# Patient Record
Sex: Female | Born: 1935 | Race: White | Hispanic: No | Smoking: Never smoker
Health system: Southern US, Community
[De-identification: ages and names within clinical notes are randomized; demographics above are authoritative.]

## PROBLEM LIST (undated history)

## (undated) DIAGNOSIS — R112 Nausea with vomiting, unspecified: Secondary | ICD-10-CM

## (undated) DIAGNOSIS — E6609 Other obesity due to excess calories: Secondary | ICD-10-CM

## (undated) DIAGNOSIS — I951 Orthostatic hypotension: Secondary | ICD-10-CM

## (undated) DIAGNOSIS — E079 Disorder of thyroid, unspecified: Secondary | ICD-10-CM

## (undated) DIAGNOSIS — E7800 Pure hypercholesterolemia, unspecified: Secondary | ICD-10-CM

## (undated) DIAGNOSIS — I442 Atrioventricular block, complete: Secondary | ICD-10-CM

## (undated) DIAGNOSIS — Z9889 Other specified postprocedural states: Secondary | ICD-10-CM

## (undated) DIAGNOSIS — C50919 Malignant neoplasm of unspecified site of unspecified female breast: Secondary | ICD-10-CM

## (undated) DIAGNOSIS — F22 Delusional disorders: Secondary | ICD-10-CM

## (undated) DIAGNOSIS — I872 Venous insufficiency (chronic) (peripheral): Secondary | ICD-10-CM

## (undated) HISTORY — DX: Disorder of thyroid, unspecified: E07.9

## (undated) HISTORY — DX: Malignant neoplasm of unspecified site of unspecified female breast: C50.919

## (undated) HISTORY — DX: Pure hypercholesterolemia, unspecified: E78.00

## (undated) HISTORY — PX: PACEMAKER INSERTION: SHX728

## (undated) HISTORY — DX: Other obesity due to excess calories: E66.09

## (undated) HISTORY — PX: CHOLECYSTECTOMY: SHX55

## (undated) HISTORY — PX: ABDOMINAL HYSTERECTOMY: SHX81

## (undated) HISTORY — DX: Venous insufficiency (chronic) (peripheral): I87.2

## (undated) HISTORY — DX: Atrioventricular block, complete: I44.2

## (undated) HISTORY — PX: CATARACT EXTRACTION: SUR2

## (undated) HISTORY — DX: Orthostatic hypotension: I95.1

---

## 2000-12-31 ENCOUNTER — Encounter (INDEPENDENT_AMBULATORY_CARE_PROVIDER_SITE_OTHER): Payer: Self-pay | Admitting: *Deleted

## 2000-12-31 ENCOUNTER — Ambulatory Visit (HOSPITAL_COMMUNITY): Admission: RE | Admit: 2000-12-31 | Discharge: 2000-12-31 | Payer: Self-pay | Admitting: Gastroenterology

## 2002-02-05 ENCOUNTER — Encounter: Payer: Self-pay | Admitting: Ophthalmology

## 2002-02-09 ENCOUNTER — Ambulatory Visit (HOSPITAL_COMMUNITY): Admission: RE | Admit: 2002-02-09 | Discharge: 2002-02-09 | Payer: Self-pay | Admitting: Ophthalmology

## 2004-03-06 ENCOUNTER — Ambulatory Visit (HOSPITAL_COMMUNITY): Admission: RE | Admit: 2004-03-06 | Discharge: 2004-03-06 | Payer: Self-pay | Admitting: Ophthalmology

## 2005-03-19 ENCOUNTER — Encounter: Admission: RE | Admit: 2005-03-19 | Discharge: 2005-03-19 | Payer: Self-pay | Admitting: Endocrinology

## 2005-04-16 ENCOUNTER — Encounter (INDEPENDENT_AMBULATORY_CARE_PROVIDER_SITE_OTHER): Payer: Self-pay | Admitting: Specialist

## 2005-04-17 ENCOUNTER — Inpatient Hospital Stay (HOSPITAL_COMMUNITY): Admission: RE | Admit: 2005-04-17 | Discharge: 2005-04-18 | Payer: Self-pay | Admitting: Surgery

## 2006-11-19 ENCOUNTER — Observation Stay (HOSPITAL_COMMUNITY): Admission: EM | Admit: 2006-11-19 | Discharge: 2006-11-20 | Payer: Self-pay | Admitting: Emergency Medicine

## 2007-03-30 DIAGNOSIS — C50919 Malignant neoplasm of unspecified site of unspecified female breast: Secondary | ICD-10-CM

## 2007-03-30 HISTORY — DX: Malignant neoplasm of unspecified site of unspecified female breast: C50.919

## 2007-04-03 ENCOUNTER — Encounter: Admission: RE | Admit: 2007-04-03 | Discharge: 2007-04-03 | Payer: Self-pay | Admitting: Endocrinology

## 2007-04-07 ENCOUNTER — Encounter: Admission: RE | Admit: 2007-04-07 | Discharge: 2007-04-07 | Payer: Self-pay | Admitting: Endocrinology

## 2007-04-07 ENCOUNTER — Encounter (INDEPENDENT_AMBULATORY_CARE_PROVIDER_SITE_OTHER): Payer: Self-pay | Admitting: Diagnostic Radiology

## 2007-04-17 ENCOUNTER — Encounter: Admission: RE | Admit: 2007-04-17 | Discharge: 2007-04-17 | Payer: Self-pay | Admitting: Endocrinology

## 2007-04-22 ENCOUNTER — Encounter (INDEPENDENT_AMBULATORY_CARE_PROVIDER_SITE_OTHER): Payer: Self-pay | Admitting: Diagnostic Radiology

## 2007-04-22 ENCOUNTER — Encounter: Admission: RE | Admit: 2007-04-22 | Discharge: 2007-04-22 | Payer: Self-pay | Admitting: Endocrinology

## 2007-04-27 ENCOUNTER — Encounter: Admission: RE | Admit: 2007-04-27 | Discharge: 2007-04-27 | Payer: Self-pay | Admitting: Surgery

## 2007-04-30 HISTORY — PX: BREAST LUMPECTOMY: SHX2

## 2007-04-30 HISTORY — PX: OTHER SURGICAL HISTORY: SHX169

## 2007-05-01 ENCOUNTER — Encounter: Admission: RE | Admit: 2007-05-01 | Discharge: 2007-05-01 | Payer: Self-pay | Admitting: Surgery

## 2007-05-05 ENCOUNTER — Encounter (INDEPENDENT_AMBULATORY_CARE_PROVIDER_SITE_OTHER): Payer: Self-pay | Admitting: Surgery

## 2007-05-05 ENCOUNTER — Ambulatory Visit (HOSPITAL_BASED_OUTPATIENT_CLINIC_OR_DEPARTMENT_OTHER): Admission: RE | Admit: 2007-05-05 | Discharge: 2007-05-05 | Payer: Self-pay | Admitting: Surgery

## 2007-05-05 ENCOUNTER — Encounter: Admission: RE | Admit: 2007-05-05 | Discharge: 2007-05-05 | Payer: Self-pay | Admitting: Surgery

## 2007-05-20 ENCOUNTER — Ambulatory Visit: Payer: Self-pay | Admitting: Oncology

## 2007-06-03 LAB — CBC WITH DIFFERENTIAL/PLATELET
BASO%: 0.8 % (ref 0.0–2.0)
Basophils Absolute: 0.1 10*3/uL (ref 0.0–0.1)
EOS%: 6.5 % (ref 0.0–7.0)
HCT: 43.3 % (ref 34.8–46.6)
HGB: 14 g/dL (ref 11.6–15.9)
MCH: 29.8 pg (ref 26.0–34.0)
MCHC: 32.3 g/dL (ref 32.0–36.0)
MONO#: 0.6 10*3/uL (ref 0.1–0.9)
NEUT%: 61.1 % (ref 39.6–76.8)
RDW: 13.9 % (ref 11.3–14.5)
WBC: 7.7 10*3/uL (ref 3.9–10.0)
lymph#: 1.9 10*3/uL (ref 0.9–3.3)

## 2007-06-03 LAB — COMPREHENSIVE METABOLIC PANEL
ALT: 11 U/L (ref 0–35)
AST: 14 U/L (ref 0–37)
Albumin: 4.3 g/dL (ref 3.5–5.2)
CO2: 27 mEq/L (ref 19–32)
Calcium: 9.8 mg/dL (ref 8.4–10.5)
Chloride: 104 mEq/L (ref 96–112)
Potassium: 3.9 mEq/L (ref 3.5–5.3)

## 2007-06-03 LAB — LACTATE DEHYDROGENASE: LDH: 170 U/L (ref 94–250)

## 2007-06-09 ENCOUNTER — Ambulatory Visit (HOSPITAL_COMMUNITY): Admission: RE | Admit: 2007-06-09 | Discharge: 2007-06-09 | Payer: Self-pay | Admitting: Oncology

## 2007-06-16 ENCOUNTER — Encounter: Admission: RE | Admit: 2007-06-16 | Discharge: 2007-06-16 | Payer: Self-pay | Admitting: Oncology

## 2007-06-17 ENCOUNTER — Encounter: Admission: RE | Admit: 2007-06-17 | Discharge: 2007-06-17 | Payer: Self-pay | Admitting: Endocrinology

## 2007-06-18 ENCOUNTER — Ambulatory Visit (HOSPITAL_BASED_OUTPATIENT_CLINIC_OR_DEPARTMENT_OTHER): Admission: RE | Admit: 2007-06-18 | Discharge: 2007-06-18 | Payer: Self-pay | Admitting: Surgery

## 2007-06-18 ENCOUNTER — Encounter (INDEPENDENT_AMBULATORY_CARE_PROVIDER_SITE_OTHER): Payer: Self-pay | Admitting: Surgery

## 2007-07-02 ENCOUNTER — Ambulatory Visit: Admission: RE | Admit: 2007-07-02 | Discharge: 2007-09-13 | Payer: Self-pay | Admitting: Radiation Oncology

## 2007-07-21 ENCOUNTER — Ambulatory Visit: Payer: Self-pay | Admitting: Oncology

## 2007-07-27 ENCOUNTER — Ambulatory Visit (HOSPITAL_COMMUNITY): Admission: RE | Admit: 2007-07-27 | Discharge: 2007-07-27 | Payer: Self-pay | Admitting: Oncology

## 2007-07-31 ENCOUNTER — Ambulatory Visit (HOSPITAL_COMMUNITY): Admission: RE | Admit: 2007-07-31 | Discharge: 2007-07-31 | Payer: Self-pay | Admitting: Oncology

## 2007-09-17 ENCOUNTER — Inpatient Hospital Stay (HOSPITAL_COMMUNITY): Admission: RE | Admit: 2007-09-17 | Discharge: 2007-09-23 | Payer: Self-pay | Admitting: Urology

## 2007-09-17 ENCOUNTER — Encounter (INDEPENDENT_AMBULATORY_CARE_PROVIDER_SITE_OTHER): Payer: Self-pay | Admitting: Urology

## 2007-09-18 ENCOUNTER — Ambulatory Visit: Payer: Self-pay | Admitting: Physical Medicine & Rehabilitation

## 2007-10-05 ENCOUNTER — Ambulatory Visit: Payer: Self-pay | Admitting: Oncology

## 2007-10-07 LAB — CBC WITH DIFFERENTIAL/PLATELET
Basophils Absolute: 0 10*3/uL (ref 0.0–0.1)
EOS%: 4 % (ref 0.0–7.0)
Eosinophils Absolute: 0.3 10*3/uL (ref 0.0–0.5)
HCT: 38.6 % (ref 34.8–46.6)
HGB: 12.9 g/dL (ref 11.6–15.9)
MCH: 30.4 pg (ref 26.0–34.0)
MONO#: 0.9 10*3/uL (ref 0.1–0.9)
NEUT#: 5.9 10*3/uL (ref 1.5–6.5)
NEUT%: 74.9 % (ref 39.6–76.8)
RDW: 14.1 % (ref 11.3–14.5)
WBC: 7.9 10*3/uL (ref 3.9–10.0)
lymph#: 0.8 10*3/uL — ABNORMAL LOW (ref 0.9–3.3)

## 2007-10-07 LAB — COMPREHENSIVE METABOLIC PANEL
AST: 11 U/L (ref 0–37)
Albumin: 3.5 g/dL (ref 3.5–5.2)
BUN: 16 mg/dL (ref 6–23)
CO2: 23 mEq/L (ref 19–32)
Calcium: 8.6 mg/dL (ref 8.4–10.5)
Chloride: 104 mEq/L (ref 96–112)
Creatinine, Ser: 1.16 mg/dL (ref 0.40–1.20)
Glucose, Bld: 101 mg/dL — ABNORMAL HIGH (ref 70–99)
Potassium: 4.3 mEq/L (ref 3.5–5.3)

## 2007-10-08 ENCOUNTER — Inpatient Hospital Stay (HOSPITAL_COMMUNITY): Admission: EM | Admit: 2007-10-08 | Discharge: 2007-10-14 | Payer: Self-pay | Admitting: Emergency Medicine

## 2007-10-20 ENCOUNTER — Observation Stay (HOSPITAL_COMMUNITY): Admission: AD | Admit: 2007-10-20 | Discharge: 2007-10-21 | Payer: Self-pay | Admitting: Cardiology

## 2007-11-12 LAB — CBC WITH DIFFERENTIAL/PLATELET
BASO%: 0.5 % (ref 0.0–2.0)
EOS%: 9.1 % — ABNORMAL HIGH (ref 0.0–7.0)
HCT: 39.2 % (ref 34.8–46.6)
MCH: 30.3 pg (ref 26.0–34.0)
MCHC: 33.4 g/dL (ref 32.0–36.0)
NEUT%: 62.5 % (ref 39.6–76.8)
RDW: 14.9 % — ABNORMAL HIGH (ref 11.3–14.5)
lymph#: 1.1 10*3/uL (ref 0.9–3.3)

## 2007-11-13 LAB — COMPREHENSIVE METABOLIC PANEL
ALT: 10 U/L (ref 0–35)
AST: 12 U/L (ref 0–37)
Alkaline Phosphatase: 91 U/L (ref 39–117)
Calcium: 8.8 mg/dL (ref 8.4–10.5)
Chloride: 108 mEq/L (ref 96–112)
Creatinine, Ser: 1.23 mg/dL — ABNORMAL HIGH (ref 0.40–1.20)

## 2007-11-13 LAB — VITAMIN D 25 HYDROXY (VIT D DEFICIENCY, FRACTURES): Vit D, 25-Hydroxy: 31 ng/mL (ref 30–89)

## 2008-01-15 ENCOUNTER — Ambulatory Visit: Payer: Self-pay | Admitting: Oncology

## 2008-01-19 LAB — CBC WITH DIFFERENTIAL/PLATELET
Basophils Absolute: 0 10*3/uL (ref 0.0–0.1)
EOS%: 5.8 % (ref 0.0–7.0)
HGB: 14.2 g/dL (ref 11.6–15.9)
MCH: 30.8 pg (ref 26.0–34.0)
MCV: 92 fL (ref 81.0–101.0)
MONO%: 6.4 % (ref 0.0–13.0)
NEUT%: 73.7 % (ref 39.6–76.8)
RDW: 14.2 % (ref 11.3–14.5)

## 2008-01-20 LAB — COMPREHENSIVE METABOLIC PANEL
AST: 11 U/L (ref 0–37)
Alkaline Phosphatase: 92 U/L (ref 39–117)
BUN: 20 mg/dL (ref 6–23)
Creatinine, Ser: 1.21 mg/dL — ABNORMAL HIGH (ref 0.40–1.20)
Total Bilirubin: 0.4 mg/dL (ref 0.3–1.2)

## 2008-01-20 LAB — CANCER ANTIGEN 27.29: CA 27.29: 15 U/mL (ref 0–39)

## 2008-01-20 LAB — VITAMIN D 25 HYDROXY (VIT D DEFICIENCY, FRACTURES): Vit D, 25-Hydroxy: 29 ng/mL — ABNORMAL LOW (ref 30–89)

## 2008-03-16 ENCOUNTER — Ambulatory Visit (HOSPITAL_COMMUNITY): Admission: RE | Admit: 2008-03-16 | Discharge: 2008-03-16 | Payer: Self-pay | Admitting: Urology

## 2008-04-05 ENCOUNTER — Encounter: Admission: RE | Admit: 2008-04-05 | Discharge: 2008-04-05 | Payer: Self-pay | Admitting: Oncology

## 2008-07-15 ENCOUNTER — Ambulatory Visit: Payer: Self-pay | Admitting: Oncology

## 2008-07-19 LAB — CBC WITH DIFFERENTIAL/PLATELET
Basophils Absolute: 0 10*3/uL (ref 0.0–0.1)
Eosinophils Absolute: 0.5 10*3/uL (ref 0.0–0.5)
HGB: 14.1 g/dL (ref 11.6–15.9)
MCV: 91.2 fL (ref 79.5–101.0)
MONO%: 8.2 % (ref 0.0–14.0)
NEUT#: 4.6 10*3/uL (ref 1.5–6.5)
RDW: 13.9 % (ref 11.2–14.5)

## 2008-07-20 LAB — COMPREHENSIVE METABOLIC PANEL
Albumin: 4.1 g/dL (ref 3.5–5.2)
Alkaline Phosphatase: 102 U/L (ref 39–117)
BUN: 19 mg/dL (ref 6–23)
Calcium: 9.7 mg/dL (ref 8.4–10.5)
Chloride: 106 mEq/L (ref 96–112)
Glucose, Bld: 99 mg/dL (ref 70–99)
Potassium: 4.3 mEq/L (ref 3.5–5.3)

## 2008-07-20 LAB — CANCER ANTIGEN 27.29: CA 27.29: 12 U/mL (ref 0–39)

## 2008-10-06 ENCOUNTER — Ambulatory Visit (HOSPITAL_COMMUNITY): Admission: RE | Admit: 2008-10-06 | Discharge: 2008-10-06 | Payer: Self-pay | Admitting: Urology

## 2008-10-06 ENCOUNTER — Encounter (INDEPENDENT_AMBULATORY_CARE_PROVIDER_SITE_OTHER): Payer: Self-pay | Admitting: Interventional Radiology

## 2009-01-26 ENCOUNTER — Ambulatory Visit: Payer: Self-pay | Admitting: Oncology

## 2009-01-30 LAB — CBC WITH DIFFERENTIAL/PLATELET
BASO%: 0.2 % (ref 0.0–2.0)
EOS%: 4.3 % (ref 0.0–7.0)
Eosinophils Absolute: 0.3 10*3/uL (ref 0.0–0.5)
LYMPH%: 15.9 % (ref 14.0–49.7)
MCHC: 33.8 g/dL (ref 31.5–36.0)
MCV: 94.8 fL (ref 79.5–101.0)
MONO%: 7.3 % (ref 0.0–14.0)
NEUT#: 5.8 10*3/uL (ref 1.5–6.5)
RBC: 4.36 10*6/uL (ref 3.70–5.45)
RDW: 13.6 % (ref 11.2–14.5)

## 2009-01-31 LAB — COMPREHENSIVE METABOLIC PANEL
ALT: 11 U/L (ref 0–35)
AST: 11 U/L (ref 0–37)
Albumin: 4.3 g/dL (ref 3.5–5.2)
Alkaline Phosphatase: 101 U/L (ref 39–117)
Potassium: 4.3 mEq/L (ref 3.5–5.3)
Sodium: 144 mEq/L (ref 135–145)
Total Bilirubin: 0.3 mg/dL (ref 0.3–1.2)
Total Protein: 7 g/dL (ref 6.0–8.3)

## 2009-01-31 LAB — CANCER ANTIGEN 27.29: CA 27.29: 9 U/mL (ref 0–39)

## 2009-03-21 ENCOUNTER — Ambulatory Visit: Payer: Self-pay | Admitting: Oncology

## 2009-03-27 LAB — VITAMIN D 25 HYDROXY (VIT D DEFICIENCY, FRACTURES): Vit D, 25-Hydroxy: 45 ng/mL (ref 30–89)

## 2009-04-10 ENCOUNTER — Encounter: Admission: RE | Admit: 2009-04-10 | Discharge: 2009-04-10 | Payer: Self-pay | Admitting: Oncology

## 2009-06-19 ENCOUNTER — Encounter: Admission: RE | Admit: 2009-06-19 | Discharge: 2009-06-19 | Payer: Self-pay | Admitting: Oncology

## 2009-08-04 ENCOUNTER — Ambulatory Visit: Payer: Self-pay | Admitting: Oncology

## 2009-08-08 LAB — COMPREHENSIVE METABOLIC PANEL
ALT: 11 U/L (ref 0–35)
BUN: 24 mg/dL — ABNORMAL HIGH (ref 6–23)
CO2: 23 mEq/L (ref 19–32)
Creatinine, Ser: 1.31 mg/dL — ABNORMAL HIGH (ref 0.40–1.20)
Total Bilirubin: 0.5 mg/dL (ref 0.3–1.2)

## 2009-08-08 LAB — CBC WITH DIFFERENTIAL/PLATELET
Eosinophils Absolute: 0.5 10*3/uL (ref 0.0–0.5)
LYMPH%: 17.6 % (ref 14.0–49.7)
MCHC: 34.1 g/dL (ref 31.5–36.0)
MCV: 95.7 fL (ref 79.5–101.0)
MONO%: 7.6 % (ref 0.0–14.0)
NEUT#: 5.4 10*3/uL (ref 1.5–6.5)
Platelets: 260 10*3/uL (ref 145–400)
RBC: 4.63 10*6/uL (ref 3.70–5.45)

## 2009-08-08 LAB — CANCER ANTIGEN 27.29: CA 27.29: 13 U/mL (ref 0–39)

## 2009-08-08 LAB — LACTATE DEHYDROGENASE: LDH: 140 U/L (ref 94–250)

## 2009-08-08 LAB — VITAMIN D 25 HYDROXY (VIT D DEFICIENCY, FRACTURES): Vit D, 25-Hydroxy: 83 ng/mL (ref 30–89)

## 2009-08-14 LAB — BASIC METABOLIC PANEL
Chloride: 104 mEq/L (ref 96–112)
Potassium: 4.1 mEq/L (ref 3.5–5.3)
Sodium: 141 mEq/L (ref 135–145)

## 2009-10-09 ENCOUNTER — Ambulatory Visit (HOSPITAL_COMMUNITY): Admission: RE | Admit: 2009-10-09 | Discharge: 2009-10-09 | Payer: Self-pay | Admitting: Urology

## 2009-12-08 ENCOUNTER — Ambulatory Visit: Payer: Self-pay | Admitting: Oncology

## 2010-02-16 ENCOUNTER — Ambulatory Visit: Payer: Self-pay | Admitting: Oncology

## 2010-02-20 LAB — CBC WITH DIFFERENTIAL/PLATELET
BASO%: 0.7 % (ref 0.0–2.0)
MCHC: 33.3 g/dL (ref 31.5–36.0)
MONO#: 0.5 10*3/uL (ref 0.1–0.9)
RBC: 4.53 10*6/uL (ref 3.70–5.45)
WBC: 8.2 10*3/uL (ref 3.9–10.3)
lymph#: 1.2 10*3/uL (ref 0.9–3.3)

## 2010-02-21 LAB — COMPREHENSIVE METABOLIC PANEL
ALT: 12 U/L (ref 0–35)
CO2: 25 mEq/L (ref 19–32)
Calcium: 10.8 mg/dL — ABNORMAL HIGH (ref 8.4–10.5)
Chloride: 104 mEq/L (ref 96–112)
Sodium: 143 mEq/L (ref 135–145)
Total Protein: 7.9 g/dL (ref 6.0–8.3)

## 2010-02-21 LAB — LACTATE DEHYDROGENASE: LDH: 138 U/L (ref 94–250)

## 2010-04-11 ENCOUNTER — Encounter
Admission: RE | Admit: 2010-04-11 | Discharge: 2010-04-11 | Payer: Self-pay | Source: Home / Self Care | Attending: Oncology | Admitting: Oncology

## 2010-08-06 LAB — CBC
HCT: 41.6 % (ref 36.0–46.0)
Hemoglobin: 14 g/dL (ref 12.0–15.0)
MCHC: 33.6 g/dL (ref 30.0–36.0)
MCV: 94.3 fL (ref 78.0–100.0)
RBC: 4.41 MIL/uL (ref 3.87–5.11)

## 2010-08-21 ENCOUNTER — Encounter (HOSPITAL_BASED_OUTPATIENT_CLINIC_OR_DEPARTMENT_OTHER): Payer: Medicare Other | Admitting: Oncology

## 2010-08-21 ENCOUNTER — Other Ambulatory Visit: Payer: Self-pay | Admitting: Oncology

## 2010-08-21 DIAGNOSIS — C50219 Malignant neoplasm of upper-inner quadrant of unspecified female breast: Secondary | ICD-10-CM

## 2010-08-21 DIAGNOSIS — C649 Malignant neoplasm of unspecified kidney, except renal pelvis: Secondary | ICD-10-CM

## 2010-08-21 DIAGNOSIS — M949 Disorder of cartilage, unspecified: Secondary | ICD-10-CM

## 2010-08-21 LAB — CBC WITH DIFFERENTIAL/PLATELET
BASO%: 0 % (ref 0.0–2.0)
Basophils Absolute: 0 10*3/uL (ref 0.0–0.1)
EOS%: 5.1 % (ref 0.0–7.0)
Eosinophils Absolute: 0.4 10*3/uL (ref 0.0–0.5)
HGB: 14.6 g/dL (ref 11.6–15.9)
MCV: 92.2 fL (ref 79.5–101.0)
MONO#: 0.5 10*3/uL (ref 0.1–0.9)
NEUT#: 5.7 10*3/uL (ref 1.5–6.5)
NEUT%: 72.2 % (ref 38.4–76.8)
Platelets: 234 10*3/uL (ref 145–400)
WBC: 7.9 10*3/uL (ref 3.9–10.3)

## 2010-08-22 LAB — COMPREHENSIVE METABOLIC PANEL
ALT: 11 U/L (ref 0–35)
AST: 12 U/L (ref 0–37)
Albumin: 4.4 g/dL (ref 3.5–5.2)
Alkaline Phosphatase: 86 U/L (ref 39–117)
BUN: 23 mg/dL (ref 6–23)
CO2: 25 mEq/L (ref 19–32)
Calcium: 10 mg/dL (ref 8.4–10.5)
Chloride: 104 mEq/L (ref 96–112)
Creatinine, Ser: 1.25 mg/dL — ABNORMAL HIGH (ref 0.40–1.20)
Glucose, Bld: 103 mg/dL — ABNORMAL HIGH (ref 70–99)
Potassium: 4.1 mEq/L (ref 3.5–5.3)
Sodium: 142 mEq/L (ref 135–145)
Total Bilirubin: 0.5 mg/dL (ref 0.3–1.2)
Total Protein: 7.5 g/dL (ref 6.0–8.3)

## 2010-08-28 ENCOUNTER — Other Ambulatory Visit: Payer: Self-pay | Admitting: Oncology

## 2010-08-28 ENCOUNTER — Encounter (HOSPITAL_BASED_OUTPATIENT_CLINIC_OR_DEPARTMENT_OTHER): Payer: Medicare Other | Admitting: Oncology

## 2010-08-28 DIAGNOSIS — M899 Disorder of bone, unspecified: Secondary | ICD-10-CM

## 2010-08-28 DIAGNOSIS — C50219 Malignant neoplasm of upper-inner quadrant of unspecified female breast: Secondary | ICD-10-CM

## 2010-08-28 DIAGNOSIS — Z853 Personal history of malignant neoplasm of breast: Secondary | ICD-10-CM

## 2010-09-11 NOTE — Discharge Summary (Signed)
[name], [name]              ACCOUNT NO.:  [id number]   MEDICAL RECORD NO.:  [id number]          PATIENT TYPE:  INP   LOCATION:  2034                         FACILITY:  [facility]   PHYSICIAN:  [name], M.D.DATE OF BIRTH:  19-Jan-1928   DATE OF ADMISSION:  10/20/2007  DATE OF DISCHARGE:  10/21/2007                               DISCHARGE SUMMARY   DISCHARGE DIAGNOSES:  1. Pacer site complication.  2. History of complete heart block, status post Medtronic pacemaker      implant October 09, 2007.  3. History of breast cancer, status post bilateral lumpectomy with      radiation therapy this spring.  4. History of renal cell cancer status post nephrectomy.  5. Treated hypothyroidism.   HOSPITAL COURSE:  The patient is a 77 year old female who presented with  complete heart block in October 08, 2007.  She was admitted with syncope.  She underwent Medtronic pacemaker implant by Dr. Lynnea [name] October 09, 2007,  that was uncomplicated.  She was seen in the office October 19, 2007, for  follow-up.  The patient has a history of recent renal cell cancer and  had a left nephrectomy Sep 17, 2007, and has also had  bilateral  lumpectomy and radiation therapy in April 2009 for breast cancer.  In  the office, she was doing well and had not complained of pain, swelling  or drainage from her pacer site.  When the Steri-Strips were removed in  the office, it was clear that the site was not healed yet.  The medial  aspect of the wound was still open and draining.  The site was redressed  and re-Steri-Stripped.  Dr. Lynnea [name] discussed this with Dr. Alanda [name].  It was decided to admit the patient for IV antibiotics.  This was done  October 20, 2007.  On October 21, 2007, Dr. Alanda [name] examined the wound,  redressed it, cleaned it, expressed some hematoma out and removed any  stitches that he could find in the medial aspect.  We will put her on  oral Keflex.  He will see her back in the office in 48 hours.  The  site  does not look infected.  We did send a wound culture.   DISCHARGE MEDICATIONS:  1. Synthroid 0.05 mg a day.  2. Aspirin 81 mg a day.  3. Protonix 40 mg a day.  4. Caltrate once a day.  5. Keflex 500 mg q.i.d.   LABS:  White count was 8.9.  The labs were within normal limits.   DISPOSITION:  The patient is discharged in stable condition and will  follow up with Dr. Alanda [name] in the office on Friday.      [name], P.A.       [name], M.D.  Electronically Signed    LKK/MEDQ  D:  10/21/2007  T:  10/22/2007  Job:  [id number]

## 2010-09-11 NOTE — Discharge Summary (Signed)
[name], [name]              ACCOUNT NO.:  [id number]   MEDICAL RECORD NO.:  [id number]          PATIENT TYPE:  INP   LOCATION:  3703                         FACILITY:  [facility]   PHYSICIAN:  Ritta Slot, MD     DATE OF BIRTH:  [birth year]   DATE OF ADMISSION:  10/08/2007  DATE OF DISCHARGE:  10/13/2007                               DISCHARGE SUMMARY   DISCHARGE DIAGNOSES:  1. Syncope, secondary to #2.  2. Sinus arrest/complete heart block; placement of a permanent      transvenous pacemaker Medtronic Adapta [id number], serial number      [id number].  3. History of left nephrectomy Sep 17, 2007, for renal cell carcinoma.  4. History of bilateral lumpectomy for breast cancer with radiation in      April 2009.  5. Hypothyroidism; previous history of hyperthyroidism status post      radioactive iodine in 2000.  6. History of hypertension.  7. Dyslipidemia.   DISCHARGE CONDITION:  Improving.   PROCEDURES:  1. October 08, 2007:  Placement of a temporary pacemaker wire by Dr.      Lynnea Ferrier in the right IJ.  2. October 09, 2007:  Permanent pacemaker implantation with a Medtronic      Adapta placed in VVIR mode.  This was done by Dr. Lynnea Ferrier.   One episode of tachycardia was noted October 10, 2007.   DISCHARGE MEDICATIONS:  1. Synthroid 50 mcg one daily.  2. Aspirin 81 mg daily.  3. Protonix 40 mg daily.  4. Caltrate one daily.   DISCHARGE DIET:  Low-sodium heart-healthy diet.   DISCHARGE INSTRUCTIONS:  1. Follow up with Dr. Lynnea Ferrier October 19, 2007 at 2 p.m. for pacer site      check.  2. No heavy lifting or vigorous activity with left arm for 6-8 weeks.      Do not raise your left arm above your head for 1 week and not very      far over your head for 1 month.  No driving for 2 weeks at least.  3. Keep the wound area clean and dry.  Do not get the area wet for 1      week.  No showers for 1 week.  You may shower October 17, 2007.  The      Steri-Strips would fall off, do not pull them  off.  No bandage is      needed at the site.  Do not apply creams, oils or ointments.  If      there is any drainage or discharge please call Dr. Donavan Burnet      office.  4. Please note you can use cell phones, just use the opposite ear.      When traveling through airports, use the hand wand to be evaluated      instead of the metal detectors.  No MRI.  No arc welding equipment      and no TENS units.  5. Microwave ovens are safe.   Please note, the patient should also have physical therapy and  occupational therapy.  Okay to use left  arm for walker, just not  vigorous activity with the left arm and no raising it above the head.   HISTORY OF PRESENT ILLNESS:  A 77 year old female was brought to the  emergency room October 08, 2007, after ambulating from her couch to the  bathroom and back to the couch.  She felt funny, went to lean over and  rest her head, but had complete loss of consciousness and slid to the  floor.  This was witnessed by her husband.  The loss of consciousness  was just seconds but then the loss of consciousness would reoccur.  Her  husband could not lift her.  Neighbor came over and called EMS.  EMS  documented sinus arrest, a pause of 12.4 seconds, then return to  complete heart block with wide QRS followed by 11.8 second sinus arrest  and complete heart block.  External pacing pads were applied and the  patient was paced until arrival in the emergency room, the pacing was  stopped.  Initially, sinus rhythm with left bundle-branch block.  Her  left bundle-branch block is old.  She was alert and oriented.  Denies  chest pain or shortness of breath.  She had had a previous syncopal  episode in July 2008.  Workup was negative at that time.  Her Maxzide  had been discontinued back in 2008 as well.  She was then admitted to  the CCU unit for further monitoring.  Pacing pads were applied again to  use as a backup if she had further problems.  Temporary wire was placed  in  the right IJ by Dr. Lynnea Ferrier the night of admission.  The patient  maintained stability.   PAST MEDICAL HISTORY:  1. Hypertension.  2. Recent bilateral lumpectomy for breast cancer with radiation of      April 2009.  3. Recent status post left nephrectomy for renal cell carcinoma Sep 17, 2007.  4. She is hypothyroid but with a history of hyperthyroid with      radioactive iodine in 2000.  5. She has history of dyslipidemia.  6. Hemorrhoids.   SOCIAL HISTORY:  Married, no tobacco, no alcohol use.   FAMILY HISTORY REVIEW OF SYSTEMS:  See H&P.   PHYSICAL EXAM PRIOR TO DISCHARGE:  Blood pressure 117/63, pulse 84,  respirations 20, temperature 98.5, oxygen saturation room air was 90%,  had been as high as 96%.  The pacing site is stable.  No erythema.  Lungs were clear to auscultation.  Heart sounds S1, S2, regular rate and  rhythm.   LABORATORY DATA:  Admitting labs:  Hemoglobin 12.6, hematocrit 37.9, WBC  10.2, platelets 366.  Prior to discharge, hemoglobin 12, hematocrit  35.7, WBC 7.3, platelets 349.  Chemistry on admission:  Sodium 139,  potassium 4.3, chloride 104, CO2 26, BUN 18, creatinine 1.19, glucose  108.  Prior to discharge these were stable.  Creatinine was up slightly  at 1.22.  Otherwise, sodium 138, potassium 3.7, chloride 106, CO2 27,  BUN 15 and glucose 89.  Coags:  Pro time 13.1, INR of 1, PTT 25.   Cardiac markers were negative for MI.  CKs range 27-58.  MBs negative at  0.8-1.1.  Troponin I 0.01-0.07.   Electrolytes:  Calcium 8.1- 8.6.  TSH was 5.390.   Chest x-ray on admission:  Cardiac enlargement without heart failure.  Follow-up after the pacemaker:  Single lead transvenous pacer in  appropriate position.  No evidence of pneumothorax.  Stable  cardiomegaly.  Increased bibasilar atelectasis.   HOSPITAL COURSE:  The patient was admitted after syncope due to  basically asystole and complete heart block.  She had external pacer  pads on.  Once she  arrived at [facility] she went to the catheterization  laboratory and had a temporary pacemaker placed.  She was stable with  the pacemaker until the next morning.  She underwent permanent  transvenous pacemaker, VVIR leads.  Tolerated the procedure well.   She continued to improve, though due to all her medical comorbidities  with recent radiation therapy, recent nephrectomy for renal cancer, the  patient was felt she needed rehab and plans are to send her to [facility] if she can get a bed there.  By June 15 she is stable, she  can ambulate with physical therapy, okay to use a walker.  Mainly, we do  not want the arm above her head, and the walker is better balance for  her then using a cane only.   The patient will continue in the hospital until bed is available.      [name], N.P.      Ritta Slot, MD  Electronically Signed    LRI/MEDQ  D:  10/12/2007  T:  10/12/2007  Job:  [id number]   cc:   Alfonse Alpers. Dagoberto Ligas, M.D.  Copy to nursing home with the patient.  Ritta Slot, MD

## 2010-09-11 NOTE — Op Note (Signed)
[name], [name]              ACCOUNT NO.:  [id number]   MEDICAL RECORD NO.:  [id number]          PATIENT TYPE:  INP   LOCATION:  0002                         FACILITY:  [facility]   PHYSICIAN:  Heloise Purpura, MD      DATE OF BIRTH:  09/29/1927   DATE OF PROCEDURE:  09/17/2007  DATE OF DISCHARGE:                               OPERATIVE REPORT   PREOPERATIVE DIAGNOSIS:  Left renal mass.   POSTOPERATIVE DIAGNOSIS:  Left renal mass.   PROCEDURE:  Left laparoscopic radical nephrectomy.   SURGEON:  Heloise Purpura, MD.   ASSISTANT:  Dr. Bjorn Pippin.   ANESTHESIA:  General.   COMPLICATIONS:  None.   ESTIMATED BLOOD LOSS:  Less than 100 mL.   SPECIMENS:  Left kidney and proximal ureter.   DISPOSITION OF SPECIMENS:  To pathology.   INTRAOPERATIVE FINDINGS:  Intraoperative pathology had demonstrated  findings consistent with a renal cell carcinoma.   INDICATIONS:  [name] is a 77 year old female with an incidentally  detected large left renal mass that was found to be enhancing and  concerning for a renal cell carcinoma.  Her mass was centrally located  and did raise a concern for a possible urothelial malignancy, although a  renal cell carcinoma was felt to be the likely diagnosis.  She had  undergone a full metastatic evaluation which was negative and after  discussion regarding management options for treatment, elected to  proceed with the above procedure.  Potential risks, benefits,  complications, and alternative treatment options were discussed in  detail and informed consent was obtained.   DESCRIPTION OF PROCEDURE:  The patient was taken to the operating room  and a general anesthetic was administered.  She was given preoperative  antibiotics, placed in the left modified flank position, and prepped and  draped in the usual sterile fashion.  Care had been taken to pad all  potential pressure points.  A preoperative time-out was performed.  A  site was then selected  well lateral of the umbilicus and just superior  to the level of the umbilicus due to the patient's morbid obesity.  This  was used as a site for entry into the peritoneal cavity using an open  Hassan technique.  A small 1 cm transverse incision was made in the skin  and carried down through the subcutaneous tissues.  The anterior rectus  fascia was identified and divided and the underlying rectus muscle  fibers were spread laterally and medially.  The posterior sheath was  then sharply divided allowing entry into the peritoneal cavity under  direct vision and without difficulty.  The 0 Vicryl fascial holding  sutures were placed and the 10-mm Hassan cannula was secured with these  sutures.  A pneumoperitoneum was established and a 30 degree lens was  used to inspect the abdomen.  There was no evidence of any intra-  abdominal injuries or other abnormalities.  The remaining ports were  then placed.  A 12 mm port was placed in the left lower quadrant.  A 5  mm port was placed in left upper quadrant and a 5  mm port was placed in  the far left lateral abdominal wall.  All ports were placed under direct  vision without difficulty.  With the aid of the Harmonic scalpel, the  white line of Toldt was incised along the length of the descending colon  allowing the medial colon to be mobilized medially and the space between  the mesocolon and the anterior layer of Gerota's fascia to be developed.  This space was developed until the ureter and gonadal vein were  identified and able to be lifted anteriorly off the psoas muscle.  Dissection proceeded superiorly until the renal vein was identified and  cleared off of the overlying adventitial tissue.  The aorta was able to  be identified and a solitary renal artery was visualized and isolated.  This was consistent with the patient's preoperative CT scan imaging.  The renal artery was then dissected free from the surrounding tissue and  isolated with  right-angle dissection.  Multiple 10 mm Hem-o-lok clips  were then placed onto the renal artery to ligate it.  It was divided  between these clips.  Attention then turned to the renal vein which also  was able to be isolated with right angle dissection and 15 mm Hem-o-lok  clips were used to ligate the renal vein lateral to the adrenal vein.  Three Hem-o-lok clips were placed on the inferior vena cava side with  one clip placed on the kidney side.  The renal vein was then sharply  divided and Gerota's fascia was intentionally entered superiorly  allowing the adrenal gland to be spared.  The Harmonic scalpel was used  to take down the fatty tissue between the superior pole of the kidney  and the adrenal gland.  Dissection proceeded superiorly and the spleen  was mobilized off the kidney superiorly allowing the superior  attachments to be taken down adequately.  The lateral fascial  attachments to the abdominal wall were then incised with the Harmonic  scalpel until the lateral aspect of the kidney was completely freed.  The ureter was then isolated and divided between Hem-o-lok clips.  The  gonadal vein was similarly isolated and divided between Hem-o-lok clips.  Once all remaining fatty attachments to the kidney were removed and the  kidney was freely mobile, the 15 mm EndoCatch II bag was placed through  the left lower quadrant port site after the 12 mm port was removed.  The  kidney was placed into the bag and due to the large size of the  specimen, the bag was unable to be completely closed.  This port site  was then extended laterally and the bag was able to be brought out of  this incision with the specimen within.  It was then removed intact  within the EndoCatch II bag and the specimen was able to be completely  removed.  It was then sent for intraoperative pathologic analysis which  demonstrated findings consistent with a renal cell carcinoma.  The  incision where the kidney was  removed was then closed in two layers with  a #1 PDS suture.  The pneumoperitoneum was then reestablished and the  renal fossa was examined.  There was no evidence of any bleeding from  the renal hilum, spleen, or gonadal vessels.  The renal fossa was  copiously irrigated and attention then turned to closure of the port  sites.  A 5 mm camera was then used to examine the original Hassan  cannula which was closed with a figure-of-eight 0  Vicryl suture placed  with the aid of the suture passer device.  This was reinforced with the  previously placed 0 Vicryl sutures which were used to hold the [facility]  cannula.  The remaining ports were then removed under direct vision and  the pneumoperitoneum was expelled.  The incisions were then injected  with 25% Marcaine and reapproximated at the skin level with staples.  Sterile dressings were applied.  The patient appeared to tolerate  procedure well without complications.  She was able to be extubated and  transferred to the recovery unit in satisfactory condition.      Heloise Purpura, MD  Electronically Signed     LB/MEDQ  D:  09/17/2007  T:  09/17/2007  Job:  [id number]

## 2010-09-11 NOTE — Op Note (Signed)
[name], [name]              ACCOUNT NO.:  [id number]   MEDICAL RECORD NO.:  [id number]          PATIENT TYPE:  AMB   LOCATION:  DSC                          FACILITY:  [facility]   PHYSICIAN:  Thomas A. Cornett, M.D.DATE OF BIRTH:  December 31, 1927   DATE OF PROCEDURE:  05/05/2007  DATE OF DISCHARGE:                               OPERATIVE REPORT   PREOPERATIVE DIAGNOSIS:  Bilateral breast cancer.   POSTOPERATIVE DIAGNOSIS:  Bilateral breast cancer.   PROCEDURES:  1. Bilateral needle localized breast lumpectomy.  2. Bilateral sentinel lymph node mapping with injection of methylene      blue dye.   SURGEON:  Maisie Fus A. Cornett, M.D.   ANESTHESIA:  General endotracheal anesthesia and 0.25% Sensorcaine.   ESTIMATED BLOOD LOSS:  50 mL.   DRAINS:  None.   SPECIMENS:  1. Left axillary sentinel lymph nodes.  Right axillary sentinel lymph      nodes, both negative by touch prep.  Right breast lumpectomy were      radiogram reveals that there are calcifications under the nipple      that were not in the specimen.  2. Left breast lumpectomy.  Specimen appears adequate grossly.   BRIEF HISTORY:  The patient is a 77 year old female who was diagnosed  with bilateral breast cancers.  She underwent core biopsy which showed  disease involving both breasts, both in the medial quadrants and then  subareolar.  She wished to preserve her breast, underwent a bracketed  bilateral needle localizations.  Also there were clips placed in some  calcifications in the subareolar position on the right.   DESCRIPTION OF PROCEDURE:  After undergoing needle localization in both  breasts by the radiologist and injection of technetium sulfur colloid,  she was brought to the operating room.  General anesthesia was done and  both breasts were prepped and draped in sterile fashion.  I started with  her right breast.  We used a separate set-up to do both axillary nodes.  On the right side, a NeoProbe was used and  incision was made in the  right axilla.  I dissected into the right axilla.  Of note, I injected  both subareolar areas with 4 mL of methylene blue dye and massaged each.  After doing so, we then used the NeoProbe.  The right axilla was  identified.  Incision was made.  Dissection was carried into the right  axilla.  There were four sentinel nodes in the right axilla that were  hot and blue.  In a similar fashion, the left axilla was approached.  We  then made an incision in the left axilla using a NeoProbe was got [place]  down into four sentinel nodes on the left side.  These were examined  with touch prep and found to be negative for metastatic disease.  Hemostasis was excellent.  We then did the right breast lumpectomy.  A  medial incision was made where the exiting wires were.  There were three  wires.  We took tissue out circumferentially around all three wires and  then scraped up under the nipple, leaving just  the skin of the nipple  behind and I then took this laterally in the right breast and had a very  large lumpectomy cavity.  The radiograph revealed that there  calcifications up under the nipple that were not in the specimen, but  unfortunately the only thing left behind was the nipple itself.  I did  not wish to remove the nipple until I had final pathological  confirmation that this indeed was involved in this process versus being  just fibrocystic changes which have been noted in her previous core  biopsies.  I went ahead and closed the lumpectomy cavity with 3-0 Vicryl  and 4-0 Monocryl.  We then used a different set-up for the left breast  lumpectomy.  Incision was made in a similar fashion in the medial left  breast where the exiting wires were.  The tissue where there were two  wires was excised up under the nipple as well.  This area of tissue was  then sent for radiography.  The wires were removed in their entirety.  After doing this, I closed this wound in a similar  fashion with 3-0  Vicryl and 4-0 Monocryl.  The axilla were closed in a similar fashion as  well with 3-0 Vicryl and 4-0 Monocryl.  I applied Dermabond to all of  the incisions.  All final counts of sponge, needle and instruments were  found to be correct at this portion of the case.  The patient was then  awoke, taken to recovery in satisfactory condition.      Thomas A. Cornett, M.D.  Electronically Signed     TAC/MEDQ  D:  05/05/2007  T:  05/05/2007  Job:  [id number]   cc:   [name], M.D.

## 2010-09-11 NOTE — Discharge Summary (Signed)
[name], [name]              ACCOUNT NO.:  [id number]   MEDICAL RECORD NO.:  [id number]          PATIENT TYPE:  OBV   LOCATION:  4705                         FACILITY:  [facility]   PHYSICIAN:  Alfonse Alpers. Gegick, M.D.DATE OF BIRTH:  01-05-1928   DATE OF ADMISSION:  11/19/2006  DATE OF DISCHARGE:  11/20/2006                               DISCHARGE SUMMARY   HISTORY:  This is a 77 year old woman who presents with an episode of  syncope.  The patient has a history of hypertension for which she is  taking Maxzide.  She also has a history of hypothyroidism which is  compensated by Synthroid.  Recently she had been found to have a urinary  tract infection and she was treated with Septra.  She has been doing  reasonably well without any symptoms until the day of this admission.  She awoke in the morning feeling well and when she turned over she had a  sensation of feeling bad and then she had a syncopal episode.  She did  not have any tonic-clonic components to this and she fell and contused  her nose.  She was brought to the emergency room and in the emergency  room she was evaluated and found to have a fractured nose.  She has not  had any previous episodes of syncope.  She was evaluated in the  emergency room and she felt well without any symptoms.  Her physical  examination revealed a well-developed woman in no distress.  Her lungs  were clear.  Cardiovascular examination was normal.  Neuromuscular exam  was normal.  There was no evidence of weakness on either side.  Speech  was normal, mentation was normal and muscle strength was normal.  She  was then admitted to the hospital for observation.   IMPRESSION ON ADMISSION:  1. Fracture of the nose.  2. Syncopal episodes.  3. History of hypertension.  4. Hypothyroidism (compensated).   HOSPITAL COURSE:  She was admitted to the hospital and monitored.  No  arrhythmias occurred.  She also had orthostatic pressures done and these  were  negative.  She feels well and is doing well and will be discharged.   IMPRESSION ON DISCHARGE:  1. Syncopal episode, etiology undetermined.  2. Fracture of the nose.  3. Hypothyroidism (compensated).  4. History of hypertension.   MEDICATIONS ON DISCHARGE:  She will continue taking her thyroid.  The  Maxzide that she was taking will be discontinued and the Septra that she  was taking will be continued.   As an outpatient she will have an MRI brain scan for the possibility  that this was a small lacunar stroke.  She was advised to discontinue  aspirin at this time by the ear, nose and throat surgeon.   PLAN OF FOLLOW-UP:  She will be seen in the office in a period of one  week.   CONDITION ON DISCHARGE:  Improved.           ______________________________  Alfonse Alpers Dagoberto Ligas, M.D.     CGG/MEDQ  D:  11/20/2006  T:  11/20/2006  Job:  [id number] 

## 2010-09-11 NOTE — Discharge Summary (Signed)
[name], [name]              ACCOUNT NO.:  [id number]   MEDICAL RECORD NO.:  [id number]          PATIENT TYPE:  INP   LOCATION:  1428                         FACILITY:  [facility]   PHYSICIAN:  Heloise Purpura, MD      DATE OF BIRTH:  03-25-1928   DATE OF ADMISSION:  09/17/2007  DATE OF DISCHARGE:  09/23/2007                               DISCHARGE SUMMARY   ADMISSION DIAGNOSIS:  Left renal mass.   DISCHARGE DIAGNOSIS:  Renal cell carcinoma.   HISTORY AND PHYSICAL:  For full details, please see admission history  and physical.  Briefly, [name] is  77 year old female who was  incidentally found to have a large left renal mass concerning for renal  cell carcinoma.  She also was recently diagnosed with breast cancer and  has recently undergone radiation treatment.  She underwent a metastatic  evaluation, which was negative, and after discussing management options  for treatment, elected to proceed with a laparoscopic radical  nephrectomy.   HOSPITAL COURSE:  On Sep 17, 2007, the patient was taken to the  operating room and underwent a left laparoscopic radical nephrectomy.  She tolerated this procedure well without complications.  Postoperatively, she was able to be transferred to a regular hospital  room following recovery from anesthesia.  She was monitored closely and  on postoperative day one, remained hemodynamically stable.  Her  hematocrit was checked and found to be stable at 40.1.  Her renal  function was monitored and remained stable throughout her hospital  course with her serum creatinine at 1.15.  She did have difficulty  ambulating and a physical therapy consult was obtained.  She was able to  gradually increase her activity, and it was recommended that she undergo  home health physical therapy upon discharge.  Her diet was gradually  advanced as tolerated until she was able to tolerate a regular diet.  She was gradually transitioned to oral pain medication.  She  finally had  return of bowel function and on postoperative day five, was felt to be  stable for discharge with her home health needs arranged.  However, she  developed lower abdominal pain and the inability to void.  Although she  was able to void small amounts, a post void residual demonstrated over  600 cc in her bladder.  She underwent in and out catheterization and  1200 cc were returned.  She also had a urinalysis checked, which did  indicate a concern for  a urinary tract infection.  She was therefore  placed on empiric antibiotic therapy with ciprofloxacin and was given  another chance to void that evening.  She was unable to void and an  indwelling Foley catheter was placed.  On postoperative day six, she was  therefore discharged home with an indwelling Foley catheter and home  health nursing care for assistance.   FOLLOWUP:  She will plan to follow up next week for a voiding trial and  removal of her staples.   PATHOLOGY RESULTS:  The patient's pathology did return during her  hospitalization.  This demonstrated a [id number] Furman grade 2  clear cell  renal cell carcinoma with negative surgical margins.  This result as  well its implications were discussed with the patient during her  hospitalization.   DISPOSITION:  Home with home health nursing care and physical therapy.   DISCHARGE INSTRUCTIONS:  She was instructed to resume her diet as before  surgery.  She was told to gradually advance her activity as tolerated  without engaging in any heavy lifting or strenuous activity.  She was  told to refrain from driving.   DISCHARGE MEDICATIONS:  She was instructed to resume her regular home  medications.  She was given prescriptions to take Darvocet as needed for  pain, Colace as a stool softener, and to continue ciprofloxacin until  her followup next week.  Urine culture is pending at the time of this  dictation.      Heloise Purpura, MD  Electronically Signed      LB/MEDQ  D:  09/23/2007  T:  09/23/2007  Job:  [id number]

## 2010-09-11 NOTE — H&P (Signed)
[name], [name]              ACCOUNT NO.:  [id number]   MEDICAL RECORD NO.:  [id number]          PATIENT TYPE:  OBV   LOCATION:  4705                         FACILITY:  [facility]   PHYSICIAN:  Alfonse Alpers. Gegick, M.D.DATE OF BIRTH:  1928-03-03   DATE OF ADMISSION:  11/19/2006  DATE OF DISCHARGE:                              HISTORY & PHYSICAL   CHIEF COMPLAINT:  This is a 77 year old woman who presents with a  history of syncope.   HISTORY OF PRESENT ILLNESS:  The patient has been in a usual state of  health.  She was seen in the office 24 hours earlier.  At that time she  had a mildly symptomatic urinary tract infection.  She was started on  Septra DS, which she has taken in the past.  She this morning turned  while in bed to grab something and then she felt something that made her  uneasy, something was wrong according to her.  She felt things get dark  and then she had a complete syncopal episode.  She fell and hit her nose  and presented to the emergency room.   PAST MEDICAL HISTORY:  1. She has had a history of hypertension, but this was controlled.      Her last blood pressure the day before was 120/84.  2. She also has a history of hyperthyroidism that was treated with      radioactive iodine in the year 2000 and now is hypothyroid but is      compensated.  3. She has a history of dyslipidemia.  4. Previously had some blood from hemorrhoids and had colonoscopy.   Her medications prior to this admission include.  1. Synthroid at 0.05 mg one daily.  2. Maxzide 25 one daily.  3. Detrol LA one h.s.  4. Septra DS one b.i.d.   FAMILY HISTORY:  Noncontributory.   PERSONAL HISTORY:  She does not smoke or drink excessive amounts of  alcohol.   She is to allergic to or has idiosyncratic reactions to CODEINE and  MACROBID.   REVIEW OF SYSTEMS:  Her weight has been stable.  CARDIOVASCULAR/RESPIRATORY:  No history of chest pain.  GI: See above.  GU: No complaints other than  that noted above.   PHYSICAL EXAM:  GENERAL:  This is a well-developed woman who appears in  no distress at this time.  She feels perfectly fine.  HEAD:  Normocephalic except for the obvious fracture of the nose.  LUNGS:  Clear.  CARDIOVASCULAR:  Rhythm is regular.  ABDOMEN:  Soft.  No masses are present.  EXTREMITIES:  No pedal edema.  NEUROMUSCULAR:  Essentially normal.  She moves all four extremities.  Good strength in all four extremities.  There was no mentation change.   IMPRESSION:  1. Syncopal episode, etiology to be determined.  2. Fracture of the nose.  3. Hypothyroidism (compensated).  4. History of hypertension.  5. History of dyslipidemia.           ______________________________  Alfonse Alpers. Dagoberto Ligas, M.D.     CGG/MEDQ  D:  11/19/2006  T:  11/19/2006  Job:  [id number]

## 2010-09-11 NOTE — Cardiovascular Report (Signed)
NAMEKENDELL, GAMMON              ACCOUNT NO.:  [id number]   MEDICAL RECORD NO.:  [id number]          PATIENT TYPE:  INP   LOCATION:  2903                         FACILITY:  [facility]   PHYSICIAN:  Ritta Slot, MD     DATE OF BIRTH:  Sep 18, 1927   DATE OF PROCEDURE:  DATE OF DISCHARGE:                            CARDIAC CATHETERIZATION   SINGLE-CHAMBER PACEMAKER INSERTION.   INDICATIONS:  Complete heart block.   ATTENDING PHYSICIAN:  Ritta Slot, MD.   PROCEDURE:  This is insertion of a Medtronic Adapta ADSRO1 serial #PWM  [id number] with active ventricular lead serial #BBL [id number].   INDICATIONS:  Ms. Emeri Estill is a very pleasant 77 year old  Caucasian female who was admitted to [facility] in the early  hours of this morning with a 12-second pause and complete heart block.  She required a temporary pacemaker insertion during the night and was  brought today for attempt at the dual-chamber pacemaker insertion for  sinus rhythm with complete heart block.   DESCRIPTION OF OPERATION:  After informed consent, the patient's left  anterior chest was shaved and prepped and draped in sterile fashion.  EC  monitoring was established.  Local anesthetic use with lidocaine 1% at  30 mL infiltrated around the left mid subclavicular area.  Next,  approximately 3-cm mid subclavicular incision was then carried down to  the left pectoral fascia and again hemostasis was obtained with  electrocautery.  Next, approximately 2 x 3-cm pocket was created over  the left pectoral fascia.  The left subclavian vein was then entered  with 1 retained guidewire.  Venography was performed prior to this to  locate the subclavian vein.  Over the retained guidewire, a 9-French  dilator sheath was then easily entered and the dilator was then removed.  Through the sheath, a 58-cm active Medtronic lead, model #4076, serial  #BBL [id number], was then easily passed into the right atrium.  Peel-away  sheath  was then removed.  A second 7-French sheath was then inserted  over the retained guidewire.  The guidewire and dilator was then  removed.  Through this, an atrial lead was then passed through 7-French  and placed into the right atrium.  A J curve was then placed in  ventricular lead and  ventricular lead was allowed to cross the  tricuspid valve and positioned in the RVA apex without difficulty.  We  were able to capture 2 volts and screws were then extended and  thresholds determined.  R-waves measured 8.7 mV, impedance 831 ohms,  thresholds 0.64 volts at 0.5 milliseconds, and current 0.9 mA.  The  preformed J lead was then inserted in the atrial lead. The atrial lead  was then used to map the right atrium.  The J lead continuously tended  to prolapse across the tricuspid valve into the right ventricle.  After  multiple attempts at attempting to place the RA lead into the right  atrium, success was never obtained placing the RA into the right atrial  appendage.  The active atrial lead was then exchanged out and a third  stick was then  attempted into the left subclavian vein.  This was  successful and a retained guidewire was then placed into the left  subclavian vein.  A second 7-French sheath was then placed over the  retained guidewire and the dilator and guidewire were removed.  A tined  atrial lead was then attempted to be inserted into the left subclavian  vein through the 7-French sheath.  Significant resistance was obtained  while trying to pass the retained atrial tined leads.  The lead and  dilator were then removed and any attempt to further place the an atrial  lead was abandoned.  We then decided to go ahead and insert single-  chamber pacemaker for complete heart block.  The pocket was then  irrigated with 1% Garamycin solution.  The ventricular lead, model  #4076, BBL [id number] was then connected to an Adapta ADSRO1 serial #PWM  [id number].  Head screws were tightened and pacing  was established.  Single  suture was placed at the apex of pocket.  The generator and lead was  then delivered to the pocket.  Header was secured with silk suture.  The  subcutaneous layer was then closed with 2-0 Vicryl.  The skin was then  closed in 4-0 Vicryl.  Steri-Strips were applied.  The patient was  transferred to the recovery room in stable condition.   COMPLICATIONS:  None.   CONCLUSIONS:  1. Successful implant of a Medtronic Adapta single-chamber ADSRO1 with      serial #PWM [id number] with active ventricular lead.  2. Abandoned attempt at placement of atrial lead due to inability to      find suitable position in the right atrium with multiple episodes      of difficulty.      Ritta Slot, MD  Electronically Signed     HS/MEDQ  D:  10/09/2007  T:  10/10/2007  Job:  [id number]

## 2010-09-11 NOTE — Op Note (Signed)
[name], VALCARCEL              ACCOUNT NO.:  [id number]   MEDICAL RECORD NO.:  [id number]          PATIENT TYPE:  AMB   LOCATION:  DSC                          FACILITY:  [facility]   PHYSICIAN:  Thomas A. Cornett, M.D.DATE OF BIRTH:  [birth year]   DATE OF PROCEDURE:  06/18/2007  DATE OF DISCHARGE:                               OPERATIVE REPORT   PREOPERATIVE DIAGNOSIS:  Left breast cancer status post lumpectomy with  positive margin.   POSTOPERATIVE DIAGNOSIS:  Left breast cancer status post lumpectomy with  positive margin.   PROCEDURE:  Re-excision left breast lumpectomy with excision of left  nipple.   SURGEON:  Maisie Fus A. Cornett, M.D.   ANESTHESIA:  LMA with 0.25% Sensorcaine local.   ESTIMATED BLOOD LOSS:  20 mL.   SPECIMEN:  Left breast tissue including medial margin and deep margin of  left breast lumpectomy cavity and left nipple.   BRIEF HISTORY:  The patient is a 77 year old female with bilateral  breast cancer.  She underwent bilateral lumpectomies, but unfortunately  her left breast lumpectomy involved the nipple.  She wished to continue  with breast conserving measures and presents today for re-excision of  her left breast lumpectomy site for a negative margin which will include  the nipple.   DESCRIPTION OF PROCEDURE:  The patient was brought to the operating room  and placed supine.  After induction of LMA anesthesia, the left breast  was prepped and draped in sterile fashion.   The medially placed left breast incision was localized.  The medial part  of this was excised, and the entire nipple was excised laterally.  The  entire margin up under the nipple with skin was involved.  We took the  dissection to involve the lumpectomy cavity.  The superficial medial  soft tissue margin beneath the skin was excised as well.  The specimen  was sent to pathology for further evaluation.  Once we excised the  superficial medial portion of this, including the nipple,  the cavity was  evaluated.  It was irrigated and then closed in layers.  We were able to  get the majority of the lumpectomy cavity out, especially the medial  superficial region that was felt to be positive.  We also took the  nipple and took a little more of a lateral margin as well with it so we  had a nice wide margin.  Closure of  the wound in layers was done.  4-0 Monocryl was used to close the skin.  Dermabond was applied to the incision.  All final counts of sponge,  needle, and instruments were found to be correct at this portion of the  case.   The patient was then taken to the recovery room in satisfactory  condition.      Thomas A. Cornett, M.D.  Electronically Signed     TAC/MEDQ  D:  06/18/2007  T:  06/18/2007  Job:  [id number]   cc:   Pierce Crane, MD

## 2010-09-14 NOTE — Discharge Summary (Signed)
NAMELORIBETH, KATICH              ACCOUNT NO.:  [id number]   MEDICAL RECORD NO.:  [id number]          PATIENT TYPE:  INP   LOCATION:  1608                         FACILITY:  [facility]   PHYSICIAN:  Clovis Pu. Cornett, M.D.DATE OF BIRTH:  05-Apr-1928   DATE OF ADMISSION:  04/16/2005  DATE OF DISCHARGE:  04/18/2005                                 DISCHARGE SUMMARY   ADMISSION DIAGNOSIS:  Cholelithiasis.   DISCHARGE DIAGNOSES:  Cholelithiasis and choledocholithiasis.   PROCEDURES:  1.  Laparoscopic cholecystectomy with intraoperative cholangiogram.  2.  Postoperative endoscopic retrograde cholangiopancreatography with      sphincterotomy.   BRIEF HISTORY:  The patient is a 77 year old female admitted on April 16, 2005, due to biliary colic.  She underwent laparoscopic cholecystectomy with  intraoperative cholangiogram and was found to have common bile duct stone.   Postoperative course: The patient was admitted to the hospital  postoperatively.  GI consultation with [facility] was obtained.  She underwent  ERCP on April 17, 2005  .  She was doing well postoperatively.  Her diet  was advanced.  On postop day #2, she had no pain.  Liver function studies  remained normal.  Total bilirubin on discharge was 1.3 and alkaline  phosphatase was 355.  AST and ALT were 76 and 71, respectively.  White count  was normal discharge at 5500, hemoglobin 12.  Vital signs were stable.  Wound was soft and nontender.  Jackson-Pratt drain that had been placed was  removed on postoperative day #2 with minimal drainage.  She was discharged  home in satisfactory condition at this point.   DISCHARGE INSTRUCTIONS:  1.  She will follow up with me in 2 to 3 weeks.  2.  She will resume her preoperative medications and diet as before.  3.  She will be discharged home on Vicodin and Phenergan as needed for pain      and nausea.  4.  GI will let her know if they need followup with her.   CONDITION ON DISCHARGE:   Satisfactory.      Thomas A. Cornett, M.D.  Electronically Signed     TAC/MEDQ  D:  04/18/2005  T:  04/21/2005  Job:  [id number]

## 2010-09-14 NOTE — Op Note (Signed)
NAMEDAVINITY, FANARA              ACCOUNT NO.:  [id number]   MEDICAL RECORD NO.:  [id number]          PATIENT TYPE:  OIB   LOCATION:  NA                           FACILITY:  [facility]   PHYSICIAN:  Guadelupe Sabin, M.D.DATE OF BIRTH:  1927-12-29   DATE OF PROCEDURE:  03/06/2004  DATE OF DISCHARGE:                                 OPERATIVE REPORT   PREOPERATIVE DIAGNOSIS:  Senile nuclear cataract, left eye.   POSTOPERATIVE DIAGNOSIS:  Senile nuclear cataract, left eye.   OPERATION:  Planned extracapsular cataract extraction, phacoemulsification,  primary insertion of posterior chamber intraocular lens implant.   SURGEON:  Guadelupe Sabin, M.D.   ASSISTANT:  Nurse.   ANESTHESIA:  Local 4% Xylocaine, 0.75% Marcaine retrobulbar block with  Wydase added, topical Tetracaine, intraocular Xylocaine.  Anesthesia stand-  by required in this elderly patient.  Patient given sodium pentothal  intravenously during the period of retrobulbar blocking.   OPERATIVE PROCEDURE:  After the patient was prepped and draped, a lid  speculum was inserted in the left eye.  The eye was turned downward and a  superior rectus traction suture placed.  Schiotz tonometry was recorded at 7  scale units with a 5.5 g weight, indicating a normal intraocular pressure.  A peritomy was performed adjacent to the limbus from the 11 to 1 o'clock  position.  The corneoscleral junction was cleaned and a corneoscleral groove  made with a 45 degree Superblade.  The anterior chamber was then entered  with the 2.5 mm diamond keratome at the 12 o'clock position and the 15  degree blade at the 2:30 position. Using a bent 26-gauge needle on an  Occucoat syringe, a circular capsulorhexis was begun and then completed with  the Grabow forceps.  Hydrodissection and hydrodelineation were performed  using 1% Xylocaine.  The 30 degree phacoemulsification tip was then inserted  with slow, controlled emulsification of the rather soft  nucleus.  Total  ultrasonic time 59 seconds, average power level 12%, total amount of fluid  used 80 mL.  Following removal of the nucleus, the residual cortex was  aspirated with the irrigation-aspiration tip.  The posterior capsule  appeared intact with a brilliant red fundus reflex.  It was therefore  elected to insert an [employer] [id number] silicone three-piece  posterior chamber intraocular lens implant.  Diopter strength +21.50.  This  was inserted with the McDonald forceps into the anterior chamber and then  centered into the capsular bag using the [facility] lens rotator.  The lens  appeared to be well-centered.  The Occucoat and Provisc which had been used  intermittently during the procedure was aspirated and replaced with balanced  salt solution and Miochol ophthalmic solution.  The operative incisions  appeared to be self-sealing.  It was elected, however, to place a single  interrupted 10-0 nylon suture across the 12 o'clock incision to ensure  closure and  to prevent endophthalmitis.  Maxitrol ointment was instilled in the  conjunctival cul-de-sac and a light patch and protective shield applied.  Duration of procedure and anesthesia administration 45 minutes.  The patient  tolerated  the procedure well in general, left the operating room for the  recovery room in good condition.       HNJ/MEDQ  D:  03/06/2004  T:  03/06/2004  Job:  [id number]

## 2010-09-14 NOTE — Op Note (Signed)
[name], [name]              ACCOUNT NO.:  [id number]   MEDICAL RECORD NO.:  [id number]          PATIENT TYPE:  AMB   LOCATION:  DAY                          FACILITY:  [facility]   PHYSICIAN:  Thomas A. Cornett, M.D.DATE OF BIRTH:  October 09, 1927   DATE OF PROCEDURE:  04/16/2005  DATE OF DISCHARGE:                                 OPERATIVE REPORT   PREOPERATIVE DIAGNOSIS:  Cholelithiasis.   POSTOPERATIVE DIAGNOSIS:  Cholelithiasis, symptomatic with common bile duct  stone.   PROCEDURE:  Laparoscopic cholecystectomy with intraoperative angiogram.   SURGEON:  Dr. Harriette Bouillon   ANESTHESIA:  General endotracheal anesthesia,   ASSISTANT:  Dr. Dominga Ferry   ESTIMATED BLOOD LOSS:  20 mL.   DRAIN:  One 19-French round Blake drain to right gallbladder fossa.   INTRAOPERATIVE FINDINGS:  Dilated common duct with opacification of the  cystic duct and bifurcation of right and left ducts.  There is a meniscus  sign noted distal common duct at the ampulla with what appears to be a  filling defect with no free flow of contrast into the duodenum.   INDICATIONS FOR PROCEDURE:  The patient is a 77 year old female, who has had  symptomatic cholelithiasis off and on for the last number of months.  It has  gotten worse recently, and she is brought today to the operating room for  laparoscopic cholecystectomy with intraoperative cholangiogram due to  worsening cholelithiasis.  Of note, she had dilated common duct and has had  transient rises in her liver function studies.   DESCRIPTION OF PROCEDURE:  The patient was brought to the operating suite  and placed supine.  After induction of general endotracheal anesthesia, the  abdomen is prepped and draped in a sterile fashion.  A 1 cm supraumbilical  incision was made, and dissection was carried down to her fascia.  The  fascia was grasped, and a small incision was made in the fascia.  Both sides  of the fascia were held with Kochers, and I placed  a Kelly clamp through the  peritoneal lining into the abdominal cavity without difficulty.  I placed my  finger and swept around and felt some lower abdominal wall adhesions to the  omentum just below the umbilicus.  A pursestring suture of 0 Vicryl was then  placed, and then 12 mm Hasson cannula was placed under direct vision.  After  this was done, the pneumoperitoneum was created to 15 mmHg of CO2.  The  patient was placed in reverse Trendelenburg and rolled to her left.  Laparoscopy was performed.  There were some adhesions just below the  umbilicus of omentum.  No evidence of bowel involvement.  Next a 5 mm port  was placed in the subxiphoid position under direct vision.  Two of the 5-mm  ports were placed in the right mid abdomen.  The gallbladder was identified  and grasped by its dome and retracted toward the patient's right shoulder.  Some omental adhesions were taken down with cautery to better expose the  infundibulum of the gallbladder.  There were some chronic signs of  inflammatory change noted  to the gallbladder grossly.  The infundibulum was  then grasped with a second grasper and pulled to the patient's right lower  quadrant.  I was able to dissect out the cystic duct.  It was dialed to  least 5 mm.  I dissected this out circumferentially.  I had to exchange the  5 mm port for a 10 mm port using a 5 mm clip that did not quite get across  the cystic duct.  We exchanged this under direct vision.  A 10 mm clip  applier was then used, and I a second clip at the junction of the cystic  duct and infundibulum.  Incision was made in the cystic duct.  Through a  separate stab incision, a Cook cholangiogram catheter was introduced and  placed in the cystic duct and secured with a clip.  Intraoperative  cholangiogram with one-half strength Hypaque dye was used.  There was prompt  filling of the cystic duct and hepatic ducts, both right and left and  common.  There was flow down the  common duct until the ampulla, where there  was a meniscus filling defect worrisome for a common duct stone.  There was  no flow of contrast through the sphincter of Oddi into the duodenum after  two attempts.  At this point in time, I removed the cholangiogram catheter  and placed 3 clips across the cystic duct stump.  At this point,  I divided  the cystic duct in its completion.  Next, cystic artery was identified.  It  was dissected out and double clipped and divided.  A posterior branch was  also identified, clipped, and divided.  There was some oozing from the  gallbladder bed during this part of the procedure, but cautery was used to  control that.  Hook cautery was used to dissect the gallbladder from the  gallbladder until the dome was reached.  Using an EndoCatch bag, I placed  the camera and the 10 mm port at the xiphoid and extracted it with an  EndoCatch bag through the umbilicus and passed it off the field.  At this  point in time, there was considerable oozing from the gallbladder bed, and I  controlled this with cautery and Surgicel with a satisfactory response.  The  clips appeared to be on the cystic duct and cystic artery branches  adequately.  There was no bile, and this was suctioned out with 2 liters of  saline until clear.  At this point in time, I elected to place a 53 Blake  drain given the common duct stone in case the clips leaked at the cystic  duct.  This was placed under direct vision in the gallbladder fossa without  difficulty.  This was secured with a 3-0 nylon.  This was then placed to  bulb suction.  At this point in time, all excess irrigation was suctioned  out, and I examined the abdominal cavity and saw no signs of bowel injury or  other solid or hollow organ injury.  At this point, the CO2 was released,  and all ports were subsequently removed.  There was no evidence of port site bleeding.  The Hassan cannula was then removed with the camera, and the  CO2  was released.  The umbilical fascial defect was closed with a pursestring  suture previously placed, and 4-0 Monocryl was used to close all skin  incisions.  Steri-Strips and dry dressings were applied.  All final counts  of sponge, needle,  and instruments were found to be correct at this portion  of the case.  The patient was then awoke and taken to recovery in  satisfactory condition.  A GI consultation will be obtained postop for  consideration of ERCP.  All final counts of sponge, needle, and instruments  were found to be correct.      Thomas A. Cornett, M.D.  Electronically Signed     TAC/MEDQ  D:  04/16/2005  T:  04/17/2005  Job:  [id number]   cc:   Alfonse Alpers. Dagoberto Ligas, M.D.  Fax: [phone]

## 2010-09-14 NOTE — H&P (Signed)
   NAME:  [name], [name]                        ACCOUNT NO.:  [id number]   MEDICAL RECORD NO.:  [id number]                   PATIENT TYPE:  OIB   LOCATION:  2876                                 FACILITY:  [facility]   PHYSICIAN:  Guadelupe Sabin, M.D.             DATE OF BIRTH:  June 12, 1927   DATE OF ADMISSION:  02/09/2002  DATE OF DISCHARGE:  02/09/2002                                HISTORY & PHYSICAL   REASON FOR ADMISSION:  This was a planned outpatient surgical admission of  this 77 year old white female admitted for cataract implant surgery of the  right eye.   HISTORY OF PRESENT ILLNESS:  This patient was first seen in my office on  December 14, 2001. The patient stated that  she had  cataracts in both eyes  and would require cataract surgery. She had been wearing monovision contact  lens correction in the past, but vision had deteriorated and she complained  of blurred smoky vision, dim vision,  difficulty with road signs,  television, reading, night vision, bright sunlight vision and difficulty  seeing to do her hobbies. She signed an informed consent and arrangements  were made for her outpatient admission.   PAST MEDICAL HISTORY:  The patient is under the care of Dr. Corrin Parker.  Dr. Dagoberto Ligas feels the patient is in satisfactory condition for the proposed  surgery. Dr. Dagoberto Ligas notes the previous underlying problems of hypertension,  hyperthyroidism, menopause, dyslipidemia.   REVIEW OF SYSTEMS:  No cardiorespiratory complaints.   PHYSICAL EXAMINATION:  The patient is a pleasant, well nourished, well  developed 78 year old white female in no acute distress. Visual acuity with  best correction 20/50 right eye, 20/60- left eye. Applanation tonometry  normal. Slit lamp examination bilateral anterior subcapsular and nuclear and  slight posterior subcapsular cataract change in both eyes. Fundus  examination, dilated, reveals cataract haze  greater in the right than left  eye.  The vitreous is clear. The retina is attached with slight vitreous  degeneration. The blood vessels and macula appear normal. CHEST:  Lungs  clear to auscultation and percussion.  HEART:  Normal sinus rhythm, no cardiomegaly, no murmurs.  ABDOMEN: Negative.  EXTREMITIES:  Negative.   ADMISSION DIAGNOSIS:  Senile cataract, right eye.   SURGICAL PLAN:  Cataract implant surgery, right eye, now, left eye later as  needed.                                               Guadelupe Sabin, M.D.   HNJ/MEDQ  D:  02/09/2002  T:  02/10/2002  Job:  [id number]   cc:   Alfonse Alpers. Dagoberto Ligas, M.D.  [address]  [place]  [place] [address]  Fax: [phone]

## 2010-09-14 NOTE — Op Note (Signed)
NAME:  [name], [name]                        ACCOUNT NO.:  [id number]   MEDICAL RECORD NO.:  [id number]                   PATIENT TYPE:  OIB   LOCATION:  2876                                 FACILITY:  [facility]   PHYSICIAN:  Guadelupe Sabin, M.D.             DATE OF BIRTH:  December 26, 1927   DATE OF PROCEDURE:  02/09/2002  DATE OF DISCHARGE:  02/09/2002                                 OPERATIVE REPORT   PREOPERATIVE DIAGNOSIS:  Senile nuclear and anterior subcapsular cataract,  right eye.   POSTOPERATIVE DIAGNOSIS:  Senile nuclear and anterior subcapsular cataract,  right eye.   OPERATION:  Planned  extracapsular cataract extraction, phacoemulsification,  primary insertion of posterior chamber intraocular lens implant.   SURGEON:  Guadelupe Sabin, M.D.   ASSISTANT:  Nurse.   ANESTHESIA:  Local 4% Xylocaine, 0.75 Marcaine, retrobulbar block, topical  Tetracaine, intraocular Xylocaine. Anesthesia standby required, the patient  was given sodium Pentothal  intravenously during the period of retrobulbar  blocking.   DESCRIPTION OF PROCEDURE:  After the patient was prepped and draped, a lid  speculum was inserted in the right eye. The eye was  turned downward and a  superior rectus traction suture was placed. Tonometry was recorded at 7  scale units with a 5.5 gm weight.   A peritomy was performed adjacent to the limbus from the 11 to 1 o'clock  position. The corneal scleral junction was cleaned, and a corneal scleral  groove was made with a 45 degree Superblade. The anterior chamber was then  entered with the 2.5 mm diamond keratome at the 12 o'clock position and the  15 degree blade at the 2:30 position. Using  a bent 26 gauge needle on a  Healon syringe, a circular capsulorrhexis was begun, and then  completed  with the Grabow forceps. Hydrodissection and hydrodelineation were performed  using 1% Xylocaine.   A 30 degree phacoemulsification tip was then inserted with slow  controlled  emulsification of the lens nucleus with fragmentation with the Bechert pick.  Total ultrasonic time was 1 minute and 3 seconds. Average power level was  15%. Total amount of fluid used was 30 cc.   Following removal of the nucleus, the residual cortex was aspirated with  the irrigation aspiration tip. The posterior capsule appeared intact to the  brilliant noted fundus reflex and it was elected to insert an [employer] SI40 NB 3 piece silicone posterior chamber intraocular lens  implant, diopter strength +21.50. This was inserted with the McDonald  forceps into the anterior chamber and then centered into the capsular bag  using the [facility] lens rotator. The lens appeared to be well centered. The  Healon which had been used throughout the procedure was aspirated and  replaced with balanced salt solution and Miochol ophthalmic solution. The  operative incisions appeared to be self sealing and no sutures were  required. Maxitrol ointment was instilled in  the conjunctival  cul-de-sac  and a light patch and protector shield applied.   Duration of the procedure and anesthesia administration 45 minutes. The  patient tolerated the procedure well in general. She left the operating room  for the recovery room in good condition.                                                Guadelupe Sabin, M.D.    HNJ/MEDQ  D:  02/09/2002  T:  02/10/2002  Job:  [id number]

## 2010-09-14 NOTE — Op Note (Signed)
[name], [name]              ACCOUNT NO.:  [id number]   MEDICAL RECORD NO.:  [id number]          PATIENT TYPE:  OIB   LOCATION:  0454                         FACILITY:  [facility]   PHYSICIAN:  Petra Kuba, M.D.    DATE OF BIRTH:  1928-03-31   DATE OF PROCEDURE:  02/15/2005  DATE OF DISCHARGE:                                 OPERATIVE REPORT   PROCEDURE:  ERCP, sphincterotomy, stone extraction.   INDICATIONS:  CBD stone on intraoperative cholangiogram, increased liver  tests.  Consent was signed after risks, benefits, methods, options  thoroughly discussed by Dr. Matthias Hughs last night with both the patient and her  husband and me prior to any sedation with the patient.   MEDICINES USED:  Fentanyl 100, Versed 7.   PROCEDURE:  The side-viewing therapeutic video duodenoscope was inserted by  indirect vision into the stomach, advanced through a normal antrum into the  duodenum, and the periampullary area was brought into view, pertinent for a  normal-appearing ampulla with a moderate sized diverticula.  Using the  triple-lumen sphincterotome, we were able to get selective cannulation the  Jag wire was advanced into the intrahepatics, and deep selective cannulation  was obtained. No PD injections were done.  The CBD stone was confirmed; the  ducts were not overinflated.  She did have a dilated CBD.  It was a moderate-  sized stone.  We went ahead and proceeded with a moderate-sized  sphincterotomy, limited by the overlying fold and the diverticula until we  were able to get the fully bowed sphincterotome easily in and out the duct.  We went ahead and proceeded with 2 balloon pull throughs using the  adjustable balloon set at the 12 mm mark.  On the first pass, some debris  was removed and on the second pass, both the stone and debris was removed.  The stone was moderate.  Unfortunately, we popped the balloon with this  maneuver.  Based based on their. Still probably being more debris  and stone  particles in the duct and now having a better appearance of the ampulla, we  increased her sphincterotomy over the Jag wire in the customary fashion.  Throughout the procedure, no PD injections or PD cannulation is were done  and once this first sphincterotomy was done, we were able to get deep  selective cannulation at all junctions with either balloon or sphincterotome  without using the wire, although the sphincterotomy was done over the wire,  and it was increased a moderate amount until adequate biliary drainage was  seen.  We proceeded with 2 more 12 mm balloon pull throughs.  There was some  debris on the first, minimal on the second.  We then proceeded with a normal  occlusion cholangiogram which did not reveal any further stone or debris or  any other abnormalities.  The procedure was terminated at this junction.  Scope was removed.  The patient tolerated the procedure well.  There was no  obvious immediate complication.   ENDOSCOPIC DIAGNOSES:  1.  Moderate periampullary diverticula.  2.  No pancreatic duct injections  3.  Moderate distal common bile duct stones and a dilated common bile duct.  4.  Status post sphincterotomy x2.  5.  Multiple 12 mm balloon pull throughs with a moderate size stone and      moderate amount of debris being removed, moderate difficulty and      resistance in removing the balloon on the first 2 attempts but much      easier on subsequent ones after the increased sphincterotomy, not      mentioned above.  6.  Negative occlusion cholangiogram at the end of the procedure.   PLAN:  Observe for delayed complications.  If none, hopefully home tomorrow,  daily slowly advance her diet.  Follow liver tests, etc.  Please let me know  if I can help.           ______________________________  Petra Kuba, M.D.     MEM/MEDQ  D:  04/17/2005  T:  04/19/2005  Job:  [id number]   cc:   Pablo Ledger, M.D.  Fax: [phone]   Alfonse Alpers. Dagoberto Ligas,  M.D.  Fax: [phone]

## 2010-09-14 NOTE — Consult Note (Signed)
[name], [name]              ACCOUNT NO.:  [id number]   MEDICAL RECORD NO.:  [id number]          PATIENT TYPE:  OIB   LOCATION:  0098                         FACILITY:  [facility]   PHYSICIAN:  Bernette Redbird, M.D.   DATE OF BIRTH:  [birth year]   DATE OF CONSULTATION:  04/16/2005  DATE OF DISCHARGE:                                   CONSULTATION   Dr. Luisa Hart asked [place] to see this 77 year old female because of  choledocholithiasis.   Ms. Vary underwent a laparoscopic cholecystectomy by Dr. Luisa Hart today,  during which an intraoperative cholangiogram was obtained, which showed a  meniscus in the distal duct with complete obstruction of flow into the  duodenum and with a roughly 5-7 mm filling defect and a somewhat dilated  duct, probably measuring about 10 mm across.   The patient's preoperative liver chemistries were elevated with total  bilirubin of 2.3, alk phos 444, AST 229, ALT 159.  Her white count was  normal as of yesterday.   PAST MEDICAL HISTORY:  Allergy to ERYTHROMYCIN.   OUTPATIENT MEDICATIONS:  1.  Maxzide 37.5/25 1/2 q.a.m.  2.  Synthroid 50 mcg q.a.m.  3.  Caltrate 1 q.a.m.  4.  Phenergan 1 tablet p.r.n. nausea.  5.  Prevacid 30 mg daily.  6.  Rewetting eye drops.   OPERATIONS:  Include previous hysterectomy and above-mentioned laparoscopic  cholecystectomy today.   MEDICAL ILLNESSES:  Include hypertension and hyperthyroidism, under  treatment.  I believe she is now hypothyroid.  No known cardiopulmonary  disease or diabetes.   HABITS:  Nonsmoker and nondrinker.   FAMILY HISTORY:  Not obtained.   SOCIAL HISTORY:  Married.   REVIEW OF SYSTEMS:  Not obtained.   PHYSICAL EXAMINATION:  GENERAL:  The patient is somnolent in the recovery  room at this time but seems rational and coherent.  HEENT:  She is anicteric.  CHEST:  Clear anteriorly.  HEART:  Normal.  ABDOMEN:  Quite adipose and soft but somewhat tender.   LABS:  See above.   Cholangiogram  reviewed.  Agree with interpretation.  It appears there is a  high probability of a common duct stone being present, based on the  radiograph findings.   IMPRESSION:  Choledocholithiasis characterized by abnormal intraoperative  cholangiogram and elevated liver chemistries.   PLAN:  I believe the patient would be well served to undergo ERCP with  sphincterotomy and stone extraction tomorrow.  She does not show signs of  septic cholangitis or need for emergent intervention tonight.  She is  uncomfortable at this time, but it is hard to sort out postoperative pain  from pain that might be arising from choledocholithiasis.   I will attempt to reach the patient's husband by telephone tonight to  explain the procedure to him in some detail.  I have already explained the  nature, purpose, and risks of the procedure with the patient herself.  Tentatively, we will plan for it to be done tomorrow.           ______________________________  Bernette Redbird, M.D.     RB/MEDQ  D:  04/16/2005  T:  04/18/2005  Job:  [id number]   cc:   Alfonse Alpers. Dagoberto Ligas, M.D.  Fax: [phone]   Clovis Pu. Cornett, M.D.  [address]  [place] [address]

## 2010-09-14 NOTE — H&P (Signed)
NAMEKINZLIE, HARNEY              ACCOUNT NO.:  [id number]   MEDICAL RECORD NO.:  [id number]          PATIENT TYPE:  OIB   LOCATION:  NA                           FACILITY:  [facility]   PHYSICIAN:  Guadelupe Sabin, M.D.DATE OF BIRTH:  Nov 02, 1927   DATE OF ADMISSION:  03/06/2004  DATE OF DISCHARGE:                                HISTORY & PHYSICAL   HISTORY OF PRESENT ILLNESS:  This was a planned outpatient surgical  admission of this 77 year old, white female admitted for cataract implant  surgery of the left eye.  This patient has noted deterioration and vision in  both eyes due to progressive cataract formation.  She was previously  admitted to this hospital on February 09, 2002, and underwent uncomplicated  cataract surgery of the right eye.  The patient did well postoperatively  with return of vision to 20/20.  Recently, the unoperated eye has  deteriorated to 20/40 to 20/50.  The patient has elected to proceed with  similar cataract implant surgery noting blurred, smoky and dim vision,  difficulty with road signs, television, reading, night vision, bright sun  light vision, difficulty doing her housework or hobbies and colors looked  dim in the left eye.  She was given oral discussion and printed information  concerning the procedure and its possible complications.  She signed an  informed consent and arrangements were made for her outpatient admission.   PAST MEDICAL HISTORY:  The patient is under the care of Dr. Corrin Parker.  Dr. Dagoberto Ligas notes the patient's underlying medical problems of hypertension  and hyperthyroidism and now hypothyroid.   MEDICATIONS:  Synthroid, Maxzide, calcium, Valium, Desyrel, Mobic and Oxy-  Trol.   REVIEW OF SYSTEMS:  No cardiorespiratory complaints.   ALLERGIES:  ERYTHROMYCIN.   PHYSICAL EXAMINATION:  VITAL SIGNS:  As recorded on admission, blood  pressure 139/87, pulse 61, respirations 16, temperature 987.1.  GENERAL:  The patient is a  pleasant, well-developed, well-nourished, 76-year-  old, white female in no acute distress.  HEENT:  Visual acuity as noted above.  The eyes are white and clear with  nuclear cataract formation in the left eye.  The right eye shows a clear,  posterior chamber intraocular lens implant.  Dilated fundus examination  shows a clear vitreous, attached retina with normal optic nerve blood  vessels and maculae.  CHEST:  Lungs clear to auscultation and percussion.  HEART:  Normal sinus rhythm.  No cardiomegaly.  No murmurs.  ABDOMEN:  Negative.  EXTREMITIES:  Negative.   ASSESSMENT:  1.  Senile nuclear cataract, left eye.  2.  Pseudophakia, right eye.   PLAN:  Cataract implant surgery, left eye.       HNJ/MEDQ  D:  03/06/2004  T:  03/06/2004  Job:  [id number]   cc:   Alfonse Alpers. Dagoberto Ligas, M.D.  [address]  [place]  [place] [address]  Fax: [phone]

## 2010-10-23 ENCOUNTER — Inpatient Hospital Stay (INDEPENDENT_AMBULATORY_CARE_PROVIDER_SITE_OTHER)
Admission: RE | Admit: 2010-10-23 | Discharge: 2010-10-23 | Disposition: A | Discharge status: Home / Self Care | Payer: Medicare Other | Source: Ambulatory Visit | Attending: Emergency Medicine | Admitting: Emergency Medicine

## 2010-10-23 DIAGNOSIS — T6391XA Toxic effect of contact with unspecified venomous animal, accidental (unintentional), initial encounter: Secondary | ICD-10-CM

## 2011-01-16 LAB — DIFFERENTIAL
Eosinophils Relative: 4
Lymphocytes Relative: 22
Lymphs Abs: 1.7
Monocytes Absolute: 0.5

## 2011-01-16 LAB — CBC
HCT: 40.9
Hemoglobin: 14.1
RDW: 14.3
WBC: 7.8

## 2011-01-16 LAB — BASIC METABOLIC PANEL
Chloride: 108
GFR calc non Af Amer: 57 — ABNORMAL LOW
Glucose, Bld: 94
Potassium: 3.8
Sodium: 142

## 2011-01-23 LAB — CBC
Hemoglobin: 13.6
MCHC: 33
MCV: 91.8
MCV: 92.2
Platelets: 202
RBC: 4.05
RBC: 4.5
WBC: 9

## 2011-01-23 LAB — BASIC METABOLIC PANEL
BUN: 12
CO2: 23
CO2: 27
CO2: 28
Calcium: 8.7
Calcium: 8.9
Chloride: 105
Chloride: 108
Creatinine, Ser: 1.15
GFR calc Af Amer: 55 — ABNORMAL LOW
GFR calc Af Amer: 55 — ABNORMAL LOW
GFR calc Af Amer: >60
GFR calc Af Amer: >60
GFR calc non Af Amer: 46 — ABNORMAL LOW
GFR calc non Af Amer: 52 — ABNORMAL LOW
Glucose, Bld: 122 — ABNORMAL HIGH
Potassium: 3.8
Sodium: 140
Sodium: 141
Sodium: 143

## 2011-01-23 LAB — URINALYSIS, ROUTINE W REFLEX MICROSCOPIC
Glucose, UA: NEGATIVE
Nitrite: NEGATIVE
Specific Gravity, Urine: 1.02
pH: 6

## 2011-01-23 LAB — URINE MICROSCOPIC-ADD ON

## 2011-01-23 LAB — HEMOGLOBIN AND HEMATOCRIT, BLOOD
HCT: 40.3
HCT: 42.6
Hemoglobin: 13.4

## 2011-01-23 LAB — TYPE AND SCREEN: Antibody Screen: NEGATIVE

## 2011-01-24 LAB — POCT I-STAT, CHEM 8
Calcium, Ion: 0.91 — ABNORMAL LOW
Chloride: 110
Creatinine, Ser: 1.5 — ABNORMAL HIGH
Glucose, Bld: 111 — ABNORMAL HIGH
HCT: 41
Hemoglobin: 13.9
Potassium: 7.9

## 2011-01-24 LAB — CK TOTAL AND CKMB (NOT AT ARMC)
CK, MB: 0.8
CK, MB: 1.8
Relative Index: INVALID
Total CK: 40

## 2011-01-24 LAB — CBC
HCT: 35.7 — ABNORMAL LOW
HCT: 38
Hemoglobin: 11.9 — ABNORMAL LOW
Hemoglobin: 12.2
Hemoglobin: 12.6
Hemoglobin: 12
Hemoglobin: 12.8
MCHC: 33.2
MCHC: 34.1
MCHC: 34.4
MCHC: 33.6
MCV: 93.3
MCV: 92.4
Platelets: 319
Platelets: 366
RBC: 3.7 — ABNORMAL LOW
RBC: 3.83 — ABNORMAL LOW
RDW: 13.6
RDW: 13.7
RDW: 13.7
RDW: 14.1
RDW: 13.8
WBC: 9
WBC: 7.3

## 2011-01-24 LAB — BASIC METABOLIC PANEL
BUN: 12
BUN: 18
BUN: 16
CO2: 22
CO2: 25
CO2: 26
CO2: 26
Calcium: 8.1 — ABNORMAL LOW
Calcium: 8.2 — ABNORMAL LOW
Calcium: 8.4
Calcium: 8.6
Chloride: 105
Chloride: 107
Creatinine, Ser: 1
Creatinine, Ser: 1.11
Creatinine, Ser: 1.14
GFR calc Af Amer: 56 — ABNORMAL LOW
GFR calc Af Amer: 52 — ABNORMAL LOW
GFR calc non Af Amer: 46 — ABNORMAL LOW
GFR calc non Af Amer: 54 — ABNORMAL LOW
GFR calc non Af Amer: 43 — ABNORMAL LOW
GFR calc non Af Amer: 43 — ABNORMAL LOW
Glucose, Bld: 108 — ABNORMAL HIGH
Glucose, Bld: 81
Glucose, Bld: 84
Glucose, Bld: 97
Glucose, Bld: 107 — ABNORMAL HIGH
Potassium: 4.3
Potassium: 3.7
Potassium: 5.7 — ABNORMAL HIGH
Sodium: 134 — ABNORMAL LOW
Sodium: 139
Sodium: 138
Sodium: 139

## 2011-01-24 LAB — POCT CARDIAC MARKERS
CKMB, poc: 1.3
Myoglobin, poc: 252

## 2011-01-24 LAB — CARDIAC PANEL(CRET KIN+CKTOT+MB+TROPI)
CK, MB: 1.1
CK, MB: 1.6
Total CK: 32

## 2011-01-24 LAB — TSH: TSH: 5.39

## 2011-01-24 LAB — WOUND CULTURE

## 2011-01-24 LAB — PROTIME-INR: Prothrombin Time: 13.1

## 2011-01-24 LAB — APTT: aPTT: 25

## 2011-01-24 LAB — TROPONIN I: Troponin I: 0.01

## 2011-02-05 ENCOUNTER — Encounter: Payer: Self-pay | Admitting: *Deleted

## 2011-02-11 LAB — I-STAT 8, (EC8 V) (CONVERTED LAB)
Bicarbonate: 29 — ABNORMAL HIGH
HCT: 48 — ABNORMAL HIGH
Hemoglobin: 16.3 — ABNORMAL HIGH
Operator id: 198171
Potassium: 3.5
Sodium: 139
TCO2: 30

## 2011-02-11 LAB — CBC
Hemoglobin: 14.7
MCV: 94.6
RBC: 4.6
WBC: 10.3

## 2011-02-11 LAB — DIFFERENTIAL
Eosinophils Absolute: 0.6
Lymphs Abs: 1.8
Monocytes Absolute: 0.7
Monocytes Relative: 7
Neutro Abs: 7.2
Neutrophils Relative %: 70

## 2011-02-11 LAB — POCT CARDIAC MARKERS
CKMB, poc: 2.4
Myoglobin, poc: 340

## 2011-02-11 LAB — POCT I-STAT CREATININE: Creatinine, Ser: 1.2

## 2011-02-11 LAB — T4: T4, Total: 9.8

## 2011-02-11 LAB — TSH: TSH: 4.99

## 2011-02-26 ENCOUNTER — Telehealth: Payer: Self-pay

## 2011-02-27 ENCOUNTER — Encounter (HOSPITAL_BASED_OUTPATIENT_CLINIC_OR_DEPARTMENT_OTHER): Payer: Medicare Other | Admitting: Oncology

## 2011-02-27 ENCOUNTER — Other Ambulatory Visit: Payer: Self-pay | Admitting: Oncology

## 2011-02-27 DIAGNOSIS — M949 Disorder of cartilage, unspecified: Secondary | ICD-10-CM

## 2011-02-27 DIAGNOSIS — C50219 Malignant neoplasm of upper-inner quadrant of unspecified female breast: Secondary | ICD-10-CM

## 2011-02-27 DIAGNOSIS — C649 Malignant neoplasm of unspecified kidney, except renal pelvis: Secondary | ICD-10-CM

## 2011-02-27 LAB — CBC WITH DIFFERENTIAL/PLATELET
BASO%: 0.4 % (ref 0.0–2.0)
EOS%: 3.6 % (ref 0.0–7.0)
MCH: 30.8 pg (ref 25.1–34.0)
MCHC: 33.1 g/dL (ref 31.5–36.0)
MONO#: 1 10*3/uL — ABNORMAL HIGH (ref 0.1–0.9)
RBC: 4.55 10*6/uL (ref 3.70–5.45)
RDW: 13.3 % (ref 11.2–14.5)
WBC: 8 10*3/uL (ref 3.9–10.3)
lymph#: 0.8 10*3/uL — ABNORMAL LOW (ref 0.9–3.3)

## 2011-02-28 LAB — COMPREHENSIVE METABOLIC PANEL
ALT: 17 U/L (ref 0–35)
AST: 18 U/L (ref 0–37)
CO2: 19 mEq/L (ref 19–32)
Calcium: 9.4 mg/dL (ref 8.4–10.5)
Chloride: 102 mEq/L (ref 96–112)
Creatinine, Ser: 1.26 mg/dL — ABNORMAL HIGH (ref 0.50–1.10)
Sodium: 140 mEq/L (ref 135–145)
Total Protein: 6.6 g/dL (ref 6.0–8.3)

## 2011-02-28 LAB — CANCER ANTIGEN 27.29: CA 27.29: 12 U/mL (ref 0–39)

## 2011-03-06 ENCOUNTER — Other Ambulatory Visit: Payer: Medicare Other | Admitting: Lab

## 2011-03-25 ENCOUNTER — Telehealth: Payer: Self-pay | Admitting: *Deleted

## 2011-03-25 ENCOUNTER — Ambulatory Visit (HOSPITAL_BASED_OUTPATIENT_CLINIC_OR_DEPARTMENT_OTHER): Payer: Medicare Other

## 2011-03-25 ENCOUNTER — Ambulatory Visit (HOSPITAL_BASED_OUTPATIENT_CLINIC_OR_DEPARTMENT_OTHER): Payer: Medicare Other | Admitting: Oncology

## 2011-03-25 VITALS — BP 127/82 | HR 94 | Temp 97.4°F | Ht 65.5 in | Wt 229.9 lb

## 2011-03-25 DIAGNOSIS — C50911 Malignant neoplasm of unspecified site of right female breast: Secondary | ICD-10-CM

## 2011-03-25 DIAGNOSIS — E559 Vitamin D deficiency, unspecified: Secondary | ICD-10-CM

## 2011-03-25 DIAGNOSIS — C50919 Malignant neoplasm of unspecified site of unspecified female breast: Secondary | ICD-10-CM

## 2011-03-25 DIAGNOSIS — C50219 Malignant neoplasm of upper-inner quadrant of unspecified female breast: Secondary | ICD-10-CM

## 2011-03-25 DIAGNOSIS — C50912 Malignant neoplasm of unspecified site of left female breast: Secondary | ICD-10-CM

## 2011-03-25 DIAGNOSIS — M949 Disorder of cartilage, unspecified: Secondary | ICD-10-CM

## 2011-03-25 MED ORDER — ZOLEDRONIC ACID 4 MG/100ML IV SOLN
4.0000 mg | Freq: Once | INTRAVENOUS | Status: AC
  Administered 2011-03-25: 4 mg via INTRAVENOUS
  Filled 2011-03-25: qty 100

## 2011-03-25 MED ORDER — SODIUM CHLORIDE 0.9 % IV SOLN
Freq: Once | INTRAVENOUS | Status: AC
  Administered 2011-03-25: 15:00:00 via INTRAVENOUS

## 2011-03-25 NOTE — Telephone Encounter (Signed)
GAVE PATIENT APPOINTMENT FOR 08-2011.  

## 2011-03-25 NOTE — Patient Instructions (Signed)
1510-Pt discharged ambulatory with next appointment confirmed.  Pt aware to call with any questions or concerns.  

## 2011-03-25 NOTE — Progress Notes (Signed)
Hematology and Oncology Follow Up Visit  [name] [id number] [birth year] 77 y.o. 03/25/2011 1:49 PM Cc dr Adelina Mings        Dr Sharl Ma        Dr l borden  Principle Diagnosis: 77 year old woman with history of bilateral breast cancers, status post bilateral lumpectomies 05/05/2007, status post radiation therapy to both breasts completed 09/10/2007. Continues on Femara 2. history of renal cell cancer status post nephrectomy May 09 on observation 3. history of arrhythmia pacemaker in place followed by cardiology History of hypothyroidism and hypertension   Interim History:  Patient is doing well. She recently had some GI problems and hasn't diarrhea which is slowly resolving. Her general medical health has been fairly good. She is due for a followup mammogram in December which is been scheduled. It would appear she is also due for bone density test next year. She is taking vitamin D and is due for Zometa today. No other complaint  Medications: I have reviewed the patient's current medications.  Allergies:  Allergies  Allergen Reactions  . Erythromycin Hives  . Tape     BAND AIDS-SKIN IRRITATION    Past Medical History, Surgical history, Social history, and Family History were reviewed and updated.  Review of Systems: Constitutional:  Negative for fever, chills, night sweats, anorexia, weight loss, pain. Cardiovascular: no chest pain or dyspnea on exertion Respiratory: no cough, shortness of breath, or wheezing Neurological: no TIA or stroke symptoms Dermatological: negative ENT: negative Skin Gastrointestinal: no abdominal pain,  Resolving diarrhea, , or black or bloody stools Genito-Urinary: no dysuria, trouble voiding, or hematuria Hematological and Lymphatic: negative Breast: negative for breast lumps Musculoskeletal: negative Remaining ROS negative.  Physical Exam: Blood pressure 127/82, pulse 94, temperature 97.4 F (36.3 C), temperature source Oral, height 5' 5.5"  (1.664 m), weight 229 lb 14.4 oz (104.282 kg). ECOG: 0General appearance: alert, cooperative and appears stated age Head: Normocephalic, without obvious abnormality, atraumatic Neck: no adenopathy, no carotid bruit, no JVD, supple, symmetrical, trachea midline and thyroid not enlarged, symmetric, no tenderness/mass/nodules Lymph nodes: Cervical, supraclavicular, and axillary nodes normal. Cardiac : Heart sounds normal pacemaker implant  Pulmonary:Normal breath sounds Breasts:Normal breasts both axilla negative, left breast has telangiectasia secondary to radiation  Abdomen:Component no obvious masses or organomegaly  ExtremitiesNo significant edema  Neuro:Rosalie normal  Lab Results: Lab Results  Component Value Date   WBC 8.0 02/27/2011   HGB 14.0 02/27/2011   HCT 42.3 02/27/2011   MCV 93.0 02/27/2011   PLT 326 02/27/2011     Chemistry      Component Value Date/Time   NA 140 02/27/2011 0943   NA 140 02/27/2011 0943   NA 140 02/27/2011 0943   K 4.1 02/27/2011 0943   K 4.1 02/27/2011 0943   K 4.1 02/27/2011 0943   CL 102 02/27/2011 0943   CL 102 02/27/2011 0943   CL 102 02/27/2011 0943   CO2 19 02/27/2011 0943   CO2 19 02/27/2011 0943   CO2 19 02/27/2011 0943   BUN 16 02/27/2011 0943   BUN 16 02/27/2011 0943   BUN 16 02/27/2011 0943   CREATININE 1.26* 02/27/2011 0943   CREATININE 1.26* 02/27/2011 0943   CREATININE 1.26* 02/27/2011 0943      Component Value Date/Time   CALCIUM 9.4 02/27/2011 0943   CALCIUM 9.4 02/27/2011 0943   CALCIUM 9.4 02/27/2011 0943   ALKPHOS 110 02/27/2011 0943   ALKPHOS 110 02/27/2011 0943   ALKPHOS 110 02/27/2011 0943   AST 18  02/27/2011 0943   AST 18 02/27/2011 0943   AST 18 02/27/2011 0943   ALT 17 02/27/2011 0943   ALT 17 02/27/2011 0943   ALT 17 02/27/2011 0943   BILITOT 0.4 02/27/2011 0943   BILITOT 0.4 02/27/2011 0943   BILITOT 0.4 02/27/2011 8119       Radiological Studies: chest X-ray N/a mammogram Due in dec/12 Bone  density Due 3/13  Impression and Plan: Patient is doing well. No clinical evidence of recurrence. I will see her in 6 months. She'll continue Femara. We will look forward to seeing her bone density test and mammogram results.  More than 50% of the visit was spent in patient-related counselling   Pierce Crane, MD 11/26/20121:49 PM

## 2011-04-15 ENCOUNTER — Ambulatory Visit
Admission: RE | Admit: 2011-04-15 | Discharge: 2011-04-15 | Disposition: A | Discharge status: Home / Self Care | Payer: Medicare Other | Source: Ambulatory Visit | Attending: Oncology | Admitting: Oncology

## 2011-04-15 DIAGNOSIS — Z853 Personal history of malignant neoplasm of breast: Secondary | ICD-10-CM

## 2011-05-01 DIAGNOSIS — R6889 Other general symptoms and signs: Secondary | ICD-10-CM | POA: Diagnosis not present

## 2011-05-01 DIAGNOSIS — R5381 Other malaise: Secondary | ICD-10-CM | POA: Diagnosis not present

## 2011-05-01 DIAGNOSIS — I5022 Chronic systolic (congestive) heart failure: Secondary | ICD-10-CM | POA: Diagnosis not present

## 2011-05-01 DIAGNOSIS — D649 Anemia, unspecified: Secondary | ICD-10-CM | POA: Diagnosis not present

## 2011-05-01 DIAGNOSIS — Z79899 Other long term (current) drug therapy: Secondary | ICD-10-CM | POA: Diagnosis not present

## 2011-05-01 DIAGNOSIS — E782 Mixed hyperlipidemia: Secondary | ICD-10-CM | POA: Diagnosis not present

## 2011-06-11 DIAGNOSIS — B351 Tinea unguium: Secondary | ICD-10-CM | POA: Diagnosis not present

## 2011-06-26 DIAGNOSIS — E782 Mixed hyperlipidemia: Secondary | ICD-10-CM | POA: Diagnosis not present

## 2011-06-26 DIAGNOSIS — Z79899 Other long term (current) drug therapy: Secondary | ICD-10-CM | POA: Diagnosis not present

## 2011-07-23 ENCOUNTER — Ambulatory Visit
Admission: RE | Admit: 2011-07-23 | Discharge: 2011-07-23 | Disposition: A | Discharge status: Home / Self Care | Payer: Medicare Other | Source: Ambulatory Visit | Attending: Oncology | Admitting: Oncology

## 2011-07-23 DIAGNOSIS — C50919 Malignant neoplasm of unspecified site of unspecified female breast: Secondary | ICD-10-CM

## 2011-07-23 DIAGNOSIS — Z780 Asymptomatic menopausal state: Secondary | ICD-10-CM | POA: Diagnosis not present

## 2011-07-23 DIAGNOSIS — E559 Vitamin D deficiency, unspecified: Secondary | ICD-10-CM

## 2011-07-23 DIAGNOSIS — M949 Disorder of cartilage, unspecified: Secondary | ICD-10-CM | POA: Diagnosis not present

## 2011-09-09 DIAGNOSIS — Z79899 Other long term (current) drug therapy: Secondary | ICD-10-CM | POA: Diagnosis not present

## 2011-09-09 DIAGNOSIS — Z45018 Encounter for adjustment and management of other part of cardiac pacemaker: Secondary | ICD-10-CM | POA: Diagnosis not present

## 2011-09-09 DIAGNOSIS — E782 Mixed hyperlipidemia: Secondary | ICD-10-CM | POA: Diagnosis not present

## 2011-09-09 DIAGNOSIS — R6889 Other general symptoms and signs: Secondary | ICD-10-CM | POA: Diagnosis not present

## 2011-09-09 DIAGNOSIS — R5381 Other malaise: Secondary | ICD-10-CM | POA: Diagnosis not present

## 2011-09-09 DIAGNOSIS — I5022 Chronic systolic (congestive) heart failure: Secondary | ICD-10-CM | POA: Diagnosis not present

## 2011-09-10 DIAGNOSIS — M79609 Pain in unspecified limb: Secondary | ICD-10-CM | POA: Diagnosis not present

## 2011-09-10 DIAGNOSIS — B351 Tinea unguium: Secondary | ICD-10-CM | POA: Diagnosis not present

## 2011-09-18 ENCOUNTER — Other Ambulatory Visit (HOSPITAL_BASED_OUTPATIENT_CLINIC_OR_DEPARTMENT_OTHER): Payer: Medicare Other | Admitting: Lab

## 2011-09-18 ENCOUNTER — Telehealth: Payer: Self-pay | Admitting: *Deleted

## 2011-09-18 ENCOUNTER — Ambulatory Visit (HOSPITAL_BASED_OUTPATIENT_CLINIC_OR_DEPARTMENT_OTHER): Payer: Medicare Other | Admitting: Oncology

## 2011-09-18 VITALS — BP 110/75 | HR 91 | Temp 97.8°F | Ht 65.5 in | Wt 238.8 lb

## 2011-09-18 DIAGNOSIS — E559 Vitamin D deficiency, unspecified: Secondary | ICD-10-CM

## 2011-09-18 DIAGNOSIS — C50919 Malignant neoplasm of unspecified site of unspecified female breast: Secondary | ICD-10-CM | POA: Diagnosis not present

## 2011-09-18 DIAGNOSIS — C50319 Malignant neoplasm of lower-inner quadrant of unspecified female breast: Secondary | ICD-10-CM

## 2011-09-18 LAB — CBC WITH DIFFERENTIAL/PLATELET
Basophils Absolute: 0 10*3/uL (ref 0.0–0.1)
Eosinophils Absolute: 0.5 10*3/uL (ref 0.0–0.5)
HGB: 15.2 g/dL (ref 11.6–15.9)
NEUT#: 6.1 10*3/uL (ref 1.5–6.5)
RDW: 12.7 % (ref 11.2–14.5)
WBC: 8.4 10*3/uL (ref 3.9–10.3)
lymph#: 1.2 10*3/uL (ref 0.9–3.3)

## 2011-09-18 LAB — COMPREHENSIVE METABOLIC PANEL
ALT: 11 U/L (ref 0–35)
Albumin: 4.2 g/dL (ref 3.5–5.2)
Alkaline Phosphatase: 75 U/L (ref 39–117)
CO2: 25 mEq/L (ref 19–32)
Glucose, Bld: 87 mg/dL (ref 70–99)
Potassium: 4.1 mEq/L (ref 3.5–5.3)
Sodium: 143 mEq/L (ref 135–145)
Total Protein: 6.7 g/dL (ref 6.0–8.3)

## 2011-09-18 MED ORDER — ALENDRONATE SODIUM 35 MG PO TABS: 35.0000 mg | ORAL_TABLET | ORAL | Status: DC

## 2011-09-18 NOTE — Progress Notes (Signed)
Hematology and Oncology Follow Up Visit  [name] [id number] Aug 29, 1927 78 y.o. 09/18/2011 9:33 AM Cc dr Adelina Mings        Dr Sharl Ma        Dr l borden  Principle Diagnosis: 78 year old woman with history of bilateral breast cancers, status post bilateral lumpectomies 05/05/2007, status post radiation therapy to both breasts completed 09/10/2007. Continues on Femara 2. history of renal cell cancer status post nephrectomy May 09 on observation 3. history of arrhythmia pacemaker in place followed by cardiology History of hypothyroidism and hypertension   Interim History:  Patient is doing well.Geer GI issues have improved with a new probiotic. Her most recent mammogram 12/12- was within normal limits.Her most recent bone density 3/13, showed slight decrease in density of spine and an increase in her hips.She sees urology in august. Her husband has some chronic medical issues.  Medications: I have reviewed the patient's current medications.  Allergies:  Allergies  Allergen Reactions  . Erythromycin Hives  . Tape     BAND AIDS-SKIN IRRITATION    Past Medical History, Surgical history, Social history, and Family History were reviewed and updated.  Review of Systems: Constitutional:  Negative for fever, chills, night sweats, anorexia, weight loss, pain. Cardiovascular: no chest pain or dyspnea on exertion Respiratory: no cough, shortness of breath, or wheezing Neurological: no TIA or stroke symptoms Dermatological: negative ENT: negative Skin Gastrointestinal: no abdominal pain,  Resolving diarrhea, , or black or bloody stools Genito-Urinary: no dysuria, trouble voiding, or hematuria Hematological and Lymphatic: negative Breast: negative for breast lumps Musculoskeletal: negative Remaining ROS negative.  Physical Exam: Blood pressure 110/75, pulse 91, temperature 97.8 F (36.6 C), height 5' 5.5" (1.664 m), weight 238 lb 12.8 oz (108.319 kg). ECOG: 0General appearance:  alert, cooperative and appears stated age Head: Normocephalic, without obvious abnormality, atraumatic Neck: no adenopathy, no carotid bruit, no JVD, supple, symmetrical, trachea midline and thyroid not enlarged, symmetric, no tenderness/mass/nodules Lymph nodes: Cervical, supraclavicular, and axillary nodes normal. Cardiac : Heart sounds normal pacemaker implant  Pulmonary:Normal breath sounds Breasts:Normal breasts both axilla negative, left breast has telangiectasia secondary to radiation  Abdomen:Component no obvious masses or organomegaly  ExtremitiesNo significant edema  Neuro:Rosalie normal  Lab Results: Lab Results  Component Value Date   WBC 8.4 09/18/2011   HGB 15.2 09/18/2011   HCT 45.1 09/18/2011   MCV 94.4 09/18/2011   PLT 232 09/18/2011     Chemistry      Component Value Date/Time   NA 140 02/27/2011 0943   NA 140 02/27/2011 0943   NA 140 02/27/2011 0943   K 4.1 02/27/2011 0943   K 4.1 02/27/2011 0943   K 4.1 02/27/2011 0943   CL 102 02/27/2011 0943   CL 102 02/27/2011 0943   CL 102 02/27/2011 0943   CO2 19 02/27/2011 0943   CO2 19 02/27/2011 0943   CO2 19 02/27/2011 0943   BUN 16 02/27/2011 0943   BUN 16 02/27/2011 0943   BUN 16 02/27/2011 0943   CREATININE 1.26* 02/27/2011 0943   CREATININE 1.26* 02/27/2011 0943   CREATININE 1.26* 02/27/2011 0943      Component Value Date/Time   CALCIUM 9.4 02/27/2011 0943   CALCIUM 9.4 02/27/2011 0943   CALCIUM 9.4 02/27/2011 0943   ALKPHOS 110 02/27/2011 0943   ALKPHOS 110 02/27/2011 0943   ALKPHOS 110 02/27/2011 0943   AST 18 02/27/2011 0943   AST 18 02/27/2011 0943   AST 18 02/27/2011 0943   ALT 17  02/27/2011 0943   ALT 17 02/27/2011 0943   ALT 17 02/27/2011 0943   BILITOT 0.4 02/27/2011 0943   BILITOT 0.4 02/27/2011 0943   BILITOT 0.4 02/27/2011 9147       Radiological Studies: chest X-ray N/a mammogram dec/12 Bone density Due 3/13  Impression and Plan: Patient is doing well. No clinical evidence  of recurrence. I will see her in 6 months. She'll continue Femara.  I have recommended low dose fosamax.  More than 50% of the visit was spent in patient-related counselling   Pierce Crane, MD 5/22/20139:33 AM

## 2011-09-18 NOTE — Telephone Encounter (Signed)
gave patient appointment for 03-03-2012 starting at 8:30am printed out calendar and gave to the patient

## 2011-09-30 ENCOUNTER — Other Ambulatory Visit: Payer: Self-pay | Admitting: Oncology

## 2011-09-30 DIAGNOSIS — C50919 Malignant neoplasm of unspecified site of unspecified female breast: Secondary | ICD-10-CM

## 2011-10-15 DIAGNOSIS — J984 Other disorders of lung: Secondary | ICD-10-CM | POA: Diagnosis not present

## 2011-10-15 DIAGNOSIS — C649 Malignant neoplasm of unspecified kidney, except renal pelvis: Secondary | ICD-10-CM | POA: Diagnosis not present

## 2011-10-15 DIAGNOSIS — N281 Cyst of kidney, acquired: Secondary | ICD-10-CM | POA: Diagnosis not present

## 2011-12-11 DIAGNOSIS — C649 Malignant neoplasm of unspecified kidney, except renal pelvis: Secondary | ICD-10-CM | POA: Diagnosis not present

## 2011-12-11 DIAGNOSIS — R351 Nocturia: Secondary | ICD-10-CM | POA: Diagnosis not present

## 2011-12-24 DIAGNOSIS — D485 Neoplasm of uncertain behavior of skin: Secondary | ICD-10-CM | POA: Diagnosis not present

## 2011-12-24 DIAGNOSIS — D235 Other benign neoplasm of skin of trunk: Secondary | ICD-10-CM | POA: Diagnosis not present

## 2011-12-31 DIAGNOSIS — M79609 Pain in unspecified limb: Secondary | ICD-10-CM | POA: Diagnosis not present

## 2011-12-31 DIAGNOSIS — B351 Tinea unguium: Secondary | ICD-10-CM | POA: Diagnosis not present

## 2012-01-21 DIAGNOSIS — E890 Postprocedural hypothyroidism: Secondary | ICD-10-CM | POA: Diagnosis not present

## 2012-01-21 DIAGNOSIS — R197 Diarrhea, unspecified: Secondary | ICD-10-CM | POA: Diagnosis not present

## 2012-01-22 DIAGNOSIS — R197 Diarrhea, unspecified: Secondary | ICD-10-CM | POA: Diagnosis not present

## 2012-01-22 DIAGNOSIS — E890 Postprocedural hypothyroidism: Secondary | ICD-10-CM | POA: Diagnosis not present

## 2012-01-22 DIAGNOSIS — Z23 Encounter for immunization: Secondary | ICD-10-CM | POA: Diagnosis not present

## 2012-03-02 ENCOUNTER — Other Ambulatory Visit: Payer: Self-pay | Admitting: *Deleted

## 2012-03-02 DIAGNOSIS — C50919 Malignant neoplasm of unspecified site of unspecified female breast: Secondary | ICD-10-CM

## 2012-03-02 DIAGNOSIS — M949 Disorder of cartilage, unspecified: Secondary | ICD-10-CM

## 2012-03-03 ENCOUNTER — Ambulatory Visit: Payer: Medicare Other | Admitting: Oncology

## 2012-03-03 ENCOUNTER — Ambulatory Visit (HOSPITAL_BASED_OUTPATIENT_CLINIC_OR_DEPARTMENT_OTHER): Payer: Medicare Other | Admitting: Oncology

## 2012-03-03 ENCOUNTER — Other Ambulatory Visit (HOSPITAL_BASED_OUTPATIENT_CLINIC_OR_DEPARTMENT_OTHER): Payer: Medicare Other | Admitting: Lab

## 2012-03-03 ENCOUNTER — Telehealth: Payer: Self-pay | Admitting: Oncology

## 2012-03-03 VITALS — BP 118/76 | HR 90 | Temp 97.7°F | Resp 20 | Ht 65.5 in | Wt 238.0 lb

## 2012-03-03 DIAGNOSIS — M899 Disorder of bone, unspecified: Secondary | ICD-10-CM | POA: Diagnosis not present

## 2012-03-03 DIAGNOSIS — C50919 Malignant neoplasm of unspecified site of unspecified female breast: Secondary | ICD-10-CM

## 2012-03-03 DIAGNOSIS — M949 Disorder of cartilage, unspecified: Secondary | ICD-10-CM | POA: Diagnosis not present

## 2012-03-03 DIAGNOSIS — E559 Vitamin D deficiency, unspecified: Secondary | ICD-10-CM

## 2012-03-03 DIAGNOSIS — C50319 Malignant neoplasm of lower-inner quadrant of unspecified female breast: Secondary | ICD-10-CM | POA: Diagnosis not present

## 2012-03-03 LAB — COMPREHENSIVE METABOLIC PANEL (CC13)
ALT: 12 U/L (ref 0–55)
AST: 10 U/L (ref 5–34)
Alkaline Phosphatase: 95 U/L (ref 40–150)
BUN: 20 mg/dL (ref 7.0–26.0)
Chloride: 106 mEq/L (ref 98–107)
Creatinine: 1.2 mg/dL — ABNORMAL HIGH (ref 0.6–1.1)
Total Bilirubin: 0.57 mg/dL (ref 0.20–1.20)

## 2012-03-03 LAB — CBC WITH DIFFERENTIAL/PLATELET
BASO%: 0.3 % (ref 0.0–2.0)
Eosinophils Absolute: 0.4 10*3/uL (ref 0.0–0.5)
MCHC: 33.9 g/dL (ref 31.5–36.0)
MCV: 95.8 fL (ref 79.5–101.0)
MONO%: 6.9 % (ref 0.0–14.0)
NEUT#: 6 10*3/uL (ref 1.5–6.5)
RBC: 4.87 10*6/uL (ref 3.70–5.45)
RDW: 13.1 % (ref 11.2–14.5)
WBC: 8.2 10*3/uL (ref 3.9–10.3)

## 2012-03-03 MED ORDER — ALENDRONATE SODIUM 35 MG PO TABS: 35.0000 mg | ORAL_TABLET | ORAL | Status: DC

## 2012-03-03 NOTE — Progress Notes (Signed)
Hematology and Oncology Follow Up Visit  [name] [id number] Jan 03, 1928 78 y.o. 03/03/2012 9:21 AM Cc dr Adelina Mings        Dr Sharl Ma        Dr l borden  Principle Diagnosis: 78 year old woman with history of bilateral breast cancers, status post bilateral lumpectomies 05/05/2007, status post radiation therapy to both breasts completed 09/10/2007. Continues on Femara 2. history of renal cell cancer status post nephrectomy May 09 on observation 3. history of arrhythmia pacemaker in place followed by cardiology History of hypothyroidism and hypertension   Interim History:  Patient is doing well.she feels well and has some LE weakness which is chroinc. She denies any other complaints. Most recent imaging of abdomen is negative  For recurrent renal cell.She  Needs a mammogram in dec /13.  Medications: I have reviewed the patient's current medications.  Allergies:  Allergies  Allergen Reactions  . Erythromycin Hives  . Tape     BAND AIDS-SKIN IRRITATION    Past Medical History, Surgical history, Social history, and Family History were reviewed and updated.  Review of Systems: Constitutional:  Negative for fever, chills, night sweats, anorexia, weight loss, pain. Cardiovascular: no chest pain or dyspnea on exertion Respiratory: no cough, shortness of breath, or wheezing Neurological: no TIA or stroke symptoms Dermatological: negative ENT: negative Skin Gastrointestinal: no abdominal pain,  Resolving diarrhea, , or black or bloody stools Genito-Urinary: no dysuria, trouble voiding, or hematuria Hematological and Lymphatic: negative Breast: negative for breast lumps Musculoskeletal: negative Remaining ROS negative.  Physical Exam: There were no vitals taken for this visit. ECOG: 0General appearance: alert, cooperative and appears stated age Head: Normocephalic, without obvious abnormality, atraumatic Neck: no adenopathy, no carotid bruit, no JVD, supple, symmetrical,  trachea midline and thyroid not enlarged, symmetric, no tenderness/mass/nodules Lymph nodes: Cervical, supraclavicular, and axillary nodes normal. Cardiac : Heart sounds normal pacemaker implant  Pulmonary:Normal breath sounds Breasts:Normal breasts both axilla negative, left breast has telangiectasia secondary to radiation  Abdomen:Component no obvious masses or organomegaly  ExtremitiesNo significant edema  Neuro:Rosalie normal  Lab Results: Lab Results  Component Value Date   WBC 8.2 03/03/2012   HGB 15.8 03/03/2012   HCT 46.6 03/03/2012   MCV 95.8 03/03/2012   PLT 225 03/03/2012     Chemistry      Component Value Date/Time   NA 143 09/18/2011 0832   K 4.1 09/18/2011 0832   CL 105 09/18/2011 0832   CO2 25 09/18/2011 0832   BUN 20 09/18/2011 0832   CREATININE 1.16* 09/18/2011 0832      Component Value Date/Time   CALCIUM 9.7 09/18/2011 0832   ALKPHOS 75 09/18/2011 0832   AST 12 09/18/2011 0832   ALT 11 09/18/2011 0832   BILITOT 0.5 09/18/2011 3086       Radiological Studies: chest X-ray N/a mammogram Dec/13 pending Bone density 3/13- sl decrease in BD  Impression and Plan: Patient is doing well. No clinical evidence of recurrence. I will see her in 6 months. She'll continue Femara.  I had  recommended low dose fosamax,  . More than 50% of the visit was spent in patient-related counselling   Pierce Crane, MD 11/5/20139:21 AM

## 2012-03-03 NOTE — Telephone Encounter (Signed)
gve the pt her dec mammo appt at [facility] along with the may 2014 appt calendar

## 2012-03-04 LAB — CANCER ANTIGEN 27.29: CA 27.29: 12 U/mL (ref 0–39)

## 2012-03-04 LAB — VITAMIN D 25 HYDROXY (VIT D DEFICIENCY, FRACTURES): Vit D, 25-Hydroxy: 71 ng/mL (ref 30–89)

## 2012-03-05 ENCOUNTER — Other Ambulatory Visit: Payer: Self-pay | Admitting: Oncology

## 2012-03-05 DIAGNOSIS — C50919 Malignant neoplasm of unspecified site of unspecified female breast: Secondary | ICD-10-CM

## 2012-03-17 DIAGNOSIS — I5022 Chronic systolic (congestive) heart failure: Secondary | ICD-10-CM | POA: Diagnosis not present

## 2012-03-17 DIAGNOSIS — I442 Atrioventricular block, complete: Secondary | ICD-10-CM | POA: Diagnosis not present

## 2012-03-18 ENCOUNTER — Other Ambulatory Visit (HOSPITAL_COMMUNITY): Payer: Self-pay | Admitting: Cardiovascular Disease

## 2012-03-18 DIAGNOSIS — I4891 Unspecified atrial fibrillation: Secondary | ICD-10-CM

## 2012-03-18 DIAGNOSIS — I509 Heart failure, unspecified: Secondary | ICD-10-CM

## 2012-04-07 ENCOUNTER — Ambulatory Visit (HOSPITAL_COMMUNITY)
Admission: RE | Admit: 2012-04-07 | Discharge: 2012-04-07 | Disposition: A | Discharge status: Home / Self Care | Payer: Medicare Other | Source: Ambulatory Visit | Attending: Cardiovascular Disease | Admitting: Cardiovascular Disease

## 2012-04-07 DIAGNOSIS — I509 Heart failure, unspecified: Secondary | ICD-10-CM | POA: Insufficient documentation

## 2012-04-07 DIAGNOSIS — I10 Essential (primary) hypertension: Secondary | ICD-10-CM | POA: Insufficient documentation

## 2012-04-07 DIAGNOSIS — I079 Rheumatic tricuspid valve disease, unspecified: Secondary | ICD-10-CM | POA: Insufficient documentation

## 2012-04-07 DIAGNOSIS — I4891 Unspecified atrial fibrillation: Secondary | ICD-10-CM

## 2012-04-07 NOTE — Progress Notes (Signed)
2D Echo Performed 04/07/2012    Emett Stapel, RCS  

## 2012-04-20 ENCOUNTER — Ambulatory Visit
Admission: RE | Admit: 2012-04-20 | Discharge: 2012-04-20 | Disposition: A | Discharge status: Home / Self Care | Payer: Medicare Other | Source: Ambulatory Visit | Attending: Oncology | Admitting: Oncology

## 2012-04-20 DIAGNOSIS — C50919 Malignant neoplasm of unspecified site of unspecified female breast: Secondary | ICD-10-CM

## 2012-04-20 DIAGNOSIS — E559 Vitamin D deficiency, unspecified: Secondary | ICD-10-CM

## 2012-04-20 DIAGNOSIS — Z853 Personal history of malignant neoplasm of breast: Secondary | ICD-10-CM | POA: Diagnosis not present

## 2012-06-23 DIAGNOSIS — L821 Other seborrheic keratosis: Secondary | ICD-10-CM | POA: Diagnosis not present

## 2012-07-18 ENCOUNTER — Encounter: Payer: Self-pay | Admitting: Oncology

## 2012-07-18 ENCOUNTER — Telehealth: Payer: Self-pay | Admitting: *Deleted

## 2012-07-18 NOTE — Telephone Encounter (Signed)
Confirmed 09/08/12 appt w/ pt.  Mailed letter & calendar.

## 2012-09-02 ENCOUNTER — Telehealth: Payer: Self-pay | Admitting: *Deleted

## 2012-09-02 NOTE — Telephone Encounter (Signed)
sw pt gv appt d/t for 09/14/12 @3pm ....td

## 2012-09-02 NOTE — Telephone Encounter (Signed)
i also mailed a cal...td 

## 2012-09-03 DIAGNOSIS — R6889 Other general symptoms and signs: Secondary | ICD-10-CM | POA: Diagnosis not present

## 2012-09-03 DIAGNOSIS — R5381 Other malaise: Secondary | ICD-10-CM | POA: Diagnosis not present

## 2012-09-03 DIAGNOSIS — R5383 Other fatigue: Secondary | ICD-10-CM | POA: Diagnosis not present

## 2012-09-03 DIAGNOSIS — E782 Mixed hyperlipidemia: Secondary | ICD-10-CM | POA: Diagnosis not present

## 2012-09-03 DIAGNOSIS — I359 Nonrheumatic aortic valve disorder, unspecified: Secondary | ICD-10-CM | POA: Diagnosis not present

## 2012-09-08 ENCOUNTER — Other Ambulatory Visit: Payer: Medicare Other | Admitting: Lab

## 2012-09-08 ENCOUNTER — Ambulatory Visit: Payer: Medicare Other | Admitting: Oncology

## 2012-09-14 ENCOUNTER — Other Ambulatory Visit: Payer: Self-pay | Admitting: Emergency Medicine

## 2012-09-14 ENCOUNTER — Encounter: Payer: Self-pay | Admitting: Oncology

## 2012-09-14 ENCOUNTER — Ambulatory Visit (HOSPITAL_BASED_OUTPATIENT_CLINIC_OR_DEPARTMENT_OTHER): Payer: Medicare Other | Admitting: Oncology

## 2012-09-14 VITALS — BP 149/87 | HR 94 | Temp 97.5°F | Resp 20 | Ht 65.5 in | Wt 214.4 lb

## 2012-09-14 DIAGNOSIS — E559 Vitamin D deficiency, unspecified: Secondary | ICD-10-CM

## 2012-09-14 DIAGNOSIS — C649 Malignant neoplasm of unspecified kidney, except renal pelvis: Secondary | ICD-10-CM | POA: Diagnosis not present

## 2012-09-14 DIAGNOSIS — C50319 Malignant neoplasm of lower-inner quadrant of unspecified female breast: Secondary | ICD-10-CM

## 2012-09-14 DIAGNOSIS — C50919 Malignant neoplasm of unspecified site of unspecified female breast: Secondary | ICD-10-CM

## 2012-09-14 MED ORDER — LETROZOLE 2.5 MG PO TABS: 2.5000 mg | ORAL_TABLET | Freq: Every day | ORAL | Status: DC

## 2012-09-14 MED ORDER — ALENDRONATE SODIUM 35 MG PO TABS: 35.0000 mg | ORAL_TABLET | ORAL | Status: AC

## 2012-09-14 MED ORDER — VITAMIN D 50 MCG (2000 UT) PO TABS: ORAL_TABLET | ORAL | Status: DC

## 2012-09-14 NOTE — Patient Instructions (Addendum)
Continue letrozole, [facility]  I will see you back in 6 months

## 2012-09-14 NOTE — Progress Notes (Signed)
OFFICE PROGRESS NOTE  CC  [name], MD [address] [place] [address]  DIAGNOSIS: 79 year old female with history of bilateral breast cancers status post bilateral lumpectomies January 2009   PRIOR THERAPY:  #79 year old woman who has a history of bilateral breast cancer she underwent bilateral lumpectomies on 05/05/2007 followed by radiation therapy to both breasts which he completed 09/10/2007. She subsequently was started on Femara 2.5 mg daily she continues on this.  #2patient also has history of renal cell carcinoma status post nephrectomy in May 2009 on observation.  CURRENT THERAPY:Femara 2.5 mg daily  INTERVAL HISTORY: [name] 79 y.o. female returns for followup visit today. She has been seeing Dr. Beatris Ship her last visit with Dr. Donnie Coffin was in November 2013. Clinically patient seems to be doing well. She has no nausea no vomiting no fevers no aches or pains her hot flashes.remainder of the 10 point review of systems is negative.  MEDICAL HISTORY: Past Medical History  Diagnosis Date  . 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 Cataract   . Hypertension   . Thyroid disease     ALLERGIES:  is allergic to erythromycin and tape.  MEDICATIONS:  Current Outpatient Prescriptions  Medication Sig Dispense Refill  . alendronate (FOSAMAX) 35 MG tablet Take 1 tablet (35 mg total) by mouth every 7 (seven) days. Take with a full glass of water on an empty stomach.  12 tablet  7  . aspirin 81 MG tablet Take 81 mg by mouth daily.        . Calcium Carbonate-Vitamin D (CALCIUM 600+D PO) Take 1 tablet by mouth 2 (two) times daily.        . Cholecalciferol (VITAMIN D) 2000 UNITS tablet Take 2,000 Units by mouth daily.        . furosemide (LASIX) 20 MG tablet Take 20 mg by mouth daily.        Marland Kitchen letrozole (FEMARA) 2.5 MG tablet Take 1 tablet (2.5 mg total) by mouth daily.  90 tablet  7  . levothyroxine (SYNTHROID,  LEVOTHROID) 50 MCG tablet Take 50 mcg by mouth daily.        . midodrine (PROAMATINE) 2.5 MG tablet Take 2.5 mg by mouth 2 (two) times daily.      . Probiotic Product (PROBIOTIC FORMULA PO) Take 1 tablet by mouth daily.      . simvastatin (ZOCOR) 20 MG tablet Take 20 mg by mouth at bedtime.         No current facility-administered medications for this visit.    SURGICAL HISTORY:  Past Surgical History  Procedure Laterality Date  . Abdominal hysterectomy    . Cholecystectomy    . Cataract extraction      EYE SURGERY X 2    REVIEW OF SYSTEMS:  Pertinent items are noted in HPI.   HEALTH MAINTENANCE:    PHYSICAL EXAMINATION: Blood pressure 149/87, pulse 94, temperature 97.5 F (36.4 C), temperature source Oral, resp. rate 20, height 5' 5.5" (1.664 m), weight 214 lb 6.4 oz (97.251 kg). Body mass index is 35.12 kg/(m^2). ECOG PERFORMANCE STATUS: 1 - Symptomatic but completely ambulatory   well-developed well-nourished elderly female in no acute distress HEENT exam oral mucosa is moist neck supple lungs clear cardiovascular regular rate rhythm abdomen soft no HSM extremities +1 edema neuro is nonfocal bilateral breast exam reveals well-healed surgical scars no masses nipple discharge.   LABORATORY DATA: Lab Results  Component Value Date  WBC 8.2 03/03/2012   HGB 15.8 03/03/2012   HCT 46.6 03/03/2012   MCV 95.8 03/03/2012   PLT 225 03/03/2012      Chemistry      Component Value Date/Time   NA 142 03/03/2012 0831   NA 143 09/18/2011 0832   K 4.0 03/03/2012 0831   K 4.1 09/18/2011 0832   CL 106 03/03/2012 0831   CL 105 09/18/2011 0832   CO2 25 03/03/2012 0831   CO2 25 09/18/2011 0832   BUN 20.0 03/03/2012 0831   BUN 20 09/18/2011 0832   CREATININE 1.2* 03/03/2012 0831   CREATININE 1.16* 09/18/2011 0832      Component Value Date/Time   CALCIUM 10.1 03/03/2012 0831   CALCIUM 9.7 09/18/2011 0832   ALKPHOS 95 03/03/2012 0831   ALKPHOS 75 09/18/2011 0832   AST 10 03/03/2012 0831   AST 12  09/18/2011 0832   ALT 12 03/03/2012 0831   ALT 11 09/18/2011 0832   BILITOT 0.57 03/03/2012 0831   BILITOT 0.5 09/18/2011 0832       RADIOGRAPHIC STUDIES:  No results found.  ASSESSMENT: 79 year old female with  #1 bilateral breast cancers diagnosed January 2009 status post bilateral lumpectomies followed by radiation. She is now on letrozole 2.5 mg daily tolerating it well. Total of 6-7 years of therapy is planned.  #2 renal cell carcinoma vision is status post nephrectomy she is followed by Dr. Laverle Patter.   PLAN:   #1 continue letrozole. [facility].  #2 I will see her back in 6 months time for followup.   All questions were answered. The patient knows to call the clinic with any problems, questions or concerns. We can certainly see the patient much sooner if necessary.  I spent 25 minutes counseling the patient face to face. The total time spent in the appointment was 30 minutes.    Drue Second, MD Medical/Oncology [facility] [phone] (beeper) [phone] (Office)  09/14/2012, 3:30 PM

## 2012-09-15 ENCOUNTER — Ambulatory Visit: Payer: Medicare Other | Admitting: Oncology

## 2012-12-22 ENCOUNTER — Other Ambulatory Visit: Payer: Self-pay | Admitting: *Deleted

## 2012-12-22 ENCOUNTER — Other Ambulatory Visit: Payer: Self-pay | Admitting: Cardiovascular Disease

## 2012-12-22 DIAGNOSIS — I509 Heart failure, unspecified: Secondary | ICD-10-CM

## 2012-12-22 DIAGNOSIS — E782 Mixed hyperlipidemia: Secondary | ICD-10-CM | POA: Diagnosis not present

## 2012-12-22 DIAGNOSIS — R6889 Other general symptoms and signs: Secondary | ICD-10-CM | POA: Diagnosis not present

## 2012-12-22 DIAGNOSIS — I447 Left bundle-branch block, unspecified: Secondary | ICD-10-CM | POA: Diagnosis not present

## 2012-12-22 DIAGNOSIS — R5381 Other malaise: Secondary | ICD-10-CM | POA: Diagnosis not present

## 2012-12-22 LAB — CBC WITH DIFFERENTIAL/PLATELET
Basophils Relative: 1 % (ref 0–1)
Eosinophils Absolute: 0.4 10*3/uL (ref 0.0–0.7)
HCT: 45.4 % (ref 36.0–46.0)
Hemoglobin: 15.4 g/dL — ABNORMAL HIGH (ref 12.0–15.0)
Lymphs Abs: 1.2 10*3/uL (ref 0.7–4.0)
MCH: 31.4 pg (ref 26.0–34.0)
MCHC: 33.9 g/dL (ref 30.0–36.0)
MCV: 92.7 fL (ref 78.0–100.0)
Monocytes Absolute: 0.6 10*3/uL (ref 0.1–1.0)
Monocytes Relative: 8 % (ref 3–12)
RBC: 4.9 MIL/uL (ref 3.87–5.11)

## 2012-12-22 LAB — PACEMAKER DEVICE OBSERVATION

## 2012-12-22 LAB — COMPREHENSIVE METABOLIC PANEL
Albumin: 4.4 g/dL (ref 3.5–5.2)
Alkaline Phosphatase: 81 U/L (ref 39–117)
BUN: 18 mg/dL (ref 6–23)
CO2: 27 mEq/L (ref 19–32)
Glucose, Bld: 93 mg/dL (ref 70–99)
Total Bilirubin: 0.6 mg/dL (ref 0.3–1.2)
Total Protein: 7.3 g/dL (ref 6.0–8.3)

## 2012-12-22 LAB — LIPID PANEL
Cholesterol: 203 mg/dL — ABNORMAL HIGH (ref 0–200)
HDL: 54 mg/dL (ref >39)
LDL Cholesterol: 121 mg/dL — ABNORMAL HIGH (ref 0–99)
Triglycerides: 140 mg/dL (ref <150)
VLDL: 28 mg/dL (ref 0–40)

## 2012-12-30 ENCOUNTER — Ambulatory Visit (HOSPITAL_COMMUNITY)
Admission: RE | Admit: 2012-12-30 | Discharge: 2012-12-30 | Disposition: A | Discharge status: Home / Self Care | Payer: Medicare Other | Source: Ambulatory Visit | Attending: Cardiovascular Disease | Admitting: Cardiovascular Disease

## 2012-12-30 DIAGNOSIS — E785 Hyperlipidemia, unspecified: Secondary | ICD-10-CM | POA: Insufficient documentation

## 2012-12-30 DIAGNOSIS — Z853 Personal history of malignant neoplasm of breast: Secondary | ICD-10-CM | POA: Diagnosis not present

## 2012-12-30 DIAGNOSIS — I5021 Acute systolic (congestive) heart failure: Secondary | ICD-10-CM | POA: Insufficient documentation

## 2012-12-30 DIAGNOSIS — I4891 Unspecified atrial fibrillation: Secondary | ICD-10-CM | POA: Diagnosis not present

## 2012-12-30 DIAGNOSIS — E669 Obesity, unspecified: Secondary | ICD-10-CM | POA: Insufficient documentation

## 2012-12-30 DIAGNOSIS — I447 Left bundle-branch block, unspecified: Secondary | ICD-10-CM | POA: Diagnosis not present

## 2012-12-30 DIAGNOSIS — I509 Heart failure, unspecified: Secondary | ICD-10-CM | POA: Diagnosis not present

## 2012-12-30 DIAGNOSIS — I10 Essential (primary) hypertension: Secondary | ICD-10-CM | POA: Diagnosis not present

## 2012-12-30 DIAGNOSIS — R011 Cardiac murmur, unspecified: Secondary | ICD-10-CM | POA: Diagnosis not present

## 2012-12-30 NOTE — Progress Notes (Signed)
[facility]   2D echo completed 12/30/2012.   Cindy Brailon Don, RDCS  

## 2012-12-31 ENCOUNTER — Encounter: Payer: Self-pay | Admitting: Cardiovascular Disease

## 2013-01-31 ENCOUNTER — Emergency Department (HOSPITAL_COMMUNITY): Payer: Medicare Other

## 2013-01-31 ENCOUNTER — Encounter (HOSPITAL_COMMUNITY): Payer: Self-pay | Admitting: *Deleted

## 2013-01-31 ENCOUNTER — Emergency Department (HOSPITAL_COMMUNITY)
Admission: EM | Admit: 2013-01-31 | Discharge: 2013-01-31 | Disposition: A | Discharge status: Home / Self Care | Payer: Medicare Other | Attending: Emergency Medicine | Admitting: Emergency Medicine

## 2013-01-31 DIAGNOSIS — Z950 Presence of cardiac pacemaker: Secondary | ICD-10-CM | POA: Diagnosis not present

## 2013-01-31 DIAGNOSIS — Z7983 Long term (current) use of bisphosphonates: Secondary | ICD-10-CM | POA: Insufficient documentation

## 2013-01-31 DIAGNOSIS — M47816 Spondylosis without myelopathy or radiculopathy, lumbar region: Secondary | ICD-10-CM

## 2013-01-31 DIAGNOSIS — I10 Essential (primary) hypertension: Secondary | ICD-10-CM | POA: Diagnosis not present

## 2013-01-31 DIAGNOSIS — I6789 Other cerebrovascular disease: Secondary | ICD-10-CM | POA: Diagnosis not present

## 2013-01-31 DIAGNOSIS — IMO0002 Reserved for concepts with insufficient information to code with codable children: Secondary | ICD-10-CM | POA: Insufficient documentation

## 2013-01-31 DIAGNOSIS — Z79899 Other long term (current) drug therapy: Secondary | ICD-10-CM | POA: Insufficient documentation

## 2013-01-31 DIAGNOSIS — M5137 Other intervertebral disc degeneration, lumbosacral region: Secondary | ICD-10-CM | POA: Insufficient documentation

## 2013-01-31 DIAGNOSIS — M51379 Other intervertebral disc degeneration, lumbosacral region without mention of lumbar back pain or lower extremity pain: Secondary | ICD-10-CM | POA: Diagnosis not present

## 2013-01-31 DIAGNOSIS — M545 Low back pain, unspecified: Secondary | ICD-10-CM | POA: Diagnosis not present

## 2013-01-31 DIAGNOSIS — M549 Dorsalgia, unspecified: Secondary | ICD-10-CM

## 2013-01-31 DIAGNOSIS — M79609 Pain in unspecified limb: Secondary | ICD-10-CM | POA: Insufficient documentation

## 2013-01-31 DIAGNOSIS — Z853 Personal history of malignant neoplasm of breast: Secondary | ICD-10-CM | POA: Insufficient documentation

## 2013-01-31 DIAGNOSIS — M5416 Radiculopathy, lumbar region: Secondary | ICD-10-CM

## 2013-01-31 DIAGNOSIS — E079 Disorder of thyroid, unspecified: Secondary | ICD-10-CM | POA: Diagnosis not present

## 2013-01-31 DIAGNOSIS — R209 Unspecified disturbances of skin sensation: Secondary | ICD-10-CM | POA: Diagnosis not present

## 2013-01-31 MED ORDER — TRAMADOL HCL 50 MG PO TABS: 50.0000 mg | ORAL_TABLET | Freq: Four times a day (QID) | ORAL | Status: DC | PRN

## 2013-01-31 MED ORDER — PREDNISONE 20 MG PO TABS: 20.0000 mg | ORAL_TABLET | Freq: Two times a day (BID) | ORAL | Status: DC

## 2013-01-31 NOTE — ED Provider Notes (Signed)
CSN: [id number]     Arrival date & time 01/31/13  1030 History   First MD Initiated Contact with Patient 01/31/13 1041     Chief Complaint  Patient presents with  . Hip Pain  . Leg Pain   (Consider location/radiation/quality/duration/timing/severity/associated sxs/prior Treatment) HPI Comments: [name] is a 79 y.o. female who presents for evaluation of "hip pain". The pain is located in the left lumbar region. She denies fall or other trauma. She is using her usual medications, without relief. She denies Fever, chills, nausea, vomiting, weakness, or dizziness. There's been no urinary incontinence, fecal incontinence, dysuria, or hematuria. She denies perianal anesthesia. The pain is somewhat worse with walking. It improves with rest. There are no other modifying factors   Patient is a 79 y.o. female presenting with hip pain and leg pain. The history is provided by the patient.  Hip Pain  Leg Pain   Past Medical History  Diagnosis Date  . 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 Cataract   . Hypertension   . Thyroid disease   . Pacemaker    Past Surgical History  Procedure Laterality Date  . Abdominal hysterectomy    . Cholecystectomy    . Cataract extraction      EYE SURGERY X 2   History reviewed. No pertinent family history. History  Substance Use Topics  . Smoking status: Not on file  . Smokeless tobacco: Not on file  . Alcohol Use: Not on file   OB History   Grav Para Term Preterm Abortions TAB SAB Ect Mult Living                 Review of Systems  All other systems reviewed and are negative.    Allergies  Erythromycin and Tape  Home Medications   Current Outpatient Rx  Name  Route  Sig  Dispense  Refill  . alendronate (FOSAMAX) 35 MG tablet   Oral   Take 1 tablet (35 mg total) by mouth every 7 (seven) days. Take with a full glass of water on an empty stomach.   12 tablet   7   . aspirin 81 MG tablet   Oral    Take 81 mg by mouth daily.           . Calcium Carbonate-Vitamin D (CALCIUM 600+D PO)   Oral   Take 1 tablet by mouth 2 (two) times daily.           . cholecalciferol 2000 UNITS tablet      Take 1 capsule daily or as directed   90 tablet   7   . furosemide (LASIX) 20 MG tablet   Oral   Take 20 mg by mouth daily.           Marland Kitchen ibuprofen (ADVIL,MOTRIN) 200 MG tablet   Oral   Take 400 mg by mouth every 6 (six) hours as needed for pain.         Marland Kitchen letrozole (FEMARA) 2.5 MG tablet   Oral   Take 1 tablet (2.5 mg total) by mouth daily.   90 tablet   7   . levothyroxine (SYNTHROID, LEVOTHROID) 50 MCG tablet   Oral   Take 50 mcg by mouth daily.           . midodrine (PROAMATINE) 2.5 MG tablet   Oral   Take 2.5 mg by mouth 2 (two) times daily.         Marland Kitchen  Probiotic Product (PHILLIPS COLON HEALTH) CAPS   Oral   Take 1 capsule by mouth daily.         . simvastatin (ZOCOR) 20 MG tablet   Oral   Take 20 mg by mouth at bedtime.           . predniSONE (DELTASONE) 20 MG tablet   Oral   Take 1 tablet (20 mg total) by mouth 2 (two) times daily.   10 tablet   0   . traMADol (ULTRAM) 50 MG tablet   Oral   Take 1 tablet (50 mg total) by mouth every 6 (six) hours as needed for pain.   30 tablet   0    BP 135/68  Pulse 71  Temp(Src) 97.7 F (36.5 C) (Oral)  Resp 20  Wt 214 lb (97.07 kg)  BMI 35.06 kg/m2  SpO2 98% Physical Exam  Nursing note and vitals reviewed. Constitutional: She is oriented to person, place, and time. She appears well-developed.  Frail, elderly  HENT:  Head: Normocephalic and atraumatic.  Eyes: Conjunctivae and EOM are normal. Pupils are equal, round, and reactive to light.  Neck: Normal range of motion and phonation normal. Neck supple.  Cardiovascular: Normal rate, regular rhythm and intact distal pulses.   Pulmonary/Chest: Effort normal and breath sounds normal. She exhibits no tenderness.  Abdominal: Soft. She exhibits no distension.  There is no tenderness. There is no guarding.  Musculoskeletal: Normal range of motion.  Mild left lumbar tenderness to palpation. No limitation of range of motion. Negative straight leg raising bilaterally  Neurological: She is alert and oriented to person, place, and time. She exhibits normal muscle tone.  Skin: Skin is warm and dry.  Psychiatric: She has a normal mood and affect. Her behavior is normal. Judgment and thought content normal.    ED Course  Procedures (including critical care time) Labs Review Labs Reviewed - No data to display Imaging Review Dg Lumbar Spine Complete  01/31/2013   CLINICAL DATA:  Low back pain and left-sided leg and hip pain.  EXAM: LUMBAR SPINE - COMPLETE 4+ VIEW  COMPARISON:  None.  FINDINGS: There is evidence of lumbar disc disease with moderate disc space narrowing and proliferative changes present at L4-5. Mild osteophyte formation and facet hypertrophy is noted at other levels. No acute fracture or subluxation is seen. No bony lesions are identified.  IMPRESSION: Moderate lumbar disc disease at L4-5.   Electronically Signed   By: Irish Lack M.D.   On: 01/31/2013 12:23    MDM   1. Back pain   2. 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 Mild symptoms of lumbar radiculopathy. Doubt spinal stenosis, lumbar myelopathy, discitis, fracture or occult infection. She is stable for discharge with outpatient management.  Nursing Notes Reviewed/ Care Coordinated, and agree without changes. Applicable Imaging Reviewed.  Interpretation of Laboratory Data incorporated into ED treatment   Plan: Home Medications- prednisone, tramadol; Home Treatments and Observation- rest, heat; return here if the recommended treatment, does not improve the symptoms; Recommended follow up- PCP check up 1 week      Flint Melter, MD 01/31/13 1408

## 2013-01-31 NOTE — ED Notes (Signed)
Patient transported to X-ray 

## 2013-02-03 ENCOUNTER — Telehealth: Payer: Self-pay | Admitting: Cardiovascular Disease

## 2013-02-03 NOTE — Telephone Encounter (Signed)
Called and spoke with the patient. I asked her why she already needed refills of Tramadol and Prednisone, she got the Rx(s) from the ED on 01/31/2013 for hip and leg pain. She stated she hasn't completed the medication but didn't want to run out of medicine because she was doing so well. Patient does not have a PCP and is in the process of getting one.   Per Nada Boozer, NP, the hip/leg pain is not a cardiac issue and it is an issue for PCP. She will need to see her primary or go to urgent care if pain persists.  Called the patient back and notified her that we would not be refilling these medications and that these were one-time treatment medications. I told her to finish the medications and her pain should clear up. If her pain does not resolve, I encouraged her to go to [facility] and let her know that they could also be her PCP.   Patient voiced understanding.

## 2013-02-03 NOTE — Telephone Encounter (Signed)
Please call-need new prescriptions for medicine she got while she was in the hospital.Dr Alanda Amass was her doctor

## 2013-02-05 ENCOUNTER — Ambulatory Visit (INDEPENDENT_AMBULATORY_CARE_PROVIDER_SITE_OTHER): Payer: Medicare Other | Admitting: Family Medicine

## 2013-02-05 VITALS — BP 122/82 | HR 88 | Temp 98.1°F | Resp 16 | Ht 66.0 in | Wt 227.8 lb

## 2013-02-05 DIAGNOSIS — Z79899 Other long term (current) drug therapy: Secondary | ICD-10-CM

## 2013-02-05 DIAGNOSIS — M545 Low back pain, unspecified: Secondary | ICD-10-CM

## 2013-02-05 DIAGNOSIS — M5430 Sciatica, unspecified side: Secondary | ICD-10-CM

## 2013-02-05 DIAGNOSIS — M5432 Sciatica, left side: Secondary | ICD-10-CM

## 2013-02-05 LAB — GLUCOSE, POCT (MANUAL RESULT ENTRY): POC Glucose: 111 mg/dl — AB (ref 70–99)

## 2013-02-05 MED ORDER — PREDNISONE 20 MG PO TABS: 20.0000 mg | ORAL_TABLET | Freq: Two times a day (BID) | ORAL | Status: DC

## 2013-02-05 NOTE — Patient Instructions (Signed)
Your leg pain appears to be from your back, and a possible condition called sciatica.  See below. If not improving in next few days, can repeat prednisone treatment, and ultram if needed, but follow up with primary care in 1 week as planned. Return to the clinic or go to the nearest emergency room if any of your symptoms worsen or new symptoms occur. Sciatica Sciatica is pain, weakness, numbness, or tingling along the path of the sciatic nerve. The nerve starts in the lower back and runs down the back of each leg. The nerve controls the muscles in the lower leg and in the back of the knee, while also providing sensation to the back of the thigh, lower leg, and the sole of your foot. Sciatica is a symptom of another medical condition. For instance, nerve damage or certain conditions, such as a herniated disk or bone spur on the spine, pinch or put pressure on the sciatic nerve. This causes the pain, weakness, or other sensations normally associated with sciatica. Generally, sciatica only affects one side of the body. CAUSES   Herniated or slipped disc.  Degenerative disk disease.  A pain disorder involving the narrow muscle in the buttocks (piriformis syndrome).  Pelvic injury or fracture.  Pregnancy.  Tumor (rare). SYMPTOMS  Symptoms can vary from mild to very severe. The symptoms usually travel from the low back to the buttocks and down the back of the leg. Symptoms can include:  Mild tingling or dull aches in the lower back, leg, or hip.  Numbness in the back of the calf or sole of the foot.  Burning sensations in the lower back, leg, or hip.  Sharp pains in the lower back, leg, or hip.  Leg weakness.  Severe back pain inhibiting movement. These symptoms may get worse with coughing, sneezing, laughing, or prolonged sitting or standing. Also, being overweight may worsen symptoms. DIAGNOSIS  Your caregiver will perform a physical exam to look for common symptoms of sciatica. He or she  may ask you to do certain movements or activities that would trigger sciatic nerve pain. Other tests may be performed to find the cause of the sciatica. These may include:  Blood tests.  X-rays.  Imaging tests, such as an MRI or CT scan. TREATMENT  Treatment is directed at the cause of the sciatic pain. Sometimes, treatment is not necessary and the pain and discomfort goes away on its own. If treatment is needed, your caregiver may suggest:  Over-the-counter medicines to relieve pain.  Prescription medicines, such as anti-inflammatory medicine, muscle relaxants, or narcotics.  Applying heat or ice to the painful area.  Steroid injections to lessen pain, irritation, and inflammation around the nerve.  Reducing activity during periods of pain.  Exercising and stretching to strengthen your abdomen and improve flexibility of your spine. Your caregiver may suggest losing weight if the extra weight makes the back pain worse.  Physical therapy.  Surgery to eliminate what is pressing or pinching the nerve, such as a bone spur or part of a herniated disk. HOME CARE INSTRUCTIONS   Only take over-the-counter or prescription medicines for pain or discomfort as directed by your caregiver.  Apply ice to the affected area for 20 minutes, 3 4 times a day for the first 48 72 hours. Then try heat in the same way.  Exercise, stretch, or perform your usual activities if these do not aggravate your pain.  Attend physical therapy sessions as directed by your caregiver.  Keep all follow-up  appointments as directed by your caregiver.  Do not wear high heels or shoes that do not provide proper support.  Check your mattress to see if it is too soft. A firm mattress may lessen your pain and discomfort. SEEK IMMEDIATE MEDICAL CARE IF:   You lose control of your bowel or bladder (incontinence).  You have increasing weakness in the lower back, pelvis, buttocks, or legs.  You have redness or swelling  of your back.  You have a burning sensation when you urinate.  You have pain that gets worse when you lie down or awakens you at night.  Your pain is worse than you have experienced in the past.  Your pain is lasting longer than 4 weeks.  You are suddenly losing weight without reason. MAKE SURE YOU:  Understand these instructions.  Will watch your condition.  Will get help right away if you are not doing well or get worse. Document Released: 04/09/2001 Document Revised: 10/15/2011 Document Reviewed: 08/25/2011 [facility] Patient Information 2014 [place].

## 2013-02-05 NOTE — Progress Notes (Signed)
This chart was scribed for [employer]. Nichole Seat, MD by Leone Payor, ED Scribe. This patient was seen in room Room/bed info not found and the patient's care was started 8:26 AM.  Subjective:    Patient ID: [name], female    DOB: 06-16-1927, 79 y.o.   MRN: [id number]  HPI  HPI Comments: [name] is a 79 y.o. female who presents requesting medication refill today. She was seen 01/31/14 in ED for left low back pain, left leg and hip pain. She was diagnosed with lumbar radiculopathy and degenerative disc disease of lumbar spine. She was treated with prednisone and tramadol. Phone call noted with cardio for med refills. She was encouraged to be seen by PCP.   Pt states she was seen in the ED for left low back pain that radiated to the left leg. She was prescribed prednisone 20 mg 1 pill twice per day for 5 days but took 1 pill once per day and only occasionally twice per day. She states her symptoms are improving since being seen in the ED. She states her pain was a 9/10 in the ED and 3/10 currently. Pt states she has been having this low back pain that has been ongoing for 3-4 months but has worsened 5 days ago. Pt states she also had a minor fall off the bed but states she had this back pain prior to the fall. She denies anal or genital numbness, lower extremity numbness, loss of bowel or bladder function, weakness, rash.   XRAY of Lumbar spine shows moderate lumbar disc disease at L4, 5. In ER.   Cardiology [facility]. Marland Kitchen She has a pacemaker.  Plans on transitioning to Dr. [place]. Dr. Jeannetta Nap will be her follow up on 02/12/13 for PCP.    Past Medical History  Diagnosis Date  . 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 Cataract   . Hypertension   . Thyroid disease   . Pacemaker    Past Surgical History  Procedure Laterality Date  . Abdominal hysterectomy    . Cholecystectomy    . Cataract extraction      EYE SURGERY X 2   Allergies  Allergen Reactions  .  Erythromycin Hives  . Tape     BAND AIDS-SKIN IRRITATION      Review of Systems  Musculoskeletal: Positive for back pain.  Skin: Negative for rash.  Neurological: Negative for weakness and numbness.       Objective:   Physical Exam  Nursing note and vitals reviewed. Constitutional: She is oriented to person, place, and time. She appears well-developed and well-nourished.  HENT:  Head: Normocephalic and atraumatic.  Cardiovascular: Normal rate, regular rhythm and normal heart sounds.  Exam reveals no gallop and no friction rub.   No murmur heard. Pulmonary/Chest: Effort normal and breath sounds normal. No respiratory distress. She has no wheezes. She has no rales. She exhibits no tenderness.  Musculoskeletal: Normal range of motion. She exhibits tenderness. She exhibits no edema.       Left hip: Normal. She exhibits normal range of motion (unable to reproduce pain with hip ROM, ), normal strength, no tenderness and no bony tenderness.       Lumbar back: She exhibits normal range of motion, no bony tenderness, no deformity and no spasm.       Back:  Sight tenderness to paraspinal muscles of L4, 5. No midline bony tenderness. Flexion to about 90 degrees. Intact extension and lateral  flexion of lumbar spine. Rotation intact and pain-free. Heel and toe walk normal.   No lower extremity edema.   Neurological: She is alert and oriented to person, place, and time. She has normal strength. No sensory deficit. She displays no Babinski's sign on the right side. She displays no Babinski's sign on the left side.  Reflex Scores:      Patellar reflexes are 1+ on the right side and 1+ on the left side.      Achilles reflexes are 2+ on the right side and 2+ on the left side. Patellar reflexes 1+ bilaterally, achilles 2+ bilaterally. Babinski neg bilaterally.  Skin: No rash noted.   Results for orders placed in visit on 02/05/13  GLUCOSE, POCT (MANUAL RESULT ENTRY)      Result Value Range    POC Glucose 111 (*) 70 - 99 mg/dl        Assessment & Plan:  [name] is a 79 y.o. female LBP radiating to left leg -Sciatica neuralgia, left - Plan: predniSONE (DELTASONE) 20 MG tablet - improved after prednisone , but partial/extended dosing. Continue tramadol if needed, and if not continuing to improve in next 2 days - can repeat prednisone dose of 10mg  BID for 5 days. SED, and RTC/ER precautions given.   High risk medication use - Plan: POCT glucose (manual entry), predniSONE (DELTASONE) 20 MG tablet. nonfasting glucose - ok. Prior CMp reviewed.   Meds ordered this encounter  Medications  . predniSONE (DELTASONE) 20 MG tablet    Sig: Take 1 tablet (20 mg total) by mouth 2 (two) times daily.    Dispense:  10 tablet    Refill:  0   Patient Instructions  Your leg pain appears to be from your back, and a possible condition called sciatica.  See below. If not improving in next few days, can repeat prednisone treatment, and ultram if needed, but follow up with primary care in 1 week as planned. Return to the clinic or go to the nearest emergency room if any of your symptoms worsen or new symptoms occur. Sciatica Sciatica is pain, weakness, numbness, or tingling along the path of the sciatic nerve. The nerve starts in the lower back and runs down the back of each leg. The nerve controls the muscles in the lower leg and in the back of the knee, while also providing sensation to the back of the thigh, lower leg, and the sole of your foot. Sciatica is a symptom of another medical condition. For instance, nerve damage or certain conditions, such as a herniated disk or bone spur on the spine, pinch or put pressure on the sciatic nerve. This causes the pain, weakness, or other sensations normally associated with sciatica. Generally, sciatica only affects one side of the body. CAUSES   Herniated or slipped disc.  Degenerative disk disease.  A pain disorder involving the narrow muscle in the  buttocks (piriformis syndrome).  Pelvic injury or fracture.  Pregnancy.  Tumor (rare). SYMPTOMS  Symptoms can vary from mild to very severe. The symptoms usually travel from the low back to the buttocks and down the back of the leg. Symptoms can include:  Mild tingling or dull aches in the lower back, leg, or hip.  Numbness in the back of the calf or sole of the foot.  Burning sensations in the lower back, leg, or hip.  Sharp pains in the lower back, leg, or hip.  Leg weakness.  Severe back pain inhibiting movement. These symptoms  may get worse with coughing, sneezing, laughing, or prolonged sitting or standing. Also, being overweight may worsen symptoms. DIAGNOSIS  Your caregiver will perform a physical exam to look for common symptoms of sciatica. He or she may ask you to do certain movements or activities that would trigger sciatic nerve pain. Other tests may be performed to find the cause of the sciatica. These may include:  Blood tests.  X-rays.  Imaging tests, such as an MRI or CT scan. TREATMENT  Treatment is directed at the cause of the sciatic pain. Sometimes, treatment is not necessary and the pain and discomfort goes away on its own. If treatment is needed, your caregiver may suggest:  Over-the-counter medicines to relieve pain.  Prescription medicines, such as anti-inflammatory medicine, muscle relaxants, or narcotics.  Applying heat or ice to the painful area.  Steroid injections to lessen pain, irritation, and inflammation around the nerve.  Reducing activity during periods of pain.  Exercising and stretching to strengthen your abdomen and improve flexibility of your spine. Your caregiver may suggest losing weight if the extra weight makes the back pain worse.  Physical therapy.  Surgery to eliminate what is pressing or pinching the nerve, such as a bone spur or part of a herniated disk. HOME CARE INSTRUCTIONS   Only take over-the-counter or  prescription medicines for pain or discomfort as directed by your caregiver.  Apply ice to the affected area for 20 minutes, 3 4 times a day for the first 48 72 hours. Then try heat in the same way.  Exercise, stretch, or perform your usual activities if these do not aggravate your pain.  Attend physical therapy sessions as directed by your caregiver.  Keep all follow-up appointments as directed by your caregiver.  Do not wear high heels or shoes that do not provide proper support.  Check your mattress to see if it is too soft. A firm mattress may lessen your pain and discomfort. SEEK IMMEDIATE MEDICAL CARE IF:   You lose control of your bowel or bladder (incontinence).  You have increasing weakness in the lower back, pelvis, buttocks, or legs.  You have redness or swelling of your back.  You have a burning sensation when you urinate.  You have pain that gets worse when you lie down or awakens you at night.  Your pain is worse than you have experienced in the past.  Your pain is lasting longer than 4 weeks.  You are suddenly losing weight without reason. MAKE SURE YOU:  Understand these instructions.  Will watch your condition.  Will get help right away if you are not doing well or get worse. Document Released: 04/09/2001 Document Revised: 10/15/2011 Document Reviewed: 08/25/2011 [facility] Patient Information 2014 [place], [place].      I personally performed the services described in this documentation, which was scribed in my presence. The recorded information has been reviewed and considered, and addended by me as needed.

## 2013-02-12 DIAGNOSIS — Z23 Encounter for immunization: Secondary | ICD-10-CM | POA: Diagnosis not present

## 2013-02-12 DIAGNOSIS — M5430 Sciatica, unspecified side: Secondary | ICD-10-CM | POA: Diagnosis not present

## 2013-02-23 ENCOUNTER — Telehealth: Payer: Self-pay | Admitting: Cardiology

## 2013-02-23 NOTE — Telephone Encounter (Signed)
Pt Dropped Off Records For Dr.Jordan These Were Given to Scheduling Dept For 10/31 appt 02/23/13/km

## 2013-02-26 ENCOUNTER — Encounter: Payer: Self-pay | Admitting: Cardiology

## 2013-02-26 ENCOUNTER — Ambulatory Visit (INDEPENDENT_AMBULATORY_CARE_PROVIDER_SITE_OTHER): Payer: Medicare Other | Admitting: Cardiology

## 2013-02-26 VITALS — BP 118/80 | HR 86 | Ht 66.0 in | Wt 218.0 lb

## 2013-02-26 DIAGNOSIS — Z950 Presence of cardiac pacemaker: Secondary | ICD-10-CM

## 2013-02-26 DIAGNOSIS — I442 Atrioventricular block, complete: Secondary | ICD-10-CM | POA: Diagnosis not present

## 2013-02-26 NOTE — Progress Notes (Signed)
[name] Date of Birth: May 06, 1927 Medical Record #[id number]  History of Present Illness: [name] is seen today at the request of Dr. [name] to establish cardiac care. She is a former patient of Dr. Alanda Amass. She has a history of complete heart block with 12 second pulse and underwent permanent pacemaker implant on 10/09/2007 with a [employer] adapta VVI pacemaker. She denies any other cardiac problems. She reports a 6-12 months ago she developed symptoms of fainting. She was started on midodrine with complete resolution of her symptoms. She has no history of coronary disease or congestive heart failure. Her last pacemaker check in August was satisfactory.  Current Outpatient Prescriptions on File Prior to Visit  Medication Sig Dispense Refill  . alendronate (FOSAMAX) 35 MG tablet Take 1 tablet (35 mg total) by mouth every 7 (seven) days. Take with a full glass of water on an empty stomach.  12 tablet  7  . aspirin 81 MG tablet Take 81 mg by mouth daily.        . Calcium Carbonate-Vitamin D (CALCIUM 600+D PO) Take 1 tablet by mouth 2 (two) times daily.        . cholecalciferol 2000 UNITS tablet Take 1 capsule daily or as directed  90 tablet  7  . furosemide (LASIX) 20 MG tablet Take 20 mg by mouth daily.        Marland Kitchen ibuprofen (ADVIL,MOTRIN) 200 MG tablet Take 400 mg by mouth every 6 (six) hours as needed for pain.      Marland Kitchen letrozole (FEMARA) 2.5 MG tablet Take 1 tablet (2.5 mg total) by mouth daily.  90 tablet  7  . levothyroxine (SYNTHROID, LEVOTHROID) 50 MCG tablet Take 50 mcg by mouth daily.        . midodrine (PROAMATINE) 2.5 MG tablet Take 2.5 mg by mouth 2 (two) times daily.      . predniSONE (DELTASONE) 20 MG tablet Take 1 tablet (20 mg total) by mouth 2 (two) times daily.  10 tablet  0  . Probiotic Product (PHILLIPS COLON HEALTH) CAPS Take 1 capsule by mouth daily.      . simvastatin (ZOCOR) 20 MG tablet Take 20 mg by mouth at bedtime.        . traMADol (ULTRAM) 50 MG tablet  Take 1 tablet (50 mg total) by mouth every 6 (six) hours as needed for pain.  30 tablet  0   No current facility-administered medications on file prior to visit.    Allergies  Allergen Reactions  . Erythromycin Hives  . Tape     BAND AIDS-SKIN IRRITATION    Past Medical History  Diagnosis Date  . 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 Cataract   . Hypercholesterolemia   . Thyroid disease   . Pacemaker   . S/P placement of cardiac pacemaker 02/26/2013    Past Surgical History  Procedure Laterality Date  . Abdominal hysterectomy    . Cholecystectomy    . Cataract extraction      EYE SURGERY X 2    History  Smoking status  . Never Smoker   Smokeless tobacco  . Not on file    History  Alcohol Use No    Family History  Problem Relation Age of Onset  . Heart disease Maternal Grandmother     Review of Systems: As noted in history of present illness.  All other systems were reviewed and are negative.  Physical Exam: BP 118/80  Pulse 86  Ht 5\' 6"  (1.676 m)  Wt 218 lb (98.884 kg)  BMI 35.2 kg/m2 She is a morbidly obese, elderly white female in no acute distress. HEENT: Normocephalic, atraumatic. Pupils equal round and reactive light accommodation. Extraocular movements are full. Oropharynx is clear. Neck is without JVD, adenopathy, thyromegaly, or bruits. Lungs: Clear Cardiovascular: Regular rate and rhythm without gallop, murmur, or click. Her pacemaker site in the left subclavian area is normal. Abdomen: Obese, soft, nontender. No masses or hepatosplenomegaly. Extremities: No cyanosis or edema. Pulses are 2+ and symmetric. Skin: Warm and dry Neuro: Alert and oriented x3. Cranial nerves II through XII are intact.  LABORATORY DATA: ECG from August 2014 shows ventricular paced rhythm.  Echocardiogram on 12/30/2012 shows an LVH with normal systolic function. Ejection fraction is estimated at 50-55% there was mild septal dyssynchrony  consistent with her paced rhythm. . Assessment / Plan: 1. History complete heart block status post permanent VVIR pacemaker placement in June of 2009. Clinically doing well. We will have the patient establish with our pacemaker clinic followup. I'll followup again in one year.  2. History of orthostatic hypotension-improved with midodrine.  3. Hyperlipidemia on statin therapy.

## 2013-02-26 NOTE — Patient Instructions (Signed)
We will get you established in our pacemaker clinic  I will see you in one year.

## 2013-03-29 ENCOUNTER — Telehealth: Payer: Self-pay | Admitting: Oncology

## 2013-03-29 ENCOUNTER — Encounter: Payer: Self-pay | Admitting: Oncology

## 2013-03-29 ENCOUNTER — Other Ambulatory Visit (HOSPITAL_BASED_OUTPATIENT_CLINIC_OR_DEPARTMENT_OTHER): Payer: Medicare Other | Admitting: Lab

## 2013-03-29 ENCOUNTER — Ambulatory Visit (HOSPITAL_BASED_OUTPATIENT_CLINIC_OR_DEPARTMENT_OTHER): Payer: Medicare Other | Admitting: Oncology

## 2013-03-29 VITALS — BP 123/79 | HR 98 | Temp 97.8°F | Resp 18 | Ht 66.0 in | Wt 266.8 lb

## 2013-03-29 DIAGNOSIS — C50919 Malignant neoplasm of unspecified site of unspecified female breast: Secondary | ICD-10-CM | POA: Diagnosis not present

## 2013-03-29 DIAGNOSIS — C649 Malignant neoplasm of unspecified kidney, except renal pelvis: Secondary | ICD-10-CM

## 2013-03-29 DIAGNOSIS — C50319 Malignant neoplasm of lower-inner quadrant of unspecified female breast: Secondary | ICD-10-CM | POA: Diagnosis not present

## 2013-03-29 LAB — CBC WITH DIFFERENTIAL/PLATELET
BASO%: 1.2 % (ref 0.0–2.0)
EOS%: 5.4 % (ref 0.0–7.0)
Eosinophils Absolute: 0.5 10*3/uL (ref 0.0–0.5)
MCHC: 32.3 g/dL (ref 31.5–36.0)
MCV: 95.4 fL (ref 79.5–101.0)
MONO#: 0.9 10*3/uL (ref 0.1–0.9)
NEUT#: 5.9 10*3/uL (ref 1.5–6.5)
RBC: 4.68 10*6/uL (ref 3.70–5.45)
RDW: 14.1 % (ref 11.2–14.5)
WBC: 9 10*3/uL (ref 3.9–10.3)
lymph#: 1.6 10*3/uL (ref 0.9–3.3)

## 2013-03-29 LAB — COMPREHENSIVE METABOLIC PANEL (CC13)
ALT: 14 U/L (ref 0–55)
Albumin: 3.5 g/dL (ref 3.5–5.0)
Alkaline Phosphatase: 101 U/L (ref 40–150)
Anion Gap: 12 mEq/L — ABNORMAL HIGH (ref 3–11)
CO2: 28 mEq/L (ref 22–29)
Calcium: 10.1 mg/dL (ref 8.4–10.4)
Chloride: 105 mEq/L (ref 98–109)
Glucose: 84 mg/dl (ref 70–140)
Potassium: 3.9 mEq/L (ref 3.5–5.1)
Sodium: 145 mEq/L (ref 136–145)
Total Bilirubin: 0.39 mg/dL (ref 0.20–1.20)
Total Protein: 7 g/dL (ref 6.4–8.3)

## 2013-03-29 NOTE — Progress Notes (Signed)
OFFICE PROGRESS NOTE  CC  [name], MD [address] [place] [address]  DIAGNOSIS: 79 year old female with history of bilateral breast cancers status post bilateral lumpectomies January 2009   PRIOR THERAPY:  #79 year old woman who has a history of bilateral breast cancer she underwent bilateral lumpectomies on 05/05/2007 followed by radiation therapy to both breasts which he completed 09/10/2007. She subsequently was started on Femara 2.5 mg daily she continues on this.  #2patient also has history of renal cell carcinoma status post nephrectomy in May 2009 on observation.  CURRENT THERAPY:Femara 2.5 mg daily  INTERVAL HISTORY: [name] 79 y.o. female returns for followup visit today Clinically patient seems to be doing well. She has no nausea no vomiting no fevers no aches or pains her hot flashes.remainder of the 10 point review of systems is negative.  MEDICAL HISTORY: Past Medical History  Diagnosis Date  . 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 Cataract   . Hypercholesterolemia   . Thyroid disease   . Pacemaker   . S/P placement of cardiac pacemaker 02/26/2013    ALLERGIES:  is allergic to erythromycin and tape.  MEDICATIONS:  Current Outpatient Prescriptions  Medication Sig Dispense Refill  . alendronate (FOSAMAX) 35 MG tablet Take 1 tablet (35 mg total) by mouth every 7 (seven) days. Take with a full glass of water on an empty stomach.  12 tablet  7  . aspirin 81 MG tablet Take 81 mg by mouth daily.        . Calcium Carbonate-Vitamin D (CALCIUM 600+D PO) Take 1 tablet by mouth daily.       . cholecalciferol 2000 UNITS tablet Take 1 capsule daily or as directed  90 tablet  7  . furosemide (LASIX) 20 MG tablet Take 20 mg by mouth daily.        Marland Kitchen ibuprofen (ADVIL,MOTRIN) 200 MG tablet Take 400 mg by mouth every 6 (six) hours as needed for pain.      Marland Kitchen letrozole (FEMARA) 2.5 MG tablet Take 1 tablet (2.5  mg total) by mouth daily.  90 tablet  7  . levothyroxine (SYNTHROID, LEVOTHROID) 50 MCG tablet Take 50 mcg by mouth daily.        . midodrine (PROAMATINE) 2.5 MG tablet Take 2.5 mg by mouth 2 (two) times daily.      . predniSONE (DELTASONE) 20 MG tablet Take 20 mg by mouth 2 (two) times daily. Pt advised she takes  1 a day on days with the " worst pain"      . Probiotic Product (PHILLIPS COLON HEALTH) CAPS Take 1 capsule by mouth daily.      . simvastatin (ZOCOR) 20 MG tablet Take 20 mg by mouth at bedtime. Per pt- takes 3-4 times a month      . traMADol (ULTRAM) 50 MG tablet Take 1 tablet (50 mg total) by mouth every 6 (six) hours as needed for pain.  30 tablet  0   No current facility-administered medications for this visit.    SURGICAL HISTORY:  Past Surgical History  Procedure Laterality Date  . Abdominal hysterectomy    . Cholecystectomy    . Cataract extraction      EYE SURGERY X 2    REVIEW OF SYSTEMS:  Pertinent items are noted in HPI.   HEALTH MAINTENANCE:    PHYSICAL EXAMINATION: Blood pressure 123/79, pulse 98, temperature 97.8 F (36.6 C), temperature source Oral, resp. rate 18,  height 5\' 6"  (1.676 m), weight 266 lb 12.8 oz (121.02 kg). Body mass index is 43.08 kg/(m^2). ECOG PERFORMANCE STATUS: 1 - Symptomatic but completely ambulatory   well-developed well-nourished elderly female in no acute distress HEENT exam oral mucosa is moist neck supple lungs clear cardiovascular regular rate rhythm abdomen soft no HSM extremities +1 edema neuro is nonfocal bilateral breast exam reveals well-healed surgical scars no masses nipple discharge.   LABORATORY DATA: Lab Results  Component Value Date   WBC 9.0 03/29/2013   HGB 14.4 03/29/2013   HCT 44.6 03/29/2013   MCV 95.4 03/29/2013   PLT 288 03/29/2013      Chemistry      Component Value Date/Time   NA 145 03/29/2013 0925   NA 141 12/22/2012 0858   K 3.9 03/29/2013 0925   K 4.3 12/22/2012 0858   CL 104 12/22/2012 0858   CL  106 03/03/2012 0831   CO2 28 03/29/2013 0925   CO2 27 12/22/2012 0858   BUN 17.9 03/29/2013 0925   BUN 18 12/22/2012 0858   CREATININE 1.2* 03/29/2013 0925   CREATININE 1.23* 12/22/2012 0858   CREATININE 1.16* 09/18/2011 0832      Component Value Date/Time   CALCIUM 10.1 03/29/2013 0925   CALCIUM 10.3 12/22/2012 0858   ALKPHOS 101 03/29/2013 0925   ALKPHOS 81 12/22/2012 0858   AST 14 03/29/2013 0925   AST 15 12/22/2012 0858   ALT 14 03/29/2013 0925   ALT 13 12/22/2012 0858   BILITOT 0.39 03/29/2013 0925   BILITOT 0.6 12/22/2012 0858       RADIOGRAPHIC STUDIES:  No results found.  ASSESSMENT: 79 year old female with  #1 bilateral breast cancers diagnosed January 2009 status post bilateral lumpectomies followed by radiation. She is now on letrozole 2.5 mg daily tolerating it well. Total of 6-7 years of therapy is planned.  #2 renal cell carcinoma vision is status post nephrectomy she is followed by Dr. Laverle Patter.   PLAN:   #1 continue letrozole. [facility]  #2 I will see her back in 6 months time for followup.   All questions were answered. The patient knows to call the clinic with any problems, questions or concerns. We can certainly see the patient much sooner if necessary.  I spent 15 minutes counseling the patient face to face. The total time spent in the appointment was 20 minutes.    Drue Second, MD Medical/Oncology [facility] [phone] (beeper) [phone] (Office)  03/29/2013, 11:34 AM

## 2013-04-05 ENCOUNTER — Ambulatory Visit (INDEPENDENT_AMBULATORY_CARE_PROVIDER_SITE_OTHER): Payer: Medicare Other | Admitting: Internal Medicine

## 2013-04-05 ENCOUNTER — Encounter: Payer: Self-pay | Admitting: Internal Medicine

## 2013-04-05 VITALS — BP 118/72 | HR 72 | Ht 66.0 in | Wt 269.0 lb

## 2013-04-05 DIAGNOSIS — I442 Atrioventricular block, complete: Secondary | ICD-10-CM

## 2013-04-05 DIAGNOSIS — R42 Dizziness and giddiness: Secondary | ICD-10-CM | POA: Diagnosis not present

## 2013-04-05 DIAGNOSIS — Z950 Presence of cardiac pacemaker: Secondary | ICD-10-CM

## 2013-04-05 LAB — MDC_IDC_ENUM_SESS_TYPE_INCLINIC
Battery Voltage: 2.74 V
Brady Statistic RV Percent Paced: 100 %
Lead Channel Impedance Value: 385 Ohm
Lead Channel Pacing Threshold Amplitude: 1 V
Lead Channel Setting Pacing Amplitude: 2 V
Lead Channel Setting Pacing Pulse Width: 0.4 ms

## 2013-04-05 NOTE — Progress Notes (Signed)
Nichole Rooks, MD: Primary Cardiologist:  [name]  [name] is a 79 y.o. female with a h/o complete heart block sp PPM (MDT) by Nichole [name] who presents today to establish care in the Electrophysiology device clinic.  The patient reports doing very well since having a pacemaker implanted and remains very active despite her age.  She had some postural syncope several months ago which resolved with midodrine.  She is pleased with her present health state.  Today, she  denies symptoms of palpitations, chest pain, shortness of breath, orthopnea, PND, lower extremity edema, dizziness, presyncope, syncope, or neurologic sequela.  The patientis tolerating medications without difficulties and is otherwise without complaint today.   Past Medical History  Diagnosis Date  . 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 Cataract   . Hypercholesterolemia   . Thyroid disease   . Complete heart block     s/p PPM implant (MDT) by Nichole [name].  Atrial lead could not be paced at time of the procedure.  She has chronic AV dysociation  . Orthostatic hypotension     treated with midodrine by Nichole [name]   Past Surgical History  Procedure Laterality Date  . Abdominal hysterectomy    . Cholecystectomy    . Cataract extraction      EYE SURGERY X 2  . Pacemaker insertion      MDT implanted by Nichole [name].  Atrial lead placement was unsuccessful.  She has chronic AV dysociation    History   Social History  . Marital Status: Married    Spouse Name: N/A    Number of Children: N/A  . Years of Education: N/A   Occupational History  . Not on file.   Social History Main Topics  . Smoking status: Never Smoker   . Smokeless tobacco: Not on file  . Alcohol Use: No  . Drug Use: No  . Sexual Activity: Not Currently   Other Topics Concern  . Not on file   Social History Narrative  . No narrative on file    Family History  Problem Relation Age of Onset  .  Heart disease Maternal Grandmother     Allergies  Allergen Reactions  . Erythromycin Hives  . Tape     BAND AIDS-SKIN IRRITATION    Current Outpatient Prescriptions  Medication Sig Dispense Refill  . alendronate (FOSAMAX) 35 MG tablet Take 1 tablet (35 mg total) by mouth every 7 (seven) days. Take with a full glass of water on an empty stomach.  12 tablet  7  . aspirin 81 MG tablet Take 81 mg by mouth daily.        . Calcium Carbonate-Vitamin D (CALCIUM 600+D PO) Take 1 tablet by mouth daily.       . cholecalciferol 2000 UNITS tablet Take 1 capsule daily or as directed  90 tablet  7  . furosemide (LASIX) 20 MG tablet Take 20 mg by mouth daily.        Marland Kitchen ibuprofen (ADVIL,MOTRIN) 200 MG tablet Take 400 mg by mouth every 6 (six) hours as needed for pain.      Marland Kitchen letrozole (FEMARA) 2.5 MG tablet Take 1 tablet (2.5 mg total) by mouth daily.  90 tablet  7  . levothyroxine (SYNTHROID, LEVOTHROID) 50 MCG tablet Take 50 mcg by mouth daily.        . midodrine (PROAMATINE) 2.5 MG tablet Take 2.5 mg by mouth 2 (two) times daily.      Marland Kitchen  predniSONE (DELTASONE) 20 MG tablet Take 20 mg by mouth 2 (two) times daily. Pt advised she takes  1 a day on days with the " worst pain"      . Probiotic Product (PHILLIPS COLON HEALTH) CAPS Take 1 capsule by mouth daily.      . simvastatin (ZOCOR) 20 MG tablet Take 20 mg by mouth at bedtime. Per pt- takes 3-4 times a month      . traMADol (ULTRAM) 50 MG tablet Take 1 tablet (50 mg total) by mouth every 6 (six) hours as needed for pain.  30 tablet  0   No current facility-administered medications for this visit.    ROS- all systems are reviewed and negative except as per HPI  Physical Exam: Filed Vitals:   04/05/13 0959  BP: 118/72  Pulse: 72  Height: 5\' 6"  (1.676 m)  Weight: 269 lb (122.018 kg)    GEN- The patient is well appearing, alert and oriented x 3 today.   Head- normocephalic, atraumatic Eyes-  Sclera clear, conjunctiva pink Ears- hearing  intact Oropharynx- clear Neck- supple, no JVP Lymph- no cervical lymphadenopathy Lungs- Clear to ausculation bilaterally, normal work of breathing Chest- pacemaker pocket is well healed Heart- Regular rate and rhythm (paced) GI- soft, NT, ND, + BS Extremities- no clubbing, cyanosis, or edema MS- age appropriate muscle atrophy Skin- no rash or lesion Psych- euthymic mood, full affect Neuro- strength and sensation are intact  Pacemaker interrogation- reviewed in detail today,  See PACEART report  Assessment and Plan:  1. Complete heart block Normal pacemaker function See Pace Art report No changes today She has a VVI device despite sinus rhythm.  I have reviewed Nichole [place] procedure note which indicates that atrial lead placement was not successful.  Today, we discussed the possibility of upgrade to a dual chamber device.  She is clear that she is presently doing well and would not want this to be performed.  We will therefore continue our current pacing strategy  2. Postural dizziness Stable Consider weaning midodrine in the future.  It might be that if we could get her off of lasix that her midodrine could be eventually discontinued.  Carelink every 3 months Return to see me in 1 year

## 2013-04-05 NOTE — Patient Instructions (Signed)
Remote monitoring is used to monitor your Pacemaker of ICD from home. This monitoring reduces the number of office visits required to check your device to one time per year. It allows [place] to keep an eye on the functioning of your device to ensure it is working properly. You are scheduled for a device check from home on July 07, 2013. You may send your transmission at any time that day. If you have a wireless device, the transmission will be sent automatically. After your physician reviews your transmission, you will receive a postcard with your next transmission date.  Your physician wants you to follow-up in: 1 year with Dr Johney Frame.  You will receive a reminder letter in the mail two months in advance. If you don't receive a letter, please call our office to schedule the follow-up appointment.

## 2013-04-06 DIAGNOSIS — L723 Sebaceous cyst: Secondary | ICD-10-CM | POA: Diagnosis not present

## 2013-04-14 ENCOUNTER — Telehealth: Payer: Self-pay | Admitting: Internal Medicine

## 2013-04-14 NOTE — Telephone Encounter (Signed)
New problem    Pt called to say she machine is not working.  [employer] came in an unplugged it.   Pt will call the 800 number on the machine.  It any more problems she will call us back.

## 2013-05-24 ENCOUNTER — Other Ambulatory Visit (HOSPITAL_COMMUNITY): Payer: Self-pay | Admitting: Family Medicine

## 2013-05-24 DIAGNOSIS — L98499 Non-pressure chronic ulcer of skin of other sites with unspecified severity: Secondary | ICD-10-CM | POA: Diagnosis not present

## 2013-05-24 DIAGNOSIS — R609 Edema, unspecified: Secondary | ICD-10-CM

## 2013-05-25 ENCOUNTER — Ambulatory Visit (HOSPITAL_COMMUNITY)
Admission: RE | Admit: 2013-05-25 | Discharge: 2013-05-25 | Disposition: A | Discharge status: Home / Self Care | Payer: Medicare Other | Source: Ambulatory Visit | Attending: Family Medicine | Admitting: Family Medicine

## 2013-05-25 DIAGNOSIS — M7989 Other specified soft tissue disorders: Secondary | ICD-10-CM | POA: Diagnosis not present

## 2013-05-25 DIAGNOSIS — R609 Edema, unspecified: Secondary | ICD-10-CM

## 2013-05-25 DIAGNOSIS — L97409 Non-pressure chronic ulcer of unspecified heel and midfoot with unspecified severity: Secondary | ICD-10-CM | POA: Diagnosis not present

## 2013-05-25 DIAGNOSIS — C50919 Malignant neoplasm of unspecified site of unspecified female breast: Secondary | ICD-10-CM

## 2013-05-25 NOTE — Progress Notes (Signed)
VASCULAR LAB PRELIMINARY  PRELIMINARY  PRELIMINARY  PRELIMINARY  Right lower extremity venous duplex completed.    Preliminary report:  Right:  No evidence of DVT, superficial thrombosis, or Baker's cyst.  Der Gagliano, RVT 05/25/2013, 10:21 AM

## 2013-05-26 DIAGNOSIS — L98499 Non-pressure chronic ulcer of skin of other sites with unspecified severity: Secondary | ICD-10-CM | POA: Diagnosis not present

## 2013-06-02 DIAGNOSIS — L98499 Non-pressure chronic ulcer of skin of other sites with unspecified severity: Secondary | ICD-10-CM | POA: Diagnosis not present

## 2013-06-09 DIAGNOSIS — L98499 Non-pressure chronic ulcer of skin of other sites with unspecified severity: Secondary | ICD-10-CM | POA: Diagnosis not present

## 2013-06-21 DIAGNOSIS — L97909 Non-pressure chronic ulcer of unspecified part of unspecified lower leg with unspecified severity: Secondary | ICD-10-CM | POA: Diagnosis not present

## 2013-06-28 DIAGNOSIS — L97909 Non-pressure chronic ulcer of unspecified part of unspecified lower leg with unspecified severity: Secondary | ICD-10-CM | POA: Diagnosis not present

## 2013-07-07 ENCOUNTER — Encounter (HOSPITAL_BASED_OUTPATIENT_CLINIC_OR_DEPARTMENT_OTHER): Payer: Medicare Other | Attending: Internal Medicine

## 2013-07-07 ENCOUNTER — Encounter: Payer: Medicare Other | Admitting: *Deleted

## 2013-07-07 DIAGNOSIS — I87339 Chronic venous hypertension (idiopathic) with ulcer and inflammation of unspecified lower extremity: Secondary | ICD-10-CM | POA: Insufficient documentation

## 2013-07-07 DIAGNOSIS — L97909 Non-pressure chronic ulcer of unspecified part of unspecified lower leg with unspecified severity: Principal | ICD-10-CM

## 2013-07-08 DIAGNOSIS — I87339 Chronic venous hypertension (idiopathic) with ulcer and inflammation of unspecified lower extremity: Secondary | ICD-10-CM | POA: Diagnosis not present

## 2013-07-08 DIAGNOSIS — L97909 Non-pressure chronic ulcer of unspecified part of unspecified lower leg with unspecified severity: Secondary | ICD-10-CM | POA: Diagnosis not present

## 2013-07-09 NOTE — Progress Notes (Signed)
[facility]  NAME:  CEDRIC, DENISON                   ACCOUNT NO.:  MEDICAL RECORD NO.:  [id number]      DATE OF BIRTH:  1927-05-08  PHYSICIAN:  Ricard Dillon, M.D. VISIT DATE:  07/08/2013                                  OFFICE VISIT   HISTORY:  [name] is the patient who is referred to [place] through the courtesy of Dr. Arelia Sneddon at [facility].  She has been dealing with a wound on her right medial ankle for the last 6 weeks. She is not specifically aware of how this started.  She does not know whether she might have had minor trauma to the area versus a spontaneous opening.  It would appear that she has venous stasis physiology, but has not had past problems with wound care or wound issues.  She is not a diabetic and has no known vascular issues.  MEDICAL HISTORY:  Osteoarthritis, hypothyroidism, left renal cancer, cancer of the breast, irritable bowel syndrome.  PAST SURGICAL HISTORY:  Includes pacemaker placement in 2010, partial mastectomy bilaterally and a left nephrectomy.  CURRENT MEDICATIONS:  Include calcium 600+ D 1 tablet daily, vitamin D 2000 units daily, Lasix 20 daily, Femara 2.5 daily, Synthroid 50 mcg daily, ASA 81 daily, and ProAmatine 2.5 b.i.d.  PHYSICAL EXAMINATION:  VITAL SIGNS:  Temperature is 97.4, pulse 91, respirations 18, blood pressure 91/55.  Unfortunately, I only noticed this many hours after the patient had already left the clinic.  Her weight is 250 pounds.  She is not a diabetic. VASCULAR ASSESSMENT:  We are unable to obtain ABIs on the right. However, her dorsalis pedis pulse was easily felt.  Capillary refill time was normal. RESPIRATORY:  Clear air entry bilaterally. CARDIAC:  Heart sounds were normal.  There was no murmurs.  No increase in jugular venous pressure.  WOUND EXAM:  The area in question was over the right lateral malleolus. This measured 1.7 x 1.5 x 0.5.  There was roughly 0.5-1 cm  of undermining from 12 o'clock to 8 o'clock in this circular wound.  I did culture this.  There was degree of erythema around the wound that was mild and somewhat tender.  The wound was anesthetized and divided of surface slough with a curette.  She tolerated this well.  IMPRESSIONS:  Probably largely a venous stasis-type wound with venous hypertension, inflammation, and ulceration.  The wound was debrided and cultured.  I started her empirically on doxycycline, with Santyl, moist gauze and ABD pad under an Unna wrap.  We will see her again in a week's time.          ______________________________ Ricard Dillon, M.D.     MGR/MEDQ  D:  07/08/2013  T:  07/09/2013  Job:  [id number]

## 2013-07-14 DIAGNOSIS — I87339 Chronic venous hypertension (idiopathic) with ulcer and inflammation of unspecified lower extremity: Secondary | ICD-10-CM | POA: Diagnosis not present

## 2013-07-15 ENCOUNTER — Encounter: Payer: Self-pay | Admitting: *Deleted

## 2013-07-21 DIAGNOSIS — I87339 Chronic venous hypertension (idiopathic) with ulcer and inflammation of unspecified lower extremity: Secondary | ICD-10-CM | POA: Diagnosis not present

## 2013-07-28 ENCOUNTER — Encounter (HOSPITAL_BASED_OUTPATIENT_CLINIC_OR_DEPARTMENT_OTHER): Payer: Medicare Other | Attending: General Surgery

## 2013-07-28 DIAGNOSIS — L97309 Non-pressure chronic ulcer of unspecified ankle with unspecified severity: Secondary | ICD-10-CM | POA: Insufficient documentation

## 2013-08-04 DIAGNOSIS — L97309 Non-pressure chronic ulcer of unspecified ankle with unspecified severity: Secondary | ICD-10-CM | POA: Diagnosis not present

## 2013-08-11 DIAGNOSIS — L97309 Non-pressure chronic ulcer of unspecified ankle with unspecified severity: Secondary | ICD-10-CM | POA: Diagnosis not present

## 2013-08-18 DIAGNOSIS — L97309 Non-pressure chronic ulcer of unspecified ankle with unspecified severity: Secondary | ICD-10-CM | POA: Diagnosis not present

## 2013-08-25 DIAGNOSIS — L97309 Non-pressure chronic ulcer of unspecified ankle with unspecified severity: Secondary | ICD-10-CM | POA: Diagnosis not present

## 2013-09-01 ENCOUNTER — Encounter (HOSPITAL_BASED_OUTPATIENT_CLINIC_OR_DEPARTMENT_OTHER): Payer: Medicare Other | Attending: General Surgery

## 2013-09-01 DIAGNOSIS — L97309 Non-pressure chronic ulcer of unspecified ankle with unspecified severity: Secondary | ICD-10-CM | POA: Insufficient documentation

## 2013-09-03 DIAGNOSIS — L97309 Non-pressure chronic ulcer of unspecified ankle with unspecified severity: Secondary | ICD-10-CM | POA: Diagnosis not present

## 2013-09-08 DIAGNOSIS — L97309 Non-pressure chronic ulcer of unspecified ankle with unspecified severity: Secondary | ICD-10-CM | POA: Diagnosis not present

## 2013-09-15 DIAGNOSIS — L97309 Non-pressure chronic ulcer of unspecified ankle with unspecified severity: Secondary | ICD-10-CM | POA: Diagnosis not present

## 2013-09-22 DIAGNOSIS — L97309 Non-pressure chronic ulcer of unspecified ankle with unspecified severity: Secondary | ICD-10-CM | POA: Diagnosis not present

## 2013-09-27 ENCOUNTER — Encounter: Payer: Self-pay | Admitting: Cardiology

## 2013-09-29 ENCOUNTER — Encounter (HOSPITAL_BASED_OUTPATIENT_CLINIC_OR_DEPARTMENT_OTHER): Payer: Medicare Other | Attending: General Surgery

## 2013-09-29 ENCOUNTER — Encounter: Payer: Self-pay | Admitting: Cardiology

## 2013-09-29 DIAGNOSIS — L97309 Non-pressure chronic ulcer of unspecified ankle with unspecified severity: Secondary | ICD-10-CM | POA: Insufficient documentation

## 2013-10-05 ENCOUNTER — Telehealth: Payer: Self-pay | Admitting: Internal Medicine

## 2013-10-05 NOTE — Telephone Encounter (Signed)
LMOVM for pt to return call 

## 2013-10-05 NOTE — Telephone Encounter (Signed)
Follow Up:  PT is wanting to know if our office got a transmission from her either today or yesterday

## 2013-10-05 NOTE — Telephone Encounter (Signed)
Follow Up  Pt called. Requests a call back to determine in transmission was received//SR

## 2013-10-06 ENCOUNTER — Other Ambulatory Visit: Payer: Medicare Other

## 2013-10-06 ENCOUNTER — Ambulatory Visit: Payer: Medicare Other | Admitting: Oncology

## 2013-10-06 DIAGNOSIS — L97309 Non-pressure chronic ulcer of unspecified ankle with unspecified severity: Secondary | ICD-10-CM | POA: Diagnosis not present

## 2013-10-06 NOTE — Telephone Encounter (Signed)
LMOVM

## 2013-10-07 NOTE — Telephone Encounter (Signed)
Pt informed that transmission was not received. Pt has appt to come to device clinic 11-08-13 pt aware of appt.

## 2013-10-07 NOTE — Telephone Encounter (Signed)
LMOVM for pt to return call 

## 2013-10-19 ENCOUNTER — Other Ambulatory Visit: Payer: Self-pay | Admitting: *Deleted

## 2013-10-19 MED ORDER — MIDODRINE HCL 2.5 MG PO TABS: 2.5000 mg | ORAL_TABLET | Freq: Two times a day (BID) | ORAL | Status: DC

## 2013-10-20 DIAGNOSIS — L97309 Non-pressure chronic ulcer of unspecified ankle with unspecified severity: Secondary | ICD-10-CM | POA: Diagnosis not present

## 2013-10-23 ENCOUNTER — Other Ambulatory Visit: Payer: Self-pay | Admitting: Oncology

## 2013-10-26 ENCOUNTER — Other Ambulatory Visit: Payer: Self-pay | Admitting: *Deleted

## 2013-10-26 DIAGNOSIS — C50919 Malignant neoplasm of unspecified site of unspecified female breast: Secondary | ICD-10-CM

## 2013-10-26 MED ORDER — ALENDRONATE SODIUM 35 MG PO TABS: 35.0000 mg | ORAL_TABLET | ORAL | Status: DC

## 2013-10-27 ENCOUNTER — Encounter (HOSPITAL_BASED_OUTPATIENT_CLINIC_OR_DEPARTMENT_OTHER): Payer: Medicare Other | Attending: General Surgery

## 2013-10-27 DIAGNOSIS — G5790 Unspecified mononeuropathy of unspecified lower limb: Secondary | ICD-10-CM | POA: Insufficient documentation

## 2013-10-27 DIAGNOSIS — L97309 Non-pressure chronic ulcer of unspecified ankle with unspecified severity: Secondary | ICD-10-CM | POA: Diagnosis not present

## 2013-10-27 DIAGNOSIS — L97809 Non-pressure chronic ulcer of other part of unspecified lower leg with unspecified severity: Secondary | ICD-10-CM | POA: Insufficient documentation

## 2013-10-27 DIAGNOSIS — L97509 Non-pressure chronic ulcer of other part of unspecified foot with unspecified severity: Secondary | ICD-10-CM | POA: Diagnosis not present

## 2013-11-03 DIAGNOSIS — L97809 Non-pressure chronic ulcer of other part of unspecified lower leg with unspecified severity: Secondary | ICD-10-CM | POA: Diagnosis not present

## 2013-11-03 DIAGNOSIS — G5790 Unspecified mononeuropathy of unspecified lower limb: Secondary | ICD-10-CM | POA: Diagnosis not present

## 2013-11-03 DIAGNOSIS — L97509 Non-pressure chronic ulcer of other part of unspecified foot with unspecified severity: Secondary | ICD-10-CM | POA: Diagnosis not present

## 2013-11-03 DIAGNOSIS — L97309 Non-pressure chronic ulcer of unspecified ankle with unspecified severity: Secondary | ICD-10-CM | POA: Diagnosis not present

## 2013-11-08 ENCOUNTER — Encounter: Payer: Self-pay | Admitting: Internal Medicine

## 2013-11-08 ENCOUNTER — Ambulatory Visit (INDEPENDENT_AMBULATORY_CARE_PROVIDER_SITE_OTHER): Payer: Medicare Other | Admitting: Internal Medicine

## 2013-11-08 VITALS — BP 90/70 | HR 78 | Ht 67.5 in | Wt 220.0 lb

## 2013-11-08 DIAGNOSIS — I442 Atrioventricular block, complete: Secondary | ICD-10-CM | POA: Diagnosis not present

## 2013-11-08 DIAGNOSIS — R42 Dizziness and giddiness: Secondary | ICD-10-CM | POA: Diagnosis not present

## 2013-11-08 DIAGNOSIS — Z950 Presence of cardiac pacemaker: Secondary | ICD-10-CM

## 2013-11-08 MED ORDER — MIDODRINE HCL 2.5 MG PO TABS: 2.5000 mg | ORAL_TABLET | Freq: Two times a day (BID) | ORAL | Status: DC

## 2013-11-08 NOTE — Patient Instructions (Signed)
Remote monitoring is used to monitor your pacemaker from home. This monitoring reduces the number of office visits required to check your device to one time per year. It allows [place] to keep an eye on the functioning of your device to ensure it is working properly. You are scheduled for a device check from home on 02-09-2014. You may send your transmission at any time that day. If you have a wireless device, the transmission will be sent automatically. After your physician reviews your transmission, you will receive a postcard with your next transmission date.  Please make sure that the knobs on your Carelink monitor are set on "Tone" and "None" prior to sending your next transmission.  Please send in a test transmission as soon as you can then call [phone] once the transmission is complete and ask for the Device Clinic to make sure your monitor is working.  Your physician recommends that you schedule a follow-up appointment in: 6 months with [facility] and in 12 months with Dr.Allred

## 2013-11-10 DIAGNOSIS — L97509 Non-pressure chronic ulcer of other part of unspecified foot with unspecified severity: Secondary | ICD-10-CM | POA: Diagnosis not present

## 2013-11-10 DIAGNOSIS — L97309 Non-pressure chronic ulcer of unspecified ankle with unspecified severity: Secondary | ICD-10-CM | POA: Diagnosis not present

## 2013-11-10 DIAGNOSIS — G5790 Unspecified mononeuropathy of unspecified lower limb: Secondary | ICD-10-CM | POA: Diagnosis not present

## 2013-11-10 DIAGNOSIS — L97809 Non-pressure chronic ulcer of other part of unspecified lower leg with unspecified severity: Secondary | ICD-10-CM | POA: Diagnosis not present

## 2013-11-10 LAB — MDC_IDC_ENUM_SESS_TYPE_INCLINIC
Battery Impedance: 2583 Ohm
Battery Remaining Longevity: 22 mo
Battery Voltage: 2.72 V
Brady Statistic RV Percent Paced: 100 %
Date Time Interrogation Session: 20150713143930
Lead Channel Impedance Value: 0 Ohm
Lead Channel Pacing Threshold Pulse Width: 0.4 ms
Lead Channel Setting Pacing Pulse Width: 0.4 ms
Lead Channel Setting Sensing Sensitivity: 2.8 mV
MDC IDC MSMT LEADCHNL RV IMPEDANCE VALUE: 413 Ohm
MDC IDC MSMT LEADCHNL RV PACING THRESHOLD AMPLITUDE: 1 V
MDC IDC SET LEADCHNL RV PACING AMPLITUDE: 2.5 V

## 2013-11-11 NOTE — Progress Notes (Signed)
[name], MD: Primary Cardiologist:  Dr [place]  [name] is a 80 y.o. female with a h/o complete heart block sp PPM (MDT) by Dr Blanch Media who presents today for follow-up in the Electrophysiology device clinic. She appears to be doing reasonably well at this time. Today, she  denies symptoms of palpitations, chest pain, shortness of breath, orthopnea, PND, lower extremity edema, dizziness, presyncope, syncope, or neurologic sequela.  The patientis tolerating medications without difficulties and is otherwise without complaint today.   Past Medical History  Diagnosis Date  . 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 Cataract   . Hypercholesterolemia   . Thyroid disease   . Complete heart block     s/p PPM implant (MDT) by Dr Blanch Media.  Atrial lead could not be paced at time of the procedure.  She has chronic AV dysociation  . Orthostatic hypotension     treated with midodrine by Dr Rollene [name]   Past Surgical History  Procedure Laterality Date  . Abdominal hysterectomy    . Cholecystectomy    . Cataract extraction      EYE SURGERY X 2  . Pacemaker insertion      MDT implanted by Dr Blanch Media.  Atrial lead placement was unsuccessful.  She has chronic AV dysociation    History   Social History  . Marital Status: Married    Spouse Name: N/A    Number of Children: N/A  . Years of Education: N/A   Occupational History  . Not on file.   Social History Main Topics  . Smoking status: Never Smoker   . Smokeless tobacco: Not on file  . Alcohol Use: No  . Drug Use: No  . Sexual Activity: Not Currently   Other Topics Concern  . Not on file   Social History Narrative  . No narrative on file    Family History  Problem Relation Age of Onset  . Heart disease Maternal Grandmother     Allergies  Allergen Reactions  . Erythromycin Hives  . Tape     BAND AIDS-SKIN IRRITATION    Current Outpatient Prescriptions  Medication Sig Dispense  Refill  . alendronate (FOSAMAX) 35 MG tablet Take 1 tablet (35 mg total) by mouth every 7 (seven) days. Take with a full glass of water on an empty stomach.  12 tablet  0  . aspirin 81 MG tablet Take 81 mg by mouth daily.        . Calcium Carbonate-Vitamin D (CALCIUM 600+D PO) Take 1 tablet by mouth daily.       . cholecalciferol 2000 UNITS tablet Take 1 capsule daily or as directed  90 tablet  7  . furosemide (LASIX) 20 MG tablet Take 20 mg by mouth daily.        Marland Kitchen letrozole (FEMARA) 2.5 MG tablet TAKE 1 TABLET DAILY  90 tablet  6  . levothyroxine (SYNTHROID, LEVOTHROID) 50 MCG tablet Take 50 mcg by mouth daily.        . midodrine (PROAMATINE) 2.5 MG tablet Take 1 tablet (2.5 mg total) by mouth 2 (two) times daily.  180 tablet  3   No current facility-administered medications for this visit.    ROS- all systems are reviewed and negative except as per HPI  Physical Exam: Filed Vitals:   11/08/13 1115  BP: 90/70  Pulse: 78  Height: 5' 7.5" (1.715 m)  Weight: 220 lb (99.791 kg)    GEN- The  patient is well appearing, alert and oriented x 3 today.   Head- normocephalic, atraumatic Eyes-  Sclera clear, conjunctiva pink Ears- hearing intact Oropharynx- clear Neck- supple  Lungs- Clear to ausculation bilaterally, normal work of breathing Chest- pacemaker pocket is well healed Heart- Regular rate and rhythm (paced) GI- soft, NT, ND, + BS Extremities- no clubbing, cyanosis, or edema Neuro- strength and sensation are intact  Pacemaker interrogation- reviewed in detail today,  See PACEART report  Assessment and Plan:  1. Complete heart block Normal pacemaker function See Pace Art report No changes today She has a VVI device despite sinus rhythm.  I have reviewed Dr Jana Hakim procedure note which indicates that atrial lead placement was not successful.    She is clear that she would not want to have system revision/ atrial lead placement.  We will therefore continue our current pacing  strategy  2. Postural dizziness improved  Carelink every 3 months Follow-up with Dr Donneta Romberg Truitt Merle in 6 months Return to see me in 1 year

## 2013-11-17 DIAGNOSIS — R351 Nocturia: Secondary | ICD-10-CM | POA: Diagnosis not present

## 2013-11-17 DIAGNOSIS — L97809 Non-pressure chronic ulcer of other part of unspecified lower leg with unspecified severity: Secondary | ICD-10-CM | POA: Diagnosis not present

## 2013-11-24 DIAGNOSIS — L97309 Non-pressure chronic ulcer of unspecified ankle with unspecified severity: Secondary | ICD-10-CM | POA: Diagnosis not present

## 2013-11-24 DIAGNOSIS — L97509 Non-pressure chronic ulcer of other part of unspecified foot with unspecified severity: Secondary | ICD-10-CM | POA: Diagnosis not present

## 2013-11-24 DIAGNOSIS — G5790 Unspecified mononeuropathy of unspecified lower limb: Secondary | ICD-10-CM | POA: Diagnosis not present

## 2013-11-24 DIAGNOSIS — L97809 Non-pressure chronic ulcer of other part of unspecified lower leg with unspecified severity: Secondary | ICD-10-CM | POA: Diagnosis not present

## 2013-12-01 ENCOUNTER — Encounter (HOSPITAL_BASED_OUTPATIENT_CLINIC_OR_DEPARTMENT_OTHER): Payer: Medicare Other | Attending: General Surgery

## 2013-12-01 DIAGNOSIS — G5790 Unspecified mononeuropathy of unspecified lower limb: Secondary | ICD-10-CM | POA: Insufficient documentation

## 2013-12-01 DIAGNOSIS — L97309 Non-pressure chronic ulcer of unspecified ankle with unspecified severity: Secondary | ICD-10-CM | POA: Insufficient documentation

## 2013-12-01 DIAGNOSIS — L97509 Non-pressure chronic ulcer of other part of unspecified foot with unspecified severity: Secondary | ICD-10-CM | POA: Diagnosis not present

## 2013-12-03 ENCOUNTER — Encounter: Payer: Self-pay | Admitting: Internal Medicine

## 2013-12-08 DIAGNOSIS — G5790 Unspecified mononeuropathy of unspecified lower limb: Secondary | ICD-10-CM | POA: Diagnosis not present

## 2013-12-08 DIAGNOSIS — L97309 Non-pressure chronic ulcer of unspecified ankle with unspecified severity: Secondary | ICD-10-CM | POA: Diagnosis not present

## 2013-12-08 DIAGNOSIS — L97509 Non-pressure chronic ulcer of other part of unspecified foot with unspecified severity: Secondary | ICD-10-CM | POA: Diagnosis not present

## 2013-12-10 DIAGNOSIS — F411 Generalized anxiety disorder: Secondary | ICD-10-CM | POA: Diagnosis not present

## 2013-12-15 DIAGNOSIS — L97309 Non-pressure chronic ulcer of unspecified ankle with unspecified severity: Secondary | ICD-10-CM | POA: Diagnosis not present

## 2013-12-15 DIAGNOSIS — G5790 Unspecified mononeuropathy of unspecified lower limb: Secondary | ICD-10-CM | POA: Diagnosis not present

## 2013-12-15 DIAGNOSIS — L97509 Non-pressure chronic ulcer of other part of unspecified foot with unspecified severity: Secondary | ICD-10-CM | POA: Diagnosis not present

## 2013-12-16 ENCOUNTER — Other Ambulatory Visit: Payer: Self-pay

## 2013-12-16 DIAGNOSIS — C50919 Malignant neoplasm of unspecified site of unspecified female breast: Secondary | ICD-10-CM

## 2013-12-17 ENCOUNTER — Telehealth: Payer: Self-pay | Admitting: Hematology and Oncology

## 2013-12-17 ENCOUNTER — Ambulatory Visit (HOSPITAL_BASED_OUTPATIENT_CLINIC_OR_DEPARTMENT_OTHER): Payer: Medicare Other | Admitting: Hematology and Oncology

## 2013-12-17 ENCOUNTER — Encounter: Payer: Self-pay | Admitting: Hematology and Oncology

## 2013-12-17 ENCOUNTER — Other Ambulatory Visit (HOSPITAL_BASED_OUTPATIENT_CLINIC_OR_DEPARTMENT_OTHER): Payer: Medicare Other

## 2013-12-17 VITALS — BP 109/86 | HR 89 | Temp 97.8°F | Resp 18 | Ht 67.5 in | Wt 216.0 lb

## 2013-12-17 DIAGNOSIS — Z853 Personal history of malignant neoplasm of breast: Secondary | ICD-10-CM

## 2013-12-17 DIAGNOSIS — C50919 Malignant neoplasm of unspecified site of unspecified female breast: Secondary | ICD-10-CM

## 2013-12-17 DIAGNOSIS — R197 Diarrhea, unspecified: Secondary | ICD-10-CM | POA: Diagnosis not present

## 2013-12-17 DIAGNOSIS — Z79811 Long term (current) use of aromatase inhibitors: Secondary | ICD-10-CM

## 2013-12-17 DIAGNOSIS — M899 Disorder of bone, unspecified: Secondary | ICD-10-CM

## 2013-12-17 DIAGNOSIS — Z85528 Personal history of other malignant neoplasm of kidney: Secondary | ICD-10-CM | POA: Diagnosis not present

## 2013-12-17 DIAGNOSIS — M949 Disorder of cartilage, unspecified: Secondary | ICD-10-CM

## 2013-12-17 LAB — CBC WITH DIFFERENTIAL/PLATELET
BASO%: 1.1 % (ref 0.0–2.0)
Basophils Absolute: 0.1 10*3/uL (ref 0.0–0.1)
EOS%: 5.5 % (ref 0.0–7.0)
Eosinophils Absolute: 0.4 10*3/uL (ref 0.0–0.5)
HCT: 46.3 % (ref 34.8–46.6)
HGB: 15.1 g/dL (ref 11.6–15.9)
LYMPH%: 19.6 % (ref 14.0–49.7)
MCH: 31.2 pg (ref 25.1–34.0)
MCHC: 32.7 g/dL (ref 31.5–36.0)
MCV: 95.3 fL (ref 79.5–101.0)
MONO#: 0.5 10*3/uL (ref 0.1–0.9)
MONO%: 6.5 % (ref 0.0–14.0)
NEUT#: 5 10*3/uL (ref 1.5–6.5)
NEUT%: 67.3 % (ref 38.4–76.8)
Platelets: 297 10*3/uL (ref 145–400)
RBC: 4.86 10*6/uL (ref 3.70–5.45)
RDW: 12.5 % (ref 11.2–14.5)
WBC: 7.5 10*3/uL (ref 3.9–10.3)
lymph#: 1.5 10*3/uL (ref 0.9–3.3)

## 2013-12-17 LAB — COMPREHENSIVE METABOLIC PANEL (CC13)
ALBUMIN: 3.6 g/dL (ref 3.5–5.0)
ALT: 10 U/L (ref 0–55)
AST: 15 U/L (ref 5–34)
Alkaline Phosphatase: 100 U/L (ref 40–150)
Anion Gap: 11 mEq/L (ref 3–11)
BUN: 20 mg/dL (ref 7.0–26.0)
CO2: 27 mEq/L (ref 22–29)
Calcium: 9.9 mg/dL (ref 8.4–10.4)
Chloride: 105 mEq/L (ref 98–109)
Creatinine: 1.2 mg/dL — ABNORMAL HIGH (ref 0.6–1.1)
Glucose: 106 mg/dl (ref 70–140)
POTASSIUM: 3.9 meq/L (ref 3.5–5.1)
Sodium: 143 mEq/L (ref 136–145)
Total Bilirubin: 0.37 mg/dL (ref 0.20–1.20)
Total Protein: 7.6 g/dL (ref 6.4–8.3)

## 2013-12-17 NOTE — Progress Notes (Signed)
Patient Care Team: [name], MD as PCP - General (Internal Medicine)  DIAGNOSIS: Bilateral breast cancers  SUMMARY OF ONCOLOGIC HISTORY:   Breast cancer   04/21/2007 Breast MRI Right breast lower inner quadrant enhancement 8.9 x 4.2 x 4 cm. Biopsy-proven invasive ductal carcinoma Left breast 2 discrete irregular nodules 12 x 8 x 9 mm 11 x 11 x 8 mm portal 3 cm. Biopsy-proven invasive ductal carcinoma   05/05/2007 Surgery Bilateral lumpectomies. Left breast multifocal invasive ductal carcinoma with DCIS 1 sentinel node negative. Right breast multifocal invasive ductal carcinoma 1.4 and 0.7 cm, 3 sentinel lymph nodes -1 showed isolated tumor cells   09/09/2007 Procedure Renal cell cancer status post nephrectomy   09/11/2007 -  Anti-estrogen oral therapy Femara 2.5 mg once daily   03/25/2011 Initial Diagnosis Breast cancer    - 09/10/2007 Radiation Therapy Radiation therapy to both breasts the lumpectomy sites    CHIEF COMPLIANT:  Right leg wound and loose stools INTERVAL HISTORY:  [name] is 80 year old Caucasian lady with the above-mentioned history of bilateral breast cancers who underwent bilateral lumpectomies and radiation sent had been on Femara since 2009. She has been tolerating Femara very well without any major problems. Denies any hot flashes. She has not had mammograms done since 2013 because she felt that she does not need any further mammograms. She is seeing wound care for her right leg. Her major complaint today is that on Sundays when she goes to church she wishes to go to lunch with her friends but because of loose stools she has been stressed and unable to enjoy her Sundays. She wants to know what can be taken to stop the new stools. Apparently she gets loose stools almost every day right after eating for one episode. She is able to manage herself with the help of a walker. She is extremely sad because her husband passed way 4 years ago after being married for 6 years. Her PCP  had prescribed her Lexapro and Ativan. But she has not been taking it.  REVIEW OF SYSTEMS:   Constitutional: Denies fevers, chills or abnormal weight loss Eyes: Denies blurriness of vision Ears, nose, mouth, throat, and face: Denies mucositis or sore throat Respiratory: Fatigue and shortness of breath with exertion Cardiovascular: Denies palpitation, chest discomfort or lower extremity swelling Gastrointestinal:  Complains of loose stools Skin: Denies abnormal skin rashes Lymphatics: Denies new lymphadenopathy or easy bruising Neurological:Denies numbness, tingling or new weaknesses Behavioral/Psych: Mood is stable, no new changes  Breast: Denies any lumps or nodules All other systems were reviewed with the patient and are negative.  I have reviewed the past medical history, past surgical history, social history and family history with the patient and they are unchanged from previous note.  ALLERGIES:  is allergic to erythromycin and tape.  MEDICATIONS:  Current Outpatient Prescriptions  Medication Sig Dispense Refill  . alendronate (FOSAMAX) 35 MG tablet Take 1 tablet (35 mg total) by mouth every 7 (seven) days. Take with a full glass of water on an empty stomach.  12 tablet  0  . aspirin 81 MG tablet Take 81 mg by mouth daily.        . Calcium Carbonate-Vitamin D (CALCIUM 600+D PO) Take 1 tablet by mouth daily.       . cholecalciferol 2000 UNITS tablet Take 1 capsule daily or as directed  90 tablet  7  . furosemide (LASIX) 20 MG tablet Take 20 mg by mouth daily.        Marland Kitchen  letrozole (FEMARA) 2.5 MG tablet TAKE 1 TABLET DAILY  90 tablet  6  . levothyroxine (SYNTHROID, LEVOTHROID) 50 MCG tablet Take 50 mcg by mouth daily.        . midodrine (PROAMATINE) 2.5 MG tablet Take 1 tablet (2.5 mg total) by mouth 2 (two) times daily.  180 tablet  3   No current facility-administered medications for this visit.    PHYSICAL EXAMINATION: ECOG PERFORMANCE STATUS: 2 - Symptomatic, <50% confined  to bed  Filed Vitals:   12/17/13 1014  BP: 109/86  Pulse: 89  Temp: 97.8 F (36.6 C)  Resp: 18   Filed Weights   12/17/13 1014  Weight: 216 lb (97.977 kg)    GENERAL:alert, no distress and comfortable SKIN: skin color, texture, turgor are normal, no rashes or significant lesions EYES: normal, Conjunctiva are pink and non-injected, sclera clear OROPHARYNX:no exudate, no erythema and lips, buccal mucosa, and tongue normal  NECK: supple, thyroid normal size, non-tender, without nodularity LYMPH:  no palpable lymphadenopathy in the cervical, axillary or inguinal LUNGS: clear to auscultation and percussion with normal breathing effort HEART: regular rate & rhythm and no murmurs and no lower extremity edema ABDOMEN:abdomen soft, non-tender and normal bowel sounds Musculoskeletal:no cyanosis of digits and no clubbing  NEURO: alert & oriented x 3 with fluent speech, no focal motor/sensory deficits  LABORATORY DATA:  I have reviewed the data as listed Appointment on 12/17/2013  Component Date Value Ref Range Status  . Sodium 12/17/2013 143  136 - 145 mEq/L Final  . Potassium 12/17/2013 3.9  3.5 - 5.1 mEq/L Final  . Chloride 12/17/2013 105  98 - 109 mEq/L Final  . CO2 12/17/2013 27  22 - 29 mEq/L Final  . Glucose 12/17/2013 106  70 - 140 mg/dl Final  . BUN 12/17/2013 20.0  7.0 - 26.0 mg/dL Final  . Creatinine 12/17/2013 1.2* 0.6 - 1.1 mg/dL Final  . Total Bilirubin 12/17/2013 0.37  0.20 - 1.20 mg/dL Final  . Alkaline Phosphatase 12/17/2013 100  40 - 150 U/L Final  . AST 12/17/2013 15  5 - 34 U/L Final  . ALT 12/17/2013 10  0 - 55 U/L Final  . Total Protein 12/17/2013 7.6  6.4 - 8.3 g/dL Final  . Albumin 12/17/2013 3.6  3.5 - 5.0 g/dL Final  . Calcium 12/17/2013 9.9  8.4 - 10.4 mg/dL Final  . Anion Gap 12/17/2013 11  3 - 11 mEq/L Final  . WBC 12/17/2013 7.5  3.9 - 10.3 10e3/uL Final  . NEUT# 12/17/2013 5.0  1.5 - 6.5 10e3/uL Final  . HGB 12/17/2013 15.1  11.6 - 15.9 g/dL Final   . HCT 12/17/2013 46.3  34.8 - 46.6 % Final  . Platelets 12/17/2013 297  145 - 400 10e3/uL Final  . MCV 12/17/2013 95.3  79.5 - 101.0 fL Final  . MCH 12/17/2013 31.2  25.1 - 34.0 pg Final  . MCHC 12/17/2013 32.7  31.5 - 36.0 g/dL Final  . RBC 12/17/2013 4.86  3.70 - 5.45 10e6/uL Final  . RDW 12/17/2013 12.5  11.2 - 14.5 % Final  . lymph# 12/17/2013 1.5  0.9 - 3.3 10e3/uL Final  . MONO# 12/17/2013 0.5  0.1 - 0.9 10e3/uL Final  . Eosinophils Absolute 12/17/2013 0.4  0.0 - 0.5 10e3/uL Final  . Basophils Absolute 12/17/2013 0.1  0.0 - 0.1 10e3/uL Final  . NEUT% 12/17/2013 67.3  38.4 - 76.8 % Final  . LYMPH% 12/17/2013 19.6  14.0 - 49.7 % Final  .  MONO% 12/17/2013 6.5  0.0 - 14.0 % Final  . EOS% 12/17/2013 5.5  0.0 - 7.0 % Final  . BASO% 12/17/2013 1.1  0.0 - 2.0 % Final    RADIOGRAPHIC STUDIES: I have personally reviewed the radiology reports and agreed with their findings. No results found.   DISEASE STAGE: No matching staging information was found for the patient. ASSESSMENT & PLAN:  Breast cancer Bilateral breast cancers status post bilateral lumpectomies followed by bilateral radiation treatments to the breast and currently on Femara 2.5 mg by mouth daily since 2009. Patient has been tolerating Femara extremely well without any major problems. She said today walking with a walker. She has not had a mammogram since 2013 but she would like to start back on mammograms again. We discussed whether discontinuation of Femara is an option but the patient was reluctant to stop it and wanted to continue it for the time being.  Diarrhea: Patient's main concern is loose stools on Sundays when she goes to church. I instructed her to take one Imodium in the morning to see if it would stop the diarrhea. If one does not work she can take 2.  Surveillance: We'll schedule mammograms in December and followup after that to do a thorough physical exam. Patient will need a bone density test in December  because previously she has osteopenia T score of -2.2 in 2013.  Renal cell cancer: Status post nephrectomy no clinical evidence of any disease relapse.   Orders Placed This Encounter  Procedures  . MM Digital Diagnostic Bilat    Standing Status: Future     Number of Occurrences:      Standing Expiration Date: 12/17/2014    Order Specific Question:  Reason for Exam (SYMPTOM  OR DIAGNOSIS REQUIRED)    Answer:  Annual mammograms with H/O breast cancer    Order Specific Question:  Preferred imaging location?    Answer:  [facility]   The patient has a good understanding of the overall plan. she agrees with it. She will call with any problems that may develop before her next visit here.  I spent 30 minutes counseling the patient face to face. The total time spent in the appointment was 40 minutes and more than 50% was on counseling and review of test results    Rulon Eisenmenger, MD 12/17/2013 10:58 AM

## 2013-12-17 NOTE — Telephone Encounter (Signed)
, °

## 2013-12-17 NOTE — Assessment & Plan Note (Addendum)
Bilateral breast cancers status post bilateral lumpectomies followed by bilateral radiation treatments to the breast and currently on Femara 2.5 mg by mouth daily since 2009. Patient has been tolerating Femara extremely well without any major problems. She said today walking with a walker. She has not had a mammogram since 2013 but she would like to start back on mammograms again. We discussed whether discontinuation of Femara is an option but the patient was reluctant to stop it and wanted to continue it for the time being.  Diarrhea: Patient's main concern is loose stools on Sundays when she goes to church. I instructed her to take one Imodium in the morning to see if it would stop the diarrhea. If one does not work she can take 2.  Surveillance: We'll schedule mammograms in December and followup after that to do a thorough physical exam. Patient will need a bone density test in December because previously she has osteopenia T score of -2.2 in 2013.  Renal cell cancer: Status post nephrectomy no clinical evidence of any disease relapse.

## 2013-12-22 ENCOUNTER — Other Ambulatory Visit: Payer: Self-pay

## 2013-12-22 DIAGNOSIS — L97509 Non-pressure chronic ulcer of other part of unspecified foot with unspecified severity: Secondary | ICD-10-CM | POA: Diagnosis not present

## 2013-12-22 DIAGNOSIS — G5790 Unspecified mononeuropathy of unspecified lower limb: Secondary | ICD-10-CM | POA: Diagnosis not present

## 2013-12-22 DIAGNOSIS — L97309 Non-pressure chronic ulcer of unspecified ankle with unspecified severity: Secondary | ICD-10-CM | POA: Diagnosis not present

## 2013-12-22 DIAGNOSIS — M8580 Other specified disorders of bone density and structure, unspecified site: Secondary | ICD-10-CM

## 2013-12-22 DIAGNOSIS — C50919 Malignant neoplasm of unspecified site of unspecified female breast: Secondary | ICD-10-CM

## 2013-12-29 ENCOUNTER — Encounter (HOSPITAL_BASED_OUTPATIENT_CLINIC_OR_DEPARTMENT_OTHER): Payer: Medicare Other | Attending: General Surgery

## 2013-12-29 DIAGNOSIS — L97309 Non-pressure chronic ulcer of unspecified ankle with unspecified severity: Secondary | ICD-10-CM | POA: Insufficient documentation

## 2013-12-29 DIAGNOSIS — I872 Venous insufficiency (chronic) (peripheral): Secondary | ICD-10-CM | POA: Diagnosis not present

## 2014-01-05 DIAGNOSIS — Z23 Encounter for immunization: Secondary | ICD-10-CM | POA: Diagnosis not present

## 2014-01-05 DIAGNOSIS — L98499 Non-pressure chronic ulcer of skin of other sites with unspecified severity: Secondary | ICD-10-CM | POA: Diagnosis not present

## 2014-01-12 ENCOUNTER — Telehealth: Payer: Self-pay | Admitting: Vascular Surgery

## 2014-01-12 DIAGNOSIS — R197 Diarrhea, unspecified: Secondary | ICD-10-CM | POA: Diagnosis not present

## 2014-01-12 NOTE — Telephone Encounter (Signed)
Message copied by Rufina Falco on Wed Jan 12, 2014  8:53 AM ------      Message from: Gena Fray      Created: Fri Jan 07, 2014 10:04 AM      Regarding: Phone Call       Rip Harbour,            You had a message on 01/07/14 from Cayleen Benjamin stating that she did not need an appointment at this time. She has been back to her doctor and he seems to think he can handle the issue. She wanted to cancel the referral.            Thanks,      Hinton Dyer ------

## 2014-01-14 ENCOUNTER — Other Ambulatory Visit: Payer: Self-pay | Admitting: Adult Health

## 2014-01-14 DIAGNOSIS — C50919 Malignant neoplasm of unspecified site of unspecified female breast: Secondary | ICD-10-CM

## 2014-01-19 DIAGNOSIS — L97909 Non-pressure chronic ulcer of unspecified part of unspecified lower leg with unspecified severity: Secondary | ICD-10-CM | POA: Diagnosis not present

## 2014-01-26 DIAGNOSIS — L0291 Cutaneous abscess, unspecified: Secondary | ICD-10-CM | POA: Diagnosis not present

## 2014-01-26 DIAGNOSIS — L0390 Cellulitis, unspecified: Secondary | ICD-10-CM | POA: Diagnosis not present

## 2014-02-02 DIAGNOSIS — L03115 Cellulitis of right lower limb: Secondary | ICD-10-CM | POA: Diagnosis not present

## 2014-02-09 ENCOUNTER — Encounter: Payer: Medicare Other | Admitting: *Deleted

## 2014-02-09 ENCOUNTER — Telehealth: Payer: Self-pay | Admitting: Cardiology

## 2014-02-09 NOTE — Telephone Encounter (Signed)
Called pt to remind her of remote transmission that is due from home today. Pt stated that she sent transmission at 9:00 AM but we did not receive transmission. Pt requested to come in office to have device checked. Pt agreed to appt on 05-02-14 at 9:30 AM

## 2014-02-16 DIAGNOSIS — I83013 Varicose veins of right lower extremity with ulcer of ankle: Secondary | ICD-10-CM | POA: Diagnosis not present

## 2014-02-16 DIAGNOSIS — L03115 Cellulitis of right lower limb: Secondary | ICD-10-CM | POA: Diagnosis not present

## 2014-03-16 DIAGNOSIS — I83013 Varicose veins of right lower extremity with ulcer of ankle: Secondary | ICD-10-CM | POA: Diagnosis not present

## 2014-04-08 ENCOUNTER — Encounter: Payer: Self-pay | Admitting: Cardiology

## 2014-04-08 ENCOUNTER — Ambulatory Visit (INDEPENDENT_AMBULATORY_CARE_PROVIDER_SITE_OTHER): Payer: Medicare Other | Admitting: Cardiology

## 2014-04-08 VITALS — BP 100/72 | HR 76 | Ht 67.5 in | Wt 220.6 lb

## 2014-04-08 DIAGNOSIS — I442 Atrioventricular block, complete: Secondary | ICD-10-CM | POA: Diagnosis not present

## 2014-04-08 DIAGNOSIS — R42 Dizziness and giddiness: Secondary | ICD-10-CM | POA: Diagnosis not present

## 2014-04-08 DIAGNOSIS — Z950 Presence of cardiac pacemaker: Secondary | ICD-10-CM

## 2014-04-08 NOTE — Patient Instructions (Signed)
Continue your current therapy  I will see you again in one year.   

## 2014-04-08 NOTE — Progress Notes (Signed)
[name] Date of Birth: 1927-12-08 Medical Record #[id number]  History of Present Illness: [name] is seen today for follow up of complete heart block.  She has a history of complete heart block with 12 second pause and underwent permanent pacemaker implant on 10/09/2007 with a Medtronic adapta VVI pacemaker. She denies any other cardiac problems. She also has a history of orthostatic dizziness managed with midodrine. She has no history of coronary disease or congestive heart failure. Her last pacemaker check in July was satisfactory. 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 Maudie Mercury.  Current Outpatient Prescriptions on File Prior to Visit  Medication Sig Dispense Refill  . alendronate (FOSAMAX) 35 MG tablet TAKE 1 TABLET EVERY SEVEN DAYS WITH A FULL GLASS OF WATER ON AN EMPTY STOMACH 12 tablet 1  . aspirin 81 MG tablet Take 81 mg by mouth daily.      . Calcium Carbonate-Vitamin D (CALCIUM 600+D PO) Take 1 tablet by mouth daily.     . cholecalciferol 2000 UNITS tablet Take 1 capsule daily or as directed 90 tablet 7  . furosemide (LASIX) 20 MG tablet Take 20 mg by mouth daily.      Marland Kitchen letrozole (FEMARA) 2.5 MG tablet TAKE 1 TABLET DAILY 90 tablet 6  . levothyroxine (SYNTHROID, LEVOTHROID) 50 MCG tablet Take 50 mcg by mouth daily.      . midodrine (PROAMATINE) 2.5 MG tablet Take 1 tablet (2.5 mg total) by mouth 2 (two) times daily. 180 tablet 3   No current facility-administered medications on file prior to visit.    Allergies  Allergen Reactions  . Erythromycin Hives  . Tape     BAND AIDS-SKIN IRRITATION    Past Medical History  Diagnosis Date  . 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 Cataract   . Hypercholesterolemia   . Thyroid disease   . Complete heart block     s/p PPM implant (MDT) by Dr Blanch Media.  Atrial lead could not be paced at time of the procedure.  She has chronic AV dysociation  .  Orthostatic hypotension     treated with midodrine by Dr Rollene [name]    Past Surgical History  Procedure Laterality Date  . Abdominal hysterectomy    . Cholecystectomy    . Cataract extraction      EYE SURGERY X 2  . Pacemaker insertion      MDT implanted by Dr Blanch Media.  Atrial lead placement was unsuccessful.  She has chronic AV dysociation    History  Smoking status  . Never Smoker   Smokeless tobacco  . Not on file    History  Alcohol Use No    Family History  Problem Relation Age of Onset  . Heart disease Maternal Grandmother     Review of Systems: As noted in history of present illness.  All other systems were reviewed and are negative.  Physical Exam: BP 100/72 mmHg  Pulse 76  Ht 5' 7.5" (1.715 m)  Wt 220 lb 9.6 oz (100.064 kg)  BMI 34.02 kg/m2 She is a morbidly obese, elderly white female in no acute distress. HEENT: Normal Lungs: Clear Cardiovascular: Regular rate and rhythm without gallop, murmur, or click. Her pacemaker site in the left subclavian area is normal. Abdomen: Obese, soft, nontender. No masses or hepatosplenomegaly. Extremities: No cyanosis or edema. Pulses are 2+ and symmetric. Skin: Warm and dry Neuro: Alert and oriented x3. Cranial  nerves II through XII are intact.  LABORATORY DATA: ECG today shows ventricular paced rhythm, rate 70. I have personally reviewed and interpreted this study.   . Assessment / Plan: 1. History complete heart block status post permanent VVIR pacemaker placement in June of 2009. Clinically doing well. She is followed by Dr. Rayann Heman in the pacer clinic. I'll followup again in one year.  2. History of orthostatic hypotension-on midodrine.  3. Hyperlipidemia on statin therapy.

## 2014-04-12 ENCOUNTER — Emergency Department (HOSPITAL_COMMUNITY): Payer: Medicare Other

## 2014-04-12 ENCOUNTER — Telehealth: Payer: Self-pay

## 2014-04-12 ENCOUNTER — Encounter (HOSPITAL_COMMUNITY): Payer: Self-pay

## 2014-04-12 ENCOUNTER — Emergency Department (HOSPITAL_COMMUNITY)
Admission: EM | Admit: 2014-04-12 | Discharge: 2014-04-12 | Disposition: A | Discharge status: Home / Self Care | Payer: Medicare Other | Attending: Emergency Medicine | Admitting: Emergency Medicine

## 2014-04-12 DIAGNOSIS — E780 Pure hypercholesterolemia: Secondary | ICD-10-CM | POA: Diagnosis not present

## 2014-04-12 DIAGNOSIS — R112 Nausea with vomiting, unspecified: Secondary | ICD-10-CM | POA: Diagnosis not present

## 2014-04-12 DIAGNOSIS — Z792 Long term (current) use of antibiotics: Secondary | ICD-10-CM | POA: Diagnosis not present

## 2014-04-12 DIAGNOSIS — R1084 Generalized abdominal pain: Secondary | ICD-10-CM | POA: Diagnosis not present

## 2014-04-12 DIAGNOSIS — Z79899 Other long term (current) drug therapy: Secondary | ICD-10-CM | POA: Insufficient documentation

## 2014-04-12 DIAGNOSIS — Z7982 Long term (current) use of aspirin: Secondary | ICD-10-CM | POA: Diagnosis not present

## 2014-04-12 DIAGNOSIS — E079 Disorder of thyroid, unspecified: Secondary | ICD-10-CM | POA: Insufficient documentation

## 2014-04-12 DIAGNOSIS — Z8679 Personal history of other diseases of the circulatory system: Secondary | ICD-10-CM | POA: Diagnosis not present

## 2014-04-12 DIAGNOSIS — Z853 Personal history of malignant neoplasm of breast: Secondary | ICD-10-CM | POA: Diagnosis not present

## 2014-04-12 DIAGNOSIS — Z8669 Personal history of other diseases of the nervous system and sense organs: Secondary | ICD-10-CM | POA: Diagnosis not present

## 2014-04-12 DIAGNOSIS — R197 Diarrhea, unspecified: Secondary | ICD-10-CM | POA: Diagnosis not present

## 2014-04-12 DIAGNOSIS — R109 Unspecified abdominal pain: Secondary | ICD-10-CM | POA: Diagnosis not present

## 2014-04-12 DIAGNOSIS — K2970 Gastritis, unspecified, without bleeding: Secondary | ICD-10-CM | POA: Diagnosis not present

## 2014-04-12 DIAGNOSIS — R1013 Epigastric pain: Secondary | ICD-10-CM | POA: Insufficient documentation

## 2014-04-12 DIAGNOSIS — Z7952 Long term (current) use of systemic steroids: Secondary | ICD-10-CM | POA: Diagnosis not present

## 2014-04-12 LAB — URINALYSIS, ROUTINE W REFLEX MICROSCOPIC
Glucose, UA: NEGATIVE mg/dL
Ketones, ur: NEGATIVE mg/dL
NITRITE: NEGATIVE
Protein, ur: NEGATIVE mg/dL
SPECIFIC GRAVITY, URINE: 1.026 (ref 1.005–1.030)
Urobilinogen, UA: 0.2 mg/dL (ref 0.0–1.0)
pH: 5 (ref 5.0–8.0)

## 2014-04-12 LAB — CBC WITH DIFFERENTIAL/PLATELET
Basophils Absolute: 0 10*3/uL (ref 0.0–0.1)
Basophils Relative: 0 % (ref 0–1)
EOS ABS: 0.3 10*3/uL (ref 0.0–0.7)
Eosinophils Relative: 2 % (ref 0–5)
HCT: 51.8 % — ABNORMAL HIGH (ref 36.0–46.0)
HEMOGLOBIN: 17.2 g/dL — AB (ref 12.0–15.0)
Lymphocytes Relative: 7 % — ABNORMAL LOW (ref 12–46)
Lymphs Abs: 1 10*3/uL (ref 0.7–4.0)
MCH: 31.9 pg (ref 26.0–34.0)
MCHC: 33.2 g/dL (ref 30.0–36.0)
MCV: 96.1 fL (ref 78.0–100.0)
MONOS PCT: 8 % (ref 3–12)
Monocytes Absolute: 1.1 10*3/uL — ABNORMAL HIGH (ref 0.1–1.0)
NEUTROS ABS: 11.6 10*3/uL — AB (ref 1.7–7.7)
NEUTROS PCT: 83 % — AB (ref 43–77)
Platelets: 251 10*3/uL (ref 150–400)
RBC: 5.39 MIL/uL — ABNORMAL HIGH (ref 3.87–5.11)
RDW: 13 % (ref 11.5–15.5)
WBC: 14 10*3/uL — ABNORMAL HIGH (ref 4.0–10.5)

## 2014-04-12 LAB — COMPREHENSIVE METABOLIC PANEL
ALBUMIN: 4.4 g/dL (ref 3.5–5.2)
ALK PHOS: 98 U/L (ref 39–117)
ALT: 14 U/L (ref 0–35)
AST: 21 U/L (ref 0–37)
Anion gap: 22 — ABNORMAL HIGH (ref 5–15)
BILIRUBIN TOTAL: 0.4 mg/dL (ref 0.3–1.2)
BUN: 28 mg/dL — ABNORMAL HIGH (ref 6–23)
CHLORIDE: 99 meq/L (ref 96–112)
CO2: 22 mEq/L (ref 19–32)
Calcium: 10.7 mg/dL — ABNORMAL HIGH (ref 8.4–10.5)
Creatinine, Ser: 1.1 mg/dL (ref 0.50–1.10)
GFR calc Af Amer: 52 mL/min — ABNORMAL LOW (ref >90)
GFR calc non Af Amer: 45 mL/min — ABNORMAL LOW (ref >90)
Glucose, Bld: 106 mg/dL — ABNORMAL HIGH (ref 70–99)
POTASSIUM: 4.4 meq/L (ref 3.7–5.3)
Sodium: 143 mEq/L (ref 137–147)
Total Protein: 8.3 g/dL (ref 6.0–8.3)

## 2014-04-12 LAB — URINE MICROSCOPIC-ADD ON

## 2014-04-12 LAB — PRO B NATRIURETIC PEPTIDE: Pro B Natriuretic peptide (BNP): 660.2 pg/mL — ABNORMAL HIGH (ref 0–450)

## 2014-04-12 LAB — TROPONIN I

## 2014-04-12 LAB — LIPASE, BLOOD: Lipase: 102 U/L — ABNORMAL HIGH (ref 11–59)

## 2014-04-12 MED ORDER — SODIUM CHLORIDE 0.9 % IV BOLUS (SEPSIS)
1000.0000 mL | Freq: Once | INTRAVENOUS | Status: AC
  Administered 2014-04-12: 1000 mL via INTRAVENOUS

## 2014-04-12 MED ORDER — ONDANSETRON 4 MG PO TBDP: ORAL_TABLET | ORAL | Status: DC

## 2014-04-12 MED ORDER — IOHEXOL 300 MG/ML  SOLN
25.0000 mL | Freq: Once | INTRAMUSCULAR | Status: AC | PRN
  Administered 2014-04-12: 25 mL via ORAL

## 2014-04-12 MED ORDER — ONDANSETRON 8 MG/NS 50 ML IVPB
8.0000 mg | Freq: Once | INTRAVENOUS | Status: AC
Start: 2014-04-12 — End: 2014-04-12
  Administered 2014-04-12: 8 mg via INTRAVENOUS
  Filled 2014-04-12 (×3): qty 8

## 2014-04-12 MED ORDER — IOHEXOL 300 MG/ML  SOLN
100.0000 mL | Freq: Once | INTRAMUSCULAR | Status: AC | PRN
  Administered 2014-04-12: 100 mL via INTRAVENOUS

## 2014-04-12 NOTE — ED Notes (Signed)
Dr. Dayna Barker at the bedside.

## 2014-04-12 NOTE — ED Notes (Signed)
Case management at bedside.

## 2014-04-12 NOTE — ED Notes (Signed)
Pt pooped in bedpan also, cannot use the urine; Assisted Brooke, RN with in and out cath without success

## 2014-04-12 NOTE — ED Notes (Signed)
Pt placed on a bedpan.  

## 2014-04-12 NOTE — ED Notes (Signed)
Zofran IVPB stopped at this time.

## 2014-04-12 NOTE — ED Notes (Signed)
Attempted in and out cath with Idalia Needle, NT. Pt uncooperative and grabbing RN 's hands away. Will have patient attempt to void again at a later time.

## 2014-04-12 NOTE — ED Notes (Signed)
Patient reports her ride has already been called. Attempted to call neighbor, per patient: [name] at [phone] with no answer.

## 2014-04-12 NOTE — ED Notes (Signed)
[facility]- Pt coming from home is being treated for wound on the right ankle. Pt taking Augmentin at home. Took first dose this morning with breakfast and now has n/v/d. Pt is tearful on arrival, recently suffered loss of husband in May.

## 2014-04-12 NOTE — ED Notes (Signed)
Pt vomiting with EMS still at the bedside.

## 2014-04-12 NOTE — ED Notes (Signed)
Clarified, Dr. Dayna Barker allows bolus to infuse until the patient's ride arrives.

## 2014-04-12 NOTE — ED Notes (Signed)
Darryll Capers [phone]

## 2014-04-12 NOTE — ED Provider Notes (Signed)
Patient presented to the ER with nausea, vomiting, diarrhea. Symptoms began today after taking Augmentin. Patient reports a chronic wound on her right ankle. Her doctor initiated Augmentin to treat possible infection. Patient comes in by ambulance. She has been medicated and is feeling better. She never had any chest pain or abdominal pain.  Face to face Exam: HEENT - PERRLA Lungs - CTAB Heart - RRR, no M/R/G Abd - S/NT/ND Neuro - alert, oriented x3 Skin - 0.75 cm ulcerated area lateral aspect of right ankle  Plan: Basic labs, gentle hydration. Suspect symptoms are secondary to the Augmentin. I do not see any overt signs of infection at this time. Discontinue Augmentin, consider topical treatment and visiting the wound care center.   Date: 04/12/2014  Rate: 70  Rhythm: ventricular paced rhythm  no further analysis    Orpah Greek, MD 04/12/14 [phone]

## 2014-04-12 NOTE — Telephone Encounter (Signed)
Pt called to reschedule appt 12/18.  POF sent.

## 2014-04-12 NOTE — Care Management (Addendum)
   CARE MANAGEMENT ED NOTE 04/12/2014  Patient:  [name], [name]   Account Number:  [id number]  Date Initiated:  04/12/2014  Documentation initiated by:  [facility]  Subjective/Objective Assessment:     Subjective/Objective Assessment Detail:   Patient presented to [facility] ED with vomiting and diarrhea today after a couple days of malaise, difficulty getting around.     Action/Plan:   Discharge with [place]   Action/Plan Detail:   Anticipated DC Date:  04/12/2014     Status Recommendation to Physician:   Result of Recommendation:  Agreed  Other ED Services  Consult Working Plan     [place]   Choice offered to / List presented to:  C-1 Patient     [place] arranged  HH-1 RN  [place].    Status of service:  Completed, signed off  ED Comments:   ED Comments Detail:  04/12/2014 20:48 PM Wendi Maya RN BSN NCM [phone] No acute finding on ED evaluation. [facility] referral faxed in to [facility] 336 [phone] received fax confirmation. [facility] brochure given with contact information explained 24-48 [facility] will contact patient to arrange initial visit. My contacted information was also provided if patient should have any further concerns or questions. Patient verbalized understanding and is agreeable with disposition plan. Updated Dr. Betsey Holiday and Alverda Skeans RN with disposition plan. Patient will be transported by friend in  private vehicle. No further CM needs identified   04/12/2014 05:43 PM W. Stann Mainland RN BSN NCM [phone]   ED CM consulted concerning [facility] services. Patient presented [facility] ED via EMS with vomiting and diarrhea, EMS had concerns about her medications not being taken correctly. Patient lives alone  recently widowed after 68 years but, she has a family member that checks on her daily.  Patient statesPatient been c/o increasing weakness since last week. Reviewed record, patient has Medicare/BCBS supplement. Met with  patient discussed recommendation for [facility] services, patient agreeable with disposition plan RN for medication management and PT for reconditioning. Patient reports having 2 walkers at home, declines any other DME. Offered choice, list presented patient elected [facility]. ED evaluation still in progress.

## 2014-04-12 NOTE — ED Notes (Signed)
Called neighbor for transportation back to home- [place]- [phone]

## 2014-04-12 NOTE — ED Notes (Signed)
CT aware pt finished contrast 

## 2014-04-12 NOTE — ED Provider Notes (Signed)
CSN: [id number]     Arrival date & time 04/12/14  1453 History   First MD Initiated Contact with Patient 04/12/14 1457     Chief Complaint  Patient presents with  . Emesis  . Diarrhea     (Consider location/radiation/quality/duration/timing/severity/associated sxs/prior Treatment) Patient is a 80 y.o. female presenting with vomiting.  Emesis Severity:  Mild Duration:  1 day Number of daily episodes:  2 Able to tolerate:  Liquids Chronicity:  New Relieved by:  None tried Worsened by:  Nothing tried Ineffective treatments:  None tried Associated symptoms: abdominal pain and diarrhea   Associated symptoms: no fever, no headaches and no sore throat   Risk factors: no alcohol use     Past Medical History  Diagnosis Date  . 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 Cataract   . Hypercholesterolemia   . Thyroid disease   . Complete heart block     s/p PPM implant (MDT) by Dr Blanch Media.  Atrial lead could not be paced at time of the procedure.  She has chronic AV dysociation  . Orthostatic hypotension     treated with midodrine by Dr Rollene [name]   Past Surgical History  Procedure Laterality Date  . Abdominal hysterectomy    . Cholecystectomy    . Cataract extraction      EYE SURGERY X 2  . Pacemaker insertion      MDT implanted by Dr Blanch Media.  Atrial lead placement was unsuccessful.  She has chronic AV dysociation   Family History  Problem Relation Age of Onset  . Heart disease Maternal Grandmother    History  Substance Use Topics  . Smoking status: Never Smoker   . Smokeless tobacco: Not on file  . Alcohol Use: No   OB History    No data available     Review of Systems  Constitutional: Negative for fever.  HENT: Negative for sore throat.   Respiratory: Negative for chest tightness and shortness of breath.   Gastrointestinal: Positive for vomiting, abdominal pain and diarrhea. Negative for constipation.  Endocrine: Negative for  polydipsia and polyuria.  Neurological: Negative for headaches.  All other systems reviewed and are negative.     Allergies  Erythromycin and Tape  Home Medications   Prior to Admission medications   Medication Sig Start Date End Date Taking? Authorizing Provider  alendronate (FOSAMAX) 35 MG tablet TAKE 1 TABLET EVERY SEVEN DAYS WITH A FULL GLASS OF WATER ON AN EMPTY STOMACH 01/14/14  Yes Rulon Eisenmenger, MD  alendronate (FOSAMAX) 35 MG tablet Take 35 mg by mouth every 7 (seven) days. Take with a full glass of water on an empty stomach. Take on Mondays   Yes Historical Provider, MD  amoxicillin-clavulanate (AUGMENTIN) 875-125 MG per tablet Take 1 tablet by mouth 2 (two) times daily. Started on 04-11-14. Take for wound on leg   Yes Historical Provider, MD  aspirin 81 MG tablet Take 81 mg by mouth daily.     Yes Historical Provider, MD  Calcium Carbonate-Vitamin D (CALCIUM 600+D PO) Take 1 tablet by mouth daily.    Yes Historical Provider, MD  cholecalciferol 2000 UNITS tablet Take 1 capsule daily or as directed 09/14/12  Yes Deatra Robinson, MD  furosemide (LASIX) 20 MG tablet Take 20 mg by mouth daily.     Yes Historical Provider, MD  HIBICLENS 4 % external liquid Apply 1 application topically daily as needed. Wash leg wound once daily 02/02/14  Yes  Historical Provider, MD  letrozole (FEMARA) 2.5 MG tablet TAKE 1 TABLET DAILY   Yes Minette Headland, NP  levothyroxine (SYNTHROID, LEVOTHROID) 50 MCG tablet Take 50 mcg by mouth daily.     Yes Historical Provider, MD  midodrine (PROAMATINE) 2.5 MG tablet Take 1 tablet (2.5 mg total) by mouth 2 (two) times daily. 11/08/13  Yes Coralyn Mark, MD  sulfamethoxazole-trimethoprim (BACTRIM DS) 800-160 MG per tablet Take 1 tablet by mouth daily. Apply to leg wound daily. Take one everyday 01/20/14  Yes Historical Provider, MD  triamcinolone cream (KENALOG) 0.5 % Apply to affected area twice daily externally 14 days 02/16/14  Yes Historical Provider, MD   [facility] 625 MG tablet Take 0.5 tablets by mouth daily. 01/13/14  Yes Historical Provider, MD  ondansetron (ZOFRAN ODT) 4 MG disintegrating tablet 4mg  ODT q4 hours prn nausea/vomit 04/12/14   Merrily Pew, MD   BP 111/60 mmHg  Pulse 71  Temp(Src) 98.1 F (36.7 C) (Oral)  Resp 18  SpO2 99% Physical Exam  Constitutional: She is oriented to person, place, and time. She appears well-developed and well-nourished.  HENT:  Head: Normocephalic and atraumatic.  Eyes: Conjunctivae and EOM are normal. Right eye exhibits no discharge. Left eye exhibits no discharge.  Cardiovascular: Normal rate and regular rhythm.   Pulmonary/Chest: Effort normal and breath sounds normal. No respiratory distress.  Abdominal: Soft. She exhibits no distension. There is no tenderness. There is no rebound.  Musculoskeletal: Normal range of motion. She exhibits no edema or tenderness.  Neurological: She is alert and oriented to person, place, and time.  Skin: Skin is warm and dry.  Nursing note and vitals reviewed.   ED Course  Procedures (including critical care time) Labs Review Labs Reviewed  CBC WITH DIFFERENTIAL - Abnormal; Notable for the following:    WBC 14.0 (*)    RBC 5.39 (*)    Hemoglobin 17.2 (*)    HCT 51.8 (*)    Neutrophils Relative % 83 (*)    Neutro Abs 11.6 (*)    Lymphocytes Relative 7 (*)    Monocytes Absolute 1.1 (*)    All other components within normal limits  PRO B NATRIURETIC PEPTIDE - Abnormal; Notable for the following:    Pro B Natriuretic peptide (BNP) 660.2 (*)    All other components within normal limits  URINALYSIS, ROUTINE W REFLEX MICROSCOPIC - Abnormal; Notable for the following:    APPearance CLOUDY (*)    Hgb urine dipstick SMALL (*)    Bilirubin Urine SMALL (*)    Leukocytes, UA MODERATE (*)    All other components within normal limits  COMPREHENSIVE METABOLIC PANEL - Abnormal; Notable for the following:    Glucose, Bld 106 (*)    BUN 28 (*)    Calcium 10.7 (*)     GFR calc non Af Amer 45 (*)    GFR calc Af Amer 52 (*)    Anion gap 22 (*)    All other components within normal limits  LIPASE, BLOOD - Abnormal; Notable for the following:    Lipase 102 (*)    All other components within normal limits  URINE MICROSCOPIC-ADD ON - Abnormal; Notable for the following:    Crystals CA OXALATE CRYSTALS (*)    All other components within normal limits  URINE CULTURE  TROPONIN I    Imaging Review Dg Chest 2 View  04/12/2014   CLINICAL DATA:  Nausea, vomiting and diarrhea beginning today.  EXAM: CHEST  2  VIEW  COMPARISON:  CT chest and 10/15/2011. PA and lateral chest 10/09/2009.  FINDINGS: Heart size is upper normal. Pacing device is in place. Mild subsegmental atelectasis is seen in the left lung base. The lungs are otherwise clear. No pneumothorax or pleural effusion is identified.  IMPRESSION: No acute disease.   Electronically Signed   By: Inge Rise M.D.   On: 04/12/2014 16:33   Ct Abdomen Pelvis W Contrast  04/12/2014   CLINICAL DATA:  Nausea and vomiting after taking antibiotics. Generalized abdominal pain.  EXAM: CT ABDOMEN AND PELVIS WITH CONTRAST  TECHNIQUE: Multidetector CT imaging of the abdomen and pelvis was performed using the standard protocol following bolus administration of intravenous contrast.  CONTRAST:  148mL OMNIPAQUE IOHEXOL 300 MG/ML  SOLN  COMPARISON:  10/15/2011  FINDINGS: Lower chest: Bandlike opacities in both lower lobes, the right middle lobe, and lingula favoring subsegmental atelectasis or scarring. Pacer leads observed. Mild cardiomegaly.  Hepatobiliary: Cholecystectomy.  Otherwise unremarkable.  Pancreas: Unremarkable  Spleen: Unremarkable  Adrenals/Urinary Tract: Multi cystic dysplastic left kidney. Bosniak category 1 3.9 cm right mid kidney cyst anteriorly. Bosniak category 2 2.7 cm right kidney lower pole cyst, image 40 of series 2, faint internal septation.  Stomach/Bowel: Periampullary duodenal diverticulum. Extensive  diffuse colonic diverticulosis, particularly in the sigmoid colon, without acute diverticulitis. No dilated bowel. Scattered small bowel diverticulosis.  Vascular/Lymphatic: Aortoiliac atherosclerotic vascular disease.  Reproductive: Uterus absent.  Ovaries unremarkable.  Other: No supplemental non-categorized findings.  Musculoskeletal: Mild left foraminal stenosis at L4-5 due to disc osteophyte complex.  IMPRESSION: 1. A specific cause for the patient's nausea and vomiting is not identified. There is rather diffuse small and large bowel diverticulosis without signs of inflammation. 2. Benign appearing right renal cysts. Multi cystic dysplastic left kidney. 3. Mild left foraminal stenosis at L4-5 due to disc osteophyte complex. 4. Atherosclerosis. 5. Bibasilar subsegmental atelectasis or scarring. 6. Mild cardiomegaly.   Electronically Signed   By: Sherryl Barters M.D.   On: 04/12/2014 20:03     EKG Interpretation   Date/Time:  Tuesday April 12 2014 15:32:09 EST Ventricular Rate:  70 PR Interval:  74 QRS Duration: 148 QT Interval:  466 QTC Calculation: 503 R Axis:   -94 Text Interpretation:  Ventricular-paced rhythm No further analysis  attempted due to paced rhythm Confirmed by POLLINA  MD, [place]  [phone]) on 04/12/2014 5:10:41 PM      MDM   Final diagnoses:  Abdominal pain  Non-intractable vomiting with nausea, vomiting of unspecified type  Diarrhea    A 35-year-old female to chronic wound on her right lower leg and started Augmentin today 12 hours later had an episode of vomiting and also diarrhea. Some minimal epigastric pain just prior to vomiting but not persistently. No fevers no recent sick contacts no recent illnesses otherwise. Exam appears well no abdominal tenderness bowel signs are okay. Lissa Hoard a reaction to the medication however secondary to her age we checked labs which showed elevated lipase, slightly elevated white blood cell count however was hemoconcentrated.  Urinalysis had leukocytosis and rare bacteria but the patient is asymptomatic and there is no nitrites we will get a culture to ensure that is not a urinary tract infection. We got CT scan to evaluate her pancreas to ensure no pancreatitis with her slightly elevated lipase however it was negative. She had no evidence of diverticulitis on the CT either. Patient able to tolerate fluids by mouth. Ambulate at baseline. Had 2 L of fluids here which likely  help resolve her anion gap which is probably secondary to dehydration. Will continue to drink fluid at home and follow-up with wound care about the need for PO antibiotics and use topical antibiotics in the mean time.    Merrily Pew, MD 04/13/14 0023  Merrily Pew, MD 04/13/14 3343  Orpah Greek, MD 04/13/14 [phone]

## 2014-04-13 ENCOUNTER — Other Ambulatory Visit: Payer: Medicare Other

## 2014-04-14 ENCOUNTER — Other Ambulatory Visit: Payer: Self-pay | Admitting: Nurse Practitioner

## 2014-04-14 LAB — URINE CULTURE

## 2014-04-15 ENCOUNTER — Ambulatory Visit: Payer: Medicare Other | Admitting: Hematology and Oncology

## 2014-04-15 ENCOUNTER — Other Ambulatory Visit: Payer: Medicare Other

## 2014-04-19 DIAGNOSIS — L98499 Non-pressure chronic ulcer of skin of other sites with unspecified severity: Secondary | ICD-10-CM | POA: Diagnosis not present

## 2014-05-02 ENCOUNTER — Ambulatory Visit (INDEPENDENT_AMBULATORY_CARE_PROVIDER_SITE_OTHER): Payer: Medicare Other | Admitting: *Deleted

## 2014-05-02 DIAGNOSIS — I442 Atrioventricular block, complete: Secondary | ICD-10-CM

## 2014-05-02 LAB — MDC_IDC_ENUM_SESS_TYPE_INCLINIC
Battery Impedance: 3219 Ohm
Brady Statistic RV Percent Paced: 100 %
Date Time Interrogation Session: 20160104095617
Lead Channel Impedance Value: 418 Ohm
Lead Channel Pacing Threshold Amplitude: 0.75 V
Lead Channel Pacing Threshold Pulse Width: 0.4 ms
Lead Channel Setting Pacing Amplitude: 2.5 V
Lead Channel Setting Pacing Pulse Width: 0.4 ms
MDC IDC MSMT BATTERY REMAINING LONGEVITY: 15 mo
MDC IDC MSMT BATTERY VOLTAGE: 2.69 V
MDC IDC MSMT LEADCHNL RA IMPEDANCE VALUE: 0 Ohm
MDC IDC SET LEADCHNL RV SENSING SENSITIVITY: 2.8 mV

## 2014-05-02 NOTE — Progress Notes (Signed)
Pacemaker check in clinic. Normal device function. Thresholds, sensing, impedances consistent with previous measurements. Device programmed to maximize longevity. No high ventricular rates noted. Device programmed at appropriate safety margins. Histogram distribution appropriate for patient activity level. Device programmed to optimize intrinsic conduction. Estimated longevity 15 months with range of 6 to 24 months. Patient enrolled in remote follow-up and cellular adapter ordered for pt. [facility]link 08-01-14 and ROV in July with JA.

## 2014-05-03 DIAGNOSIS — L98499 Non-pressure chronic ulcer of skin of other sites with unspecified severity: Secondary | ICD-10-CM | POA: Diagnosis not present

## 2014-05-11 ENCOUNTER — Ambulatory Visit
Admission: RE | Admit: 2014-05-11 | Discharge: 2014-05-11 | Disposition: A | Discharge status: Home / Self Care | Payer: Medicare Other | Source: Ambulatory Visit | Attending: Hematology and Oncology | Admitting: Hematology and Oncology

## 2014-05-11 DIAGNOSIS — C50919 Malignant neoplasm of unspecified site of unspecified female breast: Secondary | ICD-10-CM

## 2014-05-11 DIAGNOSIS — Z780 Asymptomatic menopausal state: Secondary | ICD-10-CM | POA: Diagnosis not present

## 2014-05-11 DIAGNOSIS — Z853 Personal history of malignant neoplasm of breast: Secondary | ICD-10-CM | POA: Diagnosis not present

## 2014-05-11 DIAGNOSIS — M85852 Other specified disorders of bone density and structure, left thigh: Secondary | ICD-10-CM | POA: Diagnosis not present

## 2014-05-11 DIAGNOSIS — M85832 Other specified disorders of bone density and structure, left forearm: Secondary | ICD-10-CM | POA: Diagnosis not present

## 2014-05-11 DIAGNOSIS — M8580 Other specified disorders of bone density and structure, unspecified site: Secondary | ICD-10-CM

## 2014-05-23 ENCOUNTER — Encounter: Payer: Self-pay | Admitting: Internal Medicine

## 2014-05-25 ENCOUNTER — Encounter: Payer: Self-pay | Admitting: *Deleted

## 2014-05-25 NOTE — Progress Notes (Signed)
[facility]. Sent to scan.

## 2014-05-31 DIAGNOSIS — N289 Disorder of kidney and ureter, unspecified: Secondary | ICD-10-CM | POA: Diagnosis not present

## 2014-05-31 DIAGNOSIS — I83013 Varicose veins of right lower extremity with ulcer of ankle: Secondary | ICD-10-CM | POA: Diagnosis not present

## 2014-06-07 DIAGNOSIS — R339 Retention of urine, unspecified: Secondary | ICD-10-CM | POA: Diagnosis not present

## 2014-06-07 DIAGNOSIS — R351 Nocturia: Secondary | ICD-10-CM | POA: Diagnosis not present

## 2014-06-13 ENCOUNTER — Other Ambulatory Visit: Payer: Self-pay | Admitting: Hematology and Oncology

## 2014-06-13 DIAGNOSIS — C50919 Malignant neoplasm of unspecified site of unspecified female breast: Secondary | ICD-10-CM

## 2014-06-14 ENCOUNTER — Telehealth: Payer: Self-pay | Admitting: Hematology and Oncology

## 2014-06-14 NOTE — Telephone Encounter (Signed)
, °

## 2014-06-21 ENCOUNTER — Telehealth: Payer: Self-pay | Admitting: Hematology and Oncology

## 2014-06-21 NOTE — Telephone Encounter (Signed)
Pt called in to r/s her appt to 3/11,done  anne

## 2014-06-29 DIAGNOSIS — L98499 Non-pressure chronic ulcer of skin of other sites with unspecified severity: Secondary | ICD-10-CM | POA: Diagnosis not present

## 2014-07-06 DIAGNOSIS — R351 Nocturia: Secondary | ICD-10-CM | POA: Diagnosis not present

## 2014-07-07 ENCOUNTER — Other Ambulatory Visit: Payer: Medicare Other

## 2014-07-07 ENCOUNTER — Ambulatory Visit: Payer: Medicare Other | Admitting: Hematology and Oncology

## 2014-07-08 ENCOUNTER — Telehealth: Payer: Self-pay | Admitting: Hematology and Oncology

## 2014-07-08 ENCOUNTER — Other Ambulatory Visit (HOSPITAL_BASED_OUTPATIENT_CLINIC_OR_DEPARTMENT_OTHER): Payer: Medicare Other

## 2014-07-08 ENCOUNTER — Other Ambulatory Visit: Payer: Self-pay

## 2014-07-08 ENCOUNTER — Ambulatory Visit (HOSPITAL_BASED_OUTPATIENT_CLINIC_OR_DEPARTMENT_OTHER): Payer: Medicare Other | Admitting: Hematology and Oncology

## 2014-07-08 VITALS — BP 134/70 | HR 87 | Temp 98.7°F | Resp 18 | Ht 67.5 in | Wt 223.1 lb

## 2014-07-08 DIAGNOSIS — C50919 Malignant neoplasm of unspecified site of unspecified female breast: Secondary | ICD-10-CM

## 2014-07-08 DIAGNOSIS — Z170 Estrogen receptor positive status [ER+]: Secondary | ICD-10-CM

## 2014-07-08 DIAGNOSIS — C50911 Malignant neoplasm of unspecified site of right female breast: Secondary | ICD-10-CM | POA: Diagnosis not present

## 2014-07-08 DIAGNOSIS — C50912 Malignant neoplasm of unspecified site of left female breast: Secondary | ICD-10-CM

## 2014-07-08 DIAGNOSIS — M8580 Other specified disorders of bone density and structure, unspecified site: Secondary | ICD-10-CM | POA: Diagnosis not present

## 2014-07-08 DIAGNOSIS — Z85528 Personal history of other malignant neoplasm of kidney: Secondary | ICD-10-CM

## 2014-07-08 LAB — CBC WITH DIFFERENTIAL/PLATELET
BASO%: 0.6 % (ref 0.0–2.0)
Basophils Absolute: 0 10*3/uL (ref 0.0–0.1)
EOS ABS: 0.8 10*3/uL — AB (ref 0.0–0.5)
EOS%: 9.9 % — ABNORMAL HIGH (ref 0.0–7.0)
HCT: 46.6 % (ref 34.8–46.6)
HEMOGLOBIN: 15 g/dL (ref 11.6–15.9)
LYMPH%: 17.6 % (ref 14.0–49.7)
MCH: 30.9 pg (ref 25.1–34.0)
MCHC: 32.2 g/dL (ref 31.5–36.0)
MCV: 95.9 fL (ref 79.5–101.0)
MONO#: 0.7 10*3/uL (ref 0.1–0.9)
MONO%: 9.2 % (ref 0.0–14.0)
NEUT%: 62.7 % (ref 38.4–76.8)
NEUTROS ABS: 4.9 10*3/uL (ref 1.5–6.5)
Platelets: 231 10*3/uL (ref 145–400)
RBC: 4.86 10*6/uL (ref 3.70–5.45)
RDW: 13.4 % (ref 11.2–14.5)
WBC: 7.8 10*3/uL (ref 3.9–10.3)
lymph#: 1.4 10*3/uL (ref 0.9–3.3)

## 2014-07-08 LAB — COMPREHENSIVE METABOLIC PANEL (CC13)
ALBUMIN: 3.8 g/dL (ref 3.5–5.0)
ALK PHOS: 76 U/L (ref 40–150)
ALT: 14 U/L (ref 0–55)
ANION GAP: 13 meq/L — AB (ref 3–11)
AST: 15 U/L (ref 5–34)
BILIRUBIN TOTAL: 0.44 mg/dL (ref 0.20–1.20)
BUN: 24.2 mg/dL (ref 7.0–26.0)
CALCIUM: 9.8 mg/dL (ref 8.4–10.4)
CO2: 26 mEq/L (ref 22–29)
Chloride: 105 mEq/L (ref 98–109)
Creatinine: 1.1 mg/dL (ref 0.6–1.1)
EGFR: 45 mL/min/{1.73_m2} — ABNORMAL LOW (ref >90)
GLUCOSE: 87 mg/dL (ref 70–140)
Potassium: 3.9 mEq/L (ref 3.5–5.1)
Sodium: 144 mEq/L (ref 136–145)
Total Protein: 7 g/dL (ref 6.4–8.3)

## 2014-07-08 NOTE — Telephone Encounter (Signed)
per pof to sch pt appt-gave pt copy of sch °

## 2014-07-08 NOTE — Progress Notes (Signed)
Patient Care Team: [name], MD as PCP - General (Internal Medicine)  DIAGNOSIS: No matching staging information was found for the patient.  SUMMARY OF ONCOLOGIC HISTORY:   Bilateral breast cancer   04/21/2007 Breast MRI Right breast lower inner quadrant enhancement 8.9 x 4.2 x 4 cm. Biopsy-proven invasive ductal carcinoma Left breast 2 discrete irregular nodules 12 x 8 x 9 mm 11 x 11 x 8 mm portal 3 cm. Biopsy-proven invasive ductal carcinoma   05/05/2007 Surgery Bilateral lumpectomies. Left breast multifocal invasive ductal carcinoma with DCIS 1 sentinel node negative. Right breast multifocal invasive ductal carcinoma 1.4 and 0.7 cm, 3 sentinel lymph nodes -1 showed isolated tumor cells   09/09/2007 Procedure Renal cell cancer status post nephrectomy   09/11/2007 -  Anti-estrogen oral therapy Femara 2.5 mg once daily   03/25/2011 Initial Diagnosis Breast cancer    - 09/10/2007 Radiation Therapy Radiation therapy to both breasts the lumpectomy sites    CHIEF COMPLIANT:  Right leg wound is healing better  INTERVAL HISTORY: [name] is a  81 year old lady with above-mentioned history of right-sided breast cancer who has been on oral antiestrogen therapy for over 5 years. I previously discussed with her about discontinuing antiestrogen therapy but she is not keen on stopping this treatment. She wants to continue with it. She denies having any problems or concerns. She is primarily concerned today about getting furosemide refilled. Especially the type of furosemide that she thinks works better for her. She went to great lengths describing to me her problems with the furosemide that she got from Medicare.  REVIEW OF SYSTEMS:   Constitutional: Denies fevers, chills or abnormal weight loss Eyes: Denies blurriness of vision Ears, nose, mouth, throat, and face: Denies mucositis or sore throat Respiratory: Denies cough, dyspnea or wheezes Cardiovascular: Denies palpitation, chest discomfort or  lower extremity swelling Gastrointestinal:  Denies nausea, heartburn or change in bowel habits Skin: Denies abnormal skin rashes Lymphatics: Denies new lymphadenopathy or easy bruising Neurological:Denies numbness, tingling or new weaknesses Behavioral/Psych: Mood is stable, no new changes  Breast:  denies any pain or lumps or nodules in either breasts All other systems were reviewed with the patient and are negative.  I have reviewed the past medical history, past surgical history, social history and family history with the patient and they are unchanged from previous note.  ALLERGIES:  is allergic to erythromycin and tape.  MEDICATIONS:  Current Outpatient Prescriptions  Medication Sig Dispense Refill  . alendronate (FOSAMAX) 35 MG tablet TAKE 1 TABLET EVERY 7 DAYS WITH A FULL GLASS OF WATER ON AN EMPTY STOMACH. 12 tablet 0  . aspirin 81 MG tablet Take 81 mg by mouth daily.      . Calcium Carbonate-Vitamin D (CALCIUM 600+D PO) Take 1 tablet by mouth daily.     . cholecalciferol 2000 UNITS tablet Take 1 capsule daily or as directed 90 tablet 7  . furosemide (LASIX) 20 MG tablet Take 20 mg by mouth daily.      Marland Kitchen HIBICLENS 4 % external liquid Apply 1 application topically daily as needed. Wash leg wound once daily  2  . letrozole (FEMARA) 2.5 MG tablet TAKE 1 TABLET DAILY 90 tablet 6  . levothyroxine (SYNTHROID, LEVOTHROID) 50 MCG tablet Take 50 mcg by mouth daily.      . midodrine (PROAMATINE) 2.5 MG tablet Take 1 tablet (2.5 mg total) by mouth 2 (two) times daily. 180 tablet 3  . nitroGLYCERIN (NITRODUR - DOSED IN MG/24 HR) 0.1  mg/hr patch   4  . silver sulfADIAZINE (SILVADENE) 1 % cream   13  . triamcinolone cream (KENALOG) 0.5 % Apply to affected area twice daily externally 14 days  2  . WELCHOL 625 MG tablet Take 0.5 tablets by mouth daily.  2  . ondansetron (ZOFRAN ODT) 4 MG disintegrating tablet 4mg  ODT q4 hours prn nausea/vomit (Patient not taking: Reported on 07/08/2014) 10 tablet  0   No current facility-administered medications for this visit.    PHYSICAL EXAMINATION: ECOG PERFORMANCE STATUS: 1 - Symptomatic but completely ambulatory  Filed Vitals:   07/08/14 1221  BP: 134/70  Pulse: 87  Temp: 98.7 F (37.1 C)  Resp: 18   Filed Weights   07/08/14 1221  Weight: 223 lb 1.6 oz (101.197 kg)    GENERAL:alert, no distress and comfortable SKIN: skin color, texture, turgor are normal, no rashes or significant lesions EYES: normal, Conjunctiva are pink and non-injected, sclera clear OROPHARYNX:no exudate, no erythema and lips, buccal mucosa, and tongue normal  NECK: supple, thyroid normal size, non-tender, without nodularity LYMPH:  no palpable lymphadenopathy in the cervical, axillary or inguinal LUNGS: clear to auscultation and percussion with normal breathing effort HEART: regular rate & rhythm and no murmurs and no lower extremity edema ABDOMEN:abdomen soft, non-tender and normal bowel sounds Musculoskeletal:no cyanosis of digits and no clubbing  NEURO: alert & oriented x 3 with fluent speech, no focal motor/sensory deficits BREAST: No palpable masses or nodules in either right or left breasts. No palpable axillary supraclavicular or infraclavicular adenopathy no breast tenderness or nipple discharge. (exam performed in the presence of a chaperone)  LABORATORY DATA:  I have reviewed the data as listed   Chemistry      Component Value Date/Time   NA 144 07/08/2014 1145   NA 143 04/12/2014 1500   K 3.9 07/08/2014 1145   K 4.4 04/12/2014 1500   CL 99 04/12/2014 1500   CL 106 03/03/2012 0831   CO2 26 07/08/2014 1145   CO2 22 04/12/2014 1500   BUN 24.2 07/08/2014 1145   BUN 28* 04/12/2014 1500   CREATININE 1.1 07/08/2014 1145   CREATININE 1.10 04/12/2014 1500   CREATININE 1.23* 12/22/2012 0858      Component Value Date/Time   CALCIUM 9.8 07/08/2014 1145   CALCIUM 10.7* 04/12/2014 1500   ALKPHOS 76 07/08/2014 1145   ALKPHOS 98 04/12/2014 1500    AST 15 07/08/2014 1145   AST 21 04/12/2014 1500   ALT 14 07/08/2014 1145   ALT 14 04/12/2014 1500   BILITOT 0.44 07/08/2014 1145   BILITOT 0.4 04/12/2014 1500       Lab Results  Component Value Date   WBC 7.8 07/08/2014   HGB 15.0 07/08/2014   HCT 46.6 07/08/2014   MCV 95.9 07/08/2014   PLT 231 07/08/2014   NEUTROABS 4.9 07/08/2014   ASSESSMENT & PLAN: Bilateral breast cancers status post bilateral lumpectomies followed by bilateral radiation treatments to the breast and currently on Femara 2.5 mg by mouth daily since 2009. Patient has been tolerating Femara extremely well without any major problems. She said today walking with a walker. She has not had a mammogram since 2013 but she would like to start back on mammograms again. We discussed whether discontinuation of Femara is an option but the patient was reluctant to stop it and wanted to continue it  Indefinitely.   breast cancer surveillance: 1.  Mammograms December were normal 2.  Bone density showed osteopenia T score -2.2  2013 3.  Return to clinic in 6 months for follow-up   Renal cell cancer: Status post nephrectomy    No orders of the defined types were placed in this encounter.   The patient has a good understanding of the overall plan. she agrees with it. She will call with any problems that may develop before her next visit here.   Rulon Eisenmenger, MD

## 2014-07-15 ENCOUNTER — Telehealth: Payer: Self-pay | Admitting: *Deleted

## 2014-07-15 NOTE — Telephone Encounter (Signed)
Call received from patient and friend Carlyle Basques who is trying to console her.  Report Summary of visit has diagnosis of breast cancer...comma and unilateral.  Don't understand what this means.  Has the cancer returned?   Explained these are diagnosis codes for insurance.

## 2014-07-20 DIAGNOSIS — L98499 Non-pressure chronic ulcer of skin of other sites with unspecified severity: Secondary | ICD-10-CM | POA: Diagnosis not present

## 2014-07-20 DIAGNOSIS — M8580 Other specified disorders of bone density and structure, unspecified site: Secondary | ICD-10-CM | POA: Diagnosis not present

## 2014-07-20 DIAGNOSIS — E039 Hypothyroidism, unspecified: Secondary | ICD-10-CM | POA: Diagnosis not present

## 2014-08-01 ENCOUNTER — Telehealth: Payer: Self-pay | Admitting: Cardiology

## 2014-08-01 ENCOUNTER — Ambulatory Visit (INDEPENDENT_AMBULATORY_CARE_PROVIDER_SITE_OTHER): Payer: Medicare Other | Admitting: *Deleted

## 2014-08-01 DIAGNOSIS — I442 Atrioventricular block, complete: Secondary | ICD-10-CM | POA: Diagnosis not present

## 2014-08-01 LAB — MDC_IDC_ENUM_SESS_TYPE_REMOTE
Battery Voltage: 2.67 V
Brady Statistic RV Percent Paced: 100 %
Date Time Interrogation Session: 20160404164724
Lead Channel Impedance Value: 0 Ohm
Lead Channel Impedance Value: 392 Ohm
Lead Channel Pacing Threshold Pulse Width: 0.4 ms
Lead Channel Setting Pacing Amplitude: 2.5 V
Lead Channel Setting Sensing Sensitivity: 2.8 mV
MDC IDC MSMT BATTERY IMPEDANCE: 3826 Ohm
MDC IDC MSMT BATTERY REMAINING LONGEVITY: 11 mo
MDC IDC MSMT LEADCHNL RV PACING THRESHOLD AMPLITUDE: 1 V
MDC IDC SET LEADCHNL RV PACING PULSEWIDTH: 0.4 ms

## 2014-08-01 NOTE — Telephone Encounter (Signed)
Spoke with pt and reminded pt of remote transmission that is due today. Pt verbalized understanding.   

## 2014-08-01 NOTE — Progress Notes (Signed)
Remote pacemaker transmission.   

## 2014-08-15 ENCOUNTER — Encounter: Payer: Self-pay | Admitting: Cardiology

## 2014-08-16 ENCOUNTER — Encounter: Payer: Self-pay | Admitting: Internal Medicine

## 2014-08-17 DIAGNOSIS — M8580 Other specified disorders of bone density and structure, unspecified site: Secondary | ICD-10-CM | POA: Diagnosis not present

## 2014-08-17 DIAGNOSIS — E039 Hypothyroidism, unspecified: Secondary | ICD-10-CM | POA: Diagnosis not present

## 2014-08-17 DIAGNOSIS — E559 Vitamin D deficiency, unspecified: Secondary | ICD-10-CM | POA: Diagnosis not present

## 2014-09-01 ENCOUNTER — Ambulatory Visit (INDEPENDENT_AMBULATORY_CARE_PROVIDER_SITE_OTHER): Payer: Medicare Other | Admitting: *Deleted

## 2014-09-01 DIAGNOSIS — I442 Atrioventricular block, complete: Secondary | ICD-10-CM | POA: Diagnosis not present

## 2014-09-01 DIAGNOSIS — Z950 Presence of cardiac pacemaker: Secondary | ICD-10-CM

## 2014-09-04 ENCOUNTER — Other Ambulatory Visit: Payer: Self-pay | Admitting: Hematology and Oncology

## 2014-09-04 LAB — CUP PACEART REMOTE DEVICE CHECK
Battery Impedance: 4295 Ohm
Battery Voltage: 2.67 V
Date Time Interrogation Session: 20160505100710
Lead Channel Pacing Threshold Amplitude: 1.125 V
Lead Channel Setting Pacing Pulse Width: 0.4 ms
MDC IDC MSMT BATTERY REMAINING LONGEVITY: 9 mo
MDC IDC MSMT LEADCHNL RA IMPEDANCE VALUE: 0 Ohm
MDC IDC MSMT LEADCHNL RV IMPEDANCE VALUE: 401 Ohm
MDC IDC MSMT LEADCHNL RV PACING THRESHOLD PULSEWIDTH: 0.4 ms
MDC IDC SET LEADCHNL RV PACING AMPLITUDE: 2.5 V
MDC IDC SET LEADCHNL RV SENSING SENSITIVITY: 2.8 mV
MDC IDC STAT BRADY RV PERCENT PACED: 100 %

## 2014-09-04 NOTE — Progress Notes (Signed)
Pacemaker remote check. Device function reviewed. Impedance, auto capture threshold consistent with previous measurements. Histograms appropriate for patient and level of activity. All other diagnostic data reviewed and is appropriate and stable for patient. Real time/magnet EGM shows appropriate capture. No ventricular high rate episodes. Estimated longevity 32mo w/ range of <1-12mo. [facility]link for battery only 10/03/14. ROV w/ JA 12/08/14.

## 2014-09-05 ENCOUNTER — Other Ambulatory Visit: Payer: Self-pay | Admitting: *Deleted

## 2014-09-05 DIAGNOSIS — C50919 Malignant neoplasm of unspecified site of unspecified female breast: Secondary | ICD-10-CM

## 2014-09-05 MED ORDER — ALENDRONATE SODIUM 35 MG PO TABS: ORAL_TABLET | ORAL | Status: DC

## 2014-09-08 ENCOUNTER — Encounter: Payer: Self-pay | Admitting: Cardiology

## 2014-09-14 ENCOUNTER — Encounter: Payer: Self-pay | Admitting: Internal Medicine

## 2014-10-03 ENCOUNTER — Telehealth: Payer: Self-pay | Admitting: Cardiology

## 2014-10-03 ENCOUNTER — Ambulatory Visit (INDEPENDENT_AMBULATORY_CARE_PROVIDER_SITE_OTHER): Payer: Medicare Other | Admitting: *Deleted

## 2014-10-03 DIAGNOSIS — I442 Atrioventricular block, complete: Secondary | ICD-10-CM

## 2014-10-03 NOTE — Telephone Encounter (Signed)
Spoke with pt and reminded pt of remote transmission that is due today. Pt verbalized understanding.   

## 2014-10-03 NOTE — Progress Notes (Signed)
Remote pacemaker transmission.   

## 2014-10-06 LAB — CUP PACEART REMOTE DEVICE CHECK
Battery Impedance: 4596 Ohm
Battery Voltage: 2.66 V
Brady Statistic RV Percent Paced: 100 %
Lead Channel Impedance Value: 0 Ohm
Lead Channel Pacing Threshold Amplitude: 1 V
Lead Channel Pacing Threshold Pulse Width: 0.4 ms
Lead Channel Setting Pacing Amplitude: 2.5 V
Lead Channel Setting Pacing Pulse Width: 0.4 ms
MDC IDC MSMT BATTERY REMAINING LONGEVITY: 8 mo
MDC IDC MSMT LEADCHNL RV IMPEDANCE VALUE: 403 Ohm
MDC IDC SESS DTM: 20160606165444
MDC IDC SET LEADCHNL RV SENSING SENSITIVITY: 2.8 mV

## 2014-10-09 ENCOUNTER — Other Ambulatory Visit: Payer: Self-pay | Admitting: Internal Medicine

## 2014-10-11 DIAGNOSIS — R339 Retention of urine, unspecified: Secondary | ICD-10-CM | POA: Diagnosis not present

## 2014-10-12 ENCOUNTER — Telehealth: Payer: Self-pay | Admitting: Internal Medicine

## 2014-10-12 NOTE — Telephone Encounter (Signed)
New message  Pt calling about recent remote pacer checks- per pt: pt has been using nitroglycerin patches for ankles while remote checks were being done and feels like the results were skewed. Pt had pulse checked @ PCP yesterday 6/14 and it was 80- pt feels like it is too low. Please call back and discuss.

## 2014-10-13 NOTE — Telephone Encounter (Signed)
Patient wanted to know if it was time to see Dr.Allred since her ppm is nearing ERI. I explained to her that her device is estimated to last another 8 months with a range of <1-16 months remaining. Patient voiced understanding and appreciation for info given.

## 2014-10-18 ENCOUNTER — Encounter: Payer: Self-pay | Admitting: Cardiology

## 2014-10-19 ENCOUNTER — Encounter: Payer: Self-pay | Admitting: Internal Medicine

## 2014-11-02 DIAGNOSIS — H43811 Vitreous degeneration, right eye: Secondary | ICD-10-CM | POA: Diagnosis not present

## 2014-11-02 DIAGNOSIS — H40013 Open angle with borderline findings, low risk, bilateral: Secondary | ICD-10-CM | POA: Diagnosis not present

## 2014-11-02 DIAGNOSIS — H26492 Other secondary cataract, left eye: Secondary | ICD-10-CM | POA: Diagnosis not present

## 2014-11-02 DIAGNOSIS — Z961 Presence of intraocular lens: Secondary | ICD-10-CM | POA: Diagnosis not present

## 2014-11-16 DIAGNOSIS — H43811 Vitreous degeneration, right eye: Secondary | ICD-10-CM | POA: Diagnosis not present

## 2014-11-16 DIAGNOSIS — Z961 Presence of intraocular lens: Secondary | ICD-10-CM | POA: Diagnosis not present

## 2014-11-16 DIAGNOSIS — H40013 Open angle with borderline findings, low risk, bilateral: Secondary | ICD-10-CM | POA: Diagnosis not present

## 2014-11-17 ENCOUNTER — Encounter: Payer: Self-pay | Admitting: Cardiovascular Disease

## 2014-11-28 ENCOUNTER — Ambulatory Visit (INDEPENDENT_AMBULATORY_CARE_PROVIDER_SITE_OTHER): Payer: Medicare Other | Admitting: Internal Medicine

## 2014-11-28 ENCOUNTER — Encounter: Payer: Self-pay | Admitting: Internal Medicine

## 2014-11-28 VITALS — BP 120/86 | HR 85 | Ht 67.5 in | Wt 224.6 lb

## 2014-11-28 DIAGNOSIS — I442 Atrioventricular block, complete: Secondary | ICD-10-CM

## 2014-11-28 DIAGNOSIS — Z950 Presence of cardiac pacemaker: Secondary | ICD-10-CM

## 2014-11-28 LAB — CUP PACEART INCLINIC DEVICE CHECK
Battery Voltage: 2.63 V
Brady Statistic RV Percent Paced: 100 %
Lead Channel Impedance Value: 390 Ohm
Lead Channel Pacing Threshold Amplitude: 1 V
Lead Channel Setting Pacing Pulse Width: 0.4 ms
Lead Channel Setting Sensing Sensitivity: 2.8 mV
MDC IDC MSMT BATTERY IMPEDANCE: 5314 Ohm
MDC IDC MSMT BATTERY REMAINING LONGEVITY: 6 mo
MDC IDC MSMT LEADCHNL RA IMPEDANCE VALUE: 0 Ohm
MDC IDC MSMT LEADCHNL RV PACING THRESHOLD PULSEWIDTH: 0.4 ms
MDC IDC SESS DTM: 20160801125958
MDC IDC SET LEADCHNL RV PACING AMPLITUDE: 2.5 V

## 2014-11-28 NOTE — Progress Notes (Signed)
Nichole Gravel, MD: Primary Cardiologist:  Dr Martinique  [name] is a 81 y.o. female with a h/o complete heart block sp PPM (MDT) by Dr Blanch Media who presents today for follow-up in the Electrophysiology device clinic. She appears to be doing reasonably well at this time. Today, she  denies symptoms of palpitations, chest pain, shortness of breath, orthopnea, PND, lower extremity edema, dizziness, presyncope, syncope, or neurologic sequela.  The patientis tolerating medications without difficulties and is otherwise without complaint today.   Past Medical History  Diagnosis Date  . 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 Cataract   . Hypercholesterolemia   . Thyroid disease   . Complete heart block     s/p PPM implant (MDT) by Dr Blanch Media.  Atrial lead could not be paced at time of the procedure.  She has chronic AV dysociation  . Orthostatic hypotension     treated with midodrine by Dr Rollene [name]  . Exogenous obesity   . Venous insufficiency    Past Surgical History  Procedure Laterality Date  . Abdominal hysterectomy    . Cholecystectomy    . Cataract extraction      EYE SURGERY X 2  . Pacemaker insertion      MDT implanted by Dr Blanch Media.  Atrial lead placement was unsuccessful.  She has chronic AV dysociation  . Remote right radical nephrectomy  2009  . Bilateral lumpectomies  2009    History   Social History  . Marital Status: Married    Spouse Name: N/A  . Number of Children: N/A  . Years of Education: N/A   Occupational History  . Not on file.   Social History Main Topics  . Smoking status: Never Smoker   . Smokeless tobacco: Not on file  . Alcohol Use: No  . Drug Use: No  . Sexual Activity: Not Currently   Other Topics Concern  . Not on file   Social History Narrative    Family History  Problem Relation Age of Onset  . Heart disease Maternal Grandmother   . Heart disease Mother     Allergies  Allergen Reactions  .  Erythromycin Hives  . Tape     BAND AIDS-SKIN IRRITATION    Current Outpatient Prescriptions  Medication Sig Dispense Refill  . alendronate (FOSAMAX) 35 MG tablet TAKE 1 TABLET EVERY 7 DAYS WITH A FULL GLASS OF WATER ON AN EMPTY STOMACH. (Patient taking differently: TAKE 1 TABLET BY MOUTH  EVERY 7 DAYS WITH A FULL GLASS OF WATER ON AN EMPTY STOMACH.) 12 tablet 1  . aspirin 81 MG tablet Take 81 mg by mouth daily.      . Calcium Carbonate-Vitamin D (CALCIUM 600+D PO) Take 1 tablet by mouth daily.     . cholecalciferol 2000 UNITS tablet Take 1 capsule daily or as directed (Patient taking differently: Take 1 capsule by mouth daily or as directed) 90 tablet 7  . escitalopram (LEXAPRO) 5 MG tablet Take 5 mg by mouth at bedtime.  6  . furosemide (LASIX) 20 MG tablet Take 20 mg by mouth daily.      Marland Kitchen HIBICLENS 4 % external liquid Apply 1 application topically daily as needed. Wash leg wound once daily  2  . letrozole (FEMARA) 2.5 MG tablet TAKE 1 TABLET DAILY (Patient taking differently: TAKE 1 TABLET BY MOUTH DAILY) 90 tablet 6  . levothyroxine (SYNTHROID, LEVOTHROID) 50 MCG tablet Take 50 mcg by mouth daily.      Marland Kitchen  midodrine (PROAMATINE) 2.5 MG tablet TAKE 1 TABLET TWICE A DAY (Patient taking differently: TAKE 1 TABLET BY MOUTH TWICE A DAY) 180 tablet 2  . nitroGLYCERIN (NITRODUR - DOSED IN MG/24 HR) 0.1 mg/hr patch Place 0.1 mg onto the skin daily.   4  . silver sulfADIAZINE (SILVADENE) 1 % cream Apply 1 application topically daily.   13  . triamcinolone cream (KENALOG) 0.5 % Apply 1 application to affected area twice daily externally 14 days  2  . WELCHOL 625 MG tablet Take 0.5 tablets by mouth daily.  2   No current facility-administered medications for this visit.    ROS- all systems are reviewed and negative except as per HPI  Physical Exam: Filed Vitals:   11/28/14 1003  BP: 120/86  Pulse: 85  Height: 5' 7.5" (1.715 m)  Weight: 101.878 kg (224 lb 9.6 oz)    GEN- The patient is  well appearing, alert and oriented x 3 today.   Head- normocephalic, atraumatic Eyes-  Sclera clear, conjunctiva pink Ears- hearing intact Oropharynx- clear Neck- supple  Lungs- Clear to ausculation bilaterally, normal work of breathing Chest- pacemaker pocket is well healed Heart- Regular rate and rhythm (paced) GI- soft, NT, ND, + BS Extremities- no clubbing, cyanosis, or edema Neuro- strength and sensation are intact  Pacemaker interrogation- reviewed in detail today,  See PACEART report  Assessment and Plan:  1. Complete heart block Normal pacemaker function See Pace Art report No changes today She has a VVI device despite sinus rhythm.  I have reviewed Dr Jana Hakim procedure note which indicates that atrial lead placement was not successful.    She is clear that she would not want to have system revision/ atrial lead placement.  She is approaching ERI.  We will anticipate gen change only at that time given her preference.  In addition, she describes wound dehiscence/ exposed device at time of implant which took some effort to heal.  Her skin is somewhat retracted over the pocket.  I had a long discussion today about my concerns for indolent infection and possible pocket infection at time of gen change.  She understands.  Given absence of clinical symptoms, if her pocket looks ok at time of gen change, I think it would be reasonable to use a Tyrex pouch and proceed with gen change.  We can discuss this more as she approaches ERI.  2. Postural dizziness improved  Carelink every month Follow-up with Dr Martinique as scheduled Return to see me in 1 year unless she reaches ERI in the interim.

## 2014-11-28 NOTE — Patient Instructions (Signed)
Medication Instructions:  Your physician recommends that you continue on your current medications as directed. Please refer to the Current Medication list given to you today.   Labwork: None ordered  Testing/Procedures: None ordered  Follow-Up: Remote monitoring is used to monitor your Pacemaker from home. This monitoring reduces the number of office visits required to check your device to one time per year. It allows [place] to keep an eye on the functioning of your device to ensure it is working properly. You are scheduled for a device check from home on 12/29/14. You may send your transmission at any time that day. If you have a wireless device, the transmission will be sent automatically. After your physician reviews your transmission, you will receive a postcard with your next transmission date.  Your physician wants you to follow-up in: 12 months with Dr Vallery Ridge will receive a reminder letter in the mail two months in advance. If you don't receive a letter, please call our office to schedule the follow-up appointment.       Any Other Special Instructions Will Be Listed Below (If Applicable).

## 2014-12-09 DIAGNOSIS — M8580 Other specified disorders of bone density and structure, unspecified site: Secondary | ICD-10-CM | POA: Diagnosis not present

## 2014-12-09 DIAGNOSIS — E039 Hypothyroidism, unspecified: Secondary | ICD-10-CM | POA: Diagnosis not present

## 2014-12-09 DIAGNOSIS — F419 Anxiety disorder, unspecified: Secondary | ICD-10-CM | POA: Diagnosis not present

## 2014-12-09 DIAGNOSIS — L719 Rosacea, unspecified: Secondary | ICD-10-CM | POA: Diagnosis not present

## 2014-12-26 ENCOUNTER — Other Ambulatory Visit: Payer: Self-pay | Admitting: *Deleted

## 2014-12-26 DIAGNOSIS — C50912 Malignant neoplasm of unspecified site of left female breast: Principal | ICD-10-CM

## 2014-12-26 DIAGNOSIS — C50911 Malignant neoplasm of unspecified site of right female breast: Secondary | ICD-10-CM

## 2014-12-26 MED ORDER — LETROZOLE 2.5 MG PO TABS: 2.5000 mg | ORAL_TABLET | Freq: Every day | ORAL | Status: DC

## 2014-12-27 ENCOUNTER — Telehealth: Payer: Self-pay | Admitting: Internal Medicine

## 2014-12-27 NOTE — Telephone Encounter (Signed)
Returned patient's call and advised that we hadn't received her Carelink transmission.  Advised patient to try resending and call us again if she has any issues.  Patient verbalized understanding and denied any additional questions or concerns at this time.

## 2014-12-27 NOTE — Telephone Encounter (Signed)
New Message   4. Are you calling to see if we received your device transmission?   Comments: Pt states that she sent the signal. It stopped on "2" . Requests a call back to determine if it went through please call

## 2014-12-29 ENCOUNTER — Ambulatory Visit (INDEPENDENT_AMBULATORY_CARE_PROVIDER_SITE_OTHER): Payer: Medicare Other | Admitting: *Deleted

## 2014-12-29 DIAGNOSIS — Z950 Presence of cardiac pacemaker: Secondary | ICD-10-CM

## 2014-12-29 LAB — CUP PACEART REMOTE DEVICE CHECK
Battery Impedance: 6350 Ohm
Battery Remaining Longevity: 3 mo
Battery Voltage: 2.59 V
Brady Statistic RV Percent Paced: 100 %
Lead Channel Impedance Value: 0 Ohm
Lead Channel Pacing Threshold Pulse Width: 0.4 ms
Lead Channel Setting Pacing Pulse Width: 0.4 ms
Lead Channel Setting Sensing Sensitivity: 2.8 mV
MDC IDC MSMT LEADCHNL RV IMPEDANCE VALUE: 406 Ohm
MDC IDC MSMT LEADCHNL RV PACING THRESHOLD AMPLITUDE: 1.125 V
MDC IDC SESS DTM: 20160830172120
MDC IDC SET LEADCHNL RV PACING AMPLITUDE: 2.5 V

## 2014-12-29 NOTE — Progress Notes (Signed)
Remote pacemaker transmission.   

## 2015-01-09 ENCOUNTER — Other Ambulatory Visit: Payer: Self-pay

## 2015-01-09 DIAGNOSIS — C50912 Malignant neoplasm of unspecified site of left female breast: Principal | ICD-10-CM

## 2015-01-09 DIAGNOSIS — C50911 Malignant neoplasm of unspecified site of right female breast: Secondary | ICD-10-CM

## 2015-01-10 ENCOUNTER — Ambulatory Visit (HOSPITAL_BASED_OUTPATIENT_CLINIC_OR_DEPARTMENT_OTHER): Payer: Medicare Other | Admitting: Hematology and Oncology

## 2015-01-10 ENCOUNTER — Telehealth: Payer: Self-pay | Admitting: Hematology and Oncology

## 2015-01-10 ENCOUNTER — Encounter: Payer: Self-pay | Admitting: Hematology and Oncology

## 2015-01-10 ENCOUNTER — Other Ambulatory Visit (HOSPITAL_BASED_OUTPATIENT_CLINIC_OR_DEPARTMENT_OTHER): Payer: Medicare Other

## 2015-01-10 VITALS — BP 124/63 | HR 64 | Temp 98.1°F | Resp 19 | Ht 67.5 in | Wt 224.4 lb

## 2015-01-10 DIAGNOSIS — C50911 Malignant neoplasm of unspecified site of right female breast: Secondary | ICD-10-CM

## 2015-01-10 DIAGNOSIS — C50912 Malignant neoplasm of unspecified site of left female breast: Secondary | ICD-10-CM

## 2015-01-10 DIAGNOSIS — M8580 Other specified disorders of bone density and structure, unspecified site: Secondary | ICD-10-CM

## 2015-01-10 LAB — CBC WITH DIFFERENTIAL/PLATELET
BASO%: 0.2 % (ref 0.0–2.0)
BASOS ABS: 0 10*3/uL (ref 0.0–0.1)
EOS%: 3.7 % (ref 0.0–7.0)
Eosinophils Absolute: 0.3 10*3/uL (ref 0.0–0.5)
HEMATOCRIT: 46.5 % (ref 34.8–46.6)
HGB: 15.2 g/dL (ref 11.6–15.9)
LYMPH%: 13.3 % — AB (ref 14.0–49.7)
MCH: 31.5 pg (ref 25.1–34.0)
MCHC: 32.7 g/dL (ref 31.5–36.0)
MCV: 96.3 fL (ref 79.5–101.0)
MONO#: 0.8 10*3/uL (ref 0.1–0.9)
MONO%: 9.3 % (ref 0.0–14.0)
NEUT#: 6.2 10*3/uL (ref 1.5–6.5)
NEUT%: 73.5 % (ref 38.4–76.8)
Platelets: 252 10*3/uL (ref 145–400)
RBC: 4.83 10*6/uL (ref 3.70–5.45)
RDW: 13.4 % (ref 11.2–14.5)
WBC: 8.4 10*3/uL (ref 3.9–10.3)
lymph#: 1.1 10*3/uL (ref 0.9–3.3)

## 2015-01-10 LAB — COMPREHENSIVE METABOLIC PANEL (CC13)
ALT: 12 U/L (ref 0–55)
AST: 13 U/L (ref 5–34)
Albumin: 3.7 g/dL (ref 3.5–5.0)
Alkaline Phosphatase: 89 U/L (ref 40–150)
Anion Gap: 10 mEq/L (ref 3–11)
BUN: 23 mg/dL (ref 7.0–26.0)
CALCIUM: 10 mg/dL (ref 8.4–10.4)
CO2: 27 mEq/L (ref 22–29)
Chloride: 106 mEq/L (ref 98–109)
Creatinine: 1.1 mg/dL (ref 0.6–1.1)
EGFR: 44 mL/min/{1.73_m2} — ABNORMAL LOW (ref >90)
Glucose: 85 mg/dl (ref 70–140)
Potassium: 4.1 mEq/L (ref 3.5–5.1)
SODIUM: 143 meq/L (ref 136–145)
Total Bilirubin: 0.37 mg/dL (ref 0.20–1.20)
Total Protein: 6.9 g/dL (ref 6.4–8.3)

## 2015-01-10 NOTE — Progress Notes (Signed)
Patient Care Team: [name], MD as PCP - General (Internal Medicine)  DIAGNOSIS: No matching staging information was found for the patient.  SUMMARY OF ONCOLOGIC HISTORY:   Bilateral breast cancer   04/21/2007 Breast MRI Right breast lower inner quadrant enhancement 8.9 x 4.2 x 4 cm. Biopsy-proven invasive ductal carcinoma Left breast 2 discrete irregular nodules 12 x 8 x 9 mm 11 x 11 x 8 mm portal 3 cm. Biopsy-proven invasive ductal carcinoma   05/05/2007 Surgery Bilateral lumpectomies. Left breast multifocal invasive ductal carcinoma with DCIS 1 sentinel node negative. Right breast multifocal invasive ductal carcinoma 1.4 and 0.7 cm, 3 sentinel lymph nodes -1 showed isolated tumor cells   09/09/2007 Procedure Renal cell cancer status post nephrectomy   09/11/2007 -  Anti-estrogen oral therapy Femara 2.5 mg once daily   03/25/2011 Initial Diagnosis Breast cancer    - 09/10/2007 Radiation Therapy Radiation therapy to both breasts the lumpectomy sites    CHIEF COMPLIANT: follow-up on Femara  INTERVAL HISTORY: [name] is a 81 year old lady with above-mentioned history of right-sided breast cancer who underwent bilateral lumpectomies with radiation and has been on Femara since May 2009. We had previously discussed discontinuation of Femara but she does not want to stop it. She had a nonhealing wound on the right ankle which is finally healed up. She is very paranoid about additional infections happening on her. She also had renal cell cancer that was resected to a nephrectomy in 2009. She reports no new problems or concerns.  REVIEW OF SYSTEMS:   Constitutional: Denies fevers, chills or abnormal weight loss Eyes: Denies blurriness of vision Ears, nose, mouth, throat, and face: Denies mucositis or sore throat Respiratory: Denies cough, dyspnea or wheezes Cardiovascular: Denies palpitation, chest discomfort or lower extremity swelling Gastrointestinal:  Denies nausea, heartburn or change in  bowel habits Skin: right ankle skin wound Lymphatics: Denies new lymphadenopathy or easy bruising Neurological:Denies numbness, tingling or new weaknesses Behavioral/Psych: Mood is stable, no new changes  Breast:  denies any pain or lumps or nodules in either breasts All other systems were reviewed with the patient and are negative.  I have reviewed the past medical history, past surgical history, social history and family history with the patient and they are unchanged from previous note.  ALLERGIES:  is allergic to erythromycin and tape.  MEDICATIONS:  Current Outpatient Prescriptions  Medication Sig Dispense Refill  . alendronate (FOSAMAX) 35 MG tablet TAKE 1 TABLET EVERY 7 DAYS WITH A FULL GLASS OF WATER ON AN EMPTY STOMACH. (Patient taking differently: TAKE 1 TABLET BY MOUTH  EVERY 7 DAYS WITH A FULL GLASS OF WATER ON AN EMPTY STOMACH.) 12 tablet 1  . aspirin 81 MG tablet Take 81 mg by mouth daily.      . Calcium Carbonate-Vitamin D (CALCIUM 600+D PO) Take 1 tablet by mouth daily.     . cholecalciferol 2000 UNITS tablet Take 1 capsule daily or as directed (Patient taking differently: Take 1 capsule by mouth daily or as directed) 90 tablet 7  . doxycycline (VIBRAMYCIN) 100 MG capsule     . escitalopram (LEXAPRO) 5 MG tablet Take 5 mg by mouth at bedtime.  6  . furosemide (LASIX) 20 MG tablet Take 20 mg by mouth daily.      Marland Kitchen HIBICLENS 4 % external liquid Apply 1 application topically daily as needed. Wash leg wound once daily  2  . letrozole (FEMARA) 2.5 MG tablet Take 1 tablet (2.5 mg total) by mouth daily.  90 tablet 3  . levothyroxine (SYNTHROID, LEVOTHROID) 50 MCG tablet Take 50 mcg by mouth daily.      . midodrine (PROAMATINE) 2.5 MG tablet TAKE 1 TABLET TWICE A DAY (Patient taking differently: TAKE 1 TABLET BY MOUTH TWICE A DAY) 180 tablet 2  . nitroGLYCERIN (NITRODUR - DOSED IN MG/24 HR) 0.1 mg/hr patch Place 0.1 mg onto the skin daily.   4  . silver sulfADIAZINE (SILVADENE) 1 %  cream Apply 1 application topically daily.   13  . triamcinolone cream (KENALOG) 0.5 % Apply 1 application to affected area twice daily externally 14 days  2  . WELCHOL 625 MG tablet Take 0.5 tablets by mouth daily.  2   No current facility-administered medications for this visit.    PHYSICAL EXAMINATION: ECOG PERFORMANCE STATUS: 1 - Symptomatic but completely ambulatory  Filed Vitals:   01/10/15 0926  BP: 124/63  Pulse: 64  Temp: 98.1 F (36.7 C)  Resp: 19   Filed Weights   01/10/15 0926  Weight: 224 lb 6.4 oz (101.787 kg)    GENERAL:alert, no distress and comfortable SKIN: skin color, texture, turgor are normal, no rashes or significant lesions EYES: normal, Conjunctiva are pink and non-injected, sclera clear OROPHARYNX:no exudate, no erythema and lips, buccal mucosa, and tongue normal  NECK: supple, thyroid normal size, non-tender, without nodularity LYMPH:  no palpable lymphadenopathy in the cervical, axillary or inguinal LUNGS: clear to auscultation and percussion with normal breathing effort HEART: regular rate & rhythm and no murmurs and no lower extremity edema ABDOMEN:abdomen soft, non-tender and normal bowel sounds Musculoskeletal:no cyanosis of digits and no clubbing  NEURO: alert & oriented x 3 with fluent speech, no focal motor/sensory deficits BREAST: No palpable masses or nodules in either right or left breasts. No palpable axillary supraclavicular or infraclavicular adenopathy no breast tenderness or nipple discharge. (exam performed in the presence of a chaperone)  LABORATORY DATA:  I have reviewed the data as listed   Chemistry      Component Value Date/Time   NA 143 01/10/2015 0909   NA 143 04/12/2014 1500   K 4.1 01/10/2015 0909   K 4.4 04/12/2014 1500   CL 99 04/12/2014 1500   CL 106 03/03/2012 0831   CO2 27 01/10/2015 0909   CO2 22 04/12/2014 1500   BUN 23.0 01/10/2015 0909   BUN 28* 04/12/2014 1500   CREATININE 1.1 01/10/2015 0909    CREATININE 1.10 04/12/2014 1500   CREATININE 1.23* 12/22/2012 0858      Component Value Date/Time   CALCIUM 10.0 01/10/2015 0909   CALCIUM 10.7* 04/12/2014 1500   ALKPHOS 89 01/10/2015 0909   ALKPHOS 98 04/12/2014 1500   AST 13 01/10/2015 0909   AST 21 04/12/2014 1500   ALT 12 01/10/2015 0909   ALT 14 04/12/2014 1500   BILITOT 0.37 01/10/2015 0909   BILITOT 0.4 04/12/2014 1500       Lab Results  Component Value Date   WBC 8.4 01/10/2015   HGB 15.2 01/10/2015   HCT 46.5 01/10/2015   MCV 96.3 01/10/2015   PLT 252 01/10/2015   NEUTROABS 6.2 01/10/2015     RADIOGRAPHIC STUDIES: I have personally reviewed the radiology reports and agreed with their findings. No results found.   ASSESSMENT & PLAN:  Bilateral breast cancer Bilateral breast cancers status post bilateral lumpectomies followed by bilateral radiation treatments to the breast and currently on Femara 2.5 mg by mouth daily since 2009. Patient has been tolerating Femara extremely well  without any major problems. She said today walking with a walker. She has not had a mammogram since 2013 but she would like to start back on mammograms again.  We discussed whether discontinuation of Femara is an option but the patient was reluctant to stop it and wanted to continue it Indefinitely.  Breast cancer surveillance: 1. Mammograms 05/11/2014 normal category B breast density 2. Bone density showed osteopenia T score -2.2 2013 3. breast exam 01/10/2015 is normal  Right ankle wound: Healed very well Renal cell cancer: Status post nephrectomy  Return to clinic in 6 months for follow-up with labs      Orders Placed This Encounter  Procedures  . CBC with Differential    Standing Status: Future     Number of Occurrences:      Standing Expiration Date: 01/10/2016  . Comprehensive metabolic panel (Cmet) - [facility]    Standing Status: Future     Number of Occurrences:      Standing Expiration Date: 01/10/2016   The patient  has a good understanding of the overall plan. she agrees with it. she will call with any problems that may develop before the next visit here.   Rulon Eisenmenger, MD

## 2015-01-10 NOTE — Telephone Encounter (Signed)
Gave avs & calendar for March 2017. °

## 2015-01-10 NOTE — Assessment & Plan Note (Addendum)
Bilateral breast cancers status post bilateral lumpectomies followed by bilateral radiation treatments to the breast and currently on Femara 2.5 mg by mouth daily since 2009. Patient has been tolerating Femara extremely well without any major problems. She said today walking with a walker. She has not had a mammogram since 2013 but she would like to start back on mammograms again.  We discussed whether discontinuation of Femara is an option but the patient was reluctant to stop it and wanted to continue it Indefinitely.  Breast cancer surveillance: 1. Mammograms 05/11/2014 normal category B breast density 2. Bone density showed osteopenia T score -2.2 2013 3. breast exam 01/10/2015 is normal  Right ankle wound: Healed very well Renal cell cancer: Status post nephrectomy  Return to clinic in 6 months for follow-up with labs

## 2015-01-17 ENCOUNTER — Encounter: Payer: Self-pay | Admitting: Cardiology

## 2015-01-18 DIAGNOSIS — H43811 Vitreous degeneration, right eye: Secondary | ICD-10-CM | POA: Diagnosis not present

## 2015-01-18 DIAGNOSIS — Z961 Presence of intraocular lens: Secondary | ICD-10-CM | POA: Diagnosis not present

## 2015-01-18 DIAGNOSIS — H40013 Open angle with borderline findings, low risk, bilateral: Secondary | ICD-10-CM | POA: Diagnosis not present

## 2015-01-30 ENCOUNTER — Ambulatory Visit (INDEPENDENT_AMBULATORY_CARE_PROVIDER_SITE_OTHER): Payer: Medicare Other | Admitting: *Deleted

## 2015-01-30 DIAGNOSIS — Z950 Presence of cardiac pacemaker: Secondary | ICD-10-CM

## 2015-01-30 NOTE — Progress Notes (Signed)
Remote pacemaker transmission.   

## 2015-01-31 ENCOUNTER — Telehealth: Payer: Self-pay | Admitting: *Deleted

## 2015-01-31 LAB — CUP PACEART REMOTE DEVICE CHECK
Battery Impedance: 7991 Ohm
Lead Channel Impedance Value: 0 Ohm
Lead Channel Impedance Value: 404 Ohm
MDC IDC MSMT BATTERY REMAINING LONGEVITY: -1 mo
MDC IDC MSMT BATTERY VOLTAGE: 2.57 V
MDC IDC SESS DTM: 20161003111113
MDC IDC SET LEADCHNL RV PACING AMPLITUDE: 2.5 V
MDC IDC SET LEADCHNL RV PACING PULSEWIDTH: 0.4 ms
MDC IDC SET LEADCHNL RV SENSING SENSITIVITY: 2.8 mV
MDC IDC STAT BRADY RV PERCENT PACED: 100 %

## 2015-01-31 NOTE — Telephone Encounter (Signed)
Informed patient that ERI reached on 12/28/14. Patient to f/u w/ JA first available. Will defer scheduling to [facility]. Patient voiced understanding.

## 2015-02-01 ENCOUNTER — Encounter: Payer: Self-pay | Admitting: Internal Medicine

## 2015-02-01 ENCOUNTER — Telehealth: Payer: Self-pay | Admitting: *Deleted

## 2015-02-01 NOTE — Telephone Encounter (Signed)
Informed patient that per Dr.Allred she will not need an oc prior to her gen change since she was last seen in August. Patient voiced understanding.  Nichole Halter, RN to schedule procedure.  Will notify Dannial Monarch, MDT about the need for Tyrex pouch once procedure has been scheduled.

## 2015-02-02 ENCOUNTER — Telehealth: Payer: Self-pay | Admitting: Internal Medicine

## 2015-02-02 NOTE — Telephone Encounter (Signed)
Left a message for patient to return call to schedule generator change out

## 2015-02-02 NOTE — Telephone Encounter (Signed)
New Message       Pt calling stating she needs to talk to [facility] to schedule for Dr. Rayann Heman to change her battery. Please call back and advise after 1:30.

## 2015-02-02 NOTE — Telephone Encounter (Signed)
Patient called back and I have her scheduled for 02/16/15 at 11:30am

## 2015-02-03 ENCOUNTER — Other Ambulatory Visit: Payer: Self-pay | Admitting: *Deleted

## 2015-02-03 DIAGNOSIS — I442 Atrioventricular block, complete: Secondary | ICD-10-CM

## 2015-02-03 DIAGNOSIS — Z45018 Encounter for adjustment and management of other part of cardiac pacemaker: Secondary | ICD-10-CM

## 2015-02-14 ENCOUNTER — Other Ambulatory Visit: Payer: Self-pay | Admitting: Hematology and Oncology

## 2015-02-14 ENCOUNTER — Inpatient Hospital Stay (HOSPITAL_COMMUNITY)
Admission: EM | Admit: 2015-02-14 | Discharge: 2015-02-17 | DRG: 378 | Disposition: A | Discharge status: Home / Self Care | Payer: Medicare Other | Attending: Internal Medicine | Admitting: Internal Medicine

## 2015-02-14 ENCOUNTER — Encounter (HOSPITAL_COMMUNITY): Payer: Self-pay | Admitting: *Deleted

## 2015-02-14 DIAGNOSIS — Z881 Allergy status to other antibiotic agents status: Secondary | ICD-10-CM

## 2015-02-14 DIAGNOSIS — E039 Hypothyroidism, unspecified: Secondary | ICD-10-CM | POA: Diagnosis present

## 2015-02-14 DIAGNOSIS — K921 Melena: Secondary | ICD-10-CM | POA: Diagnosis present

## 2015-02-14 DIAGNOSIS — R197 Diarrhea, unspecified: Secondary | ICD-10-CM | POA: Diagnosis present

## 2015-02-14 DIAGNOSIS — E860 Dehydration: Secondary | ICD-10-CM | POA: Diagnosis present

## 2015-02-14 DIAGNOSIS — K2970 Gastritis, unspecified, without bleeding: Secondary | ICD-10-CM | POA: Diagnosis present

## 2015-02-14 DIAGNOSIS — I442 Atrioventricular block, complete: Secondary | ICD-10-CM | POA: Diagnosis not present

## 2015-02-14 DIAGNOSIS — R7989 Other specified abnormal findings of blood chemistry: Secondary | ICD-10-CM

## 2015-02-14 DIAGNOSIS — N183 Chronic kidney disease, stage 3 (moderate): Secondary | ICD-10-CM | POA: Diagnosis not present

## 2015-02-14 DIAGNOSIS — Z6834 Body mass index (BMI) 34.0-34.9, adult: Secondary | ICD-10-CM

## 2015-02-14 DIAGNOSIS — E6609 Other obesity due to excess calories: Secondary | ICD-10-CM | POA: Diagnosis not present

## 2015-02-14 DIAGNOSIS — I872 Venous insufficiency (chronic) (peripheral): Secondary | ICD-10-CM | POA: Diagnosis present

## 2015-02-14 DIAGNOSIS — Z950 Presence of cardiac pacemaker: Secondary | ICD-10-CM | POA: Diagnosis present

## 2015-02-14 DIAGNOSIS — K648 Other hemorrhoids: Secondary | ICD-10-CM | POA: Diagnosis present

## 2015-02-14 DIAGNOSIS — Z7982 Long term (current) use of aspirin: Secondary | ICD-10-CM

## 2015-02-14 DIAGNOSIS — Z853 Personal history of malignant neoplasm of breast: Secondary | ICD-10-CM

## 2015-02-14 DIAGNOSIS — D125 Benign neoplasm of sigmoid colon: Secondary | ICD-10-CM | POA: Diagnosis present

## 2015-02-14 DIAGNOSIS — I739 Peripheral vascular disease, unspecified: Secondary | ICD-10-CM | POA: Diagnosis present

## 2015-02-14 DIAGNOSIS — Z91048 Other nonmedicinal substance allergy status: Secondary | ICD-10-CM

## 2015-02-14 DIAGNOSIS — N179 Acute kidney failure, unspecified: Secondary | ICD-10-CM | POA: Diagnosis not present

## 2015-02-14 DIAGNOSIS — K5731 Diverticulosis of large intestine without perforation or abscess with bleeding: Secondary | ICD-10-CM | POA: Diagnosis not present

## 2015-02-14 DIAGNOSIS — K922 Gastrointestinal hemorrhage, unspecified: Secondary | ICD-10-CM | POA: Diagnosis present

## 2015-02-14 DIAGNOSIS — R58 Hemorrhage, not elsewhere classified: Secondary | ICD-10-CM | POA: Diagnosis not present

## 2015-02-14 DIAGNOSIS — E7800 Pure hypercholesterolemia, unspecified: Secondary | ICD-10-CM | POA: Diagnosis present

## 2015-02-14 DIAGNOSIS — K625 Hemorrhage of anus and rectum: Secondary | ICD-10-CM | POA: Diagnosis not present

## 2015-02-14 DIAGNOSIS — I129 Hypertensive chronic kidney disease with stage 1 through stage 4 chronic kidney disease, or unspecified chronic kidney disease: Secondary | ICD-10-CM | POA: Diagnosis present

## 2015-02-14 DIAGNOSIS — Z79899 Other long term (current) drug therapy: Secondary | ICD-10-CM

## 2015-02-14 DIAGNOSIS — R1012 Left upper quadrant pain: Secondary | ICD-10-CM | POA: Diagnosis present

## 2015-02-14 DIAGNOSIS — Z45018 Encounter for adjustment and management of other part of cardiac pacemaker: Secondary | ICD-10-CM

## 2015-02-14 DIAGNOSIS — R109 Unspecified abdominal pain: Secondary | ICD-10-CM | POA: Diagnosis present

## 2015-02-14 DIAGNOSIS — D649 Anemia, unspecified: Secondary | ICD-10-CM | POA: Diagnosis present

## 2015-02-14 LAB — BASIC METABOLIC PANEL
Anion gap: 11 (ref 5–15)
BUN: 56 mg/dL — AB (ref 6–20)
CHLORIDE: 101 mmol/L (ref 101–111)
CO2: 25 mmol/L (ref 22–32)
CREATININE: 1.3 mg/dL — AB (ref 0.44–1.00)
Calcium: 9.7 mg/dL (ref 8.9–10.3)
GFR calc non Af Amer: 37 mL/min — ABNORMAL LOW (ref >60)
GFR, EST AFRICAN AMERICAN: 42 mL/min — AB (ref >60)
Glucose, Bld: 124 mg/dL — ABNORMAL HIGH (ref 65–99)
POTASSIUM: 4.4 mmol/L (ref 3.5–5.1)
Sodium: 137 mmol/L (ref 135–145)

## 2015-02-14 LAB — TYPE AND SCREEN
ABO/RH(D): O POS
Antibody Screen: NEGATIVE

## 2015-02-14 LAB — CBC WITH DIFFERENTIAL/PLATELET
Basophils Absolute: 0 10*3/uL (ref 0.0–0.1)
Basophils Relative: 0 %
EOS ABS: 0.5 10*3/uL (ref 0.0–0.7)
Eosinophils Relative: 5 %
HCT: 38.1 % (ref 36.0–46.0)
HEMOGLOBIN: 12.5 g/dL (ref 12.0–15.0)
LYMPHS ABS: 1.8 10*3/uL (ref 0.7–4.0)
LYMPHS PCT: 20 %
MCH: 31.6 pg (ref 26.0–34.0)
MCHC: 32.8 g/dL (ref 30.0–36.0)
MCV: 96.5 fL (ref 78.0–100.0)
MONOS PCT: 8 %
Monocytes Absolute: 0.7 10*3/uL (ref 0.1–1.0)
NEUTROS PCT: 67 %
Neutro Abs: 6 10*3/uL (ref 1.7–7.7)
Platelets: 255 10*3/uL (ref 150–400)
RBC: 3.95 MIL/uL (ref 3.87–5.11)
RDW: 13.1 % (ref 11.5–15.5)
WBC: 8.9 10*3/uL (ref 4.0–10.5)

## 2015-02-14 LAB — I-STAT CG4 LACTIC ACID, ED: LACTIC ACID, VENOUS: 1.86 mmol/L (ref 0.5–2.0)

## 2015-02-14 LAB — POC OCCULT BLOOD, ED: FECAL OCCULT BLD: POSITIVE — AB

## 2015-02-14 NOTE — ED Notes (Signed)
EMS-patient reports several episodes of diarrhea starting Friday, has been taking pepto bismol to relieve symptoms. Patient states that today she noted dark stools. Vitals stable en route.

## 2015-02-14 NOTE — ED Provider Notes (Signed)
CSN: [id number]     Arrival date & time 02/14/15  2114 History   First MD Initiated Contact with Patient 02/14/15 2215     Chief Complaint  Patient presents with  . Diarrhea     (Consider location/radiation/quality/duration/timing/severity/associated sxs/prior Treatment) Patient is a 81 y.o. female presenting with diarrhea. The history is provided by the patient.  Diarrhea Three days ago, she had diarrhea so she took a couple swigs of Pepto-Bismol. The next day, her stools were dark but diarrhea had stopped. She did take another dose of Pepto-Bismol yesterday but called and nurse where her PCP is to advise her not to take anymore Pepto-Bismol. Today, she had blood per rectum. Blood was both bright red and dark red. She then had another bowel movement which is also accommodation of bright red and dark red blood so she came to the ED. She denies nausea or vomiting. She has been having some left upper abdominal pain for several months which is unchanged. She admits to feeling weak but denies feeling dizzy or lightheaded. She does take some low-dose aspirin once a day but is not on any anticoagulants and is not on any NSAIDs. Of note, she does have a pacemaker and is scheduled to have the battery changed in 2 days.  Past Medical History  Diagnosis Date  . Invasive ductal carcinoma of breast ([facility]) 03/2007    BILATERALBREASTS  . Cataract   . Hypercholesterolemia   . Thyroid disease   . Complete heart block (HCC)     s/p PPM implant (MDT) by Dr Blanch Media.  Atrial lead could not be paced at time of the procedure.  She has chronic AV dysociation  . Orthostatic hypotension     treated with midodrine by Dr Rollene [name]  . Exogenous obesity   . Venous insufficiency    Past Surgical History  Procedure Laterality Date  . Abdominal hysterectomy    . Cholecystectomy    . Cataract extraction      EYE SURGERY X 2  . Pacemaker insertion      MDT implanted by Dr Blanch Media.  Atrial lead placement was  unsuccessful.  She has chronic AV dysociation  . Remote right radical nephrectomy  2009  . Bilateral lumpectomies  2009   Family History  Problem Relation Age of Onset  . Heart disease Maternal Grandmother   . Heart disease Mother    Social History  Substance Use Topics  . Smoking status: Never Smoker   . Smokeless tobacco: None  . Alcohol Use: No   OB History    No data available     Review of Systems  Gastrointestinal: Positive for diarrhea.  All other systems reviewed and are negative.     Allergies  Erythromycin and Tape  Home Medications   Prior to Admission medications   Medication Sig Start Date End Date Taking? Authorizing Provider  alendronate (FOSAMAX) 35 MG tablet TAKE 1 TABLET EVERY 7 DAYS WITH A FULL GLASS OF WATER ON AN EMPTY STOMACH. 09/05/14   [name], MD  aspirin 81 MG tablet Take 81 mg by mouth daily.      Historical Provider, MD  bismuth subsalicylate (PEPTO BISMOL) 262 MG/15ML suspension Take 15 mLs by mouth every 6 (six) hours as needed for diarrhea or loose stools.    Historical Provider, MD  Calcium Carbonate-Vitamin D (CALCIUM 600+D PO) Take 1 tablet by mouth daily.     Historical Provider, MD  cholecalciferol 2000 UNITS tablet Take 1 capsule daily or as  directed 09/14/12   Consuela Mimes, MD  doxycycline (VIBRAMYCIN) 100 MG capsule Take 100 mg by mouth daily as needed (for stomach).  12/26/14   Historical Provider, MD  escitalopram (LEXAPRO) 5 MG tablet Take 5 mg by mouth at bedtime.    Historical Provider, MD  furosemide (LASIX) 20 MG tablet Take 20 mg by mouth daily.      Historical Provider, MD  letrozole (FEMARA) 2.5 MG tablet Take 1 tablet (2.5 mg total) by mouth daily. 12/26/14   [name], MD  levothyroxine (SYNTHROID, LEVOTHROID) 50 MCG tablet Take 50 mcg by mouth daily.      Historical Provider, MD  midodrine (PROAMATINE) 2.5 MG tablet TAKE 1 TABLET TWICE A DAY 10/10/14   Thompson Grayer, MD  nitroGLYCERIN (NITRODUR - DOSED IN MG/24 HR) 0.1  mg/hr patch Place 0.1 mg onto the skin daily.  06/14/14   Historical Provider, MD  silver sulfADIAZINE (SILVADENE) 1 % cream Apply 1 application topically daily.  04/19/14   Historical Provider, MD  WELCHOL 625 MG tablet Take 0.5 tablets by mouth daily. 01/13/14   Historical Provider, MD   BP 96/69 mmHg  Pulse 65  Temp(Src) 97.7 F (36.5 C) (Oral)  Resp 16  SpO2 95% Physical Exam  Nursing note and vitals reviewed.  81 year old female, resting comfortably and in no acute distress. Vital signs are normal. Oxygen saturation is 95%, which is normal. Head is normocephalic and atraumatic. PERRLA, EOMI. Oropharynx is clear. Conjunctivae are slightly pale. Neck is nontender and supple without adenopathy or JVD. Back is nontender and there is no CVA tenderness. Lungs are clear without rales, wheezes, or rhonchi. Chest is nontender. Heart has regular rate and rhythm without murmur. Abdomen is soft, flat, nontender without masses or hepatosplenomegaly and peristalsis is normoactive. Rectal: Slightly decreased sphincter tone. Stool  Is dark red/maroon and strongly hemoccult positive. Extremities have no cyanosis or edema, full range of motion is present. Skin is warm and dry without rash. Neurologic: Mental status is normal, cranial nerves are intact, there are no motor or sensory deficits.  ED Course  Procedures (including critical care time) Labs Review Results for orders placed or performed during the hospital encounter of 89/38/10  Basic metabolic panel  Result Value Ref Range   Sodium 137 135 - 145 mmol/L   Potassium 4.4 3.5 - 5.1 mmol/L   Chloride 101 101 - 111 mmol/L   CO2 25 22 - 32 mmol/L   Glucose, Bld 124 (H) 65 - 99 mg/dL   BUN 56 (H) 6 - 20 mg/dL   Creatinine, Ser 1.30 (H) 0.44 - 1.00 mg/dL   Calcium 9.7 8.9 - 10.3 mg/dL   GFR calc non Af Amer 37 (L) >60 mL/min   GFR calc Af Amer 42 (L) >60 mL/min   Anion gap 11 5 - 15  CBC with Differential  Result Value Ref Range   WBC  8.9 4.0 - 10.5 K/uL   RBC 3.95 3.87 - 5.11 MIL/uL   Hemoglobin 12.5 12.0 - 15.0 g/dL   HCT 38.1 36.0 - 46.0 %   MCV 96.5 78.0 - 100.0 fL   MCH 31.6 26.0 - 34.0 pg   MCHC 32.8 30.0 - 36.0 g/dL   RDW 13.1 11.5 - 15.5 %   Platelets 255 150 - 400 K/uL   Neutrophils Relative % 67 %   Neutro Abs 6.0 1.7 - 7.7 K/uL   Lymphocytes Relative 20 %   Lymphs Abs 1.8 0.7 - 4.0 K/uL  Monocytes Relative 8 %   Monocytes Absolute 0.7 0.1 - 1.0 K/uL   Eosinophils Relative 5 %   Eosinophils Absolute 0.5 0.0 - 0.7 K/uL   Basophils Relative 0 %   Basophils Absolute 0.0 0.0 - 0.1 K/uL  POC occult blood, ED Provider will collect  Result Value Ref Range   Fecal Occult Bld POSITIVE (A) NEGATIVE  I-Stat CG4 Lactic Acid, ED  Result Value Ref Range   Lactic Acid, Venous 1.86 0.5 - 2.0 mmol/L  Type and screen [place]  Result Value Ref Range   ABO/RH(D) O POS    Antibody Screen NEG    Sample Expiration 02/17/2015   ABO/Rh  Result Value Ref Range   ABO/RH(D) O POS    I have personally reviewed and evaluated these lab results as part of my medical decision-making.  MDM   Final diagnoses:  Gastrointestinal hemorrhage, unspecified gastritis, unspecified gastrointestinal hemorrhage type  Acute kidney injury (nontraumatic) (HCC)  Prerenal azotemia    GI bleed which is most likely lower GI bleed. Statistically, this is most likely diverticulosis. She will need to be admitted for monitoring her hemoglobin.  Orthostatic vital signs showed no drop in blood pressure or rise in pulse, but she is pacemaker dependent. Blood pressure lying is fairly low at 96/69. Lactic acid level has come back normal. Hemoglobin is normal at the 12.5, but still nearly a 3 g drop from baseline from one month ago. BUN is significantly elevated relative to creatinine suggesting possible upper GI bleed. She will be started on pantoprazole. Case is discussed with Dr. Hal Hope of [facility] who agrees to  admit the patient. Given her borderline blood pressure, she will be started off in stepdown unit.    Delora Fuel, MD 56/86/16 [id number]

## 2015-02-15 ENCOUNTER — Encounter (HOSPITAL_COMMUNITY): Payer: Self-pay | Admitting: Internal Medicine

## 2015-02-15 ENCOUNTER — Inpatient Hospital Stay (HOSPITAL_COMMUNITY): Payer: Medicare Other

## 2015-02-15 DIAGNOSIS — Z6834 Body mass index (BMI) 34.0-34.9, adult: Secondary | ICD-10-CM | POA: Diagnosis not present

## 2015-02-15 DIAGNOSIS — I739 Peripheral vascular disease, unspecified: Secondary | ICD-10-CM | POA: Diagnosis present

## 2015-02-15 DIAGNOSIS — Z79899 Other long term (current) drug therapy: Secondary | ICD-10-CM | POA: Diagnosis not present

## 2015-02-15 DIAGNOSIS — D126 Benign neoplasm of colon, unspecified: Secondary | ICD-10-CM | POA: Diagnosis not present

## 2015-02-15 DIAGNOSIS — N183 Chronic kidney disease, stage 3 (moderate): Secondary | ICD-10-CM | POA: Diagnosis present

## 2015-02-15 DIAGNOSIS — R1012 Left upper quadrant pain: Secondary | ICD-10-CM | POA: Diagnosis present

## 2015-02-15 DIAGNOSIS — R197 Diarrhea, unspecified: Secondary | ICD-10-CM | POA: Diagnosis present

## 2015-02-15 DIAGNOSIS — K5731 Diverticulosis of large intestine without perforation or abscess with bleeding: Secondary | ICD-10-CM | POA: Diagnosis present

## 2015-02-15 DIAGNOSIS — K921 Melena: Secondary | ICD-10-CM | POA: Diagnosis present

## 2015-02-15 DIAGNOSIS — K648 Other hemorrhoids: Secondary | ICD-10-CM | POA: Diagnosis present

## 2015-02-15 DIAGNOSIS — Z91048 Other nonmedicinal substance allergy status: Secondary | ICD-10-CM | POA: Diagnosis not present

## 2015-02-15 DIAGNOSIS — Z853 Personal history of malignant neoplasm of breast: Secondary | ICD-10-CM | POA: Diagnosis not present

## 2015-02-15 DIAGNOSIS — D649 Anemia, unspecified: Secondary | ICD-10-CM | POA: Diagnosis present

## 2015-02-15 DIAGNOSIS — Z881 Allergy status to other antibiotic agents status: Secondary | ICD-10-CM | POA: Diagnosis not present

## 2015-02-15 DIAGNOSIS — Z950 Presence of cardiac pacemaker: Secondary | ICD-10-CM

## 2015-02-15 DIAGNOSIS — K5790 Diverticulosis of intestine, part unspecified, without perforation or abscess without bleeding: Secondary | ICD-10-CM | POA: Diagnosis not present

## 2015-02-15 DIAGNOSIS — E039 Hypothyroidism, unspecified: Secondary | ICD-10-CM | POA: Diagnosis present

## 2015-02-15 DIAGNOSIS — R109 Unspecified abdominal pain: Secondary | ICD-10-CM | POA: Diagnosis present

## 2015-02-15 DIAGNOSIS — N179 Acute kidney failure, unspecified: Secondary | ICD-10-CM | POA: Diagnosis not present

## 2015-02-15 DIAGNOSIS — K922 Gastrointestinal hemorrhage, unspecified: Secondary | ICD-10-CM | POA: Diagnosis present

## 2015-02-15 DIAGNOSIS — K635 Polyp of colon: Secondary | ICD-10-CM | POA: Diagnosis not present

## 2015-02-15 DIAGNOSIS — K625 Hemorrhage of anus and rectum: Secondary | ICD-10-CM | POA: Diagnosis not present

## 2015-02-15 DIAGNOSIS — I129 Hypertensive chronic kidney disease with stage 1 through stage 4 chronic kidney disease, or unspecified chronic kidney disease: Secondary | ICD-10-CM | POA: Diagnosis present

## 2015-02-15 DIAGNOSIS — I872 Venous insufficiency (chronic) (peripheral): Secondary | ICD-10-CM | POA: Diagnosis present

## 2015-02-15 DIAGNOSIS — E6609 Other obesity due to excess calories: Secondary | ICD-10-CM | POA: Diagnosis present

## 2015-02-15 DIAGNOSIS — I442 Atrioventricular block, complete: Secondary | ICD-10-CM | POA: Diagnosis not present

## 2015-02-15 DIAGNOSIS — D125 Benign neoplasm of sigmoid colon: Secondary | ICD-10-CM | POA: Diagnosis not present

## 2015-02-15 DIAGNOSIS — E860 Dehydration: Secondary | ICD-10-CM | POA: Diagnosis present

## 2015-02-15 DIAGNOSIS — Z7982 Long term (current) use of aspirin: Secondary | ICD-10-CM | POA: Diagnosis not present

## 2015-02-15 DIAGNOSIS — E7800 Pure hypercholesterolemia, unspecified: Secondary | ICD-10-CM | POA: Diagnosis present

## 2015-02-15 DIAGNOSIS — K2970 Gastritis, unspecified, without bleeding: Secondary | ICD-10-CM | POA: Diagnosis present

## 2015-02-15 LAB — COMPREHENSIVE METABOLIC PANEL
ALBUMIN: 3.1 g/dL — AB (ref 3.5–5.0)
ALT: 11 U/L — ABNORMAL LOW (ref 14–54)
ANION GAP: 8 (ref 5–15)
AST: 16 U/L (ref 15–41)
Alkaline Phosphatase: 52 U/L (ref 38–126)
BUN: 50 mg/dL — ABNORMAL HIGH (ref 6–20)
CALCIUM: 8.8 mg/dL — AB (ref 8.9–10.3)
CHLORIDE: 105 mmol/L (ref 101–111)
CO2: 26 mmol/L (ref 22–32)
Creatinine, Ser: 1.15 mg/dL — ABNORMAL HIGH (ref 0.44–1.00)
GFR calc non Af Amer: 42 mL/min — ABNORMAL LOW (ref >60)
GFR, EST AFRICAN AMERICAN: 49 mL/min — AB (ref >60)
GLUCOSE: 107 mg/dL — AB (ref 65–99)
POTASSIUM: 4 mmol/L (ref 3.5–5.1)
SODIUM: 139 mmol/L (ref 135–145)
Total Bilirubin: 0.7 mg/dL (ref 0.3–1.2)
Total Protein: 5.5 g/dL — ABNORMAL LOW (ref 6.5–8.1)

## 2015-02-15 LAB — CBC
HCT: 36.3 % (ref 36.0–46.0)
HCT: 34.4 % — ABNORMAL LOW (ref 36.0–46.0)
HEMATOCRIT: 32.1 % — AB (ref 36.0–46.0)
HEMOGLOBIN: 11.7 g/dL — AB (ref 12.0–15.0)
HEMOGLOBIN: 11.3 g/dL — AB (ref 12.0–15.0)
Hemoglobin: 10.5 g/dL — ABNORMAL LOW (ref 12.0–15.0)
MCH: 31.1 pg (ref 26.0–34.0)
MCH: 31.9 pg (ref 26.0–34.0)
MCH: 32 pg (ref 26.0–34.0)
MCHC: 32.2 g/dL (ref 30.0–36.0)
MCHC: 32.7 g/dL (ref 30.0–36.0)
MCHC: 32.8 g/dL (ref 30.0–36.0)
MCV: 96.5 fL (ref 78.0–100.0)
MCV: 97.6 fL (ref 78.0–100.0)
MCV: 97.5 fL (ref 78.0–100.0)
PLATELETS: 234 10*3/uL (ref 150–400)
PLATELETS: 213 10*3/uL (ref 150–400)
PLATELETS: 221 10*3/uL (ref 150–400)
RBC: 3.76 MIL/uL — AB (ref 3.87–5.11)
RBC: 3.29 MIL/uL — ABNORMAL LOW (ref 3.87–5.11)
RBC: 3.53 MIL/uL — ABNORMAL LOW (ref 3.87–5.11)
RDW: 13.1 % (ref 11.5–15.5)
RDW: 13.3 % (ref 11.5–15.5)
RDW: 13.4 % (ref 11.5–15.5)
WBC: 9.4 10*3/uL (ref 4.0–10.5)
WBC: 8 10*3/uL (ref 4.0–10.5)
WBC: 8.3 10*3/uL (ref 4.0–10.5)

## 2015-02-15 LAB — ABO/RH: ABO/RH(D): O POS

## 2015-02-15 LAB — MRSA PCR SCREENING: MRSA by PCR: NEGATIVE

## 2015-02-15 LAB — GLUCOSE, CAPILLARY
GLUCOSE-CAPILLARY: 113 mg/dL — AB (ref 65–99)
GLUCOSE-CAPILLARY: 84 mg/dL (ref 65–99)
GLUCOSE-CAPILLARY: 126 mg/dL — AB (ref 65–99)

## 2015-02-15 MED ORDER — ACETAMINOPHEN 325 MG PO TABS: 650.0000 mg | ORAL_TABLET | Freq: Four times a day (QID) | ORAL | Status: DC | PRN

## 2015-02-15 MED ORDER — ONDANSETRON HCL 4 MG/2ML IJ SOLN: 4.0000 mg | Freq: Four times a day (QID) | INTRAMUSCULAR | Status: DC | PRN

## 2015-02-15 MED ORDER — IOHEXOL 300 MG/ML  SOLN
80.0000 mL | Freq: Once | INTRAMUSCULAR | Status: AC | PRN
  Administered 2015-02-15: 80 mL via INTRAVENOUS

## 2015-02-15 MED ORDER — SODIUM CHLORIDE 0.9 % IV SOLN
INTRAVENOUS | Status: DC
  Administered 2015-02-15 – 2015-02-16 (×2): via INTRAVENOUS

## 2015-02-15 MED ORDER — SODIUM CHLORIDE 0.9 % IV SOLN: INTRAVENOUS | Status: AC

## 2015-02-15 MED ORDER — LEVOTHYROXINE SODIUM 100 MCG IV SOLR
25.0000 ug | Freq: Every day | INTRAVENOUS | Status: DC
  Administered 2015-02-15 – 2015-02-17 (×3): 25 ug via INTRAVENOUS
  Filled 2015-02-15 (×3): qty 5

## 2015-02-15 MED ORDER — IOHEXOL 300 MG/ML  SOLN
25.0000 mL | INTRAMUSCULAR | Status: AC
  Administered 2015-02-15 (×2): 25 mL via ORAL

## 2015-02-15 MED ORDER — ACETAMINOPHEN 650 MG RE SUPP: 650.0000 mg | Freq: Four times a day (QID) | RECTAL | Status: DC | PRN

## 2015-02-15 MED ORDER — NITROGLYCERIN 0.1 MG/HR TD PT24
0.1000 mg | MEDICATED_PATCH | Freq: Every day | TRANSDERMAL | Status: DC
  Administered 2015-02-15 – 2015-02-17 (×3): 0.1 mg via TRANSDERMAL
  Filled 2015-02-15 (×3): qty 1

## 2015-02-15 MED ORDER — SODIUM CHLORIDE 0.9 % IR SOLN
80.0000 mg | Status: DC
  Filled 2015-02-15: qty 2

## 2015-02-15 MED ORDER — SODIUM CHLORIDE 0.9 % IV SOLN
INTRAVENOUS | Status: DC
  Administered 2015-02-15: 01:00:00 via INTRAVENOUS

## 2015-02-15 MED ORDER — SODIUM CHLORIDE 0.9 % IV SOLN
80.0000 mg | Freq: Once | INTRAVENOUS | Status: AC
  Administered 2015-02-15: 80 mg via INTRAVENOUS
  Filled 2015-02-15: qty 80

## 2015-02-15 MED ORDER — ONDANSETRON HCL 4 MG PO TABS: 4.0000 mg | ORAL_TABLET | Freq: Four times a day (QID) | ORAL | Status: DC | PRN

## 2015-02-15 MED ORDER — CETYLPYRIDINIUM CHLORIDE 0.05 % MT LIQD
7.0000 mL | Freq: Two times a day (BID) | OROMUCOSAL | Status: DC
  Administered 2015-02-15 – 2015-02-17 (×4): 7 mL via OROMUCOSAL

## 2015-02-15 MED ORDER — PANTOPRAZOLE SODIUM 40 MG IV SOLR: 40.0000 mg | Freq: Two times a day (BID) | INTRAVENOUS | Status: DC

## 2015-02-15 MED ORDER — INFLUENZA VAC SPLIT QUAD 0.5 ML IM SUSY
0.5000 mL | PREFILLED_SYRINGE | INTRAMUSCULAR | Status: AC
  Administered 2015-02-16: 0.5 mL via INTRAMUSCULAR
  Filled 2015-02-15: qty 0.5

## 2015-02-15 MED ORDER — PANTOPRAZOLE SODIUM 40 MG IV SOLR
8.0000 mg/h | INTRAVENOUS | Status: DC
  Administered 2015-02-15 (×2): 8 mg/h via INTRAVENOUS
  Filled 2015-02-15 (×8): qty 80

## 2015-02-15 MED ORDER — PEG 3350-KCL-NA BICARB-NACL 420 G PO SOLR
4000.0000 mL | Freq: Once | ORAL | Status: AC
  Administered 2015-02-15: 4000 mL via ORAL
  Filled 2015-02-15: qty 4000

## 2015-02-15 NOTE — Telephone Encounter (Signed)
Chart reviewed.

## 2015-02-15 NOTE — Progress Notes (Signed)
Patient admitted after midnight, please see H&P.  CT Scan pending.  GI plans to scope in AM.  EP also plans generator replacement for PPM before d/c. Eulogio Bear DO

## 2015-02-15 NOTE — Consult Note (Signed)
Referring Provider: Dr. Eliseo Squires Primary Care Physician:  [name], MD Primary Gastroenterologist:  Althia Forts  Reason for Consultation:  GI bleed; Heme positive stool  HPI: [name] is a 81 y.o. female with multiple medical problems who had the acute onset of nonbloody diarrhea this past Saturday that occurred 7 times. She took 2 doses of Pepto-Bismol and felt ok Sunday and then on Monday developed black stools. She took another dose of Pepto-Bismol and then had red and black stool several times on Tuesday. Has been having intermittent dull LUQ pain for the past 3-4 months and denies any worsening of this pain over the weekend. Felt weak since onset of the diarrhea with a little dizziness. Denies N/V/GERD/dysphagia/weight loss. Has been belching intermittently for the past 6 months that is new for her. Takes an 81 mg Aspirin but denies other NSAIDs. No BMs last night or today per nursing. Reports a normal colonoscopy 5 years ago by Dr. Earlean Shawl. No history of ulcers. She has a pacemaker for complete heart block.    Past Medical History  Diagnosis Date  . Invasive ductal carcinoma of breast ([facility]) 03/2007    BILATERALBREASTS  . Cataract   . Hypercholesterolemia   . Thyroid disease   . Complete heart block (HCC)     s/p PPM implant (MDT) by Dr Blanch Media.  Atrial lead could not be paced at time of the procedure.  She has chronic AV dysociation  . Orthostatic hypotension     treated with midodrine by Dr Rollene [name]  . Exogenous obesity   . Venous insufficiency     Past Surgical History  Procedure Laterality Date  . Abdominal hysterectomy    . Cholecystectomy    . Cataract extraction      EYE SURGERY X 2  . Pacemaker insertion      MDT implanted by Dr Blanch Media.  Atrial lead placement was unsuccessful.  She has chronic AV dysociation  . Remote right radical nephrectomy  2009  . Bilateral lumpectomies  2009    Prior to Admission medications   Medication Sig Start Date End Date Taking?  Authorizing Provider  alendronate (FOSAMAX) 35 MG tablet TAKE 1 TABLET EVERY 7 DAYS WITH A FULL GLASS OF WATER ON AN EMPTY STOMACH 02/15/15   [name], MD  aspirin 81 MG tablet Take 81 mg by mouth daily.      Historical Provider, MD  bismuth subsalicylate (PEPTO BISMOL) 262 MG/15ML suspension Take 15 mLs by mouth every 6 (six) hours as needed for diarrhea or loose stools.    Historical Provider, MD  Calcium Carbonate-Vitamin D (CALCIUM 600+D PO) Take 1 tablet by mouth daily.     Historical Provider, MD  cholecalciferol 2000 UNITS tablet Take 1 capsule daily or as directed 09/14/12   Consuela Mimes, MD  doxycycline (VIBRAMYCIN) 100 MG capsule Take 100 mg by mouth daily as needed (for stomach).  12/26/14   Historical Provider, MD  escitalopram (LEXAPRO) 5 MG tablet Take 5 mg by mouth at bedtime.    Historical Provider, MD  furosemide (LASIX) 20 MG tablet Take 20 mg by mouth daily.      Historical Provider, MD  letrozole (FEMARA) 2.5 MG tablet Take 1 tablet (2.5 mg total) by mouth daily. 12/26/14   [name], MD  levothyroxine (SYNTHROID, LEVOTHROID) 50 MCG tablet Take 50 mcg by mouth daily.      Historical Provider, MD  midodrine (PROAMATINE) 2.5 MG tablet TAKE 1 TABLET TWICE A DAY 10/10/14   Thompson Grayer,  MD  nitroGLYCERIN (NITRODUR - DOSED IN MG/24 HR) 0.1 mg/hr patch Place 0.1 mg onto the skin daily.  06/14/14   Historical Provider, MD  silver sulfADIAZINE (SILVADENE) 1 % cream Apply 1 application topically daily.  04/19/14   Historical Provider, MD  WELCHOL 625 MG tablet Take 0.5 tablets by mouth daily. 01/13/14   Historical Provider, MD    Scheduled Meds: . antiseptic oral rinse  7 mL Mouth Rinse BID  . [START ON 02/16/2015] Influenza vac split quadrivalent PF  0.5 mL Intramuscular Tomorrow-1000  . levothyroxine  25 mcg Intravenous Daily  . nitroGLYCERIN  0.1 mg Transdermal Daily  . [START ON 02/18/2015] pantoprazole (PROTONIX) IV  40 mg Intravenous Q12H   Continuous Infusions: . sodium  chloride 100 mL/hr at 02/15/15 0145  . pantoprozole (PROTONIX) infusion 8 mg/hr (02/15/15 0131)   PRN Meds:.acetaminophen **OR** acetaminophen, ondansetron **OR** ondansetron (ZOFRAN) IV  Allergies as of 02/14/2015 - Review Complete 02/14/2015  Allergen Reaction Noted  . Erythromycin Hives 02/01/2011  . Tape  02/01/2011    Family History  Problem Relation Age of Onset  . Heart disease Maternal Grandmother   . Heart disease Mother     Social History   Social History  . Marital Status: Married    Spouse Name: N/A  . Number of Children: N/A  . Years of Education: N/A   Occupational History  . Not on file.   Social History Main Topics  . Smoking status: Never Smoker   . Smokeless tobacco: Not on file  . Alcohol Use: No  . Drug Use: No  . Sexual Activity: Not Currently   Other Topics Concern  . Not on file   Social History Narrative    Review of Systems: All negative except as stated above in HPI.  Physical Exam: Vital signs: Filed Vitals:   02/15/15 0800  BP: 108/94  Pulse: 65  Temp: 97.9  Resp: 15   Last BM Date: 02/15/15 General:  Elderly, alert,  well-developed, well-nourished, pleasant and cooperative in NAD Head: atraumatic Eyes: anicteric ENT: oropharynx clear, mouth, nose normal Neck: supple, nontender Lungs:  Clear throughout to auscultation.   No wheezes, crackles, or rhonchi. No acute distress. Heart:  Regular rate and rhythm; no murmurs, clicks, rubs,  or gallops. Abdomen: minimal LUQ tenderness with guarding, soft, nondistended, +BS  Rectal:  Deferred Ext: pedal pulses intact, no edema Skin: no rash  GI:  Lab Results:  Recent Labs  02/15/15 0153 02/15/15 0520 02/15/15 0927  WBC 9.4 8.0 8.3  HGB 11.7* 10.5* 11.3*  HCT 36.3 32.1* 34.4*  PLT 234 213 221   BMET  Recent Labs  02/14/15 2300 02/15/15 0520  NA 137 139  K 4.4 4.0  CL 101 105  CO2 25 26  GLUCOSE 124* 107*  BUN 56* 50*  CREATININE 1.30* 1.15*  CALCIUM 9.7 8.8*    LFT  Recent Labs  02/15/15 0520  PROT 5.5*  ALBUMIN 3.1*  AST 16  ALT 11*  ALKPHOS 52  BILITOT 0.7   PT/INR No results for input(s): LABPROT, INR in the last 72 hours.   Studies/Results: No results found.  Impression/Plan: 81 yo with a presentation concerning for a GI bleed however I think her black stools are likely due to her recent Pepto-Bismol use. The red blood in her stool and heme positive stool could be due to hemorrhoids from a rectal outlet source from the diarrhea. That being said she is mildly anemic and I would recommend an updated  colonoscopy due to her abdominal pain and rectal bleeding. Doubt an upper tract source but due to her need for outpt aspirin from a cardiac standpoint I will do an EGD in addition to the colonoscopy that is planned for tomorrow. Abd/pelvis CT planned by primary team. Clears after CT and then NPO p MN. Colon prep to start this afternoon.    LOS: 0 days   [place] C.  02/15/2015, 10:06 AM  Pager [phone]  If no answer or after 5 PM call [phone]

## 2015-02-15 NOTE — Consult Note (Signed)
ELECTROPHYSIOLOGY CONSULT NOTE    Patient ID: [name] AUTH MRN: [id number], DOB/AGE: [id number] 79 y.o.  Admit date: 02/14/2015 Date of Consult: 02/15/2015  Primary Physician: [name], MD Electrophysiologist: Allred  Reason for Consultation: PPM at [facility]  HPI:  [name] is a 81 y.o. female with a past medical history significnat for hyperlipidemia, hypothyroidism, orthostatic hypotension, obesity, and complete heart block s/p PPM implant.  At time of PPM implant, atrial lead was unable to be placed and she has done well with VVIR pacemaker system. Her device reached [facility] 12-28-14 and she was scheduled for generator change 02-16-15. She presented this morning with rectal bleeding and abdominal pain. GI consult is pending.  EP has been asked to evaluate for timing of pacemaker generator change.   She currently denies chest pain, shortness of breath, LE edema, recent fevers or chills.   Past Medical History  Diagnosis Date  . Invasive ductal carcinoma of breast ([place]) 03/2007    BILATERALBREASTS  . Cataract   . Hypercholesterolemia   . Thyroid disease   . Complete heart block (HCC)     s/p PPM implant (MDT) by Dr Blanch Media.  Atrial lead could not be paced at time of the procedure.  She has chronic AV dysociation  . Orthostatic hypotension     treated with midodrine by Dr Rollene [name]  . Exogenous obesity   . Venous insufficiency      Surgical History:  Past Surgical History  Procedure Laterality Date  . Abdominal hysterectomy    . Cholecystectomy    . Cataract extraction      EYE SURGERY X 2  . Pacemaker insertion      MDT implanted by Dr Blanch Media.  Atrial lead placement was unsuccessful.  She has chronic AV dysociation  . Remote right radical nephrectomy  2009  . Bilateral lumpectomies  2009     Prescriptions prior to admission  Medication Sig Dispense Refill Last Dose  . alendronate (FOSAMAX) 35 MG tablet TAKE 1 TABLET EVERY 7 DAYS WITH A FULL GLASS OF WATER ON  AN EMPTY STOMACH. 12 tablet 1 Taking  . aspirin 81 MG tablet Take 81 mg by mouth daily.     Taking  . bismuth subsalicylate (PEPTO BISMOL) 262 MG/15ML suspension Take 15 mLs by mouth every 6 (six) hours as needed for diarrhea or loose stools.     . Calcium Carbonate-Vitamin D (CALCIUM 600+D PO) Take 1 tablet by mouth daily.    Taking  . cholecalciferol 2000 UNITS tablet Take 1 capsule daily or as directed 90 tablet 7 Taking  . doxycycline (VIBRAMYCIN) 100 MG capsule Take 100 mg by mouth daily as needed (for stomach).   2   . escitalopram (LEXAPRO) 5 MG tablet Take 5 mg by mouth at bedtime.     . furosemide (LASIX) 20 MG tablet Take 20 mg by mouth daily.     Taking  . letrozole (FEMARA) 2.5 MG tablet Take 1 tablet (2.5 mg total) by mouth daily. 90 tablet 3 Taking  . levothyroxine (SYNTHROID, LEVOTHROID) 50 MCG tablet Take 50 mcg by mouth daily.     Taking  . midodrine (PROAMATINE) 2.5 MG tablet TAKE 1 TABLET TWICE A DAY 180 tablet 2 Taking  . nitroGLYCERIN (NITRODUR - DOSED IN MG/24 HR) 0.1 mg/hr patch Place 0.1 mg onto the skin daily.   4 Taking  . silver sulfADIAZINE (SILVADENE) 1 % cream Apply 1 application topically daily.   13 Taking  . WELCHOL 625 MG  tablet Take 0.5 tablets by mouth daily.  2 Taking    Inpatient Medications:  . antiseptic oral rinse  7 mL Mouth Rinse BID  . [START ON 02/16/2015] Influenza vac split quadrivalent PF  0.5 mL Intramuscular Tomorrow-1000  . levothyroxine  25 mcg Intravenous Daily  . nitroGLYCERIN  0.1 mg Transdermal Daily  . [START ON 02/18/2015] pantoprazole (PROTONIX) IV  40 mg Intravenous Q12H    Allergies:  Allergies  Allergen Reactions  . Erythromycin Hives  . Tape     BAND AIDS-SKIN IRRITATION    Social History   Social History  . Marital Status: Married    Spouse Name: N/A  . Number of Children: N/A  . Years of Education: N/A   Occupational History  . Not on file.   Social History Main Topics  . Smoking status: Never Smoker   .  Smokeless tobacco: Not on file  . Alcohol Use: No  . Drug Use: No  . Sexual Activity: Not Currently   Other Topics Concern  . Not on file   Social History Narrative     Family History  Problem Relation Age of Onset  . Heart disease Maternal Grandmother   . Heart disease Mother      Review of Systems: All other systems reviewed and are otherwise negative except as noted above.  Physical Exam: Filed Vitals:   02/15/15 0500 02/15/15 0600 02/15/15 0700 02/15/15 0800  BP: 94/70 97/43 108/94   Pulse: 64 65 65   Temp:      TempSrc:      Resp: 19 16 17 15   Height:      Weight:      SpO2:        GEN- The patient is elderly and obese appearing, alert and oriented x 3 today.   HEENT: normocephalic, atraumatic; sclera clear, conjunctiva pink; hearing intact; oropharynx clear; neck supple  Lungs- Clear to ausculation bilaterally, normal work of breathing.  No wheezes, rales, rhonchi Heart- Regular rate and rhythm (paced) GI- soft, non-tender, non-distended, bowel sounds present  Extremities- no clubbing, cyanosis, or edema; DP/PT/radial pulses 2+ bilaterally MS- no significant deformity or atrophy Skin- warm and dry, no rash or lesion Psych- euthymic mood, full affect Neuro- strength and sensation are intact  Labs:   Lab Results  Component Value Date   WBC 8.0 02/15/2015   HGB 10.5* 02/15/2015   HCT 32.1* 02/15/2015   MCV 97.6 02/15/2015   PLT 213 02/15/2015    Recent Labs Lab 02/15/15 0520  NA 139  K 4.0  CL 105  CO2 26  BUN 50*  CREATININE 1.15*  CALCIUM 8.8*  PROT 5.5*  BILITOT 0.7  ALKPHOS 52  ALT 11*  AST 16  GLUCOSE 107*      Radiology/Studies: No results found.   TELEMETRY: V paced at 65, AV dissociation  Assessment/Plan: 1.  Complete heart block S/p PPM - now at [facility] and reverted to VVI 65 (was programmed VVIR 70) Atrial lead unable to be placed at time of original implant.  She and Dr Rayann Heman had a discussion in the office about attempting to  place atrial lead at time of gen change with sinus rhythm, but she was clear in her decision to have that done. She continues to still just want generator replaced.  She does have some tethering over the device and a discussion was also had about concern for infection.  She is aware of these concerns. Dorman Calderwood plan to use antibiotic pouch at  time of generator change.   With acute GI bleed, Dafna Romo postpone gen change for now.  Could do day before or of discharge if she is medically stable. From a device standpoint, we have another 6 weeks before gen change is required.   2.  GI bleed Management per primary team  Signed, Chanetta Marshall, NP 02/15/2015 8:59 AM  I have seen and examined this patient with Chanetta Marshall.  Agree with above, note added to reflect my findings.  On exam, regular rhythm, no murmurs, lungs clear.  Had GI bleed being currently treated.  Needs generator change.  Shakara Tweedy see what results from Gi workup are prior to generator change.  May be able to do Friday.    Deshauna Cayson M. Mohamud Mrozek MD 02/16/2015 9:22 AM

## 2015-02-15 NOTE — ED Notes (Signed)
Gave PT ice water, per Janett Billow- RN

## 2015-02-15 NOTE — Progress Notes (Signed)
Utilization Review Completed.  

## 2015-02-15 NOTE — H&P (Signed)
[facility] History and Physical  JENALYN GIRDNER QPY:[id number] DOB: July 28, 1927 DOA: 02/14/2015  Referring physician: Dr. Roxanne Mins. PCP: [name], MD  Specialists: Dr. Rayann Heman. Cardiologist.  Chief Complaint: Rectal bleeding.  HPI: [name] is a 81 y.o. female with history of hypothyroidism, complete heart block status post pacemaker placement, breast cancer in remission presents to the ER because of rectal bleeding. Patient states over the last 24 hours patient has had 6-7 episodes of rectal bleeding. Initially it was dark black in color later became more frankly bloody. Patient states over the last 2 months patient has been experiencing left upper quadrant pain which has become more constant. Patient has been trying some Pepto-Bismol. Patient also tried some doxycycline last 3 days on the first day after trying patient had at least 7 episodes of diarrhea. Patient denies any chest pain shortness of breath dizziness or loss of consciousness. Denies any nausea vomiting. Patient states she has had a colonoscopy 5 years ago which was unremarkable. Patient does not recall her gastroenterologist name. Patient is also due for her pacemaker battery changed 2 days from now. In the ER stool for occult blood was positive with dark stools. Patient denies taking any NSAIDs though patient is on aspirin.   Review of Systems: As presented in the history of presenting illness, rest negative.  Past Medical History  Diagnosis Date  . Invasive ductal carcinoma of breast ([place]) 03/2007    BILATERALBREASTS  . Cataract   . Hypercholesterolemia   . Thyroid disease   . Complete heart block (HCC)     s/p PPM implant (MDT) by Dr Blanch Media.  Atrial lead could not be paced at time of the procedure.  She has chronic AV dysociation  . Orthostatic hypotension     treated with midodrine by Dr Rollene [name]  . Exogenous obesity   . Venous insufficiency    Past Surgical History  Procedure Laterality Date  .  Abdominal hysterectomy    . Cholecystectomy    . Cataract extraction      EYE SURGERY X 2  . Pacemaker insertion      MDT implanted by Dr Blanch Media.  Atrial lead placement was unsuccessful.  She has chronic AV dysociation  . Remote right radical nephrectomy  2009  . Bilateral lumpectomies  2009   Social History:  reports that she has never smoked. She does not have any smokeless tobacco history on file. She reports that she does not drink alcohol or use illicit drugs. Where does patient live - home. Can patient participate in ADLs? yes.  Allergies  Allergen Reactions  . Erythromycin Hives  . Tape     BAND AIDS-SKIN IRRITATION    Family History:  Family History  Problem Relation Age of Onset  . Heart disease Maternal Grandmother   . Heart disease Mother       Prior to Admission medications   Medication Sig Start Date End Date Taking? Authorizing Provider  alendronate (FOSAMAX) 35 MG tablet TAKE 1 TABLET EVERY 7 DAYS WITH A FULL GLASS OF WATER ON AN EMPTY STOMACH. 09/05/14   [name], MD  aspirin 81 MG tablet Take 81 mg by mouth daily.      Historical Provider, MD  bismuth subsalicylate (PEPTO BISMOL) 262 MG/15ML suspension Take 15 mLs by mouth every 6 (six) hours as needed for diarrhea or loose stools.    Historical Provider, MD  Calcium Carbonate-Vitamin D (CALCIUM 600+D PO) Take 1 tablet by mouth daily.     Historical  Provider, MD  cholecalciferol 2000 UNITS tablet Take 1 capsule daily or as directed 09/14/12   Consuela Mimes, MD  doxycycline (VIBRAMYCIN) 100 MG capsule Take 100 mg by mouth daily as needed (for stomach).  12/26/14   Historical Provider, MD  escitalopram (LEXAPRO) 5 MG tablet Take 5 mg by mouth at bedtime.    Historical Provider, MD  furosemide (LASIX) 20 MG tablet Take 20 mg by mouth daily.      Historical Provider, MD  letrozole (FEMARA) 2.5 MG tablet Take 1 tablet (2.5 mg total) by mouth daily. 12/26/14   [name], MD  levothyroxine (SYNTHROID, LEVOTHROID) 50  MCG tablet Take 50 mcg by mouth daily.      Historical Provider, MD  midodrine (PROAMATINE) 2.5 MG tablet TAKE 1 TABLET TWICE A DAY 10/10/14   Thompson Grayer, MD  nitroGLYCERIN (NITRODUR - DOSED IN MG/24 HR) 0.1 mg/hr patch Place 0.1 mg onto the skin daily.  06/14/14   Historical Provider, MD  silver sulfADIAZINE (SILVADENE) 1 % cream Apply 1 application topically daily.  04/19/14   Historical Provider, MD  WELCHOL 625 MG tablet Take 0.5 tablets by mouth daily. 01/13/14   Historical Provider, MD    Physical Exam: Filed Vitals:   02/14/15 2245 02/14/15 2315 02/15/15 0015 02/15/15 0030  BP: 106/73 100/69 97/61 90/66   Pulse: 65 68 61 69  Temp:      TempSrc:      Resp:      SpO2: 96% 93% 93% 93%     General:  Moderately built and nourished.  Eyes: Anicteric. No pallor.  ENT: No discharge from the ears eyes nose or mouth.  Neck: No mass felt.  Cardiovascular: S1-S2 heard.  Respiratory: No rhonchi or crepitations.  Abdomen: Soft nontender bowel sounds present. No guarding or rigidity.  Skin: No rash.  Musculoskeletal: No edema.  Psychiatric: Appears normal.  Neurologic: Alert awake oriented to time place and person. Moves all extremities.  Labs on Admission:  Basic Metabolic Panel:  Recent Labs Lab 02/14/15 2300  NA 137  K 4.4  CL 101  CO2 25  GLUCOSE 124*  BUN 56*  CREATININE 1.30*  CALCIUM 9.7   Liver Function Tests: No results for input(s): AST, ALT, ALKPHOS, BILITOT, PROT, ALBUMIN in the last 168 hours. No results for input(s): LIPASE, AMYLASE in the last 168 hours. No results for input(s): AMMONIA in the last 168 hours. CBC:  Recent Labs Lab 02/14/15 2300  WBC 8.9  NEUTROABS 6.0  HGB 12.5  HCT 38.1  MCV 96.5  PLT 255   Cardiac Enzymes: No results for input(s): CKTOTAL, CKMB, CKMBINDEX, TROPONINI in the last 168 hours.  BNP (last 3 results) No results for input(s): BNP in the last 8760 hours.  ProBNP (last 3 results)  Recent Labs   04/12/14 1500  PROBNP 660.2*    CBG: No results for input(s): GLUCAP in the last 168 hours.  Radiological Exams on Admission: No results found.   Assessment/Plan Principal Problem:   Acute GI bleeding Active Problems:   S/P placement of cardiac pacemaker   GI bleeding   ARF (acute renal failure) (HCC)   Abdominal pain   Hypothyroidism   Left upper quadrant pain   1. Acute GI bleed - source not clear if it is upper or lower. Patient's blood pressure was in the low normal so patient will be admitted to stepdown. Closely follow CBC. Patient will be kept nothing by mouth. Protonix infusion. Consult GI in a.m. 2. Acute renal  failure - probably from dehydration. Gently hydrate and recheck metabolic panel. Closely follow intake output. 3. Left upper quadrant pain - check CT abdomen and pelvis for any ischemic process/colitis or other causes. 4. Hypothyroidism presently on IV Synthroid since patient is nothing by mouth. 5. History of complete heart block status post pacemaker placement - patient is due for pacemaker change in 2 days. I have sent a reminder to cardiology about it. 6. History of peripheral vascular disease patient on nitroglycerin patch which may need to be discontinued if patient's blood pressure drops. 7. History of breast cancer in remission.  I have reviewed patient's old charts and labs.   DVT Prophylaxis SCDs.  Code Status: Full code.  Family Communication: Discussed with patient.  Disposition Plan: Admit to inpatient.    Arcenio Mullaly N. [facility] Pager [phone].  If 7PM-7AM, please contact night-coverage [email or web address] Password [email or web address] 02/15/2015, 1:32 AM

## 2015-02-16 ENCOUNTER — Encounter (HOSPITAL_COMMUNITY): Payer: Self-pay

## 2015-02-16 ENCOUNTER — Encounter (HOSPITAL_COMMUNITY)
Admission: EM | Disposition: A | Discharge status: Home / Self Care | Payer: Self-pay | Source: Home / Self Care | Attending: Internal Medicine

## 2015-02-16 ENCOUNTER — Inpatient Hospital Stay (HOSPITAL_COMMUNITY): Payer: Medicare Other | Admitting: Certified Registered Nurse Anesthetist

## 2015-02-16 DIAGNOSIS — I442 Atrioventricular block, complete: Secondary | ICD-10-CM

## 2015-02-16 DIAGNOSIS — K922 Gastrointestinal hemorrhage, unspecified: Secondary | ICD-10-CM

## 2015-02-16 DIAGNOSIS — K5731 Diverticulosis of large intestine without perforation or abscess with bleeding: Secondary | ICD-10-CM | POA: Diagnosis not present

## 2015-02-16 DIAGNOSIS — R1012 Left upper quadrant pain: Secondary | ICD-10-CM

## 2015-02-16 HISTORY — PX: ESOPHAGOGASTRODUODENOSCOPY: SHX5428

## 2015-02-16 HISTORY — PX: COLONOSCOPY: SHX5424

## 2015-02-16 LAB — GLUCOSE, CAPILLARY
Glucose-Capillary: 109 mg/dL — ABNORMAL HIGH (ref 65–99)
Glucose-Capillary: 86 mg/dL (ref 65–99)
Glucose-Capillary: 78 mg/dL (ref 65–99)

## 2015-02-16 SURGERY — EGD (ESOPHAGOGASTRODUODENOSCOPY)
Anesthesia: Monitor Anesthesia Care

## 2015-02-16 MED ORDER — LIDOCAINE HCL (CARDIAC) 20 MG/ML IV SOLN
INTRAVENOUS | Status: DC | PRN
  Administered 2015-02-16: 50 mg via INTRAVENOUS

## 2015-02-16 MED ORDER — PROPOFOL 10 MG/ML IV BOLUS
INTRAVENOUS | Status: DC | PRN
  Administered 2015-02-16: 20 mg via INTRAVENOUS

## 2015-02-16 MED ORDER — ESCITALOPRAM OXALATE 10 MG PO TABS
5.0000 mg | ORAL_TABLET | Freq: Every day | ORAL | Status: DC
  Administered 2015-02-16: 5 mg via ORAL
  Filled 2015-02-16: qty 1

## 2015-02-16 MED ORDER — MIDODRINE HCL 5 MG PO TABS
2.5000 mg | ORAL_TABLET | Freq: Two times a day (BID) | ORAL | Status: DC
  Administered 2015-02-16 – 2015-02-17 (×2): 2.5 mg via ORAL
  Filled 2015-02-16 (×2): qty 1

## 2015-02-16 MED ORDER — PHENYLEPHRINE HCL 10 MG/ML IJ SOLN
INTRAMUSCULAR | Status: DC | PRN
  Administered 2015-02-16 (×5): 80 ug via INTRAVENOUS

## 2015-02-16 MED ORDER — PANTOPRAZOLE SODIUM 40 MG PO TBEC
40.0000 mg | DELAYED_RELEASE_TABLET | Freq: Every day | ORAL | Status: DC
  Administered 2015-02-16 – 2015-02-17 (×2): 40 mg via ORAL
  Filled 2015-02-16 (×2): qty 1

## 2015-02-16 MED ORDER — PROPOFOL 500 MG/50ML IV EMUL
INTRAVENOUS | Status: DC | PRN
  Administered 2015-02-16: 75 ug/kg/min via INTRAVENOUS

## 2015-02-16 NOTE — Op Note (Signed)
[facility] [place], [id number]   ENDOSCOPY PROCEDURE REPORT  PATIENT: Nichole, [name]  MR#: [id number] BIRTHDATE: 07/07/1927 , 86  yrs. old GENDER: female ENDOSCOPIST: Wilford Corner, MD REFERRED BY: PROCEDURE DATE:  21-Feb-2015 PROCEDURE:  EGD, diagnostic ASA CLASS:     Class III INDICATIONS:  GI bleed. MEDICATIONS: Monitored anesthesia care and Per Anesthesia TOPICAL ANESTHETIC: Cetacaine Spray  DESCRIPTION OF PROCEDURE: After the risks benefits and alternatives of the procedure were thoroughly explained, informed consent was obtained.  The Pentax Gastroscope [id number] endoscope was introduced through the mouth and advanced to the second portion of the duodenum , Without limitations.  The instrument was slowly withdrawn as the mucosa was fully examined. Estimated blood loss is zero unless otherwise noted in this procedure report.    Esophagus and GEJ normal in appearance. GEJ 42 cm from the incisors. Stomach normal. Examined duodenum normal.              The scope was then withdrawn from the patient and the procedure completed.  COMPLICATIONS: There were no immediate complications.  ENDOSCOPIC IMPRESSION:     Normal EGD  RECOMMENDATIONS:     Proceed with colonoscopy   eSigned:  Wilford Corner, MD 2015-02-21 2:10 PM    CC:  CPT CODES: ICD CODES:  The ICD and CPT codes recommended by this software are interpretations from the data that the clinical staff has captured with the software.  The verification of the translation of this report to the ICD and CPT codes and modifiers is the sole responsibility of the health care institution and practicing physician where this report was generated.  [place] will not be held responsible for the validity of the ICD and CPT codes included on this report.  AMA assumes no liability for data contained or not contained herein. CPT is a Designer, television/film set of the  [employer].  PATIENT NAME:  [name], [name] MR#: [id number]

## 2015-02-16 NOTE — Anesthesia Postprocedure Evaluation (Signed)
Anesthesia Post Note  Patient: [name]  Procedure(s) Performed: Procedure(s) (LRB): ESOPHAGOGASTRODUODENOSCOPY (EGD) (N/A) COLONOSCOPY (N/A)  Anesthesia type: MAC  Patient location: PACU  Post pain: Pain level controlled  Post assessment: Patient's Cardiovascular Status Stable  Last Vitals:  Filed Vitals:   02/16/15 1555  BP:   Pulse:   Temp: 36.7 C  Resp:     Post vital signs: Reviewed and stable  Level of consciousness: sedated  Complications: No apparent anesthesia complications

## 2015-02-16 NOTE — Op Note (Signed)
[facility] [place], [address]   COLONOSCOPY PROCEDURE REPORT     EXAM DATE: 03/01/15  PATIENT NAME:      [name], [name]           MR #:      [id number]  BIRTHDATE:       1927/08/30      VISIT #:     [phone]  ATTENDING:     Wilford Corner, MD     STATUS:     outpatient REFERRING MD: ASA CLASS:        Class III  INDICATIONS:  The patient is a 81 yr old female here for a colonoscopy due to GI bleed. PROCEDURE PERFORMED:     Colonoscopy with snare polypectomy MEDICATIONS:     Monitored anesthesia care and Per Anesthesia  ESTIMATED BLOOD LOSS:     None  CONSENT: The patient understands the risks and benefits of the procedure and understands that these risks include, but are not limited to: sedation, allergic reaction, infection, perforation and/or bleeding. Alternative means of evaluation and treatment include, among others: physical exam, x-rays, and/or surgical intervention. The patient elects to proceed with this endoscopic procedure.  DESCRIPTION OF PROCEDURE: During intra-op preparation period all mechanical & medical equipment was checked for proper function. Hand hygiene and appropriate measures for infection prevention was taken. After the risks, benefits and alternatives of the procedure were thoroughly explained, Informed consent was verified, confirmed and timeout was successfully executed by the treatment team. A digital exam revealed no abnormalities of the rectum.      The Pentax Ped Colon [id number] endoscope was introduced through the anus and advanced to the cecum, which was identified by both the appendix and ileocecal valve. The prep was fair.. The instrument was then slowly withdrawn as the colon was fully examined. Estimated blood loss is zero unless otherwise noted in this procedure report. Dark green and black fluid scattered on left side of colon. Diffuse left-sided diverticulae seen. Terminal  ileum intubated and normal in appearance. A 6 mm semi-pedunculated polyp seen in the sigmoid colon removed with snare cautery. Small internal hemorrhoids noted on slow withdrawal from the rectum. The scope was then completely withdrawn from the patient and the procedure terminated.      ADVERSE EVENTS:      There were no immediate complications.   IMPRESSIONS:     Left-sided diverticulosis Colon polyp - s/p snare cautery Suspect GI bleeding was due to diverticular bleed that has resolved   RECOMMENDATIONS:     F/U on path; Advance diet; High fiber diet    Wilford Corner, MD eSigned:  Wilford Corner, MD 2015/03/01 2:43 PM   cc:  CPT CODES: ICD CODES:  The ICD and CPT codes recommended by this software are interpretations from the data that the clinical staff has captured with the software.  The verification of the translation of this report to the ICD and CPT codes and modifiers is the sole responsibility of the health care institution and practicing physician where this report was generated.  [facility] will not be held responsible for the validity of the ICD and CPT codes included on this report.  AMA assumes no liability for data contained or not contained herein. CPT is a Designer, television/film set of the [employer].  PATIENT NAME:  [name], [name] MR#: [id number]

## 2015-02-16 NOTE — Progress Notes (Signed)
SUBJECTIVE: The patient is doing well today.  At this time, she denies chest pain, shortness of breath, or any new concerns.  Recovering from her GI procedures today.  Marland Kitchen antiseptic oral rinse  7 mL Mouth Rinse BID  . escitalopram  5 mg Oral QHS  . levothyroxine  25 mcg Intravenous Daily  . midodrine  2.5 mg Oral BID  . nitroGLYCERIN  0.1 mg Transdermal Daily  . [START ON 02/18/2015] pantoprazole (PROTONIX) IV  40 mg Intravenous Q12H   . sodium chloride 20 mL/hr at 02/15/15 1311  . pantoprozole (PROTONIX) infusion 8 mg/hr (02/15/15 1437)    OBJECTIVE: Physical Exam: Filed Vitals:   02/16/15 1350 02/16/15 1400 02/16/15 1410 02/16/15 1555  BP: 126/80 158/75 127/82   Pulse: 65 65 64   Temp:    98.1 F (36.7 C)  TempSrc:    Oral  Resp: 21 18 23    Height:      Weight:      SpO2: 100% 99% 99%     Intake/Output Summary (Last 24 hours) at 02/16/15 1558 Last data filed at 02/16/15 1328  Gross per 24 hour  Intake   1180 ml  Output      0 ml  Net   1180 ml    Telemetry reveals V pacing  GEN- The patient is elderly appearing, alert and oriented x 3 today.   Head- normocephalic, atraumatic Eyes-  Sclera clear, conjunctiva pink Ears- hearing intact Oropharynx- clear Neck- supple  Lungs- Clear to ausculation bilaterally, normal work of breathing Heart- Regular rate and rhythm (paced) GI- soft, NT, ND, + BS Extremities- no clubbing, cyanosis, + edema Skin- no rash or lesion Psych- euthymic mood, full affect Neuro- strength and sensation are intact  LABS: Basic Metabolic Panel:  Recent Labs  02/14/15 2300 02/15/15 0520  NA 137 139  K 4.4 4.0  CL 101 105  CO2 25 26  GLUCOSE 124* 107*  BUN 56* 50*  CREATININE 1.30* 1.15*  CALCIUM 9.7 8.8*   Liver Function Tests:  Recent Labs  02/15/15 0520  AST 16  ALT 11*  ALKPHOS 52  BILITOT 0.7  PROT 5.5*  ALBUMIN 3.1*   No results for input(s): LIPASE, AMYLASE in the last 72 hours. CBC:  Recent Labs   02/14/15 2300  02/15/15 0520 02/15/15 0927  WBC 8.9  < > 8.0 8.3  NEUTROABS 6.0  --   --   --   HGB 12.5  < > 10.5* 11.3*  HCT 38.1  < > 32.1* 34.4*  MCV 96.5  < > 97.6 97.5  PLT 255  < > 213 221  < > = values in this interval not displayed.   ASSESSMENT AND PLAN:  Principal Problem:   Acute GI bleeding Active Problems:   S/P placement of cardiac pacemaker   Hypothyroidism   Left upper quadrant pain  1. Complete heart block Given admission for GI bleeding and GI procedures today, I think that we should wait 1 week before her generator change.  I am hopeful that this would be beneficial in reducing infectious risks.  I am already concerned about her device pocket from her previous implant experience.  My plan is to have her return at 1 pm on 02/23/15 for generator change by me.  I will likely use a Tyrex pouch at that time.  OK to discharge from EP standpoint once medically stable.  Return next Thursday for gen change by me.   Electrophysiology team to see as  needed while here. Please call with questions.   Thompson Grayer, MD 02/16/2015 3:58 PM

## 2015-02-16 NOTE — Anesthesia Preprocedure Evaluation (Signed)
Anesthesia Evaluation  Patient identified by MRN, date of birth, ID band Patient awake    Reviewed: Allergy & Precautions, NPO status , Patient's Chart, lab work & pertinent test results  Airway Mallampati: I  TM Distance: >3 FB Neck ROM: Full    Dental   Pulmonary    Pulmonary exam normal        Cardiovascular Normal cardiovascular exam+ pacemaker      Neuro/Psych    GI/Hepatic   Endo/Other    Renal/GU      Musculoskeletal   Abdominal   Peds  Hematology   Anesthesia Other Findings   Reproductive/Obstetrics                             Anesthesia Physical Anesthesia Plan  ASA: III  Anesthesia Plan: MAC   Post-op Pain Management:    Induction: Intravenous  Airway Management Planned:   Additional Equipment:   Intra-op Plan:   Post-operative Plan:   Informed Consent: I have reviewed the patients History and Physical, chart, labs and discussed the procedure including the risks, benefits and alternatives for the proposed anesthesia with the patient or authorized representative who has indicated his/her understanding and acceptance.     Plan Discussed with: CRNA and Surgeon  Anesthesia Plan Comments:         Anesthesia Quick Evaluation

## 2015-02-16 NOTE — Clinical Documentation Improvement (Signed)
Internal Medicine  Please clarify the baseline Cr in order to capture the diagnosis of ARF. Please update your documentation within the medical record to reflect your response to this query. Thank you.   Other  Clinically Undetermined  Supporting Information:  Cr on admission was 1.30; on the 19th it was 1.15 - only a drop of 0.15  Please exercise your independent, professional judgment when responding. A specific answer is not anticipated or expected.  Thank You, Zoila Shutter RN, [place] [phone]

## 2015-02-16 NOTE — Anesthesia Postprocedure Evaluation (Signed)
  Anesthesia Post-op Note  Patient: [name]  Procedure(s) Performed: Procedure(s): ESOPHAGOGASTRODUODENOSCOPY (EGD) (N/A) COLONOSCOPY (N/A)  Patient Location: Endoscopy Unit  Anesthesia Type:MAC  Level of Consciousness: awake, alert  and oriented  Airway and Oxygen Therapy: Patient Spontanous Breathing  Post-op Pain: none  Post-op Assessment: Post-op Vital signs reviewed, Patient's Cardiovascular Status Stable, Respiratory Function Stable, Patent Airway and No signs of Nausea or vomiting              Post-op Vital Signs: Reviewed and stable  Last Vitals:  Filed Vitals:   02/16/15 1128  BP: 132/80  Pulse:   Temp: 36.7 C  Resp: 22    Complications: No apparent anesthesia complications

## 2015-02-16 NOTE — Progress Notes (Signed)
[facility] PROGRESS NOTE  [name] VPX:[id number] DOB: [birth year] DOA: 02/14/2015 PCP: [name], MD  Assessment/Plan:  Principal Problem:   Acute GI bleeding: Still with black stool. For EGD and colonoscopy today. Blood pressure and hemoglobin fairly stable. Continue step down monitoring for now. Serial H&H's. Proton pump inhibitor. Active Problems:   S/P placement of cardiac pacemaker: For generator change at some point soon.   Left upper quadrant Abdominal pain: CT scan shows pan diverticulosis and nonspecific inflammation at proximal descending colon. Could be the source of her pain. Might need antibiotics but will ask GI to weigh in.   Hypothyroidism CKD 3  Code Status:  full Family Communication:   Disposition Plan:    HPI/Subjective: Complaining of frequent loose [place] stool post GI prep.  Objective: Filed Vitals:   02/16/15 0400  BP: 101/58  Pulse:   Temp: 98.1 F (36.7 C)  Resp: 15    Intake/Output Summary (Last 24 hours) at 02/16/15 0923 Last data filed at 02/15/15 1900  Gross per 24 hour  Intake 599.67 ml  Output    250 ml  Net 349.67 ml   Filed Weights   02/15/15 0110  Weight: 98.657 kg (217 lb 8 oz)    Exam:   General:  On commode currently. Dark stool noted on linens  Cardiovascular: Regular rate rhythm without murmurs gallops rubs  Respiratory: Clear to auscultation bilaterally without wheezes rhonchi or rales  Abdomen: Soft nontender nondistended  Ext: No clubbing cyanosis or edema  Basic Metabolic Panel:  Recent Labs Lab 02/14/15 2300 02/15/15 0520  NA 137 139  K 4.4 4.0  CL 101 105  CO2 25 26  GLUCOSE 124* 107*  BUN 56* 50*  CREATININE 1.30* 1.15*  CALCIUM 9.7 8.8*   Liver Function Tests:  Recent Labs Lab 02/15/15 0520  AST 16  ALT 11*  ALKPHOS 52  BILITOT 0.7  PROT 5.5*  ALBUMIN 3.1*   No results for input(s): LIPASE, AMYLASE in the last 168 hours. No results for input(s): AMMONIA in the last  168 hours. CBC:  Recent Labs Lab 02/14/15 2300 02/15/15 0153 02/15/15 0520 02/15/15 0927  WBC 8.9 9.4 8.0 8.3  NEUTROABS 6.0  --   --   --   HGB 12.5 11.7* 10.5* 11.3*  HCT 38.1 36.3 32.1* 34.4*  MCV 96.5 96.5 97.6 97.5  PLT 255 234 213 221   Cardiac Enzymes: No results for input(s): CKTOTAL, CKMB, CKMBINDEX, TROPONINI in the last 168 hours. BNP (last 3 results) No results for input(s): BNP in the last 8760 hours.  ProBNP (last 3 results)  Recent Labs  04/12/14 1500  PROBNP 660.2*    CBG:  Recent Labs Lab 02/15/15 0557 02/15/15 1228 02/15/15 1752 02/16/15 0139 02/16/15 0640  GLUCAP 113* 84 126* 109* 86    Recent Results (from the past 240 hour(s))  MRSA PCR Screening     Status: None   Collection Time: 02/15/15  1:16 AM  Result Value Ref Range Status   MRSA by PCR NEGATIVE NEGATIVE Final    Comment:        The GeneXpert MRSA Assay (FDA approved for NASAL specimens only), is one component of a comprehensive MRSA colonization surveillance program. It is not intended to diagnose MRSA infection nor to guide or monitor treatment for MRSA infections.      Studies: Ct Abdomen Pelvis W Contrast  02/15/2015  CLINICAL DATA:  History of left nephrectomy for renal cancer, personal history of breast cancer, abdominal  pain for 3 months with rectal bleeding on 02/14/2015, pain primarily left upper quadrant EXAM: CT ABDOMEN AND PELVIS WITH CONTRAST TECHNIQUE: Multidetector CT imaging of the abdomen and pelvis was performed using the standard protocol following bolus administration of intravenous contrast. CONTRAST:  58mL OMNIPAQUE IOHEXOL 300 MG/ML  SOLN COMPARISON:  04/12/2014 FINDINGS: Lower chest: Mild bilateral lower lobe atelectasis. Mild cardiac enlargement. Cardiac pacer identified. Hepatobiliary: Status post cholecystectomy. No focal hepatic abnormalities. Pancreas: Negative Spleen: Negative Adrenals/Urinary Tract: Mild nodularity to the bilateral adrenal apices,  stable. Midpole right renal cyst stable. Lower pole right renal cystic lesion measures 2.7 cm, unchanged. It again contains a focus of punctate calcification. Left kidney is surgically absent. Small calcified postoperative seroma left renal fossa stable. Stomach/Bowel: Stomach and small bowel are normal. There is significant diverticulosis throughout the entire large bowel. There is mild inflammatory change surrounding the proximal descending colon. Vascular/Lymphatic: No evidence of aneurysm.  No acute findings. Reproductive: Uterus appears to be absent. No evidence of adnexal mass. Other: No ascites.  No free air or abscess. Musculoskeletal: No acute abnormalities IMPRESSION: Severe pan colonic diverticulosis. Mild inflammatory change around the proximal descending colon is nonspecific but may reflect diverticulitis. Other nonacute findings discussed above. Electronically Signed   By: Skipper Cliche M.D.   On: 02/15/2015 11:51    Scheduled Meds: . antiseptic oral rinse  7 mL Mouth Rinse BID  . gentamicin irrigation  80 mg Irrigation On Call  . Influenza vac split quadrivalent PF  0.5 mL Intramuscular Tomorrow-1000  . levothyroxine  25 mcg Intravenous Daily  . nitroGLYCERIN  0.1 mg Transdermal Daily  . [START ON 02/18/2015] pantoprazole (PROTONIX) IV  40 mg Intravenous Q12H   Continuous Infusions: . sodium chloride 20 mL/hr at 02/15/15 1311  . pantoprozole (PROTONIX) infusion 8 mg/hr (02/15/15 1437)    Time spent: 25 minutes  [facility]  [email or web address], password [email or web address] 02/16/2015, 9:23 AM  LOS: 1 day

## 2015-02-16 NOTE — H&P (View-Only) (Signed)
Referring Provider: Dr. Eliseo Squires Primary Care Physician:  [name], MD Primary Gastroenterologist:  Althia Forts  Reason for Consultation:  GI bleed; Heme positive stool  HPI: [name] is a 81 y.o. female with multiple medical problems who had the acute onset of nonbloody diarrhea this past Saturday that occurred 7 times. She took 2 doses of Pepto-Bismol and felt ok Sunday and then on Monday developed black stools. She took another dose of Pepto-Bismol and then had red and black stool several times on Tuesday. Has been having intermittent dull LUQ pain for the past 3-4 months and denies any worsening of this pain over the weekend. Felt weak since onset of the diarrhea with a little dizziness. Denies N/V/GERD/dysphagia/weight loss. Has been belching intermittently for the past 6 months that is new for her. Takes an 81 mg Aspirin but denies other NSAIDs. No BMs last night or today per nursing. Reports a normal colonoscopy 5 years ago by Dr. Earlean Shawl. No history of ulcers. She has a pacemaker for complete heart block.    Past Medical History  Diagnosis Date  . Invasive ductal carcinoma of breast ([place]) 03/2007    BILATERALBREASTS  . Cataract   . Hypercholesterolemia   . Thyroid disease   . Complete heart block (HCC)     s/p PPM implant (MDT) by Dr Blanch Media.  Atrial lead could not be paced at time of the procedure.  She has chronic AV dysociation  . Orthostatic hypotension     treated with midodrine by Dr Rollene [name]  . Exogenous obesity   . Venous insufficiency     Past Surgical History  Procedure Laterality Date  . Abdominal hysterectomy    . Cholecystectomy    . Cataract extraction      EYE SURGERY X 2  . Pacemaker insertion      MDT implanted by Dr Blanch Media.  Atrial lead placement was unsuccessful.  She has chronic AV dysociation  . Remote right radical nephrectomy  2009  . Bilateral lumpectomies  2009    Prior to Admission medications   Medication Sig Start Date End Date Taking?  Authorizing Provider  alendronate (FOSAMAX) 35 MG tablet TAKE 1 TABLET EVERY 7 DAYS WITH A FULL GLASS OF WATER ON AN EMPTY STOMACH 02/15/15   [name], MD  aspirin 81 MG tablet Take 81 mg by mouth daily.      Historical Provider, MD  bismuth subsalicylate (PEPTO BISMOL) 262 MG/15ML suspension Take 15 mLs by mouth every 6 (six) hours as needed for diarrhea or loose stools.    Historical Provider, MD  Calcium Carbonate-Vitamin D (CALCIUM 600+D PO) Take 1 tablet by mouth daily.     Historical Provider, MD  cholecalciferol 2000 UNITS tablet Take 1 capsule daily or as directed 09/14/12   Consuela Mimes, MD  doxycycline (VIBRAMYCIN) 100 MG capsule Take 100 mg by mouth daily as needed (for stomach).  12/26/14   Historical Provider, MD  escitalopram (LEXAPRO) 5 MG tablet Take 5 mg by mouth at bedtime.    Historical Provider, MD  furosemide (LASIX) 20 MG tablet Take 20 mg by mouth daily.      Historical Provider, MD  letrozole (FEMARA) 2.5 MG tablet Take 1 tablet (2.5 mg total) by mouth daily. 12/26/14   [name], MD  levothyroxine (SYNTHROID, LEVOTHROID) 50 MCG tablet Take 50 mcg by mouth daily.      Historical Provider, MD  midodrine (PROAMATINE) 2.5 MG tablet TAKE 1 TABLET TWICE A DAY 10/10/14   Thompson Grayer,  MD  nitroGLYCERIN (NITRODUR - DOSED IN MG/24 HR) 0.1 mg/hr patch Place 0.1 mg onto the skin daily.  06/14/14   Historical Provider, MD  silver sulfADIAZINE (SILVADENE) 1 % cream Apply 1 application topically daily.  04/19/14   Historical Provider, MD  WELCHOL 625 MG tablet Take 0.5 tablets by mouth daily. 01/13/14   Historical Provider, MD    Scheduled Meds: . antiseptic oral rinse  7 mL Mouth Rinse BID  . [START ON 02/16/2015] Influenza vac split quadrivalent PF  0.5 mL Intramuscular Tomorrow-1000  . levothyroxine  25 mcg Intravenous Daily  . nitroGLYCERIN  0.1 mg Transdermal Daily  . [START ON 02/18/2015] pantoprazole (PROTONIX) IV  40 mg Intravenous Q12H   Continuous Infusions: . sodium  chloride 100 mL/hr at 02/15/15 0145  . pantoprozole (PROTONIX) infusion 8 mg/hr (02/15/15 0131)   PRN Meds:.acetaminophen **OR** acetaminophen, ondansetron **OR** ondansetron (ZOFRAN) IV  Allergies as of 02/14/2015 - Review Complete 02/14/2015  Allergen Reaction Noted  . Erythromycin Hives 02/01/2011  . Tape  02/01/2011    Family History  Problem Relation Age of Onset  . Heart disease Maternal Grandmother   . Heart disease Mother     Social History   Social History  . Marital Status: Married    Spouse Name: N/A  . Number of Children: N/A  . Years of Education: N/A   Occupational History  . Not on file.   Social History Main Topics  . Smoking status: Never Smoker   . Smokeless tobacco: Not on file  . Alcohol Use: No  . Drug Use: No  . Sexual Activity: Not Currently   Other Topics Concern  . Not on file   Social History Narrative    Review of Systems: All negative except as stated above in HPI.  Physical Exam: Vital signs: Filed Vitals:   02/15/15 0800  BP: 108/94  Pulse: 65  Temp: 97.9  Resp: 15   Last BM Date: 02/15/15 General:  Elderly, alert,  well-developed, well-nourished, pleasant and cooperative in NAD Head: atraumatic Eyes: anicteric ENT: oropharynx clear, mouth, nose normal Neck: supple, nontender Lungs:  Clear throughout to auscultation.   No wheezes, crackles, or rhonchi. No acute distress. Heart:  Regular rate and rhythm; no murmurs, clicks, rubs,  or gallops. Abdomen: minimal LUQ tenderness with guarding, soft, nondistended, +BS  Rectal:  Deferred Ext: pedal pulses intact, no edema Skin: no rash  GI:  Lab Results:  Recent Labs  02/15/15 0153 02/15/15 0520 02/15/15 0927  WBC 9.4 8.0 8.3  HGB 11.7* 10.5* 11.3*  HCT 36.3 32.1* 34.4*  PLT 234 213 221   BMET  Recent Labs  02/14/15 2300 02/15/15 0520  NA 137 139  K 4.4 4.0  CL 101 105  CO2 25 26  GLUCOSE 124* 107*  BUN 56* 50*  CREATININE 1.30* 1.15*  CALCIUM 9.7 8.8*    LFT  Recent Labs  02/15/15 0520  PROT 5.5*  ALBUMIN 3.1*  AST 16  ALT 11*  ALKPHOS 52  BILITOT 0.7   PT/INR No results for input(s): LABPROT, INR in the last 72 hours.   Studies/Results: No results found.  Impression/Plan: 81 yo with a presentation concerning for a GI bleed however I think her black stools are likely due to her recent Pepto-Bismol use. The red blood in her stool and heme positive stool could be due to hemorrhoids from a rectal outlet source from the diarrhea. That being said she is mildly anemic and I would recommend an updated  colonoscopy due to her abdominal pain and rectal bleeding. Doubt an upper tract source but due to her need for outpt aspirin from a cardiac standpoint I will do an EGD in addition to the colonoscopy that is planned for tomorrow. Abd/pelvis CT planned by primary team. Clears after CT and then NPO p MN. Colon prep to start this afternoon.    LOS: 0 days   [place] C.  02/15/2015, 10:06 AM  Pager [phone]  If no answer or after 5 PM call [phone]

## 2015-02-16 NOTE — Clinical Documentation Improvement (Signed)
Internal Medicine Gastroenterology  Can the diagnosis of anemia be further specified? Please update your documentation within the medical record to reflect your response to this query. Thank you.   Iron deficiency Anemia  Chronic Blood Loss Anemia, including the suspected or known cause  Anemia of chronic disease, including the associated chronic disease state  Other  Clinically Undetermined  Document any associated diagnoses/conditions.  Supporting Information:  That being said she is "mildly anemic"  H&H stable at  11.7/ 26 and 10.5/32  Please exercise your independent, professional judgment when responding. A specific answer is not anticipated or expected.  Thank You,  Zoila Shutter RN, [place] [phone]

## 2015-02-16 NOTE — Interval H&P Note (Signed)
History and Physical Interval Note:  02/16/2015 12:42 PM  [name]  has presented today for surgery, with the diagnosis of GI bleed  The various methods of treatment have been discussed with the patient and family. After consideration of risks, benefits and other options for treatment, the patient has consented to  Procedure(s): ESOPHAGOGASTRODUODENOSCOPY (EGD) (N/A) COLONOSCOPY (N/A) as a surgical intervention .  The patient's history has been reviewed, patient examined, no change in status, stable for surgery.  I have reviewed the patient's chart and labs.  Questions were answered to the patient's satisfaction.     [place] C.

## 2015-02-16 NOTE — Transfer of Care (Signed)
Immediate Anesthesia Transfer of Care Note  Patient: [name]  Procedure(s) Performed: Procedure(s): ESOPHAGOGASTRODUODENOSCOPY (EGD) (N/A) COLONOSCOPY (N/A)  Patient Location: Endoscopy Unit  Anesthesia Type:MAC  Level of Consciousness: awake, alert  and oriented  Airway & Oxygen Therapy: Patient Spontanous Breathing  Post-op Assessment: Report given to RN and Post -op Vital signs reviewed and stable  Post vital signs: Reviewed and stable  Last Vitals:  Filed Vitals:   02/16/15 1128  BP: 132/80  Pulse:   Temp: 36.7 C  Resp: 22    Complications: No apparent anesthesia complications

## 2015-02-17 ENCOUNTER — Encounter (HOSPITAL_COMMUNITY): Payer: Self-pay | Admitting: Gastroenterology

## 2015-02-17 LAB — BASIC METABOLIC PANEL
Anion gap: 9 (ref 5–15)
BUN: 22 mg/dL — AB (ref 6–20)
CALCIUM: 8.2 mg/dL — AB (ref 8.9–10.3)
CO2: 22 mmol/L (ref 22–32)
CREATININE: 1.02 mg/dL — AB (ref 0.44–1.00)
Chloride: 111 mmol/L (ref 101–111)
GFR calc Af Amer: 56 mL/min — ABNORMAL LOW (ref >60)
GFR, EST NON AFRICAN AMERICAN: 49 mL/min — AB (ref >60)
GLUCOSE: 92 mg/dL (ref 65–99)
Potassium: 4 mmol/L (ref 3.5–5.1)
Sodium: 142 mmol/L (ref 135–145)

## 2015-02-17 LAB — HEMOGLOBIN AND HEMATOCRIT, BLOOD
HCT: 29.7 % — ABNORMAL LOW (ref 36.0–46.0)
HEMATOCRIT: 25.9 % — AB (ref 36.0–46.0)
HEMOGLOBIN: 8.4 g/dL — AB (ref 12.0–15.0)
Hemoglobin: 9.4 g/dL — ABNORMAL LOW (ref 12.0–15.0)

## 2015-02-17 MED ORDER — LEVOTHYROXINE SODIUM 50 MCG PO TABS
50.0000 ug | ORAL_TABLET | Freq: Every day | ORAL | Status: DC
  Administered 2015-02-17: 50 ug via ORAL
  Filled 2015-02-17: qty 1

## 2015-02-17 NOTE — Progress Notes (Signed)
Discharging pt to home. Vital signs stable, discharge teaching complete, IV line removed.

## 2015-02-17 NOTE — Progress Notes (Signed)
Lab called to make nurse aware of 3 g/dL drop in pts hmg after last check, NP made aware. Hemoglobin now 8.4.

## 2015-02-17 NOTE — Care Management Important Message (Signed)
Important Message  Patient Details  Name: [name] MRN: [id number] Date of Birth: [birth year]   Medicare Important Message Given:  Yes-second notification given    Nathen May 02/17/2015, 11:43 AM

## 2015-02-17 NOTE — Discharge Summary (Signed)
Physician Discharge Summary  ARNITRA SOKOLOSKI BDZ:[id number] DOB: 09/05/1927 DOA: 02/14/2015  PCP: [name], MD  Admit date: 02/14/2015 Discharge date: 02/17/2015  Time spent: greater than 30 minutes  Recommendations for Outpatient Follow-up:  1. F/u EP for pacemaker generator change on 10/27 at 1 pm  Discharge Diagnoses:  Principal Problem:   Acute GI bleeding Active Problems:   S/P placement of cardiac pacemaker   Hypothyroidism   Left upper quadrant pain   CHB (complete heart block) ([place])   Discharge Condition: stable  Diet recommendation: heart healthy  Filed Weights   02/15/15 0110  Weight: 98.657 kg (217 lb 8 oz)    History of present illness:  81 y.o. female with history of hypothyroidism, complete heart block status post pacemaker placement, breast cancer in remission presents to the ER because of rectal bleeding. Patient states over the last 24 hours patient has had 6-7 episodes of rectal bleeding. Initially it was dark black in color later became more frankly bloody. Patient states over the last 2 months patient has been experiencing left upper quadrant pain which has become more constant. Patient has been trying some Pepto-Bismol. Patient also tried some doxycycline last 3 days on the first day after trying patient had at least 7 episodes of diarrhea. Patient denies any chest pain shortness of breath dizziness or loss of consciousness. Denies any nausea vomiting. Patient states she has had a colonoscopy 5 years ago which was unremarkable. Patient does not recall her gastroenterologist name. Patient is also due for her pacemaker battery changed 2 days from now. In the ER stool for occult blood was positive with dark stools. Patient denies taking any NSAIDs though patient is on aspirin.    Hospital Course:  Acute GI bleeding:  Started on PPI and admitted to stepdown. GI consulted. EGD normal. Colonoscopy showed left sided diverticulosis and colon polyp s/p snare  cautery. Suspect bleeding was due to diverticular bleed that has resolve   S/P placement of cardiac pacemaker: For generator change next week   Left upper quadrant Abdominal pain: CT scan shows pan diverticulosis and nonspecific inflammation at proximal descending colon. GI saw no diverticulitis on colonoscopy  Procedures:  EGD and colonoscopy with snare cautery of polyp. See above  Consultations:  GI  EP  Discharge Exam: Filed Vitals:   02/17/15 0951  BP: 111/30  Pulse: 65  Temp: 97.6 F (36.4 C)  Resp: 15    General: a and o Cardiovascular: RRR Respiratory: CTA abd s, nt, nd  Discharge Instructions   Discharge Instructions    Diet - low sodium heart healthy    Complete by:  As directed      Increase activity slowly    Complete by:  As directed           Current Discharge Medication List    CONTINUE these medications which have NOT CHANGED   Details  alendronate (FOSAMAX) 35 MG tablet TAKE 1 TABLET EVERY 7 DAYS WITH A FULL GLASS OF WATER ON AN EMPTY STOMACH Qty: 12 tablet, Refills: 1    Calcium Carbonate-Vitamin D (CALCIUM 600+D PO) Take 1 tablet by mouth daily.     cholecalciferol 2000 UNITS tablet Take 1 capsule daily or as directed Qty: 90 tablet, Refills: 7    escitalopram (LEXAPRO) 5 MG tablet Take 5 mg by mouth at bedtime.    furosemide (LASIX) 20 MG tablet Take 20 mg by mouth daily.      letrozole (FEMARA) 2.5 MG tablet Take 1 tablet (  2.5 mg total) by mouth daily. Qty: 90 tablet, Refills: 3   Associated Diagnoses: Bilateral breast cancer (HCC)    levothyroxine (SYNTHROID, LEVOTHROID) 50 MCG tablet Take 50 mcg by mouth daily.      midodrine (PROAMATINE) 2.5 MG tablet TAKE 1 TABLET TWICE A DAY Qty: 180 tablet, Refills: 2    nitroGLYCERIN (NITRODUR - DOSED IN MG/24 HR) 0.1 mg/hr patch Place 0.1 mg onto the skin daily.  Refills: 4    silver sulfADIAZINE (SILVADENE) 1 % cream Apply 1 application topically daily.  Refills: 13    WELCHOL 625  MG tablet Take 0.5 tablets by mouth daily. Refills: 2      STOP taking these medications     aspirin 81 MG tablet      bismuth subsalicylate (PEPTO BISMOL) 262 MG/15ML suspension      doxycycline (VIBRAMYCIN) 100 MG capsule        Allergies  Allergen Reactions  . Erythromycin Hives  . Tape     BAND AIDS-SKIN IRRITATION   Follow-up Information    Follow up with [name], MD On 02/20/2015.   Specialty:  Internal Medicine   Why:  to check blood count   Contact information:   [address] [place] [address] [phone]        The results of significant diagnostics from this hospitalization (including imaging, microbiology, ancillary and laboratory) are listed below for reference.    Significant Diagnostic Studies: Ct Abdomen Pelvis W Contrast  02/15/2015  CLINICAL DATA:  History of left nephrectomy for renal cancer, personal history of breast cancer, abdominal pain for 3 months with rectal bleeding on 02/14/2015, pain primarily left upper quadrant EXAM: CT ABDOMEN AND PELVIS WITH CONTRAST TECHNIQUE: Multidetector CT imaging of the abdomen and pelvis was performed using the standard protocol following bolus administration of intravenous contrast. CONTRAST:  70mL OMNIPAQUE IOHEXOL 300 MG/ML  SOLN COMPARISON:  04/12/2014 FINDINGS: Lower chest: Mild bilateral lower lobe atelectasis. Mild cardiac enlargement. Cardiac pacer identified. Hepatobiliary: Status post cholecystectomy. No focal hepatic abnormalities. Pancreas: Negative Spleen: Negative Adrenals/Urinary Tract: Mild nodularity to the bilateral adrenal apices, stable. Midpole right renal cyst stable. Lower pole right renal cystic lesion measures 2.7 cm, unchanged. It again contains a focus of punctate calcification. Left kidney is surgically absent. Small calcified postoperative seroma left renal fossa stable. Stomach/Bowel: Stomach and small bowel are normal. There is significant diverticulosis throughout  the entire large bowel. There is mild inflammatory change surrounding the proximal descending colon. Vascular/Lymphatic: No evidence of aneurysm.  No acute findings. Reproductive: Uterus appears to be absent. No evidence of adnexal mass. Other: No ascites.  No free air or abscess. Musculoskeletal: No acute abnormalities IMPRESSION: Severe pan colonic diverticulosis. Mild inflammatory change around the proximal descending colon is nonspecific but may reflect diverticulitis. Other nonacute findings discussed above. Electronically Signed   By: Skipper Cliche M.D.   On: 02/15/2015 11:51    Microbiology: Recent Results (from the past 240 hour(s))  MRSA PCR Screening     Status: None   Collection Time: 02/15/15  1:16 AM  Result Value Ref Range Status   MRSA by PCR NEGATIVE NEGATIVE Final    Comment:        The GeneXpert MRSA Assay (FDA approved for NASAL specimens only), is one component of a comprehensive MRSA colonization surveillance program. It is not intended to diagnose MRSA infection nor to guide or monitor treatment for MRSA infections.      Labs: Basic Metabolic Panel:  Recent Labs Lab 02/14/15 2300 02/15/15 0520 02/17/15 0415  NA 137 139 142  K 4.4 4.0 4.0  CL 101 105 111  CO2 25 26 22   GLUCOSE 124* 107* 92  BUN 56* 50* 22*  CREATININE 1.30* 1.15* 1.02*  CALCIUM 9.7 8.8* 8.2*   Liver Function Tests:  Recent Labs Lab 02/15/15 0520  AST 16  ALT 11*  ALKPHOS 52  BILITOT 0.7  PROT 5.5*  ALBUMIN 3.1*   No results for input(s): LIPASE, AMYLASE in the last 168 hours. No results for input(s): AMMONIA in the last 168 hours. CBC:  Recent Labs Lab 02/14/15 2300 02/15/15 0153 02/15/15 0520 02/15/15 0927 02/17/15 0415 02/17/15 0830  WBC 8.9 9.4 8.0 8.3  --   --   NEUTROABS 6.0  --   --   --   --   --   HGB 12.5 11.7* 10.5* 11.3* 8.4* 9.4*  HCT 38.1 36.3 32.1* 34.4* 25.9* 29.7*  MCV 96.5 96.5 97.6 97.5  --   --   PLT 255 234 213 221  --   --    Cardiac  Enzymes: No results for input(s): CKTOTAL, CKMB, CKMBINDEX, TROPONINI in the last 168 hours. BNP: BNP (last 3 results) No results for input(s): BNP in the last 8760 hours.  ProBNP (last 3 results)  Recent Labs  04/12/14 1500  PROBNP 660.2*    CBG:  Recent Labs Lab 02/15/15 1228 02/15/15 1752 02/16/15 0139 02/16/15 0640 02/16/15 1612  GLUCAP 84 126* 109* 86 78       Signed:  Sweta Halseth L  [facility] 02/17/2015, 1:10 PM

## 2015-02-17 NOTE — Progress Notes (Signed)
Patient ID: [name], female   DOB: Jul 11, 1927, 81 y.o.   MRN: [id number]  Feels good. Black stools overnight. Tolerating diet. Friend at bedside.  Hgb 9.4 (8.4, 11.3).  Suspect bleeding from diverticular bleed. Follow H/Hs. Patient's birthday and hopes she can go home. Ok to go home from GI standpoint but will need H/H by her primary doctor on Monday. Will defer to primary team. Will sign off. Call if questions.

## 2015-02-20 DIAGNOSIS — E039 Hypothyroidism, unspecified: Secondary | ICD-10-CM | POA: Diagnosis not present

## 2015-02-20 DIAGNOSIS — K5791 Diverticulosis of intestine, part unspecified, without perforation or abscess with bleeding: Secondary | ICD-10-CM | POA: Diagnosis not present

## 2015-02-20 DIAGNOSIS — E559 Vitamin D deficiency, unspecified: Secondary | ICD-10-CM | POA: Diagnosis not present

## 2015-02-20 DIAGNOSIS — M8580 Other specified disorders of bone density and structure, unspecified site: Secondary | ICD-10-CM | POA: Diagnosis not present

## 2015-02-20 DIAGNOSIS — Z0000 Encounter for general adult medical examination without abnormal findings: Secondary | ICD-10-CM | POA: Diagnosis not present

## 2015-02-23 ENCOUNTER — Ambulatory Visit (HOSPITAL_COMMUNITY)
Admission: RE | Admit: 2015-02-23 | Discharge: 2015-02-23 | Disposition: A | Discharge status: Home / Self Care | Payer: Medicare Other | Source: Ambulatory Visit | Attending: Internal Medicine | Admitting: Internal Medicine

## 2015-02-23 ENCOUNTER — Encounter (HOSPITAL_COMMUNITY)
Admission: RE | Disposition: A | Discharge status: Home / Self Care | Payer: Medicare Other | Source: Ambulatory Visit | Attending: Internal Medicine

## 2015-02-23 DIAGNOSIS — E669 Obesity, unspecified: Secondary | ICD-10-CM | POA: Diagnosis not present

## 2015-02-23 DIAGNOSIS — I951 Orthostatic hypotension: Secondary | ICD-10-CM | POA: Insufficient documentation

## 2015-02-23 DIAGNOSIS — Z7983 Long term (current) use of bisphosphonates: Secondary | ICD-10-CM | POA: Diagnosis not present

## 2015-02-23 DIAGNOSIS — Z45018 Encounter for adjustment and management of other part of cardiac pacemaker: Secondary | ICD-10-CM

## 2015-02-23 DIAGNOSIS — Z7982 Long term (current) use of aspirin: Secondary | ICD-10-CM | POA: Insufficient documentation

## 2015-02-23 DIAGNOSIS — Z79899 Other long term (current) drug therapy: Secondary | ICD-10-CM | POA: Diagnosis not present

## 2015-02-23 DIAGNOSIS — E039 Hypothyroidism, unspecified: Secondary | ICD-10-CM | POA: Diagnosis not present

## 2015-02-23 DIAGNOSIS — I442 Atrioventricular block, complete: Secondary | ICD-10-CM | POA: Insufficient documentation

## 2015-02-23 DIAGNOSIS — Z45010 Encounter for checking and testing of cardiac pacemaker pulse generator [battery]: Secondary | ICD-10-CM | POA: Diagnosis not present

## 2015-02-23 DIAGNOSIS — E7800 Pure hypercholesterolemia, unspecified: Secondary | ICD-10-CM | POA: Insufficient documentation

## 2015-02-23 DIAGNOSIS — Z6837 Body mass index (BMI) 37.0-37.9, adult: Secondary | ICD-10-CM | POA: Diagnosis not present

## 2015-02-23 DIAGNOSIS — Z853 Personal history of malignant neoplasm of breast: Secondary | ICD-10-CM | POA: Diagnosis not present

## 2015-02-23 HISTORY — PX: EP IMPLANTABLE DEVICE: SHX172B

## 2015-02-23 LAB — CBC
HEMATOCRIT: 33.6 % — AB (ref 36.0–46.0)
Hemoglobin: 10.7 g/dL — ABNORMAL LOW (ref 12.0–15.0)
MCH: 32.1 pg (ref 26.0–34.0)
MCHC: 31.8 g/dL (ref 30.0–36.0)
MCV: 100.9 fL — ABNORMAL HIGH (ref 78.0–100.0)
Platelets: 320 10*3/uL (ref 150–400)
RBC: 3.33 MIL/uL — ABNORMAL LOW (ref 3.87–5.11)
RDW: 15.6 % — AB (ref 11.5–15.5)
WBC: 8.9 10*3/uL (ref 4.0–10.5)

## 2015-02-23 SURGERY — PPM/BIV PPM GENERATOR CHANGEOUT
Anesthesia: LOCAL

## 2015-02-23 MED ORDER — MUPIROCIN 2 % EX OINT
TOPICAL_OINTMENT | CUTANEOUS | Status: AC
  Filled 2015-02-23: qty 22

## 2015-02-23 MED ORDER — LIDOCAINE-EPINEPHRINE 1 %-1:100000 IJ SOLN
INTRAMUSCULAR | Status: DC | PRN
  Administered 2015-02-23: 30 mL

## 2015-02-23 MED ORDER — CHLORHEXIDINE GLUCONATE 4 % EX LIQD
60.0000 mL | Freq: Once | CUTANEOUS | Status: DC
  Filled 2015-02-23: qty 60

## 2015-02-23 MED ORDER — MIDAZOLAM HCL 5 MG/5ML IJ SOLN
INTRAMUSCULAR | Status: AC
  Filled 2015-02-23: qty 25

## 2015-02-23 MED ORDER — LIDOCAINE-EPINEPHRINE 1 %-1:100000 IJ SOLN
INTRAMUSCULAR | Status: AC
  Filled 2015-02-23: qty 2

## 2015-02-23 MED ORDER — CEFAZOLIN SODIUM-DEXTROSE 2-3 GM-% IV SOLR
INTRAVENOUS | Status: DC | PRN
  Administered 2015-02-23: 2 g via INTRAVENOUS

## 2015-02-23 MED ORDER — LIDOCAINE-EPINEPHRINE 1 %-1:100000 IJ SOLN
INTRAMUSCULAR | Status: DC | PRN
  Administered 2015-02-23: 60 mL

## 2015-02-23 MED ORDER — CEFAZOLIN SODIUM-DEXTROSE 2-3 GM-% IV SOLR: 2.0000 g | INTRAVENOUS | Status: DC

## 2015-02-23 MED ORDER — HEPARIN (PORCINE) IN NACL 2-0.9 UNIT/ML-% IJ SOLN
INTRAMUSCULAR | Status: DC | PRN
  Administered 2015-02-23: 18:00:00

## 2015-02-23 MED ORDER — MIDAZOLAM HCL 5 MG/5ML IJ SOLN
INTRAMUSCULAR | Status: DC | PRN
  Administered 2015-02-23 (×2): 1 mg via INTRAVENOUS

## 2015-02-23 MED ORDER — SODIUM CHLORIDE 0.9 % IV SOLN
INTRAVENOUS | Status: DC
  Administered 2015-02-23: 14:00:00 via INTRAVENOUS

## 2015-02-23 MED ORDER — SODIUM CHLORIDE 0.9 % IR SOLN
Status: AC
  Filled 2015-02-23: qty 2

## 2015-02-23 MED ORDER — FENTANYL CITRATE (PF) 100 MCG/2ML IJ SOLN
INTRAMUSCULAR | Status: AC
  Filled 2015-02-23: qty 4

## 2015-02-23 MED ORDER — GENTAMICIN SULFATE 40 MG/ML IJ SOLN
INTRAMUSCULAR | Status: AC
  Filled 2015-02-23: qty 2

## 2015-02-23 MED ORDER — FENTANYL CITRATE (PF) 100 MCG/2ML IJ SOLN
INTRAMUSCULAR | Status: DC | PRN
  Administered 2015-02-23: 25 ug via INTRAVENOUS
  Administered 2015-02-23: 12.5 ug via INTRAVENOUS

## 2015-02-23 SURGICAL SUPPLY — 5 items
CABLE SURGICAL S-101-97-12 (CABLE) ×2 IMPLANT
PAD DEFIB LIFELINK (PAD) ×2 IMPLANT
POUCH AIGIS-R ANTIBACT ICD (Mesh General) ×4 IMPLANT
PPM SENSIA SR SESR01 (Pacemaker) ×2 IMPLANT
TRAY PACEMAKER INSERTION (PACKS) ×2 IMPLANT

## 2015-02-23 NOTE — H&P (Signed)
Patient ID: TISH BEGIN MRN: [id number], DOB/AGE: 06/10/1927 81 y.o.  Admit date: 02/14/2015 Date of Consult: 02/15/2015  Primary Physician: [name], MD Electrophysiologist: Allred  Reason for Consultation: PPM at [facility]  HPI:  [name] is a 81 y.o. female with a past medical history significnat for hyperlipidemia, hypothyroidism, orthostatic hypotension, obesity, and complete heart block s/p PPM implant. At time of PPM implant, atrial lead was unable to be placed and she has done well with VVIR pacemaker system. Her device reached [facility] 12-28-14 and she was scheduled for generator change 02-16-15. She presented this morning with rectal bleeding and abdominal pain. GI consult is pending. EP has been asked to evaluate for timing of pacemaker generator change.   She currently denies chest pain, shortness of breath, LE edema, recent fevers or chills.   Past Medical History  Diagnosis Date  . Invasive ductal carcinoma of breast ([place]) 03/2007    BILATERALBREASTS  . Cataract   . Hypercholesterolemia   . Thyroid disease   . Complete heart block (HCC)     s/p PPM implant (MDT) by Dr Blanch Media. Atrial lead could not be paced at time of the procedure. She has chronic AV dysociation  . Orthostatic hypotension     treated with midodrine by Dr Rollene [name]  . Exogenous obesity   . Venous insufficiency      Surgical History:  Past Surgical History  Procedure Laterality Date  . Abdominal hysterectomy    . Cholecystectomy    . Cataract extraction      EYE SURGERY X 2  . Pacemaker insertion      MDT implanted by Dr Blanch Media. Atrial lead placement was unsuccessful. She has chronic AV dysociation  . Remote right radical nephrectomy  2009  . Bilateral lumpectomies  2009     Prescriptions prior to admission  Medication Sig Dispense Refill Last Dose  . alendronate (FOSAMAX) 35 MG tablet TAKE 1 TABLET  EVERY 7 DAYS WITH A FULL GLASS OF WATER ON AN EMPTY STOMACH. 12 tablet 1 Taking  . aspirin 81 MG tablet Take 81 mg by mouth daily.    Taking  . bismuth subsalicylate (PEPTO BISMOL) 262 MG/15ML suspension Take 15 mLs by mouth every 6 (six) hours as needed for diarrhea or loose stools.     . Calcium Carbonate-Vitamin D (CALCIUM 600+D PO) Take 1 tablet by mouth daily.    Taking  . cholecalciferol 2000 UNITS tablet Take 1 capsule daily or as directed 90 tablet 7 Taking  . doxycycline (VIBRAMYCIN) 100 MG capsule Take 100 mg by mouth daily as needed (for stomach).   2   . escitalopram (LEXAPRO) 5 MG tablet Take 5 mg by mouth at bedtime.     . furosemide (LASIX) 20 MG tablet Take 20 mg by mouth daily.    Taking  . letrozole (FEMARA) 2.5 MG tablet Take 1 tablet (2.5 mg total) by mouth daily. 90 tablet 3 Taking  . levothyroxine (SYNTHROID, LEVOTHROID) 50 MCG tablet Take 50 mcg by mouth daily.    Taking  . midodrine (PROAMATINE) 2.5 MG tablet TAKE 1 TABLET TWICE A DAY 180 tablet 2 Taking  . nitroGLYCERIN (NITRODUR - DOSED IN MG/24 HR) 0.1 mg/hr patch Place 0.1 mg onto the skin daily.   4 Taking  . silver sulfADIAZINE (SILVADENE) 1 % cream Apply 1 application topically daily.   13 Taking  . WELCHOL 625 MG tablet Take 0.5 tablets by mouth daily.  2 Taking    Inpatient Medications:  .  antiseptic oral rinse 7 mL Mouth Rinse BID  . [START ON 02/16/2015] Influenza vac split quadrivalent PF 0.5 mL Intramuscular Tomorrow-1000  . levothyroxine 25 mcg Intravenous Daily  . nitroGLYCERIN 0.1 mg Transdermal Daily  . [START ON 02/18/2015] pantoprazole (PROTONIX) IV 40 mg Intravenous Q12H    Allergies:  Allergies  Allergen Reactions  . Erythromycin Hives  . Tape     BAND AIDS-SKIN IRRITATION    Social History   Social History  . Marital Status: Married    Spouse Name:  N/A  . Number of Children: N/A  . Years of Education: N/A   Occupational History  . Not on file.   Social History Main Topics  . Smoking status: Never Smoker   . Smokeless tobacco: Not on file  . Alcohol Use: No  . Drug Use: No  . Sexual Activity: Not Currently   Other Topics Concern  . Not on file   Social History Narrative     Family History  Problem Relation Age of Onset  . Heart disease Maternal Grandmother   . Heart disease Mother      Review of Systems: All other systems reviewed and are otherwise negative except as noted above.  Physical Exam: Filed Vitals:   02/15/15 0500 02/15/15 0600 02/15/15 0700 02/15/15 0800  BP: 94/70 97/43 108/94   Pulse: 64 65 65   Temp:      TempSrc:      Resp: 19 16 17 15   Height:      Weight:      SpO2:        GEN- The patient is elderly and obese appearing, alert and oriented x 3 today.  HEENT: normocephalic, atraumatic; sclera clear, conjunctiva pink; hearing intact; oropharynx clear; neck supple  Lungs- Clear to ausculation bilaterally, normal work of breathing. No wheezes, rales, rhonchi Heart- Regular rate and rhythm (paced) GI- soft, non-tender, non-distended, bowel sounds present  Extremities- no clubbing, cyanosis, or edema; DP/PT/radial pulses 2+ bilaterally MS- no significant deformity or atrophy Skin- warm and dry, no rash or lesion Psych- euthymic mood, full affect Neuro- strength and sensation are intact  Labs:  Lab Results  Component Value Date   WBC 8.0 02/15/2015   HGB 10.5* 02/15/2015   HCT 32.1* 02/15/2015   MCV 97.6 02/15/2015   PLT 213 02/15/2015    Recent Labs Lab 02/15/15 0520  NA 139  K 4.0  CL 105  CO2 26  BUN 50*  CREATININE 1.15*  CALCIUM 8.8*  PROT 5.5*  BILITOT 0.7  ALKPHOS 52  ALT 11*  AST 16  GLUCOSE 107*      Radiology/Studies:    Imaging Results    No results found.     TELEMETRY: V paced at 65, AV dissociation  Assessment/Plan: 1. Complete heart block S/p PPM - now at [facility] and reverted to VVI 65 (was programmed VVIR 70) Atrial lead unable to be placed at time of original implant. She and Dr Rayann Heman had a discussion in the office about attempting to place atrial lead at time of gen change with sinus rhythm, but she was clear in her decision to have that done. She continues to still just want generator replaced. She does have some tethering over the device and a discussion was also had about concern for infection. She is aware of these concerns. [name] plan to use antibiotic pouch at time of generator change.  With acute GI bleed, [name] postpone gen change for now. Could do day before or  of discharge if she is medically stable. From a device standpoint, we have another 6 weeks before gen change is required.   2. GI bleed Management per primary team  Signed, Chanetta Marshall, NP 02/15/2015 8:59 AM  I have seen and examined this patient with Chanetta Marshall. Agree with above, note added to reflect my findings. On exam, regular rhythm, no murmurs, lungs clear. Had GI bleed being currently treated. Needs generator change. [name] see what results from Gi workup are prior to generator change. May be able to do Friday.   [name] M. Sota Hetz MD 02/16/2015 9:22 AM        Patient seen and examined.  Has pacemaker that is at [facility].  Was admitted to the hospital last week for GI bleed so gen change delayed until today.  Patient feeling well without complaints.  Discussed the benefits and risks of the procedure including bleeding and infection.  The patient has agreed to have her generator change today.    [name] 02/23/2015 1:09 PM

## 2015-02-23 NOTE — Discharge Instructions (Signed)

## 2015-02-24 ENCOUNTER — Encounter (HOSPITAL_COMMUNITY): Payer: Self-pay | Admitting: Cardiology

## 2015-02-24 MED FILL — Sodium Chloride Irrigation Soln 0.9%: Qty: 500 | Status: AC

## 2015-02-24 MED FILL — Gentamicin Sulfate Inj 40 MG/ML: INTRAMUSCULAR | Qty: 2 | Status: AC

## 2015-03-01 ENCOUNTER — Encounter: Payer: Medicare Other | Admitting: Internal Medicine

## 2015-03-02 ENCOUNTER — Ambulatory Visit (INDEPENDENT_AMBULATORY_CARE_PROVIDER_SITE_OTHER): Payer: Medicare Other | Admitting: *Deleted

## 2015-03-02 ENCOUNTER — Encounter: Payer: Medicare Other | Admitting: *Deleted

## 2015-03-02 DIAGNOSIS — Z950 Presence of cardiac pacemaker: Secondary | ICD-10-CM

## 2015-03-02 NOTE — Progress Notes (Signed)
[name] presents to the office today mistakenly for a "remote pacer check". PM generator replaced 02/23/15. Tegaderm/gauze dressing removed. Steri-strips intact. Wound without redness, swelling and drainage. Wound check appt made for 03/06/15 (10-14d s/p procedure).

## 2015-03-06 ENCOUNTER — Ambulatory Visit (INDEPENDENT_AMBULATORY_CARE_PROVIDER_SITE_OTHER): Payer: Medicare Other | Admitting: *Deleted

## 2015-03-06 DIAGNOSIS — Z950 Presence of cardiac pacemaker: Secondary | ICD-10-CM

## 2015-03-06 DIAGNOSIS — I442 Atrioventricular block, complete: Secondary | ICD-10-CM

## 2015-03-06 LAB — CUP PACEART INCLINIC DEVICE CHECK
Battery Impedance: 100 Ohm
Brady Statistic RV Percent Paced: 100 %
Implantable Lead Model: 4076
Lead Channel Impedance Value: 0 Ohm
Lead Channel Impedance Value: 393 Ohm
Lead Channel Pacing Threshold Amplitude: 1.25 V
Lead Channel Pacing Threshold Pulse Width: 0.4 ms
Lead Channel Setting Pacing Amplitude: 2.5 V
MDC IDC LEAD IMPLANT DT: 20141208
MDC IDC LEAD LOCATION: 753860
MDC IDC MSMT BATTERY REMAINING LONGEVITY: 120 mo
MDC IDC MSMT BATTERY VOLTAGE: 2.79 V
MDC IDC MSMT LEADCHNL RV PACING THRESHOLD AMPLITUDE: 1.125 V
MDC IDC MSMT LEADCHNL RV PACING THRESHOLD PULSEWIDTH: 0.4 ms
MDC IDC SESS DTM: 20161107151513
MDC IDC SET LEADCHNL RV PACING PULSEWIDTH: 0.4 ms
MDC IDC SET LEADCHNL RV SENSING SENSITIVITY: 4 mV

## 2015-03-06 NOTE — Progress Notes (Signed)
Change out wound check appointment. Steri-strips removed. Wound without redness or edema. Incision edges approximated, wound well healed. Normal device function. Threshold, sensing, and impedances consistent with implant measurements. Changed RV output from 2.25 to 2.5V. Histogram distribution appropriate for patient and level of activity. No high ventricular rates noted. Patient educated about wound care, arm mobility, lifting restrictions. ROV w/ JA 05/29/15.

## 2015-03-13 ENCOUNTER — Encounter: Payer: Self-pay | Admitting: Internal Medicine

## 2015-03-28 DIAGNOSIS — K5791 Diverticulosis of intestine, part unspecified, without perforation or abscess with bleeding: Secondary | ICD-10-CM | POA: Diagnosis not present

## 2015-04-03 DIAGNOSIS — E039 Hypothyroidism, unspecified: Secondary | ICD-10-CM | POA: Diagnosis not present

## 2015-04-03 DIAGNOSIS — D649 Anemia, unspecified: Secondary | ICD-10-CM | POA: Diagnosis not present

## 2015-04-03 DIAGNOSIS — M8580 Other specified disorders of bone density and structure, unspecified site: Secondary | ICD-10-CM | POA: Diagnosis not present

## 2015-04-03 DIAGNOSIS — Z0000 Encounter for general adult medical examination without abnormal findings: Secondary | ICD-10-CM | POA: Diagnosis not present

## 2015-04-19 ENCOUNTER — Encounter: Payer: Self-pay | Admitting: *Deleted

## 2015-05-03 ENCOUNTER — Encounter: Payer: Self-pay | Admitting: Cardiology

## 2015-05-03 ENCOUNTER — Ambulatory Visit (INDEPENDENT_AMBULATORY_CARE_PROVIDER_SITE_OTHER): Payer: Medicare Other | Admitting: Cardiology

## 2015-05-03 VITALS — BP 130/86 | HR 86 | Ht 67.5 in | Wt 222.8 lb

## 2015-05-03 DIAGNOSIS — Z950 Presence of cardiac pacemaker: Secondary | ICD-10-CM

## 2015-05-03 DIAGNOSIS — I442 Atrioventricular block, complete: Secondary | ICD-10-CM

## 2015-05-03 NOTE — Patient Instructions (Signed)
Continue your current therapy  I will see you in one year   

## 2015-05-03 NOTE — Progress Notes (Signed)
[name] Date of Birth: Oct 03, 1927 Medical Record [id number]  History of Present Illness: [name] is seen today for follow up of complete heart block.  She has a history of complete heart block with 12 second pause and underwent permanent pacemaker implant on 10/09/2007 with a [employer]onic adapta VVI pacemaker. She had revision of her pacemaker on February 23, 2015. She was admitted at that time with a GI bleed felt to be diverticular. No recurrence.  She also has a history of orthostatic dizziness managed with midodrine. She has no history of coronary disease or congestive heart failure.  On follow up today she is doing well. No chest pain, SOB, dizziness, or palpitations. Chronic ulcer on right ankle has healed.   Current Outpatient Prescriptions on File Prior to Visit  Medication Sig Dispense Refill  . alendronate (FOSAMAX) 35 MG tablet TAKE 1 TABLET EVERY 7 DAYS WITH A FULL GLASS OF WATER ON AN EMPTY STOMACH 12 tablet 1  . aspirin 81 MG tablet Take 81 mg by mouth daily.    . Calcium Carbonate-Vitamin D (CALCIUM 600+D PO) Take 1 tablet by mouth daily.     . cholecalciferol 2000 UNITS tablet Take 1 capsule daily or as directed 90 tablet 7  . escitalopram (LEXAPRO) 5 MG tablet Take 5 mg by mouth as needed.     . ferrous sulfate 325 (65 FE) MG tablet Take 325 mg by mouth daily with breakfast.    . furosemide (LASIX) 20 MG tablet Take 20 mg by mouth daily.      Marland Kitchen letrozole (FEMARA) 2.5 MG tablet Take 1 tablet (2.5 mg total) by mouth daily. 90 tablet 3  . levothyroxine (SYNTHROID, LEVOTHROID) 50 MCG tablet Take 50 mcg by mouth daily.      . midodrine (PROAMATINE) 2.5 MG tablet TAKE 1 TABLET TWICE A DAY 180 tablet 2  . nitroGLYCERIN (NITRODUR - DOSED IN MG/24 HR) 0.1 mg/hr patch Place 0.1 mg onto the skin daily.   4  . silver sulfADIAZINE (SILVADENE) 1 % cream Apply 1 application topically daily.   13  . vitamin B-12 (CYANOCOBALAMIN) 500 MCG tablet Take 500 mcg by mouth daily. Takes  2 pills (1000mg ) daily    . WELCHOL 625 MG tablet Take 0.5 tablets by mouth daily.  2   No current facility-administered medications on file prior to visit.    Allergies  Allergen Reactions  . Erythromycin Hives  . Tape     BAND AIDS-SKIN IRRITATION    Past Medical History  Diagnosis Date  . Invasive ductal carcinoma of breast ([place]) 03/2007    BILATERAL BREASTS  . Cataract   . Hypercholesterolemia   . Thyroid disease   . Complete heart block (HCC)     s/p PPM implant (MDT) by Dr Blanch Media.  Atrial lead could not be paced at time of the procedure.  She has chronic AV dysociation  . Orthostatic hypotension     treated with midodrine by Dr Rollene [name]  . Exogenous obesity   . Venous insufficiency     Past Surgical History  Procedure Laterality Date  . Abdominal hysterectomy    . Cholecystectomy    . Cataract extraction      EYE SURGERY X 2  . Pacemaker insertion      MDT implanted by Dr Blanch Media.  Atrial lead placement was unsuccessful.  She has chronic AV dysociation  . Remote right radical nephrectomy  2009  . Breast lumpectomy Bilateral 2009  . Esophagogastroduodenoscopy N/A 02/16/2015  Procedure: ESOPHAGOGASTRODUODENOSCOPY (EGD);  Surgeon: Wilford Corner, MD;  Location: [facility] ENDOSCOPY;  Service: Endoscopy;  Laterality: N/A;  . Colonoscopy N/A 02/16/2015    Procedure: COLONOSCOPY;  Surgeon: Wilford Corner, MD;  Location: [facility] ENDOSCOPY;  Service: Endoscopy;  Laterality: N/A;  . Ep implantable device N/A 02/23/2015    Procedure: PPM Generator Changeout;  Surgeon: Will Meredith Leeds, MD;  Location: [place] CV LAB;  Service: Cardiovascular;  Laterality: N/A;    History  Smoking status  . Never Smoker   Smokeless tobacco  . Not on file    History  Alcohol Use No    Family History  Problem Relation Age of Onset  . Heart disease Maternal Grandmother   . Heart disease Mother     Review of Systems: As noted in history of present illness.  All other systems  were reviewed and are negative.  Physical Exam: BP 130/86 mmHg  Pulse 86  Ht 5' 7.5" (1.715 m)  Wt 101.061 kg (222 lb 12.8 oz)  BMI 34.36 kg/m2 She is a morbidly obese, elderly white female in no acute distress. HEENT: Normal Lungs: Clear Cardiovascular: Regular rate and rhythm without gallop, murmur, or click. Her pacemaker site in the left chest  is normal. Abdomen: Obese, soft, nontender. No masses or hepatosplenomegaly. Extremities: No cyanosis or edema. Pulses are 2+ and symmetric. No ulceration. Some hyperpigmentation right ankle. Skin: Warm and dry Neuro: Alert and oriented x3. Cranial nerves II through XII are intact.  LABORATORY DATA:   . Assessment / Plan: 1. History complete heart block status post permanent VVIR pacemaker placement in June of 2009. Revised 02/23/15. Clinically doing well. She is followed by Dr. Rayann Heman in the pacer clinic. I'll followup again in one year.  2. History of orthostatic hypotension-on midodrine.  3. Hyperlipidemia on statin therapy.  4. History of diverticular bleed.

## 2015-05-29 ENCOUNTER — Encounter: Payer: Medicare Other | Admitting: Internal Medicine

## 2015-05-30 ENCOUNTER — Telehealth: Payer: Self-pay

## 2015-05-30 NOTE — Telephone Encounter (Signed)
Returned call to patient no answer.[facility] about a message I received wanting Dr.Jordan to write a note okaying her to change will.

## 2015-05-31 ENCOUNTER — Telehealth: Payer: Self-pay

## 2015-05-31 NOTE — Telephone Encounter (Signed)
Patient called advised she will need to get note to change will from PCP.

## 2015-06-02 DIAGNOSIS — I83013 Varicose veins of right lower extremity with ulcer of ankle: Secondary | ICD-10-CM | POA: Diagnosis not present

## 2015-06-02 DIAGNOSIS — E11621 Type 2 diabetes mellitus with foot ulcer: Secondary | ICD-10-CM | POA: Diagnosis not present

## 2015-06-05 DIAGNOSIS — I442 Atrioventricular block, complete: Secondary | ICD-10-CM | POA: Diagnosis not present

## 2015-06-05 DIAGNOSIS — E11621 Type 2 diabetes mellitus with foot ulcer: Secondary | ICD-10-CM | POA: Diagnosis not present

## 2015-06-05 DIAGNOSIS — L97513 Non-pressure chronic ulcer of other part of right foot with necrosis of muscle: Secondary | ICD-10-CM | POA: Diagnosis not present

## 2015-06-05 DIAGNOSIS — I951 Orthostatic hypotension: Secondary | ICD-10-CM | POA: Diagnosis not present

## 2015-06-05 DIAGNOSIS — M810 Age-related osteoporosis without current pathological fracture: Secondary | ICD-10-CM | POA: Diagnosis not present

## 2015-06-05 DIAGNOSIS — B961 Klebsiella pneumoniae [K. pneumoniae] as the cause of diseases classified elsewhere: Secondary | ICD-10-CM | POA: Diagnosis not present

## 2015-06-07 ENCOUNTER — Emergency Department (HOSPITAL_COMMUNITY)
Admission: EM | Admit: 2015-06-07 | Discharge: 2015-06-07 | Disposition: A | Discharge status: Home / Self Care | Payer: Medicare Other | Attending: Emergency Medicine | Admitting: Emergency Medicine

## 2015-06-07 ENCOUNTER — Encounter (HOSPITAL_COMMUNITY): Payer: Self-pay

## 2015-06-07 ENCOUNTER — Emergency Department (HOSPITAL_COMMUNITY): Payer: Medicare Other

## 2015-06-07 DIAGNOSIS — E039 Hypothyroidism, unspecified: Secondary | ICD-10-CM | POA: Diagnosis not present

## 2015-06-07 DIAGNOSIS — E669 Obesity, unspecified: Secondary | ICD-10-CM | POA: Diagnosis not present

## 2015-06-07 DIAGNOSIS — L97512 Non-pressure chronic ulcer of other part of right foot with fat layer exposed: Secondary | ICD-10-CM

## 2015-06-07 DIAGNOSIS — E119 Type 2 diabetes mellitus without complications: Secondary | ICD-10-CM | POA: Diagnosis not present

## 2015-06-07 DIAGNOSIS — Z792 Long term (current) use of antibiotics: Secondary | ICD-10-CM | POA: Diagnosis not present

## 2015-06-07 DIAGNOSIS — M86371 Chronic multifocal osteomyelitis, right ankle and foot: Secondary | ICD-10-CM | POA: Diagnosis not present

## 2015-06-07 DIAGNOSIS — I252 Old myocardial infarction: Secondary | ICD-10-CM | POA: Insufficient documentation

## 2015-06-07 DIAGNOSIS — Z7982 Long term (current) use of aspirin: Secondary | ICD-10-CM | POA: Insufficient documentation

## 2015-06-07 DIAGNOSIS — Z853 Personal history of malignant neoplasm of breast: Secondary | ICD-10-CM | POA: Diagnosis not present

## 2015-06-07 DIAGNOSIS — Z950 Presence of cardiac pacemaker: Secondary | ICD-10-CM | POA: Insufficient documentation

## 2015-06-07 DIAGNOSIS — M216X2 Other acquired deformities of left foot: Secondary | ICD-10-CM | POA: Diagnosis not present

## 2015-06-07 DIAGNOSIS — M25571 Pain in right ankle and joints of right foot: Secondary | ICD-10-CM | POA: Diagnosis not present

## 2015-06-07 DIAGNOSIS — M79671 Pain in right foot: Secondary | ICD-10-CM

## 2015-06-07 DIAGNOSIS — L84 Corns and callosities: Secondary | ICD-10-CM | POA: Diagnosis not present

## 2015-06-07 DIAGNOSIS — M216X1 Other acquired deformities of right foot: Secondary | ICD-10-CM | POA: Diagnosis not present

## 2015-06-07 DIAGNOSIS — Z79899 Other long term (current) drug therapy: Secondary | ICD-10-CM | POA: Diagnosis not present

## 2015-06-07 DIAGNOSIS — L97519 Non-pressure chronic ulcer of other part of right foot with unspecified severity: Secondary | ICD-10-CM | POA: Diagnosis not present

## 2015-06-07 DIAGNOSIS — Z4801 Encounter for change or removal of surgical wound dressing: Secondary | ICD-10-CM | POA: Diagnosis present

## 2015-06-07 DIAGNOSIS — L602 Onychogryphosis: Secondary | ICD-10-CM | POA: Diagnosis not present

## 2015-06-07 LAB — I-STAT CHEM 8, ED
BUN: 23 mg/dL — AB (ref 6–20)
BUN: 32 mg/dL — ABNORMAL HIGH (ref 6–20)
CALCIUM ION: 1.12 mmol/L — AB (ref 1.13–1.30)
CHLORIDE: 109 mmol/L (ref 101–111)
CHLORIDE: 105 mmol/L (ref 101–111)
Calcium, Ion: 0.85 mmol/L — ABNORMAL LOW (ref 1.13–1.30)
Creatinine, Ser: 1.2 mg/dL — ABNORMAL HIGH (ref 0.44–1.00)
Creatinine, Ser: 1.2 mg/dL — ABNORMAL HIGH (ref 0.44–1.00)
GLUCOSE: 95 mg/dL (ref 65–99)
Glucose, Bld: 96 mg/dL (ref 65–99)
HCT: 49 % — ABNORMAL HIGH (ref 36.0–46.0)
HCT: 54 % — ABNORMAL HIGH (ref 36.0–46.0)
Hemoglobin: 16.7 g/dL — ABNORMAL HIGH (ref 12.0–15.0)
Hemoglobin: 18.4 g/dL — ABNORMAL HIGH (ref 12.0–15.0)
POTASSIUM: 6.5 mmol/L — AB (ref 3.5–5.1)
POTASSIUM: 3.7 mmol/L (ref 3.5–5.1)
SODIUM: 136 mmol/L (ref 135–145)
SODIUM: 143 mmol/L (ref 135–145)
TCO2: 24 mmol/L (ref 0–100)
TCO2: 26 mmol/L (ref 0–100)

## 2015-06-07 MED ORDER — IOHEXOL 300 MG/ML  SOLN
100.0000 mL | Freq: Once | INTRAMUSCULAR | Status: AC | PRN
  Administered 2015-06-07: 100 mL via INTRAVENOUS

## 2015-06-07 NOTE — ED Notes (Addendum)
Pt c/o R foot wound x 4 weeks increasing x 1.5 weeks.  Pain score 1/10.  Pt reports she was seen by PCP and sts "they want me to have a CT, because I have this heart pacer."  Pt has been treating wound w/ "silver cream."  Pt refuses to go to [place].  Sts she has had previous foot and leg wounds and was told they were caused by a vascular problem.

## 2015-06-07 NOTE — ED Provider Notes (Signed)
CSN: [id number]     Arrival date & time 06/07/15  1216 History   First MD Initiated Contact with Patient 06/07/15 1254     Chief Complaint  Patient presents with  . Wound Check     (Consider location/radiation/quality/duration/timing/severity/associated sxs/prior Treatment) Patient is a 82 y.o. female presenting with wound check. The history is provided by the patient.  Wound Check This is a chronic problem. The current episode started more than 2 days ago. The problem occurs constantly. The problem has been gradually worsening. Pertinent negatives include no chest pain, no abdominal pain, no headaches and no shortness of breath. Nothing aggravates the symptoms. Nothing relieves the symptoms. She has tried nothing for the symptoms. The treatment provided no relief.   82 yo F with a chief complaints of a chronic wound to the plantar aspect of her foot. She went to the podiatrist today and they wanted her to have a CT scan done. They're unable to get one scheduled and so told her to come to the emergency department physician get it done today. Patient denies any fevers or chills. Thinks that the wound is getting worse. Denies any pain or drainage. Patient is unable to get an MRI due to pacemaker  Past Medical History  Diagnosis Date  . Invasive ductal carcinoma of breast ([place]) 03/2007    BILATERAL BREASTS  . Cataract   . Hypercholesterolemia   . Thyroid disease   . Complete heart block (HCC)     s/p PPM implant (MDT) by Dr Blanch Media.  Atrial lead could not be paced at time of the procedure.  She has chronic AV dysociation  . Orthostatic hypotension     treated with midodrine by Dr Rollene [name]  . Exogenous obesity   . Venous insufficiency    Past Surgical History  Procedure Laterality Date  . Abdominal hysterectomy    . Cholecystectomy    . Cataract extraction      EYE SURGERY X 2  . Pacemaker insertion      MDT implanted by Dr Blanch Media.  Atrial lead placement was unsuccessful.  She has  chronic AV dysociation  . Remote right radical nephrectomy  2009  . Breast lumpectomy Bilateral 2009  . Esophagogastroduodenoscopy N/A 02/16/2015    Procedure: ESOPHAGOGASTRODUODENOSCOPY (EGD);  Surgeon: Wilford Corner, MD;  Location: [facility] ENDOSCOPY;  Service: Endoscopy;  Laterality: N/A;  . Colonoscopy N/A 02/16/2015    Procedure: COLONOSCOPY;  Surgeon: Wilford Corner, MD;  Location: [facility] ENDOSCOPY;  Service: Endoscopy;  Laterality: N/A;  . Ep implantable device N/A 02/23/2015    Procedure: PPM Generator Changeout;  Surgeon: Will Meredith Leeds, MD;  Location: [place] CV LAB;  Service: Cardiovascular;  Laterality: N/A;   Family History  Problem Relation Age of Onset  . Heart disease Maternal Grandmother   . Heart disease Mother    Social History  Substance Use Topics  . Smoking status: Never Smoker   . Smokeless tobacco: None  . Alcohol Use: No   OB History    No data available     Review of Systems  Constitutional: Negative for fever and chills.  HENT: Negative for congestion and rhinorrhea.   Eyes: Negative for redness and visual disturbance.  Respiratory: Negative for shortness of breath and wheezing.   Cardiovascular: Negative for chest pain and palpitations.  Gastrointestinal: Negative for nausea, vomiting and abdominal pain.  Genitourinary: Negative for dysuria and urgency.  Musculoskeletal: Negative for myalgias and arthralgias.  Skin: Positive for wound. Negative for pallor.  Neurological:  Negative for dizziness and headaches.      Allergies  Erythromycin and Tape  Home Medications   Prior to Admission medications   Medication Sig Start Date End Date Taking? Authorizing Provider  alendronate (FOSAMAX) 35 MG tablet TAKE 1 TABLET EVERY 7 DAYS WITH A FULL GLASS OF WATER ON AN EMPTY STOMACH Patient taking differently: TAKE 1 TABLET EVERY 7 DAYS WITH A FULL GLASS OF WATER ON AN EMPTY STOMACH- Monday 02/15/15  Yes [name], MD  aspirin 81 MG tablet Take 81  mg by mouth daily.   Yes Historical Provider, MD  Calcium Carbonate-Vitamin D (CALCIUM 600+D PO) Take 1 tablet by mouth daily.    Yes Historical Provider, MD  cholecalciferol 2000 UNITS tablet Take 1 capsule daily or as directed 09/14/12  Yes Consuela Mimes, MD  doxycycline (VIBRA-TABS) 100 MG tablet Take 100 mg by mouth 2 (two) times daily. for 10 days 06/02/15  Yes Historical Provider, MD  ferrous sulfate 325 (65 FE) MG tablet Take 325 mg by mouth daily with breakfast.   Yes Historical Provider, MD  furosemide (LASIX) 20 MG tablet Take 20 mg by mouth daily.     Yes Historical Provider, MD  letrozole (FEMARA) 2.5 MG tablet Take 1 tablet (2.5 mg total) by mouth daily. 12/26/14  Yes [name], MD  levothyroxine (SYNTHROID, LEVOTHROID) 50 MCG tablet Take 50 mcg by mouth daily.     Yes Historical Provider, MD  midodrine (PROAMATINE) 2.5 MG tablet TAKE 1 TABLET TWICE A DAY 10/10/14  Yes Thompson Grayer, MD  nitroGLYCERIN (NITRODUR - DOSED IN MG/24 HR) 0.1 mg/hr patch Place 0.1 mg onto the skin daily.  06/14/14  Yes Historical Provider, MD  silver sulfADIAZINE (SILVADENE) 1 % cream Apply 1 application topically daily.  04/19/14  Yes Historical Provider, MD  vitamin B-12 (CYANOCOBALAMIN) 500 MCG tablet Take 1,000 mcg by mouth daily. Takes 2 pills (1000mg ) daily   Yes Historical Provider, MD  [facility] 625 MG tablet Take 0.5 tablets by mouth daily. 01/13/14  Yes Historical Provider, MD   BP 167/97 mmHg  Pulse 93  Temp(Src) 98 F (36.7 C) (Oral)  Resp 20  SpO2 95% Physical Exam  Constitutional: She is oriented to person, place, and time. She appears well-developed and well-nourished. No distress.  HENT:  Head: Normocephalic and atraumatic.  Eyes: EOM are normal. Pupils are equal, round, and reactive to light.  Neck: Normal range of motion. Neck supple.  Cardiovascular: Normal rate and regular rhythm.  Exam reveals no gallop and no friction rub.   No murmur heard. Pulmonary/Chest: Effort normal. She has no  wheezes. She has no rales.  Abdominal: Soft. She exhibits no distension. There is no tenderness. There is no rebound and no guarding.  Musculoskeletal: She exhibits no edema or tenderness.       Feet:  Neurological: She is alert and oriented to person, place, and time.  Skin: Skin is warm and dry. She is not diaphoretic.  Psychiatric: She has a normal mood and affect. Her behavior is normal.  Nursing note and vitals reviewed.   ED Course  Procedures (including critical care time) Labs Review Labs Reviewed  I-STAT CHEM 8, ED - Abnormal; Notable for the following:    Potassium 6.5 (*)    BUN 32 (*)    Creatinine, Ser 1.20 (*)    Calcium, Ion 0.85 (*)    Hemoglobin 16.7 (*)    HCT 49.0 (*)    All other components within normal limits  I-STAT  CHEM 8, ED - Abnormal; Notable for the following:    BUN 23 (*)    Creatinine, Ser 1.20 (*)    Calcium, Ion 1.12 (*)    Hemoglobin 18.4 (*)    HCT 54.0 (*)    All other components within normal limits    Imaging Review No results found. I have personally reviewed and evaluated these images and lab results as part of my medical decision-making.   EKG Interpretation None      MDM   Final diagnoses:  Right foot pain  Chronic foot ulcer, right, with fat layer exposed ([place])    82 yo F with a chronic wound to the base of her foot. She is follow with her podiatrist in one week. CT scan was ordered.  CT without signs of osteo, will have her follow up with her podiatrist.    I have discussed the diagnosis/risks/treatment options with the patient and believe the pt to be eligible for discharge home to follow-up with Podiatrist. We also discussed returning to the ED immediately if new or worsening sx occur. We discussed the sx which are most concerning (e.g., sudden worsening pain, fever, inability to tolerate by mouth) that necessitate immediate return. Medications administered to the patient during their visit and any new prescriptions  provided to the patient are listed below.  Medications given during this visit Medications  iohexol (OMNIPAQUE) 300 MG/ML solution 100 mL (100 mLs Intravenous Contrast Given 06/07/15 1512)    Discharge Medication List as of 06/07/2015  3:47 PM      The patient appears reasonably screen and/or stabilized for discharge and I doubt any other medical condition or other [facility] requiring further screening, evaluation, or treatment in the ED at this time prior to discharge.    Deno Etienne, DO 06/09/15 2023

## 2015-06-07 NOTE — Discharge Instructions (Signed)
Diabetes and Foot Care Diabetes may cause you to have problems because of poor blood supply (circulation) to your feet and legs. This may cause the skin on your feet to become thinner, break easier, and heal more slowly. Your skin may become dry, and the skin may peel and crack. You may also have nerve damage in your legs and feet causing decreased feeling in them. You may not notice minor injuries to your feet that could lead to infections or more serious problems. Taking care of your feet is one of the most important things you can do for yourself.  HOME CARE INSTRUCTIONS  Wear shoes at all times, even in the house. Do not go barefoot. Bare feet are easily injured.  Check your feet daily for blisters, cuts, and redness. If you cannot see the bottom of your feet, use a mirror or ask someone for help.  Wash your feet with warm water (do not use hot water) and mild soap. Then pat your feet and the areas between your toes until they are completely dry. Do not soak your feet as this can dry your skin.  Apply a moisturizing lotion or petroleum jelly (that does not contain alcohol and is unscented) to the skin on your feet and to dry, brittle toenails. Do not apply lotion between your toes.  Trim your toenails straight across. Do not dig under them or around the cuticle. File the edges of your nails with an emery board or nail file.  Do not cut corns or calluses or try to remove them with medicine.  Wear clean socks or stockings every day. Make sure they are not too tight. Do not wear knee-high stockings since they may decrease blood flow to your legs.  Wear shoes that fit properly and have enough cushioning. To break in new shoes, wear them for just a few hours a day. This prevents you from injuring your feet. Always look in your shoes before you put them on to be sure there are no objects inside.  Do not cross your legs. This may decrease the blood flow to your feet.  If you find a minor scrape,  cut, or break in the skin on your feet, keep it and the skin around it clean and dry. These areas may be cleansed with mild soap and water. Do not cleanse the area with peroxide, alcohol, or iodine.  When you remove an adhesive bandage, be sure not to damage the skin around it.  If you have a wound, look at it several times a day to make sure it is healing.  Do not use heating pads or hot water bottles. They may burn your skin. If you have lost feeling in your feet or legs, you may not know it is happening until it is too late.  Make sure your health care provider performs a complete foot exam at least annually or more often if you have foot problems. Report any cuts, sores, or bruises to your health care provider immediately. SEEK MEDICAL CARE IF:   You have an injury that is not healing.  You have cuts or breaks in the skin.  You have an ingrown nail.  You notice redness on your legs or feet.  You feel burning or tingling in your legs or feet.  You have pain or cramps in your legs and feet.  Your legs or feet are numb.  Your feet always feel cold. SEEK IMMEDIATE MEDICAL CARE IF:   There is increasing redness,   swelling, or pain in or around a wound.  There is a red line that goes up your leg.  Pus is coming from a wound.  You develop a fever or as directed by your health care provider.  You notice a bad smell coming from an ulcer or wound.   This information is not intended to replace advice given to you by your health care provider. Make sure you discuss any questions you have with your health care provider.   Document Released: 04/12/2000 Document Revised: 12/16/2012 Document Reviewed: 09/22/2012 Elsevier Interactive Patient Education 2016 [employer]  

## 2015-06-07 NOTE — ED Notes (Signed)
MD Tyrone Nine made aware of ISTAT

## 2015-06-07 NOTE — ED Notes (Signed)
Patient transported to CT 

## 2015-06-09 DIAGNOSIS — M810 Age-related osteoporosis without current pathological fracture: Secondary | ICD-10-CM | POA: Diagnosis not present

## 2015-06-09 DIAGNOSIS — I442 Atrioventricular block, complete: Secondary | ICD-10-CM | POA: Diagnosis not present

## 2015-06-09 DIAGNOSIS — I951 Orthostatic hypotension: Secondary | ICD-10-CM | POA: Diagnosis not present

## 2015-06-09 DIAGNOSIS — L97513 Non-pressure chronic ulcer of other part of right foot with necrosis of muscle: Secondary | ICD-10-CM | POA: Diagnosis not present

## 2015-06-09 DIAGNOSIS — B961 Klebsiella pneumoniae [K. pneumoniae] as the cause of diseases classified elsewhere: Secondary | ICD-10-CM | POA: Diagnosis not present

## 2015-06-09 DIAGNOSIS — E11621 Type 2 diabetes mellitus with foot ulcer: Secondary | ICD-10-CM | POA: Diagnosis not present

## 2015-06-13 DIAGNOSIS — E039 Hypothyroidism, unspecified: Secondary | ICD-10-CM | POA: Diagnosis not present

## 2015-06-13 DIAGNOSIS — E559 Vitamin D deficiency, unspecified: Secondary | ICD-10-CM | POA: Diagnosis not present

## 2015-06-13 DIAGNOSIS — M8580 Other specified disorders of bone density and structure, unspecified site: Secondary | ICD-10-CM | POA: Diagnosis not present

## 2015-06-13 DIAGNOSIS — D649 Anemia, unspecified: Secondary | ICD-10-CM | POA: Diagnosis not present

## 2015-06-15 DIAGNOSIS — L97513 Non-pressure chronic ulcer of other part of right foot with necrosis of muscle: Secondary | ICD-10-CM | POA: Diagnosis not present

## 2015-06-15 DIAGNOSIS — I951 Orthostatic hypotension: Secondary | ICD-10-CM | POA: Diagnosis not present

## 2015-06-15 DIAGNOSIS — B961 Klebsiella pneumoniae [K. pneumoniae] as the cause of diseases classified elsewhere: Secondary | ICD-10-CM | POA: Diagnosis not present

## 2015-06-15 DIAGNOSIS — E11621 Type 2 diabetes mellitus with foot ulcer: Secondary | ICD-10-CM | POA: Diagnosis not present

## 2015-06-15 DIAGNOSIS — M810 Age-related osteoporosis without current pathological fracture: Secondary | ICD-10-CM | POA: Diagnosis not present

## 2015-06-15 DIAGNOSIS — I442 Atrioventricular block, complete: Secondary | ICD-10-CM | POA: Diagnosis not present

## 2015-06-19 DIAGNOSIS — L97513 Non-pressure chronic ulcer of other part of right foot with necrosis of muscle: Secondary | ICD-10-CM | POA: Diagnosis not present

## 2015-06-19 DIAGNOSIS — I442 Atrioventricular block, complete: Secondary | ICD-10-CM | POA: Diagnosis not present

## 2015-06-19 DIAGNOSIS — M810 Age-related osteoporosis without current pathological fracture: Secondary | ICD-10-CM | POA: Diagnosis not present

## 2015-06-19 DIAGNOSIS — E11621 Type 2 diabetes mellitus with foot ulcer: Secondary | ICD-10-CM | POA: Diagnosis not present

## 2015-06-19 DIAGNOSIS — I951 Orthostatic hypotension: Secondary | ICD-10-CM | POA: Diagnosis not present

## 2015-06-19 DIAGNOSIS — B961 Klebsiella pneumoniae [K. pneumoniae] as the cause of diseases classified elsewhere: Secondary | ICD-10-CM | POA: Diagnosis not present

## 2015-06-21 ENCOUNTER — Encounter: Payer: Medicare Other | Admitting: Internal Medicine

## 2015-06-22 DIAGNOSIS — I951 Orthostatic hypotension: Secondary | ICD-10-CM | POA: Diagnosis not present

## 2015-06-22 DIAGNOSIS — L97513 Non-pressure chronic ulcer of other part of right foot with necrosis of muscle: Secondary | ICD-10-CM | POA: Diagnosis not present

## 2015-06-22 DIAGNOSIS — B961 Klebsiella pneumoniae [K. pneumoniae] as the cause of diseases classified elsewhere: Secondary | ICD-10-CM | POA: Diagnosis not present

## 2015-06-22 DIAGNOSIS — E11621 Type 2 diabetes mellitus with foot ulcer: Secondary | ICD-10-CM | POA: Diagnosis not present

## 2015-06-22 DIAGNOSIS — M810 Age-related osteoporosis without current pathological fracture: Secondary | ICD-10-CM | POA: Diagnosis not present

## 2015-06-22 DIAGNOSIS — I442 Atrioventricular block, complete: Secondary | ICD-10-CM | POA: Diagnosis not present

## 2015-06-23 DIAGNOSIS — F419 Anxiety disorder, unspecified: Secondary | ICD-10-CM | POA: Diagnosis not present

## 2015-06-23 DIAGNOSIS — E039 Hypothyroidism, unspecified: Secondary | ICD-10-CM | POA: Diagnosis not present

## 2015-06-23 DIAGNOSIS — E11621 Type 2 diabetes mellitus with foot ulcer: Secondary | ICD-10-CM | POA: Diagnosis not present

## 2015-06-23 DIAGNOSIS — L98499 Non-pressure chronic ulcer of skin of other sites with unspecified severity: Secondary | ICD-10-CM | POA: Diagnosis not present

## 2015-06-27 DIAGNOSIS — B961 Klebsiella pneumoniae [K. pneumoniae] as the cause of diseases classified elsewhere: Secondary | ICD-10-CM | POA: Diagnosis not present

## 2015-06-27 DIAGNOSIS — I951 Orthostatic hypotension: Secondary | ICD-10-CM | POA: Diagnosis not present

## 2015-06-27 DIAGNOSIS — E11621 Type 2 diabetes mellitus with foot ulcer: Secondary | ICD-10-CM | POA: Diagnosis not present

## 2015-06-27 DIAGNOSIS — I442 Atrioventricular block, complete: Secondary | ICD-10-CM | POA: Diagnosis not present

## 2015-06-27 DIAGNOSIS — L97513 Non-pressure chronic ulcer of other part of right foot with necrosis of muscle: Secondary | ICD-10-CM | POA: Diagnosis not present

## 2015-06-27 DIAGNOSIS — M810 Age-related osteoporosis without current pathological fracture: Secondary | ICD-10-CM | POA: Diagnosis not present

## 2015-06-29 DIAGNOSIS — M810 Age-related osteoporosis without current pathological fracture: Secondary | ICD-10-CM | POA: Diagnosis not present

## 2015-06-29 DIAGNOSIS — I951 Orthostatic hypotension: Secondary | ICD-10-CM | POA: Diagnosis not present

## 2015-06-29 DIAGNOSIS — E11621 Type 2 diabetes mellitus with foot ulcer: Secondary | ICD-10-CM | POA: Diagnosis not present

## 2015-06-29 DIAGNOSIS — L97513 Non-pressure chronic ulcer of other part of right foot with necrosis of muscle: Secondary | ICD-10-CM | POA: Diagnosis not present

## 2015-06-29 DIAGNOSIS — I442 Atrioventricular block, complete: Secondary | ICD-10-CM | POA: Diagnosis not present

## 2015-06-29 DIAGNOSIS — B961 Klebsiella pneumoniae [K. pneumoniae] as the cause of diseases classified elsewhere: Secondary | ICD-10-CM | POA: Diagnosis not present

## 2015-07-04 ENCOUNTER — Emergency Department (HOSPITAL_COMMUNITY)
Admission: EM | Admit: 2015-07-04 | Discharge: 2015-07-06 | Payer: Medicare Other | Attending: Emergency Medicine | Admitting: Emergency Medicine

## 2015-07-04 ENCOUNTER — Emergency Department (HOSPITAL_COMMUNITY): Payer: Medicare Other

## 2015-07-04 DIAGNOSIS — F4321 Adjustment disorder with depressed mood: Secondary | ICD-10-CM | POA: Diagnosis not present

## 2015-07-04 DIAGNOSIS — Z8679 Personal history of other diseases of the circulatory system: Secondary | ICD-10-CM | POA: Insufficient documentation

## 2015-07-04 DIAGNOSIS — E669 Obesity, unspecified: Secondary | ICD-10-CM | POA: Diagnosis not present

## 2015-07-04 DIAGNOSIS — Z8639 Personal history of other endocrine, nutritional and metabolic disease: Secondary | ICD-10-CM | POA: Diagnosis not present

## 2015-07-04 DIAGNOSIS — E079 Disorder of thyroid, unspecified: Secondary | ICD-10-CM | POA: Insufficient documentation

## 2015-07-04 DIAGNOSIS — Z008 Encounter for other general examination: Secondary | ICD-10-CM | POA: Diagnosis present

## 2015-07-04 DIAGNOSIS — F29 Unspecified psychosis not due to a substance or known physiological condition: Secondary | ICD-10-CM | POA: Diagnosis not present

## 2015-07-04 DIAGNOSIS — R4182 Altered mental status, unspecified: Secondary | ICD-10-CM | POA: Diagnosis not present

## 2015-07-04 DIAGNOSIS — Z853 Personal history of malignant neoplasm of breast: Secondary | ICD-10-CM | POA: Diagnosis not present

## 2015-07-04 DIAGNOSIS — F22 Delusional disorders: Secondary | ICD-10-CM | POA: Diagnosis not present

## 2015-07-04 DIAGNOSIS — F99 Mental disorder, not otherwise specified: Secondary | ICD-10-CM | POA: Diagnosis not present

## 2015-07-04 DIAGNOSIS — Z634 Disappearance and death of family member: Secondary | ICD-10-CM | POA: Diagnosis not present

## 2015-07-04 DIAGNOSIS — Z79899 Other long term (current) drug therapy: Secondary | ICD-10-CM | POA: Diagnosis not present

## 2015-07-04 DIAGNOSIS — Z8669 Personal history of other diseases of the nervous system and sense organs: Secondary | ICD-10-CM | POA: Insufficient documentation

## 2015-07-04 DIAGNOSIS — R0602 Shortness of breath: Secondary | ICD-10-CM | POA: Diagnosis not present

## 2015-07-04 DIAGNOSIS — Z792 Long term (current) use of antibiotics: Secondary | ICD-10-CM | POA: Diagnosis not present

## 2015-07-04 DIAGNOSIS — Z7982 Long term (current) use of aspirin: Secondary | ICD-10-CM | POA: Insufficient documentation

## 2015-07-04 DIAGNOSIS — F4320 Adjustment disorder, unspecified: Secondary | ICD-10-CM

## 2015-07-04 LAB — ETHANOL: Alcohol, Ethyl (B): <5 mg/dL (ref <5)

## 2015-07-04 LAB — URINALYSIS, ROUTINE W REFLEX MICROSCOPIC
BILIRUBIN URINE: NEGATIVE
Glucose, UA: NEGATIVE mg/dL
HGB URINE DIPSTICK: NEGATIVE
Ketones, ur: NEGATIVE mg/dL
Leukocytes, UA: NEGATIVE
Nitrite: NEGATIVE
PH: 6.5 (ref 5.0–8.0)
Protein, ur: NEGATIVE mg/dL
SPECIFIC GRAVITY, URINE: 1.008 (ref 1.005–1.030)

## 2015-07-04 LAB — COMPREHENSIVE METABOLIC PANEL
ALBUMIN: 3.7 g/dL (ref 3.5–5.0)
ALK PHOS: 86 U/L (ref 38–126)
ALT: 16 U/L (ref 14–54)
ANION GAP: 8 (ref 5–15)
AST: 21 U/L (ref 15–41)
BUN: 16 mg/dL (ref 6–20)
CALCIUM: 8.6 mg/dL — AB (ref 8.9–10.3)
CO2: 28 mmol/L (ref 22–32)
Chloride: 105 mmol/L (ref 101–111)
Creatinine, Ser: 1.25 mg/dL — ABNORMAL HIGH (ref 0.44–1.00)
GFR calc Af Amer: 44 mL/min — ABNORMAL LOW (ref >60)
GFR calc non Af Amer: 38 mL/min — ABNORMAL LOW (ref >60)
GLUCOSE: 99 mg/dL (ref 65–99)
POTASSIUM: 3.5 mmol/L (ref 3.5–5.1)
SODIUM: 141 mmol/L (ref 135–145)
Total Bilirubin: 0.6 mg/dL (ref 0.3–1.2)
Total Protein: 6.7 g/dL (ref 6.5–8.1)

## 2015-07-04 LAB — CBC WITH DIFFERENTIAL/PLATELET
Basophils Absolute: 0 10*3/uL (ref 0.0–0.1)
Basophils Relative: 0 %
Eosinophils Absolute: 0.3 10*3/uL (ref 0.0–0.7)
Eosinophils Relative: 3 %
HEMATOCRIT: 46.6 % — AB (ref 36.0–46.0)
HEMOGLOBIN: 14.8 g/dL (ref 12.0–15.0)
LYMPHS ABS: 1 10*3/uL (ref 0.7–4.0)
Lymphocytes Relative: 10 %
MCH: 29.9 pg (ref 26.0–34.0)
MCHC: 31.8 g/dL (ref 30.0–36.0)
MCV: 94.1 fL (ref 78.0–100.0)
MONO ABS: 0.9 10*3/uL (ref 0.1–1.0)
MONOS PCT: 9 %
NEUTROS ABS: 7.6 10*3/uL (ref 1.7–7.7)
NEUTROS PCT: 78 %
Platelets: 233 10*3/uL (ref 150–400)
RBC: 4.95 MIL/uL (ref 3.87–5.11)
RDW: 15.2 % (ref 11.5–15.5)
WBC: 9.8 10*3/uL (ref 4.0–10.5)

## 2015-07-04 LAB — ACETAMINOPHEN LEVEL: Acetaminophen (Tylenol), Serum: <10 ug/mL — ABNORMAL LOW (ref 10–30)

## 2015-07-04 LAB — RAPID URINE DRUG SCREEN, HOSP PERFORMED
Amphetamines: NOT DETECTED
BARBITURATES: NOT DETECTED
Benzodiazepines: NOT DETECTED
Cocaine: NOT DETECTED
Opiates: NOT DETECTED
TETRAHYDROCANNABINOL: NOT DETECTED

## 2015-07-04 LAB — SALICYLATE LEVEL: Salicylate Lvl: <4 mg/dL (ref 2.8–30.0)

## 2015-07-04 NOTE — ED Notes (Signed)
Ambulated to bathroom

## 2015-07-04 NOTE — ED Notes (Signed)
Bed: [id number] Expected date:  Expected time:  Means of arrival:  Comments: 82 yr old psych eval

## 2015-07-04 NOTE — ED Notes (Signed)
Pt transported to TCU. Pt wearing necklace, watch and rings. Charge nurse aware. Pt denies homicidal/suicidal ideation.

## 2015-07-04 NOTE — ED Notes (Signed)
Bed: [id number] Expected date:  Expected time:  Means of arrival:  Comments: 27

## 2015-07-04 NOTE — ED Notes (Signed)
Patient does not want [name] to have any information about her
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
## 2015-07-04 NOTE — ED Provider Notes (Signed)
CSN: [id number]     Arrival date & time 07/04/15  [id number] History   First MD Initiated Contact with Patient 07/04/15 0730     Chief Complaint  Patient presents with  . Psychiatric Evaluation     (Consider location/radiation/quality/duration/timing/severity/associated sxs/prior Treatment) Patient is a 82 y.o. female presenting with mental health disorder. The history is provided by the patient and a friend. No language interpreter was used.  Mental Health Problem Presenting symptoms: bizarre behavior, delusional and paranoid behavior   Presenting symptoms: no suicidal thoughts, no suicidal threats and no suicide attempt   Patient accompanied by:  Child Onset quality:  Gradual Duration:  3 months Timing:  Constant Chronicity:  New Context: not alcohol use, not drug abuse, not medication and not recent medication change   Treatment compliance:  Untreated Relieved by:  None tried Worsened by:  Nothing tried Ineffective treatments:  None tried Associated symptoms: no abdominal pain, no anxiety, no chest pain, no decreased need for sleep, not distractible, no euphoric mood and no fatigue     Past Medical History  Diagnosis Date  . Invasive ductal carcinoma of breast ([place]) 03/2007    BILATERAL BREASTS  . Cataract   . Hypercholesterolemia   . Thyroid disease   . Complete heart block (HCC)     s/p PPM implant (MDT) by Dr Blanch Media.  Atrial lead could not be paced at time of the procedure.  She has chronic AV dysociation  . Orthostatic hypotension     treated with midodrine by Dr Rollene [name]  . Exogenous obesity   . Venous insufficiency    Past Surgical History  Procedure Laterality Date  . Abdominal hysterectomy    . Cholecystectomy    . Cataract extraction      EYE SURGERY X 2  . Pacemaker insertion      MDT implanted by Dr Blanch Media.  Atrial lead placement was unsuccessful.  She has chronic AV dysociation  . Remote right radical nephrectomy  2009  . Breast lumpectomy Bilateral 2009   . Esophagogastroduodenoscopy N/A 02/16/2015    Procedure: ESOPHAGOGASTRODUODENOSCOPY (EGD);  Surgeon: Wilford Corner, MD;  Location: [facility] ENDOSCOPY;  Service: Endoscopy;  Laterality: N/A;  . Colonoscopy N/A 02/16/2015    Procedure: COLONOSCOPY;  Surgeon: Wilford Corner, MD;  Location: [facility] ENDOSCOPY;  Service: Endoscopy;  Laterality: N/A;  . Ep implantable device N/A 02/23/2015    Procedure: PPM Generator Changeout;  Surgeon: Will Meredith Leeds, MD;  Location: [place] CV LAB;  Service: Cardiovascular;  Laterality: N/A;   Family History  Problem Relation Age of Onset  . Heart disease Maternal Grandmother   . Heart disease Mother    Social History  Substance Use Topics  . Smoking status: Never Smoker   . Smokeless tobacco: Not on file  . Alcohol Use: No   OB History    No data available     Review of Systems  Constitutional: Negative for chills and fatigue.  HENT: Negative for congestion and drooling.   Eyes: Negative for pain.  Respiratory: Negative for cough and shortness of breath.   Cardiovascular: Negative for chest pain.  Gastrointestinal: Negative for abdominal pain.  Neurological: Negative for seizures, syncope and numbness.  Psychiatric/Behavioral: Positive for behavioral problems and paranoia. Negative for suicidal ideas. The patient is not nervous/anxious.   All other systems reviewed and are negative.     Allergies  Erythromycin and Tape  Home Medications   Prior to Admission medications   Medication Sig Start Date End Date Taking?  Authorizing Provider  alendronate (FOSAMAX) 35 MG tablet TAKE 1 TABLET EVERY 7 DAYS WITH A FULL GLASS OF WATER ON AN EMPTY STOMACH Patient taking differently: TAKE 1 TABLET EVERY 7 DAYS WITH A FULL GLASS OF WATER ON AN EMPTY STOMACH- Monday 02/15/15  Yes [name], MD  aspirin 81 MG tablet Take 81 mg by mouth daily.   Yes Historical Provider, MD  Calcium Carbonate-Vitamin D (CALCIUM 600+D PO) Take 1 tablet by mouth daily.     Yes Historical Provider, MD  cholecalciferol 2000 UNITS tablet Take 1 capsule daily or as directed 09/14/12  Yes Consuela Mimes, MD  doxycycline (VIBRA-TABS) 100 MG tablet Take 100 mg by mouth daily. for 10 days 06/02/15  Yes Historical Provider, MD  ferrous sulfate 325 (65 FE) MG tablet Take 325 mg by mouth daily with breakfast.   Yes Historical Provider, MD  furosemide (LASIX) 20 MG tablet Take 20 mg by mouth daily.     Yes Historical Provider, MD  lactobacillus acidophilus (BACID) TABS tablet Take 1 tablet by mouth daily. For 14 days   Yes Historical Provider, MD  letrozole (FEMARA) 2.5 MG tablet Take 1 tablet (2.5 mg total) by mouth daily. 12/26/14  Yes [name], MD  levothyroxine (SYNTHROID, LEVOTHROID) 50 MCG tablet Take 50 mcg by mouth daily.     Yes Historical Provider, MD  midodrine (PROAMATINE) 2.5 MG tablet TAKE 1 TABLET TWICE A DAY 10/10/14  Yes Thompson Grayer, MD  nitroGLYCERIN (NITRODUR - DOSED IN MG/24 HR) 0.1 mg/hr patch Place 0.1 mg onto the skin daily.  06/14/14  Yes Historical Provider, MD  silver sulfADIAZINE (SILVADENE) 1 % cream Apply 1 application topically daily.  04/19/14  Yes Historical Provider, MD  vitamin B-12 (CYANOCOBALAMIN) 500 MCG tablet Take 1,000 mcg by mouth daily. Takes 2 pills (1000mg ) daily   Yes Historical Provider, MD  [facility] 625 MG tablet Take 0.5 tablets by mouth daily. 01/13/14  Yes Historical Provider, MD   BP 125/76 mmHg  Pulse 65  Temp(Src) 98 F (36.7 C) (Oral)  Resp 18  SpO2 99% Physical Exam  Constitutional: She is oriented to person, place, and time. She appears well-developed and well-nourished.  HENT:  Head: Normocephalic and atraumatic.  Neck: Normal range of motion.  Cardiovascular: Normal rate and regular rhythm.   Pulmonary/Chest: Effort normal and breath sounds normal. No stridor. No respiratory distress.  Abdominal: She exhibits no distension.  Musculoskeletal: Normal range of motion. She exhibits no edema or tenderness.   Neurological: She is alert and oriented to person, place, and time.  Psychiatric: Her mood appears not anxious. Her affect is labile. Thought content is paranoid and delusional. She does not exhibit a depressed mood. She expresses no homicidal and no suicidal ideation. She expresses no suicidal plans and no homicidal plans.  Nursing note and vitals reviewed.   ED Course  Procedures (including critical care time) Labs Review Labs Reviewed  COMPREHENSIVE METABOLIC PANEL - Abnormal; Notable for the following:    Creatinine, Ser 1.25 (*)    Calcium 8.6 (*)    GFR calc non Af Amer 38 (*)    GFR calc Af Amer 44 (*)    All other components within normal limits  CBC WITH DIFFERENTIAL/PLATELET - Abnormal; Notable for the following:    HCT 46.6 (*)    All other components within normal limits  ACETAMINOPHEN LEVEL - Abnormal; Notable for the following:    Acetaminophen (Tylenol), Serum <10 (*)    All other components within normal  limits  ETHANOL  URINE RAPID DRUG SCREEN, HOSP PERFORMED  SALICYLATE LEVEL  URINALYSIS, ROUTINE W REFLEX MICROSCOPIC (NOT AT [facility])    Imaging Review Dg Chest 2 View  07/04/2015  CLINICAL DATA:  Short of breath EXAM: CHEST  2 VIEW COMPARISON:  04/12/2014 FINDINGS: Stable single lead left subclavian pacemaker. Moderate cardiomegaly. Normal vascularity. Linear atelectasis for scar in the lower lung zones bilaterally. No consolidation or mass. IMPRESSION: Cardiomegaly without decompensation. Electronically Signed   By: Marybelle Killings M.D.   On: 07/04/2015 08:27   Ct Head Wo Contrast  07/04/2015  CLINICAL DATA:  82 year old with altered mental status. History of breast cancer. Psychiatric evaluation. EXAM: CT HEAD WITHOUT CONTRAST TECHNIQUE: Contiguous axial images were obtained from the base of the skull through the vertex without intravenous contrast. COMPARISON:  None. FINDINGS: Brain: There is no evidence of acute intracranial hemorrhage, mass lesion, brain edema or  extra-axial fluid collection. The ventricles and subarachnoid spaces are mildly prominent, consistent with mild atrophy. There is no CT evidence of acute cortical infarction. There are mild chronic small vessel ischemic changes in the periventricular white matter and basal ganglia bilaterally. Intracranial vascular calcifications are noted. Bones/sinuses/visualized face: There are postsurgical changes status post medial antrostomy and partial ethmoidectomy on the right. The visualized paranasal sinuses, mastoid air cells and middle ears are otherwise clear. The calvarium is intact. IMPRESSION: 1. No acute intracranial findings demonstrated. 2. Mild atrophy and chronic small vessel ischemic changes. 3. Postsurgical changes in the paranasal sinuses. Electronically Signed   By: Richardean Sale M.D.   On: 07/04/2015 08:21   I have personally reviewed and evaluated these images and lab results as part of my medical decision-making.   EKG Interpretation None      MDM   Final diagnoses:  Psychosis, unspecified psychosis type    Here with delusions and paranoia that have developed over last few days. No other s/s of psychosis. A&o otherwise. Medically cleared from infection or other causes. TTS consulted and will reeval in the AM. Patient is safe for discharge home, however has alienated almost everyone 2/2 delusional and paranoid behavior.       Merrily Pew, MD 07/04/15 [phone]

## 2015-07-05 ENCOUNTER — Encounter (HOSPITAL_COMMUNITY): Payer: Self-pay | Admitting: *Deleted

## 2015-07-05 DIAGNOSIS — F4320 Adjustment disorder, unspecified: Secondary | ICD-10-CM

## 2015-07-05 DIAGNOSIS — F4321 Adjustment disorder with depressed mood: Secondary | ICD-10-CM | POA: Diagnosis not present

## 2015-07-05 DIAGNOSIS — F29 Unspecified psychosis not due to a substance or known physiological condition: Secondary | ICD-10-CM | POA: Diagnosis not present

## 2015-07-05 DIAGNOSIS — F22 Delusional disorders: Secondary | ICD-10-CM | POA: Diagnosis not present

## 2015-07-05 DIAGNOSIS — Z634 Disappearance and death of family member: Secondary | ICD-10-CM | POA: Diagnosis not present

## 2015-07-05 MED ORDER — CALCIUM CARBONATE-VITAMIN D 500-200 MG-UNIT PO TABS
ORAL_TABLET | Freq: Every day | ORAL | Status: DC
  Administered 2015-07-05: 14:00:00 via ORAL
  Filled 2015-07-05 (×2): qty 1

## 2015-07-05 MED ORDER — VITAMIN B-12 1000 MCG PO TABS
1000.0000 ug | ORAL_TABLET | Freq: Every day | ORAL | Status: DC
  Administered 2015-07-05: 1000 ug via ORAL
  Filled 2015-07-05 (×2): qty 1

## 2015-07-05 MED ORDER — FERROUS SULFATE 325 (65 FE) MG PO TABS
325.0000 mg | ORAL_TABLET | Freq: Every day | ORAL | Status: DC
  Administered 2015-07-06: 325 mg via ORAL
  Filled 2015-07-05 (×2): qty 1

## 2015-07-05 MED ORDER — NITROGLYCERIN 0.1 MG/HR TD PT24
0.1000 mg | MEDICATED_PATCH | Freq: Every day | TRANSDERMAL | Status: DC
  Administered 2015-07-05: 0.1 mg via TRANSDERMAL
  Filled 2015-07-05 (×2): qty 1

## 2015-07-05 MED ORDER — LEVOTHYROXINE SODIUM 50 MCG PO TABS
50.0000 ug | ORAL_TABLET | Freq: Every day | ORAL | Status: DC
  Administered 2015-07-05 – 2015-07-06 (×2): 50 ug via ORAL
  Filled 2015-07-05 (×3): qty 1

## 2015-07-05 MED ORDER — FUROSEMIDE 40 MG PO TABS
20.0000 mg | ORAL_TABLET | Freq: Every day | ORAL | Status: DC
  Administered 2015-07-05: 20 mg via ORAL
  Filled 2015-07-05: qty 1

## 2015-07-05 MED ORDER — MIDODRINE HCL 2.5 MG PO TABS
2.5000 mg | ORAL_TABLET | Freq: Two times a day (BID) | ORAL | Status: DC
  Administered 2015-07-05 – 2015-07-06 (×2): 2.5 mg via ORAL
  Filled 2015-07-05 (×5): qty 1

## 2015-07-05 MED ORDER — ASPIRIN 81 MG PO CHEW
81.0000 mg | CHEWABLE_TABLET | Freq: Every day | ORAL | Status: DC
  Administered 2015-07-05: 81 mg via ORAL
  Filled 2015-07-05: qty 1

## 2015-07-05 NOTE — Consult Note (Signed)
[facility] Face-to-Face Psychiatry Consult   Reason for Consult:  Delusion, Anxiety. Referring Physician:  EDP Patient Identification: [name] MRN:  [id number] Principal Diagnosis: Delusional disorder [facility]) Diagnosis:   Patient Active Problem List   Diagnosis Date Noted  . Delusional disorder ([place]) [F22] 07/05/2015  . Bereavement reaction [F43.21, Z63.4] 07/05/2015  . Pacemaker at end of battery life [Z45.010] 02/23/2015  . CHB (complete heart block) ([place]) [I44.2]   . Acute GI bleeding [K92.2] 02/15/2015  . Acute kidney injury (nontraumatic) ([place]) [N17.9] 02/15/2015  . Hypothyroidism [E03.9] 02/15/2015  . Left upper quadrant pain [R10.12]   . Osteopenia [M85.80] 12/22/2013  . Postural dizziness [R42] 04/05/2013  . Complete heart block ([place]) [I44.2] 02/26/2013  . S/P placement of cardiac pacemaker [Z95.0] 02/26/2013  . Bilateral breast cancer ([place]) [C50.911, C50.912] 03/25/2011    Total Time spent with patient: 45 minutes  Subjective:   [name] is a 82 y.o. female patient admitted with Delusion, Anxiety.  HPI:   Caucasian female, 82 years old was evaluated today for delusional thought  And anxiety.  Patient have never been seen by a Psychiatrist before and is not taking Psychotropic medications.  Patient states that she is angry at family members that live close to her because they come and steal her food and leave her with Cheese with mould.  She admits that she is suspicious that People comes into her house even after changing her Locks.  Patient reports missing her husband who passed away 07/06/2013.  Patient reports that her neighbors called in the Police because she was having issues with her ADT alarm.  Patient states that she is ready to move into an assisted living facility.  She denies SI/HI/AVH.    Patient reports missing her deceased husband.  Patient is accepted for admission and we are seeking placement at any facility with available bed.  Past Psychiatric History:   None, denies  Risk to Self: Suicidal Ideation: No Suicidal Intent: No Is patient at risk for suicide?: No Suicidal Plan?: No Access to Means: No What has been your use of drugs/alcohol within the last 12 months?:  (n/a) How many times?:  (n/a) Other Self Harm Risks:  (n/a) Triggers for Past Attempts:  (no previous attempts or gestures) Intentional Self Injurious Behavior: None Risk to Others: Homicidal Ideation: No Thoughts of Harm to Others: No Current Homicidal Intent: No Current Homicidal Plan: No Access to Homicidal Means: No Identified Victim:  (n/a) History of harm to others?: No Assessment of Violence: None Noted Violent Behavior Description:  (patient is calm and cooperative ) Does patient have access to weapons?: No Criminal Charges Pending?: No Does patient have a court date: No Prior Inpatient Therapy: Prior Inpatient Therapy: No Prior Therapy Dates:  (n/a) Prior Therapy Facilty/Provider(s):  (n/a) Reason for Treatment:  (n/a) Prior Outpatient Therapy: Prior Outpatient Therapy: No Prior Therapy Dates:  (n/a) Prior Therapy Facilty/Provider(s):  (n/a) Reason for Treatment:  (n/a) Does patient have an ACCT team?: No Does patient have Intensive In-House Services?  : No Does patient have [facility] services? : No Does patient have P4CC services?: No  Past Medical History:  Past Medical History  Diagnosis Date  . Invasive ductal carcinoma of breast ([place]) 03/2007    BILATERAL BREASTS  . Cataract   . Hypercholesterolemia   . Thyroid disease   . Complete heart block (HCC)     s/p PPM implant (MDT) by Dr Blanch Media.  Atrial lead could not be paced at time of the  procedure.  She has chronic AV dysociation  . Orthostatic hypotension     treated with midodrine by Dr Rollene [name]  . Exogenous obesity   . Venous insufficiency     Past Surgical History  Procedure Laterality Date  . Abdominal hysterectomy    . Cholecystectomy    . Cataract extraction      EYE SURGERY X 2  .  Pacemaker insertion      MDT implanted by Dr Blanch Media.  Atrial lead placement was unsuccessful.  She has chronic AV dysociation  . Remote right radical nephrectomy  2009  . Breast lumpectomy Bilateral 2009  . Esophagogastroduodenoscopy N/A 02/16/2015    Procedure: ESOPHAGOGASTRODUODENOSCOPY (EGD);  Surgeon: Wilford Corner, MD;  [facility]: [facility] ENDOSCOPY;  Service: Endoscopy;  Laterality: N/A;  . Colonoscopy N/A 02/16/2015    Procedure: COLONOSCOPY;  Surgeon: Wilford Corner, MD;  Location: [facility] ENDOSCOPY;  Service: Endoscopy;  Laterality: N/A;  . Ep implantable device N/A 02/23/2015    Procedure: PPM Generator Changeout;  Surgeon: Will Meredith Leeds, MD;  Location: [place] CV LAB;  Service: Cardiovascular;  Laterality: N/A;   Family History:  Family History  Problem Relation Age of Onset  . Heart disease Maternal Grandmother   . Heart disease Mother    Family Psychiatric  History:  Denies Social History:  History  Alcohol Use No     History  Drug Use No    Social History   Social History  . Marital Status: Married    Spouse Name: N/A  . Number of Children: N/A  . Years of Education: N/A   Social History Main Topics  . Smoking status: Never Smoker   . Smokeless tobacco: Not on file  . Alcohol Use: No  . Drug Use: No  . Sexual Activity: Not Currently   Other Topics Concern  . Not on file   Social History Narrative   Additional Social History:    Allergies:   Allergies  Allergen Reactions  . Erythromycin Hives  . Tape     BAND AIDS-SKIN IRRITATION    Labs:  Results for orders placed or performed during the hospital encounter of 07/04/15 (from the past 48 hour(s))  Urine rapid drug screen (hosp performed)not at [facility]     Status: None   Collection Time: 07/04/15  9:12 AM  Result Value Ref Range   Opiates NONE DETECTED NONE DETECTED   Cocaine NONE DETECTED NONE DETECTED   Benzodiazepines NONE DETECTED NONE DETECTED   Amphetamines NONE DETECTED NONE DETECTED    Tetrahydrocannabinol NONE DETECTED NONE DETECTED   Barbiturates NONE DETECTED NONE DETECTED    Comment:        DRUG SCREEN FOR MEDICAL PURPOSES ONLY.  IF CONFIRMATION IS NEEDED FOR ANY PURPOSE, NOTIFY LAB WITHIN 5 DAYS.        LOWEST DETECTABLE LIMITS FOR URINE DRUG SCREEN Drug Class       Cutoff (ng/mL) Amphetamine      1000 Barbiturate      200 Benzodiazepine   588 Tricyclics       325 Opiates          300 Cocaine          300 THC              50   Urinalysis, Routine w reflex microscopic (not at [facility])     Status: None   Collection Time: 07/04/15  9:12 AM  Result Value Ref Range   Color, Urine YELLOW YELLOW   APPearance  CLEAR CLEAR   Specific Gravity, Urine 1.008 1.005 - 1.030   pH 6.5 5.0 - 8.0   Glucose, UA NEGATIVE NEGATIVE mg/dL   Hgb urine dipstick NEGATIVE NEGATIVE   Bilirubin Urine NEGATIVE NEGATIVE   Ketones, ur NEGATIVE NEGATIVE mg/dL   Protein, ur NEGATIVE NEGATIVE mg/dL   Nitrite NEGATIVE NEGATIVE   Leukocytes, UA NEGATIVE NEGATIVE    Comment: MICROSCOPIC NOT DONE ON URINES WITH NEGATIVE PROTEIN, BLOOD, LEUKOCYTES, NITRITE, OR GLUCOSE <1000 mg/dL.  Comprehensive metabolic panel     Status: Abnormal   Collection Time: 07/04/15  9:23 AM  Result Value Ref Range   Sodium 141 135 - 145 mmol/L   Potassium 3.5 3.5 - 5.1 mmol/L   Chloride 105 101 - 111 mmol/L   CO2 28 22 - 32 mmol/L   Glucose, Bld 99 65 - 99 mg/dL   BUN 16 6 - 20 mg/dL   Creatinine, Ser 1.25 (H) 0.44 - 1.00 mg/dL   Calcium 8.6 (L) 8.9 - 10.3 mg/dL   Total Protein 6.7 6.5 - 8.1 g/dL   Albumin 3.7 3.5 - 5.0 g/dL   AST 21 15 - 41 U/L   ALT 16 14 - 54 U/L   Alkaline Phosphatase 86 38 - 126 U/L   Total Bilirubin 0.6 0.3 - 1.2 mg/dL   GFR calc non Af Amer 38 (L) >60 mL/min   GFR calc Af Amer 44 (L) >60 mL/min    Comment: (NOTE) The eGFR has been calculated using the CKD EPI equation. This calculation has not been validated in all clinical situations. eGFR's persistently <60 mL/min signify  possible Chronic Kidney Disease.    Anion gap 8 5 - 15  Ethanol     Status: None   Collection Time: 07/04/15  9:23 AM  Result Value Ref Range   Alcohol, Ethyl (B) <5 <5 mg/dL    Comment:        LOWEST DETECTABLE LIMIT FOR SERUM ALCOHOL IS 5 mg/dL FOR MEDICAL PURPOSES ONLY   CBC with Diff     Status: Abnormal   Collection Time: 07/04/15  9:23 AM  Result Value Ref Range   WBC 9.8 4.0 - 10.5 K/uL   RBC 4.95 3.87 - 5.11 MIL/uL   Hemoglobin 14.8 12.0 - 15.0 g/dL   HCT 46.6 (H) 36.0 - 46.0 %   MCV 94.1 78.0 - 100.0 fL   MCH 29.9 26.0 - 34.0 pg   MCHC 31.8 30.0 - 36.0 g/dL   RDW 15.2 11.5 - 15.5 %   Platelets 233 150 - 400 K/uL   Neutrophils Relative % 78 %   Neutro Abs 7.6 1.7 - 7.7 K/uL   Lymphocytes Relative 10 %   Lymphs Abs 1.0 0.7 - 4.0 K/uL   Monocytes Relative 9 %   Monocytes Absolute 0.9 0.1 - 1.0 K/uL   Eosinophils Relative 3 %   Eosinophils Absolute 0.3 0.0 - 0.7 K/uL   Basophils Relative 0 %   Basophils Absolute 0.0 0.0 - 0.1 K/uL  Salicylate level     Status: None   Collection Time: 07/04/15  9:23 AM  Result Value Ref Range   Salicylate Lvl <4.0 2.8 - 30.0 mg/dL  Acetaminophen level     Status: Abnormal   Collection Time: 07/04/15  9:23 AM  Result Value Ref Range   Acetaminophen (Tylenol), Serum <10 (L) 10 - 30 ug/mL    Comment:        THERAPEUTIC CONCENTRATIONS VARY SIGNIFICANTLY. A RANGE OF 10-30 ug/mL MAY  BE AN EFFECTIVE CONCENTRATION FOR MANY PATIENTS. HOWEVER, SOME ARE BEST TREATED AT CONCENTRATIONS OUTSIDE THIS RANGE. ACETAMINOPHEN CONCENTRATIONS >150 ug/mL AT 4 HOURS AFTER INGESTION AND >50 ug/mL AT 12 HOURS AFTER INGESTION ARE OFTEN ASSOCIATED WITH TOXIC REACTIONS.     Current Facility-Administered Medications  Medication Dose Route Frequency Provider Last Rate Last Dose  . aspirin chewable tablet 81 mg  81 mg Oral Daily Samaria Anes, MD   81 mg at 07/05/15 1413  . calcium-vitamin D (OSCAL WITH D) 500-200 MG-UNIT per tablet   Oral Daily  Corena Pilgrim, MD      . Derrill Memo ON 07/06/2015] ferrous sulfate tablet 325 mg  325 mg Oral Q breakfast Samarth Ogle, MD      . furosemide (LASIX) tablet 20 mg  20 mg Oral Daily Ife Vitelli, MD   20 mg at 07/05/15 1245  . levothyroxine (SYNTHROID, LEVOTHROID) tablet 50 mcg  50 mcg Oral QAC breakfast Corena Pilgrim, MD   50 mcg at 07/05/15 1412  . midodrine (PROAMATINE) tablet 2.5 mg  2.5 mg Oral BID Corena Pilgrim, MD   2.5 mg at 07/05/15 1413  . nitroGLYCERIN (NITRODUR - Dosed in mg/24 hr) patch 0.1 mg  0.1 mg Transdermal Daily Shanyah Gattuso, MD   0.1 mg at 07/05/15 1413  . vitamin B-12 (CYANOCOBALAMIN) tablet 1,000 mcg  1,000 mcg Oral Daily Corena Pilgrim, MD   1,000 mcg at 07/05/15 1412   Current Outpatient Prescriptions  Medication Sig Dispense Refill  . alendronate (FOSAMAX) 35 MG tablet TAKE 1 TABLET EVERY 7 DAYS WITH A FULL GLASS OF WATER ON AN EMPTY STOMACH (Patient taking differently: TAKE 1 TABLET EVERY 7 DAYS WITH A FULL GLASS OF WATER ON AN EMPTY STOMACH- Monday) 12 tablet 1  . aspirin 81 MG tablet Take 81 mg by mouth daily.    . Calcium Carbonate-Vitamin D (CALCIUM 600+D PO) Take 1 tablet by mouth daily.     . cholecalciferol 2000 UNITS tablet Take 1 capsule daily or as directed 90 tablet 7  . doxycycline (VIBRA-TABS) 100 MG tablet Take 100 mg by mouth daily. for 10 days  0  . ferrous sulfate 325 (65 FE) MG tablet Take 325 mg by mouth daily with breakfast.    . furosemide (LASIX) 20 MG tablet Take 20 mg by mouth daily.      Marland Kitchen lactobacillus acidophilus (BACID) TABS tablet Take 1 tablet by mouth daily. For 14 days    . letrozole (FEMARA) 2.5 MG tablet Take 1 tablet (2.5 mg total) by mouth daily. 90 tablet 3  . levothyroxine (SYNTHROID, LEVOTHROID) 50 MCG tablet Take 50 mcg by mouth daily.      . midodrine (PROAMATINE) 2.5 MG tablet TAKE 1 TABLET TWICE A DAY 180 tablet 2  . nitroGLYCERIN (NITRODUR - DOSED IN MG/24 HR) 0.1 mg/hr patch Place 0.1 mg onto the skin daily.   4  .  silver sulfADIAZINE (SILVADENE) 1 % cream Apply 1 application topically daily.   13  . vitamin B-12 (CYANOCOBALAMIN) 500 MCG tablet Take 1,000 mcg by mouth daily. Takes 2 pills (1058m) daily    . WELCHOL 625 MG tablet Take 0.5 tablets by mouth daily.  2    Musculoskeletal: Strength & Muscle Tone: within normal limits Gait & Station: normal Patient leans: N/A  Psychiatric Specialty Exam: Review of Systems  Constitutional: Negative.   HENT: Negative.   Eyes: Negative.   Respiratory: Negative.   Cardiovascular: Negative.   Gastrointestinal: Negative.   Genitourinary: Negative.   Musculoskeletal:  Negative.   Skin: Negative.   Neurological: Negative.   Endo/Heme/Allergies: Negative.   SEE documented PMH, Patient is medically stable at this time.  Blood pressure 109/82, pulse 61, temperature 98 F (36.7 C), temperature source Oral, resp. rate 16, SpO2 94 %.There is no weight on file to calculate BMI.  General Appearance: Casual and Fairly Groomed  Engineer, water::  Good  Speech:  Clear and Coherent and Normal Rate  Volume:  Normal  Mood:  Anxious  Affect:  Congruent  Thought Process:  Coherent, Goal Directed and Intact  Orientation:  Full (Time, Place, and Person)  Thought Content:  Delusions  Suicidal Thoughts:  No  Homicidal Thoughts:  No  Memory:  Immediate;   Good Recent;   Good Remote;   Fair  Judgement:  Fair  Insight:  Fair  Psychomotor Activity:  Normal  Concentration:  Good  Recall:  NA  Fund of Knowledge:Fair  Language: Fair  Akathisia:  NA  Handed:  Right  AIMS (if indicated):     Assets:  Desire for Improvement Housing  ADL's:  Intact  Cognition: WNL  Sleep:      Treatment Plan Summary: Daily contact with patient to assess and evaluate symptoms and progress in treatment and Medication management  Disposition:   Accepted for admission and we will be seeking placement at any gero-psychiatric facility for her safety and stabilization.  Delfin Gant,  NP   PMHNP-BC 07/05/2015 4:32 PM  Patient seen face-to-face for psychiatric evaluation, chart reviewed and case discussed with the physician extender and developed treatment plan. Reviewed the information documented and agree with the treatment plan. Corena Pilgrim, MD

## 2015-07-05 NOTE — ED Notes (Signed)
Per pt's request called pt's hairdresser to cancel today's appointment.

## 2015-07-05 NOTE — Progress Notes (Signed)
Candice/Nurse at [facility] reached out to [facility] to inquire if patient was still in need of a bed. She states that she will review patient with their team, and follow back up with CSW to inform her of their decision of whether they will offer the patient a bed.  Willette Brace [id number] ED CSW 07/05/2015 9:07 PM

## 2015-07-05 NOTE — Progress Notes (Signed)
CSW reached out to [facility] to inquire if facility is considering patient for placement. CSW spoke with Gerald Stabs who states he will follow back up with CSW of a decision.  Willette Brace [id number] ED CSW 07/05/2015 9:09 PM

## 2015-07-05 NOTE — Progress Notes (Addendum)
CSW spoke with Gerald Stabs of [facility] who states that patient has been accepted and is welcomed to come tomorrow morning after 6 am.   Accepting Physician: Dr.Komissarova Bed: [place] Report number : [phone]   Willette Brace [id number] ED CSW 07/05/2015 11:30 PM

## 2015-07-05 NOTE — BH Assessment (Signed)
[name] Assessment Progress Note  Per Corena Pilgrim, MD, this pt requires psychiatric hospitalization at this time.  The following facilities have been contacted to seek placement for this pt, with results as noted:  Beds available, information sent, decision pending:  [place] [facility]   At capacity:  Santiago Bumpers, [place] Triage Specialist [phone]

## 2015-07-06 DIAGNOSIS — I2510 Atherosclerotic heart disease of native coronary artery without angina pectoris: Secondary | ICD-10-CM | POA: Diagnosis present

## 2015-07-06 DIAGNOSIS — L03116 Cellulitis of left lower limb: Secondary | ICD-10-CM | POA: Diagnosis not present

## 2015-07-06 DIAGNOSIS — F22 Delusional disorders: Secondary | ICD-10-CM | POA: Diagnosis not present

## 2015-07-06 DIAGNOSIS — S91301D Unspecified open wound, right foot, subsequent encounter: Secondary | ICD-10-CM | POA: Diagnosis not present

## 2015-07-06 DIAGNOSIS — Z792 Long term (current) use of antibiotics: Secondary | ICD-10-CM | POA: Diagnosis not present

## 2015-07-06 DIAGNOSIS — E039 Hypothyroidism, unspecified: Secondary | ICD-10-CM | POA: Diagnosis present

## 2015-07-06 DIAGNOSIS — Z950 Presence of cardiac pacemaker: Secondary | ICD-10-CM | POA: Diagnosis not present

## 2015-07-06 DIAGNOSIS — E669 Obesity, unspecified: Secondary | ICD-10-CM | POA: Diagnosis not present

## 2015-07-06 DIAGNOSIS — Z7982 Long term (current) use of aspirin: Secondary | ICD-10-CM | POA: Diagnosis not present

## 2015-07-06 DIAGNOSIS — F329 Major depressive disorder, single episode, unspecified: Secondary | ICD-10-CM | POA: Diagnosis present

## 2015-07-06 DIAGNOSIS — S91301A Unspecified open wound, right foot, initial encounter: Secondary | ICD-10-CM | POA: Diagnosis not present

## 2015-07-06 DIAGNOSIS — E079 Disorder of thyroid, unspecified: Secondary | ICD-10-CM | POA: Diagnosis not present

## 2015-07-06 DIAGNOSIS — F29 Unspecified psychosis not due to a substance or known physiological condition: Secondary | ICD-10-CM | POA: Diagnosis not present

## 2015-07-06 DIAGNOSIS — R609 Edema, unspecified: Secondary | ICD-10-CM | POA: Diagnosis present

## 2015-07-06 DIAGNOSIS — Z853 Personal history of malignant neoplasm of breast: Secondary | ICD-10-CM | POA: Diagnosis not present

## 2015-07-06 DIAGNOSIS — F23 Brief psychotic disorder: Secondary | ICD-10-CM | POA: Diagnosis not present

## 2015-07-06 DIAGNOSIS — F321 Major depressive disorder, single episode, moderate: Secondary | ICD-10-CM | POA: Diagnosis not present

## 2015-07-06 MED ORDER — LORAZEPAM 1 MG PO TABS
1.0000 mg | ORAL_TABLET | Freq: Once | ORAL | Status: AC
  Administered 2015-07-06: 1 mg via ORAL
  Filled 2015-07-06: qty 1

## 2015-07-06 NOTE — ED Notes (Signed)
Mr. Milus Height of [facility] [employer] aware. Will send driver after 7am to transport pt to [facility].   Called [facility] to give report, they asked that we call after transport arrives. Will re-attempt report after 7am.   Accepting Physician: Dr.Komissarova Bed: [place] Report number : [phone]

## 2015-07-06 NOTE — ED Notes (Signed)
Report given to Pamala Hurry, Therapist, sports at [facility] via [employer]

## 2015-07-07 DIAGNOSIS — S91301A Unspecified open wound, right foot, initial encounter: Secondary | ICD-10-CM | POA: Insufficient documentation

## 2015-07-07 DIAGNOSIS — I2510 Atherosclerotic heart disease of native coronary artery without angina pectoris: Secondary | ICD-10-CM | POA: Insufficient documentation

## 2015-07-08 ENCOUNTER — Other Ambulatory Visit: Payer: Self-pay | Admitting: Internal Medicine

## 2015-07-11 ENCOUNTER — Ambulatory Visit: Payer: Medicare Other | Admitting: Hematology and Oncology

## 2015-07-11 ENCOUNTER — Other Ambulatory Visit: Payer: Medicare Other

## 2015-07-11 NOTE — Assessment & Plan Note (Deleted)
Bilateral breast cancers status post bilateral lumpectomies followed by bilateral radiation treatments to the breast and currently on Femara 2.5 mg by mouth daily since 2009. Patient has been tolerating Femara extremely well without any major problems. She said today walking with a walker. She has not had a mammogram since 2013 but she would like to start back on mammograms again.  We discussed whether discontinuation of Femara is an option but the patient was reluctant to stop it and wanted to continue it Indefinitely.  Breast cancer surveillance: 1. Mammograms 05/11/2014 normal category B breast density 2. Bone density showed osteopenia T score -2.2 2013 3. breast exam 01/10/2015 is normal  Right ankle wound: Healed very well Renal cell cancer: Status post nephrectomy  Return to clinic in 1 year for follow-up with labs

## 2015-07-13 ENCOUNTER — Encounter: Payer: Self-pay | Admitting: Internal Medicine

## 2015-07-24 ENCOUNTER — Telehealth: Payer: Self-pay | Admitting: Hematology and Oncology

## 2015-07-24 NOTE — Telephone Encounter (Signed)
pt called to r/s missed appt.....pt ok and aware °

## 2015-07-25 ENCOUNTER — Telehealth: Payer: Self-pay | Admitting: Hematology and Oncology

## 2015-07-25 DIAGNOSIS — L0390 Cellulitis, unspecified: Secondary | ICD-10-CM | POA: Diagnosis not present

## 2015-07-25 DIAGNOSIS — F22 Delusional disorders: Secondary | ICD-10-CM | POA: Diagnosis not present

## 2015-07-25 DIAGNOSIS — F419 Anxiety disorder, unspecified: Secondary | ICD-10-CM | POA: Diagnosis not present

## 2015-07-25 DIAGNOSIS — Z09 Encounter for follow-up examination after completed treatment for conditions other than malignant neoplasm: Secondary | ICD-10-CM | POA: Diagnosis not present

## 2015-07-25 NOTE — Telephone Encounter (Signed)
Patient called to r/s appt for 3/31 to 4/4 at 9 am

## 2015-07-26 DIAGNOSIS — M810 Age-related osteoporosis without current pathological fracture: Secondary | ICD-10-CM | POA: Diagnosis not present

## 2015-07-26 DIAGNOSIS — E11621 Type 2 diabetes mellitus with foot ulcer: Secondary | ICD-10-CM | POA: Diagnosis not present

## 2015-07-26 DIAGNOSIS — I951 Orthostatic hypotension: Secondary | ICD-10-CM | POA: Diagnosis not present

## 2015-07-26 DIAGNOSIS — L97513 Non-pressure chronic ulcer of other part of right foot with necrosis of muscle: Secondary | ICD-10-CM | POA: Diagnosis not present

## 2015-07-26 DIAGNOSIS — B961 Klebsiella pneumoniae [K. pneumoniae] as the cause of diseases classified elsewhere: Secondary | ICD-10-CM | POA: Diagnosis not present

## 2015-07-26 DIAGNOSIS — I442 Atrioventricular block, complete: Secondary | ICD-10-CM | POA: Diagnosis not present

## 2015-07-28 ENCOUNTER — Other Ambulatory Visit: Payer: Self-pay

## 2015-07-28 ENCOUNTER — Emergency Department (HOSPITAL_COMMUNITY)
Admission: EM | Admit: 2015-07-28 | Discharge: 2015-07-28 | Disposition: A | Discharge status: Home / Self Care | Payer: Medicare Other | Attending: Emergency Medicine | Admitting: Emergency Medicine

## 2015-07-28 ENCOUNTER — Encounter (HOSPITAL_COMMUNITY): Payer: Self-pay | Admitting: Emergency Medicine

## 2015-07-28 ENCOUNTER — Ambulatory Visit: Payer: Self-pay | Admitting: Hematology and Oncology

## 2015-07-28 ENCOUNTER — Emergency Department (HOSPITAL_COMMUNITY): Payer: Medicare Other

## 2015-07-28 DIAGNOSIS — I442 Atrioventricular block, complete: Secondary | ICD-10-CM | POA: Diagnosis not present

## 2015-07-28 DIAGNOSIS — Z7982 Long term (current) use of aspirin: Secondary | ICD-10-CM | POA: Insufficient documentation

## 2015-07-28 DIAGNOSIS — S8001XA Contusion of right knee, initial encounter: Secondary | ICD-10-CM | POA: Insufficient documentation

## 2015-07-28 DIAGNOSIS — Z79899 Other long term (current) drug therapy: Secondary | ICD-10-CM | POA: Insufficient documentation

## 2015-07-28 DIAGNOSIS — E11621 Type 2 diabetes mellitus with foot ulcer: Secondary | ICD-10-CM | POA: Diagnosis not present

## 2015-07-28 DIAGNOSIS — Z8679 Personal history of other diseases of the circulatory system: Secondary | ICD-10-CM | POA: Diagnosis not present

## 2015-07-28 DIAGNOSIS — L97513 Non-pressure chronic ulcer of other part of right foot with necrosis of muscle: Secondary | ICD-10-CM | POA: Diagnosis not present

## 2015-07-28 DIAGNOSIS — Z853 Personal history of malignant neoplasm of breast: Secondary | ICD-10-CM | POA: Insufficient documentation

## 2015-07-28 DIAGNOSIS — E079 Disorder of thyroid, unspecified: Secondary | ICD-10-CM | POA: Insufficient documentation

## 2015-07-28 DIAGNOSIS — W1839XA Other fall on same level, initial encounter: Secondary | ICD-10-CM | POA: Insufficient documentation

## 2015-07-28 DIAGNOSIS — Y929 Unspecified place or not applicable: Secondary | ICD-10-CM | POA: Diagnosis not present

## 2015-07-28 DIAGNOSIS — Y998 Other external cause status: Secondary | ICD-10-CM | POA: Insufficient documentation

## 2015-07-28 DIAGNOSIS — I951 Orthostatic hypotension: Secondary | ICD-10-CM | POA: Diagnosis not present

## 2015-07-28 DIAGNOSIS — E669 Obesity, unspecified: Secondary | ICD-10-CM | POA: Insufficient documentation

## 2015-07-28 DIAGNOSIS — S8991XA Unspecified injury of right lower leg, initial encounter: Secondary | ICD-10-CM | POA: Diagnosis present

## 2015-07-28 DIAGNOSIS — M25561 Pain in right knee: Secondary | ICD-10-CM | POA: Diagnosis not present

## 2015-07-28 DIAGNOSIS — Z8669 Personal history of other diseases of the nervous system and sense organs: Secondary | ICD-10-CM | POA: Insufficient documentation

## 2015-07-28 DIAGNOSIS — M810 Age-related osteoporosis without current pathological fracture: Secondary | ICD-10-CM | POA: Diagnosis not present

## 2015-07-28 DIAGNOSIS — Y9289 Other specified places as the place of occurrence of the external cause: Secondary | ICD-10-CM | POA: Insufficient documentation

## 2015-07-28 DIAGNOSIS — B961 Klebsiella pneumoniae [K. pneumoniae] as the cause of diseases classified elsewhere: Secondary | ICD-10-CM | POA: Diagnosis not present

## 2015-07-28 DIAGNOSIS — T148 Other injury of unspecified body region: Secondary | ICD-10-CM | POA: Diagnosis not present

## 2015-07-28 DIAGNOSIS — Y9389 Activity, other specified: Secondary | ICD-10-CM | POA: Diagnosis not present

## 2015-07-28 DIAGNOSIS — M7989 Other specified soft tissue disorders: Secondary | ICD-10-CM | POA: Diagnosis not present

## 2015-07-28 MED ORDER — TRAMADOL HCL 50 MG PO TABS
50.0000 mg | ORAL_TABLET | Freq: Once | ORAL | Status: AC
  Administered 2015-07-28: 50 mg via ORAL
  Filled 2015-07-28: qty 1

## 2015-07-28 MED ORDER — TRAMADOL HCL 50 MG PO TABS: 50.0000 mg | ORAL_TABLET | Freq: Four times a day (QID) | ORAL | Status: DC | PRN

## 2015-07-28 MED ORDER — ACETAMINOPHEN 500 MG PO TABS: 500.0000 mg | ORAL_TABLET | Freq: Four times a day (QID) | ORAL | Status: DC | PRN

## 2015-07-28 NOTE — ED Notes (Signed)
Bed: [id number] Expected date:  Expected time:  Means of arrival:  Comments: EMS 82yo F fall/ rt knee pain

## 2015-07-28 NOTE — ED Provider Notes (Signed)
CSN: [id number]     Arrival date & time 07/28/15  1951 History  By signing my name below, I, Evelene Croon, attest that this documentation has been prepared under the direction and in the presence of non-physician practitioner, Antonietta Breach, PA-C. Electronically Signed: Evelene Croon, Scribe. 07/28/2015.9:43 PM    Chief Complaint  Patient presents with  . Knee Pain    The history is provided by the patient. No language interpreter was used.    HPI Comments:  [name] is a 82 y.o. female who presents to the Emergency Department s/p mechanical this AM fall ~ 0730/0800 complaining of moderate, right knee pain following the incident. Pt notes she landed on the knee. She reports associated swelling at the site. Pt denies LOC and head injury. Pt has been able to ambulate with the aid of a cane. She states she normally ambulates with a walker. Her pain is exacerbated with ambulation. She has applied ice with mild relief.    Past Medical History  Diagnosis Date  . Invasive ductal carcinoma of breast ([place]) 03/2007    BILATERAL BREASTS  . Cataract   . Hypercholesterolemia   . Thyroid disease   . Complete heart block (HCC)     s/p PPM implant (MDT) by Dr Blanch Media.  Atrial lead could not be paced at time of the procedure.  She has chronic AV dysociation  . Orthostatic hypotension     treated with midodrine by Dr Rollene [name]  . Exogenous obesity   . Venous insufficiency    Past Surgical History  Procedure Laterality Date  . Abdominal hysterectomy    . Cholecystectomy    . Cataract extraction      EYE SURGERY X 2  . Pacemaker insertion      MDT implanted by Dr Blanch Media.  Atrial lead placement was unsuccessful.  She has chronic AV dysociation  . Remote right radical nephrectomy  2009  . Breast lumpectomy Bilateral 2009  . Esophagogastroduodenoscopy N/A 02/16/2015    Procedure: ESOPHAGOGASTRODUODENOSCOPY (EGD);  Surgeon: Wilford Corner, MD;  Location: [facility] ENDOSCOPY;  Service: Endoscopy;   Laterality: N/A;  . Colonoscopy N/A 02/16/2015    Procedure: COLONOSCOPY;  Surgeon: Wilford Corner, MD;  Location: [facility] ENDOSCOPY;  Service: Endoscopy;  Laterality: N/A;  . Ep implantable device N/A 02/23/2015    Procedure: PPM Generator Changeout;  Surgeon: Will Meredith Leeds, MD;  Location: [place] [facility];  Service: Cardiovascular;  Laterality: N/A;   Family History  Problem Relation Age of Onset  . Heart disease Maternal Grandmother   . Heart disease Mother    Social History  Substance Use Topics  . Smoking status: Never Smoker   . Smokeless tobacco: None  . Alcohol Use: No   OB History    No data available      Review of Systems  Respiratory: Negative for shortness of breath.   Cardiovascular: Negative for chest pain.  Musculoskeletal: Positive for myalgias, joint swelling and arthralgias.       Right knee  Neurological: Negative for dizziness.    Allergies  Erythromycin and Tape  Home Medications   Prior to Admission medications   Medication Sig Start Date End Date Taking? Authorizing Provider  aspirin 81 MG tablet Take 81 mg by mouth daily.   Yes Historical Provider, MD  Calcium Carbonate-Vitamin D (CALCIUM 600+D PO) Take 1 tablet by mouth daily.    Yes Historical Provider, MD  cephALEXin (KEFLEX) 500 MG capsule Take 500 mg by mouth 2 (two) times  daily. ABT Start Date 07/27/15 & End Date 08/05/15. 07/25/15  Yes Historical Provider, MD  cholecalciferol 2000 UNITS tablet Take 1 capsule daily or as directed Patient taking differently: Take 2,000 Units by mouth daily.  09/14/12  Yes Consuela Mimes, MD  ferrous sulfate 325 (65 FE) MG tablet Take 325 mg by mouth daily as needed (low iron).    Yes Historical Provider, MD  furosemide (LASIX) 20 MG tablet Take 20 mg by mouth daily.     Yes Historical Provider, MD  letrozole (FEMARA) 2.5 MG tablet Take 1 tablet (2.5 mg total) by mouth daily. 12/26/14  Yes [name], MD  levothyroxine (SYNTHROID, LEVOTHROID) 50 MCG tablet Take  50 mcg by mouth daily.     Yes Historical Provider, MD  midodrine (PROAMATINE) 2.5 MG tablet TAKE 1 TABLET TWICE A DAY 07/10/15  Yes Thompson Grayer, MD  nitroGLYCERIN (NITRODUR - DOSED IN MG/24 HR) 0.1 mg/hr patch Place 0.1 mg onto the skin daily.  06/14/14  Yes Historical Provider, MD  WELCHOL 625 MG tablet Take 0.5 tablets by mouth daily. 01/13/14  Yes Historical Provider, MD  alendronate (FOSAMAX) 35 MG tablet TAKE 1 TABLET EVERY 7 DAYS WITH A FULL GLASS OF WATER ON AN EMPTY STOMACH Patient taking differently: TAKE 1 TABLET EVERY 7 DAYS WITH A FULL GLASS OF WATER ON AN EMPTY STOMACH- Monday 02/15/15   [name], MD   BP 106/67 mmHg  Pulse 66  Temp(Src) 99 F (37.2 C) (Oral)  Ht 5\' 7"  (1.702 m)  Wt 99.791 kg  BMI 34.45 kg/m2  SpO2 97%   Physical Exam  Constitutional: She is oriented to person, place, and time. She appears well-developed and well-nourished. No distress.  Nontoxic appearing, conversing with friend at bedside in no distress  HENT:  Head: Normocephalic and atraumatic.  Eyes: Conjunctivae and EOM are normal. No scleral icterus.  Neck: Normal range of motion.  Cardiovascular: Normal rate, regular rhythm and intact distal pulses.   DP and PT pulses 2+ in the RLE  Pulmonary/Chest: Effort normal. No respiratory distress.  Musculoskeletal: Normal range of motion. She exhibits tenderness.  Grade I-II ulcer to the plantar aspect of the R foot, proximal to the 4th toe. 2+ pitting edema in the RLE.  TTP inferior to the R patella. Normal ROM of the R knee. Mild swelling without effusion. No crepitus or deformity.  Neurological: She is alert and oriented to person, place, and time. She exhibits normal muscle tone. Coordination normal.  Sensation to light touch intact in the RLE. Patient able to wiggle all toes.  Skin: Skin is warm and dry. No rash noted. She is not diaphoretic. No erythema. No pallor.  Psychiatric: She has a normal mood and affect. Her behavior is normal.  Nursing  note and vitals reviewed.   ED Course  Procedures   DIAGNOSTIC STUDIES:  Oxygen Saturation is 97% on RA, normal by my interpretation.    COORDINATION OF CARE:  9:14 PM Discussed treatment plan with pt at bedside and pt agreed to plan.   Imaging Review Dg Knee Complete 4 Views Right  07/28/2015  CLINICAL DATA:  Status post fall, with injury to the right knee. Medial and anterior right knee pain. Initial encounter. EXAM: RIGHT KNEE - COMPLETE 4+ VIEW COMPARISON:  None. FINDINGS: There is no evidence of fracture or dislocation. The joint spaces are preserved. Small wall osteophytes are noted. The patellofemoral joint demonstrates mild sclerotic change but is otherwise grossly unremarkable. No significant joint effusion is seen.  The visualized soft tissues are normal in appearance. IMPRESSION: No evidence of fracture or dislocation. Electronically Signed   By: Garald Balding M.D.   On: 07/28/2015 21:25     I have personally reviewed and evaluated these images and lab results as part of my medical decision-making.    MDM   Final diagnoses:  Knee contusion, right, initial encounter    82 year old female presents to the emergency department after a fall this morning landing on her knee. No head trauma or loss of consciousness. Patient with tenderness inferior to the patella. She is neurovascularly intact. X-ray negative for fracture, dislocation, or bony deformity. X-rays reviewed by myself. No evidence of tibial plateau fracture. Also doubt this given that patient has been able to weight-bear over the past 12 hours with the assistance of a cane. She has normal range of motion of her right knee. Symptoms consistent with contusion. Will apply Ace wrap in ED and referred to orthopedics for follow-up as needed. Patient given short course of tramadol if pain is not well controlled with Tylenol. Return precautions provided. Patient discharged in satisfactory condition.  I personally performed the  services described in this documentation, which was scribed in my presence. The recorded information has been reviewed and is accurate.    Filed Vitals:   07/28/15 2003  BP: 106/67  Pulse: 66  Temp: 99 F (37.2 C)  TempSrc: Oral  Height: 5\' 7"  (1.702 m)  Weight: 99.791 kg  SpO2: 97%      Antonietta Breach, PA-C 07/28/15 2158  Gareth Morgan, MD 07/31/15 1455

## 2015-07-28 NOTE — ED Notes (Signed)
Pt reports understanding of discharge information. No questions at time of discharge 

## 2015-07-28 NOTE — Discharge Instructions (Signed)
RICE for Routine Care of Injuries Theroutine careofmanyinjuriesincludes rest, ice, compression, and elevation (RICE therapy). RICE therapy is often recommended for injuries to soft tissues, such as a muscle strain, ligament injuries, bruises, and overuse injuries. It can also be used for some bony injuries. Using RICE therapy can help to relieve pain, lessen swelling, and enable your body to heal. Rest Rest is required to allow your body to heal. This usually involves reducing your normal activities and avoiding use of the injured part of your body. Generally, you can return to your normal activities when you are comfortable and have been given permission by your health care provider. Ice Icing your injury helps to keep the swelling down, and it lessens pain. Do not apply ice directly to your skin.  Put ice in a plastic bag.  Place a towel between your skin and the bag.  Leave the ice on for 20 minutes, 2-3 times a day. Do this for as long as you are directed by your health care provider. Compression Compression means putting pressure on the injured area. Compression helps to keep swelling down, gives support, and helps with discomfort. Compression may be done with an elastic bandage. If an elastic bandage has been applied, follow these general tips:  Remove and reapply the bandage every 3-4 hours or as directed by your health care provider.  Make sure the bandage is not wrapped too tightly, because this can cut off circulation. If part of your body beyond the bandage becomes blue, numb, cold, swollen, or more painful, your bandage is most likely too tight. If this occurs, remove your bandage and reapply it more loosely.  See your health care provider if the bandage seems to be making your problems worse rather than better. Elevation Elevation means keeping the injured area raised. This helps to lessen swelling and decrease pain. If possible, your injured area should be elevated at or  above the level of your heart or the center of your chest. [place]? You should seek medical care if:  Your pain and swelling continue.  Your symptoms are getting worse rather than improving. These symptoms may indicate that further evaluation or further X-rays are needed. Sometimes, X-rays may not show a small broken bone (fracture) until a number of days later. Make a follow-up appointment with your health care provider. WHEN SHOULD I SEEK IMMEDIATE MEDICAL CARE? You should seek immediate medical care if:  You have sudden severe pain at or below the area of your injury.  You have redness or increased swelling around your injury.  You have tingling or numbness at or below the area of your injury that does not improve after you remove the elastic bandage.   This information is not intended to replace advice given to you by your health care provider. Make sure you discuss any questions you have with your health care provider.   Document Released: 07/28/2000 Document Revised: 01/04/2015 Document Reviewed: 03/23/2014 Elsevier Interactive Patient Education 2016 Elsevier [employer] Knee Pain Knee pain is a common problem. It can have many causes. The pain often goes away by following your doctor's home care instructions. Treatment for ongoing pain will depend on the cause of your pain. If your knee pain continues, more tests may be needed to diagnose your condition. Tests may include X-rays or other imaging studies of your knee. HOME CARE  Take medicines only as told by your doctor.  Rest your knee and keep it raised (elevated) while you are  resting.  Do not do things that cause pain or make your pain worse.  Avoid activities where both feet leave the ground at the same time, such as running, jumping rope, or doing jumping jacks.  Apply ice to the knee area:  Put ice in a plastic bag.  Place a towel between your skin and the bag.  Leave the ice on for 20 minutes,  2-3 times a day.  Ask your doctor if you should wear an elastic knee support.  Sleep with a pillow under your knee.  Lose weight if you are overweight. Being overweight can make your knee hurt more.  Do not use any tobacco products, including cigarettes, chewing tobacco, or electronic cigarettes. If you need help quitting, ask your doctor. Smoking may slow the healing of any bone and joint problems that you may have. GET HELP IF:  Your knee pain does not stop, it changes, or it gets worse.  You have a fever along with knee pain.  Your knee gives out or locks up.  Your knee becomes more swollen. GET HELP RIGHT AWAY IF:   Your knee feels hot to the touch.  You have chest pain or trouble breathing.   This information is not intended to replace advice given to you by your health care provider. Make sure you discuss any questions you have with your health care provider.   Document Released: 07/12/2008 Document Revised: 05/06/2014 Document Reviewed: 06/16/2013 Elsevier Interactive Patient Education [employer].

## 2015-07-28 NOTE — ED Notes (Signed)
Per EMS pt fell on right knee this morning around 8am and has continued to have pain and swelling. Pt denies any LOC. Pt reports pain with movement and minor relief from ice to knee.

## 2015-07-31 ENCOUNTER — Other Ambulatory Visit: Payer: Self-pay | Admitting: Hematology and Oncology

## 2015-07-31 DIAGNOSIS — L98499 Non-pressure chronic ulcer of skin of other sites with unspecified severity: Secondary | ICD-10-CM | POA: Diagnosis not present

## 2015-08-01 ENCOUNTER — Ambulatory Visit (HOSPITAL_BASED_OUTPATIENT_CLINIC_OR_DEPARTMENT_OTHER): Payer: Medicare Other | Admitting: Hematology and Oncology

## 2015-08-01 ENCOUNTER — Encounter: Payer: Self-pay | Admitting: Hematology and Oncology

## 2015-08-01 ENCOUNTER — Telehealth: Payer: Self-pay | Admitting: Hematology and Oncology

## 2015-08-01 ENCOUNTER — Other Ambulatory Visit (HOSPITAL_BASED_OUTPATIENT_CLINIC_OR_DEPARTMENT_OTHER): Payer: Medicare Other

## 2015-08-01 VITALS — BP 103/60 | HR 78 | Temp 98.0°F | Resp 18 | Ht 67.0 in | Wt 219.6 lb

## 2015-08-01 DIAGNOSIS — C50311 Malignant neoplasm of lower-inner quadrant of right female breast: Secondary | ICD-10-CM

## 2015-08-01 DIAGNOSIS — B961 Klebsiella pneumoniae [K. pneumoniae] as the cause of diseases classified elsewhere: Secondary | ICD-10-CM | POA: Diagnosis not present

## 2015-08-01 DIAGNOSIS — C50112 Malignant neoplasm of central portion of left female breast: Principal | ICD-10-CM

## 2015-08-01 DIAGNOSIS — F419 Anxiety disorder, unspecified: Secondary | ICD-10-CM

## 2015-08-01 DIAGNOSIS — L03116 Cellulitis of left lower limb: Secondary | ICD-10-CM

## 2015-08-01 DIAGNOSIS — C50111 Malignant neoplasm of central portion of right female breast: Secondary | ICD-10-CM

## 2015-08-01 DIAGNOSIS — L97513 Non-pressure chronic ulcer of other part of right foot with necrosis of muscle: Secondary | ICD-10-CM | POA: Diagnosis not present

## 2015-08-01 DIAGNOSIS — I442 Atrioventricular block, complete: Secondary | ICD-10-CM | POA: Diagnosis not present

## 2015-08-01 DIAGNOSIS — M810 Age-related osteoporosis without current pathological fracture: Secondary | ICD-10-CM | POA: Diagnosis not present

## 2015-08-01 DIAGNOSIS — I951 Orthostatic hypotension: Secondary | ICD-10-CM | POA: Diagnosis not present

## 2015-08-01 DIAGNOSIS — F22 Delusional disorders: Secondary | ICD-10-CM

## 2015-08-01 DIAGNOSIS — C50911 Malignant neoplasm of unspecified site of right female breast: Secondary | ICD-10-CM

## 2015-08-01 DIAGNOSIS — M8580 Other specified disorders of bone density and structure, unspecified site: Secondary | ICD-10-CM | POA: Diagnosis not present

## 2015-08-01 DIAGNOSIS — E11621 Type 2 diabetes mellitus with foot ulcer: Secondary | ICD-10-CM | POA: Diagnosis not present

## 2015-08-01 DIAGNOSIS — C50912 Malignant neoplasm of unspecified site of left female breast: Secondary | ICD-10-CM

## 2015-08-01 LAB — CBC WITH DIFFERENTIAL/PLATELET
BASO%: 1 % (ref 0.0–2.0)
BASOS ABS: 0.1 10*3/uL (ref 0.0–0.1)
EOS%: 6.6 % (ref 0.0–7.0)
Eosinophils Absolute: 0.5 10*3/uL (ref 0.0–0.5)
HCT: 39.8 % (ref 34.8–46.6)
HGB: 12.8 g/dL (ref 11.6–15.9)
LYMPH%: 10.6 % — AB (ref 14.0–49.7)
MCH: 30 pg (ref 25.1–34.0)
MCHC: 32.2 g/dL (ref 31.5–36.0)
MCV: 93 fL (ref 79.5–101.0)
MONO#: 0.7 10*3/uL (ref 0.1–0.9)
MONO%: 9.8 % (ref 0.0–14.0)
NEUT#: 5.3 10*3/uL (ref 1.5–6.5)
NEUT%: 72 % (ref 38.4–76.8)
Platelets: 247 10*3/uL (ref 145–400)
RBC: 4.28 10*6/uL (ref 3.70–5.45)
RDW: 15.2 % — ABNORMAL HIGH (ref 11.2–14.5)
WBC: 7.3 10*3/uL (ref 3.9–10.3)
lymph#: 0.8 10*3/uL — ABNORMAL LOW (ref 0.9–3.3)

## 2015-08-01 LAB — COMPREHENSIVE METABOLIC PANEL
ALT: 12 U/L (ref 0–55)
AST: 12 U/L (ref 5–34)
Albumin: 3 g/dL — ABNORMAL LOW (ref 3.5–5.0)
Alkaline Phosphatase: 95 U/L (ref 40–150)
Anion Gap: 10 mEq/L (ref 3–11)
BUN: 17.7 mg/dL (ref 7.0–26.0)
CHLORIDE: 107 meq/L (ref 98–109)
CO2: 27 mEq/L (ref 22–29)
Calcium: 9.1 mg/dL (ref 8.4–10.4)
Creatinine: 1.3 mg/dL — ABNORMAL HIGH (ref 0.6–1.1)
EGFR: 39 mL/min/{1.73_m2} — AB (ref >90)
GLUCOSE: 88 mg/dL (ref 70–140)
POTASSIUM: 3.5 meq/L (ref 3.5–5.1)
SODIUM: 145 meq/L (ref 136–145)
Total Bilirubin: 0.49 mg/dL (ref 0.20–1.20)
Total Protein: 6.7 g/dL (ref 6.4–8.3)

## 2015-08-01 NOTE — Telephone Encounter (Signed)
Chart reviewed.

## 2015-08-01 NOTE — Progress Notes (Signed)
Patient Care Team: [name], MD as PCP - General (Internal Medicine)  SUMMARY OF ONCOLOGIC HISTORY:   Bilateral breast cancer ([place])   04/21/2007 Breast MRI Right breast lower inner quadrant enhancement 8.9 x 4.2 x 4 cm. Biopsy-proven invasive ductal carcinoma Left breast 2 discrete irregular nodules 12 x 8 x 9 mm 11 x 11 x 8 mm portal 3 cm. Biopsy-proven invasive ductal carcinoma   05/05/2007 Surgery Bilateral lumpectomies. Left breast multifocal invasive ductal carcinoma with DCIS 1 sentinel node negative. Right breast multifocal invasive ductal carcinoma 1.4 and 0.7 cm, 3 sentinel lymph nodes -1 showed isolated tumor cells   09/09/2007 Procedure Renal cell cancer status post nephrectomy   09/11/2007 -  Anti-estrogen oral therapy Femara 2.5 mg once daily   03/25/2011 Initial Diagnosis Breast cancer    - 09/10/2007 Radiation Therapy Radiation therapy to both breasts the lumpectomy sites   02/14/2015 - 02/17/2015 Hospital Admission  acute GI bleeding : EGD normal, colonoscopy left-sided diverticulosis suspect diverticular bleed.    CHIEF COMPLIANT: Follow-up on letrozole therapy  INTERVAL HISTORY: [name] is a 82 year old with above-mentioned history of bilateral breast cancers currently on adjuvant letrozole since 2009. She appears to be tolerating it fairly well without any side effects from the treatment. Recently she was admitted at [facility] facility or paranoia and delusions. Patient certainly seems to think that she was inappropriately placed in that facility. She is now back home. She had a cellulitis on her left leg for which she was placed on antibiotic today.  REVIEW OF SYSTEMS:   Constitutional: Denies fevers, chills or abnormal weight loss Eyes: Denies blurriness of vision Ears, nose, mouth, throat, and face: Denies mucositis or sore throat Respiratory: Denies cough, dyspnea or wheezes Cardiovascular: Denies palpitation, chest discomfort Gastrointestinal:   Denies nausea, heartburn or change in bowel habits Skin: Denies abnormal skin rashes Lymphatics: Denies new lymphadenopathy or easy bruising Neurological:Denies numbness, tingling or new weaknesses Behavioral/Psych: Patient definitely complains of neighbors encroaching on her house and making it difficult for her to be independent. Extremities: Left lower extremity redness Breast:  denies any pain or lumps or nodules in either breasts All other systems were reviewed with the patient and are negative.  I have reviewed the past medical history, past surgical history, social history and family history with the patient and they are unchanged from previous note.  ALLERGIES:  is allergic to erythromycin and tape.  MEDICATIONS:  Current Outpatient Prescriptions  Medication Sig Dispense Refill  . alendronate (FOSAMAX) 35 MG tablet TAKE 1 TABLET EVERY 7 DAYS WITH A FULL GLASS OF WATER ON AN EMPTY STOMACH (Patient taking differently: TAKE 1 TABLET EVERY 7 DAYS WITH A FULL GLASS OF WATER ON AN EMPTY STOMACH- Monday) 12 tablet 1  . aspirin 81 MG tablet Take 81 mg by mouth daily.    . Calcium Carbonate-Vitamin D (CALCIUM 600+D PO) Take 1 tablet by mouth daily.     . cholecalciferol 2000 UNITS tablet Take 1 capsule daily or as directed (Patient taking differently: Take 2,000 Units by mouth daily. ) 90 tablet 7  . ferrous sulfate 325 (65 FE) MG tablet Take 325 mg by mouth daily as needed (low iron).     . furosemide (LASIX) 20 MG tablet Take 20 mg by mouth daily.      Marland Kitchen letrozole (FEMARA) 2.5 MG tablet Take 1 tablet (2.5 mg total) by mouth daily. 90 tablet 3  . levothyroxine (SYNTHROID, LEVOTHROID) 50 MCG tablet Take 50  mcg by mouth daily.      . midodrine (PROAMATINE) 2.5 MG tablet TAKE 1 TABLET TWICE A DAY 60 tablet 1  . nitroGLYCERIN (NITRODUR - DOSED IN MG/24 HR) 0.1 mg/hr patch Place 0.1 mg onto the skin daily.   4  . traMADol (ULTRAM) 50 MG tablet Take 1 tablet (50 mg total) by mouth every 6 (six)  hours as needed for severe pain. 11 tablet 0  . WELCHOL 625 MG tablet Take 0.5 tablets by mouth daily.  2   No current facility-administered medications for this visit.    PHYSICAL EXAMINATION: ECOG PERFORMANCE STATUS: 1 - Symptomatic but completely ambulatory  Filed Vitals:   08/01/15 0902  BP: 103/60  Pulse: 78  Temp: 98 F (36.7 C)  Resp: 18   Filed Weights   08/01/15 0902  Weight: 219 lb 9.6 oz (99.61 kg)    GENERAL:alert, no distress and comfortable SKIN: skin color, texture, turgor are normal, no rashes or significant lesions EYES: normal, Conjunctiva are pink and non-injected, sclera clear OROPHARYNX:no exudate, no erythema and lips, buccal mucosa, and tongue normal  NECK: supple, thyroid normal size, non-tender, without nodularity LYMPH:  no palpable lymphadenopathy in the cervical, axillary or inguinal LUNGS: clear to auscultation and percussion with normal breathing effort HEART: regular rate & rhythm and no murmurs and no lower extremity edema ABDOMEN:abdomen soft, non-tender and normal bowel sounds MUSCULOSKELETAL:no cyanosis of digits and no clubbing  NEURO: alert & oriented x 3 with fluent speech, no focal motor/sensory deficits EXTREMITIES: No lower extremity edema BREAST: No palpable masses or nodules in either right or left breasts. No palpable axillary supraclavicular or infraclavicular adenopathy no breast tenderness or nipple discharge. (exam performed in the presence of a chaperone)  LABORATORY DATA:  I have reviewed the data as listed   Chemistry      Component Value Date/Time   NA 145 08/01/2015 0845   NA 141 07/04/2015 0923   K 3.5 08/01/2015 0845   K 3.5 07/04/2015 0923   CL 105 07/04/2015 0923   CL 106 03/03/2012 0831   CO2 27 08/01/2015 0845   CO2 28 07/04/2015 0923   BUN 17.7 08/01/2015 0845   BUN 16 07/04/2015 0923   CREATININE 1.3* 08/01/2015 0845   CREATININE 1.25* 07/04/2015 0923   CREATININE 1.23* 12/22/2012 0858      Component  Value Date/Time   CALCIUM 9.1 08/01/2015 0845   CALCIUM 8.6* 07/04/2015 0923   ALKPHOS 95 08/01/2015 0845   ALKPHOS 86 07/04/2015 0923   AST 12 08/01/2015 0845   AST 21 07/04/2015 0923   ALT 12 08/01/2015 0845   ALT 16 07/04/2015 0923   BILITOT 0.49 08/01/2015 0845   BILITOT 0.6 07/04/2015 0923       Lab Results  Component Value Date   WBC 7.3 08/01/2015   HGB 12.8 08/01/2015   HCT 39.8 08/01/2015   MCV 93.0 08/01/2015   PLT 247 08/01/2015   NEUTROABS 5.3 08/01/2015   ASSESSMENT & PLAN:  Bilateral breast cancer ([place]) Bilateral breast cancers status post bilateral lumpectomies followed by bilateral radiation treatments to the breast and currently on Femara 2.5 mg by mouth daily since 2009. Patient has been tolerating Femara extremely well without any major problems. She said today walking with a walker. She has not had a mammogram since 2013 but she would like to start back on mammograms again.  We discussed whether discontinuation of Femara is an option but the patient was reluctant to stop it and  wanted to continue it Indefinitely.  Breast cancer surveillance: 1. Mammograms 05/11/2014 normal category B breast density, patient did not want to undergo any further mammograms because she did not wish to undergo any surgical interventions even if she did have a disease recurrence. 2. Bone density showed osteopenia T score -2.2 in 2013 3. breast exam 08/01/2015 is normal  Left leg cellulitis: Her PCP just started on her antibiotic. Renal cell cancer: Status post nephrectomy Delusions and anxiety: With paranoia, it is very difficult to understand many of her allegations are true with regards to the care she received at the psychiatric facility. The patient however was not very happy with being held at the psych hospital. She wishes to remain independent and appears to be unhappy about her neighbor who is her distant cousin and claims that they are making it difficult for her to  live independently. She attributes this to her will wear she had decided to give most of the money to her church.  Return to clinic in 1 year for follow-up  No orders of the defined types were placed in this encounter.   The patient has a good understanding of the overall plan. she agrees with it. she will call with any problems that may develop before the next visit here.   Rulon Eisenmenger, MD 08/01/2015

## 2015-08-01 NOTE — Assessment & Plan Note (Signed)
Bilateral breast cancers status post bilateral lumpectomies followed by bilateral radiation treatments to the breast and currently on Femara 2.5 mg by mouth daily since 2009. Patient has been tolerating Femara extremely well without any major problems. She said today walking with a walker. She has not had a mammogram since 2013 but she would like to start back on mammograms again.  We discussed whether discontinuation of Femara is an option but the patient was reluctant to stop it and wanted to continue it Indefinitely.  Breast cancer surveillance: 1. Mammograms 05/11/2014 normal category B breast density 2. Bone density showed osteopenia T score -2.2 2013 3. breast exam 01/10/2015 is normal  Right ankle wound: Healed very well Renal cell cancer: Status post nephrectomy Delusions and anxiety: With paranoia  Return to clinic in 1 year for follow-up with labs

## 2015-08-01 NOTE — Progress Notes (Signed)
Unable to get in to exam room prior to MD.  No assessment performed.  

## 2015-08-01 NOTE — Telephone Encounter (Signed)
appt made and avs printed °

## 2015-08-02 ENCOUNTER — Encounter: Payer: Medicare Other | Admitting: Internal Medicine

## 2015-08-03 DIAGNOSIS — I951 Orthostatic hypotension: Secondary | ICD-10-CM | POA: Diagnosis not present

## 2015-08-03 DIAGNOSIS — I442 Atrioventricular block, complete: Secondary | ICD-10-CM | POA: Diagnosis not present

## 2015-08-03 DIAGNOSIS — B961 Klebsiella pneumoniae [K. pneumoniae] as the cause of diseases classified elsewhere: Secondary | ICD-10-CM | POA: Diagnosis not present

## 2015-08-03 DIAGNOSIS — M810 Age-related osteoporosis without current pathological fracture: Secondary | ICD-10-CM | POA: Diagnosis not present

## 2015-08-03 DIAGNOSIS — E11621 Type 2 diabetes mellitus with foot ulcer: Secondary | ICD-10-CM | POA: Diagnosis not present

## 2015-08-03 DIAGNOSIS — L97513 Non-pressure chronic ulcer of other part of right foot with necrosis of muscle: Secondary | ICD-10-CM | POA: Diagnosis not present

## 2015-08-04 DIAGNOSIS — L03115 Cellulitis of right lower limb: Secondary | ICD-10-CM | POA: Diagnosis not present

## 2015-08-04 DIAGNOSIS — I951 Orthostatic hypotension: Secondary | ICD-10-CM | POA: Diagnosis not present

## 2015-08-04 DIAGNOSIS — F22 Delusional disorders: Secondary | ICD-10-CM | POA: Diagnosis not present

## 2015-08-04 DIAGNOSIS — E11621 Type 2 diabetes mellitus with foot ulcer: Secondary | ICD-10-CM | POA: Diagnosis not present

## 2015-08-04 DIAGNOSIS — L97412 Non-pressure chronic ulcer of right heel and midfoot with fat layer exposed: Secondary | ICD-10-CM | POA: Diagnosis not present

## 2015-08-04 DIAGNOSIS — F419 Anxiety disorder, unspecified: Secondary | ICD-10-CM | POA: Diagnosis not present

## 2015-08-07 DIAGNOSIS — L98499 Non-pressure chronic ulcer of skin of other sites with unspecified severity: Secondary | ICD-10-CM | POA: Diagnosis not present

## 2015-08-08 DIAGNOSIS — F419 Anxiety disorder, unspecified: Secondary | ICD-10-CM | POA: Diagnosis not present

## 2015-08-08 DIAGNOSIS — F22 Delusional disorders: Secondary | ICD-10-CM | POA: Diagnosis not present

## 2015-08-08 DIAGNOSIS — E11621 Type 2 diabetes mellitus with foot ulcer: Secondary | ICD-10-CM | POA: Diagnosis not present

## 2015-08-08 DIAGNOSIS — I951 Orthostatic hypotension: Secondary | ICD-10-CM | POA: Diagnosis not present

## 2015-08-08 DIAGNOSIS — L97412 Non-pressure chronic ulcer of right heel and midfoot with fat layer exposed: Secondary | ICD-10-CM | POA: Diagnosis not present

## 2015-08-08 DIAGNOSIS — L03115 Cellulitis of right lower limb: Secondary | ICD-10-CM | POA: Diagnosis not present

## 2015-08-09 ENCOUNTER — Inpatient Hospital Stay (HOSPITAL_COMMUNITY)
Admission: AD | Admit: 2015-08-09 | Discharge: 2015-08-14 | DRG: 593 | Disposition: A | Discharge status: Home / Self Care | Payer: Medicare Other | Source: Ambulatory Visit | Attending: Internal Medicine | Admitting: Internal Medicine

## 2015-08-09 ENCOUNTER — Encounter (HOSPITAL_COMMUNITY): Payer: Self-pay

## 2015-08-09 DIAGNOSIS — L0390 Cellulitis, unspecified: Secondary | ICD-10-CM | POA: Diagnosis not present

## 2015-08-09 DIAGNOSIS — R7303 Prediabetes: Secondary | ICD-10-CM | POA: Diagnosis present

## 2015-08-09 DIAGNOSIS — L89899 Pressure ulcer of other site, unspecified stage: Secondary | ICD-10-CM | POA: Diagnosis present

## 2015-08-09 DIAGNOSIS — B351 Tinea unguium: Secondary | ICD-10-CM | POA: Diagnosis present

## 2015-08-09 DIAGNOSIS — B359 Dermatophytosis, unspecified: Secondary | ICD-10-CM | POA: Diagnosis present

## 2015-08-09 DIAGNOSIS — Z950 Presence of cardiac pacemaker: Secondary | ICD-10-CM

## 2015-08-09 DIAGNOSIS — L97829 Non-pressure chronic ulcer of other part of left lower leg with unspecified severity: Principal | ICD-10-CM | POA: Diagnosis present

## 2015-08-09 DIAGNOSIS — L8990 Pressure ulcer of unspecified site, unspecified stage: Secondary | ICD-10-CM | POA: Insufficient documentation

## 2015-08-09 DIAGNOSIS — E039 Hypothyroidism, unspecified: Secondary | ICD-10-CM | POA: Diagnosis present

## 2015-08-09 DIAGNOSIS — Z8249 Family history of ischemic heart disease and other diseases of the circulatory system: Secondary | ICD-10-CM | POA: Diagnosis not present

## 2015-08-09 DIAGNOSIS — L98499 Non-pressure chronic ulcer of skin of other sites with unspecified severity: Secondary | ICD-10-CM | POA: Diagnosis not present

## 2015-08-09 DIAGNOSIS — Z853 Personal history of malignant neoplasm of breast: Secondary | ICD-10-CM | POA: Diagnosis not present

## 2015-08-09 DIAGNOSIS — N289 Disorder of kidney and ureter, unspecified: Secondary | ICD-10-CM | POA: Diagnosis present

## 2015-08-09 DIAGNOSIS — L03115 Cellulitis of right lower limb: Secondary | ICD-10-CM | POA: Diagnosis present

## 2015-08-09 DIAGNOSIS — L97519 Non-pressure chronic ulcer of other part of right foot with unspecified severity: Secondary | ICD-10-CM | POA: Diagnosis not present

## 2015-08-09 HISTORY — DX: Other specified postprocedural states: Z98.890

## 2015-08-09 HISTORY — DX: Nausea with vomiting, unspecified: R11.2

## 2015-08-09 LAB — CBC WITH DIFFERENTIAL/PLATELET
BASOS ABS: 0 10*3/uL (ref 0.0–0.1)
BASOS PCT: 0 %
EOS PCT: 9 %
Eosinophils Absolute: 0.6 10*3/uL (ref 0.0–0.7)
HCT: 39.4 % (ref 36.0–46.0)
Hemoglobin: 12.7 g/dL (ref 12.0–15.0)
Lymphocytes Relative: 17 %
Lymphs Abs: 1.2 10*3/uL (ref 0.7–4.0)
MCH: 30.4 pg (ref 26.0–34.0)
MCHC: 32.2 g/dL (ref 30.0–36.0)
MCV: 94.3 fL (ref 78.0–100.0)
Monocytes Absolute: 0.7 10*3/uL (ref 0.1–1.0)
Monocytes Relative: 10 %
NEUTROS ABS: 4.5 10*3/uL (ref 1.7–7.7)
NEUTROS PCT: 64 %
PLATELETS: 283 10*3/uL (ref 150–400)
RBC: 4.18 MIL/uL (ref 3.87–5.11)
RDW: 15.3 % (ref 11.5–15.5)
WBC: 7 10*3/uL (ref 4.0–10.5)

## 2015-08-09 LAB — COMPREHENSIVE METABOLIC PANEL
ALK PHOS: 88 U/L (ref 38–126)
ALT: 13 U/L — ABNORMAL LOW (ref 14–54)
ANION GAP: 8 (ref 5–15)
AST: 14 U/L — ABNORMAL LOW (ref 15–41)
Albumin: 3.6 g/dL (ref 3.5–5.0)
BILIRUBIN TOTAL: 0.6 mg/dL (ref 0.3–1.2)
BUN: 15 mg/dL (ref 6–20)
CALCIUM: 9 mg/dL (ref 8.9–10.3)
CO2: 29 mmol/L (ref 22–32)
Chloride: 107 mmol/L (ref 101–111)
Creatinine, Ser: 1.29 mg/dL — ABNORMAL HIGH (ref 0.44–1.00)
GFR calc non Af Amer: 37 mL/min — ABNORMAL LOW (ref >60)
GFR, EST AFRICAN AMERICAN: 43 mL/min — AB (ref >60)
Glucose, Bld: 112 mg/dL — ABNORMAL HIGH (ref 65–99)
POTASSIUM: 4.1 mmol/L (ref 3.5–5.1)
Sodium: 144 mmol/L (ref 135–145)
TOTAL PROTEIN: 6.5 g/dL (ref 6.5–8.1)

## 2015-08-09 LAB — SEDIMENTATION RATE: Sed Rate: 20 mm/hr (ref 0–22)

## 2015-08-09 MED ORDER — ENOXAPARIN SODIUM 60 MG/0.6ML ~~LOC~~ SOLN
50.0000 mg | SUBCUTANEOUS | Status: DC
  Administered 2015-08-09 – 2015-08-14 (×6): 50 mg via SUBCUTANEOUS
  Filled 2015-08-09 (×6): qty 0.6

## 2015-08-09 MED ORDER — SODIUM CHLORIDE 0.9% FLUSH: 3.0000 mL | INTRAVENOUS | Status: DC | PRN

## 2015-08-09 MED ORDER — VANCOMYCIN HCL 10 G IV SOLR
1250.0000 mg | INTRAVENOUS | Status: DC
  Administered 2015-08-09 – 2015-08-10 (×2): 1250 mg via INTRAVENOUS
  Filled 2015-08-09 (×3): qty 1250

## 2015-08-09 MED ORDER — FUROSEMIDE 20 MG PO TABS
20.0000 mg | ORAL_TABLET | Freq: Every day | ORAL | Status: DC
  Administered 2015-08-10: 20 mg via ORAL
  Filled 2015-08-09: qty 1

## 2015-08-09 MED ORDER — ASPIRIN EC 81 MG PO TBEC
81.0000 mg | DELAYED_RELEASE_TABLET | Freq: Every day | ORAL | Status: DC
  Administered 2015-08-10 – 2015-08-14 (×5): 81 mg via ORAL
  Filled 2015-08-09 (×5): qty 1

## 2015-08-09 MED ORDER — SODIUM CHLORIDE 0.9% FLUSH
3.0000 mL | Freq: Two times a day (BID) | INTRAVENOUS | Status: DC
  Administered 2015-08-09 – 2015-08-13 (×5): 3 mL via INTRAVENOUS

## 2015-08-09 MED ORDER — ASPIRIN 81 MG PO CHEW
CHEWABLE_TABLET | ORAL | Status: AC
  Filled 2015-08-09: qty 1

## 2015-08-09 MED ORDER — LEVOTHYROXINE SODIUM 50 MCG PO TABS
50.0000 ug | ORAL_TABLET | Freq: Every day | ORAL | Status: DC
  Administered 2015-08-10 – 2015-08-14 (×5): 50 ug via ORAL
  Filled 2015-08-09 (×5): qty 1

## 2015-08-09 MED ORDER — ACETAMINOPHEN 650 MG RE SUPP: 650.0000 mg | Freq: Four times a day (QID) | RECTAL | Status: DC | PRN

## 2015-08-09 MED ORDER — VITAMIN D 1000 UNITS PO TABS
2000.0000 [IU] | ORAL_TABLET | Freq: Every day | ORAL | Status: DC
  Administered 2015-08-09 – 2015-08-14 (×6): 2000 [IU] via ORAL
  Filled 2015-08-09 (×6): qty 2

## 2015-08-09 MED ORDER — SODIUM CHLORIDE 0.9 % IV SOLN
250.0000 mL | INTRAVENOUS | Status: DC | PRN
  Administered 2015-08-09: 250 mL via INTRAVENOUS

## 2015-08-09 MED ORDER — ACETAMINOPHEN 325 MG PO TABS: 650.0000 mg | ORAL_TABLET | Freq: Four times a day (QID) | ORAL | Status: DC | PRN

## 2015-08-09 MED ORDER — PIPERACILLIN-TAZOBACTAM IN DEX 2-0.25 GM/50ML IV SOLN
2.2500 g | Freq: Four times a day (QID) | INTRAVENOUS | Status: DC
  Filled 2015-08-09: qty 50

## 2015-08-09 MED ORDER — PIPERACILLIN-TAZOBACTAM 3.375 G IVPB
3.3750 g | Freq: Three times a day (TID) | INTRAVENOUS | Status: DC
  Administered 2015-08-09 – 2015-08-14 (×16): 3.375 g via INTRAVENOUS
  Filled 2015-08-09 (×16): qty 50

## 2015-08-09 MED ORDER — FERROUS SULFATE 325 (65 FE) MG PO TABS: 325.0000 mg | ORAL_TABLET | Freq: Every day | ORAL | Status: DC | PRN

## 2015-08-09 MED ORDER — LETROZOLE 2.5 MG PO TABS
2.5000 mg | ORAL_TABLET | Freq: Every day | ORAL | Status: DC
  Administered 2015-08-10 – 2015-08-14 (×5): 2.5 mg via ORAL
  Filled 2015-08-09 (×6): qty 1

## 2015-08-09 NOTE — Progress Notes (Signed)
Pharmacy Antibiotic Note  [name] is a 82 y.o. female admitted on 08/09/2015 with wound infection.  Pharmacy has been consulted for vancomycin dosing. No current note yet available.   Plan: Vancomycin 1250 IV every 24 hours.  Goal trough 15-20 mcg/mL. Zosyn 3.375 gr IV q8h EI.     Temp (24hrs), Avg:97.5 F (36.4 C), Min:97.5 F (36.4 C), Max:97.5 F (36.4 C)  No results for input(s): WBC, CREATININE, LATICACIDVEN, VANCOTROUGH, VANCOPEAK, VANCORANDOM, GENTTROUGH, GENTPEAK, GENTRANDOM, TOBRATROUGH, TOBRAPEAK, TOBRARND, AMIKACINPEAK, AMIKACINTROU, AMIKACIN in the last 168 hours.  Estimated Creatinine Clearance: 38.4 mL/min (by C-G formula based on Cr of 1.3).    Allergies  Allergen Reactions  . Erythromycin Hives  . Tape     BAND AIDS-SKIN IRRITATION    Antimicrobials this admission: Vancomycin 4/12 >>  Zosyn 4/12 >>   Dose adjustments this admission: --- Microbiology results:   Thank you for allowing pharmacy to be a part of this patient's care.  Royetta Asal, PharmD, BCPS Pager [phone] 08/09/2015 1:08 PM

## 2015-08-09 NOTE — H&P (Signed)
[name] is an 82 y.o. female.   Chief Complaint: skin ulcer, cellulitis HPI: 82 yo female with hx of orthostatic hypotension, apparently c/o redness of the right distal lower ext and has skin ulcer on the bottom of the right foot.  Pt has been treated with avelox as outpatient and had only minimal improvement.  Pt is having yellow drainage from the bottom of her right foot ulcer and also foul smell.  Pt denies fever, chills.  Past Medical History  Diagnosis Date  . Invasive ductal carcinoma of breast ([place]) 03/2007    BILATERAL BREASTS  . Cataract   . Hypercholesterolemia   . Thyroid disease   . Complete heart block (HCC)     s/p PPM implant (MDT) by Dr Blanch Media.  Atrial lead could not be paced at time of the procedure.  She has chronic AV dysociation  . Orthostatic hypotension     treated with midodrine by Dr Rollene [name]  . Exogenous obesity   . Venous insufficiency   . PONV (postoperative nausea and vomiting)     Past Surgical History  Procedure Laterality Date  . Abdominal hysterectomy    . Cholecystectomy    . Cataract extraction      EYE SURGERY X 2  . Pacemaker insertion      MDT implanted by Dr Blanch Media.  Atrial lead placement was unsuccessful.  She has chronic AV dysociation  . Remote right radical nephrectomy  2009  . Breast lumpectomy Bilateral 2009  . Esophagogastroduodenoscopy N/A 02/16/2015    Procedure: ESOPHAGOGASTRODUODENOSCOPY (EGD);  Surgeon: Wilford Corner, MD;  Location: [facility] ENDOSCOPY;  Service: Endoscopy;  Laterality: N/A;  . Colonoscopy N/A 02/16/2015    Procedure: COLONOSCOPY;  Surgeon: Wilford Corner, MD;  Location: [facility] ENDOSCOPY;  Service: Endoscopy;  Laterality: N/A;  . Ep implantable device N/A 02/23/2015    Procedure: PPM Generator Changeout;  Surgeon: Will Meredith Leeds, MD;  Location: [place] [facility] CV LAB;  Service: Cardiovascular;  Laterality: N/A;    Family History  Problem Relation Age of Onset  . Heart disease Maternal Grandmother   .  Heart disease Mother    Social History:  reports that she has never smoked. She has never used smokeless tobacco. She reports that she does not drink alcohol or use illicit drugs.  Allergies:  Allergies  Allergen Reactions  . Erythromycin Hives  . Tape     BAND AIDS-SKIN IRRITATION    Medications Prior to Admission  Medication Sig Dispense Refill  . alendronate (FOSAMAX) 35 MG tablet TAKE 1 TABLET EVERY 7 DAYS WITH A FULL GLASS OF WATER ON AN EMPTY STOMACH 12 tablet 0  . aspirin 81 MG tablet Take 81 mg by mouth daily.    . Calcium Carbonate-Vitamin D (CALCIUM 600+D PO) Take 1 tablet by mouth daily.     . cholecalciferol 2000 UNITS tablet Take 1 capsule daily or as directed (Patient taking differently: Take 2,000 Units by mouth daily. ) 90 tablet 7  . ferrous sulfate 325 (65 FE) MG tablet Take 325 mg by mouth daily as needed (low iron).     . furosemide (LASIX) 20 MG tablet Take 20 mg by mouth daily.      Marland Kitchen letrozole (FEMARA) 2.5 MG tablet Take 1 tablet (2.5 mg total) by mouth daily. 90 tablet 3  . levothyroxine (SYNTHROID, LEVOTHROID) 50 MCG tablet Take 50 mcg by mouth daily.      Marland Kitchen moxifloxacin (AVELOX) 400 MG tablet Take 400 mg by mouth daily.  0  .  nitroGLYCERIN (NITRODUR - DOSED IN MG/24 HR) 0.1 mg/hr patch Place 0.1 mg onto the skin daily.   4  . WELCHOL 625 MG tablet Take 0.5 tablets by mouth 3 (three) times a week.   2  . midodrine (PROAMATINE) 2.5 MG tablet TAKE 1 TABLET TWICE A DAY 60 tablet 1  . traMADol (ULTRAM) 50 MG tablet Take 1 tablet (50 mg total) by mouth every 6 (six) hours as needed for severe pain. 11 tablet 0    No results found for this or any previous visit (from the past 48 hour(s)). No results found.  Review of Systems  Constitutional: Negative.   HENT: Negative.   Eyes: Negative.   Respiratory: Negative.   Cardiovascular: Negative.   Gastrointestinal: Negative.   Genitourinary: Negative.   Musculoskeletal: Negative.   Skin: Positive for rash.   Neurological: Negative.   Endo/Heme/Allergies: Negative.   Psychiatric/Behavioral: Negative.     Blood pressure 108/75, pulse 70, temperature 97.5 F (36.4 C), temperature source Oral, resp. rate 14, SpO2 93 %. Physical Exam  Constitutional: She is oriented to person, place, and time. She appears well-developed and well-nourished.  HENT:  Head: Normocephalic and atraumatic.  Eyes: Conjunctivae and EOM are normal. Pupils are equal, round, and reactive to light. No scleral icterus.  Neck: Normal range of motion. Neck supple. No JVD present. No tracheal deviation present. No thyromegaly present.  Cardiovascular: Normal rate and regular rhythm.  Exam reveals no gallop and no friction rub.   No murmur heard. Respiratory: Effort normal and breath sounds normal. No respiratory distress. She has no wheezes. She has no rales. She exhibits no tenderness.  GI: Soft. Bowel sounds are normal. She exhibits no distension. There is no tenderness. There is no rebound and no guarding.  Musculoskeletal: Normal range of motion. She exhibits edema. She exhibits no tenderness.  Trace edema  Lymphadenopathy:    She has no cervical adenopathy.  Neurological: She is alert and oriented to person, place, and time. She has normal reflexes. She displays normal reflexes. No cranial nerve deficit. She exhibits normal muscle tone. Coordination normal.  Skin: Rash noted. There is erythema. No pallor.  Psychiatric: She has a normal mood and affect. Her behavior is normal. Thought content normal.    1cm skin ulcer on the bottom of the right foot.  Yellow drainage.    Assessment/Plan Cellulitis, skin ulcer Wound culture Check esr tx with vanco iv, zosyn iv pharmacy to dose.   Renal insufficiency Cont monitor creatinine  Hypothyroidism Cont levothyroxine  Edema Cont lasix  Hx of breast cancer  Cont letrozole  DVT prophylaxis: lovenox  [name], MD 08/09/2015, 12:29 PM

## 2015-08-10 MED ORDER — CLOTRIMAZOLE 1 % EX CREA
TOPICAL_CREAM | Freq: Two times a day (BID) | CUTANEOUS | Status: DC
  Administered 2015-08-10 – 2015-08-14 (×7): via TOPICAL
  Filled 2015-08-10 (×2): qty 15

## 2015-08-10 NOTE — Progress Notes (Signed)
Subjective: Cellulitis, skin ulcer Right distal lower ext,  Still with slight yellow drainage,  Less foul odor.  Pt afebrile.    Renal insufficiency,  Similar to baseline creatinine  Hypothyroidism denies heat/cold intolerance.   Objective: Vital signs in last 24 hours: Temp:  [97.5 F (36.4 C)-97.8 F (36.6 C)] 97.8 F (36.6 C) (04/12 2145) Pulse Rate:  [61-70] 61 (04/12 1408) Resp:  [14-16] 16 (04/12 2145) BP: (101-113)/(52-75) 112/62 mmHg (04/12 2234) SpO2:  [93 %-95 %] 95 % (04/12 2145) Weight:  [98.884 kg (218 lb)] 98.884 kg (218 lb) (04/12 1408) Weight change:  Last BM Date: 08/09/15  Intake/Output from previous day:   Intake/Output this shift:    Heent: anicteric Neck: no jvd Heart: rrr s1, s2 Lung: ctab Abd: soft, obese Ext: no c/c  Trace edema,  Redness of the right distal lower ext about 2/3 up to knee,  Skin ulcer on the bottom of right ft 1cm,  58mm deep Onychomycosis,  tinea  Lab Results:  Recent Labs  08/09/15 1302  WBC 7.0  HGB 12.7  HCT 39.4  PLT 283   BMET  Recent Labs  08/09/15 1302  NA 144  K 4.1  CL 107  CO2 29  GLUCOSE 112*  BUN 15  CREATININE 1.29*  CALCIUM 9.0    Studies/Results: No results found.  Medications: I have reviewed the patient's current medications.  Assessment/Plan: Skin ulcer, cellulitis,  Awaiting wound culture Appreciate pharmacy input,  Cont vanco iv, zosyn iv  Renal insufficiency Check cmp tomorrow am  Tinea Clotrimazole cream topically bid  Hyperglycemia Check hga1c  Hypothyroidism Cont levothyroxine  Hx of breast cancer  Cont letrozole  DVT prophylaxis: lovenox    LOS: 1 day   [name] 08/10/2015, 7:20 AM

## 2015-08-11 LAB — COMPREHENSIVE METABOLIC PANEL
ALBUMIN: 3.1 g/dL — AB (ref 3.5–5.0)
ALT: 11 U/L — AB (ref 14–54)
AST: 13 U/L — ABNORMAL LOW (ref 15–41)
Alkaline Phosphatase: 70 U/L (ref 38–126)
Anion gap: 10 (ref 5–15)
BUN: 17 mg/dL (ref 6–20)
CHLORIDE: 111 mmol/L (ref 101–111)
CO2: 26 mmol/L (ref 22–32)
CREATININE: 1.52 mg/dL — AB (ref 0.44–1.00)
Calcium: 8.7 mg/dL — ABNORMAL LOW (ref 8.9–10.3)
GFR calc Af Amer: 35 mL/min — ABNORMAL LOW (ref >60)
GFR, EST NON AFRICAN AMERICAN: 30 mL/min — AB (ref >60)
GLUCOSE: 86 mg/dL (ref 65–99)
POTASSIUM: 3.8 mmol/L (ref 3.5–5.1)
SODIUM: 147 mmol/L — AB (ref 135–145)
Total Bilirubin: 0.5 mg/dL (ref 0.3–1.2)
Total Protein: 5.8 g/dL — ABNORMAL LOW (ref 6.5–8.1)

## 2015-08-11 LAB — CBC
HCT: 39.5 % (ref 36.0–46.0)
HEMOGLOBIN: 12.3 g/dL (ref 12.0–15.0)
MCH: 29.9 pg (ref 26.0–34.0)
MCHC: 31.1 g/dL (ref 30.0–36.0)
MCV: 96.1 fL (ref 78.0–100.0)
PLATELETS: 272 10*3/uL (ref 150–400)
RBC: 4.11 MIL/uL (ref 3.87–5.11)
RDW: 16 % — ABNORMAL HIGH (ref 11.5–15.5)
WBC: 5.9 10*3/uL (ref 4.0–10.5)

## 2015-08-11 LAB — TSH: TSH: 3.28 u[IU]/mL (ref 0.350–4.500)

## 2015-08-11 LAB — VANCOMYCIN, RANDOM: Vancomycin Rm: 12 ug/mL

## 2015-08-11 MED ORDER — FUROSEMIDE 40 MG PO TABS
40.0000 mg | ORAL_TABLET | Freq: Every day | ORAL | Status: DC
  Administered 2015-08-11: 40 mg via ORAL
  Filled 2015-08-11: qty 1

## 2015-08-11 MED ORDER — VANCOMYCIN HCL 10 G IV SOLR
1250.0000 mg | INTRAVENOUS | Status: DC
  Administered 2015-08-11 – 2015-08-13 (×3): 1250 mg via INTRAVENOUS
  Filled 2015-08-11 (×3): qty 1250

## 2015-08-11 NOTE — Progress Notes (Signed)
Pharmacy Antibiotic Note  [name] is a 82 y.o. female admitted on 08/09/2015 with wound infection.  Pharmacy has been consulted for vancomycin dosing. No current note yet available.   Plan:  - Random vancomycin level = 12 mcg/ml, continue vancomycin 1250mg  IV q24h as some accumulation expected with rising SCr - Zosyn 3.375g IV q8h (infuse over 4 hours) - F/U SCr in AM   Height: 5\' 7"  (170.2 cm) Weight: 224 lb 10.4 oz (101.9 kg) IBW/kg (Calculated) : 61.6  Temp (24hrs), Avg:97.5 F (36.4 C), Min:97.4 F (36.3 C), Max:97.6 F (36.4 C)   Recent Labs Lab 08/09/15 1302 08/11/15 0345 08/11/15 1403  WBC 7.0 5.9  --   CREATININE 1.29* 1.52*  --   VANCORANDOM  --   --  12    Estimated Creatinine Clearance: 33.2 mL/min (by C-G formula based on Cr of 1.52).    Allergies  Allergen Reactions  . Erythromycin Hives  . Tape     BAND AIDS-SKIN IRRITATION    Antimicrobials this admission: Vancomycin 4/12 >>  Zosyn 4/12 >>    Dose adjustments this admission: 4/14 Random vanc = 12 mcg/ml on 1250mg  IV q24h  Microbiology results: 4/12 R foot wound: cx reincubating  Thank you for allowing pharmacy to be a part of this patient's care.  Peggyann Juba, PharmD, BCPS Pager: [phone]  08/11/2015, 4:10 PM

## 2015-08-11 NOTE — Care Management Note (Signed)
Case Management Note  Patient Details  Name: [name] MRN: [id number] Date of Birth: 05-Nov-1927  Subjective/Objective:           82 yo admitted with Cellulitis         Action/Plan: From home alone.  She is a widower and has been using the services of [facility] for wound care on Tuesdays and Thursdays.  This was confirmed with Arville Go Rep.  Will need MD to place resumption orders at DC.  Expected Discharge Date:   (UNKNOWN)               Expected Discharge Plan:  [place]  In-House Referral:     Discharge planning Services  CM Consult  Post Acute Care Choice:  Resumption of Svcs/PTA Provider Choice offered to:     DME Arranged:    DME Agency:     HH Arranged:  RN [place] Agency:  [facility]  Status of Service:  In process, will continue to follow  Medicare Important Message Given:    Date Medicare IM Given:    Medicare IM give by:    Date Additional Medicare IM Given:    Additional Medicare Important Message give by:     If discussed at [place] of Stay Meetings, dates discussed:    Additional CommentsLynnell Catalan, RN 08/11/2015, 10:25 AM [phone]

## 2015-08-11 NOTE — Progress Notes (Signed)
Pharmacy Antibiotic Note  [name] is a 82 y.o. female admitted on 08/09/2015 with wound infection.  Pharmacy has been consulted for vancomycin dosing. No current note yet available.   Plan: Vancomycin 1250 IV every 24 hours.  Goal trough 15-20 mcg/mL. Zosyn 3.375g IV q8h (infuse over 4 hours) SCr bumped today.  Hold vancomycin and check level.    Height: 5\' 7"  (170.2 cm) Weight: 224 lb 10.4 oz (101.9 kg) IBW/kg (Calculated) : 61.6  Temp (24hrs), Avg:97.7 F (36.5 C), Min:97.4 F (36.3 C), Max:98.1 F (36.7 C)   Recent Labs Lab 08/09/15 1302 08/11/15 0345  WBC 7.0 5.9  CREATININE 1.29* 1.52*    Estimated Creatinine Clearance: 33.2 mL/min (by C-G formula based on Cr of 1.52).    Allergies  Allergen Reactions  . Erythromycin Hives  . Tape     BAND AIDS-SKIN IRRITATION    Antimicrobials this admission: Vancomycin 4/12 >>  Zosyn 4/12 >>   Dose adjustments this admission: 4/14 Random vanc = ______ on 1250mg  IV q24h  Microbiology results:   Thank you for allowing pharmacy to be a part of this patient's care.  Ralene Bathe, PharmD, BCPS 08/11/2015, 11:10 AM  Pager: [phone]

## 2015-08-11 NOTE — Progress Notes (Signed)
Subjective: Cellulitis/ skin ulcer: right foot, denies fever, chills,  Nursing staff thinks that wound is slightly larger today?  Still slight redness.   Edema: slight swelling bilateral distal lower ext Renal insufficiency, Similar to baseline creatinine Hypothyroidism denies heat/cold intolerance.    Objective: Vital signs in last 24 hours: Temp:  [97.4 F (36.3 C)-98.1 F (36.7 C)] 97.6 F (36.4 C) (04/14 0450) Pulse Rate:  [60-66] 60 (04/14 0450) Resp:  [16-18] 16 (04/14 0450) BP: (104-115)/(52-81) 104/52 mmHg (04/14 0450) SpO2:  [95 %-99 %] 95 % (04/14 0450) Weight:  [101.9 kg (224 lb 10.4 oz)] 101.9 kg (224 lb 10.4 oz) (04/14 0450) Weight change: 3.016 kg (6 lb 10.4 oz) Last BM Date: 08/10/15  Intake/Output from previous day: 04/13 0701 - 04/14 0700 In: 1700 [P.O.:1700] Out: 700 [Urine:700] Intake/Output this shift:    Heent: anicteric Neck: no jvd Heart: rrr s1, s2,  Lung: ctab Abd: soft Ext: no c/c,  Trace edema.  Onychomycosis, tinea.  Slight redness about 2/3 up to knee right distal lower ext,  0.8cm skin ulcer  38mm deep, no yellow drainage this am. Pink granulation tissue visible  Lab Results:  Recent Labs  08/09/15 1302 08/11/15 0345  WBC 7.0 5.9  HGB 12.7 12.3  HCT 39.4 39.5  PLT 283 272   BMET  Recent Labs  08/09/15 1302 08/11/15 0345  NA 144 147*  K 4.1 3.8  CL 107 111  CO2 29 26  GLUCOSE 112* 86  BUN 15 17  CREATININE 1.29* 1.52*  CALCIUM 9.0 8.7*    Studies/Results: No results found.  Medications: I have reviewed the patient's current medications.  Assessment/Plan: Cellulitis/ skin ulcer Wound care consult this am Cont vanco, zosyn iv  Tinea, cont clotrimazole  Renal insufficiency slightly worse Check cmp in am   Hypothyroidism Cont levothyroxine  Hyperglycemia Awaiting hga1c  DVT lovenox   LOS: 2 days   [name] 08/11/2015, 7:07 AM

## 2015-08-12 LAB — BASIC METABOLIC PANEL
ANION GAP: 10 (ref 5–15)
BUN: 18 mg/dL (ref 6–20)
CHLORIDE: 108 mmol/L (ref 101–111)
CO2: 28 mmol/L (ref 22–32)
Calcium: 8.3 mg/dL — ABNORMAL LOW (ref 8.9–10.3)
Creatinine, Ser: 1.52 mg/dL — ABNORMAL HIGH (ref 0.44–1.00)
GFR, EST AFRICAN AMERICAN: 35 mL/min — AB (ref >60)
GFR, EST NON AFRICAN AMERICAN: 30 mL/min — AB (ref >60)
Glucose, Bld: 86 mg/dL (ref 65–99)
POTASSIUM: 3.9 mmol/L (ref 3.5–5.1)
SODIUM: 146 mmol/L — AB (ref 135–145)

## 2015-08-12 LAB — HEMOGLOBIN A1C
Hgb A1c MFr Bld: 6 % — ABNORMAL HIGH (ref 4.8–5.6)
Mean Plasma Glucose: 126 mg/dL

## 2015-08-12 LAB — WOUND CULTURE: Special Requests: NORMAL

## 2015-08-12 NOTE — Progress Notes (Signed)
Subjective: Cellulitis/ Skin ulcer: redness less on the right distal lower ext. Skin ulcer on the bottom of the right foot, has packing in it this am.  Not sure what kind of packing was used, ? calclium alginate.   Renal insufficiency: stable Edema: slight swelling bilateral distal lower ext Hypothyroidism denies heat/cold intolerance.    Objective: Vital signs in last 24 hours: Temp:  [97.4 F (36.3 C)-98.1 F (36.7 C)] 98.1 F (36.7 C) (04/15 0505) Pulse Rate:  [62-67] 62 (04/15 0505) Resp:  [16-18] 16 (04/15 0505) BP: (97-124)/(62-89) 97/62 mmHg (04/15 0505) SpO2:  [92 %-98 %] 97 % (04/15 0505) Weight:  [101.923 kg (224 lb 11.2 oz)] 101.923 kg (224 lb 11.2 oz) (04/15 0505) Weight change: 0.023 kg (0.8 oz) Last BM Date: 08/10/15  Intake/Output from previous day: 04/14 0701 - 04/15 0700 In: 1595 [P.O.:1595] Out: 2400 [Urine:2400] Intake/Output this shift:    Heent: anicteric Neck: no jvd Heart: rrr s1, s2 Lung: ctab Abd: soft Ext: no c/c/e Skin: slight redness about 1/2 up to knee,  Right distal lower ext,   0.8cm skin ulcer, without any yellow drainage.   Lab Results:  Recent Labs  08/09/15 1302 08/11/15 0345  WBC 7.0 5.9  HGB 12.7 12.3  HCT 39.4 39.5  PLT 283 272   BMET  Recent Labs  08/11/15 0345 08/12/15 0343  NA 147* 146*  K 3.8 3.9  CL 111 108  CO2 26 28  GLUCOSE 86 86  BUN 17 18  CREATININE 1.52* 1.52*  CALCIUM 8.7* 8.3*    Studies/Results: No results found.  Medications: I have reviewed the patient's current medications.  Assessment/Plan: Cellulitis/skin ulcer: improved, will cont iv abx.   Tinea, cont clotrimazole  Renal insufficiency stable Check cmp in am   Hypothyroidism Cont levothyroxine  Hyperglycemia, hga1c=6 Prediabetes  DVT lovenox     LOS: 3 days   [name] 08/12/2015, 7:15 AM

## 2015-08-13 LAB — COMPREHENSIVE METABOLIC PANEL
ALBUMIN: 3.1 g/dL — AB (ref 3.5–5.0)
ALT: 10 U/L — AB (ref 14–54)
AST: 14 U/L — AB (ref 15–41)
Alkaline Phosphatase: 64 U/L (ref 38–126)
Anion gap: 8 (ref 5–15)
BILIRUBIN TOTAL: 0.5 mg/dL (ref 0.3–1.2)
BUN: 18 mg/dL (ref 6–20)
CHLORIDE: 111 mmol/L (ref 101–111)
CO2: 26 mmol/L (ref 22–32)
CREATININE: 1.49 mg/dL — AB (ref 0.44–1.00)
Calcium: 8.4 mg/dL — ABNORMAL LOW (ref 8.9–10.3)
GFR calc Af Amer: 36 mL/min — ABNORMAL LOW (ref >60)
GFR, EST NON AFRICAN AMERICAN: 31 mL/min — AB (ref >60)
GLUCOSE: 96 mg/dL (ref 65–99)
Potassium: 3.7 mmol/L (ref 3.5–5.1)
Sodium: 145 mmol/L (ref 135–145)
TOTAL PROTEIN: 6 g/dL — AB (ref 6.5–8.1)

## 2015-08-13 LAB — CBC WITH DIFFERENTIAL/PLATELET
BASOS ABS: 0 10*3/uL (ref 0.0–0.1)
BASOS PCT: 1 %
Eosinophils Absolute: 1.1 10*3/uL — ABNORMAL HIGH (ref 0.0–0.7)
Eosinophils Relative: 15 %
HEMATOCRIT: 37.4 % (ref 36.0–46.0)
Hemoglobin: 11.9 g/dL — ABNORMAL LOW (ref 12.0–15.0)
LYMPHS PCT: 14 %
Lymphs Abs: 1 10*3/uL (ref 0.7–4.0)
MCH: 29.9 pg (ref 26.0–34.0)
MCHC: 31.8 g/dL (ref 30.0–36.0)
MCV: 94 fL (ref 78.0–100.0)
Monocytes Absolute: 0.7 10*3/uL (ref 0.1–1.0)
Monocytes Relative: 10 %
NEUTROS ABS: 4.2 10*3/uL (ref 1.7–7.7)
NEUTROS PCT: 60 %
Platelets: 244 10*3/uL (ref 150–400)
RBC: 3.98 MIL/uL (ref 3.87–5.11)
RDW: 15.6 % — ABNORMAL HIGH (ref 11.5–15.5)
WBC: 6.9 10*3/uL (ref 4.0–10.5)

## 2015-08-13 LAB — VANCOMYCIN, TROUGH: Vancomycin Tr: 18 ug/mL (ref 10.0–20.0)

## 2015-08-13 MED ORDER — FUROSEMIDE 20 MG PO TABS
20.0000 mg | ORAL_TABLET | Freq: Every day | ORAL | Status: DC
  Administered 2015-08-13 – 2015-08-14 (×2): 20 mg via ORAL
  Filled 2015-08-13 (×2): qty 1

## 2015-08-13 MED ORDER — VANCOMYCIN HCL IN DEXTROSE 1-5 GM/200ML-% IV SOLN: 1000.0000 mg | INTRAVENOUS | Status: DC

## 2015-08-13 MED ORDER — GLUCERNA SHAKE PO LIQD
237.0000 mL | Freq: Three times a day (TID) | ORAL | Status: DC
  Administered 2015-08-13 – 2015-08-14 (×3): 237 mL via ORAL
  Filled 2015-08-13 (×8): qty 237

## 2015-08-13 NOTE — Progress Notes (Signed)
Pharmacy Antibiotic Note  [name] is a 82 y.o. female admitted on 08/09/2015 with wound infection.  Pharmacy has been consulted for vancomycin dosing. No current note yet available.   Plan:  - Continue vancomycin 1250mg  IV q24 for now - will check trough prior to next dose this PM - Continue Zosyn 3.375g IV q8h (infuse over 4 hours) - What is plan for abx's? Change to PO such as clindamycin?   Height: 5\' 7"  (170.2 cm) Weight: 222 lb 11.2 oz (101.016 kg) IBW/kg (Calculated) : 61.6  Temp (24hrs), Avg:97.7 F (36.5 C), Min:97.6 F (36.4 C), Max:97.9 F (36.6 C)   Recent Labs Lab 08/09/15 1302 08/11/15 0345 08/11/15 1403 08/12/15 0343 08/13/15 0419  WBC 7.0 5.9  --   --  6.9  CREATININE 1.29* 1.52*  --  1.52* 1.49*  VANCORANDOM  --   --  12  --   --     Estimated Creatinine Clearance: 33.7 mL/min (by C-G formula based on Cr of 1.49).    Allergies  Allergen Reactions  . Erythromycin Hives  . Tape     BAND AIDS-SKIN IRRITATION    Antimicrobials this admission: Vancomycin 4/12 >>  Zosyn 4/12 >>    Dose adjustments this admission: 4/14 Random vanc = 12 mcg/ml on 1250mg  IV q24h  Microbiology results: 4/12 R foot wound: multiple, none predominant  Thank you for allowing pharmacy to be a part of this patient's care.  Adrian Saran, PharmD, BCPS Pager [phone] 08/13/2015 9:55 AM

## 2015-08-13 NOTE — Progress Notes (Signed)
Subjective: Skin ulcer/ cellulitis:  Pt tolerating abx. No yellow drainage.  Pt has been afebrile.   Protein calorie malnutrition:  Pt amenable to starting ensure.  Renal insufficiency imrpoved.  Hypothyroidism: denies heat/cold intolerance.  Hyperglycemia: prediabetes.   Objective: Vital signs in last 24 hours: Temp:  [97.6 F (36.4 C)-97.9 F (36.6 C)] 97.6 F (36.4 C) (04/16 0542) Pulse Rate:  [61-63] 61 (04/16 0542) Resp:  [16-18] 18 (04/16 0542) BP: (121-130)/(61-78) 121/61 mmHg (04/16 0542) SpO2:  [96 %-97 %] 96 % (04/16 0542) Weight:  [101.016 kg (222 lb 11.2 oz)] 101.016 kg (222 lb 11.2 oz) (04/16 0537) Weight change: -0.907 kg (-2 lb) Last BM Date: 08/11/15  Intake/Output from previous day: 04/15 0701 - 04/16 0700 In: 480 [P.O.:480] Out: 1050 [Urine:1050] Intake/Output this shift:    Heent: anicteric Neck: no jvd Heart: rrr s1, s2 Lung: ctab Abd: soft Ext: no c/c/e,  Slight redness right distal lower ext,  39mm x 45mm superficial skin ulceration on the left distal mid tibia area.  0.8cm skin ulcer on the bottom of the right ft,  About 89mm deep, improving,  Pink granulation tissue, no yellow discharge.    Lab Results:  Recent Labs  08/11/15 0345 08/13/15 0419  WBC 5.9 6.9  HGB 12.3 11.9*  HCT 39.5 37.4  PLT 272 244   BMET  Recent Labs  08/12/15 0343 08/13/15 0419  NA 146* 145  K 3.9 3.7  CL 108 111  CO2 28 26  GLUCOSE 86 96  BUN 18 18  CREATININE 1.52* 1.49*  CALCIUM 8.3* 8.4*    Studies/Results: No results found.  Medications: I have reviewed the patient's current medications.  Assessment/Plan: Cellulitis/skin ulcer:  Cont iv abx, vanco, zosyn Wound care consult   Renal insufficiency improving  Protein calorie malnutition Start on glucerna  Tinea  Cont clotrimazole  Hypothyroidism Cont levothyroxine  Hyperglycemia (prediabetes)   DVT prophylaxis: lovenox Time spent 35 minutes on care     LOS: 4 days   [name] 08/13/2015, 7:20 AM

## 2015-08-13 NOTE — Progress Notes (Signed)
Pharmacy Antibiotic Note  [name] is a 82 y.o. female admitted on 08/09/2015 with ulcer on right foot and cellulitis of RLE, no purulent drainage noted.  Pharmacy has been consulted for vancomycin dosing.   Plan:  Decrease to Vancomycin 1000 mg IV q24h with next dose on 4/17.  Follow up renal fxn, culture results, and clinical course.  Follow up antibiotic plan.    Recent Labs Lab 08/09/15 1302 08/11/15 0345 08/11/15 1403 08/12/15 0343 08/13/15 0419  WBC 7.0 5.9  --   --  6.9  CREATININE 1.29* 1.52*  --  1.52* 1.49*  VANCORANDOM  --   --  12  --   --     Estimated Creatinine Clearance: 33.7 mL/min (by C-G formula based on Cr of 1.49).    Antimicrobials this admission: Vancomycin 4/12 >>  Zosyn 4/12 >>    Dose adjustments this admission: 4/14 Random vanc = 12 mcg/ml on 1250mg  IV q24h 4/16 VT = 18, above goal VT 10-15 for cellulitis on 1250mg  IV q24h  Microbiology results: 4/12 R foot wound: multiple, none predominant  Thank you for allowing pharmacy to be a part of this patient's care.  Gretta Arab PharmD, BCPS Pager [phone] 08/13/2015 5:44 PM

## 2015-08-14 LAB — COMPREHENSIVE METABOLIC PANEL
ALT: 10 U/L — ABNORMAL LOW (ref 14–54)
ANION GAP: 9 (ref 5–15)
AST: 16 U/L (ref 15–41)
Albumin: 3.2 g/dL — ABNORMAL LOW (ref 3.5–5.0)
Alkaline Phosphatase: 66 U/L (ref 38–126)
BUN: 18 mg/dL (ref 6–20)
CHLORIDE: 111 mmol/L (ref 101–111)
CO2: 25 mmol/L (ref 22–32)
CREATININE: 1.16 mg/dL — AB (ref 0.44–1.00)
Calcium: 8.6 mg/dL — ABNORMAL LOW (ref 8.9–10.3)
GFR, EST AFRICAN AMERICAN: 48 mL/min — AB (ref >60)
GFR, EST NON AFRICAN AMERICAN: 42 mL/min — AB (ref >60)
Glucose, Bld: 89 mg/dL (ref 65–99)
POTASSIUM: 3.8 mmol/L (ref 3.5–5.1)
SODIUM: 145 mmol/L (ref 135–145)
Total Bilirubin: 0.4 mg/dL (ref 0.3–1.2)
Total Protein: 6 g/dL — ABNORMAL LOW (ref 6.5–8.1)

## 2015-08-14 MED ORDER — AMOXICILLIN-POT CLAVULANATE 875-125 MG PO TABS: 1.0000 | ORAL_TABLET | Freq: Two times a day (BID) | ORAL | Status: DC

## 2015-08-14 MED ORDER — CLOTRIMAZOLE 1 % EX CREA: TOPICAL_CREAM | Freq: Two times a day (BID) | CUTANEOUS | Status: DC

## 2015-08-14 MED ORDER — GLUCERNA SHAKE PO LIQD: 237.0000 mL | Freq: Three times a day (TID) | ORAL | Status: DC

## 2015-08-14 NOTE — Consult Note (Signed)
WOC wound consult note Reason for Consult:Left anterior pretibial open wound.  Right plantar foot wound near first and second metatarsals.  Wound type:Neuropathic/infectious Pressure Ulcer POA: Yes Measurement:Right plantar foot 3 cm x 3 cm calloused area with 1.5 cm x 1 cm x 0.2 cm open wound in center.  LEft anterior leg 0.5 cm diameter nonintact lesion with 1 cm erythema present circumferentially.  Patient states edema and erythema are much improved.  Has [facility] nurse twice weekly but is not sure what treatment they are providing.  Wound bed: Ruddy red Drainage (amount, consistency, odor) Moderate serosanguinous  No odor.  Periwound:Erythema and bilateral lower legs edematous.  Dressing procedure/placement/frequency:Cleanse wound to left anterior leg with NS and pat gently dry.  Apply xeroform gauze to wound bed.  Cover with 4x4 gauze and kerlix.  Secure with tape.  Change daily.   Cleanse right plantar foot wound with NS and pat gently dry.  Apply Xeroform gauze to wound bed.  Cover with 4x4 gauze and kerlix. Secure with tape.  Change daily.  Will not follow at this time.  Please re-consult if needed.  Domenic Moras RN BSN [place] Pager [phone]

## 2015-08-14 NOTE — Discharge Summary (Addendum)
Physician Discharge Summary  Patient ID: [name] MRN: [id number] DOB/AGE: Sep 22, 1927 82 y.o.  Admit date: 08/09/2015 Discharge date: 08/14/2015  Admission Diagnoses: Cellulitis, Skin ulcer Renal insufficiency Hypothyroidism   Discharge Diagnoses:  Active Problems:   Cellulitis   Pressure ulcer Hyperglycemia, glucose intolerance.  Renal insufficiency Hypothyroidism Protein Calorie malnutrition   Discharged Condition: stable  Hospital Course: 82 yo female with redness of the right distal lower ext, and skin ulcer 0.8cm,  Nonhealing,  With yellow drainage.  Failing outpatient abx.  Pt admitted for iv abx.  Pt remained afebrile.  Skin ulcer appears to be healing, and redness/ cellulitis has disappeared. Pt was noted to have protein calorie malnutrition.  Pt had wound culture that showed multiple organisms.   Consults: None  Significant Diagnostic Studies: labs:   Treatments: iv abx (vanco, zosyn)  Discharge Exam: Blood pressure 121/68, pulse 70, temperature 97.4 F (36.3 C), temperature source Oral, resp. rate 18, height 5\' 7"  (1.702 m), weight 102.014 kg (224 lb 14.4 oz), SpO2 99 %.  Heent: anicteric Neck: no jvd Heart: rrr s1, s2 Lung: ctab Abd: soft Ext: no c/c/e Skin: 0.7cm skin ulcer on the bottom of the right foot.  Redness disappeared.    A/P Cellulitis/ skin ulcer Healing,   D/c iv vanco, zosyn,   Start on augmentin 875mg  po bid x 10 days.   Renal insufficiency Check cmp in 1 week.   Hypothyroidism Cont levothyroxine  Tinea Cont clotrimazole 1% between her toes  Protein calorie malnutrition Cont glucerna  Glucose intolerance.   Stable,    F/u with Dr. Maudie Mercury Friday 8am.     Disposition: 01-Home or Self Care     Medication List    STOP taking these medications        ferrous sulfate 325 (65 FE) MG tablet     midodrine 2.5 MG tablet  Commonly known as:  PROAMATINE     moxifloxacin 400 MG tablet  Commonly known as:  AVELOX      traMADol 50 MG tablet  Commonly known as:  ULTRAM     WELCHOL 625 MG tablet  Generic drug:  colesevelam      TAKE these medications        alendronate 35 MG tablet  Commonly known as:  FOSAMAX  TAKE 1 TABLET EVERY 7 DAYS WITH A FULL GLASS OF WATER ON AN EMPTY STOMACH     amoxicillin-clavulanate 875-125 MG tablet  Commonly known as:  AUGMENTIN  Take 1 tablet by mouth 2 (two) times daily.     aspirin 81 MG tablet  Take 81 mg by mouth daily.     CALCIUM 600+D PO  Take 1 tablet by mouth daily.     clotrimazole 1 % cream  Commonly known as:  LOTRIMIN  Apply topically 2 (two) times daily.     feeding supplement (GLUCERNA SHAKE) Liqd  Take 237 mLs by mouth 3 (three) times daily between meals.     furosemide 20 MG tablet  Commonly known as:  LASIX  Take 20 mg by mouth daily.     letrozole 2.5 MG tablet  Commonly known as:  FEMARA  Take 1 tablet (2.5 mg total) by mouth daily.     levothyroxine 50 MCG tablet  Commonly known as:  SYNTHROID, LEVOTHROID  Take 50 mcg by mouth daily.     Vitamin D 2000 units tablet  Take 1 capsule daily or as directed      ASK your doctor about these medications  nitroGLYCERIN 0.1 mg/hr patch  Commonly known as:  NITRODUR - Dosed in mg/24 hr  Place 0.1 mg onto the skin daily.         Signed: [name] 08/14/2015, 3:26 PM

## 2015-08-18 DIAGNOSIS — L98499 Non-pressure chronic ulcer of skin of other sites with unspecified severity: Secondary | ICD-10-CM | POA: Diagnosis not present

## 2015-08-24 DIAGNOSIS — L98499 Non-pressure chronic ulcer of skin of other sites with unspecified severity: Secondary | ICD-10-CM | POA: Diagnosis not present

## 2015-08-25 ENCOUNTER — Encounter: Payer: Self-pay | Admitting: Internal Medicine

## 2015-08-25 ENCOUNTER — Ambulatory Visit (INDEPENDENT_AMBULATORY_CARE_PROVIDER_SITE_OTHER): Payer: Medicare Other | Admitting: Internal Medicine

## 2015-08-25 VITALS — BP 110/78 | HR 85 | Ht 67.0 in | Wt 217.8 lb

## 2015-08-25 DIAGNOSIS — I442 Atrioventricular block, complete: Secondary | ICD-10-CM

## 2015-08-25 DIAGNOSIS — I872 Venous insufficiency (chronic) (peripheral): Secondary | ICD-10-CM

## 2015-08-25 LAB — CUP PACEART INCLINIC DEVICE CHECK
Battery Impedance: 159 Ohm
Battery Remaining Longevity: 110 mo
Battery Voltage: 2.79 V
Lead Channel Impedance Value: 398 Ohm
Lead Channel Pacing Threshold Amplitude: 1 V
Lead Channel Pacing Threshold Pulse Width: 0.4 ms
Lead Channel Setting Pacing Amplitude: 2 V
Lead Channel Setting Pacing Pulse Width: 0.4 ms
MDC IDC LEAD IMPLANT DT: 20141208
MDC IDC LEAD LOCATION: 753860
MDC IDC MSMT LEADCHNL RA IMPEDANCE VALUE: 0 Ohm
MDC IDC MSMT LEADCHNL RV PACING THRESHOLD AMPLITUDE: 1 V
MDC IDC MSMT LEADCHNL RV PACING THRESHOLD PULSEWIDTH: 0.4 ms
MDC IDC SESS DTM: 20170428145323
MDC IDC SET LEADCHNL RV SENSING SENSITIVITY: 2.8 mV
MDC IDC STAT BRADY RV PERCENT PACED: 100 %

## 2015-08-25 NOTE — Progress Notes (Signed)
[name], MD: Primary Cardiologist:  Dr [place]  [name] is a 82 y.o. female with a h/o complete heart block sp PPM (MDT) by Dr Blanch Media who presents today for follow-up in the Electrophysiology device clinic. She appears to be doing reasonably well at this time.  She is s/p gen change by me in October.  With her initial implant by Dr Blanch Media, atrial lead access was unsuccessful.  She has done well with VVI pacing and declined lead revision at time of her recent gen change.  Today, she  denies symptoms of palpitations, chest pain, shortness of breath, orthopnea, PND,  dizziness, presyncope, syncope, or neurologic sequela.  She has venous insufficiency and has been treated by PCP for cellulitis.  The patientis tolerating medications without difficulties and is otherwise without complaint today.   Past Medical History  Diagnosis Date  . Invasive ductal carcinoma of breast ([place]) 03/2007    BILATERAL BREASTS  . Cataract   . Hypercholesterolemia   . Thyroid disease   . Complete heart block (HCC)     s/p PPM implant (MDT) by Dr Blanch Media.  Atrial lead could not be paced at time of the procedure.  She has chronic AV dysociation  . Orthostatic hypotension     treated with midodrine by Dr Rollene [name]  . Exogenous obesity   . Venous insufficiency   . PONV (postoperative nausea and vomiting)    Past Surgical History  Procedure Laterality Date  . Abdominal hysterectomy    . Cholecystectomy    . Cataract extraction      EYE SURGERY X 2  . Pacemaker insertion      MDT implanted by Dr Blanch Media.  Atrial lead placement was unsuccessful.  She has chronic AV dysociation  . Remote right radical nephrectomy  2009  . Breast lumpectomy Bilateral 2009  . Esophagogastroduodenoscopy N/A 02/16/2015    Procedure: ESOPHAGOGASTRODUODENOSCOPY (EGD);  Surgeon: Wilford Corner, MD;  Location: [facility] ENDOSCOPY;  Service: Endoscopy;  Laterality: N/A;  . Colonoscopy N/A 02/16/2015    Procedure: COLONOSCOPY;   Surgeon: Wilford Corner, MD;  Location: [facility] ENDOSCOPY;  Service: Endoscopy;  Laterality: N/A;  . Ep implantable device N/A 02/23/2015    pacemaker generator change (MDT Sherril Croon SR) by Dr Rayann Heman     Social History   Social History  . Marital Status: Married    Spouse Name: N/A  . Number of Children: N/A  . Years of Education: N/A   Occupational History  . Not on file.   Social History Main Topics  . Smoking status: Never Smoker   . Smokeless tobacco: Never Used  . Alcohol Use: No  . Drug Use: No  . Sexual Activity: Not Currently   Other Topics Concern  . Not on file   Social History Narrative    Family History  Problem Relation Age of Onset  . Heart disease Maternal Grandmother   . Heart disease Mother     Allergies  Allergen Reactions  . Erythromycin Hives  . Tape     BAND AIDS-SKIN IRRITATION    Current Outpatient Prescriptions  Medication Sig Dispense Refill  . alendronate (FOSAMAX) 35 MG tablet TAKE 1 TABLET EVERY 7 DAYS WITH A FULL GLASS OF WATER ON AN EMPTY STOMACH 12 tablet 0  . amoxicillin-clavulanate (AUGMENTIN) 875-125 MG tablet Take 1 tablet by mouth 2 (two) times daily. 20 tablet 0  . aspirin 81 MG tablet Take 81 mg by mouth daily.    . Calcium Carbonate-Vitamin D (CALCIUM  600+D PO) Take 1 tablet by mouth daily.     . Cholecalciferol 2000 units TABS Take 1 tablet by mouth daily.    . clotrimazole (LOTRIMIN) 1 % cream Apply topically 2 (two) times daily. 30 g 0  . feeding supplement, GLUCERNA SHAKE, (GLUCERNA SHAKE) LIQD Take 237 mLs by mouth 3 (three) times daily between meals. 90 Can 0  . furosemide (LASIX) 20 MG tablet Take 20 mg by mouth daily.      Marland Kitchen letrozole (FEMARA) 2.5 MG tablet Take 1 tablet (2.5 mg total) by mouth daily. 90 tablet 3  . levothyroxine (SYNTHROID, LEVOTHROID) 50 MCG tablet Take 50 mcg by mouth daily.       No current facility-administered medications for this visit.    ROS- all systems are reviewed and negative except as per  HPI  Physical Exam: Filed Vitals:   08/25/15 1147  BP: 110/78  Pulse: 85  Height: 5\' 7"  (1.702 m)  Weight: 217 lb 12.8 oz (98.793 kg)    GEN- The patient is well appearing, alert and oriented x 3 today.   Head- normocephalic, atraumatic Eyes-  Sclera clear, conjunctiva pink Ears- hearing intact Oropharynx- clear Neck- supple  Lungs- Clear to ausculation bilaterally, normal work of breathing Chest- pacemaker pocket is well healed Heart- Regular rate and rhythm (paced) GI- soft, NT, ND, + BS Extremities- no clubbing, cyanosis, R>L chronic edema, multiple leg wounds of various stages and venous stasis changes Neuro- strength and sensation are intact  Pacemaker interrogation- reviewed in detail today,  See PACEART report  Assessment and Plan:  1. Complete heart block Normal pacemaker function See Pace Art report No changes today Compliance with remote monitoring encouraged  2. Edema She has evidence of venous insufficiency and multiple leg wounds.  I wonder if she has venous insufficiency which could be improved with venous ablation. I will refer to Dr Kellie Simmering for his expert opinion.  [facility] Follow-up with Dr Martinique as scheduled Return to see EP NP in 1 year   Thompson Grayer MD, [facility] 08/25/2015 12:09 PM

## 2015-08-25 NOTE — Patient Instructions (Signed)
Medication Instructions:  Your physician recommends that you continue on your current medications as directed. Please refer to the Current Medication list given to you today.   Labwork: None ordered   Testing/Procedures: You have been referred to Dr Kellie Simmering ----venous insuffiencey    Follow-Up: Your physician wants you to follow-up in: 12 months with Chanetta Marshall, NP You will receive a reminder letter in the mail two months in advance. If you don't receive a letter, please call our office to schedule the follow-up appointment.  Remote monitoring is used to monitor your Pacemaker  from home. This monitoring reduces the number of office visits required to check your device to one time per year. It allows [place] to keep an eye on the functioning of your device to ensure it is working properly. You are scheduled for a device check from home on 11/24/15. You may send your transmission at any time that day. If you have a wireless device, the transmission will be sent automatically. After your physician reviews your transmission, you will receive a postcard with your next transmission date.     Any Other Special Instructions Will Be Listed Below (If Applicable).     If you need a refill on your cardiac medications before your next appointment, please call your pharmacy.

## 2015-08-28 ENCOUNTER — Other Ambulatory Visit: Payer: Self-pay

## 2015-08-28 DIAGNOSIS — I83893 Varicose veins of bilateral lower extremities with other complications: Secondary | ICD-10-CM

## 2015-08-31 DIAGNOSIS — L98499 Non-pressure chronic ulcer of skin of other sites with unspecified severity: Secondary | ICD-10-CM | POA: Diagnosis not present

## 2015-08-31 DIAGNOSIS — E039 Hypothyroidism, unspecified: Secondary | ICD-10-CM | POA: Diagnosis not present

## 2015-08-31 DIAGNOSIS — I872 Venous insufficiency (chronic) (peripheral): Secondary | ICD-10-CM | POA: Diagnosis not present

## 2015-09-07 DIAGNOSIS — Z23 Encounter for immunization: Secondary | ICD-10-CM | POA: Diagnosis not present

## 2015-09-07 DIAGNOSIS — L98499 Non-pressure chronic ulcer of skin of other sites with unspecified severity: Secondary | ICD-10-CM | POA: Diagnosis not present

## 2015-09-14 DIAGNOSIS — L98499 Non-pressure chronic ulcer of skin of other sites with unspecified severity: Secondary | ICD-10-CM | POA: Diagnosis not present

## 2015-09-20 DIAGNOSIS — Z961 Presence of intraocular lens: Secondary | ICD-10-CM | POA: Diagnosis not present

## 2015-09-20 DIAGNOSIS — H401132 Primary open-angle glaucoma, bilateral, moderate stage: Secondary | ICD-10-CM | POA: Diagnosis not present

## 2015-09-20 DIAGNOSIS — H43811 Vitreous degeneration, right eye: Secondary | ICD-10-CM | POA: Diagnosis not present

## 2015-09-28 DIAGNOSIS — L98499 Non-pressure chronic ulcer of skin of other sites with unspecified severity: Secondary | ICD-10-CM | POA: Diagnosis not present

## 2015-10-04 DIAGNOSIS — H401132 Primary open-angle glaucoma, bilateral, moderate stage: Secondary | ICD-10-CM | POA: Diagnosis not present

## 2015-10-12 DIAGNOSIS — L98499 Non-pressure chronic ulcer of skin of other sites with unspecified severity: Secondary | ICD-10-CM | POA: Diagnosis not present

## 2015-10-18 ENCOUNTER — Encounter: Payer: Self-pay | Admitting: Vascular Surgery

## 2015-10-18 DIAGNOSIS — H401132 Primary open-angle glaucoma, bilateral, moderate stage: Secondary | ICD-10-CM | POA: Diagnosis not present

## 2015-10-24 ENCOUNTER — Other Ambulatory Visit: Payer: Self-pay | Admitting: Hematology and Oncology

## 2015-10-24 ENCOUNTER — Ambulatory Visit (INDEPENDENT_AMBULATORY_CARE_PROVIDER_SITE_OTHER): Payer: Medicare Other | Admitting: Vascular Surgery

## 2015-10-24 ENCOUNTER — Ambulatory Visit (HOSPITAL_COMMUNITY)
Admission: RE | Admit: 2015-10-24 | Discharge: 2015-10-24 | Disposition: A | Discharge status: Home / Self Care | Payer: Medicare Other | Source: Ambulatory Visit | Attending: Vascular Surgery | Admitting: Vascular Surgery

## 2015-10-24 ENCOUNTER — Encounter: Payer: Self-pay | Admitting: Vascular Surgery

## 2015-10-24 VITALS — BP 125/84 | HR 81 | Temp 97.4°F | Resp 16 | Ht 67.5 in | Wt 225.0 lb

## 2015-10-24 DIAGNOSIS — R600 Localized edema: Secondary | ICD-10-CM | POA: Diagnosis present

## 2015-10-24 DIAGNOSIS — E7800 Pure hypercholesterolemia, unspecified: Secondary | ICD-10-CM | POA: Insufficient documentation

## 2015-10-24 DIAGNOSIS — I442 Atrioventricular block, complete: Secondary | ICD-10-CM | POA: Insufficient documentation

## 2015-10-24 DIAGNOSIS — I83893 Varicose veins of bilateral lower extremities with other complications: Secondary | ICD-10-CM

## 2015-10-24 DIAGNOSIS — C50112 Malignant neoplasm of central portion of left female breast: Principal | ICD-10-CM

## 2015-10-24 DIAGNOSIS — C50111 Malignant neoplasm of central portion of right female breast: Secondary | ICD-10-CM

## 2015-10-24 NOTE — Progress Notes (Signed)
Subjective:     Patient ID: [name], female   DOB: [birth year], 82 y.o.   MRN: [id number]  HPI this 82 year old female was referred by Dr. Thompson Grayer for evaluation of bilateral venous insufficiency. Patient has a long history of venous ulcer lateral ankle on the right. She did go to the wound center for 6 months but decided not to return there since it had not improved she states. She has no history of DVT thrombophlebitis or bleeding. She also traumatized her left pretibial region recently and developed a sore in that area which was not spontaneous but caused by the trauma and it eventually healed. She has had some darkening of the skin on the left leg. She does have some mild chronic edema. She is borderline diabetic. She has also had a sore on the sole of her right foot. He is not currently receiving any specific wound treatment for the right lateral ankle ulcer. She does not elastic compression stockings.  Past Medical History  Diagnosis Date  . Invasive ductal carcinoma of breast ([place]) 03/2007    BILATERAL BREASTS  . Cataract   . Hypercholesterolemia   . Thyroid disease   . Complete heart block (HCC)     s/p PPM implant (MDT) by Dr Blanch Media.  Atrial lead could not be paced at time of the procedure.  She has chronic AV dysociation  . Orthostatic hypotension     treated with midodrine by Dr Rollene [name]  . Exogenous obesity   . Venous insufficiency   . PONV (postoperative nausea and vomiting)     Social History  Substance Use Topics  . Smoking status: Never Smoker   . Smokeless tobacco: Never Used  . Alcohol Use: No    Family History  Problem Relation Age of Onset  . Heart disease Maternal Grandmother   . Heart disease Mother     Allergies  Allergen Reactions  . Erythromycin Hives  . Tape     BAND AIDS-SKIN IRRITATION     Current outpatient prescriptions:  .  alendronate (FOSAMAX) 35 MG tablet, TAKE 1 TABLET EVERY 7 DAYS WITH A FULL GLASS OF WATER ON AN EMPTY  STOMACH, Disp: 12 tablet, Rfl: 3 .  amoxicillin-clavulanate (AUGMENTIN) 875-125 MG tablet, Take 1 tablet by mouth 2 (two) times daily., Disp: 20 tablet, Rfl: 0 .  aspirin 81 MG tablet, Take 81 mg by mouth daily., Disp: , Rfl:  .  Calcium Carbonate-Vitamin D (CALCIUM 600+D PO), Take 1 tablet by mouth daily. , Disp: , Rfl:  .  Cholecalciferol 2000 units TABS, Take 1 tablet by mouth daily., Disp: , Rfl:  .  clotrimazole (LOTRIMIN) 1 % cream, Apply topically 2 (two) times daily., Disp: 30 g, Rfl: 0 .  feeding supplement, GLUCERNA SHAKE, (GLUCERNA SHAKE) LIQD, Take 237 mLs by mouth 3 (three) times daily between meals., Disp: 90 Can, Rfl: 0 .  furosemide (LASIX) 20 MG tablet, Take 20 mg by mouth daily.  , Disp: , Rfl:  .  letrozole (FEMARA) 2.5 MG tablet, Take 1 tablet (2.5 mg total) by mouth daily., Disp: 90 tablet, Rfl: 3 .  levothyroxine (SYNTHROID, LEVOTHROID) 50 MCG tablet, Take 50 mcg by mouth daily.  , Disp: , Rfl:   Filed Vitals:   10/24/15 1016  BP: 125/84  Pulse: 81  Temp: 97.4 F (36.3 C)  Resp: 16  Height: 5' 7.5" (1.715 m)  Weight: 225 lb (102.059 kg)  SpO2: 96%    Body mass index is 34.7 kg/(m^2).  Review of Systems patient has history of complete heart block and has pacemaker placed by Dr. Rayann Heman. Had generator change last year. Also has history of hypercholesterolemia, bilateral breasts, cancer in 2008, borderline diabetes mellitus. Patient walks with the help of a walker or cane majority of the time because of instability. Has chronic obesity. Other systems negative and complete review of systems     Objective:   Physical Exam BP 125/84 mmHg  Pulse 81  Temp(Src) 97.4 F (36.3 C)  Resp 16  Ht 5' 7.5" (1.715 m)  Wt 225 lb (102.059 kg)  BMI 34.70 kg/m2  SpO2 96%    Gen.-alert and oriented x3 in no apparent distress-obese-appears frail HEENT normal for age Lungs no rhonchi or wheezing Cardiovascular regular rhythm no murmurs carotid pulses 3+  palpable no bruits audible-pacemaker generator palpable left in frock clavicular area  Abdomen soft nontender no palpable masses-obese Musculoskeletal free of  major deformities Skin clear -no rashes Neurologic normal Lower extremities 3+ femoral and dorsalis pedis pulses palpable bilaterally with 1+ edema bilaterally Mild hyperpigmentation left leg below the knee with no active ulceration noted Right leg with small ulcer over lateral malleolus measuring about 2 mm with no evidence of infection Neurotrophic ulcer plantar aspect right foot measuring about 1 cm in diameter-not infected  Today I ordered bilateral venous duplex exam which I reviewed and interpreted. There is no significant reflux in the right leg in the deep or superficial system. The left leg does have gross reflux in the great saphenous system and a borderline-sized vein.  Also performed a bedside independent sono site ultrasound exam. It does reveal that the left great saphenous vein has reflux proximally but it does course posteriorly in the proximal third of the thigh       Assessment:     Chronic venous ulcer lateral ankle area on the right with no evidence of deep or superficial venous reflux Neurotrophic ulcer plantar aspect right foot with excellent arterial circulation Recent history of traumatic left pretibial ulceration which has healed    Plan:     Would not recommend any procedures on this patient. Do not think she needs ablation of her left great saphenous vein which does have some reflux. She does have good arterial circulation I have recommended to her that she return to the wound center for the neurotrophic ulcer on this all of the right foot as well as the slowly healing ulcer on the lateral ankle area on the right that she is not interested in pursuing this We'll return to see [place] on a when necessary basis

## 2015-10-26 DIAGNOSIS — L98499 Non-pressure chronic ulcer of skin of other sites with unspecified severity: Secondary | ICD-10-CM | POA: Diagnosis not present

## 2015-11-02 DIAGNOSIS — E11621 Type 2 diabetes mellitus with foot ulcer: Secondary | ICD-10-CM | POA: Diagnosis not present

## 2015-11-02 DIAGNOSIS — E039 Hypothyroidism, unspecified: Secondary | ICD-10-CM | POA: Diagnosis not present

## 2015-11-02 DIAGNOSIS — L98499 Non-pressure chronic ulcer of skin of other sites with unspecified severity: Secondary | ICD-10-CM | POA: Diagnosis not present

## 2015-11-07 ENCOUNTER — Emergency Department (HOSPITAL_COMMUNITY)
Admission: EM | Admit: 2015-11-07 | Discharge: 2015-11-07 | Disposition: A | Discharge status: Home / Self Care | Payer: Medicare Other | Attending: Emergency Medicine | Admitting: Emergency Medicine

## 2015-11-07 ENCOUNTER — Encounter (HOSPITAL_COMMUNITY): Payer: Self-pay | Admitting: *Deleted

## 2015-11-07 ENCOUNTER — Emergency Department (HOSPITAL_BASED_OUTPATIENT_CLINIC_OR_DEPARTMENT_OTHER)
Admit: 2015-11-07 | Discharge: 2015-11-07 | Disposition: A | Discharge status: Home / Self Care | Payer: Medicare Other | Attending: Emergency Medicine | Admitting: Emergency Medicine

## 2015-11-07 DIAGNOSIS — R609 Edema, unspecified: Secondary | ICD-10-CM | POA: Diagnosis not present

## 2015-11-07 DIAGNOSIS — Z792 Long term (current) use of antibiotics: Secondary | ICD-10-CM | POA: Diagnosis not present

## 2015-11-07 DIAGNOSIS — I872 Venous insufficiency (chronic) (peripheral): Secondary | ICD-10-CM | POA: Insufficient documentation

## 2015-11-07 DIAGNOSIS — N289 Disorder of kidney and ureter, unspecified: Secondary | ICD-10-CM | POA: Diagnosis not present

## 2015-11-07 DIAGNOSIS — N189 Chronic kidney disease, unspecified: Secondary | ICD-10-CM | POA: Insufficient documentation

## 2015-11-07 DIAGNOSIS — I878 Other specified disorders of veins: Secondary | ICD-10-CM | POA: Diagnosis not present

## 2015-11-07 DIAGNOSIS — Z7982 Long term (current) use of aspirin: Secondary | ICD-10-CM | POA: Diagnosis not present

## 2015-11-07 DIAGNOSIS — M7989 Other specified soft tissue disorders: Secondary | ICD-10-CM | POA: Diagnosis not present

## 2015-11-07 DIAGNOSIS — I951 Orthostatic hypotension: Secondary | ICD-10-CM | POA: Diagnosis not present

## 2015-11-07 DIAGNOSIS — Z79899 Other long term (current) drug therapy: Secondary | ICD-10-CM | POA: Diagnosis not present

## 2015-11-07 DIAGNOSIS — F09 Unspecified mental disorder due to known physiological condition: Secondary | ICD-10-CM | POA: Diagnosis not present

## 2015-11-07 DIAGNOSIS — Z853 Personal history of malignant neoplasm of breast: Secondary | ICD-10-CM | POA: Insufficient documentation

## 2015-11-07 LAB — CBC WITH DIFFERENTIAL/PLATELET
BASOS ABS: 0 10*3/uL (ref 0.0–0.1)
BASOS PCT: 1 %
EOS PCT: 5 %
Eosinophils Absolute: 0.3 10*3/uL (ref 0.0–0.7)
HCT: 44 % (ref 36.0–46.0)
Hemoglobin: 14.6 g/dL (ref 12.0–15.0)
LYMPHS PCT: 13 %
Lymphs Abs: 0.8 10*3/uL (ref 0.7–4.0)
MCH: 30.6 pg (ref 26.0–34.0)
MCHC: 33.2 g/dL (ref 30.0–36.0)
MCV: 92.2 fL (ref 78.0–100.0)
MONO ABS: 0.7 10*3/uL (ref 0.1–1.0)
Monocytes Relative: 10 %
NEUTROS ABS: 4.7 10*3/uL (ref 1.7–7.7)
Neutrophils Relative %: 71 %
Platelets: 219 10*3/uL (ref 150–400)
RBC: 4.77 MIL/uL (ref 3.87–5.11)
RDW: 13.4 % (ref 11.5–15.5)
WBC: 6.6 10*3/uL (ref 4.0–10.5)

## 2015-11-07 LAB — COMPREHENSIVE METABOLIC PANEL
ALK PHOS: 96 U/L (ref 38–126)
ALT: 14 U/L (ref 14–54)
ANION GAP: 9 (ref 5–15)
AST: 22 U/L (ref 15–41)
Albumin: 3.6 g/dL (ref 3.5–5.0)
BILIRUBIN TOTAL: 1 mg/dL (ref 0.3–1.2)
BUN: 18 mg/dL (ref 6–20)
CALCIUM: 9.2 mg/dL (ref 8.9–10.3)
CO2: 24 mmol/L (ref 22–32)
Chloride: 108 mmol/L (ref 101–111)
Creatinine, Ser: 1.44 mg/dL — ABNORMAL HIGH (ref 0.44–1.00)
GFR calc Af Amer: 37 mL/min — ABNORMAL LOW (ref >60)
GFR, EST NON AFRICAN AMERICAN: 32 mL/min — AB (ref >60)
GLUCOSE: 92 mg/dL (ref 65–99)
POTASSIUM: 3.7 mmol/L (ref 3.5–5.1)
Sodium: 141 mmol/L (ref 135–145)
TOTAL PROTEIN: 6.8 g/dL (ref 6.5–8.1)

## 2015-11-07 LAB — URINALYSIS, ROUTINE W REFLEX MICROSCOPIC
Bilirubin Urine: NEGATIVE
Glucose, UA: NEGATIVE mg/dL
Hgb urine dipstick: NEGATIVE
KETONES UR: NEGATIVE mg/dL
LEUKOCYTES UA: NEGATIVE
NITRITE: NEGATIVE
PROTEIN: NEGATIVE mg/dL
Specific Gravity, Urine: 1.007 (ref 1.005–1.030)
pH: 6 (ref 5.0–8.0)

## 2015-11-07 MED ORDER — OXYCODONE-ACETAMINOPHEN 5-325 MG PO TABS: 1.0000 | ORAL_TABLET | Freq: Once | ORAL | Status: DC

## 2015-11-07 MED ORDER — CEPHALEXIN 500 MG PO CAPS: 500.0000 mg | ORAL_CAPSULE | Freq: Four times a day (QID) | ORAL | Status: DC

## 2015-11-07 NOTE — ED Provider Notes (Signed)
CSN: [id number]     Arrival date & time 11/07/15  0438 History   First MD Initiated Contact with Patient 11/07/15 0440     Chief Complaint  Patient presents with  . multiple complaints     (Consider location/radiation/quality/duration/timing/severity/associated sxs/prior Treatment) The history is provided by the patient.  82 year old female comes in complaining of fluid leaking from her legs, and her kidneys not working properly. She states that she was sitting at home when water just started draining from her legs. She is noted that their bed but they're not painful. She denies fever or chills. She denies any trauma. She states that her kidneys work sometimes and sometimes it on. Sometimes she urinates only a few drops and sometimes will pass a quarter cup. There is no urinary urgency, frequency, tenesmus, dysuria. She denies abdominal pain or flank pain.  Past Medical History  Diagnosis Date  . Invasive ductal carcinoma of breast ([place]) 03/2007    BILATERAL BREASTS  . Cataract   . Hypercholesterolemia   . Thyroid disease   . Complete heart block (HCC)     s/p PPM implant (MDT) by Dr Blanch Media.  Atrial lead could not be paced at time of the procedure.  She has chronic AV dysociation  . Orthostatic hypotension     treated with midodrine by Dr Rollene [name]  . Exogenous obesity   . Venous insufficiency   . PONV (postoperative nausea and vomiting)    Past Surgical History  Procedure Laterality Date  . Abdominal hysterectomy    . Cholecystectomy    . Cataract extraction      EYE SURGERY X 2  . Pacemaker insertion      MDT implanted by Dr Blanch Media.  Atrial lead placement was unsuccessful.  She has chronic AV dysociation  . Remote right radical nephrectomy  2009  . Breast lumpectomy Bilateral 2009  . Esophagogastroduodenoscopy N/A 02/16/2015    Procedure: ESOPHAGOGASTRODUODENOSCOPY (EGD);  Surgeon: Wilford Corner, MD;  Location[facility] ENDOSCOPY;  Service: Endoscopy;  Laterality: N/A;  .  Colonoscopy N/A 02/16/2015    Procedure: COLONOSCOPY;  Surgeon: Wilford Corner, MD;  Location: [facility] ENDOSCOPY;  Service: Endoscopy;  Laterality: N/A;  . Ep implantable device N/A 02/23/2015    pacemaker generator change (MDT [place]ia SR) by Dr Rayann Heman    Family History  Problem Relation Age of Onset  . Heart disease Maternal Grandmother   . Heart disease Mother    Social History  Substance Use Topics  . Smoking status: Never Smoker   . Smokeless tobacco: Never Used  . Alcohol Use: No   OB History    No data available     Review of Systems  All other systems reviewed and are negative.     Allergies  Erythromycin; Tape; Codeine; and Tolterodine  Home Medications   Prior to Admission medications   Medication Sig Start Date End Date Taking? Authorizing Provider  alendronate (FOSAMAX) 35 MG tablet TAKE 1 TABLET EVERY 7 DAYS WITH A FULL GLASS OF WATER ON AN EMPTY STOMACH 10/24/15  Yes [name], MD  aspirin 81 MG tablet Take 81 mg by mouth daily.   Yes Historical Provider, MD  furosemide (LASIX) 20 MG tablet Take 20 mg by mouth daily.     Yes Historical Provider, MD  letrozole (FEMARA) 2.5 MG tablet Take 1 tablet (2.5 mg total) by mouth daily. 12/26/14  Yes [name], MD  levothyroxine (SYNTHROID, LEVOTHROID) 50 MCG tablet Take 50 mcg by mouth daily.  Yes Historical Provider, MD  midodrine (PROAMATINE) 2.5 MG tablet Take 2.5 mg by mouth 2 (two) times daily with a meal.   Yes Historical Provider, MD  moxifloxacin (AVELOX) 400 MG tablet Take 400 mg by mouth daily at 8 pm.   Yes Historical Provider, MD  Probiotic Product (PROBIOTIC DAILY PO) Take 1 tablet by mouth daily.   Yes Historical Provider, MD  amoxicillin-clavulanate (AUGMENTIN) 875-125 MG tablet Take 1 tablet by mouth 2 (two) times daily. Patient not taking: Reported on 11/07/2015 08/14/15   [name], MD  clotrimazole (LOTRIMIN) 1 % cream Apply topically 2 (two) times daily. Patient not taking: Reported on 11/07/2015  08/14/15   [name], MD  feeding supplement, GLUCERNA SHAKE, (GLUCERNA SHAKE) LIQD Take 237 mLs by mouth 3 (three) times daily between meals. Patient not taking: Reported on 11/07/2015 08/14/15   [name], MD   BP 101/73 mmHg  Pulse 71  Temp(Src) 98.5 F (36.9 C) (Oral)  Resp 16  SpO2 94% Physical Exam  Nursing note and vitals reviewed.  82 year old female, resting comfortably and in no acute distress. Vital signs are normal. Oxygen saturation is 94%, which is normal. Head is normocephalic and atraumatic. PERRLA, EOMI. Oropharynx is clear. Neck is nontender and supple without adenopathy or JVD. Back is nontender and there is no CVA tenderness. Lungs are clear without rales, wheezes, or rhonchi. Chest is nontender. Heart has regular rate and rhythm without murmur. Abdomen is soft, flat, nontender without masses or hepatosplenomegaly and peristalsis is normoactive. Extremities: Venous stasis changes are present which are worse on the left. Left calf circumferences 2 cm greater than right calf circumference. There is trace edema on the right and 2+ edema on the left. There is erythema of the lower leg which is more prominent on the left side with slight warmth. Left calf has a superficial laceration which did appear to be draining some serous fluid. Skin is warm and dry without other rash. Neurologic: She is awake and alert and conversant, cranial nerves are intact, there are no motor or sensory deficits.  ED Course  Procedures (including critical care time) Labs Review Results for orders placed or performed during the hospital encounter of 11/07/15  Comprehensive metabolic panel  Result Value Ref Range   Sodium 141 135 - 145 mmol/L   Potassium 3.7 3.5 - 5.1 mmol/L   Chloride 108 101 - 111 mmol/L   CO2 24 22 - 32 mmol/L   Glucose, Bld 92 65 - 99 mg/dL   BUN 18 6 - 20 mg/dL   Creatinine, Ser 1.44 (H) 0.44 - 1.00 mg/dL   Calcium 9.2 8.9 - 10.3 mg/dL   Total Protein 6.8 6.5 - 8.1 g/dL    Albumin 3.6 3.5 - 5.0 g/dL   AST 22 15 - 41 U/L   ALT 14 14 - 54 U/L   Alkaline Phosphatase 96 38 - 126 U/L   Total Bilirubin 1.0 0.3 - 1.2 mg/dL   GFR calc non Af Amer 32 (L) >60 mL/min   GFR calc Af Amer 37 (L) >60 mL/min   Anion gap 9 5 - 15  CBC with Differential  Result Value Ref Range   WBC 6.6 4.0 - 10.5 K/uL   RBC 4.77 3.87 - 5.11 MIL/uL   Hemoglobin 14.6 12.0 - 15.0 g/dL   HCT 44.0 36.0 - 46.0 %   MCV 92.2 78.0 - 100.0 fL   MCH 30.6 26.0 - 34.0 pg   MCHC 33.2 30.0 -  36.0 g/dL   RDW 13.4 11.5 - 15.5 %   Platelets 219 150 - 400 K/uL   Neutrophils Relative % 71 %   Neutro Abs 4.7 1.7 - 7.7 K/uL   Lymphocytes Relative 13 %   Lymphs Abs 0.8 0.7 - 4.0 K/uL   Monocytes Relative 10 %   Monocytes Absolute 0.7 0.1 - 1.0 K/uL   Eosinophils Relative 5 %   Eosinophils Absolute 0.3 0.0 - 0.7 K/uL   Basophils Relative 1 %   Basophils Absolute 0.0 0.0 - 0.1 K/uL  Urinalysis, Routine w reflex microscopic  Result Value Ref Range   Color, Urine YELLOW YELLOW   APPearance CLEAR CLEAR   Specific Gravity, Urine 1.007 1.005 - 1.030   pH 6.0 5.0 - 8.0   Glucose, UA NEGATIVE NEGATIVE mg/dL   Hgb urine dipstick NEGATIVE NEGATIVE   Bilirubin Urine NEGATIVE NEGATIVE   Ketones, ur NEGATIVE NEGATIVE mg/dL   Protein, ur NEGATIVE NEGATIVE mg/dL   Nitrite NEGATIVE NEGATIVE   Leukocytes, UA NEGATIVE NEGATIVE   I have personally reviewed and evaluated these lab results as part of my medical decision-making.   MDM   Final diagnoses:  Venous stasis dermatitis of both lower extremities  Renal insufficiency    Urinary complaints will be evaluated by checking BUN, creatinine, urinalysis. Lower extremity edema which seems to be primarily have venous stasis. I'm concerned about the asymmetry, and she'll be sent for venous Doppler to rule out DVT. There is probably some low-grade infection and she may benefit from a course of antibiotics. Nursing note reviewed and there is report that she was  disoriented when EMS evaluated her. However, her complaints of been fairly consistent and do seem to be well grounded. On review of old records, she does have a prior ED visit for psychosis with paranoia.  Laboratory workup is unremarkable. WBC is normal. Mild renal insufficiency is present and not changed from baseline. Urinalysis is normal. Venous Doppler is pending. Case is signed out to Dr. Ralene Bathe. If Doppler study is negative, will treat for probable low-grade cellulitis.  Delora Fuel, MD [id number]

## 2015-11-07 NOTE — ED Provider Notes (Signed)
DVT study negative. Discussed with patient findings of studies. Will start Keflex for possible mild cellulitis. She has appointment tomorrow with her PCP. Discussed home care, close outpatient follow-up, return precautions.  Quintella Reichert, MD 11/07/15 [phone]
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
## 2015-11-07 NOTE — Progress Notes (Signed)
VASCULAR LAB PRELIMINARY  PRELIMINARY  PRELIMINARY  PRELIMINARY  Left lower extremity venous duplex has been completed.     Left:  No evidence of DVT, superficial thrombosis, or Baker's cyst.  Gave result to patient's nurse.  Caydan Mctavish, RVT, RDMS 11/07/2015, 8:50 AM

## 2015-11-07 NOTE — Discharge Instructions (Signed)
Stasis Dermatitis Stasis dermatitis occurs when veins lose the ability to pump blood back to the heart (poor venous circulation). It causes a reddish-purple to brownish scaly, itchy rash on the legs. The rash comes from pooling of blood (stasis). CAUSES  This occurs because the veins do not work very well anymore or because pressure may be increased in the veins due to other conditions. With blood pooling, the increased pressure in the tiny blood vessels (capillaries) causes fluid to leak out of the capillaries into the tissue. The extra fluid makes it harder for the blood to feed the cells and get rid of waste products. SYMPTOMS  Stasis dermatitis appears as red, scaly, itchy patches on the legs. A yellowish or light brown discoloration is also present. Due to scratching or other injury, these patches can become an ulcer. This ulcer may remain for long periods of time. The ulcer can also become infected. Swelling of the legs is often present with stasis dermatitis. If the leg is swollen, this increases the risk of infection and further damage to the skin. Sometimes, intense itching, tingling, and burning occurs before signs of stasis dermatitis appear. You may find yourself scratching the insides of your ankles or rubbing your ankles together before the rash appears. After healing, there are often brown spots on the affected skin. DIAGNOSIS  Your caregiver makes this diagnosis based on an exam. Other tests may be done to better understand the cause. TREATMENT  If underlying conditions are present, they must be treated. Some of these conditions are heart failure, thyroid problems, poor nutrition, and varicose veins.  Cortisone creams and ointments applied to the skin (topically) may be needed, as well as medicine to reduce swelling in the legs (diuretics).  Compression stockings or an elastic wrap may also be needed to reduce swelling.  If there is an infection, antibiotic medicines may also be  used. HOME CARE INSTRUCTIONS   Try to rest and raise (elevate) the affected leg above the level of the heart, if possible.  Burow's solution wet packs applied for 30 minutes, 3 times daily, will help the weepy rash. Stop using the packs before your skin gets too dry. You can also use a mixture of 3 parts white vinegar to 1 quart water.  Grease your legs daily with ointments, such as petroleum jelly, to fight dryness.  Avoid scratching or injuring the area. SEEK IMMEDIATE MEDICAL CARE IF:   Your rash gets worse.  An ulcer forms.  You have an oral temperature above 102 F (38.9 C), not controlled by medicine.  You have any other severe symptoms.   This information is not intended to replace advice given to you by your health care provider. Make sure you discuss any questions you have with your health care provider.   Document Released: 07/25/2005 Document Revised: 07/08/2011 Document Reviewed: 08/31/2014 Elsevier Interactive Patient Education [employer].

## 2015-11-07 NOTE — ED Notes (Signed)
Bed: WA24 Expected date:  Expected time:  Means of arrival:  Comments: EMS 

## 2015-11-17 DIAGNOSIS — R609 Edema, unspecified: Secondary | ICD-10-CM | POA: Diagnosis not present

## 2015-11-23 DIAGNOSIS — L97512 Non-pressure chronic ulcer of other part of right foot with fat layer exposed: Secondary | ICD-10-CM | POA: Diagnosis not present

## 2015-11-23 DIAGNOSIS — L089 Local infection of the skin and subcutaneous tissue, unspecified: Secondary | ICD-10-CM | POA: Diagnosis not present

## 2015-11-23 DIAGNOSIS — I872 Venous insufficiency (chronic) (peripheral): Secondary | ICD-10-CM | POA: Diagnosis not present

## 2015-11-23 DIAGNOSIS — I890 Lymphedema, not elsewhere classified: Secondary | ICD-10-CM | POA: Diagnosis not present

## 2015-11-23 DIAGNOSIS — L97212 Non-pressure chronic ulcer of right calf with fat layer exposed: Secondary | ICD-10-CM | POA: Diagnosis not present

## 2015-11-24 ENCOUNTER — Telehealth: Payer: Self-pay | Admitting: Cardiology

## 2015-11-24 ENCOUNTER — Encounter: Payer: Medicare Other | Admitting: *Deleted

## 2015-11-24 NOTE — Telephone Encounter (Signed)
Attempted to confirm remote transmission with pt. No answer and was unable to leave a message.   

## 2015-11-25 DIAGNOSIS — F22 Delusional disorders: Secondary | ICD-10-CM | POA: Diagnosis not present

## 2015-11-25 DIAGNOSIS — I872 Venous insufficiency (chronic) (peripheral): Secondary | ICD-10-CM | POA: Diagnosis not present

## 2015-11-25 DIAGNOSIS — I890 Lymphedema, not elsewhere classified: Secondary | ICD-10-CM | POA: Diagnosis not present

## 2015-11-25 DIAGNOSIS — L97219 Non-pressure chronic ulcer of right calf with unspecified severity: Secondary | ICD-10-CM | POA: Diagnosis not present

## 2015-11-25 DIAGNOSIS — N189 Chronic kidney disease, unspecified: Secondary | ICD-10-CM | POA: Diagnosis not present

## 2015-11-25 DIAGNOSIS — L97512 Non-pressure chronic ulcer of other part of right foot with fat layer exposed: Secondary | ICD-10-CM | POA: Diagnosis not present

## 2015-11-27 ENCOUNTER — Encounter: Payer: Self-pay | Admitting: Cardiology

## 2015-11-29 DIAGNOSIS — Z961 Presence of intraocular lens: Secondary | ICD-10-CM | POA: Diagnosis not present

## 2015-11-29 DIAGNOSIS — H401132 Primary open-angle glaucoma, bilateral, moderate stage: Secondary | ICD-10-CM | POA: Diagnosis not present

## 2015-11-29 DIAGNOSIS — H43811 Vitreous degeneration, right eye: Secondary | ICD-10-CM | POA: Diagnosis not present

## 2015-11-29 DIAGNOSIS — H40013 Open angle with borderline findings, low risk, bilateral: Secondary | ICD-10-CM | POA: Diagnosis not present

## 2015-11-30 DIAGNOSIS — L97219 Non-pressure chronic ulcer of right calf with unspecified severity: Secondary | ICD-10-CM | POA: Diagnosis not present

## 2015-11-30 DIAGNOSIS — L97512 Non-pressure chronic ulcer of other part of right foot with fat layer exposed: Secondary | ICD-10-CM | POA: Diagnosis not present

## 2015-11-30 DIAGNOSIS — I890 Lymphedema, not elsewhere classified: Secondary | ICD-10-CM | POA: Diagnosis not present

## 2015-11-30 DIAGNOSIS — I872 Venous insufficiency (chronic) (peripheral): Secondary | ICD-10-CM | POA: Diagnosis not present

## 2015-11-30 DIAGNOSIS — F22 Delusional disorders: Secondary | ICD-10-CM | POA: Diagnosis not present

## 2015-11-30 DIAGNOSIS — N189 Chronic kidney disease, unspecified: Secondary | ICD-10-CM | POA: Diagnosis not present

## 2015-12-01 ENCOUNTER — Ambulatory Visit
Admission: RE | Admit: 2015-12-01 | Discharge: 2015-12-01 | Disposition: A | Discharge status: Home / Self Care | Payer: Medicare Other | Source: Ambulatory Visit | Attending: Internal Medicine | Admitting: Internal Medicine

## 2015-12-01 ENCOUNTER — Other Ambulatory Visit: Payer: Self-pay | Admitting: Internal Medicine

## 2015-12-01 DIAGNOSIS — N189 Chronic kidney disease, unspecified: Secondary | ICD-10-CM | POA: Diagnosis not present

## 2015-12-01 DIAGNOSIS — I872 Venous insufficiency (chronic) (peripheral): Secondary | ICD-10-CM | POA: Diagnosis not present

## 2015-12-01 DIAGNOSIS — L97219 Non-pressure chronic ulcer of right calf with unspecified severity: Secondary | ICD-10-CM | POA: Diagnosis not present

## 2015-12-01 DIAGNOSIS — M19071 Primary osteoarthritis, right ankle and foot: Secondary | ICD-10-CM | POA: Diagnosis not present

## 2015-12-01 DIAGNOSIS — F22 Delusional disorders: Secondary | ICD-10-CM | POA: Diagnosis not present

## 2015-12-01 DIAGNOSIS — I890 Lymphedema, not elsewhere classified: Secondary | ICD-10-CM | POA: Diagnosis not present

## 2015-12-01 DIAGNOSIS — L97512 Non-pressure chronic ulcer of other part of right foot with fat layer exposed: Secondary | ICD-10-CM | POA: Diagnosis not present

## 2015-12-01 DIAGNOSIS — L97519 Non-pressure chronic ulcer of other part of right foot with unspecified severity: Secondary | ICD-10-CM

## 2015-12-04 DIAGNOSIS — I890 Lymphedema, not elsewhere classified: Secondary | ICD-10-CM | POA: Diagnosis not present

## 2015-12-04 DIAGNOSIS — F22 Delusional disorders: Secondary | ICD-10-CM | POA: Diagnosis not present

## 2015-12-04 DIAGNOSIS — I872 Venous insufficiency (chronic) (peripheral): Secondary | ICD-10-CM | POA: Diagnosis not present

## 2015-12-04 DIAGNOSIS — N189 Chronic kidney disease, unspecified: Secondary | ICD-10-CM | POA: Diagnosis not present

## 2015-12-04 DIAGNOSIS — L97219 Non-pressure chronic ulcer of right calf with unspecified severity: Secondary | ICD-10-CM | POA: Diagnosis not present

## 2015-12-04 DIAGNOSIS — L97512 Non-pressure chronic ulcer of other part of right foot with fat layer exposed: Secondary | ICD-10-CM | POA: Diagnosis not present

## 2015-12-06 DIAGNOSIS — I890 Lymphedema, not elsewhere classified: Secondary | ICD-10-CM | POA: Diagnosis not present

## 2015-12-06 DIAGNOSIS — I872 Venous insufficiency (chronic) (peripheral): Secondary | ICD-10-CM | POA: Diagnosis not present

## 2015-12-06 DIAGNOSIS — L97512 Non-pressure chronic ulcer of other part of right foot with fat layer exposed: Secondary | ICD-10-CM | POA: Diagnosis not present

## 2015-12-06 DIAGNOSIS — L97212 Non-pressure chronic ulcer of right calf with fat layer exposed: Secondary | ICD-10-CM | POA: Diagnosis not present

## 2015-12-06 DIAGNOSIS — Z7982 Long term (current) use of aspirin: Secondary | ICD-10-CM | POA: Diagnosis not present

## 2015-12-11 DIAGNOSIS — F22 Delusional disorders: Secondary | ICD-10-CM | POA: Diagnosis not present

## 2015-12-11 DIAGNOSIS — L97219 Non-pressure chronic ulcer of right calf with unspecified severity: Secondary | ICD-10-CM | POA: Diagnosis not present

## 2015-12-11 DIAGNOSIS — I872 Venous insufficiency (chronic) (peripheral): Secondary | ICD-10-CM | POA: Diagnosis not present

## 2015-12-11 DIAGNOSIS — I890 Lymphedema, not elsewhere classified: Secondary | ICD-10-CM | POA: Diagnosis not present

## 2015-12-11 DIAGNOSIS — N189 Chronic kidney disease, unspecified: Secondary | ICD-10-CM | POA: Diagnosis not present

## 2015-12-11 DIAGNOSIS — L97512 Non-pressure chronic ulcer of other part of right foot with fat layer exposed: Secondary | ICD-10-CM | POA: Diagnosis not present

## 2015-12-13 DIAGNOSIS — L97212 Non-pressure chronic ulcer of right calf with fat layer exposed: Secondary | ICD-10-CM | POA: Diagnosis not present

## 2015-12-13 DIAGNOSIS — F22 Delusional disorders: Secondary | ICD-10-CM | POA: Diagnosis not present

## 2015-12-13 DIAGNOSIS — N189 Chronic kidney disease, unspecified: Secondary | ICD-10-CM | POA: Diagnosis not present

## 2015-12-13 DIAGNOSIS — L97219 Non-pressure chronic ulcer of right calf with unspecified severity: Secondary | ICD-10-CM | POA: Diagnosis not present

## 2015-12-13 DIAGNOSIS — I890 Lymphedema, not elsewhere classified: Secondary | ICD-10-CM | POA: Diagnosis not present

## 2015-12-13 DIAGNOSIS — I872 Venous insufficiency (chronic) (peripheral): Secondary | ICD-10-CM | POA: Diagnosis not present

## 2015-12-13 DIAGNOSIS — L97512 Non-pressure chronic ulcer of other part of right foot with fat layer exposed: Secondary | ICD-10-CM | POA: Diagnosis not present

## 2015-12-15 ENCOUNTER — Ambulatory Visit (INDEPENDENT_AMBULATORY_CARE_PROVIDER_SITE_OTHER): Payer: Medicare Other | Admitting: *Deleted

## 2015-12-15 DIAGNOSIS — F22 Delusional disorders: Secondary | ICD-10-CM | POA: Diagnosis not present

## 2015-12-15 DIAGNOSIS — L97512 Non-pressure chronic ulcer of other part of right foot with fat layer exposed: Secondary | ICD-10-CM | POA: Diagnosis not present

## 2015-12-15 DIAGNOSIS — I442 Atrioventricular block, complete: Secondary | ICD-10-CM

## 2015-12-15 DIAGNOSIS — I872 Venous insufficiency (chronic) (peripheral): Secondary | ICD-10-CM | POA: Diagnosis not present

## 2015-12-15 DIAGNOSIS — N189 Chronic kidney disease, unspecified: Secondary | ICD-10-CM | POA: Diagnosis not present

## 2015-12-15 DIAGNOSIS — L97219 Non-pressure chronic ulcer of right calf with unspecified severity: Secondary | ICD-10-CM | POA: Diagnosis not present

## 2015-12-15 DIAGNOSIS — I890 Lymphedema, not elsewhere classified: Secondary | ICD-10-CM | POA: Diagnosis not present

## 2015-12-15 DIAGNOSIS — Z950 Presence of cardiac pacemaker: Secondary | ICD-10-CM

## 2015-12-18 DIAGNOSIS — I872 Venous insufficiency (chronic) (peripheral): Secondary | ICD-10-CM | POA: Diagnosis not present

## 2015-12-18 DIAGNOSIS — N189 Chronic kidney disease, unspecified: Secondary | ICD-10-CM | POA: Diagnosis not present

## 2015-12-18 DIAGNOSIS — L97219 Non-pressure chronic ulcer of right calf with unspecified severity: Secondary | ICD-10-CM | POA: Diagnosis not present

## 2015-12-18 DIAGNOSIS — I890 Lymphedema, not elsewhere classified: Secondary | ICD-10-CM | POA: Diagnosis not present

## 2015-12-18 DIAGNOSIS — F22 Delusional disorders: Secondary | ICD-10-CM | POA: Diagnosis not present

## 2015-12-18 DIAGNOSIS — L97512 Non-pressure chronic ulcer of other part of right foot with fat layer exposed: Secondary | ICD-10-CM | POA: Diagnosis not present

## 2015-12-19 ENCOUNTER — Encounter: Payer: Self-pay | Admitting: Cardiology

## 2015-12-19 DIAGNOSIS — I872 Venous insufficiency (chronic) (peripheral): Secondary | ICD-10-CM | POA: Diagnosis not present

## 2015-12-19 DIAGNOSIS — L97312 Non-pressure chronic ulcer of right ankle with fat layer exposed: Secondary | ICD-10-CM | POA: Diagnosis not present

## 2015-12-19 DIAGNOSIS — L97212 Non-pressure chronic ulcer of right calf with fat layer exposed: Secondary | ICD-10-CM | POA: Diagnosis not present

## 2015-12-19 DIAGNOSIS — Z7982 Long term (current) use of aspirin: Secondary | ICD-10-CM | POA: Diagnosis not present

## 2015-12-19 DIAGNOSIS — L97512 Non-pressure chronic ulcer of other part of right foot with fat layer exposed: Secondary | ICD-10-CM | POA: Diagnosis not present

## 2015-12-19 DIAGNOSIS — I890 Lymphedema, not elsewhere classified: Secondary | ICD-10-CM | POA: Diagnosis not present

## 2015-12-19 NOTE — Progress Notes (Signed)
Remote pacemaker transmission.   

## 2015-12-25 ENCOUNTER — Other Ambulatory Visit: Payer: Self-pay

## 2015-12-25 DIAGNOSIS — N189 Chronic kidney disease, unspecified: Secondary | ICD-10-CM | POA: Diagnosis not present

## 2015-12-25 DIAGNOSIS — I872 Venous insufficiency (chronic) (peripheral): Secondary | ICD-10-CM | POA: Diagnosis not present

## 2015-12-25 DIAGNOSIS — F22 Delusional disorders: Secondary | ICD-10-CM | POA: Diagnosis not present

## 2015-12-25 DIAGNOSIS — L97512 Non-pressure chronic ulcer of other part of right foot with fat layer exposed: Secondary | ICD-10-CM | POA: Diagnosis not present

## 2015-12-25 DIAGNOSIS — L97219 Non-pressure chronic ulcer of right calf with unspecified severity: Secondary | ICD-10-CM | POA: Diagnosis not present

## 2015-12-25 DIAGNOSIS — I890 Lymphedema, not elsewhere classified: Secondary | ICD-10-CM | POA: Diagnosis not present

## 2015-12-26 DIAGNOSIS — L821 Other seborrheic keratosis: Secondary | ICD-10-CM | POA: Diagnosis not present

## 2015-12-26 DIAGNOSIS — C44319 Basal cell carcinoma of skin of other parts of face: Secondary | ICD-10-CM | POA: Diagnosis not present

## 2015-12-26 DIAGNOSIS — L708 Other acne: Secondary | ICD-10-CM | POA: Diagnosis not present

## 2015-12-26 DIAGNOSIS — L570 Actinic keratosis: Secondary | ICD-10-CM | POA: Diagnosis not present

## 2015-12-27 DIAGNOSIS — L97212 Non-pressure chronic ulcer of right calf with fat layer exposed: Secondary | ICD-10-CM | POA: Diagnosis not present

## 2015-12-27 DIAGNOSIS — Z7982 Long term (current) use of aspirin: Secondary | ICD-10-CM | POA: Diagnosis not present

## 2015-12-27 DIAGNOSIS — L97512 Non-pressure chronic ulcer of other part of right foot with fat layer exposed: Secondary | ICD-10-CM | POA: Diagnosis not present

## 2015-12-27 DIAGNOSIS — F22 Delusional disorders: Secondary | ICD-10-CM | POA: Diagnosis not present

## 2015-12-27 DIAGNOSIS — N189 Chronic kidney disease, unspecified: Secondary | ICD-10-CM | POA: Diagnosis not present

## 2015-12-27 DIAGNOSIS — I872 Venous insufficiency (chronic) (peripheral): Secondary | ICD-10-CM | POA: Diagnosis not present

## 2015-12-27 DIAGNOSIS — L97219 Non-pressure chronic ulcer of right calf with unspecified severity: Secondary | ICD-10-CM | POA: Diagnosis not present

## 2015-12-27 DIAGNOSIS — I890 Lymphedema, not elsewhere classified: Secondary | ICD-10-CM | POA: Diagnosis not present

## 2015-12-29 DIAGNOSIS — I890 Lymphedema, not elsewhere classified: Secondary | ICD-10-CM | POA: Diagnosis not present

## 2015-12-29 DIAGNOSIS — N189 Chronic kidney disease, unspecified: Secondary | ICD-10-CM | POA: Diagnosis not present

## 2015-12-29 DIAGNOSIS — L97219 Non-pressure chronic ulcer of right calf with unspecified severity: Secondary | ICD-10-CM | POA: Diagnosis not present

## 2015-12-29 DIAGNOSIS — F22 Delusional disorders: Secondary | ICD-10-CM | POA: Diagnosis not present

## 2015-12-29 DIAGNOSIS — I872 Venous insufficiency (chronic) (peripheral): Secondary | ICD-10-CM | POA: Diagnosis not present

## 2015-12-29 DIAGNOSIS — L97512 Non-pressure chronic ulcer of other part of right foot with fat layer exposed: Secondary | ICD-10-CM | POA: Diagnosis not present

## 2015-12-31 DIAGNOSIS — F22 Delusional disorders: Secondary | ICD-10-CM | POA: Diagnosis not present

## 2015-12-31 DIAGNOSIS — I872 Venous insufficiency (chronic) (peripheral): Secondary | ICD-10-CM | POA: Diagnosis not present

## 2015-12-31 DIAGNOSIS — I890 Lymphedema, not elsewhere classified: Secondary | ICD-10-CM | POA: Diagnosis not present

## 2015-12-31 DIAGNOSIS — N189 Chronic kidney disease, unspecified: Secondary | ICD-10-CM | POA: Diagnosis not present

## 2015-12-31 DIAGNOSIS — L97219 Non-pressure chronic ulcer of right calf with unspecified severity: Secondary | ICD-10-CM | POA: Diagnosis not present

## 2015-12-31 DIAGNOSIS — L97512 Non-pressure chronic ulcer of other part of right foot with fat layer exposed: Secondary | ICD-10-CM | POA: Diagnosis not present

## 2016-01-02 LAB — CUP PACEART REMOTE DEVICE CHECK
Battery Remaining Longevity: 105 mo
Date Time Interrogation Session: 20170818124556
Implantable Lead Implant Date: 20141208
Implantable Lead Model: 4076
Lead Channel Impedance Value: 374 Ohm
Lead Channel Pacing Threshold Amplitude: 1.125 V
Lead Channel Setting Sensing Sensitivity: 2.8 mV
MDC IDC LEAD LOCATION: 753860
MDC IDC MSMT BATTERY IMPEDANCE: 183 Ohm
MDC IDC MSMT BATTERY VOLTAGE: 2.78 V
MDC IDC MSMT LEADCHNL RA IMPEDANCE VALUE: 0 Ohm
MDC IDC MSMT LEADCHNL RV PACING THRESHOLD PULSEWIDTH: 0.4 ms
MDC IDC SET LEADCHNL RV PACING AMPLITUDE: 2.25 V
MDC IDC SET LEADCHNL RV PACING PULSEWIDTH: 0.4 ms
MDC IDC STAT BRADY RV PERCENT PACED: 100 %

## 2016-01-03 DIAGNOSIS — L97512 Non-pressure chronic ulcer of other part of right foot with fat layer exposed: Secondary | ICD-10-CM | POA: Diagnosis not present

## 2016-01-04 DIAGNOSIS — E039 Hypothyroidism, unspecified: Secondary | ICD-10-CM | POA: Diagnosis not present

## 2016-01-04 DIAGNOSIS — R197 Diarrhea, unspecified: Secondary | ICD-10-CM | POA: Diagnosis not present

## 2016-01-05 DIAGNOSIS — I890 Lymphedema, not elsewhere classified: Secondary | ICD-10-CM | POA: Diagnosis not present

## 2016-01-05 DIAGNOSIS — L97219 Non-pressure chronic ulcer of right calf with unspecified severity: Secondary | ICD-10-CM | POA: Diagnosis not present

## 2016-01-05 DIAGNOSIS — F22 Delusional disorders: Secondary | ICD-10-CM | POA: Diagnosis not present

## 2016-01-05 DIAGNOSIS — N189 Chronic kidney disease, unspecified: Secondary | ICD-10-CM | POA: Diagnosis not present

## 2016-01-05 DIAGNOSIS — L97512 Non-pressure chronic ulcer of other part of right foot with fat layer exposed: Secondary | ICD-10-CM | POA: Diagnosis not present

## 2016-01-05 DIAGNOSIS — I872 Venous insufficiency (chronic) (peripheral): Secondary | ICD-10-CM | POA: Diagnosis not present

## 2016-01-08 DIAGNOSIS — L97512 Non-pressure chronic ulcer of other part of right foot with fat layer exposed: Secondary | ICD-10-CM | POA: Diagnosis not present

## 2016-01-08 DIAGNOSIS — F22 Delusional disorders: Secondary | ICD-10-CM | POA: Diagnosis not present

## 2016-01-08 DIAGNOSIS — I890 Lymphedema, not elsewhere classified: Secondary | ICD-10-CM | POA: Diagnosis not present

## 2016-01-08 DIAGNOSIS — L97219 Non-pressure chronic ulcer of right calf with unspecified severity: Secondary | ICD-10-CM | POA: Diagnosis not present

## 2016-01-08 DIAGNOSIS — I872 Venous insufficiency (chronic) (peripheral): Secondary | ICD-10-CM | POA: Diagnosis not present

## 2016-01-08 DIAGNOSIS — N189 Chronic kidney disease, unspecified: Secondary | ICD-10-CM | POA: Diagnosis not present

## 2016-01-10 DIAGNOSIS — I890 Lymphedema, not elsewhere classified: Secondary | ICD-10-CM | POA: Diagnosis not present

## 2016-01-10 DIAGNOSIS — L97212 Non-pressure chronic ulcer of right calf with fat layer exposed: Secondary | ICD-10-CM | POA: Diagnosis not present

## 2016-01-10 DIAGNOSIS — F22 Delusional disorders: Secondary | ICD-10-CM | POA: Diagnosis not present

## 2016-01-10 DIAGNOSIS — Z7982 Long term (current) use of aspirin: Secondary | ICD-10-CM | POA: Diagnosis not present

## 2016-01-10 DIAGNOSIS — L97512 Non-pressure chronic ulcer of other part of right foot with fat layer exposed: Secondary | ICD-10-CM | POA: Diagnosis not present

## 2016-01-10 DIAGNOSIS — I872 Venous insufficiency (chronic) (peripheral): Secondary | ICD-10-CM | POA: Diagnosis not present

## 2016-01-12 DIAGNOSIS — N189 Chronic kidney disease, unspecified: Secondary | ICD-10-CM | POA: Diagnosis not present

## 2016-01-12 DIAGNOSIS — L97219 Non-pressure chronic ulcer of right calf with unspecified severity: Secondary | ICD-10-CM | POA: Diagnosis not present

## 2016-01-12 DIAGNOSIS — F22 Delusional disorders: Secondary | ICD-10-CM | POA: Diagnosis not present

## 2016-01-12 DIAGNOSIS — I890 Lymphedema, not elsewhere classified: Secondary | ICD-10-CM | POA: Diagnosis not present

## 2016-01-12 DIAGNOSIS — I872 Venous insufficiency (chronic) (peripheral): Secondary | ICD-10-CM | POA: Diagnosis not present

## 2016-01-12 DIAGNOSIS — L97512 Non-pressure chronic ulcer of other part of right foot with fat layer exposed: Secondary | ICD-10-CM | POA: Diagnosis not present

## 2016-01-15 DIAGNOSIS — L97512 Non-pressure chronic ulcer of other part of right foot with fat layer exposed: Secondary | ICD-10-CM | POA: Diagnosis not present

## 2016-01-15 DIAGNOSIS — L97219 Non-pressure chronic ulcer of right calf with unspecified severity: Secondary | ICD-10-CM | POA: Diagnosis not present

## 2016-01-15 DIAGNOSIS — I890 Lymphedema, not elsewhere classified: Secondary | ICD-10-CM | POA: Diagnosis not present

## 2016-01-15 DIAGNOSIS — I872 Venous insufficiency (chronic) (peripheral): Secondary | ICD-10-CM | POA: Diagnosis not present

## 2016-01-15 DIAGNOSIS — F22 Delusional disorders: Secondary | ICD-10-CM | POA: Diagnosis not present

## 2016-01-15 DIAGNOSIS — N189 Chronic kidney disease, unspecified: Secondary | ICD-10-CM | POA: Diagnosis not present

## 2016-01-16 DIAGNOSIS — C44319 Basal cell carcinoma of skin of other parts of face: Secondary | ICD-10-CM | POA: Diagnosis not present

## 2016-01-17 DIAGNOSIS — Z7982 Long term (current) use of aspirin: Secondary | ICD-10-CM | POA: Diagnosis not present

## 2016-01-17 DIAGNOSIS — F22 Delusional disorders: Secondary | ICD-10-CM | POA: Diagnosis not present

## 2016-01-17 DIAGNOSIS — L97512 Non-pressure chronic ulcer of other part of right foot with fat layer exposed: Secondary | ICD-10-CM | POA: Diagnosis not present

## 2016-01-17 DIAGNOSIS — I872 Venous insufficiency (chronic) (peripheral): Secondary | ICD-10-CM | POA: Diagnosis not present

## 2016-01-17 DIAGNOSIS — I890 Lymphedema, not elsewhere classified: Secondary | ICD-10-CM | POA: Diagnosis not present

## 2016-01-19 DIAGNOSIS — F22 Delusional disorders: Secondary | ICD-10-CM | POA: Diagnosis not present

## 2016-01-19 DIAGNOSIS — I872 Venous insufficiency (chronic) (peripheral): Secondary | ICD-10-CM | POA: Diagnosis not present

## 2016-01-19 DIAGNOSIS — L97512 Non-pressure chronic ulcer of other part of right foot with fat layer exposed: Secondary | ICD-10-CM | POA: Diagnosis not present

## 2016-01-19 DIAGNOSIS — I890 Lymphedema, not elsewhere classified: Secondary | ICD-10-CM | POA: Diagnosis not present

## 2016-01-19 DIAGNOSIS — N189 Chronic kidney disease, unspecified: Secondary | ICD-10-CM | POA: Diagnosis not present

## 2016-01-19 DIAGNOSIS — L97219 Non-pressure chronic ulcer of right calf with unspecified severity: Secondary | ICD-10-CM | POA: Diagnosis not present

## 2016-01-23 DIAGNOSIS — N189 Chronic kidney disease, unspecified: Secondary | ICD-10-CM | POA: Diagnosis not present

## 2016-01-23 DIAGNOSIS — I872 Venous insufficiency (chronic) (peripheral): Secondary | ICD-10-CM | POA: Diagnosis not present

## 2016-01-23 DIAGNOSIS — L97512 Non-pressure chronic ulcer of other part of right foot with fat layer exposed: Secondary | ICD-10-CM | POA: Diagnosis not present

## 2016-01-23 DIAGNOSIS — I890 Lymphedema, not elsewhere classified: Secondary | ICD-10-CM | POA: Diagnosis not present

## 2016-01-23 DIAGNOSIS — F22 Delusional disorders: Secondary | ICD-10-CM | POA: Diagnosis not present

## 2016-01-23 DIAGNOSIS — L97219 Non-pressure chronic ulcer of right calf with unspecified severity: Secondary | ICD-10-CM | POA: Diagnosis not present

## 2016-01-25 DIAGNOSIS — I890 Lymphedema, not elsewhere classified: Secondary | ICD-10-CM | POA: Diagnosis not present

## 2016-01-25 DIAGNOSIS — I872 Venous insufficiency (chronic) (peripheral): Secondary | ICD-10-CM | POA: Diagnosis not present

## 2016-01-25 DIAGNOSIS — F22 Delusional disorders: Secondary | ICD-10-CM | POA: Diagnosis not present

## 2016-01-25 DIAGNOSIS — Z7982 Long term (current) use of aspirin: Secondary | ICD-10-CM | POA: Diagnosis not present

## 2016-01-25 DIAGNOSIS — L97512 Non-pressure chronic ulcer of other part of right foot with fat layer exposed: Secondary | ICD-10-CM | POA: Diagnosis not present

## 2016-01-25 DIAGNOSIS — L97312 Non-pressure chronic ulcer of right ankle with fat layer exposed: Secondary | ICD-10-CM | POA: Diagnosis not present

## 2016-01-30 DIAGNOSIS — M8580 Other specified disorders of bone density and structure, unspecified site: Secondary | ICD-10-CM | POA: Diagnosis not present

## 2016-01-30 DIAGNOSIS — E039 Hypothyroidism, unspecified: Secondary | ICD-10-CM | POA: Diagnosis not present

## 2016-01-30 DIAGNOSIS — Z0000 Encounter for general adult medical examination without abnormal findings: Secondary | ICD-10-CM | POA: Diagnosis not present

## 2016-01-31 DIAGNOSIS — Z7982 Long term (current) use of aspirin: Secondary | ICD-10-CM | POA: Diagnosis not present

## 2016-01-31 DIAGNOSIS — I872 Venous insufficiency (chronic) (peripheral): Secondary | ICD-10-CM | POA: Diagnosis not present

## 2016-01-31 DIAGNOSIS — I890 Lymphedema, not elsewhere classified: Secondary | ICD-10-CM | POA: Diagnosis not present

## 2016-01-31 DIAGNOSIS — L97512 Non-pressure chronic ulcer of other part of right foot with fat layer exposed: Secondary | ICD-10-CM | POA: Diagnosis not present

## 2016-01-31 DIAGNOSIS — L97212 Non-pressure chronic ulcer of right calf with fat layer exposed: Secondary | ICD-10-CM | POA: Diagnosis not present

## 2016-01-31 DIAGNOSIS — F22 Delusional disorders: Secondary | ICD-10-CM | POA: Diagnosis not present

## 2016-02-06 DIAGNOSIS — L97512 Non-pressure chronic ulcer of other part of right foot with fat layer exposed: Secondary | ICD-10-CM | POA: Diagnosis not present

## 2016-02-06 DIAGNOSIS — F22 Delusional disorders: Secondary | ICD-10-CM | POA: Diagnosis not present

## 2016-02-06 DIAGNOSIS — I890 Lymphedema, not elsewhere classified: Secondary | ICD-10-CM | POA: Diagnosis not present

## 2016-02-06 DIAGNOSIS — I872 Venous insufficiency (chronic) (peripheral): Secondary | ICD-10-CM | POA: Diagnosis not present

## 2016-02-06 DIAGNOSIS — Z7982 Long term (current) use of aspirin: Secondary | ICD-10-CM | POA: Diagnosis not present

## 2016-02-13 DIAGNOSIS — L089 Local infection of the skin and subcutaneous tissue, unspecified: Secondary | ICD-10-CM | POA: Diagnosis not present

## 2016-02-13 DIAGNOSIS — F22 Delusional disorders: Secondary | ICD-10-CM | POA: Diagnosis not present

## 2016-02-13 DIAGNOSIS — I872 Venous insufficiency (chronic) (peripheral): Secondary | ICD-10-CM | POA: Diagnosis not present

## 2016-02-13 DIAGNOSIS — I890 Lymphedema, not elsewhere classified: Secondary | ICD-10-CM | POA: Diagnosis not present

## 2016-02-13 DIAGNOSIS — L97512 Non-pressure chronic ulcer of other part of right foot with fat layer exposed: Secondary | ICD-10-CM | POA: Diagnosis not present

## 2016-02-13 DIAGNOSIS — Z7982 Long term (current) use of aspirin: Secondary | ICD-10-CM | POA: Diagnosis not present

## 2016-02-17 ENCOUNTER — Other Ambulatory Visit: Payer: Self-pay | Admitting: Hematology and Oncology

## 2016-02-20 DIAGNOSIS — Z7982 Long term (current) use of aspirin: Secondary | ICD-10-CM | POA: Diagnosis not present

## 2016-02-20 DIAGNOSIS — L97512 Non-pressure chronic ulcer of other part of right foot with fat layer exposed: Secondary | ICD-10-CM | POA: Diagnosis not present

## 2016-02-20 DIAGNOSIS — L089 Local infection of the skin and subcutaneous tissue, unspecified: Secondary | ICD-10-CM | POA: Diagnosis not present

## 2016-02-20 DIAGNOSIS — I872 Venous insufficiency (chronic) (peripheral): Secondary | ICD-10-CM | POA: Diagnosis not present

## 2016-02-20 DIAGNOSIS — I890 Lymphedema, not elsewhere classified: Secondary | ICD-10-CM | POA: Diagnosis not present

## 2016-02-20 DIAGNOSIS — F22 Delusional disorders: Secondary | ICD-10-CM | POA: Diagnosis not present

## 2016-02-22 DIAGNOSIS — R109 Unspecified abdominal pain: Secondary | ICD-10-CM | POA: Diagnosis not present

## 2016-02-27 DIAGNOSIS — Z85828 Personal history of other malignant neoplasm of skin: Secondary | ICD-10-CM | POA: Diagnosis not present

## 2016-02-27 DIAGNOSIS — L570 Actinic keratosis: Secondary | ICD-10-CM | POA: Diagnosis not present

## 2016-02-28 DIAGNOSIS — Z7982 Long term (current) use of aspirin: Secondary | ICD-10-CM | POA: Diagnosis not present

## 2016-02-28 DIAGNOSIS — L97512 Non-pressure chronic ulcer of other part of right foot with fat layer exposed: Secondary | ICD-10-CM | POA: Diagnosis not present

## 2016-02-28 DIAGNOSIS — I872 Venous insufficiency (chronic) (peripheral): Secondary | ICD-10-CM | POA: Diagnosis not present

## 2016-02-28 DIAGNOSIS — I890 Lymphedema, not elsewhere classified: Secondary | ICD-10-CM | POA: Diagnosis not present

## 2016-02-28 DIAGNOSIS — F22 Delusional disorders: Secondary | ICD-10-CM | POA: Diagnosis not present

## 2016-03-03 ENCOUNTER — Other Ambulatory Visit: Payer: Self-pay | Admitting: Hematology and Oncology

## 2016-03-03 DIAGNOSIS — C50911 Malignant neoplasm of unspecified site of right female breast: Secondary | ICD-10-CM

## 2016-03-03 DIAGNOSIS — C50912 Malignant neoplasm of unspecified site of left female breast: Principal | ICD-10-CM

## 2016-03-06 DIAGNOSIS — L97512 Non-pressure chronic ulcer of other part of right foot with fat layer exposed: Secondary | ICD-10-CM | POA: Diagnosis not present

## 2016-03-06 DIAGNOSIS — I872 Venous insufficiency (chronic) (peripheral): Secondary | ICD-10-CM | POA: Diagnosis not present

## 2016-03-06 DIAGNOSIS — F22 Delusional disorders: Secondary | ICD-10-CM | POA: Diagnosis not present

## 2016-03-06 DIAGNOSIS — Z7982 Long term (current) use of aspirin: Secondary | ICD-10-CM | POA: Diagnosis not present

## 2016-03-06 DIAGNOSIS — I890 Lymphedema, not elsewhere classified: Secondary | ICD-10-CM | POA: Diagnosis not present

## 2016-03-12 DIAGNOSIS — I872 Venous insufficiency (chronic) (peripheral): Secondary | ICD-10-CM | POA: Diagnosis not present

## 2016-03-12 DIAGNOSIS — I890 Lymphedema, not elsewhere classified: Secondary | ICD-10-CM | POA: Diagnosis not present

## 2016-03-12 DIAGNOSIS — L97512 Non-pressure chronic ulcer of other part of right foot with fat layer exposed: Secondary | ICD-10-CM | POA: Diagnosis not present

## 2016-03-12 DIAGNOSIS — Z7982 Long term (current) use of aspirin: Secondary | ICD-10-CM | POA: Diagnosis not present

## 2016-03-12 DIAGNOSIS — F22 Delusional disorders: Secondary | ICD-10-CM | POA: Diagnosis not present

## 2016-03-19 ENCOUNTER — Encounter: Payer: Medicare Other | Admitting: *Deleted

## 2016-03-19 ENCOUNTER — Telehealth: Payer: Self-pay | Admitting: Cardiology

## 2016-03-19 DIAGNOSIS — I872 Venous insufficiency (chronic) (peripheral): Secondary | ICD-10-CM | POA: Diagnosis not present

## 2016-03-19 DIAGNOSIS — F22 Delusional disorders: Secondary | ICD-10-CM | POA: Diagnosis not present

## 2016-03-19 DIAGNOSIS — L97512 Non-pressure chronic ulcer of other part of right foot with fat layer exposed: Secondary | ICD-10-CM | POA: Diagnosis not present

## 2016-03-19 DIAGNOSIS — I890 Lymphedema, not elsewhere classified: Secondary | ICD-10-CM | POA: Diagnosis not present

## 2016-03-19 NOTE — Telephone Encounter (Signed)
Attempted to confirm remote transmission with pt. No answer and was unable to leave a message.   

## 2016-03-20 ENCOUNTER — Encounter: Payer: Self-pay | Admitting: Cardiology

## 2016-03-25 DIAGNOSIS — I872 Venous insufficiency (chronic) (peripheral): Secondary | ICD-10-CM | POA: Diagnosis not present

## 2016-03-25 DIAGNOSIS — I890 Lymphedema, not elsewhere classified: Secondary | ICD-10-CM | POA: Diagnosis not present

## 2016-03-25 DIAGNOSIS — Z7982 Long term (current) use of aspirin: Secondary | ICD-10-CM | POA: Diagnosis not present

## 2016-03-25 DIAGNOSIS — L97512 Non-pressure chronic ulcer of other part of right foot with fat layer exposed: Secondary | ICD-10-CM | POA: Diagnosis not present

## 2016-03-25 DIAGNOSIS — F22 Delusional disorders: Secondary | ICD-10-CM | POA: Diagnosis not present

## 2016-04-02 DIAGNOSIS — L97512 Non-pressure chronic ulcer of other part of right foot with fat layer exposed: Secondary | ICD-10-CM | POA: Diagnosis not present

## 2016-04-02 DIAGNOSIS — I872 Venous insufficiency (chronic) (peripheral): Secondary | ICD-10-CM | POA: Diagnosis not present

## 2016-04-02 DIAGNOSIS — F22 Delusional disorders: Secondary | ICD-10-CM | POA: Diagnosis not present

## 2016-04-02 DIAGNOSIS — I890 Lymphedema, not elsewhere classified: Secondary | ICD-10-CM | POA: Diagnosis not present

## 2016-04-02 DIAGNOSIS — Z7982 Long term (current) use of aspirin: Secondary | ICD-10-CM | POA: Diagnosis not present

## 2016-04-03 DIAGNOSIS — R197 Diarrhea, unspecified: Secondary | ICD-10-CM | POA: Diagnosis not present

## 2016-04-08 DIAGNOSIS — Z7982 Long term (current) use of aspirin: Secondary | ICD-10-CM | POA: Diagnosis not present

## 2016-04-08 DIAGNOSIS — I872 Venous insufficiency (chronic) (peripheral): Secondary | ICD-10-CM | POA: Diagnosis not present

## 2016-04-08 DIAGNOSIS — F22 Delusional disorders: Secondary | ICD-10-CM | POA: Diagnosis not present

## 2016-04-08 DIAGNOSIS — L97512 Non-pressure chronic ulcer of other part of right foot with fat layer exposed: Secondary | ICD-10-CM | POA: Diagnosis not present

## 2016-04-08 DIAGNOSIS — I890 Lymphedema, not elsewhere classified: Secondary | ICD-10-CM | POA: Diagnosis not present

## 2016-04-09 DIAGNOSIS — H40013 Open angle with borderline findings, low risk, bilateral: Secondary | ICD-10-CM | POA: Diagnosis not present

## 2016-04-09 DIAGNOSIS — Z961 Presence of intraocular lens: Secondary | ICD-10-CM | POA: Diagnosis not present

## 2016-04-09 DIAGNOSIS — H401132 Primary open-angle glaucoma, bilateral, moderate stage: Secondary | ICD-10-CM | POA: Diagnosis not present

## 2016-04-09 DIAGNOSIS — H43811 Vitreous degeneration, right eye: Secondary | ICD-10-CM | POA: Diagnosis not present

## 2016-04-11 ENCOUNTER — Ambulatory Visit (INDEPENDENT_AMBULATORY_CARE_PROVIDER_SITE_OTHER): Payer: Medicare Other | Admitting: *Deleted

## 2016-04-11 DIAGNOSIS — I442 Atrioventricular block, complete: Secondary | ICD-10-CM | POA: Diagnosis not present

## 2016-04-12 DIAGNOSIS — E559 Vitamin D deficiency, unspecified: Secondary | ICD-10-CM | POA: Diagnosis not present

## 2016-04-12 DIAGNOSIS — M8580 Other specified disorders of bone density and structure, unspecified site: Secondary | ICD-10-CM | POA: Diagnosis not present

## 2016-04-12 DIAGNOSIS — Z0000 Encounter for general adult medical examination without abnormal findings: Secondary | ICD-10-CM | POA: Diagnosis not present

## 2016-04-12 DIAGNOSIS — E039 Hypothyroidism, unspecified: Secondary | ICD-10-CM | POA: Diagnosis not present

## 2016-04-16 DIAGNOSIS — L97512 Non-pressure chronic ulcer of other part of right foot with fat layer exposed: Secondary | ICD-10-CM | POA: Diagnosis not present

## 2016-04-16 DIAGNOSIS — I890 Lymphedema, not elsewhere classified: Secondary | ICD-10-CM | POA: Diagnosis not present

## 2016-04-16 DIAGNOSIS — F22 Delusional disorders: Secondary | ICD-10-CM | POA: Diagnosis not present

## 2016-04-16 DIAGNOSIS — Z7982 Long term (current) use of aspirin: Secondary | ICD-10-CM | POA: Diagnosis not present

## 2016-04-16 DIAGNOSIS — I872 Venous insufficiency (chronic) (peripheral): Secondary | ICD-10-CM | POA: Diagnosis not present

## 2016-04-16 NOTE — Progress Notes (Signed)
Remote pacemaker transmission.   

## 2016-04-17 ENCOUNTER — Encounter: Payer: Self-pay | Admitting: Cardiology

## 2016-04-24 DIAGNOSIS — L97512 Non-pressure chronic ulcer of other part of right foot with fat layer exposed: Secondary | ICD-10-CM | POA: Diagnosis not present

## 2016-04-24 DIAGNOSIS — I890 Lymphedema, not elsewhere classified: Secondary | ICD-10-CM | POA: Diagnosis not present

## 2016-04-24 DIAGNOSIS — I872 Venous insufficiency (chronic) (peripheral): Secondary | ICD-10-CM | POA: Diagnosis not present

## 2016-04-24 DIAGNOSIS — F22 Delusional disorders: Secondary | ICD-10-CM | POA: Diagnosis not present

## 2016-04-26 LAB — CUP PACEART REMOTE DEVICE CHECK
Battery Remaining Longevity: 101 mo
Brady Statistic RV Percent Paced: 100 %
Implantable Lead Model: 4076
Lead Channel Impedance Value: 404 Ohm
Lead Channel Pacing Threshold Amplitude: 1 V
Lead Channel Setting Pacing Amplitude: 2.25 V
Lead Channel Setting Pacing Pulse Width: 0.4 ms
MDC IDC LEAD IMPLANT DT: 20141208
MDC IDC LEAD LOCATION: 753860
MDC IDC MSMT BATTERY IMPEDANCE: 233 Ohm
MDC IDC MSMT BATTERY VOLTAGE: 2.78 V
MDC IDC MSMT LEADCHNL RA IMPEDANCE VALUE: 0 Ohm
MDC IDC MSMT LEADCHNL RV PACING THRESHOLD PULSEWIDTH: 0.4 ms
MDC IDC PG IMPLANT DT: 20161027
MDC IDC SESS DTM: 20171214183228
MDC IDC SET LEADCHNL RV SENSING SENSITIVITY: 2.8 mV

## 2016-05-01 DIAGNOSIS — F22 Delusional disorders: Secondary | ICD-10-CM | POA: Diagnosis not present

## 2016-05-01 DIAGNOSIS — I890 Lymphedema, not elsewhere classified: Secondary | ICD-10-CM | POA: Diagnosis not present

## 2016-05-01 DIAGNOSIS — L97512 Non-pressure chronic ulcer of other part of right foot with fat layer exposed: Secondary | ICD-10-CM | POA: Diagnosis not present

## 2016-05-01 DIAGNOSIS — I872 Venous insufficiency (chronic) (peripheral): Secondary | ICD-10-CM | POA: Diagnosis not present

## 2016-05-02 ENCOUNTER — Ambulatory Visit: Payer: Self-pay | Admitting: Cardiology

## 2016-05-04 ENCOUNTER — Emergency Department (HOSPITAL_COMMUNITY)
Admission: EM | Admit: 2016-05-04 | Discharge: 2016-05-05 | Disposition: A | Discharge status: Other Acute Inpatient Hospital | Payer: Medicare Other | Attending: Emergency Medicine | Admitting: Emergency Medicine

## 2016-05-04 DIAGNOSIS — F29 Unspecified psychosis not due to a substance or known physiological condition: Secondary | ICD-10-CM | POA: Insufficient documentation

## 2016-05-04 DIAGNOSIS — R4182 Altered mental status, unspecified: Secondary | ICD-10-CM | POA: Insufficient documentation

## 2016-05-04 DIAGNOSIS — Z79899 Other long term (current) drug therapy: Secondary | ICD-10-CM | POA: Insufficient documentation

## 2016-05-04 DIAGNOSIS — Z7982 Long term (current) use of aspirin: Secondary | ICD-10-CM | POA: Diagnosis not present

## 2016-05-04 DIAGNOSIS — Z950 Presence of cardiac pacemaker: Secondary | ICD-10-CM | POA: Diagnosis not present

## 2016-05-04 DIAGNOSIS — M25552 Pain in left hip: Secondary | ICD-10-CM | POA: Insufficient documentation

## 2016-05-04 DIAGNOSIS — F21 Schizotypal disorder: Secondary | ICD-10-CM | POA: Diagnosis not present

## 2016-05-04 DIAGNOSIS — M25559 Pain in unspecified hip: Secondary | ICD-10-CM | POA: Diagnosis not present

## 2016-05-04 DIAGNOSIS — F23 Brief psychotic disorder: Secondary | ICD-10-CM | POA: Diagnosis not present

## 2016-05-04 HISTORY — DX: Delusional disorders: F22
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
## 2016-05-05 ENCOUNTER — Encounter (HOSPITAL_COMMUNITY): Payer: Self-pay | Admitting: Emergency Medicine

## 2016-05-05 ENCOUNTER — Emergency Department (HOSPITAL_COMMUNITY): Payer: Medicare Other

## 2016-05-05 DIAGNOSIS — I2510 Atherosclerotic heart disease of native coronary artery without angina pectoris: Secondary | ICD-10-CM | POA: Diagnosis present

## 2016-05-05 DIAGNOSIS — F22 Delusional disorders: Secondary | ICD-10-CM | POA: Diagnosis not present

## 2016-05-05 DIAGNOSIS — Z950 Presence of cardiac pacemaker: Secondary | ICD-10-CM | POA: Diagnosis not present

## 2016-05-05 DIAGNOSIS — F411 Generalized anxiety disorder: Secondary | ICD-10-CM | POA: Diagnosis not present

## 2016-05-05 DIAGNOSIS — Z885 Allergy status to narcotic agent status: Secondary | ICD-10-CM | POA: Diagnosis not present

## 2016-05-05 DIAGNOSIS — Z79899 Other long term (current) drug therapy: Secondary | ICD-10-CM | POA: Diagnosis not present

## 2016-05-05 DIAGNOSIS — M25552 Pain in left hip: Secondary | ICD-10-CM | POA: Diagnosis not present

## 2016-05-05 DIAGNOSIS — Z91048 Other nonmedicinal substance allergy status: Secondary | ICD-10-CM | POA: Diagnosis not present

## 2016-05-05 DIAGNOSIS — Z7982 Long term (current) use of aspirin: Secondary | ICD-10-CM | POA: Diagnosis not present

## 2016-05-05 DIAGNOSIS — Z853 Personal history of malignant neoplasm of breast: Secondary | ICD-10-CM | POA: Diagnosis not present

## 2016-05-05 DIAGNOSIS — R197 Diarrhea, unspecified: Secondary | ICD-10-CM | POA: Diagnosis not present

## 2016-05-05 DIAGNOSIS — F29 Unspecified psychosis not due to a substance or known physiological condition: Secondary | ICD-10-CM | POA: Diagnosis not present

## 2016-05-05 DIAGNOSIS — A09 Infectious gastroenteritis and colitis, unspecified: Secondary | ICD-10-CM | POA: Diagnosis not present

## 2016-05-05 DIAGNOSIS — Z888 Allergy status to other drugs, medicaments and biological substances status: Secondary | ICD-10-CM | POA: Diagnosis not present

## 2016-05-05 DIAGNOSIS — Z881 Allergy status to other antibiotic agents status: Secondary | ICD-10-CM | POA: Diagnosis not present

## 2016-05-05 DIAGNOSIS — E039 Hypothyroidism, unspecified: Secondary | ICD-10-CM | POA: Diagnosis not present

## 2016-05-05 DIAGNOSIS — R4182 Altered mental status, unspecified: Secondary | ICD-10-CM | POA: Diagnosis not present

## 2016-05-05 LAB — COMPREHENSIVE METABOLIC PANEL
ALT: 15 U/L (ref 14–54)
ANION GAP: 10 (ref 5–15)
AST: 21 U/L (ref 15–41)
Albumin: 3.9 g/dL (ref 3.5–5.0)
Alkaline Phosphatase: 85 U/L (ref 38–126)
BILIRUBIN TOTAL: 0.8 mg/dL (ref 0.3–1.2)
BUN: 18 mg/dL (ref 6–20)
CO2: 26 mmol/L (ref 22–32)
Calcium: 9.2 mg/dL (ref 8.9–10.3)
Chloride: 106 mmol/L (ref 101–111)
Creatinine, Ser: 1.24 mg/dL — ABNORMAL HIGH (ref 0.44–1.00)
GFR, EST AFRICAN AMERICAN: 44 mL/min — AB (ref >60)
GFR, EST NON AFRICAN AMERICAN: 38 mL/min — AB (ref >60)
Glucose, Bld: 97 mg/dL (ref 65–99)
POTASSIUM: 3.6 mmol/L (ref 3.5–5.1)
Sodium: 142 mmol/L (ref 135–145)
TOTAL PROTEIN: 7 g/dL (ref 6.5–8.1)

## 2016-05-05 LAB — URINALYSIS, ROUTINE W REFLEX MICROSCOPIC
BACTERIA UA: NONE SEEN
BILIRUBIN URINE: NEGATIVE
Glucose, UA: NEGATIVE mg/dL
Hgb urine dipstick: NEGATIVE
KETONES UR: NEGATIVE mg/dL
NITRITE: NEGATIVE
PH: 5 (ref 5.0–8.0)
Protein, ur: NEGATIVE mg/dL
Specific Gravity, Urine: 1.017 (ref 1.005–1.030)
Squamous Epithelial / LPF: NONE SEEN

## 2016-05-05 LAB — RAPID URINE DRUG SCREEN, HOSP PERFORMED
Amphetamines: NOT DETECTED
Barbiturates: NOT DETECTED
Benzodiazepines: NOT DETECTED
Cocaine: NOT DETECTED
OPIATES: NOT DETECTED
TETRAHYDROCANNABINOL: NOT DETECTED

## 2016-05-05 LAB — CBC WITH DIFFERENTIAL/PLATELET
Basophils Absolute: 0 10*3/uL (ref 0.0–0.1)
Basophils Relative: 1 %
EOS PCT: 4 %
Eosinophils Absolute: 0.4 10*3/uL (ref 0.0–0.7)
HCT: 46 % (ref 36.0–46.0)
Hemoglobin: 15.5 g/dL — ABNORMAL HIGH (ref 12.0–15.0)
LYMPHS PCT: 20 %
Lymphs Abs: 1.7 10*3/uL (ref 0.7–4.0)
MCH: 32 pg (ref 26.0–34.0)
MCHC: 33.7 g/dL (ref 30.0–36.0)
MCV: 94.8 fL (ref 78.0–100.0)
MONO ABS: 0.7 10*3/uL (ref 0.1–1.0)
MONOS PCT: 8 %
NEUTROS ABS: 5.7 10*3/uL (ref 1.7–7.7)
Neutrophils Relative %: 67 %
Platelets: 223 10*3/uL (ref 150–400)
RBC: 4.85 MIL/uL (ref 3.87–5.11)
RDW: 13.4 % (ref 11.5–15.5)
WBC: 8.4 10*3/uL (ref 4.0–10.5)

## 2016-05-05 LAB — SALICYLATE LEVEL

## 2016-05-05 LAB — AMMONIA: Ammonia: 37 umol/L — ABNORMAL HIGH (ref 9–35)

## 2016-05-05 LAB — ETHANOL

## 2016-05-05 LAB — TSH: TSH: 3.612 u[IU]/mL (ref 0.350–4.500)

## 2016-05-05 LAB — ACETAMINOPHEN LEVEL

## 2016-05-05 MED ORDER — CEPHALEXIN 250 MG PO CAPS
500.0000 mg | ORAL_CAPSULE | Freq: Three times a day (TID) | ORAL | Status: DC
  Administered 2016-05-05 (×2): 500 mg via ORAL
  Filled 2016-05-05 (×2): qty 2

## 2016-05-05 MED ORDER — FUROSEMIDE 20 MG PO TABS
20.0000 mg | ORAL_TABLET | Freq: Every day | ORAL | Status: DC
  Administered 2016-05-05: 20 mg via ORAL
  Filled 2016-05-05: qty 1

## 2016-05-05 MED ORDER — MIDODRINE HCL 2.5 MG PO TABS
2.5000 mg | ORAL_TABLET | Freq: Two times a day (BID) | ORAL | Status: DC
  Administered 2016-05-05: 2.5 mg via ORAL
  Filled 2016-05-05 (×2): qty 1

## 2016-05-05 MED ORDER — LEVOTHYROXINE SODIUM 50 MCG PO TABS: 50.0000 ug | ORAL_TABLET | Freq: Every day | ORAL | Status: DC

## 2016-05-05 MED ORDER — LETROZOLE 2.5 MG PO TABS
2.5000 mg | ORAL_TABLET | Freq: Every day | ORAL | Status: DC
  Administered 2016-05-05: 2.5 mg via ORAL
  Filled 2016-05-05: qty 1

## 2016-05-05 MED ORDER — ASPIRIN EC 81 MG PO TBEC
81.0000 mg | DELAYED_RELEASE_TABLET | Freq: Every day | ORAL | Status: DC
  Administered 2016-05-05: 81 mg via ORAL
  Filled 2016-05-05: qty 1

## 2016-05-05 NOTE — ED Notes (Signed)
Pt verbalized understanding and signed Medical Clearance Pt Policy form - pt given a copy. Copy placed on clipboard.

## 2016-05-05 NOTE — Progress Notes (Signed)
Patient has been referred for geriatric inpatient treatment program at: [facility], [place], [place], [place] (per [place], looking at referrals for geriatric only), [place], [place], [place].  CSW will continue to seek placement.  Verlon Setting, [place] Disposition staff 05/05/2016 10:14 AM

## 2016-05-05 NOTE — ED Notes (Addendum)
Pt arrived to Pod F 7 - ambulatory. Pt wearing blue paper scrubs, personal socks and shoes. Pt has on 2 rings on left hand and 1 ring on right. Pt wanded by security. Pt's belongings - 1 labeled belongings and 1 labeled white trash bag - placed at nurses' desk for inventory.

## 2016-05-05 NOTE — Progress Notes (Addendum)
Please fax IVC papers to [facility] at fax: [phone]. [facility] RN Wells Guiles informed. Writer informed Mayer Camel that patient is being IVC'd.  Verlon Setting, [place] Disposition staff 05/05/2016 12:59 PM
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
## 2016-05-05 NOTE — ED Notes (Signed)
Dr Winfred Leeds to complete IVC paperwork d/t psychotic and delusional. Pt continues to state she does not want to go there d/t "they were listening to the people" in her home. States she is going to ask for new primary care dr d/t "Dr Maudie Mercury is listening to the people in my home too".

## 2016-05-05 NOTE — ED Notes (Signed)
Pt given water per RN's request

## 2016-05-05 NOTE — ED Notes (Signed)
Ford from [facility] called and began TTS.

## 2016-05-05 NOTE — ED Notes (Signed)
Left message for [facility] Deputy advising of pending transport to [place].

## 2016-05-05 NOTE — ED Notes (Addendum)
Pt asking repeatedly if she may go to [facility].

## 2016-05-05 NOTE — ED Notes (Signed)
Pt being placed in paper scrubs.

## 2016-05-05 NOTE — ED Notes (Signed)
Pt is sitting on the side of the bed eating her breakfast

## 2016-05-05 NOTE — Progress Notes (Signed)
Patient has been accepted at [facility] in [place], per [place]. Patient will be going to the [facility]. Accepted to Dr. Colletta Maryland Exum, room 52 bed 2. Please call report at [phone], patient can arrive before 23:00 tonight or tomorrow after 6:00. [facility] RN Wells Guiles notified.  Verlon Setting, [place] Disposition staff 05/05/2016 12:10 PM

## 2016-05-05 NOTE — ED Notes (Signed)
Pt ambulatory to desk - attempting to call friends - no answer. Pt
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
## 2016-05-05 NOTE — ED Notes (Signed)
IVC paperwork being served by [facility].

## 2016-05-05 NOTE — ED Notes (Addendum)
Original IVC paperwork placed in folder for Magistrate. Copy faxed to Mayer Camel [phone] and copy sent to Medical Records. Left message for [facility] - advising paperwork in hand and pt needs to be transported.
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
## 2016-05-05 NOTE — ED Notes (Signed)
Called staffing for a [employer] cabin crew.
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
## 2016-05-05 NOTE — ED Notes (Signed)
Pt given cracker, graham cracker and water as requested.

## 2016-05-05 NOTE — Progress Notes (Addendum)
[facility] RN Wells Guiles informed Probation officer that patient has declined bed at [facility].  Patient has been experiencing psychotic symptoms.  Writer advised that patient would need to be IVC'd due to psychotic symptoms. [facility] RN Wells Guiles aware.  Case staffed with Jinny Blossom FNP who recommend patient to be IVC'd for admission at [place], [place] Disposition staff 05/05/2016 12:33 PM

## 2016-05-05 NOTE — ED Notes (Signed)
IVC paperwork faxed to [facility]. Called and verified received and will have papers served.

## 2016-05-05 NOTE — ED Notes (Signed)
Pt at nurses' desk looking for phone numbers she has listed on a piece of paper - RN assisting. Pt aware accepted to [facility] - states she does not want to go back there. States did not have pleasant experience. States "there is nothing wrong w/my brain and I was there for 3 weeks before they tested my brain and saw there was nothing wrong with it". States she would rather go back home and "deal with the people who are bothering me rather than go there".

## 2016-05-06 LAB — URINE CULTURE

## 2016-05-11 DIAGNOSIS — R197 Diarrhea, unspecified: Secondary | ICD-10-CM | POA: Insufficient documentation

## 2016-05-26 NOTE — Progress Notes (Signed)
[name] Date of Birth: 1927/06/04 Medical Record [id number]  History of Present Illness: [name] is seen today for follow up of complete heart block.  She has a history of complete heart block with 12 second pause and underwent permanent pacemaker implant on 10/09/2007 with a Medtronic adapta VVI pacemaker. She had revision of her pacemaker on February 23, 2015. She was admitted at that time with a GI bleed felt to be diverticular. No recurrence.  She also has a history of orthostatic dizziness managed with midodrine. She has no history of coronary disease or congestive heart failure.  On follow up today she states she is not doing well. She was admitted to behavioral health with [facility] in [place] in January with paranoia and delusional thoughts. Was started on Respirdal but is not taking this. States there is nothing wrong with her brain. She has some swelling in her lower extremities. She is eating a lot of salt. Legs were wrapped but she cut wrapping off. Her history today is very rambling and while some of her medications were changed in the hospital she just resumed what she was on before.   Current Outpatient Prescriptions on File Prior to Visit  Medication Sig Dispense Refill  . aspirin EC 81 MG tablet Take 81 mg by mouth daily before breakfast.    . Calcium Carbonate-Vitamin D (CALTRATE 600+D PO) Take 600 mg by mouth daily after breakfast.    . furosemide (LASIX) 20 MG tablet Take 20 mg by mouth daily with breakfast.     . letrozole (FEMARA) 2.5 MG tablet TAKE 1 TABLET DAILY (Patient taking differently: TAKE 1 TABLET DAILY WITH BREAKFAST) 90 tablet 3  . levothyroxine (SYNTHROID, LEVOTHROID) 50 MCG tablet Take 50 mcg by mouth daily before breakfast.     . midodrine (PROAMATINE) 2.5 MG tablet Take 2.5 mg by mouth daily after breakfast.     . OVER THE COUNTER MEDICATION Place 1 drop into both eyes daily as needed. Over the counter lubricating eye drop     No current  facility-administered medications on file prior to visit.     Allergies  Allergen Reactions  . Erythromycin Hives  . Tape Other (See Comments)    BAND AIDS-SKIN IRRITATION  . Codeine Nausea And Vomiting  . Macrobid [Nitrofurantoin Macrocrystal] Nausea And Vomiting  . Tolterodine Nausea And Vomiting    Past Medical History:  Diagnosis Date  . Cataract   . Complete heart block (HCC)    s/p PPM implant (MDT) by Dr Blanch Media.  Atrial lead could not be paced at time of the procedure.  She has chronic AV dysociation  . Delusional disorder ([place])   . Exogenous obesity   . Hypercholesterolemia   . Invasive ductal carcinoma of breast ([place]) 03/2007   BILATERAL BREASTS  . Orthostatic hypotension    treated with midodrine by Dr Rollene [name]  . PONV (postoperative nausea and vomiting)   . Thyroid disease   . Venous insufficiency     Past Surgical History:  Procedure Laterality Date  . ABDOMINAL HYSTERECTOMY    . BREAST LUMPECTOMY Bilateral 2009  . CATARACT EXTRACTION     EYE SURGERY X 2  . CHOLECYSTECTOMY    . COLONOSCOPY N/A 02/16/2015   Procedure: COLONOSCOPY;  Surgeon: Wilford Corner, MD;  Location: [facility] ENDOSCOPY;  Service: Endoscopy;  Laterality: N/A;  . EP IMPLANTABLE DEVICE N/A 02/23/2015   pacemaker generator change (MDT Sensia SR) by Dr Rayann Heman   . ESOPHAGOGASTRODUODENOSCOPY N/A 02/16/2015   Procedure:  ESOPHAGOGASTRODUODENOSCOPY (EGD);  Surgeon: Wilford Corner, MD;  Location: [facility] ENDOSCOPY;  Service: Endoscopy;  Laterality: N/A;  . PACEMAKER INSERTION     MDT implanted by Dr Blanch Media.  Atrial lead placement was unsuccessful.  She has chronic AV dysociation  . remote right radical nephrectomy  2009    History  Smoking Status  . Never Smoker  Smokeless Tobacco  . Never Used    History  Alcohol Use No    Family History  Problem Relation Age of Onset  . Heart disease Maternal Grandmother   . Heart disease Mother     Review of Systems: As noted in history of present  illness.  All other systems were reviewed and are negative.  Physical Exam: BP (!) 141/91   Pulse 89   Ht 5' 7.5" (1.715 m)   Wt 210 lb 6.4 oz (95.4 kg)   BMI 32.47 kg/m  She is a morbidly obese, elderly white female in no acute distress. HEENT: Normal Lungs: Clear Cardiovascular: Regular rate and rhythm without gallop, murmur, or click. Her pacemaker site in the left chest  is normal. Abdomen: Obese, soft, nontender. No masses or hepatosplenomegaly. Extremities: 2+ LLE edema, 1+ on right. Pulses are 2+ and symmetric.  Some hyperpigmentation right ankle. Skin: Warm and dry Neuro: Alert and oriented x3. Cranial nerves II through XII are intact.  LABORATORY DATA: Lab Results  Component Value Date   WBC 8.4 05/05/2016   HGB 15.5 (H) 05/05/2016   HCT 46.0 05/05/2016   PLT 223 05/05/2016   GLUCOSE 97 05/05/2016   CHOL 203 (H) 12/22/2012   TRIG 140 12/22/2012   HDL 54 12/22/2012   LDLCALC 121 (H) 12/22/2012   ALT 15 05/05/2016   AST 21 05/05/2016   NA 142 05/05/2016   K 3.6 05/05/2016   CL 106 05/05/2016   CREATININE 1.24 (H) 05/05/2016   BUN 18 05/05/2016   CO2 26 05/05/2016   TSH 3.612 05/05/2016   INR 0.9 10/06/2008   HGBA1C 6.0 (H) 08/11/2015   Labs dated 04/12/16: cholesterol 178, triglycerides 101, LDL 94, HDL 64. BUN 18, creatinine 1.24. Other chemistries, TSH, CBC normal.  . Assessment / Plan: 1. History complete heart block status post permanent VVIR pacemaker placement in June of 2009. Revised 02/23/15. Clinically doing well. She is followed by Dr. Rayann Heman in the pacer clinic. I'll followup again in one year.  2. History of orthostatic hypotension-on midodrine. Asymptomatic.  3. Hyperlipidemia on statin therapy.  4. History of diverticular bleed.   5. Psychosis. Urged her to follow  Psychiatric treatment recommendations  6. LE edema due to venous stasis. Recommend sodium restriction, elevation of feet, lasix 20 mg daily, and compression hose.

## 2016-05-27 ENCOUNTER — Encounter: Payer: Self-pay | Admitting: Cardiology

## 2016-05-27 ENCOUNTER — Ambulatory Visit (INDEPENDENT_AMBULATORY_CARE_PROVIDER_SITE_OTHER): Payer: Medicare Other | Admitting: Cardiology

## 2016-05-27 VITALS — BP 141/91 | HR 89 | Ht 67.5 in | Wt 210.4 lb

## 2016-05-27 DIAGNOSIS — Z950 Presence of cardiac pacemaker: Secondary | ICD-10-CM | POA: Diagnosis not present

## 2016-05-27 DIAGNOSIS — I442 Atrioventricular block, complete: Secondary | ICD-10-CM | POA: Diagnosis not present

## 2016-05-27 DIAGNOSIS — I83893 Varicose veins of bilateral lower extremities with other complications: Secondary | ICD-10-CM

## 2016-05-27 NOTE — Patient Instructions (Addendum)
Continue your lasix 20 mg daily   Restrict your salt intake  Keep your feet elevated and wear compression hose.  I will see you in one year

## 2016-07-07 ENCOUNTER — Telehealth: Payer: Self-pay

## 2016-07-07 NOTE — Telephone Encounter (Signed)
Spoke with patient and she is aware of her new appt 

## 2016-07-12 DIAGNOSIS — L821 Other seborrheic keratosis: Secondary | ICD-10-CM | POA: Diagnosis not present

## 2016-07-12 DIAGNOSIS — Z85828 Personal history of other malignant neoplasm of skin: Secondary | ICD-10-CM | POA: Diagnosis not present

## 2016-07-16 ENCOUNTER — Ambulatory Visit (INDEPENDENT_AMBULATORY_CARE_PROVIDER_SITE_OTHER): Payer: Medicare Other | Admitting: *Deleted

## 2016-07-16 ENCOUNTER — Telehealth: Payer: Self-pay | Admitting: Cardiology

## 2016-07-16 DIAGNOSIS — I442 Atrioventricular block, complete: Secondary | ICD-10-CM | POA: Diagnosis not present

## 2016-07-16 NOTE — Telephone Encounter (Signed)
Spoke with pt and reminded pt of remote transmission that is due today. Pt verbalized understanding.   

## 2016-07-16 NOTE — Progress Notes (Signed)
Remote pacemaker transmission.   

## 2016-07-17 ENCOUNTER — Encounter: Payer: Self-pay | Admitting: Cardiology

## 2016-07-18 LAB — CUP PACEART REMOTE DEVICE CHECK
Brady Statistic RV Percent Paced: 100 %
Implantable Lead Implant Date: 20141208
Lead Channel Impedance Value: 0 Ohm
Lead Channel Impedance Value: 403 Ohm
Lead Channel Pacing Threshold Amplitude: 1.125 V
Lead Channel Setting Pacing Pulse Width: 0.4 ms
MDC IDC LEAD LOCATION: 753860
MDC IDC MSMT BATTERY IMPEDANCE: 281 Ohm
MDC IDC MSMT BATTERY REMAINING LONGEVITY: 97 mo
MDC IDC MSMT BATTERY VOLTAGE: 2.78 V
MDC IDC MSMT LEADCHNL RV PACING THRESHOLD PULSEWIDTH: 0.4 ms
MDC IDC PG IMPLANT DT: 20161027
MDC IDC SESS DTM: 20180320153634
MDC IDC SET LEADCHNL RV PACING AMPLITUDE: 2.25 V
MDC IDC SET LEADCHNL RV SENSING SENSITIVITY: 2 mV

## 2016-07-26 ENCOUNTER — Telehealth: Payer: Self-pay

## 2016-07-26 MED ORDER — LETROZOLE 2.5 MG PO TABS: 2.5000 mg | ORAL_TABLET | Freq: Every day | ORAL | 0 refills | Status: DC

## 2016-07-26 NOTE — Telephone Encounter (Signed)
Pt called to cancel her appointments altogether b/c she has too many doctor appts. She is still taking letrozole. Discussed with pt and we will r/s her appt to May. This RN will refill her letrozole qs to get to next appt. Rx sent to [facility]

## 2016-07-26 NOTE — Progress Notes (Signed)
Electrophysiology Office Note Date: 07/29/2016  ID:  [name], DOB [birth year], MRN [id number]  PCP: [name], MD Primary Cardiologist: Martinique Electrophysiologist: Allred  CC: Pacemaker follow-up  [name] is a 83 y.o. female seen today for Dr Rayann Heman. She presents today for routine electrophysiology followup.  Since last being seen in our clinic, the patient reports doing relatively well.  She was seen in the ER this morning for dizziness that improved with Meclizine. She did not take her Midodrine yesterday because she "had so much going on with Easter".  She does not typically have dizzy spells as long as she takes her medications as scheduled.   She had a recent admission to behavioral health at [facility] for paranoia and delusions in January of this year.  She states "that doctor is a fraud and I almost reported him".  She has chronic LE edema. She denies chest pain, palpitations, dyspnea, PND, orthopnea, nausea, vomiting, dizziness, syncope, weight gain, or early satiety.  Device History: MDT single chamber PPM implanted 2014 for complete heart block (atrial lead unable to be placed at that time); gen change 2016   Past Medical History:  Diagnosis Date  . Cataract   . Complete heart block (HCC)    s/p PPM implant (MDT) by Dr Blanch Media.  Atrial lead could not be paced at time of the procedure.  She has chronic AV dysociation  . Delusional disorder ([place])   . Exogenous obesity   . Hypercholesterolemia   . Invasive ductal carcinoma of breast ([place]) 03/2007   BILATERAL BREASTS  . Orthostatic hypotension    treated with midodrine by Dr Rollene [name]  . PONV (postoperative nausea and vomiting)   . Thyroid disease   . Venous insufficiency    Past Surgical History:  Procedure Laterality Date  . ABDOMINAL HYSTERECTOMY    . BREAST LUMPECTOMY Bilateral 2009  . CATARACT EXTRACTION     EYE SURGERY X 2  . CHOLECYSTECTOMY    . COLONOSCOPY N/A 02/16/2015   Procedure:  COLONOSCOPY;  Surgeon: Wilford Corner, MD;  Location[facility] ENDOSCOPY;  Service: Endoscopy;  Laterality: N/A;  . EP IMPLANTABLE DEVICE N/A 02/23/2015   pacemaker generator change (MDT Sensia SR) by Dr Rayann Heman   . ESOPHAGOGASTRODUODENOSCOPY N/A 02/16/2015   Procedure: ESOPHAGOGASTRODUODENOSCOPY (EGD);  Surgeon: Wilford Corner, MD;  Location: [facility] ENDOSCOPY;  Service: Endoscopy;  Laterality: N/A;  . PACEMAKER INSERTION     MDT implanted by Dr Blanch Media.  Atrial lead placement was unsuccessful.  She has chronic AV dysociation  . remote right radical nephrectomy  2009    Current Outpatient Prescriptions  Medication Sig Dispense Refill  . aspirin EC 81 MG tablet Take 81 mg by mouth daily before breakfast.    . Calcium Carbonate-Vitamin D (CALTRATE 600+D PO) Take 600 mg by mouth daily after breakfast.    . furosemide (LASIX) 20 MG tablet Take 20 mg by mouth daily with breakfast.     . letrozole (FEMARA) 2.5 MG tablet Take 1 tablet (2.5 mg total) by mouth daily. 30 tablet 0  . levothyroxine (SYNTHROID, LEVOTHROID) 50 MCG tablet Take 50 mcg by mouth daily before breakfast.     . meclizine (ANTIVERT) 12.5 MG tablet Take 1 tablet (12.5 mg total) by mouth 3 (three) times daily as needed for dizziness. 30 tablet 0  . midodrine (PROAMATINE) 2.5 MG tablet Take 2.5 mg by mouth daily after breakfast.     . OVER THE COUNTER MEDICATION Place 1 drop into both eyes daily  as needed. Over the counter lubricating eye drop     No current facility-administered medications for this visit.     Allergies:   Erythromycin; Tape; Codeine; Macrobid [nitrofurantoin macrocrystal]; and Tolterodine   Social History: Social History   Social History  . Marital status: Widowed    Spouse name: N/A  . Number of children: N/A  . Years of education: N/A   Occupational History  . Not on file.   Social History Main Topics  . Smoking status: Never Smoker  . Smokeless tobacco: Never Used  . Alcohol use No  . Drug use: No  .  Sexual activity: Not Currently   Other Topics Concern  . Not on file   Social History Narrative  . No narrative on file    Family History: Family History  Problem Relation Age of Onset  . Heart disease Maternal Grandmother   . Heart disease Mother      Review of Systems: All other systems reviewed and are otherwise negative except as noted above.   Physical Exam: VS:  BP 140/70   Pulse 87   Ht 5\' 7"  (1.702 m)   Wt 211 lb 4 oz (95.8 kg)   SpO2 95%   BMI 33.09 kg/m  , BMI Body mass index is 33.09 kg/m.  GEN- The patient is elderly and obese appearing, alert and oriented x 3 today.   HEENT: normocephalic, atraumatic; sclera clear, conjunctiva pink; hearing intact; oropharynx clear; neck supple  Lungs- Clear to ausculation bilaterally, normal work of breathing.  No wheezes, rales, rhonchi Heart- Regular rate and rhythm (paced)  GI- soft, non-tender, non-distended, bowel sounds present  Extremities- no clubbing, cyanosis, 1+ BLE edema  MS- no significant deformity or atrophy Skin- warm and dry, no rash or lesion; PPM pocket well healed Psych- euthymic mood, full affect Neuro- strength and sensation are intact  PPM Interrogation- reviewed in detail today,  See PACEART report  EKG:  EKG is not ordered today.  Recent Labs: 05/05/2016: ALT 15; TSH 3.612 07/29/2016: BUN 17; Creatinine, Ser 1.15; Hemoglobin 14.7; Platelets 224; Potassium 3.8; Sodium 144   Wt Readings from Last 3 Encounters:  07/29/16 211 lb 4 oz (95.8 kg)  07/29/16 204 lb (92.5 kg)  05/27/16 210 lb 6.4 oz (95.4 kg)     Other studies Reviewed: Additional studies/ records that were reviewed today include: Dr Martinique and Dr Jackalyn Lombard office notes  Assessment and Plan:  1.  Complete heart block Normal PPM function See Pace Art report No changes today Atrial lead unable to be placed at time of initial implant; pt declined reattempt at gen change  2.  LE edema Low sodium diet encouraged Continue  diuretic Seen by [facility] 2016 with no intervention recommended  3.  Psychosis She is clear that she does not wish to follow up with psych I have advised close follow up with PCP  No SI/HI today   4.  Orthostatic hypotension Stable No change required today  5.  Obesity Body mass index is 33.09 kg/m. Weight loss encouraged   Current medicines are reviewed at length with the patient today.   The patient does not have concerns regarding her medicines.  The following changes were made today:  none  Labs/ tests ordered today include: none No orders of the defined types were placed in this encounter.    Disposition:   Follow up with [facility], Dr Martinique as scheduled, Dr Rayann Heman 1 year    Signed, Chanetta Marshall, NP 07/29/2016 9:10  AM  [place] [address] [place] [address] [phone] (office) [phone] (fax)

## 2016-07-29 ENCOUNTER — Encounter (INDEPENDENT_AMBULATORY_CARE_PROVIDER_SITE_OTHER): Payer: Self-pay

## 2016-07-29 ENCOUNTER — Emergency Department (HOSPITAL_COMMUNITY)
Admission: EM | Admit: 2016-07-29 | Discharge: 2016-07-29 | Disposition: A | Discharge status: Home / Self Care | Payer: Medicare Other | Attending: Emergency Medicine | Admitting: Emergency Medicine

## 2016-07-29 ENCOUNTER — Encounter: Payer: Self-pay | Admitting: Nurse Practitioner

## 2016-07-29 ENCOUNTER — Encounter (HOSPITAL_COMMUNITY): Payer: Self-pay | Admitting: Emergency Medicine

## 2016-07-29 ENCOUNTER — Ambulatory Visit (INDEPENDENT_AMBULATORY_CARE_PROVIDER_SITE_OTHER): Payer: Medicare Other | Admitting: Nurse Practitioner

## 2016-07-29 VITALS — BP 140/70 | HR 87 | Ht 67.0 in | Wt 211.2 lb

## 2016-07-29 DIAGNOSIS — E039 Hypothyroidism, unspecified: Secondary | ICD-10-CM | POA: Diagnosis not present

## 2016-07-29 DIAGNOSIS — Z950 Presence of cardiac pacemaker: Secondary | ICD-10-CM | POA: Diagnosis not present

## 2016-07-29 DIAGNOSIS — I442 Atrioventricular block, complete: Secondary | ICD-10-CM | POA: Diagnosis not present

## 2016-07-29 DIAGNOSIS — R600 Localized edema: Secondary | ICD-10-CM

## 2016-07-29 DIAGNOSIS — R42 Dizziness and giddiness: Secondary | ICD-10-CM | POA: Diagnosis not present

## 2016-07-29 DIAGNOSIS — Z853 Personal history of malignant neoplasm of breast: Secondary | ICD-10-CM | POA: Diagnosis not present

## 2016-07-29 DIAGNOSIS — I951 Orthostatic hypotension: Secondary | ICD-10-CM

## 2016-07-29 DIAGNOSIS — R404 Transient alteration of awareness: Secondary | ICD-10-CM | POA: Diagnosis not present

## 2016-07-29 LAB — CBC WITH DIFFERENTIAL/PLATELET
BASOS ABS: 0 10*3/uL (ref 0.0–0.1)
BASOS PCT: 1 %
EOS ABS: 0.6 10*3/uL (ref 0.0–0.7)
EOS PCT: 8 %
HCT: 45.8 % (ref 36.0–46.0)
Hemoglobin: 14.7 g/dL (ref 12.0–15.0)
LYMPHS ABS: 1.1 10*3/uL (ref 0.7–4.0)
Lymphocytes Relative: 15 %
MCH: 30.8 pg (ref 26.0–34.0)
MCHC: 32.1 g/dL (ref 30.0–36.0)
MCV: 95.8 fL (ref 78.0–100.0)
Monocytes Absolute: 0.7 10*3/uL (ref 0.1–1.0)
Monocytes Relative: 10 %
NEUTROS PCT: 66 %
Neutro Abs: 4.9 10*3/uL (ref 1.7–7.7)
PLATELETS: 224 10*3/uL (ref 150–400)
RBC: 4.78 MIL/uL (ref 3.87–5.11)
RDW: 13.5 % (ref 11.5–15.5)
WBC: 7.3 10*3/uL (ref 4.0–10.5)

## 2016-07-29 LAB — BASIC METABOLIC PANEL
ANION GAP: 11 (ref 5–15)
BUN: 17 mg/dL (ref 6–20)
CALCIUM: 8.5 mg/dL — AB (ref 8.9–10.3)
CHLORIDE: 109 mmol/L (ref 101–111)
CO2: 24 mmol/L (ref 22–32)
CREATININE: 1.15 mg/dL — AB (ref 0.44–1.00)
GFR, EST AFRICAN AMERICAN: 48 mL/min — AB (ref >60)
GFR, EST NON AFRICAN AMERICAN: 42 mL/min — AB (ref >60)
Glucose, Bld: 92 mg/dL (ref 65–99)
POTASSIUM: 3.8 mmol/L (ref 3.5–5.1)
Sodium: 144 mmol/L (ref 135–145)

## 2016-07-29 LAB — CUP PACEART INCLINIC DEVICE CHECK
Date Time Interrogation Session: 20180402091454
Implantable Lead Implant Date: 20141208
Implantable Lead Location: 753860
Implantable Lead Model: 4076
MDC IDC PG IMPLANT DT: 20161027

## 2016-07-29 LAB — I-STAT TROPONIN, ED: TROPONIN I, POC: 0.02 ng/mL (ref 0.00–0.08)

## 2016-07-29 MED ORDER — MECLIZINE HCL 12.5 MG PO TABS: 12.5000 mg | ORAL_TABLET | Freq: Three times a day (TID) | ORAL | 0 refills | Status: DC | PRN

## 2016-07-29 MED ORDER — MECLIZINE HCL 25 MG PO TABS
12.5000 mg | ORAL_TABLET | Freq: Once | ORAL | Status: AC
  Administered 2016-07-29: 12.5 mg via ORAL
  Filled 2016-07-29: qty 1

## 2016-07-29 NOTE — ED Provider Notes (Signed)
Emergency Department Provider Note   I have reviewed the triage vital signs and the nursing notes.   HISTORY  Chief Complaint Anxiety   HPI [name] is a 83 y.o. female with history of complete heart block with pacemaker implant who presents to the ED for evaluation of vertiginous dizziness. This patient states that several hours ago she woke from sleep in her chair. She moved to stand from her seat and had sudden onset of vertiginous dizziness. Since arriving in triage, her complaints have improved. She denies any headache currently. No chest pain or dyspnea. She was previously medicated for vertigo, but her primary care physician retired and has not been on this therapy lately. Symptoms now resolved but worse with movement prior.    Past Medical History:  Diagnosis Date  . Cataract   . Complete heart block (HCC)    s/p PPM implant (MDT) by Dr Blanch Media.  Atrial lead could not be paced at time of the procedure.  She has chronic AV dysociation  . Delusional disorder ([place])   . Exogenous obesity   . Hypercholesterolemia   . Invasive ductal carcinoma of breast ([place]) 03/2007   BILATERAL BREASTS  . Orthostatic hypotension    treated with midodrine by Dr Rollene [name]  . PONV (postoperative nausea and vomiting)   . Thyroid disease   . Venous insufficiency     Patient Active Problem List   Diagnosis Date Noted  . Varicose veins of bilateral lower extremities with other complications 86/57/8469  . Venous insufficiency 08/25/2015  . Cellulitis 08/09/2015  . Pressure ulcer 08/09/2015  . Delusional disorder ([place]) 07/05/2015  . Bereavement reaction 07/05/2015  . Pacemaker at end of battery life 02/23/2015  . CHB (complete heart block) ([place])   . Acute GI bleeding 02/15/2015  . Acute kidney injury (nontraumatic) ([place]) 02/15/2015  . Hypothyroidism 02/15/2015  . Left upper quadrant pain   . Osteopenia 12/22/2013  . Postural dizziness 04/05/2013  . Complete heart block ([place])  02/26/2013  . S/P placement of cardiac pacemaker 02/26/2013  . Bilateral breast cancer ([place]) 03/25/2011    Past Surgical History:  Procedure Laterality Date  . ABDOMINAL HYSTERECTOMY    . BREAST LUMPECTOMY Bilateral 2009  . CATARACT EXTRACTION     EYE SURGERY X 2  . CHOLECYSTECTOMY    . COLONOSCOPY N/A 02/16/2015   Procedure: COLONOSCOPY;  Surgeon: Wilford Corner, MD;  Location: [facility] ENDOSCOPY;  Service: Endoscopy;  Laterality: N/A;  . EP IMPLANTABLE DEVICE N/A 02/23/2015   pacemaker generator change (MDT Sensia SR) by Dr Rayann Heman   . ESOPHAGOGASTRODUODENOSCOPY N/A 02/16/2015   Procedure: ESOPHAGOGASTRODUODENOSCOPY (EGD);  Surgeon: Wilford Corner, MD;  Location: [facility] ENDOSCOPY;  Service: Endoscopy;  Laterality: N/A;  . PACEMAKER INSERTION     MDT implanted by Dr Blanch Media.  Atrial lead placement was unsuccessful.  She has chronic AV dysociation  . remote right radical nephrectomy  2009    Current Outpatient Rx  . Order #: [id number] Class: Historical Med  . Order #: [id number] Class: Historical Med  . Order #: [id number] Class: Historical Med  . Order #: [id number] Class: Normal  . Order #: [id number] Class: Historical Med  . Order #: [id number] Class: Print  . Order #: [id number] Class: Historical Med  . Order #: [id number] Class: Historical Med    Allergies Erythromycin; Tape; Codeine; Macrobid [nitrofurantoin macrocrystal]; and Tolterodine  Family History  Problem Relation Age of Onset  . Heart disease Maternal Grandmother   . Heart disease Mother     Social History Social History  Substance Use Topics  . Smoking status: Never Smoker  . Smokeless tobacco: Never Used  . Alcohol use No    Review of Systems Constitutional: No fever/chills Eyes: No visual changes. ENT: No sore throat. Cardiovascular: Denies chest pain. Respiratory: Denies shortness of breath. Gastrointestinal: No abdominal pain.  No nausea, no vomiting.  No diarrhea.  No constipation. Genitourinary: Negative for  dysuria. Musculoskeletal: Negative for back pain. Skin: Negative for rash. Neurological: Negative for headaches, focal weakness or numbness. + vertiginous dizziness  10-point ROS otherwise negative.  ____________________________________________   PHYSICAL EXAM:  VITAL SIGNS: ED Triage Vitals  Enc Vitals Group     BP 07/29/16 0334 (!) 146/67     Pulse Rate 07/29/16 0334 64     Resp 07/29/16 0334 18     Temp 07/29/16 0334 97.6 F (36.4 C)     Temp Source 07/29/16 0334 Oral     SpO2 07/29/16 0334 97 %     Weight 07/29/16 0335 204 lb (92.5 kg)     Height 07/29/16 0335 5' 7.5" (1.715 m)   Constitutional: Alert and oriented. Well appearing and in no acute distress. Eyes: Conjunctivae are normal. PERRL. EOMI. Head: Atraumatic. Nose: No congestion/rhinnorhea. Mouth/Throat: Mucous membranes are moist.  Neck: No stridor.  Cardiovascular: Normal rate, regular rhythm. Good peripheral circulation. Grossly normal heart sounds.   Respiratory: Normal respiratory effort.  No retractions. Lungs CTAB. Gastrointestinal: Soft and nontender. No distention.  Musculoskeletal: No lower extremity tenderness nor edema. No gross deformities of extremities. Neurologic:  Normal speech and language. No gross focal neurologic deficits are appreciated. No pronator drift. Normal finger-to-nose testing. Normal gait.  Skin:  Skin is warm, dry and intact. No rash noted. Psychiatric: Mood and affect are normal. Speech and behavior are normal.  ____________________________________________   LABS (all labs ordered are listed, but only abnormal results are displayed)  Labs Reviewed  BASIC METABOLIC PANEL - Abnormal; Notable for the following:       Result Value   Creatinine, Ser 1.15 (*)    Calcium 8.5 (*)    GFR calc non Af Amer 42 (*)    GFR calc Af Amer 48 (*)    All other components within normal limits  CBC WITH DIFFERENTIAL/PLATELET  I-STAT TROPOININ, ED    ____________________________________________  EKG   EKG Interpretation  Date/Time:  Monday July 29 2016 04:54:13 EDT Ventricular Rate:  63 PR Interval:    QRS Duration: 166 QT Interval:  498 QTC Calculation: 510 R Axis:   -83 Text Interpretation:  Ventricular-paced rhythm No further analysis attempted due to paced rhythm Similar to January 2018 tracing.  Confirmed by Brenlyn Beshara MD, Karie Skowron [phone]) on 07/29/2016 5:04:32 AM Also confirmed by Bettymae Yott MD, Yi Falletta [phone]), editor [place]  CCT, [place] ([id number])  on 07/29/2016 6:54:47 AM       ____________________________________________  RADIOLOGY  None ____________________________________________   PROCEDURES  Procedure(s) performed:   Procedures  None ____________________________________________   INITIAL IMPRESSION / ASSESSMENT AND PLAN / ED COURSE  Pertinent labs & imaging results that were available during my care of the patient were reviewed by me and considered in my medical decision making (see chart for details).  Patient with history of vertigo presents to the emergency department with return of vertigo symptoms. She states that she was previously on a medication that started with "m" but cannot recall the exact name. She states her primary care physician is no longer practicing. No symptoms at the time of my exam. The patient has  no focal neurological deficits to suggest a central cause of vertigo. With her lightheadedness weakness I plan to obtain an EKG along with baseline lab work. Very low suspicion for anemia, electrolyte abnormality, troponin elevation. Plan for reassessment after labs.   05:13 AM Updated patient who is symptom-free. History of vertigo. No exam findings or historical clues to increase suspicion for central vertgio. Will discharge with low-dose meclizine to take PRN. She will follow with PCP to discuss medication refill.   At this time, I do not feel there is any life-threatening condition present. I  have reviewed and discussed all results (EKG, imaging, lab, urine as appropriate), exam findings with patient. I have reviewed nursing notes and appropriate previous records.  I feel the patient is safe to be discharged home without further emergent workup. Discussed usual and customary return precautions. Patient and family (if present) verbalize understanding and are comfortable with this plan.  Patient will follow-up with their primary care provider. If they do not have a primary care provider, information for follow-up has been provided to them. All questions have been answered.  ____________________________________________  FINAL CLINICAL IMPRESSION(S) / ED DIAGNOSES  Final diagnoses:  Vertigo  Episodic lightheadedness     MEDICATIONS GIVEN DURING THIS VISIT:  Medications  meclizine (ANTIVERT) tablet 12.5 mg (12.5 mg Oral Given 07/29/16 0646)     NEW OUTPATIENT MEDICATIONS STARTED DURING THIS VISIT:  Discharge Medication List as of 07/29/2016  5:34 AM    START taking these medications   Details  meclizine (ANTIVERT) 12.5 MG tablet Take 1 tablet (12.5 mg total) by mouth 3 (three) times daily as needed for dizziness., Starting Mon 07/29/2016, Print         Note:  This document was prepared using Dragon voice recognition software and may include unintentional dictation errors.  Nanda Quinton, MD Emergency Medicine   Margette Fast, MD 07/29/16 [phone]

## 2016-07-29 NOTE — Patient Instructions (Signed)
Medication Instructions:  None Ordered   Labwork: None Ordered   Testing/Procedures: None Ordered   Follow-Up: Remote monitoring is used to monitor your Pacemaker from home. This monitoring reduces the number of office visits required to check your device to one time per year. It allows us to keep an eye on the functioning of your device to ensure it is working properly. You are scheduled for a device check from home on 10/28/2016. You may send your transmission at any time that day. If you have a wireless device, the transmission will be sent automatically. After your physician reviews your transmission, you will receive a postcard with your next transmission date.  Your physician wants you to follow-up in: 1 year with Dr. Allred You will receive a reminder letter in the mail two months in advance. If you don't receive a letter, please call our office to schedule the follow-up appointment.   Any Other Special Instructions Will Be Listed Below (If Applicable).     If you need a refill on your cardiac medications before your next appointment, please call your pharmacy.   

## 2016-07-29 NOTE — ED Triage Notes (Signed)
Pt from home. Per EMS, around 2am this morning pt woke up in her chair, when she raised her head up she had a sense of vertigo. Pt has hx of vertigo. She is prescribed a pill for vertigo but the MD who prescribed to her is no longer in practice so pt has been self adjusting doses. Pt did not take one yesterday. Pt also has a [employer] pacemaker, she has an appointment today. Pt reports she is concerned and nervous about that as well which is causing her anxiety. EMS VS 130/80, 82P, 96%, RR16.

## 2016-07-29 NOTE — Discharge Instructions (Signed)
We believe your symptoms were caused by benign vertigo.  Please read through the included information and take any prescribed medication(s).  Follow up with your doctor as listed above.  If you develop any new or worsening symptoms that concern you, including but not limited to persistent dizziness/vertigo, numbness or weakness in your arms or legs, altered mental status, persistent vomiting, or fever greater than 101, please return immediately to the Emergency Department.  

## 2016-07-31 ENCOUNTER — Other Ambulatory Visit: Payer: Self-pay

## 2016-07-31 ENCOUNTER — Ambulatory Visit: Payer: Self-pay | Admitting: Hematology and Oncology

## 2016-08-06 ENCOUNTER — Telehealth: Payer: Self-pay | Admitting: Hematology and Oncology

## 2016-08-06 NOTE — Telephone Encounter (Signed)
sw pt to confirm r/s appt to 5/24 at 10 am per LOS

## 2016-08-07 DIAGNOSIS — H401132 Primary open-angle glaucoma, bilateral, moderate stage: Secondary | ICD-10-CM | POA: Diagnosis not present

## 2016-08-07 DIAGNOSIS — H43811 Vitreous degeneration, right eye: Secondary | ICD-10-CM | POA: Diagnosis not present

## 2016-08-07 DIAGNOSIS — Z961 Presence of intraocular lens: Secondary | ICD-10-CM | POA: Diagnosis not present

## 2016-08-07 DIAGNOSIS — H40013 Open angle with borderline findings, low risk, bilateral: Secondary | ICD-10-CM | POA: Diagnosis not present

## 2016-08-14 ENCOUNTER — Ambulatory Visit: Payer: Self-pay | Admitting: Hematology and Oncology

## 2016-08-14 ENCOUNTER — Other Ambulatory Visit: Payer: Self-pay

## 2016-08-22 DIAGNOSIS — R109 Unspecified abdominal pain: Secondary | ICD-10-CM | POA: Diagnosis not present

## 2016-08-30 DIAGNOSIS — N281 Cyst of kidney, acquired: Secondary | ICD-10-CM | POA: Diagnosis not present

## 2016-08-30 DIAGNOSIS — R109 Unspecified abdominal pain: Secondary | ICD-10-CM | POA: Diagnosis not present

## 2016-09-18 ENCOUNTER — Other Ambulatory Visit: Payer: Self-pay

## 2016-09-18 ENCOUNTER — Ambulatory Visit: Payer: Self-pay | Admitting: Hematology and Oncology

## 2016-09-18 DIAGNOSIS — C50111 Malignant neoplasm of central portion of right female breast: Secondary | ICD-10-CM

## 2016-09-18 DIAGNOSIS — C50112 Malignant neoplasm of central portion of left female breast: Principal | ICD-10-CM

## 2016-09-18 NOTE — Assessment & Plan Note (Addendum)
Bilateral breast cancers status post bilateral lumpectomies followed by bilateral radiation treatments to the breast and currently on Femara 2.5 mg by mouth daily since 2009. Patient has been tolerating Femara extremely well without any major problems. She said today walking with a walker. She has not had a mammogram since 2013 but she would like to start back on mammograms again.  We discussed whether discontinuation of Femara is an option but the patient was reluctant to stop it and wanted to continue it Indefinitely.  Breast cancer surveillance: 1. Mammograms 05/11/2014 normal category B breast density, patient did not want to undergo any further mammograms because she did not wish to undergo any surgical interventions even if she did have a disease recurrence. 2. Bone density showed osteopenia T score -2.2 in 2013 3. breast exam 09/18/16 is normal  Renal cell cancer: Status post nephrectomy Delusions  RTC in 1 year

## 2016-09-19 ENCOUNTER — Ambulatory Visit (HOSPITAL_BASED_OUTPATIENT_CLINIC_OR_DEPARTMENT_OTHER): Payer: Medicare Other | Admitting: Hematology and Oncology

## 2016-09-19 ENCOUNTER — Other Ambulatory Visit (HOSPITAL_BASED_OUTPATIENT_CLINIC_OR_DEPARTMENT_OTHER): Payer: Medicare Other

## 2016-09-19 ENCOUNTER — Encounter: Payer: Self-pay | Admitting: Hematology and Oncology

## 2016-09-19 DIAGNOSIS — C649 Malignant neoplasm of unspecified kidney, except renal pelvis: Secondary | ICD-10-CM | POA: Diagnosis not present

## 2016-09-19 DIAGNOSIS — C50112 Malignant neoplasm of central portion of left female breast: Principal | ICD-10-CM

## 2016-09-19 DIAGNOSIS — C50311 Malignant neoplasm of lower-inner quadrant of right female breast: Secondary | ICD-10-CM

## 2016-09-19 DIAGNOSIS — C50912 Malignant neoplasm of unspecified site of left female breast: Secondary | ICD-10-CM

## 2016-09-19 DIAGNOSIS — C50111 Malignant neoplasm of central portion of right female breast: Secondary | ICD-10-CM

## 2016-09-19 LAB — CBC WITH DIFFERENTIAL/PLATELET
BASO%: 0.8 % (ref 0.0–2.0)
Basophils Absolute: 0.1 10*3/uL (ref 0.0–0.1)
EOS%: 7.5 % — AB (ref 0.0–7.0)
Eosinophils Absolute: 0.5 10*3/uL (ref 0.0–0.5)
HEMATOCRIT: 45.5 % (ref 34.8–46.6)
HGB: 15.3 g/dL (ref 11.6–15.9)
LYMPH#: 1.1 10*3/uL (ref 0.9–3.3)
LYMPH%: 17.5 % (ref 14.0–49.7)
MCH: 31.4 pg (ref 25.1–34.0)
MCHC: 33.5 g/dL (ref 31.5–36.0)
MCV: 93.6 fL (ref 79.5–101.0)
MONO#: 0.6 10*3/uL (ref 0.1–0.9)
MONO%: 9.1 % (ref 0.0–14.0)
NEUT%: 65.1 % (ref 38.4–76.8)
NEUTROS ABS: 4.2 10*3/uL (ref 1.5–6.5)
PLATELETS: 226 10*3/uL (ref 145–400)
RBC: 4.87 10*6/uL (ref 3.70–5.45)
RDW: 12.7 % (ref 11.2–14.5)
WBC: 6.5 10*3/uL (ref 3.9–10.3)

## 2016-09-19 LAB — COMPREHENSIVE METABOLIC PANEL
ALK PHOS: 110 U/L (ref 40–150)
ALT: 12 U/L (ref 0–55)
ANION GAP: 11 meq/L (ref 3–11)
AST: 13 U/L (ref 5–34)
Albumin: 3.6 g/dL (ref 3.5–5.0)
BUN: 18.2 mg/dL (ref 7.0–26.0)
CALCIUM: 10 mg/dL (ref 8.4–10.4)
CHLORIDE: 105 meq/L (ref 98–109)
CO2: 27 meq/L (ref 22–29)
Creatinine: 1.2 mg/dL — ABNORMAL HIGH (ref 0.6–1.1)
EGFR: 43 mL/min/{1.73_m2} — ABNORMAL LOW (ref >90)
Glucose: 85 mg/dl (ref 70–140)
Potassium: 3.5 mEq/L (ref 3.5–5.1)
Sodium: 143 mEq/L (ref 136–145)
TOTAL PROTEIN: 7.2 g/dL (ref 6.4–8.3)
Total Bilirubin: 0.41 mg/dL (ref 0.20–1.20)

## 2016-09-19 MED ORDER — LETROZOLE 2.5 MG PO TABS: 2.5000 mg | ORAL_TABLET | Freq: Every day | ORAL | 3 refills | Status: DC

## 2016-09-19 NOTE — Progress Notes (Signed)
Patient Care Team: [name], MD as PCP - General (Internal Medicine) Thompson Grayer, MD as Referring Physician (Cardiology)  DIAGNOSIS:  Encounter Diagnosis  Name Primary?  . Bilateral malignant neoplasm of central portion of breast in female, unspecified estrogen receptor status ([place])     SUMMARY OF ONCOLOGIC HISTORY:   Bilateral breast cancer ([place])   04/21/2007 Breast MRI    Right breast lower inner quadrant enhancement 8.9 x 4.2 x 4 cm. Biopsy-proven invasive ductal carcinoma Left breast 2 discrete irregular nodules 12 x 8 x 9 mm 11 x 11 x 8 mm portal 3 cm. Biopsy-proven invasive ductal carcinoma      05/05/2007 Surgery    Bilateral lumpectomies. Left breast multifocal invasive ductal carcinoma with DCIS 1 sentinel node negative. Right breast multifocal invasive ductal carcinoma 1.4 and 0.7 cm, 3 sentinel lymph nodes -1 showed isolated tumor cells      09/09/2007 Procedure    Renal cell cancer status post nephrectomy      09/11/2007 -  Anti-estrogen oral therapy    Femara 2.5 mg once daily      03/25/2011 Initial Diagnosis    Breast cancer       - 09/10/2007 Radiation Therapy    Radiation therapy to both breasts the lumpectomy sites      02/14/2015 - 02/17/2015 Hospital Admission     acute GI bleeding : EGD normal, colonoscopy left-sided diverticulosis suspect diverticular bleed.       CHIEF COMPLIANT: Continues to remain on letrozole therapy  INTERVAL HISTORY: CANDE MASTROPIETRO is a 83 year old elderly lady with the above history of breast cancer who is currently on famotidine does not want to stop the therapy even after multiple attempts to try to discontinue it. 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 She denies any hot flashes or myalgias. She denies any lumps or nodules in the breasts.   REVIEW OF SYSTEMS:   Constitutional: Denies fevers, chills or abnormal weight loss Eyes: Denies blurriness of  vision Ears, nose, mouth, throat, and face: Denies mucositis or sore throat Respiratory: Denies cough, dyspnea or wheezes Cardiovascular: Denies palpitation, chest discomfort Gastrointestinal:  Denies nausea, heartburn or change in bowel habits Skin: Denies abnormal skin rashes Lymphatics: Denies new lymphadenopathy or easy bruising Neurological:Denies numbness, tingling or new weaknesses Behavioral/Psych: Mood is stable, no new changes  Extremities: No lower extremity edema Breast:  denies any pain or lumps or nodules in either breasts All other systems were reviewed with the patient and are negative.  I have reviewed the past medical history, past surgical history, social history and family history with the patient and they are unchanged from previous note.  ALLERGIES:  is allergic to erythromycin; tape; codeine; macrobid [nitrofurantoin macrocrystal]; and tolterodine.  MEDICATIONS:  Current Outpatient Prescriptions  Medication Sig Dispense Refill  . aspirin EC 81 MG tablet Take 81 mg by mouth daily before breakfast.    . Calcium Carbonate-Vitamin D (CALTRATE 600+D PO) Take 600 mg by mouth daily after breakfast.    . furosemide (LASIX) 20 MG tablet Take 20 mg by mouth daily with breakfast.     . letrozole (FEMARA) 2.5 MG tablet Take 1 tablet (2.5 mg total) by mouth daily. 90 tablet 3  . levothyroxine (SYNTHROID, LEVOTHROID) 50 MCG tablet Take 50 mcg by mouth daily before breakfast.     . meclizine (ANTIVERT) 12.5 MG tablet Take 1 tablet (12.5 mg total) by mouth 3 (three) times daily as  needed for dizziness. 30 tablet 0  . midodrine (PROAMATINE) 2.5 MG tablet Take 2.5 mg by mouth daily after breakfast.     . OVER THE COUNTER MEDICATION Place 1 drop into both eyes daily as needed. Over the counter lubricating eye drop     No current facility-administered medications for this visit.     PHYSICAL EXAMINATION: ECOG PERFORMANCE STATUS: 1 - Symptomatic but completely ambulatory  Vitals:    09/19/16 1052  BP: 102/70  Pulse: 83  Resp: 18   Filed Weights   09/19/16 1052  Weight: 207 lb (93.9 kg)    GENERAL:alert, no distress and comfortable SKIN: skin color, texture, turgor are normal, no rashes or significant lesions EYES: normal, Conjunctiva are pink and non-injected, sclera clear OROPHARYNX:no exudate, no erythema and lips, buccal mucosa, and tongue normal  NECK: supple, thyroid normal size, non-tender, without nodularity LYMPH:  no palpable lymphadenopathy in the cervical, axillary or inguinal LUNGS: clear to auscultation and percussion with normal breathing effort HEART: regular rate & rhythm and no murmurs and no lower extremity edema ABDOMEN:abdomen soft, non-tender and normal bowel sounds MUSCULOSKELETAL:no cyanosis of digits and no clubbing  NEURO: alert & oriented x 3 with fluent speech, no focal motor/sensory deficits EXTREMITIES: No lower extremity edema  LABORATORY DATA:  I have reviewed the data as listed   Chemistry      Component Value Date/Time   NA 143 09/19/2016 1002   K 3.5 09/19/2016 1002   CL 109 07/29/2016 0407   CL 106 03/03/2012 0831   CO2 27 09/19/2016 1002   BUN 18.2 09/19/2016 1002   CREATININE 1.2 (H) 09/19/2016 1002      Component Value Date/Time   CALCIUM 10.0 09/19/2016 1002   ALKPHOS 110 09/19/2016 1002   AST 13 09/19/2016 1002   ALT 12 09/19/2016 1002   BILITOT 0.41 09/19/2016 1002       Lab Results  Component Value Date   WBC 6.5 09/19/2016   HGB 15.3 09/19/2016   HCT 45.5 09/19/2016   MCV 93.6 09/19/2016   PLT 226 09/19/2016   NEUTROABS 4.2 09/19/2016    ASSESSMENT & PLAN:  Bilateral breast cancer (HCC) Bilateral breast cancers status post bilateral lumpectomies followed by bilateral radiation treatments to the breast and currently on Femara 2.5 mg by mouth daily since 2009. Patient has been tolerating Femara extremely well without any major problems. She has not had a mammogram since 2013. We discussed  whether discontinuation of Femara is an option but the patient was reluctant to stop it and wanted to continue it Indefinitely.  Letrozole toxicities: She is to over she is was this is a patient today she really helped flushing were displayed  Breast cancer surveillance: 1. Mammograms 05/11/2014 normal category B breast density, patient did not want to undergo any further mammograms because she did not wish to undergo any surgical interventions even if she did have a disease recurrence. 2. Bone density showed osteopenia T score -2.2 in 2013 3. breast exam 09/18/16 is normal  Renal cell cancer: Status post nephrectomy Patient had a lengthy conversation with me about her family situation. Apparently they're living will dictate that her inheritance should go to creatures. The family is not happy about it. She does not have any children. It is stress coming from her brothers and other family members.  RTC in 1 year   I spent 25 minutes talking to the patient of which more than half was spent in counseling and coordination of care.  No orders of the defined types were placed in this encounter.  The patient has a good understanding of the overall plan. she agrees with it. she will call with any problems that may develop before the next visit here.   Rulon Eisenmenger, MD 09/19/16

## 2016-10-07 DIAGNOSIS — E039 Hypothyroidism, unspecified: Secondary | ICD-10-CM | POA: Diagnosis not present

## 2016-10-07 DIAGNOSIS — L84 Corns and callosities: Secondary | ICD-10-CM | POA: Diagnosis not present

## 2016-10-07 DIAGNOSIS — M8580 Other specified disorders of bone density and structure, unspecified site: Secondary | ICD-10-CM | POA: Diagnosis not present

## 2016-10-07 DIAGNOSIS — R197 Diarrhea, unspecified: Secondary | ICD-10-CM | POA: Diagnosis not present

## 2016-10-13 ENCOUNTER — Emergency Department (HOSPITAL_COMMUNITY): Payer: Medicare Other

## 2016-10-13 ENCOUNTER — Emergency Department (HOSPITAL_COMMUNITY)
Admission: EM | Admit: 2016-10-13 | Discharge: 2016-10-14 | Disposition: A | Discharge status: Home / Self Care | Payer: Medicare Other | Source: Home / Self Care | Attending: Emergency Medicine | Admitting: Emergency Medicine

## 2016-10-13 ENCOUNTER — Encounter (HOSPITAL_COMMUNITY): Payer: Self-pay

## 2016-10-13 DIAGNOSIS — E785 Hyperlipidemia, unspecified: Secondary | ICD-10-CM | POA: Diagnosis not present

## 2016-10-13 DIAGNOSIS — R197 Diarrhea, unspecified: Secondary | ICD-10-CM | POA: Diagnosis not present

## 2016-10-13 DIAGNOSIS — Z79899 Other long term (current) drug therapy: Secondary | ICD-10-CM

## 2016-10-13 DIAGNOSIS — Z7982 Long term (current) use of aspirin: Secondary | ICD-10-CM | POA: Insufficient documentation

## 2016-10-13 DIAGNOSIS — R109 Unspecified abdominal pain: Secondary | ICD-10-CM

## 2016-10-13 DIAGNOSIS — K5732 Diverticulitis of large intestine without perforation or abscess without bleeding: Secondary | ICD-10-CM | POA: Diagnosis not present

## 2016-10-13 DIAGNOSIS — Z950 Presence of cardiac pacemaker: Secondary | ICD-10-CM | POA: Insufficient documentation

## 2016-10-13 DIAGNOSIS — K5730 Diverticulosis of large intestine without perforation or abscess without bleeding: Secondary | ICD-10-CM

## 2016-10-13 DIAGNOSIS — I959 Hypotension, unspecified: Secondary | ICD-10-CM | POA: Diagnosis not present

## 2016-10-13 DIAGNOSIS — I5032 Chronic diastolic (congestive) heart failure: Secondary | ICD-10-CM | POA: Diagnosis not present

## 2016-10-13 DIAGNOSIS — Z853 Personal history of malignant neoplasm of breast: Secondary | ICD-10-CM | POA: Diagnosis not present

## 2016-10-13 DIAGNOSIS — K5792 Diverticulitis of intestine, part unspecified, without perforation or abscess without bleeding: Secondary | ICD-10-CM | POA: Diagnosis not present

## 2016-10-13 LAB — COMPREHENSIVE METABOLIC PANEL
ALT: 12 U/L — ABNORMAL LOW (ref 14–54)
AST: 15 U/L (ref 15–41)
Albumin: 3.5 g/dL (ref 3.5–5.0)
Alkaline Phosphatase: 100 U/L (ref 38–126)
Anion gap: 9 (ref 5–15)
BUN: 18 mg/dL (ref 6–20)
CHLORIDE: 104 mmol/L (ref 101–111)
CO2: 27 mmol/L (ref 22–32)
Calcium: 9 mg/dL (ref 8.9–10.3)
Creatinine, Ser: 1.52 mg/dL — ABNORMAL HIGH (ref 0.44–1.00)
GFR calc Af Amer: 35 mL/min — ABNORMAL LOW (ref >60)
GFR, EST NON AFRICAN AMERICAN: 30 mL/min — AB (ref >60)
Glucose, Bld: 96 mg/dL (ref 65–99)
POTASSIUM: 3.3 mmol/L — AB (ref 3.5–5.1)
Sodium: 140 mmol/L (ref 135–145)
Total Bilirubin: 0.8 mg/dL (ref 0.3–1.2)
Total Protein: 6.7 g/dL (ref 6.5–8.1)

## 2016-10-13 LAB — CBC WITH DIFFERENTIAL/PLATELET
BASOS ABS: 0 10*3/uL (ref 0.0–0.1)
BASOS PCT: 1 %
EOS PCT: 4 %
Eosinophils Absolute: 0.4 10*3/uL (ref 0.0–0.7)
HCT: 45.4 % (ref 36.0–46.0)
Hemoglobin: 14.9 g/dL (ref 12.0–15.0)
Lymphocytes Relative: 16 %
Lymphs Abs: 1.4 10*3/uL (ref 0.7–4.0)
MCH: 31.3 pg (ref 26.0–34.0)
MCHC: 32.8 g/dL (ref 30.0–36.0)
MCV: 95.4 fL (ref 78.0–100.0)
MONO ABS: 1 10*3/uL (ref 0.1–1.0)
Monocytes Relative: 11 %
Neutro Abs: 5.9 10*3/uL (ref 1.7–7.7)
Neutrophils Relative %: 68 %
PLATELETS: 217 10*3/uL (ref 150–400)
RBC: 4.76 MIL/uL (ref 3.87–5.11)
RDW: 12.8 % (ref 11.5–15.5)
WBC: 8.8 10*3/uL (ref 4.0–10.5)

## 2016-10-13 MED ORDER — CIPROFLOXACIN IN D5W 400 MG/200ML IV SOLN
400.0000 mg | Freq: Once | INTRAVENOUS | Status: AC
  Administered 2016-10-13: 400 mg via INTRAVENOUS
  Filled 2016-10-13: qty 200

## 2016-10-13 MED ORDER — CIPROFLOXACIN HCL 500 MG PO TABS: 500.0000 mg | ORAL_TABLET | Freq: Two times a day (BID) | ORAL | 0 refills | Status: DC

## 2016-10-13 MED ORDER — IOPAMIDOL (ISOVUE-300) INJECTION 61%
INTRAVENOUS | Status: AC
  Filled 2016-10-13: qty 30

## 2016-10-13 MED ORDER — SODIUM CHLORIDE 0.9 % IV BOLUS (SEPSIS)
500.0000 mL | Freq: Once | INTRAVENOUS | Status: AC
  Administered 2016-10-13: 500 mL via INTRAVENOUS

## 2016-10-13 MED ORDER — METRONIDAZOLE 500 MG PO TABS: 500.0000 mg | ORAL_TABLET | Freq: Three times a day (TID) | ORAL | 0 refills | Status: DC

## 2016-10-13 MED ORDER — METRONIDAZOLE IN NACL 5-0.79 MG/ML-% IV SOLN
500.0000 mg | Freq: Once | INTRAVENOUS | Status: AC
  Administered 2016-10-13: 500 mg via INTRAVENOUS
  Filled 2016-10-13: qty 100

## 2016-10-13 MED ORDER — ONDANSETRON 4 MG PO TBDP: ORAL_TABLET | ORAL | 0 refills | Status: DC

## 2016-10-13 NOTE — Discharge Instructions (Signed)
Follow up with your md this week for recheck  °

## 2016-10-13 NOTE — ED Notes (Signed)
IV team staff at bedside at this time starting IV.

## 2016-10-13 NOTE — ED Triage Notes (Signed)
To triage via EMS.  Pt has had diarrhea off and on x 3 years, worse in past month.  Pt seen PCP 10-07-16 and was prescribed Viberza, pt took first dose yesterday, then read package insert and it said not to take if you have no gallbladder.  Pt reports every time she eats she has diarrhea.

## 2016-10-13 NOTE — ED Provider Notes (Signed)
[facility] DEPT Provider Note   CSN: [id number] Arrival date & time: 10/13/16  1342     History   Chief Complaint Chief Complaint  Patient presents with  . Diarrhea    HPI KAMARA ALLAN is a 83 y.o. female.  Patient complains of diarrhea for 2 weeks. And some nausea minimal discomfort in her abdomen   The history is provided by the patient. No language interpreter was used.  Diarrhea   This is a new problem. The current episode started more than 1 week ago. The problem occurs 2 to 4 times per day. The problem has not changed since onset.The stool consistency is described as watery. There has been no fever. Pertinent negatives include no abdominal pain, no sweats, no headaches and no cough. She has tried nothing for the symptoms.    Past Medical History:  Diagnosis Date  . Cataract   . Complete heart block (HCC)    s/p PPM implant (MDT) by Dr Blanch Media.  Atrial lead could not be paced at time of the procedure.  She has chronic AV dysociation  . Delusional disorder ([place])   . Exogenous obesity   . Hypercholesterolemia   . Invasive ductal carcinoma of breast ([place]) 03/2007   BILATERAL BREASTS  . Orthostatic hypotension    treated with midodrine by Dr Rollene [name]  . PONV (postoperative nausea and vomiting)   . Thyroid disease   . Venous insufficiency     Patient Active Problem List   Diagnosis Date Noted  . Varicose veins of bilateral lower extremities with other complications 10/20/7626  . Venous insufficiency 08/25/2015  . Cellulitis 08/09/2015  . Pressure ulcer 08/09/2015  . Delusional disorder ([place]) 07/05/2015  . Bereavement reaction 07/05/2015  . Pacemaker at end of battery life 02/23/2015  . CHB (complete heart block) ([place])   . Acute GI bleeding 02/15/2015  . Acute kidney injury (nontraumatic) ([place]) 02/15/2015  . Hypothyroidism 02/15/2015  . Left upper quadrant pain   . Osteopenia 12/22/2013  . Postural dizziness 04/05/2013  . Complete heart block ([place])  02/26/2013  . S/P placement of cardiac pacemaker 02/26/2013  . Bilateral breast cancer ([place]) 03/25/2011    Past Surgical History:  Procedure Laterality Date  . ABDOMINAL HYSTERECTOMY    . BREAST LUMPECTOMY Bilateral 2009  . CATARACT EXTRACTION     EYE SURGERY X 2  . CHOLECYSTECTOMY    . COLONOSCOPY N/A 02/16/2015   Procedure: COLONOSCOPY;  Surgeon: Wilford Corner, MD;  Location: [facility] ENDOSCOPY;  Service: Endoscopy;  Laterality: N/A;  . EP IMPLANTABLE DEVICE N/A 02/23/2015   pacemaker generator change (MDT Sensia SR) by Dr Rayann Heman   . ESOPHAGOGASTRODUODENOSCOPY N/A 02/16/2015   Procedure: ESOPHAGOGASTRODUODENOSCOPY (EGD);  Surgeon: Wilford Corner, MD;  Location[facility] ENDOSCOPY;  Service: Endoscopy;  Laterality: N/A;  . PACEMAKER INSERTION     MDT implanted by Dr Blanch Media.  Atrial lead placement was unsuccessful.  She has chronic AV dysociation  . remote right radical nephrectomy  2009    OB History    No data available       Home Medications    Prior to Admission medications   Medication Sig Start Date End Date Taking? Authorizing Provider  aspirin EC 81 MG tablet Take 81 mg by mouth daily before breakfast.   Yes [provider]  Calcium Carbonate-Vitamin D (CALTRATE 600+D PO) Take 600 mg by mouth daily after breakfast.   Yes [provider]  furosemide (LASIX) 20 MG tablet Take 20 mg by mouth daily with  breakfast.    Yes [provider]  letrozole (FEMARA) 2.5 MG tablet Take 1 tablet (2.5 mg total) by mouth daily. Patient taking differently: Take 2.5 mg by mouth daily with breakfast.  09/19/16  Yes [name], MD  levothyroxine (SYNTHROID, LEVOTHROID) 50 MCG tablet Take 50 mcg by mouth daily before breakfast.    Yes [provider]  meclizine (ANTIVERT) 12.5 MG tablet Take 1 tablet (12.5 mg total) by mouth 3 (three) times daily as needed for dizziness. 07/29/16  Yes Long, Wonda Olds, MD  midodrine (PROAMATINE) 2.5 MG tablet Take 2.5 mg by mouth  daily after breakfast.    Yes [provider]  OVER THE COUNTER MEDICATION Place 1 drop into both eyes daily as needed (dry eyes). Over the counter lubricating eye drop    Yes [provider]  Probiotic Product (PROBIOTIC PO) Take 1 tablet by mouth daily with breakfast.   Yes [provider]    Family History Family History  Problem Relation Age of Onset  . Heart disease Maternal Grandmother   . Heart disease Mother     Social History Social History  Substance Use Topics  . Smoking status: Never Smoker  . Smokeless tobacco: Never Used  . Alcohol use No     Allergies   Erythromycin; Tape; Codeine; Macrobid [nitrofurantoin macrocrystal]; and Tolterodine   Review of Systems Review of Systems  Constitutional: Negative for appetite change and fatigue.  HENT: Negative for congestion, ear discharge and sinus pressure.   Eyes: Negative for discharge.  Respiratory: Negative for cough.   Cardiovascular: Negative for chest pain.  Gastrointestinal: Positive for diarrhea. Negative for abdominal pain.  Genitourinary: Negative for frequency and hematuria.  Musculoskeletal: Negative for back pain.  Skin: Negative for rash.  Neurological: Negative for seizures and headaches.  Psychiatric/Behavioral: Negative for hallucinations.     Physical Exam Updated Vital Signs BP 103/67 (BP Location: Right Arm)   Pulse 70   Temp 98.4 F (36.9 C)   Resp 18   Ht 5' 7.5" (1.715 m)   Wt 90.9 kg (200 lb 8 oz)   SpO2 93%   BMI 30.94 kg/m   Physical Exam  Constitutional: She is oriented to person, place, and time. She appears well-developed.  HENT:  Head: Normocephalic.  Eyes: Conjunctivae and EOM are normal. No scleral icterus.  Neck: Neck supple. No thyromegaly present.  Cardiovascular: Normal rate and regular rhythm.  Exam reveals no gallop and no friction rub.   No murmur heard. Pulmonary/Chest: No stridor. She has no wheezes. She has no rales. She exhibits no  tenderness.  Abdominal: She exhibits no distension. There is tenderness. There is no rebound.  Minimal left lower quadrant tenderness  Musculoskeletal: Normal range of motion. She exhibits no edema.  Lymphadenopathy:    She has no cervical adenopathy.  Neurological: She is oriented to person, place, and time. She exhibits normal muscle tone. Coordination normal.  Skin: No rash noted. No erythema.  Psychiatric: She has a normal mood and affect. Her behavior is normal.     ED Treatments / Results  Labs (all labs ordered are listed, but only abnormal results are displayed) Labs Reviewed  COMPREHENSIVE METABOLIC PANEL - Abnormal; Notable for the following:       Result Value   Potassium 3.3 (*)    Creatinine, Ser 1.52 (*)    ALT 12 (*)    GFR calc non Af Amer 30 (*)    GFR calc Af Amer 35 (*)  All other components within normal limits  CBC WITH DIFFERENTIAL/PLATELET    EKG  EKG Interpretation None       Radiology Ct Abdomen Pelvis Wo Contrast  Result Date: 10/13/2016 CLINICAL DATA:  Abdominal pain and diarrhea. EXAM: CT ABDOMEN AND PELVIS WITHOUT CONTRAST TECHNIQUE: Multidetector CT imaging of the abdomen and pelvis was performed following the standard protocol without IV contrast. COMPARISON:  02/15/2015 CT abdomen/pelvis. FINDINGS: Lower chest: Pacer lead is seen in the right ventricular apex. Mild bibasilar scarring versus atelectasis. Hepatobiliary: Normal liver with no liver mass. Cholecystectomy. Bile ducts are within expected post cholecystectomy limits with common bile duct diameter 8 mm. Stable moderate periampullary duodenal diverticulum. Pancreas: Normal, with no mass or duct dilation. Spleen: Normal size. No mass. Adrenals/Urinary Tract: Normal adrenals. Status post left nephrectomy. Peripherally calcified 1.7 cm structure in the left nephrectomy bed is mildly decreased in size since 02/15/2015 and is compatible with benign fat necrosis. No new mass or fluid collection  in the left nephrectomy bed. No right hydronephrosis. Simple right renal cysts, largest 3.7 cm in the anterior upper right kidney. No new contour deforming right renal mass. No right renal stones. Normal bladder. Stomach/Bowel: Grossly normal stomach. Normal caliber small bowel with no small bowel wall thickening. Appendix not discretely visualized. No pericecal inflammatory changes. Moderate diffuse colonic diverticulosis, most prominent in the sigmoid colon. There is focal colonic wall thickening in the proximal sigmoid colon with associated mild pericolonic fat stranding, compatible with acute sigmoid diverticulitis. No pericolonic free air or focal fluid collection. Vascular/Lymphatic: Atherosclerotic nonaneurysmal abdominal aorta. No pathologically enlarged lymph nodes in the abdomen or pelvis. Reproductive: Status post hysterectomy, with no abnormal findings at the vaginal cuff. No adnexal mass. Other: No pneumoperitoneum, ascites or focal fluid collection. Musculoskeletal: No aggressive appearing focal osseous lesions. Moderate thoracolumbar spondylosis. IMPRESSION: 1. Acute sigmoid diverticulitis. No evidence of free air or abscess. 2. Left nephrectomy. No suspicious findings in the left nephrectomy bed . 3. Aortic atherosclerosis. Electronically Signed   By: Ilona Sorrel M.D.   On: 10/13/2016 21:47    Procedures Procedures (including critical care time)  Medications Ordered in ED Medications  iopamidol (ISOVUE-300) 61 % injection (not administered)  metroNIDAZOLE (FLAGYL) IVPB 500 mg (not administered)  ciprofloxacin (CIPRO) IVPB 400 mg (not administered)  sodium chloride 0.9 % bolus 500 mL (500 mLs Intravenous New Bag/Given 10/13/16 1850)     Initial Impression / Assessment and Plan / ED Course  I have reviewed the triage vital signs and the nursing notes.  Pertinent labs & imaging results that were available during my care of the patient were reviewed by me and considered in my medical  decision making (see chart for details).     Patient with diverticulitis. Patient does not seem toxic and prefers to be treated as an outpatient. She will be given Flagyl Cipro Zofran and will follow-up with her PCP this week  Final Clinical Impressions(s) / ED Diagnoses   Final diagnoses:  Diverticulitis    New Prescriptions New Prescriptions   No medications on file     Milton Ferguson, MD 10/13/16 2232

## 2016-10-14 ENCOUNTER — Inpatient Hospital Stay (HOSPITAL_COMMUNITY)
Admission: EM | Admit: 2016-10-14 | Discharge: 2016-10-18 | DRG: 392 | Disposition: A | Discharge status: Home / Self Care | Payer: Medicare Other | Attending: Family Medicine | Admitting: Family Medicine

## 2016-10-14 ENCOUNTER — Encounter (HOSPITAL_COMMUNITY): Payer: Self-pay

## 2016-10-14 DIAGNOSIS — K5732 Diverticulitis of large intestine without perforation or abscess without bleeding: Secondary | ICD-10-CM | POA: Diagnosis not present

## 2016-10-14 DIAGNOSIS — I959 Hypotension, unspecified: Secondary | ICD-10-CM | POA: Diagnosis present

## 2016-10-14 DIAGNOSIS — Z950 Presence of cardiac pacemaker: Secondary | ICD-10-CM

## 2016-10-14 DIAGNOSIS — Z881 Allergy status to other antibiotic agents status: Secondary | ICD-10-CM

## 2016-10-14 DIAGNOSIS — I442 Atrioventricular block, complete: Secondary | ICD-10-CM | POA: Diagnosis present

## 2016-10-14 DIAGNOSIS — E785 Hyperlipidemia, unspecified: Secondary | ICD-10-CM | POA: Diagnosis present

## 2016-10-14 DIAGNOSIS — Z79899 Other long term (current) drug therapy: Secondary | ICD-10-CM

## 2016-10-14 DIAGNOSIS — E039 Hypothyroidism, unspecified: Secondary | ICD-10-CM | POA: Diagnosis present

## 2016-10-14 DIAGNOSIS — Z7982 Long term (current) use of aspirin: Secondary | ICD-10-CM

## 2016-10-14 DIAGNOSIS — K5792 Diverticulitis of intestine, part unspecified, without perforation or abscess without bleeding: Secondary | ICD-10-CM | POA: Diagnosis present

## 2016-10-14 DIAGNOSIS — Z853 Personal history of malignant neoplasm of breast: Secondary | ICD-10-CM

## 2016-10-14 DIAGNOSIS — I5032 Chronic diastolic (congestive) heart failure: Secondary | ICD-10-CM | POA: Diagnosis present

## 2016-10-14 DIAGNOSIS — N289 Disorder of kidney and ureter, unspecified: Secondary | ICD-10-CM | POA: Diagnosis present

## 2016-10-14 DIAGNOSIS — E7439 Other disorders of intestinal carbohydrate absorption: Secondary | ICD-10-CM | POA: Diagnosis present

## 2016-10-14 DIAGNOSIS — Z888 Allergy status to other drugs, medicaments and biological substances status: Secondary | ICD-10-CM

## 2016-10-14 DIAGNOSIS — R7303 Prediabetes: Secondary | ICD-10-CM | POA: Diagnosis present

## 2016-10-14 DIAGNOSIS — R112 Nausea with vomiting, unspecified: Secondary | ICD-10-CM | POA: Diagnosis present

## 2016-10-14 LAB — COMPREHENSIVE METABOLIC PANEL
ALBUMIN: 4.3 g/dL (ref 3.5–5.0)
ALT: 16 U/L (ref 14–54)
AST: 20 U/L (ref 15–41)
Alkaline Phosphatase: 115 U/L (ref 38–126)
Anion gap: 14 (ref 5–15)
BUN: 18 mg/dL (ref 6–20)
CHLORIDE: 104 mmol/L (ref 101–111)
CO2: 22 mmol/L (ref 22–32)
CREATININE: 1.52 mg/dL — AB (ref 0.44–1.00)
Calcium: 9.8 mg/dL (ref 8.9–10.3)
GFR calc Af Amer: 35 mL/min — ABNORMAL LOW (ref >60)
GFR, EST NON AFRICAN AMERICAN: 30 mL/min — AB (ref >60)
Glucose, Bld: 139 mg/dL — ABNORMAL HIGH (ref 65–99)
POTASSIUM: 3.5 mmol/L (ref 3.5–5.1)
SODIUM: 140 mmol/L (ref 135–145)
Total Bilirubin: 0.8 mg/dL (ref 0.3–1.2)
Total Protein: 7.5 g/dL (ref 6.5–8.1)

## 2016-10-14 LAB — CBC
HEMATOCRIT: 52 % — AB (ref 36.0–46.0)
Hemoglobin: 17.1 g/dL — ABNORMAL HIGH (ref 12.0–15.0)
MCH: 31.1 pg (ref 26.0–34.0)
MCHC: 32.9 g/dL (ref 30.0–36.0)
MCV: 94.5 fL (ref 78.0–100.0)
PLATELETS: 217 10*3/uL (ref 150–400)
RBC: 5.5 MIL/uL — ABNORMAL HIGH (ref 3.87–5.11)
RDW: 12.8 % (ref 11.5–15.5)
WBC: 8 10*3/uL (ref 4.0–10.5)

## 2016-10-14 LAB — LIPASE, BLOOD: LIPASE: 44 U/L (ref 11–51)

## 2016-10-14 LAB — I-STAT CG4 LACTIC ACID, ED: Lactic Acid, Venous: 1.4 mmol/L (ref 0.5–1.9)

## 2016-10-14 MED ORDER — ONDANSETRON 4 MG PO TBDP
4.0000 mg | ORAL_TABLET | Freq: Once | ORAL | Status: AC | PRN
  Administered 2016-10-14: 4 mg via ORAL

## 2016-10-14 MED ORDER — ONDANSETRON HCL 4 MG PO TABS: 4.0000 mg | ORAL_TABLET | Freq: Four times a day (QID) | ORAL | Status: DC | PRN

## 2016-10-14 MED ORDER — LEVOTHYROXINE SODIUM 50 MCG PO TABS
50.0000 ug | ORAL_TABLET | Freq: Every day | ORAL | Status: DC
  Administered 2016-10-15 – 2016-10-18 (×4): 50 ug via ORAL
  Filled 2016-10-14 (×4): qty 1

## 2016-10-14 MED ORDER — ONDANSETRON HCL 4 MG/2ML IJ SOLN
4.0000 mg | Freq: Four times a day (QID) | INTRAMUSCULAR | Status: DC | PRN
Start: 2016-10-14 — End: 2016-10-18
  Administered 2016-10-17: 4 mg via INTRAVENOUS
  Filled 2016-10-14: qty 2

## 2016-10-14 MED ORDER — AMOXICILLIN-POT CLAVULANATE 875-125 MG PO TABS: 1.0000 | ORAL_TABLET | Freq: Two times a day (BID) | ORAL | 0 refills | Status: DC

## 2016-10-14 MED ORDER — DIPHENHYDRAMINE HCL 25 MG PO CAPS
25.0000 mg | ORAL_CAPSULE | Freq: Once | ORAL | Status: AC
  Administered 2016-10-14: 25 mg via ORAL
  Filled 2016-10-14: qty 1

## 2016-10-14 MED ORDER — ACETAMINOPHEN 325 MG PO TABS
650.0000 mg | ORAL_TABLET | Freq: Four times a day (QID) | ORAL | Status: DC | PRN
  Administered 2016-10-16: 650 mg via ORAL
  Filled 2016-10-14: qty 2

## 2016-10-14 MED ORDER — PIPERACILLIN-TAZOBACTAM 3.375 G IVPB
3.3750 g | Freq: Three times a day (TID) | INTRAVENOUS | Status: AC
  Administered 2016-10-14 – 2016-10-16 (×6): 3.375 g via INTRAVENOUS
  Filled 2016-10-14 (×8): qty 50

## 2016-10-14 MED ORDER — ONDANSETRON 4 MG PO TBDP
ORAL_TABLET | ORAL | Status: AC
Start: 2016-10-14 — End: 2016-10-15
  Filled 2016-10-14: qty 1

## 2016-10-14 MED ORDER — ONDANSETRON HCL 4 MG/2ML IJ SOLN
4.0000 mg | Freq: Once | INTRAMUSCULAR | Status: AC
  Administered 2016-10-14: 4 mg via INTRAVENOUS
  Filled 2016-10-14: qty 2

## 2016-10-14 MED ORDER — MIDODRINE HCL 2.5 MG PO TABS
2.5000 mg | ORAL_TABLET | Freq: Every day | ORAL | Status: DC
  Administered 2016-10-15 – 2016-10-18 (×4): 2.5 mg via ORAL
  Filled 2016-10-14 (×4): qty 1

## 2016-10-14 MED ORDER — SODIUM CHLORIDE 0.9 % IV SOLN
INTRAVENOUS | Status: AC
  Administered 2016-10-14: 22:00:00 via INTRAVENOUS

## 2016-10-14 MED ORDER — SODIUM CHLORIDE 0.9 % IV BOLUS (SEPSIS)
500.0000 mL | Freq: Once | INTRAVENOUS | Status: AC
  Administered 2016-10-14: 500 mL via INTRAVENOUS

## 2016-10-14 MED ORDER — ACETAMINOPHEN 650 MG RE SUPP: 650.0000 mg | Freq: Four times a day (QID) | RECTAL | Status: DC | PRN

## 2016-10-14 NOTE — ED Notes (Signed)
Pt attempted to get urine specimen. Urine specimen was contained by stool.

## 2016-10-14 NOTE — ED Provider Notes (Signed)
Pt had isolated rash/urticaria to left arm after IV ABX infusion No angioedema, no resp distress Pt well appearing Will give benadryl D/c cipro/flagyl Will substitute with augmentin Pt stable for d/c home, will monitor for another 30 minutes    [name], MD 10/14/16 0010

## 2016-10-14 NOTE — ED Triage Notes (Signed)
Pt reports nausea and vomiting onset this morning after taking an abx that she doesn't know the name of. Pt was vomiting upon arrival. She also reports 8/10 generalized abd pain. She states she is taking abx to treat and infection in her colon

## 2016-10-14 NOTE — H&P (Signed)
History and Physical    [name] VQQ:[id number] DOB: 1927-07-08 DOA: 10/14/2016  PCP: Nichole Gravel, MD  Patient coming from: Home.  Chief Complaint: Nausea vomiting.  HPI: [name] is a 83 y.o. female with history of breast cancer in remission. We have block status post pacemaker placement, diastolic CHF, hypothyroidism and hyperlipidemia presents to the ER because of persistent nausea vomiting. Patient had come to the ER 48 hours ago and had CT abdomen and pelvis done for abdominal pain. CT showed sigmoid diverticulitis for which patient was initially placed on Cipro and Flagyl following which patient had reaction and was changed to Augmentin. Patient had some food outside this morning following which patient started developing multiple episodes of nausea vomiting. Patient still has a left upper and lower quadrant pain.   ED Course: In the ER patient had further episodes of vomiting. Denies any fever chills or diarrhea. Since patient has persistent vomiting patient is being admitted for IV antibiotics. Patient was mildly hypotensive on admission and was given fluid bolus. Patient states usually runs low blood pressure but this time it was more than usual.  Review of Systems: As per HPI, rest all negative.   Past Medical History:  Diagnosis Date  . Cataract   . Complete heart block (HCC)    s/p PPM implant (MDT) by Dr Blanch Media.  Atrial lead could not be paced at time of the procedure.  She has chronic AV dysociation  . Delusional disorder ([place])   . Exogenous obesity   . Hypercholesterolemia   . Invasive ductal carcinoma of breast ([name]) 03/2007   BILATERAL BREASTS  . Orthostatic hypotension    treated with midodrine by Dr Rollene [name]  . PONV (postoperative nausea and vomiting)   . Thyroid disease   . Venous insufficiency     Past Surgical History:  Procedure Laterality Date  . ABDOMINAL HYSTERECTOMY    . BREAST LUMPECTOMY Bilateral 2009  . CATARACT EXTRACTION     EYE SURGERY X 2  . CHOLECYSTECTOMY    . COLONOSCOPY N/A 02/16/2015   Procedure: COLONOSCOPY;  Surgeon: Wilford Corner, MD;  Location: [facility] ENDOSCOPY;  Service: Endoscopy;  Laterality: N/A;  . EP IMPLANTABLE DEVICE N/A 02/23/2015   pacemaker generator change (MDT Sensia SR) by Dr Rayann Heman   . ESOPHAGOGASTRODUODENOSCOPY N/A 02/16/2015   Procedure: ESOPHAGOGASTRODUODENOSCOPY (EGD);  Surgeon: Wilford Corner, MD;  Location: [facility] ENDOSCOPY;  Service: Endoscopy;  Laterality: N/A;  . PACEMAKER INSERTION     MDT implanted by Dr Blanch Media.  Atrial lead placement was unsuccessful.  She has chronic AV dysociation  . remote right radical nephrectomy  2009     reports that she has never smoked. She has never used smokeless tobacco. She reports that she does not drink alcohol or use drugs.  Allergies  Allergen Reactions  . Erythromycin Hives  . Tape Other (See Comments)    BAND AIDS-SKIN IRRITATION  . Codeine Nausea And Vomiting  . Flagyl [Metronidazole] Hives  . Macrobid [Nitrofurantoin Macrocrystal] Nausea And Vomiting  . Tolterodine Nausea And Vomiting  . Ciprofloxacin Hives and Rash    Family History  Problem Relation Age of Onset  . Heart disease Maternal Grandmother   . Heart disease Mother     Prior to Admission medications   Medication Sig Start Date End Date Taking? Authorizing Provider  amoxicillin-clavulanate (AUGMENTIN) 875-125 MG tablet Take 1 tablet by mouth 2 (two) times daily. One po bid x 7 days Patient taking differently: Take 1 tablet by mouth  2 (two) times daily. 7 day course started 10/14/16 am 10/14/16  Yes Nichole Fraise, MD  aspirin EC 81 MG tablet Take 81 mg by mouth daily before breakfast.   Yes [provider]  Calcium Carbonate-Vitamin D (CALTRATE 600+D PO) Take 600 mg by mouth daily after breakfast.   Yes [provider]  furosemide (LASIX) 20 MG tablet Take 20 mg by mouth daily with breakfast.    Yes [provider]  letrozole (FEMARA) 2.5  MG tablet Take 1 tablet (2.5 mg total) by mouth daily. Patient taking differently: Take 2.5 mg by mouth daily with breakfast.  09/19/16  Yes Nichole Lose, MD  levothyroxine (SYNTHROID, LEVOTHROID) 50 MCG tablet Take 50 mcg by mouth daily before breakfast.    Yes [provider]  meclizine (ANTIVERT) 12.5 MG tablet Take 1 tablet (12.5 mg total) by mouth 3 (three) times daily as needed for dizziness. 07/29/16  Yes Long, Wonda Olds, MD  midodrine (PROAMATINE) 2.5 MG tablet Take 2.5 mg by mouth daily after breakfast.    Yes [provider]  ondansetron (ZOFRAN ODT) 4 MG disintegrating tablet 4mg  ODT q4 hours prn nausea/vomit Patient taking differently: Take 4 mg by mouth every 4 (four) hours as needed for nausea or vomiting.  10/13/16  Yes Nichole Ferguson, MD  OVER THE COUNTER MEDICATION Place 1 drop into both eyes daily as needed (dry eyes). Over the counter lubricating eye drop    Yes [provider]  Probiotic Product (PROBIOTIC PO) Take 1 tablet by mouth daily with breakfast.   Yes [provider]    Physical Exam: Vitals:   10/14/16 1340 10/14/16 1746 10/14/16 1839 10/14/16 1916  BP: 100/64 99/72 97/60  111/68  Pulse: 76 66 66 73  Resp: 18 16 17 18   Temp: 98.1 F (36.7 C)     TempSrc: Oral     SpO2: 93% 93% 92% 94%      Constitutional: Moderately built and nourished. Vitals:   10/14/16 1340 10/14/16 1746 10/14/16 1839 10/14/16 1916  BP: 100/64 99/72 97/60  111/68  Pulse: 76 66 66 73  Resp: 18 16 17 18   Temp: 98.1 F (36.7 C)     TempSrc: Oral     SpO2: 93% 93% 92% 94%   Eyes: Anicteric no pallor. ENMT: No discharge from the ears eyes nose and mouth. Neck: No mass felt. No JVD appreciated. Respiratory: No rhonchi or crepitations. Cardiovascular: S1-S2 heard no murmurs appreciated. Abdomen: Mild tenderness in the left flank. No guarding or rigidity. Musculoskeletal: No joint effusion no edema. Skin: No rash. Neurologic: Alert awake oriented to  time place and person. Moves all extremities. Psychiatric: Appears normal. Normal affect.   Labs on Admission: I have personally reviewed following labs and imaging studies  CBC:  Recent Labs Lab 10/13/16 1717 10/14/16 1350  WBC 8.8 8.0  NEUTROABS 5.9  --   HGB 14.9 17.1*  HCT 45.4 52.0*  MCV 95.4 94.5  PLT 217 616   Basic Metabolic Panel:  Recent Labs Lab 10/13/16 1717 10/14/16 1350  NA 140 140  K 3.3* 3.5  CL 104 104  CO2 27 22  GLUCOSE 96 139*  BUN 18 18  CREATININE 1.52* 1.52*  CALCIUM 9.0 9.8   GFR: Estimated Creatinine Clearance: 31 mL/min (A) (by C-G formula based on SCr of 1.52 mg/dL (H)). Liver Function Tests:  Recent Labs Lab 10/13/16 1717 10/14/16 1350  AST 15 20  ALT 12* 16  ALKPHOS 100 115  BILITOT 0.8 0.8  PROT 6.7 7.5  ALBUMIN 3.5 4.3    Recent Labs Lab 10/14/16 1350  LIPASE 44   No results for input(s): AMMONIA in the last 168 hours. Coagulation Profile: No results for input(s): INR, PROTIME in the last 168 hours. Cardiac Enzymes: No results for input(s): CKTOTAL, CKMB, CKMBINDEX, TROPONINI in the last 168 hours. BNP (last 3 results) No results for input(s): PROBNP in the last 8760 hours. HbA1C: No results for input(s): HGBA1C in the last 72 hours. CBG: No results for input(s): GLUCAP in the last 168 hours. Lipid Profile: No results for input(s): CHOL, HDL, LDLCALC, TRIG, CHOLHDL, LDLDIRECT in the last 72 hours. Thyroid Function Tests: No results for input(s): TSH, T4TOTAL, FREET4, T3FREE, THYROIDAB in the last 72 hours. Anemia Panel: No results for input(s): VITAMINB12, FOLATE, FERRITIN, TIBC, IRON, RETICCTPCT in the last 72 hours. Urine analysis:    Component Value Date/Time   COLORURINE YELLOW 05/05/2016 0246   APPEARANCEUR CLEAR 05/05/2016 0246   LABSPEC 1.017 05/05/2016 0246   PHURINE 5.0 05/05/2016 0246   GLUCOSEU NEGATIVE 05/05/2016 0246   HGBUR NEGATIVE 05/05/2016 0246   BILIRUBINUR NEGATIVE 05/05/2016 0246    KETONESUR NEGATIVE 05/05/2016 0246   PROTEINUR NEGATIVE 05/05/2016 0246   UROBILINOGEN 0.2 04/12/2014 1928   NITRITE NEGATIVE 05/05/2016 0246   LEUKOCYTESUR MODERATE (A) 05/05/2016 0246   Sepsis Labs: @LABRCNTIP (procalcitonin:4,lacticidven:4) )No results found for this or any previous visit (from the past 240 hour(s)).   Radiological Exams on Admission: Ct Abdomen Pelvis Wo Contrast  Result Date: 10/13/2016 CLINICAL DATA:  Abdominal pain and diarrhea. EXAM: CT ABDOMEN AND PELVIS WITHOUT CONTRAST TECHNIQUE: Multidetector CT imaging of the abdomen and pelvis was performed following the standard protocol without IV contrast. COMPARISON:  02/15/2015 CT abdomen/pelvis. FINDINGS: Lower chest: Pacer lead is seen in the right ventricular apex. Mild bibasilar scarring versus atelectasis. Hepatobiliary: Normal liver with no liver mass. Cholecystectomy. Bile ducts are within expected post cholecystectomy limits with common bile duct diameter 8 mm. Stable moderate periampullary duodenal diverticulum. Pancreas: Normal, with no mass or duct dilation. Spleen: Normal size. No mass. Adrenals/Urinary Tract: Normal adrenals. Status post left nephrectomy. Peripherally calcified 1.7 cm structure in the left nephrectomy bed is mildly decreased in size since 02/15/2015 and is compatible with benign fat necrosis. No new mass or fluid collection in the left nephrectomy bed. No right hydronephrosis. Simple right renal cysts, largest 3.7 cm in the anterior upper right kidney. No new contour deforming right renal mass. No right renal stones. Normal bladder. Stomach/Bowel: Grossly normal stomach. Normal caliber small bowel with no small bowel wall thickening. Appendix not discretely visualized. No pericecal inflammatory changes. Moderate diffuse colonic diverticulosis, most prominent in the sigmoid colon. There is focal colonic wall thickening in the proximal sigmoid colon with associated mild pericolonic fat stranding, compatible  with acute sigmoid diverticulitis. No pericolonic free air or focal fluid collection. Vascular/Lymphatic: Atherosclerotic nonaneurysmal abdominal aorta. No pathologically enlarged lymph nodes in the abdomen or pelvis. Reproductive: Status post hysterectomy, with no abnormal findings at the vaginal cuff. No adnexal mass. Other: No pneumoperitoneum, ascites or focal fluid collection. Musculoskeletal: No aggressive appearing focal osseous lesions. Moderate thoracolumbar spondylosis. IMPRESSION: 1. Acute sigmoid diverticulitis. No evidence of free air or abscess. 2. Left nephrectomy. No suspicious findings in the left nephrectomy bed . 3. Aortic atherosclerosis. Electronically Signed   By: Ilona Sorrel M.D.   On: 10/13/2016 21:47     Assessment/Plan Principal Problem:   Sigmoid diverticulitis Active Problems:   Complete heart block ([place])  Hypothyroidism   Diverticulitis    1. Nausea vomiting and acute sigmoid diverticulitis - I have placed patient on clear liquid diet and Zosyn for now. Continue IV fluids. Closely observe. Patient was mildly hypotensive on admission and was given fluid bolus 2. Hypothyroidism on Synthroid. 3. Hyperlipidemia on statins. 4. History of complete heart block status post pacemaker placement. 5. History of orthostatic hypotension on midodrine. 6. History of breast cancer in remission. 7. Diastolic dysfunction - hold Lasix.   DVT prophylaxis: SCDs. Code Status: Full code.  Family Communication: Discussed with patient.  Disposition Plan: Home.  Consults called: None.  Admission status: Observation.    Rise Patience MD [facility] Pager [phone].  If 7PM-7AM, please contact night-coverage [email or web address] Password [facility]  10/14/2016, 8:33 PM

## 2016-10-14 NOTE — ED Notes (Signed)
Pt began having itching and redness to her left arm. Provider notified.

## 2016-10-14 NOTE — ED Provider Notes (Signed)
[place] DEPT Provider Note   CSN: [id number] Arrival date & time: 10/14/16  1320     History   Chief Complaint Chief Complaint  Patient presents with  . Nausea  . Emesis    HPI [name] is a 83 y.o. female.  She presents for evaluation of nausea and vomiting following eating, around 11 AM today.  Prior to that she had taken a single dose of Augmentin, as treatment for diverticulitis.  This was diagnosed last evening on CT imaging, and attempted to be treated as outpatient.  Initially they tried treating her with Cipro and Flagyl however she had an allergic reaction to 1 of the agents, so she was prescribed Augmentin.  She states her abdominal pain is mild at this time.  She denies fever, cough, chest pain, weakness or dizziness.  She lives alone.  She is here with her sister.  There are no other known modifying factors.  HPI  Past Medical History:  Diagnosis Date  . Cataract   . Complete heart block (HCC)    s/p PPM implant (MDT) by Dr Blanch Media.  Atrial lead could not be paced at time of the procedure.  She has chronic AV dysociation  . Delusional disorder ([place])   . Exogenous obesity   . Hypercholesterolemia   . Invasive ductal carcinoma of breast ([place]) 03/2007   BILATERAL BREASTS  . Orthostatic hypotension    treated with midodrine by Dr Rollene [name]  . PONV (postoperative nausea and vomiting)   . Thyroid disease   . Venous insufficiency     Patient Active Problem List   Diagnosis Date Noted  . Varicose veins of bilateral lower extremities with other complications 23/55/7322  . Venous insufficiency 08/25/2015  . Cellulitis 08/09/2015  . Pressure ulcer 08/09/2015  . Delusional disorder ([place]) 07/05/2015  . Bereavement reaction 07/05/2015  . Pacemaker at end of battery life 02/23/2015  . CHB (complete heart block) ([place])   . Acute GI bleeding 02/15/2015  . Acute kidney injury (nontraumatic) ([place]) 02/15/2015  . Hypothyroidism 02/15/2015  . Left upper  quadrant pain   . Osteopenia 12/22/2013  . Postural dizziness 04/05/2013  . Complete heart block ([place]) 02/26/2013  . S/P placement of cardiac pacemaker 02/26/2013  . Bilateral breast cancer ([place]) 03/25/2011    Past Surgical History:  Procedure Laterality Date  . ABDOMINAL HYSTERECTOMY    . BREAST LUMPECTOMY Bilateral 2009  . CATARACT EXTRACTION     EYE SURGERY X 2  . CHOLECYSTECTOMY    . COLONOSCOPY N/A 02/16/2015   Procedure: COLONOSCOPY;  Surgeon: Wilford Corner, MD;  Location: [facility] ENDOSCOPY;  Service: Endoscopy;  Laterality: N/A;  . EP IMPLANTABLE DEVICE N/A 02/23/2015   pacemaker generator change (MDT Sensia SR) by Dr Rayann Heman   . ESOPHAGOGASTRODUODENOSCOPY N/A 02/16/2015   Procedure: ESOPHAGOGASTRODUODENOSCOPY (EGD);  Surgeon: Wilford Corner, MD;  Location: [facility] ENDOSCOPY;  Service: Endoscopy;  Laterality: N/A;  . PACEMAKER INSERTION     MDT implanted by Dr Blanch Media.  Atrial lead placement was unsuccessful.  She has chronic AV dysociation  . remote right radical nephrectomy  2009    OB History    No data available       Home Medications    Prior to Admission medications   Medication Sig Start Date End Date Taking? Authorizing Provider  amoxicillin-clavulanate (AUGMENTIN) 875-125 MG tablet Take 1 tablet by mouth 2 (two) times daily. One po bid x 7 days Patient taking differently: Take 1 tablet by mouth 2 (two) times daily.  7 day course started 10/14/16 am 10/14/16  Yes [name], MD  aspirin EC 81 MG tablet Take 81 mg by mouth daily before breakfast.   Yes [provider]  Calcium Carbonate-Vitamin D (CALTRATE 600+D PO) Take 600 mg by mouth daily after breakfast.   Yes [provider]  furosemide (LASIX) 20 MG tablet Take 20 mg by mouth daily with breakfast.    Yes [provider]  letrozole (FEMARA) 2.5 MG tablet Take 1 tablet (2.5 mg total) by mouth daily. Patient taking differently: Take 2.5 mg by mouth daily with breakfast.  09/19/16  Yes  [name], MD  levothyroxine (SYNTHROID, LEVOTHROID) 50 MCG tablet Take 50 mcg by mouth daily before breakfast.    Yes [provider]  meclizine (ANTIVERT) 12.5 MG tablet Take 1 tablet (12.5 mg total) by mouth 3 (three) times daily as needed for dizziness. 07/29/16  Yes Long, Wonda Olds, MD  midodrine (PROAMATINE) 2.5 MG tablet Take 2.5 mg by mouth daily after breakfast.    Yes [provider]  ondansetron (ZOFRAN ODT) 4 MG disintegrating tablet 4mg  ODT q4 hours prn nausea/vomit Patient taking differently: Take 4 mg by mouth every 4 (four) hours as needed for nausea or vomiting.  10/13/16  Yes Milton Ferguson, MD  OVER THE COUNTER MEDICATION Place 1 drop into both eyes daily as needed (dry eyes). Over the counter lubricating eye drop    Yes [provider]  Probiotic Product (PROBIOTIC PO) Take 1 tablet by mouth daily with breakfast.   Yes [provider]    Family History Family History  Problem Relation Age of Onset  . Heart disease Maternal Grandmother   . Heart disease Mother     Social History Social History  Substance Use Topics  . Smoking status: Never Smoker  . Smokeless tobacco: Never Used  . Alcohol use No     Allergies   Erythromycin; Tape; Codeine; Flagyl [metronidazole]; Macrobid [nitrofurantoin macrocrystal]; Tolterodine; and Ciprofloxacin   Review of Systems Review of Systems  All other systems reviewed and are negative.    Physical Exam Updated Vital Signs BP 111/68   Pulse 73   Temp 98.1 F (36.7 C) (Oral)   Resp 18   SpO2 94%   Physical Exam  Constitutional: She is oriented to person, place, and time. She appears well-developed.  Elderly, obese  HENT:  Head: Normocephalic and atraumatic.  Eyes: Conjunctivae and EOM are normal. Pupils are equal, round, and reactive to light.  Neck: Normal range of motion and phonation normal. Neck supple.  Cardiovascular: Normal rate and regular rhythm.   Pulmonary/Chest: Effort  normal and breath sounds normal. She exhibits no tenderness.  Abdominal: Soft. Bowel sounds are normal. She exhibits no distension and no mass. There is tenderness (Mild left upper and mid abdomen). There is no rebound and no guarding.  Musculoskeletal: Normal range of motion.  Neurological: She is alert and oriented to person, place, and time. She exhibits normal muscle tone.  Skin: Skin is warm and dry.  Psychiatric: She has a normal mood and affect. Her behavior is normal. Judgment and thought content normal.  Nursing note and vitals reviewed.    ED Treatments / Results  Labs (all labs ordered are listed, but only abnormal results are displayed) Labs Reviewed  COMPREHENSIVE METABOLIC PANEL - Abnormal; Notable for the following:       Result Value   Glucose, Bld 139 (*)    Creatinine, Ser 1.52 (*)    GFR  calc non Af Amer 30 (*)    GFR calc Af Amer 35 (*)    All other components within normal limits  CBC - Abnormal; Notable for the following:    RBC 5.50 (*)    Hemoglobin 17.1 (*)    HCT 52.0 (*)    All other components within normal limits  LIPASE, BLOOD  URINALYSIS, ROUTINE W REFLEX MICROSCOPIC  I-STAT CG4 LACTIC ACID, ED    EKG  EKG Interpretation None       Radiology Ct Abdomen Pelvis Wo Contrast  Result Date: 10/13/2016 CLINICAL DATA:  Abdominal pain and diarrhea. EXAM: CT ABDOMEN AND PELVIS WITHOUT CONTRAST TECHNIQUE: Multidetector CT imaging of the abdomen and pelvis was performed following the standard protocol without IV contrast. COMPARISON:  02/15/2015 CT abdomen/pelvis. FINDINGS: Lower chest: Pacer lead is seen in the right ventricular apex. Mild bibasilar scarring versus atelectasis. Hepatobiliary: Normal liver with no liver mass. Cholecystectomy. Bile ducts are within expected post cholecystectomy limits with common bile duct diameter 8 mm. Stable moderate periampullary duodenal diverticulum. Pancreas: Normal, with no mass or duct dilation. Spleen: Normal size.  No mass. Adrenals/Urinary Tract: Normal adrenals. Status post left nephrectomy. Peripherally calcified 1.7 cm structure in the left nephrectomy bed is mildly decreased in size since 02/15/2015 and is compatible with benign fat necrosis. No new mass or fluid collection in the left nephrectomy bed. No right hydronephrosis. Simple right renal cysts, largest 3.7 cm in the anterior upper right kidney. No new contour deforming right renal mass. No right renal stones. Normal bladder. Stomach/Bowel: Grossly normal stomach. Normal caliber small bowel with no small bowel wall thickening. Appendix not discretely visualized. No pericecal inflammatory changes. Moderate diffuse colonic diverticulosis, most prominent in the sigmoid colon. There is focal colonic wall thickening in the proximal sigmoid colon with associated mild pericolonic fat stranding, compatible with acute sigmoid diverticulitis. No pericolonic free air or focal fluid collection. Vascular/Lymphatic: Atherosclerotic nonaneurysmal abdominal aorta. No pathologically enlarged lymph nodes in the abdomen or pelvis. Reproductive: Status post hysterectomy, with no abnormal findings at the vaginal cuff. No adnexal mass. Other: No pneumoperitoneum, ascites or focal fluid collection. Musculoskeletal: No aggressive appearing focal osseous lesions. Moderate thoracolumbar spondylosis. IMPRESSION: 1. Acute sigmoid diverticulitis. No evidence of free air or abscess. 2. Left nephrectomy. No suspicious findings in the left nephrectomy bed . 3. Aortic atherosclerosis. Electronically Signed   By: Ilona Sorrel M.D.   On: 10/13/2016 21:47    Procedures Procedures (including critical care time)  Medications Ordered in ED Medications  ondansetron (ZOFRAN-ODT) disintegrating tablet 4 mg (4 mg Oral Given 10/14/16 1351)     Initial Impression / Assessment and Plan / ED Course  I have reviewed the triage vital signs and the nursing notes.  Pertinent labs & imaging results  that were available during my care of the patient were reviewed by me and considered in my medical decision making (see chart for details).      Patient Vitals for the past 24 hrs:  BP Temp Temp src Pulse Resp SpO2  10/14/16 1916 111/68 - - 73 18 94 %  10/14/16 1839 97/60 - - 66 17 92 %  10/14/16 1746 99/72 - - 66 16 93 %  10/14/16 1340 100/64 98.1 F (36.7 C) Oral 76 18 93 %    8:01 PM Reevaluation with update and discussion. After initial assessment and treatment, an updated evaluation reveals she continues to be somewhat uncomfortable.  Findings discussed with patient and sister, all questions answered. Ritha Sampedro  L    8:02 PM-Consult complete with hospitalist. Patient case explained and discussed.  He agrees to admit patient for further evaluation and treatment. Call ended at 20:12  Final Clinical Impressions(s) / ED Diagnoses   Final diagnoses:  Diverticulitis    Diverticulitis, with nausea vomiting and persistent abdominal pain.  Patient failed treatment as an outpatient.  Of note she is considered to have a allergic reaction to either Cipro or Flagyl. Patient had transiently low blood pressure here in the ED but it has improved.  Nursing Notes Reviewed/ Care Coordinated Applicable Imaging Reviewed Interpretation of Laboratory Data incorporated into ED treatment  Plan: Admit  New Prescriptions New Prescriptions   No medications on file     Daleen Bo, MD 10/14/16 2116

## 2016-10-15 DIAGNOSIS — R7303 Prediabetes: Secondary | ICD-10-CM | POA: Diagnosis present

## 2016-10-15 DIAGNOSIS — R112 Nausea with vomiting, unspecified: Secondary | ICD-10-CM | POA: Diagnosis not present

## 2016-10-15 DIAGNOSIS — E785 Hyperlipidemia, unspecified: Secondary | ICD-10-CM | POA: Diagnosis present

## 2016-10-15 DIAGNOSIS — Z7982 Long term (current) use of aspirin: Secondary | ICD-10-CM | POA: Diagnosis not present

## 2016-10-15 DIAGNOSIS — K5792 Diverticulitis of intestine, part unspecified, without perforation or abscess without bleeding: Secondary | ICD-10-CM | POA: Diagnosis not present

## 2016-10-15 DIAGNOSIS — I5032 Chronic diastolic (congestive) heart failure: Secondary | ICD-10-CM | POA: Diagnosis present

## 2016-10-15 DIAGNOSIS — E7439 Other disorders of intestinal carbohydrate absorption: Secondary | ICD-10-CM | POA: Diagnosis present

## 2016-10-15 DIAGNOSIS — Z950 Presence of cardiac pacemaker: Secondary | ICD-10-CM | POA: Diagnosis not present

## 2016-10-15 DIAGNOSIS — Z79899 Other long term (current) drug therapy: Secondary | ICD-10-CM | POA: Diagnosis not present

## 2016-10-15 DIAGNOSIS — K5732 Diverticulitis of large intestine without perforation or abscess without bleeding: Secondary | ICD-10-CM | POA: Diagnosis not present

## 2016-10-15 DIAGNOSIS — E039 Hypothyroidism, unspecified: Secondary | ICD-10-CM | POA: Diagnosis not present

## 2016-10-15 DIAGNOSIS — N289 Disorder of kidney and ureter, unspecified: Secondary | ICD-10-CM | POA: Diagnosis present

## 2016-10-15 DIAGNOSIS — Z888 Allergy status to other drugs, medicaments and biological substances status: Secondary | ICD-10-CM | POA: Diagnosis not present

## 2016-10-15 DIAGNOSIS — Z881 Allergy status to other antibiotic agents status: Secondary | ICD-10-CM | POA: Diagnosis not present

## 2016-10-15 DIAGNOSIS — I959 Hypotension, unspecified: Secondary | ICD-10-CM | POA: Diagnosis present

## 2016-10-15 DIAGNOSIS — R195 Other fecal abnormalities: Secondary | ICD-10-CM | POA: Diagnosis not present

## 2016-10-15 DIAGNOSIS — Z853 Personal history of malignant neoplasm of breast: Secondary | ICD-10-CM | POA: Diagnosis not present

## 2016-10-15 LAB — BASIC METABOLIC PANEL
ANION GAP: 9 (ref 5–15)
BUN: 19 mg/dL (ref 6–20)
CHLORIDE: 109 mmol/L (ref 101–111)
CO2: 25 mmol/L (ref 22–32)
CREATININE: 1.5 mg/dL — AB (ref 0.44–1.00)
Calcium: 8.1 mg/dL — ABNORMAL LOW (ref 8.9–10.3)
GFR calc non Af Amer: 30 mL/min — ABNORMAL LOW (ref >60)
GFR, EST AFRICAN AMERICAN: 35 mL/min — AB (ref >60)
GLUCOSE: 97 mg/dL (ref 65–99)
Potassium: 3.2 mmol/L — ABNORMAL LOW (ref 3.5–5.1)
Sodium: 143 mmol/L (ref 135–145)

## 2016-10-15 LAB — CBC
HCT: 44.3 % (ref 36.0–46.0)
HEMOGLOBIN: 13.7 g/dL (ref 12.0–15.0)
MCH: 30 pg (ref 26.0–34.0)
MCHC: 30.9 g/dL (ref 30.0–36.0)
MCV: 97.1 fL (ref 78.0–100.0)
Platelets: 201 10*3/uL (ref 150–400)
RBC: 4.56 MIL/uL (ref 3.87–5.11)
RDW: 13.2 % (ref 11.5–15.5)
WBC: 6.3 10*3/uL (ref 4.0–10.5)

## 2016-10-15 NOTE — Progress Notes (Signed)
Patient ID: [name], female   DOB: 1927-11-11, 83 y.o.   MRN: [id number]                                                                PROGRESS NOTE                                                                                                                                                                                                             Patient Demographics:    [name], is a 83 y.o. female, DOB - June 08, 1927, KAJ:[id number]  Admit date - 10/14/2016   Admitting Physician Rise Patience, MD  Outpatient Primary MD for the patient is [name], MD  LOS - 0  Outpatient Specialists:     Chief Complaint  Patient presents with  . Nausea  . Emesis       Brief Narrative  83 y.o. female with history of breast cancer in remission. We have block status post pacemaker placement, diastolic CHF, hypothyroidism and hyperlipidemia presents to the ER because of persistent nausea vomiting. Patient had come to the ER 48 hours ago and had CT abdomen and pelvis done for abdominal pain. CT showed sigmoid diverticulitis for which patient was initially placed on Cipro and Flagyl following which patient had reaction and was changed to Augmentin. Patient had some food outside this morning following which patient started developing multiple episodes of nausea vomiting. Patient still has a left upper and lower quadrant pain.   ED Course: In the ER patient had further episodes of vomiting. Denies any fever chills or diarrhea. Since patient has persistent vomiting patient is being admitted for IV antibiotics. Patient was mildly hypotensive on admission and was given fluid bolus. Patient states usually runs low blood pressure but this time it was more than usual.   Subjective:    [name] today has some loose stool still, and some mild LLQ pain.   , No headache, No chest pain,  - No Nausea, No new weakness tingling or numbness, No Cough - SOB.    Assessment  & Plan :    Principal Problem:   Sigmoid diverticulitis Active Problems:   Complete heart block (HCC)   Hypothyroidism   Diverticulitis   N/v and acute sigmoid diverticulitis Clear liquids Ns iv Zosyn iv  Mild hypotension Received ns iv bolus x1  Mild renal insufficiency Cont ns iv Check cmp in am  Glucose intolerance (hga1c=6.0) Stable  Hypothyroidism Cont synthroid  Hyperlipidemia Cont statin  Heart Block S/p pacer  Hx of breast cancer in remission      DVT prophylaxis: SCDs. Code Status: Full code.  Family Communication: Discussed with patient.  Disposition Plan: Home.  Consults called: None.  Admission status: Observation.   Lab Results  Component Value Date   PLT 217 10/14/2016    Antibiotics  :  Zosyn iv 6/18=>  Anti-infectives    Start     Dose/Rate Route Frequency Ordered Stop   10/14/16 2230  piperacillin-tazobactam (ZOSYN) IVPB 3.375 g     3.375 g 12.5 mL/hr over 240 Minutes Intravenous Every 8 hours 10/14/16 2101          Objective:   Vitals:   10/14/16 2100 10/14/16 2115 10/14/16 2221 10/15/16 0035  BP: 103/88 (!) 104/56 (!) 101/56 122/87  Pulse: 61 60 66 75  Resp: 15 17 (!) 22 20  Temp:    99.1 F (37.3 C)  TempSrc:    Oral  SpO2: 94% 98% 95% 96%    Wt Readings from Last 3 Encounters:  10/13/16 90.9 kg (200 lb 8 oz)  09/19/16 93.9 kg (207 lb)  07/29/16 95.8 kg (211 lb 4 oz)     Intake/Output Summary (Last 24 hours) at 10/15/16 0613 Last data filed at 10/15/16 0500  Gross per 24 hour  Intake            547.5 ml  Output                0 ml  Net            547.5 ml     Physical Exam  Awake Alert, Oriented X 3, No new F.N deficits, Normal affect [place].AT,PERRAL Supple Neck,No JVD, No cervical lymphadenopathy appriciated.  Symmetrical Chest wall movement, Good air movement bilaterally, CTAB RRR,No Gallops,Rubs or new Murmurs, No Parasternal Heave +ve B.Sounds, Abd Soft, No tenderness, No organomegaly appriciated, No rebound -  guarding or rigidity. No Cyanosis, Clubbing or edema, No new Rash or bruise      Data Review:    CBC  Recent Labs Lab 10/13/16 1717 10/14/16 1350  WBC 8.8 8.0  HGB 14.9 17.1*  HCT 45.4 52.0*  PLT 217 217  MCV 95.4 94.5  MCH 31.3 31.1  MCHC 32.8 32.9  RDW 12.8 12.8  LYMPHSABS 1.4  --   MONOABS 1.0  --   EOSABS 0.4  --   BASOSABS 0.0  --     Chemistries   Recent Labs Lab 10/13/16 1717 10/14/16 1350  NA 140 140  K 3.3* 3.5  CL 104 104  CO2 27 22  GLUCOSE 96 139*  BUN 18 18  CREATININE 1.52* 1.52*  CALCIUM 9.0 9.8  AST 15 20  ALT 12* 16  ALKPHOS 100 115  BILITOT 0.8 0.8   ------------------------------------------------------------------------------------------------------------------ No results for input(s): CHOL, HDL, LDLCALC, TRIG, CHOLHDL, LDLDIRECT in the last 72 hours.  Lab Results  Component Value Date   HGBA1C 6.0 (H) 08/11/2015   ------------------------------------------------------------------------------------------------------------------ No results for input(s): TSH, T4TOTAL, T3FREE, THYROIDAB in the last 72 hours.  Invalid input(s): FREET3 ------------------------------------------------------------------------------------------------------------------ No results for input(s): VITAMINB12, FOLATE, FERRITIN, TIBC, IRON, RETICCTPCT in the last 72 hours.  Coagulation profile No results for input(s): INR, PROTIME in the last 168 hours.  No results for input(s): DDIMER in  the last 72 hours.  Cardiac Enzymes No results for input(s): CKMB, TROPONINI, MYOGLOBIN in the last 168 hours.  Invalid input(s): CK ------------------------------------------------------------------------------------------------------------------ No results found for: BNP  Inpatient Medications  Scheduled Meds: . levothyroxine  50 mcg Oral QAC breakfast  . midodrine  2.5 mg Oral QPC breakfast   Continuous Infusions: . sodium chloride 75 mL/hr at 10/14/16 2222  .  piperacillin-tazobactam (ZOSYN)  IV 3.375 g (10/15/16 0610)   PRN Meds:.acetaminophen **OR** acetaminophen, ondansetron **OR** ondansetron (ZOFRAN) IV  Micro Results No results found for this or any previous visit (from the past 240 hour(s)).  Radiology Reports Ct Abdomen Pelvis Wo Contrast  Result Date: 10/13/2016 CLINICAL DATA:  Abdominal pain and diarrhea. EXAM: CT ABDOMEN AND PELVIS WITHOUT CONTRAST TECHNIQUE: Multidetector CT imaging of the abdomen and pelvis was performed following the standard protocol without IV contrast. COMPARISON:  02/15/2015 CT abdomen/pelvis. FINDINGS: Lower chest: Pacer lead is seen in the right ventricular apex. Mild bibasilar scarring versus atelectasis. Hepatobiliary: Normal liver with no liver mass. Cholecystectomy. Bile ducts are within expected post cholecystectomy limits with common bile duct diameter 8 mm. Stable moderate periampullary duodenal diverticulum. Pancreas: Normal, with no mass or duct dilation. Spleen: Normal size. No mass. Adrenals/Urinary Tract: Normal adrenals. Status post left nephrectomy. Peripherally calcified 1.7 cm structure in the left nephrectomy bed is mildly decreased in size since 02/15/2015 and is compatible with benign fat necrosis. No new mass or fluid collection in the left nephrectomy bed. No right hydronephrosis. Simple right renal cysts, largest 3.7 cm in the anterior upper right kidney. No new contour deforming right renal mass. No right renal stones. Normal bladder. Stomach/Bowel: Grossly normal stomach. Normal caliber small bowel with no small bowel wall thickening. Appendix not discretely visualized. No pericecal inflammatory changes. Moderate diffuse colonic diverticulosis, most prominent in the sigmoid colon. There is focal colonic wall thickening in the proximal sigmoid colon with associated mild pericolonic fat stranding, compatible with acute sigmoid diverticulitis. No pericolonic free air or focal fluid collection.  Vascular/Lymphatic: Atherosclerotic nonaneurysmal abdominal aorta. No pathologically enlarged lymph nodes in the abdomen or pelvis. Reproductive: Status post hysterectomy, with no abnormal findings at the vaginal cuff. No adnexal mass. Other: No pneumoperitoneum, ascites or focal fluid collection. Musculoskeletal: No aggressive appearing focal osseous lesions. Moderate thoracolumbar spondylosis. IMPRESSION: 1. Acute sigmoid diverticulitis. No evidence of free air or abscess. 2. Left nephrectomy. No suspicious findings in the left nephrectomy bed . 3. Aortic atherosclerosis. Electronically Signed   By: Ilona Sorrel M.D.   On: 10/13/2016 21:47    Time Spent in minutes  30   [name] M.D on 10/15/2016 at 6:13 AM  Between 7am to 7pm - Pager - [phone]  After 7pm go to [email or web address] - password [facility]  [facility] -  Office  [phone]

## 2016-10-16 DIAGNOSIS — E039 Hypothyroidism, unspecified: Secondary | ICD-10-CM

## 2016-10-16 DIAGNOSIS — K5792 Diverticulitis of intestine, part unspecified, without perforation or abscess without bleeding: Secondary | ICD-10-CM

## 2016-10-16 DIAGNOSIS — K5732 Diverticulitis of large intestine without perforation or abscess without bleeding: Principal | ICD-10-CM

## 2016-10-16 LAB — COMPREHENSIVE METABOLIC PANEL
ALBUMIN: 3.2 g/dL — AB (ref 3.5–5.0)
ALT: 11 U/L — AB (ref 14–54)
ANION GAP: 8 (ref 5–15)
AST: 15 U/L (ref 15–41)
Alkaline Phosphatase: 71 U/L (ref 38–126)
BUN: 14 mg/dL (ref 6–20)
CHLORIDE: 109 mmol/L (ref 101–111)
CO2: 22 mmol/L (ref 22–32)
Calcium: 8.1 mg/dL — ABNORMAL LOW (ref 8.9–10.3)
Creatinine, Ser: 1.42 mg/dL — ABNORMAL HIGH (ref 0.44–1.00)
GFR calc non Af Amer: 32 mL/min — ABNORMAL LOW (ref >60)
GFR, EST AFRICAN AMERICAN: 38 mL/min — AB (ref >60)
Glucose, Bld: 84 mg/dL (ref 65–99)
Potassium: 3.3 mmol/L — ABNORMAL LOW (ref 3.5–5.1)
SODIUM: 139 mmol/L (ref 135–145)
Total Bilirubin: 0.2 mg/dL — ABNORMAL LOW (ref 0.3–1.2)
Total Protein: 6.2 g/dL — ABNORMAL LOW (ref 6.5–8.1)

## 2016-10-16 LAB — CBC
HCT: 45.3 % (ref 36.0–46.0)
Hemoglobin: 14.7 g/dL (ref 12.0–15.0)
MCH: 30.9 pg (ref 26.0–34.0)
MCHC: 32.5 g/dL (ref 30.0–36.0)
MCV: 95.2 fL (ref 78.0–100.0)
Platelets: 180 10*3/uL (ref 150–400)
RBC: 4.76 MIL/uL (ref 3.87–5.11)
RDW: 13.1 % (ref 11.5–15.5)
WBC: 6.2 10*3/uL (ref 4.0–10.5)

## 2016-10-16 MED ORDER — POTASSIUM CHLORIDE CRYS ER 20 MEQ PO TBCR
40.0000 meq | EXTENDED_RELEASE_TABLET | Freq: Once | ORAL | Status: AC
  Administered 2016-10-16: 40 meq via ORAL
  Filled 2016-10-16: qty 2

## 2016-10-16 MED ORDER — AMOXICILLIN-POT CLAVULANATE 875-125 MG PO TABS
1.0000 | ORAL_TABLET | Freq: Two times a day (BID) | ORAL | Status: DC
  Administered 2016-10-17 – 2016-10-18 (×3): 1 via ORAL
  Filled 2016-10-16 (×3): qty 1

## 2016-10-16 NOTE — Progress Notes (Signed)
Patient ID: [name], female   DOB: 01-26-1928, 83 y.o.   MRN: [id number]                                                                PROGRESS NOTE                                                                                                                                                                                                             Patient Demographics:    [name], is a 83 y.o. female, DOB - 02-09-1928, [id number]  Admit date - 10/14/2016   Admitting Physician Rise Patience, MD  Outpatient Primary MD for the patient is [name], MD  LOS - 1  Outpatient Specialists:     Chief Complaint  Patient presents with  . Nausea  . Emesis       Brief Narrative  83 y.o. female with history of breast cancer in remission. We have block status post pacemaker placement, diastolic CHF, hypothyroidism and hyperlipidemia presents to the ER because of persistent nausea vomiting. Patient had come to the ER 48 hours ago and had CT abdomen and pelvis done for abdominal pain. CT showed sigmoid diverticulitis for which patient was initially placed on Cipro and Flagyl following which patient had reaction and was changed to Augmentin. Patient had some food outside this morning following which patient started developing multiple episodes of nausea vomiting. Patient still has a left upper and lower quadrant pain.   ED Course: In the ER patient had further episodes of vomiting. Denies any fever chills or diarrhea. Since patient has persistent vomiting patient is being admitted for IV antibiotics. Patient was mildly hypotensive on admission and was given fluid bolus. Patient states usually runs low blood pressure but this time it was more than usual.   Subjective:    [name] reports feeling better, tolerating clears, wanting to advance diet today   Assessment  & Plan :    Principal Problem:   Sigmoid diverticulitis Active Problems:   Complete heart block  ([place])   Hypothyroidism   Diverticulitis   N/v and acute sigmoid diverticulitis Clear liquids Ns iv Zosyn iv Improving, advance diet as tolerated to full liquids, to soft DC Zosyn 6/20, start augmentin 6/21  Mild hypotension - resolved now Received  ns iv bolus x1  Mild renal insufficiency Improving  Prediabetes Glucose intolerance (hga1c=6.0) Stable  Hypothyroidism Cont synthroid  Hyperlipidemia Cont statin  Heart Block S/p pacer  Hx of breast cancer in remission   DVT prophylaxis: SCDs. Code Status: Full code.  Family Communication: Discussed with patient.  Disposition Plan: Home 6/21.  Consults called: None.   Lab Results  Component Value Date   PLT 180 10/16/2016    Antibiotics  :  Zosyn iv 6/18=>  Anti-infectives    Start     Dose/Rate Route Frequency Ordered Stop   10/17/16 1000  amoxicillin-clavulanate (AUGMENTIN) 875-125 MG per tablet 1 tablet     1 tablet Oral Every 12 hours 10/16/16 1147     10/14/16 2230  piperacillin-tazobactam (ZOSYN) IVPB 3.375 g     3.375 g 12.5 mL/hr over 240 Minutes Intravenous Every 8 hours 10/14/16 2101 10/17/16 0159        Objective:   Vitals:   10/15/16 0600 10/15/16 1508 10/15/16 2025 10/16/16 0359  BP: 102/66 113/89 99/70 115/63  Pulse: 66 (!) 58 (!) 59 66  Resp: 20 20 19 19   Temp: 98 F (36.7 C) 97.8 F (36.6 C) 98 F (36.7 C) 98 F (36.7 C)  TempSrc: Oral Oral Oral Oral  SpO2: 95% 99% 99% 94%    Wt Readings from Last 3 Encounters:  10/13/16 90.9 kg (200 lb 8 oz)  09/19/16 93.9 kg (207 lb)  07/29/16 95.8 kg (211 lb 4 oz)     Intake/Output Summary (Last 24 hours) at 10/16/16 1147 Last data filed at 10/16/16 0900  Gross per 24 hour  Intake             2988 ml  Output              200 ml  Net             2788 ml   Review of systems: as per history, otherwise all reviewed negative  Physical Exam  Awake Alert, Oriented X 3, No new F.N deficits, Normal affect, NCAT,PERRLA Supple Neck,No JVD,  No cervical lymphadenopathy appriciated.  Symmetrical Chest wall movement, Good air movement bilaterally, CTAB RRR,No Gallops,Rubs or new Murmurs, No Parasternal Heave +ve B.Sounds, Abd Soft, No tenderness, No organomegaly appriciated, No rebound - guarding or rigidity. No Cyanosis, Clubbing or edema, No new Rash or bruise      Data Review:    CBC  Recent Labs Lab 10/13/16 1717 10/14/16 1350 10/15/16 0524 10/16/16 0718  WBC 8.8 8.0 6.3 6.2  HGB 14.9 17.1* 13.7 14.7  HCT 45.4 52.0* 44.3 45.3  PLT 217 217 201 180  MCV 95.4 94.5 97.1 95.2  MCH 31.3 31.1 30.0 30.9  MCHC 32.8 32.9 30.9 32.5  RDW 12.8 12.8 13.2 13.1  LYMPHSABS 1.4  --   --   --   MONOABS 1.0  --   --   --   EOSABS 0.4  --   --   --   BASOSABS 0.0  --   --   --     Chemistries   Recent Labs Lab 10/13/16 1717 10/14/16 1350 10/15/16 0524 10/16/16 0718  NA 140 140 143 139  K 3.3* 3.5 3.2* 3.3*  CL 104 104 109 109  CO2 27 22 25 22   GLUCOSE 96 139* 97 84  BUN 18 18 19 14   CREATININE 1.52* 1.52* 1.50* 1.42*  CALCIUM 9.0 9.8 8.1* 8.1*  AST 15 20  --  15  ALT  12* 16  --  11*  ALKPHOS 100 115  --  71  BILITOT 0.8 0.8  --  0.2*   ------------------------------------------------------------------------------------------------------------------ No results for input(s): CHOL, HDL, LDLCALC, TRIG, CHOLHDL, LDLDIRECT in the last 72 hours.  Lab Results  Component Value Date   HGBA1C 6.0 (H) 08/11/2015   ------------------------------------------------------------------------------------------------------------------ No results for input(s): TSH, T4TOTAL, T3FREE, THYROIDAB in the last 72 hours.  Invalid input(s): FREET3 ------------------------------------------------------------------------------------------------------------------ No results for input(s): VITAMINB12, FOLATE, FERRITIN, TIBC, IRON, RETICCTPCT in the last 72 hours.  Coagulation profile No results for input(s): INR, PROTIME in the last 168  hours.  No results for input(s): DDIMER in the last 72 hours.  Cardiac Enzymes No results for input(s): CKMB, TROPONINI, MYOGLOBIN in the last 168 hours.  Invalid input(s): CK ------------------------------------------------------------------------------------------------------------------ No results found for: BNP  Inpatient Medications  Scheduled Meds: . [START ON 10/17/2016] amoxicillin-clavulanate  1 tablet Oral Q12H  . levothyroxine  50 mcg Oral QAC breakfast  . midodrine  2.5 mg Oral QPC breakfast   Continuous Infusions: . piperacillin-tazobactam (ZOSYN)  IV 3.375 g (10/16/16 0930)   PRN Meds:.acetaminophen **OR** acetaminophen, ondansetron **OR** ondansetron (ZOFRAN) IV  Micro Results No results found for this or any previous visit (from the past 240 hour(s)).  Radiology Reports Ct Abdomen Pelvis Wo Contrast  Result Date: 10/13/2016 CLINICAL DATA:  Abdominal pain and diarrhea. EXAM: CT ABDOMEN AND PELVIS WITHOUT CONTRAST TECHNIQUE: Multidetector CT imaging of the abdomen and pelvis was performed following the standard protocol without IV contrast. COMPARISON:  02/15/2015 CT abdomen/pelvis. FINDINGS: Lower chest: Pacer lead is seen in the right ventricular apex. Mild bibasilar scarring versus atelectasis. Hepatobiliary: Normal liver with no liver mass. Cholecystectomy. Bile ducts are within expected post cholecystectomy limits with common bile duct diameter 8 mm. Stable moderate periampullary duodenal diverticulum. Pancreas: Normal, with no mass or duct dilation. Spleen: Normal size. No mass. Adrenals/Urinary Tract: Normal adrenals. Status post left nephrectomy. Peripherally calcified 1.7 cm structure in the left nephrectomy bed is mildly decreased in size since 02/15/2015 and is compatible with benign fat necrosis. No new mass or fluid collection in the left nephrectomy bed. No right hydronephrosis. Simple right renal cysts, largest 3.7 cm in the anterior upper right kidney. No new  contour deforming right renal mass. No right renal stones. Normal bladder. Stomach/Bowel: Grossly normal stomach. Normal caliber small bowel with no small bowel wall thickening. Appendix not discretely visualized. No pericecal inflammatory changes. Moderate diffuse colonic diverticulosis, most prominent in the sigmoid colon. There is focal colonic wall thickening in the proximal sigmoid colon with associated mild pericolonic fat stranding, compatible with acute sigmoid diverticulitis. No pericolonic free air or focal fluid collection. Vascular/Lymphatic: Atherosclerotic nonaneurysmal abdominal aorta. No pathologically enlarged lymph nodes in the abdomen or pelvis. Reproductive: Status post hysterectomy, with no abnormal findings at the vaginal cuff. No adnexal mass. Other: No pneumoperitoneum, ascites or focal fluid collection. Musculoskeletal: No aggressive appearing focal osseous lesions. Moderate thoracolumbar spondylosis. IMPRESSION: 1. Acute sigmoid diverticulitis. No evidence of free air or abscess. 2. Left nephrectomy. No suspicious findings in the left nephrectomy bed . 3. Aortic atherosclerosis. Electronically Signed   By: Ilona Sorrel M.D.   On: 10/13/2016 21:47    Time Spent in minutes  30   Genesi Stefanko M.D on 10/16/2016 at 11:47 AM  Between 7am to 7pm - Pager - [phone]  After 7pm go to [email or web address] - password [facility]  [facility] -  Office  [phone]

## 2016-10-17 DIAGNOSIS — R195 Other fecal abnormalities: Secondary | ICD-10-CM

## 2016-10-17 DIAGNOSIS — R112 Nausea with vomiting, unspecified: Secondary | ICD-10-CM

## 2016-10-17 LAB — BASIC METABOLIC PANEL
ANION GAP: 9 (ref 5–15)
BUN: 10 mg/dL (ref 6–20)
CALCIUM: 8.9 mg/dL (ref 8.9–10.3)
CO2: 25 mmol/L (ref 22–32)
Chloride: 110 mmol/L (ref 101–111)
Creatinine, Ser: 1.1 mg/dL — ABNORMAL HIGH (ref 0.44–1.00)
GFR calc Af Amer: 51 mL/min — ABNORMAL LOW (ref >60)
GFR calc non Af Amer: 44 mL/min — ABNORMAL LOW (ref >60)
GLUCOSE: 80 mg/dL (ref 65–99)
Potassium: 4.4 mmol/L (ref 3.5–5.1)
SODIUM: 144 mmol/L (ref 135–145)

## 2016-10-17 LAB — C DIFFICILE QUICK SCREEN W PCR REFLEX
C DIFFICILE (CDIFF) INTERP: NOT DETECTED
C DIFFICLE (CDIFF) ANTIGEN: NEGATIVE
C Diff toxin: NEGATIVE

## 2016-10-17 MED ORDER — FAMOTIDINE 20 MG PO TABS
20.0000 mg | ORAL_TABLET | Freq: Once | ORAL | Status: AC
  Administered 2016-10-17: 20 mg via ORAL
  Filled 2016-10-17: qty 1

## 2016-10-17 MED ORDER — SODIUM CHLORIDE 0.9 % IV BOLUS (SEPSIS)
500.0000 mL | Freq: Once | INTRAVENOUS | Status: AC
  Administered 2016-10-17: 500 mL via INTRAVENOUS

## 2016-10-17 MED ORDER — DIPHENOXYLATE-ATROPINE 2.5-0.025 MG PO TABS
2.0000 | ORAL_TABLET | Freq: Four times a day (QID) | ORAL | Status: DC
  Administered 2016-10-17 – 2016-10-18 (×5): 2 via ORAL
  Filled 2016-10-17 (×5): qty 2

## 2016-10-17 MED ORDER — OMEPRAZOLE 20 MG PO CPDR: 20.0000 mg | DELAYED_RELEASE_CAPSULE | Freq: Every day | ORAL | 0 refills | Status: DC

## 2016-10-17 MED ORDER — POTASSIUM CHLORIDE CRYS ER 20 MEQ PO TBCR
30.0000 meq | EXTENDED_RELEASE_TABLET | Freq: Two times a day (BID) | ORAL | Status: AC
  Administered 2016-10-17 – 2016-10-18 (×3): 30 meq via ORAL
  Filled 2016-10-17 (×3): qty 1

## 2016-10-17 MED ORDER — AMOXICILLIN-POT CLAVULANATE 875-125 MG PO TABS: 1.0000 | ORAL_TABLET | Freq: Two times a day (BID) | ORAL | 0 refills | Status: AC

## 2016-10-17 MED ORDER — PANTOPRAZOLE SODIUM 40 MG PO TBEC
40.0000 mg | DELAYED_RELEASE_TABLET | Freq: Every day | ORAL | Status: DC
  Administered 2016-10-17 – 2016-10-18 (×2): 40 mg via ORAL
  Filled 2016-10-17 (×2): qty 1

## 2016-10-17 NOTE — Discharge Instructions (Signed)
Seek medical care or return if symptoms worsen, don't improve or new problem develops.   Follow with Primary MD  [name], MD  and other consultant's as instructed your Hospitalist MD  Please get a complete blood count and chemistry panel checked by your Primary MD at your next visit, and again as instructed by your Primary MD.  Get Medicines reviewed and adjusted: Please take all your medications with you for your next visit with your Primary MD  Laboratory/radiological data: Please request your Primary MD to go over all hospital tests and procedure/radiological results at the follow up, please ask your Primary MD to get all Hospital records sent to his/her office.  In some cases, they will be blood work, cultures and biopsy results pending at the time of your discharge. Please request that your primary care M.D. follows up on these results.  Also Note the following: If you experience worsening of your admission symptoms, develop shortness of breath, life threatening emergency, suicidal or homicidal thoughts you must seek medical attention immediately by calling 911 or calling your MD immediately  if symptoms less severe.  You must read complete instructions/literature along with all the possible adverse reactions/side effects for all the Medicines you take and that have been prescribed to you. Take any new Medicines after you have completely understood and accpet all the possible adverse reactions/side effects.   Do not drive when taking Pain medications or sleeping medications (Benzodaizepines)  Do not take more than prescribed Pain, Sleep and Anxiety Medications. It is not advisable to combine anxiety,sleep and pain medications without talking with your primary care practitioner  Special Instructions: If you have smoked or chewed Tobacco  in the last 2 yrs please stop smoking, stop any regular Alcohol  and or any Recreational drug use.  Wear Seat belts while driving.  Please  note: You were cared for by a hospitalist during your hospital stay. Once you are discharged, your primary care physician will handle any further medical issues. Please note that NO REFILLS for any discharge medications will be authorized once you are discharged, as it is imperative that you return to your primary care physician (or establish a relationship with a primary care physician if you do not have one) for your post hospital discharge needs so that they can reassess your need for medications and monitor your lab values.      Diverticulitis Diverticulitis is when small pockets in your large intestine (colon) get infected or swollen. This causes stomach pain and watery poop (diarrhea). These pouches are called diverticula. They form in people who have a condition called diverticulosis. Follow these instructions at home: Medicines  Take over-the-counter and prescription medicines only as told by your doctor. These include: ? Antibiotics. ? Pain medicines. ? Fiber pills. ? Probiotics. ? Stool softeners.  Do not drive or use heavy machinery while taking prescription pain medicine.  If you were prescribed an antibiotic, take it as told. Do not stop taking it even if you feel better. General instructions  Follow a diet as told by your doctor.  When you feel better, your doctor may tell you to change your diet. You may need to eat a lot of fiber. Fiber makes it easier to poop (have bowel movements). Healthy foods with fiber include: ? Berries. ? Beans. ? Lentils. ? Green vegetables.  Exercise 3 or more times a week. Aim for 30 minutes each time. Exercise enough to sweat and make your heart beat faster.  Keep all follow-up  visits as told. This is important. You may need to have an exam of the large intestine. This is called a colonoscopy. Contact a doctor if:  Your pain does not get better.  You have a hard time eating or drinking.  You are not pooping like normal. Get help  right away if:  Your pain gets worse.  Your problems do not get better.  Your problems get worse very fast.  You have a fever.  You throw up (vomit) more than one time.  You have poop that is: ? Bloody. ? Black. ? Tarry. Summary  Diverticulitis is when small pockets in your large intestine (colon) get infected or swollen.  Take medicines only as told by your doctor.  Follow a diet as told by your doctor. This information is not intended to replace advice given to you by your health care provider. Make sure you discuss any questions you have with your health care provider. Document Released: 10/02/2007 Document Revised: 05/02/2016 Document Reviewed: 05/02/2016 Elsevier Interactive Patient Education  2017 [employer].

## 2016-10-17 NOTE — Discharge Summary (Addendum)
Physician Discharge Summary  [name] NWG:[id number] DOB: 1927-06-20 DOA: 10/14/2016  PCP: [name], MD  Admit date: 10/14/2016 Discharge date: 10/18/2016  Admitted From: Home  Disposition: Home   Recommendations for Outpatient Follow-up:  1. Follow up with PCP in 1 weeks 2. Please obtain BMP/CBC in one week  Home Health: YES PT, RN, SW, Aide  Discharge Condition: Stable  CODE STATUS: Full   Diet recommendation: soft   Brief/Interim Summary: HPI: [name] is a 83 y.o. female with history of breast cancer in remission. We have block status post pacemaker placement, diastolic CHF, hypothyroidism and hyperlipidemia presents to the ER because of persistent nausea vomiting. Patient had come to the ER 48 hours ago and had CT abdomen and pelvis done for abdominal pain. CT showed sigmoid diverticulitis for which patient was initially placed on Cipro and Flagyl following which patient had reaction and was changed to Augmentin. Patient had some food outside this morning following which patient started developing multiple episodes of nausea vomiting. Patient still has a left upper and lower quadrant pain.   ED Course: In the ER patient had further episodes of vomiting. Denies any fever chills or diarrhea. Since patient has persistent vomiting patient is being admitted for IV antibiotics. Patient was mildly hypotensive on admission and was given fluid bolus. Patient states usually runs low blood pressure but this time it was more than usual.  Acute sigmoid diverticulitis Clinically much improved and tolerating diet, I'm recommending she continue a soft diet for next 1-2 weeks with lots of clears.   Discharge on 4 more days of augmentin.  Close follow up with PCP recommended.  No abdominal pain.    Mild hypotension - resolved now Resolved after IVF hydration.   Mild renal insufficiency Much improved with hydration   Prediabetes Glucose intolerance (hga1c=6.0) Stable, follow  up with PCP  Hypothyroidism Cont synthroid  Hyperlipidemia Cont statin  Heart Block S/p pacer  Hx of breast cancer in remission   DVT prophylaxis:SCDs. Code Status:Full code. Family Communication:Discussed with patient. Disposition Plan:Home 6/21 with HHPT, RN, SW, Aide Consults called:None.   Discharge Diagnoses:  Principal Problem:   Sigmoid diverticulitis Active Problems:   Complete heart block [facility])   Hypothyroidism   Diverticulitis  Discharge Instructions  Discharge Instructions    Increase activity slowly    Complete by:  As directed      Allergies as of 10/18/2016      Reactions   Erythromycin Hives   Tape Other (See Comments)   BAND AIDS-SKIN IRRITATION   Codeine Nausea And Vomiting   Flagyl [metronidazole] Hives   Macrobid [nitrofurantoin Macrocrystal] Nausea And Vomiting   Tolterodine Nausea And Vomiting   Ciprofloxacin Hives, Rash      Medication List    TAKE these medications   amoxicillin-clavulanate 875-125 MG tablet Commonly known as:  AUGMENTIN Take 1 tablet by mouth 2 (two) times daily. One po bid x 7 days What changed:  additional instructions   aspirin EC 81 MG tablet Take 81 mg by mouth daily before breakfast.   CALTRATE 600+D PO Take 600 mg by mouth daily after breakfast.   furosemide 20 MG tablet Commonly known as:  LASIX Take 20 mg by mouth daily with breakfast.   letrozole 2.5 MG tablet Commonly known as:  FEMARA Take 1 tablet (2.5 mg total) by mouth daily. What changed:  when to take this   levothyroxine 50 MCG tablet Commonly known as:  SYNTHROID, LEVOTHROID Take 50 mcg by  mouth daily before breakfast.   meclizine 12.5 MG tablet Commonly known as:  ANTIVERT Take 1 tablet (12.5 mg total) by mouth 3 (three) times daily as needed for dizziness.   midodrine 2.5 MG tablet Commonly known as:  PROAMATINE Take 2.5 mg by mouth daily after breakfast.   omeprazole 20 MG capsule Commonly known as:   PRILOSEC Take 1 capsule (20 mg total) by mouth daily.   ondansetron 4 MG disintegrating tablet Commonly known as:  ZOFRAN ODT 4mg  ODT q4 hours prn nausea/vomit What changed:  how much to take  how to take this  when to take this  reasons to take this  additional instructions   OVER THE COUNTER MEDICATION Place 1 drop into both eyes daily as needed (dry eyes). Over the counter lubricating eye drop   PROBIOTIC PO Take 1 tablet by mouth daily with breakfast.            Durable Medical Equipment        Start     Ordered   10/17/16 1432  For home use only DME 3 n 1  Once     10/17/16 1432     Follow-up Information    [name], MD. Schedule an appointment as soon as possible for a visit in 1 week(s).   Specialty:  Internal Medicine Why:  Hospital Follow Up  Contact information: [address] [place] [address] [phone]          Allergies  Allergen Reactions  . Erythromycin Hives  . Tape Other (See Comments)    BAND AIDS-SKIN IRRITATION  . Codeine Nausea And Vomiting  . Flagyl [Metronidazole] Hives  . Macrobid [Nitrofurantoin Macrocrystal] Nausea And Vomiting  . Tolterodine Nausea And Vomiting  . Ciprofloxacin Hives and Rash   Procedures/Studies: Ct Abdomen Pelvis Wo Contrast  Result Date: 10/13/2016 CLINICAL DATA:  Abdominal pain and diarrhea. EXAM: CT ABDOMEN AND PELVIS WITHOUT CONTRAST TECHNIQUE: Multidetector CT imaging of the abdomen and pelvis was performed following the standard protocol without IV contrast. COMPARISON:  02/15/2015 CT abdomen/pelvis. FINDINGS: Lower chest: Pacer lead is seen in the right ventricular apex. Mild bibasilar scarring versus atelectasis. Hepatobiliary: Normal liver with no liver mass. Cholecystectomy. Bile ducts are within expected post cholecystectomy limits with common bile duct diameter 8 mm. Stable moderate periampullary duodenal diverticulum. Pancreas: Normal, with no mass or duct dilation.  Spleen: Normal size. No mass. Adrenals/Urinary Tract: Normal adrenals. Status post left nephrectomy. Peripherally calcified 1.7 cm structure in the left nephrectomy bed is mildly decreased in size since 02/15/2015 and is compatible with benign fat necrosis. No new mass or fluid collection in the left nephrectomy bed. No right hydronephrosis. Simple right renal cysts, largest 3.7 cm in the anterior upper right kidney. No new contour deforming right renal mass. No right renal stones. Normal bladder. Stomach/Bowel: Grossly normal stomach. Normal caliber small bowel with no small bowel wall thickening. Appendix not discretely visualized. No pericecal inflammatory changes. Moderate diffuse colonic diverticulosis, most prominent in the sigmoid colon. There is focal colonic wall thickening in the proximal sigmoid colon with associated mild pericolonic fat stranding, compatible with acute sigmoid diverticulitis. No pericolonic free air or focal fluid collection. Vascular/Lymphatic: Atherosclerotic nonaneurysmal abdominal aorta. No pathologically enlarged lymph nodes in the abdomen or pelvis. Reproductive: Status post hysterectomy, with no abnormal findings at the vaginal cuff. No adnexal mass. Other: No pneumoperitoneum, ascites or focal fluid collection. Musculoskeletal: No aggressive appearing focal osseous lesions. Moderate thoracolumbar spondylosis. IMPRESSION: 1. Acute sigmoid  diverticulitis. No evidence of free air or abscess. 2. Left nephrectomy. No suspicious findings in the left nephrectomy bed . 3. Aortic atherosclerosis. Electronically Signed   By: Ilona Sorrel M.D.   On: 10/13/2016 21:47     Subjective: Pt feeling better, less loose stool. Tolerating diet.      Discharge Exam: Vitals:   10/17/16 2134 10/18/16 0629  BP: 101/63 103/72  Pulse: 64 63  Resp: 18 18  Temp: 97.5 F (36.4 C) 98.4 F (36.9 C)   Vitals:   10/17/16 0505 10/17/16 1441 10/17/16 2134 10/18/16 0629  BP: 107/66 110/68 101/63  103/72  Pulse: 65 65 64 63  Resp: 16 18 18 18   Temp: 98 F (36.7 C) 98 F (36.7 C) 97.5 F (36.4 C) 98.4 F (36.9 C)  TempSrc: Oral Oral Oral   SpO2: 100% 100% 99% 95%    General: Pt is alert, awake, not in acute distress Cardiovascular: RRR, S1/S2 +, no rubs, no gallops Respiratory: CTA bilaterally, no wheezing, no rhonchi Abdominal: Soft, NT, ND, bowel sounds + Extremities: no edema, no cyanosis  The results of significant diagnostics from this hospitalization (including imaging, microbiology, ancillary and laboratory) are listed below for reference.     Microbiology: Recent Results (from the past 240 hour(s))  C difficile quick scan w PCR reflex     Status: None   Collection Time: 10/17/16  3:15 PM  Result Value Ref Range Status   C Diff antigen NEGATIVE NEGATIVE Final   C Diff toxin NEGATIVE NEGATIVE Final   C Diff interpretation No C. difficile detected.  Final     Labs: BNP (last 3 results) No results for input(s): BNP in the last 8760 hours. Basic Metabolic Panel:  Recent Labs Lab 10/14/16 1350 10/15/16 0524 10/16/16 0718 10/17/16 0841 10/18/16 0726  NA 140 143 139 144 142  K 3.5 3.2* 3.3* 4.4 4.5  CL 104 109 109 110 113*  CO2 22 25 22 25 22   GLUCOSE 139* 97 84 80 73  BUN 18 19 14 10 8   CREATININE 1.52* 1.50* 1.42* 1.10* 1.08*  CALCIUM 9.8 8.1* 8.1* 8.9 8.4*   Liver Function Tests:  Recent Labs Lab 10/13/16 1717 10/14/16 1350 10/16/16 0718  AST 15 20 15   ALT 12* 16 11*  ALKPHOS 100 115 71  BILITOT 0.8 0.8 0.2*  PROT 6.7 7.5 6.2*  ALBUMIN 3.5 4.3 3.2*    Recent Labs Lab 10/14/16 1350  LIPASE 44   No results for input(s): AMMONIA in the last 168 hours. CBC:  Recent Labs Lab 10/13/16 1717 10/14/16 1350 10/15/16 0524 10/16/16 0718  WBC 8.8 8.0 6.3 6.2  NEUTROABS 5.9  --   --   --   HGB 14.9 17.1* 13.7 14.7  HCT 45.4 52.0* 44.3 45.3  MCV 95.4 94.5 97.1 95.2  PLT 217 217 201 180   Cardiac Enzymes: No results for input(s):  CKTOTAL, CKMB, CKMBINDEX, TROPONINI in the last 168 hours. BNP: Invalid input(s): POCBNP CBG: No results for input(s): GLUCAP in the last 168 hours. D-Dimer No results for input(s): DDIMER in the last 72 hours. Hgb A1c No results for input(s): HGBA1C in the last 72 hours. Lipid Profile No results for input(s): CHOL, HDL, LDLCALC, TRIG, CHOLHDL, LDLDIRECT in the last 72 hours. Thyroid function studies No results for input(s): TSH, T4TOTAL, T3FREE, THYROIDAB in the last 72 hours.  Invalid input(s): FREET3 Anemia work up No results for input(s): VITAMINB12, FOLATE, FERRITIN, TIBC, IRON, RETICCTPCT in the last 72 hours.  Urinalysis    Component Value Date/Time   COLORURINE YELLOW 05/05/2016 0246   APPEARANCEUR CLEAR 05/05/2016 0246   LABSPEC 1.017 05/05/2016 0246   PHURINE 5.0 05/05/2016 0246   GLUCOSEU NEGATIVE 05/05/2016 0246   HGBUR NEGATIVE 05/05/2016 0246   BILIRUBINUR NEGATIVE 05/05/2016 0246   KETONESUR NEGATIVE 05/05/2016 0246   PROTEINUR NEGATIVE 05/05/2016 0246   UROBILINOGEN 0.2 04/12/2014 1928   NITRITE NEGATIVE 05/05/2016 0246   LEUKOCYTESUR MODERATE (A) 05/05/2016 0246   Sepsis Labs Invalid input(s): PROCALCITONIN,  WBC,  LACTICIDVEN Microbiology Recent Results (from the past 240 hour(s))  C difficile quick scan w PCR reflex     Status: None   Collection Time: 10/17/16  3:15 PM  Result Value Ref Range Status   C Diff antigen NEGATIVE NEGATIVE Final   C Diff toxin NEGATIVE NEGATIVE Final   C Diff interpretation No C. difficile detected.  Final   Time coordinating discharge: 33 mins  SIGNED:  Irwin Brakeman, MD  [facility] 10/18/2016, 2:38 PM Pager [phone]  If 7PM-7AM, please contact night-coverage [email or web address] Password [email or web address]

## 2016-10-17 NOTE — Progress Notes (Signed)
Pt vomited approximately 250cc emesis after eating saltine crackers.  4mg  Zofran IV given and bolus started per order.  MD notified.  Will continue to monitor.  Eliezer [place]

## 2016-10-17 NOTE — Consult Note (Signed)
[facility] CM Primary Care Navigator  10/17/2016  [name] 1927-05-06 [id number]   Met with patient and friend ([name]) at the bedside to identify possible discharge needs. Patient reports having persistent nausea/ vomiting and left abdominal pain that had led to this admission.  Patient endorses Dr. [name] with [facility] as her primary care provider.  Patient states using [facility] service and [facility] to obtain medications without any problem. She reports doing her own medication management at home straight out of the containers with no difficulty. Patient was driving prior to admission. She states that her friend [name]) will provide transportation to her doctors' appointments after discharge.   Patient lives alone and depends on her friends to stay at her home to assist with care as needed. Friend at the bedside states that patient will be assisted for few hours in a day for her care needs.  Anticipated discharge plan is home per MD note.  Patient expressed understanding to call primary care provider's office when she returns home to schedule a post discharge follow-up appointment within a week or sooner if needed.  Patient letter (with PCP's contact number) was provided as a reminder.  Explained to patient about [facility] care management services available. Patient denies any concerns or health management needs at this time Encouraged patient to requestreferral to [place] from primary care provider if deemed necessary for it.  [facility] care management information provided for any future needs that may arise.   For questions, please contact:  Dannielle Huh, BSN, RN- [facility] Primary Care Navigator  Telephone: [phone] [place]

## 2016-10-17 NOTE — Care Management Note (Signed)
Case Management Note  Patient Details  Name: [name] MRN: [id number] Date of Birth: May 20, 1927  Subjective/Objective:                    Action/Plan:  Discussed discharge planning with patient at bedside. Patient confirmed face sheet information. Patient already has walker at home but would like 3 in 1 . 3 in 1 ordered. Patient lives alone however she has a friend who is going to stay with her 24/7 . Patient has transportation home.  Expected Discharge Date:  10/17/16               Expected Discharge Plan:  [place]  In-House Referral:     Discharge planning Services  CM Consult  Post Acute Care Choice:  Durable Medical Equipment, Home Health Choice offered to:  Patient  DME Arranged:  3-N-1 DME Agency:  [place]:  RN, PT, Social Work, Nurse's Aide [place] Agency:  [place]  Status of Service:  Completed, signed off  If discussed at [employer], dates discussed:    Additional Comments:  Marilu Favre, RN 10/17/2016, 2:33 PM

## 2016-10-17 NOTE — Evaluation (Signed)
Physical Therapy Evaluation Patient Details Name: [name] MRN: [id number] DOB: [birth year] Today's Date: 10/17/2016   History of Present Illness  Pt is an 83 y/o female admitted secondary to an adverse reaction from medication she was taking for sigmoid diverticulitis. PMH includes CHF, orthostatic hypotension, complete heart block s/p pacemaker insertion, bilat breast cancer s/p bilat breast lumpectomy, venous insufficiency with varicose veins in BLE, osteopenia, and delusional disorder.   Clinical Impression  Pt admitted secondary to problem above with deficits below. PTA, pt was living alone and using RW for mobility. Reports inside the house she didn't use RW if walking short distance. Upon evaluation, pt limited by weakness and decreased balance. Attempted gait without AD, however, pt unsteady and "furniture walking." Increased steadiness with use of RW, so educated pt to use RW at all times upon return home. Performed stair training this session as well. Min guard to supervision required for all mobility tasks. Pt reports friend can provide 24/7 support initially. Recommending HHPT at d/c to address balance/strength deficits and for home safety eval, to increase independence with functional mobility. Will continue to follow acutely.     Follow Up Recommendations Home health PT;Supervision/Assistance - 24 hour (initially )    Equipment Recommendations  None recommended by PT    Recommendations for Other Services       Precautions / Restrictions Precautions Precautions: Fall Precaution Comments: history of falls; last one 6 months ago Restrictions Weight Bearing Restrictions: No      Mobility  Bed Mobility               General bed mobility comments: Up with mobility tech upon entry   Transfers Overall transfer level: Needs assistance Equipment used: Standard walker Transfers: Sit to/from Stand Sit to Stand: Supervision         General transfer comment:  Supervision for safety   Ambulation/Gait Ambulation/Gait assistance: Min guard;Supervision Ambulation Distance (Feet): 150 Feet Assistive device: Rolling walker (2 wheeled);None Gait Pattern/deviations: Step-through pattern;Decreased stride length;Trunk flexed;Wide base of support Gait velocity: Decreased Gait velocity interpretation: Below normal speed for age/gender General Gait Details: Slow gait speed; unsteady without use of AD. Increased steadiness with use of RW. Verbal cues for upright posture. Educated to use RW at home to increase stability, even for short distances that she normally goes without AD.   Stairs Stairs: Yes Stairs assistance: Min guard Stair Management: One rail Left;Step to pattern;Sideways Number of Stairs: 3 General stair comments: side step using BUE on L rail for stair navigation. Min guard for safety. Pt reports she uses this technique at home. Pt initially far from rail and requiring cues to step closer.   Wheelchair Mobility    Modified Rankin (Stroke Patients Only)       Balance Overall balance assessment: Needs assistance;History of Falls Sitting-balance support: No upper extremity supported;Feet supported Sitting balance-Leahy Scale: Good     Standing balance support: Bilateral upper extremity supported;During functional activity Standing balance-Leahy Scale: Poor Standing balance comment: Reliant on RW for support                              Pertinent Vitals/Pain Pain Assessment: No/denies pain    Home Living Family/patient expects to be discharged to:: Private residence Living Arrangements: Alone Available Help at Discharge: Friend(s);Available 24 hours/day (first few days ) Type of Home: House Home Access: Stairs to enter Entrance Stairs-Rails: Left Entrance Stairs-Number of Steps: 4 Home Layout: One  level Home Equipment: Walker - 2 wheels      Prior Function Level of Independence: Independent with assistive  device(s)         Comments: Used RW and still drives short distances     Hand Dominance   Dominant Hand: Right    Extremity/Trunk Assessment   Upper Extremity Assessment Upper Extremity Assessment: Generalized weakness    Lower Extremity Assessment Lower Extremity Assessment: Generalized weakness (sensory in tact. Grossly 4-/5 throughout. )    Cervical / Trunk Assessment Cervical / Trunk Assessment: Kyphotic  Communication   Communication: No difficulties  Cognition Arousal/Alertness: Awake/alert Behavior During Therapy: WFL for tasks assessed/performed Overall Cognitive Status: Within Functional Limits for tasks assessed                                        General Comments General comments (skin integrity, edema, etc.): Pt reporting her friend can come stay with her for a couple days. Educated pt to have friend come stay to ensure safety with mobility at home. Educated pt about HHPT for balance deficits, pt agreeable. Educated about wearing proper footwear at home to decrease risk for falls.     Exercises     Assessment/Plan    PT Assessment Patient needs continued PT services  PT Problem List Decreased strength;Decreased balance;Decreased mobility;Decreased knowledge of use of DME;Decreased knowledge of precautions       PT Treatment Interventions Gait training;DME instruction;Stair training;Functional mobility training;Therapeutic exercise;Therapeutic activities;Balance training;Neuromuscular re-education;Patient/family education    PT Goals (Current goals can be found in the Care Plan section)  Acute Rehab PT Goals Patient Stated Goal: to go home  PT Goal Formulation: With patient Time For Goal Achievement: 10/24/16 Potential to Achieve Goals: Good    Frequency Min 3X/week   Barriers to discharge Decreased caregiver support Lives alone, but friend available to stay with her initially     Co-evaluation               AM-PAC PT  "6 Clicks" Daily Activity  Outcome Measure Difficulty turning over in bed (including adjusting bedclothes, sheets and blankets)?: A Little Difficulty moving from lying on back to sitting on the side of the bed? : A Lot Difficulty sitting down on and standing up from a chair with arms (e.g., wheelchair, bedside commode, etc,.)?: A Little Help needed moving to and from a bed to chair (including a wheelchair)?: A Little Help needed walking in hospital room?: A Little Help needed climbing 3-5 steps with a railing? : A Little 6 Click Score: 17    End of Session Equipment Utilized During Treatment: Gait belt Activity Tolerance: Patient tolerated treatment well Patient left: in chair;with call bell/phone within reach;with nursing/sitter in room;with family/visitor present Nurse Communication: Mobility status PT Visit Diagnosis: Unsteadiness on feet (R26.81)    Time: 0300-9233 PT Time Calculation (min) (ACUTE ONLY): 21 min   Charges:   PT Evaluation $PT Eval Low Complexity: 1 Procedure PT Treatments $Gait Training: 8-22 mins   PT G Codes:        Leighton Ruff, PT, DPT  Acute Rehabilitation Services  Pager: [phone]   Rudean Hitt 10/17/2016, 1:52 PM

## 2016-10-17 NOTE — Progress Notes (Signed)
10/17/2016  3:18 PM  RN reporting patient unable to discharge due to watery diarrhea.  Will hold discharge, check stool for c diff, enteric precautions, lomotil.   Murvin Natal MD

## 2016-10-18 LAB — BASIC METABOLIC PANEL
ANION GAP: 7 (ref 5–15)
BUN: 8 mg/dL (ref 6–20)
CO2: 22 mmol/L (ref 22–32)
Calcium: 8.4 mg/dL — ABNORMAL LOW (ref 8.9–10.3)
Chloride: 113 mmol/L — ABNORMAL HIGH (ref 101–111)
Creatinine, Ser: 1.08 mg/dL — ABNORMAL HIGH (ref 0.44–1.00)
GFR calc Af Amer: 52 mL/min — ABNORMAL LOW (ref >60)
GFR, EST NON AFRICAN AMERICAN: 45 mL/min — AB (ref >60)
GLUCOSE: 73 mg/dL (ref 65–99)
POTASSIUM: 4.5 mmol/L (ref 3.5–5.1)
Sodium: 142 mmol/L (ref 135–145)

## 2016-10-18 NOTE — Progress Notes (Signed)
10/18/2016 2:37 PM  Pt was seen and examined, feeling much better, much less loose stool, feels strong enough to discharge home.  Will discharge today.  Follow up with Dr. Azzie Roup MD

## 2016-10-18 NOTE — Evaluation (Signed)
Occupational Therapy Evaluation Patient Details Name: [name] MRN: [id number] DOB: Sep 20, 1927 Today's Date: 10/18/2016    History of Present Illness Pt is an 83 y/o female admitted secondary to an adverse reaction from medication she was taking for sigmoid diverticulitis. PMH includes CHF, orthostatic hypotension, complete heart block s/p pacemaker insertion, bilat breast cancer s/p bilat breast lumpectomy, venous insufficiency with varicose veins in BLE, osteopenia, and delusional disorder.    Clinical Impression   Pt is at supervision level with ADLs and ADL mobility using RW and will have sup and assist prn at home from a friend for a few days after acute d/c. No further acute OT is indicated at this time    Follow Up Recommendations  No OT follow up;Supervision - Intermittent    Equipment Recommendations  Toilet riser;Tub/shower seat    Recommendations for Other Services       Precautions / Restrictions Precautions Precautions: Fall Precaution Comments: history of falls; last one 6 months ago Restrictions Weight Bearing Restrictions: No      Mobility Bed Mobility               General bed mobility comments: pt up in recliner upon OT arrival  Transfers Overall transfer level: Needs assistance Equipment used: Standard walker Transfers: Sit to/from Stand Sit to Stand: Supervision         General transfer comment: Supervision for safety     Balance Overall balance assessment: Needs assistance;History of Falls Sitting-balance support: No upper extremity supported;Feet supported       Standing balance support: During functional activity;Single extremity supported;Bilateral upper extremity supported;No upper extremity supported Standing balance-Leahy Scale: Poor Standing balance comment: Reliant on RW for support                            ADL either performed or assessed with clinical judgement   ADL Overall ADL's : Needs  assistance/impaired     Grooming: Wash/dry hands;Wash/dry face;Brushing hair;Oral care;Standing;Supervision/safety   Upper Body Bathing: Set up   Lower Body Bathing: Supervison/ safety   Upper Body Dressing : Set up   Lower Body Dressing: Supervision/safety   Toilet Transfer: Supervision/safety;RW;Ambulation;Comfort height toilet;Grab bars   Toileting- Clothing Manipulation and Hygiene: Supervision/safety   Tub/ Shower Transfer: Supervision/safety;3 in 1;Grab bars;Rolling walker;Ambulation   Functional mobility during ADLs: Supervision/safety       Vision Baseline Vision/History: Wears glasses Wears Glasses: Reading only Patient Visual Report: No change from baseline                  Pertinent Vitals/Pain Pain Assessment: No/denies pain     Hand Dominance Right   Extremity/Trunk Assessment Upper Extremity Assessment Upper Extremity Assessment: Overall WFL for tasks assessed   Lower Extremity Assessment Lower Extremity Assessment: Defer to PT evaluation   Cervical / Trunk Assessment Cervical / Trunk Assessment: Kyphotic   Communication Communication Communication: No difficulties   Cognition Arousal/Alertness: Awake/alert Behavior During Therapy: WFL for tasks assessed/performed Overall Cognitive Status: Within Functional Limits for tasks assessed                                     General Comments   pt very pleasant and cooperative               Home Living Family/patient expects to be discharged to:: Private residence Living Arrangements: Alone Available Help at  Discharge: Friend(s);Available 24 hours/day Type of Home: House Home Access: Stairs to enter [employer] of Steps: 4 Entrance Stairs-Rails: Left Home Layout: One level     Bathroom Shower/Tub: Occupational psychologist: Standard     Home Equipment: Environmental consultant - 2 wheels          Prior Functioning/Environment Level of Independence: Independent  with assistive device(s)        Comments: Used RW and still drives short distances, independent with ADLs (bathes at sink), light home mgt, grocery shopping        OT Problem List: Impaired balance (sitting and/or standing);Decreased activity tolerance      OT Treatment/Interventions:      OT Goals(Current goals can be found in the care plan section) Acute Rehab OT Goals Patient Stated Goal: to go home  OT Goal Formulation: With patient  OT Frequency:     Barriers to D/C:    no barriers                     AM-PAC PT "6 Clicks" Daily Activity     Outcome Measure Help from another person eating meals?: None Help from another person taking care of personal grooming?: None Help from another person toileting, which includes using toliet, bedpan, or urinal?: None Help from another person bathing (including washing, rinsing, drying)?: A Little Help from another person to put on and taking off regular upper body clothing?: None Help from another person to put on and taking off regular lower body clothing?: A Little 6 Click Score: 22   End of Session Equipment Utilized During Treatment: Rolling walker;Other (comment) (3 in 1)  Activity Tolerance: Patient tolerated treatment well Patient left: in chair  OT Visit Diagnosis: History of falling (Z91.81);Unsteadiness on feet (R26.81)                Time: 1829-9371 OT Time Calculation (min): 25 min Charges:  OT General Charges $OT Visit: 1 Procedure OT Treatments $Therapeutic Activity: 8-22 mins G-Codes: OT G-codes **NOT FOR INPATIENT CLASS** Functional Assessment Tool Used: AM-PAC 6 Clicks Daily Activity     Britt Bottom 10/18/2016, 10:37 AM

## 2016-10-18 NOTE — Progress Notes (Signed)
[name] to be D/C'd  per MD order. Discussed with the patient and all questions fully answered.  VSS, Skin clean, dry and intact without evidence of skin break down, no evidence of skin tears noted.  IV catheter discontinued intact. Site without signs and symptoms of complications. Dressing and pressure applied.  An After Visit Summary was printed and given to the patient. Patient received prescription.  D/c education completed with patient/family including follow up instructions, medication list, d/c activities limitations if indicated, with other d/c instructions as indicated by MD - patient able to verbalize understanding, all questions fully answered.   Patient instructed to return to ED, call 911, or call MD for any changes in condition.   Patient to be escorted via [place], and D/C home via private auto.

## 2016-10-18 NOTE — Care Management Important Message (Signed)
Important Message  Patient Details  Name: [name] MRN: [id number] Date of Birth: 24-Aug-1927   Medicare Important Message Given:  Yes    [name] [name] 10/18/2016, 12:51 PM

## 2016-10-18 NOTE — Progress Notes (Signed)
PT Cancellation Note  Patient Details Name: JUDYTH DEMARAIS MRN: [id number] DOB: 1927/12/10   Cancelled Treatment:    Reason Eval/Treat Not Completed: Other (comment). On arrival to pt's room, RN advised pt discharging home and did not need to be seen by PT.   Benjiman Core, PTA Pager [phone] Acute Rehab  Allena Katz 10/18/2016, 2:56 PM

## 2016-10-24 DIAGNOSIS — K5792 Diverticulitis of intestine, part unspecified, without perforation or abscess without bleeding: Secondary | ICD-10-CM | POA: Diagnosis not present

## 2016-10-24 DIAGNOSIS — Z09 Encounter for follow-up examination after completed treatment for conditions other than malignant neoplasm: Secondary | ICD-10-CM | POA: Diagnosis not present

## 2016-10-28 ENCOUNTER — Ambulatory Visit (INDEPENDENT_AMBULATORY_CARE_PROVIDER_SITE_OTHER): Payer: Medicare Other | Admitting: *Deleted

## 2016-10-28 DIAGNOSIS — I442 Atrioventricular block, complete: Secondary | ICD-10-CM | POA: Diagnosis not present

## 2016-10-28 NOTE — Progress Notes (Signed)
Remote pacemaker transmission.   

## 2016-10-29 ENCOUNTER — Encounter: Payer: Self-pay | Admitting: Cardiology

## 2016-10-29 LAB — CUP PACEART REMOTE DEVICE CHECK
Brady Statistic RV Percent Paced: 100 %
Implantable Lead Model: 4076
Lead Channel Impedance Value: 420 Ohm
Lead Channel Pacing Threshold Amplitude: 1.125 V
Lead Channel Setting Pacing Amplitude: 2.25 V
Lead Channel Setting Pacing Pulse Width: 0.4 ms
MDC IDC LEAD IMPLANT DT: 20141208
MDC IDC LEAD LOCATION: 753860
MDC IDC MSMT BATTERY IMPEDANCE: 356 Ohm
MDC IDC MSMT BATTERY REMAINING LONGEVITY: 92 mo
MDC IDC MSMT BATTERY VOLTAGE: 2.78 V
MDC IDC MSMT LEADCHNL RA IMPEDANCE VALUE: 0 Ohm
MDC IDC MSMT LEADCHNL RV PACING THRESHOLD PULSEWIDTH: 0.4 ms
MDC IDC PG IMPLANT DT: 20161027
MDC IDC SESS DTM: 20180702123055
MDC IDC SET LEADCHNL RV SENSING SENSITIVITY: 2 mV

## 2016-11-25 DIAGNOSIS — K5792 Diverticulitis of intestine, part unspecified, without perforation or abscess without bleeding: Secondary | ICD-10-CM | POA: Diagnosis not present

## 2016-11-25 DIAGNOSIS — R197 Diarrhea, unspecified: Secondary | ICD-10-CM | POA: Diagnosis not present

## 2016-11-25 DIAGNOSIS — E11621 Type 2 diabetes mellitus with foot ulcer: Secondary | ICD-10-CM | POA: Diagnosis not present

## 2016-11-25 DIAGNOSIS — E039 Hypothyroidism, unspecified: Secondary | ICD-10-CM | POA: Diagnosis not present

## 2016-12-24 DIAGNOSIS — H401132 Primary open-angle glaucoma, bilateral, moderate stage: Secondary | ICD-10-CM | POA: Diagnosis not present

## 2016-12-24 DIAGNOSIS — H40013 Open angle with borderline findings, low risk, bilateral: Secondary | ICD-10-CM | POA: Diagnosis not present

## 2016-12-24 DIAGNOSIS — H43811 Vitreous degeneration, right eye: Secondary | ICD-10-CM | POA: Diagnosis not present

## 2016-12-24 DIAGNOSIS — Z961 Presence of intraocular lens: Secondary | ICD-10-CM | POA: Diagnosis not present

## 2017-01-24 DIAGNOSIS — E039 Hypothyroidism, unspecified: Secondary | ICD-10-CM | POA: Diagnosis not present

## 2017-01-24 DIAGNOSIS — E789 Disorder of lipoprotein metabolism, unspecified: Secondary | ICD-10-CM | POA: Diagnosis not present

## 2017-01-24 DIAGNOSIS — R739 Hyperglycemia, unspecified: Secondary | ICD-10-CM | POA: Diagnosis not present

## 2017-01-27 ENCOUNTER — Ambulatory Visit (INDEPENDENT_AMBULATORY_CARE_PROVIDER_SITE_OTHER): Payer: Medicare Other | Admitting: *Deleted

## 2017-01-27 DIAGNOSIS — I442 Atrioventricular block, complete: Secondary | ICD-10-CM | POA: Diagnosis not present

## 2017-01-28 LAB — CUP PACEART REMOTE DEVICE CHECK
Battery Remaining Longevity: 82 mo
Date Time Interrogation Session: 20181001125823
Implantable Lead Implant Date: 20141208
Implantable Lead Model: 4076
Lead Channel Impedance Value: 419 Ohm
Lead Channel Pacing Threshold Amplitude: 1.25 V
Lead Channel Pacing Threshold Pulse Width: 0.4 ms
Lead Channel Setting Pacing Amplitude: 2.5 V
MDC IDC LEAD LOCATION: 753860
MDC IDC MSMT BATTERY IMPEDANCE: 405 Ohm
MDC IDC MSMT BATTERY VOLTAGE: 2.77 V
MDC IDC MSMT LEADCHNL RA IMPEDANCE VALUE: 0 Ohm
MDC IDC PG IMPLANT DT: 20161027
MDC IDC SET LEADCHNL RV PACING PULSEWIDTH: 0.4 ms
MDC IDC SET LEADCHNL RV SENSING SENSITIVITY: 2 mV
MDC IDC STAT BRADY RV PERCENT PACED: 99 %

## 2017-01-28 NOTE — Progress Notes (Signed)
Remote pacemaker transmission.   

## 2017-01-29 ENCOUNTER — Encounter: Payer: Self-pay | Admitting: Cardiology

## 2017-01-31 ENCOUNTER — Encounter (HOSPITAL_COMMUNITY): Payer: Self-pay

## 2017-01-31 ENCOUNTER — Emergency Department (HOSPITAL_COMMUNITY): Payer: Medicare Other

## 2017-01-31 ENCOUNTER — Emergency Department (HOSPITAL_COMMUNITY)
Admission: EM | Admit: 2017-01-31 | Discharge: 2017-01-31 | Disposition: A | Discharge status: Home / Self Care | Payer: Medicare Other | Attending: Physician Assistant | Admitting: Physician Assistant

## 2017-01-31 DIAGNOSIS — D649 Anemia, unspecified: Secondary | ICD-10-CM | POA: Diagnosis not present

## 2017-01-31 DIAGNOSIS — L97519 Non-pressure chronic ulcer of other part of right foot with unspecified severity: Secondary | ICD-10-CM | POA: Insufficient documentation

## 2017-01-31 DIAGNOSIS — Z885 Allergy status to narcotic agent status: Secondary | ICD-10-CM | POA: Insufficient documentation

## 2017-01-31 DIAGNOSIS — Z79899 Other long term (current) drug therapy: Secondary | ICD-10-CM | POA: Insufficient documentation

## 2017-01-31 DIAGNOSIS — Y748 Miscellaneous general hospital and personal-use devices associated with adverse incidents, not elsewhere classified: Secondary | ICD-10-CM | POA: Insufficient documentation

## 2017-01-31 DIAGNOSIS — Z7982 Long term (current) use of aspirin: Secondary | ICD-10-CM | POA: Diagnosis not present

## 2017-01-31 DIAGNOSIS — E039 Hypothyroidism, unspecified: Secondary | ICD-10-CM | POA: Diagnosis not present

## 2017-01-31 DIAGNOSIS — T8140XA Infection following a procedure, unspecified, initial encounter: Secondary | ICD-10-CM | POA: Diagnosis present

## 2017-01-31 DIAGNOSIS — E11622 Type 2 diabetes mellitus with other skin ulcer: Secondary | ICD-10-CM | POA: Diagnosis not present

## 2017-01-31 DIAGNOSIS — E11621 Type 2 diabetes mellitus with foot ulcer: Secondary | ICD-10-CM | POA: Diagnosis not present

## 2017-01-31 LAB — COMPREHENSIVE METABOLIC PANEL
ALK PHOS: 90 U/L (ref 38–126)
ALT: 13 U/L — AB (ref 14–54)
ANION GAP: 9 (ref 5–15)
AST: 17 U/L (ref 15–41)
Albumin: 3.9 g/dL (ref 3.5–5.0)
BILIRUBIN TOTAL: 0.7 mg/dL (ref 0.3–1.2)
BUN: 20 mg/dL (ref 6–20)
CALCIUM: 9.2 mg/dL (ref 8.9–10.3)
CO2: 23 mmol/L (ref 22–32)
CREATININE: 1.21 mg/dL — AB (ref 0.44–1.00)
Chloride: 108 mmol/L (ref 101–111)
GFR calc non Af Amer: 39 mL/min — ABNORMAL LOW (ref >60)
GFR, EST AFRICAN AMERICAN: 46 mL/min — AB (ref >60)
Glucose, Bld: 142 mg/dL — ABNORMAL HIGH (ref 65–99)
Potassium: 3.4 mmol/L — ABNORMAL LOW (ref 3.5–5.1)
SODIUM: 140 mmol/L (ref 135–145)
TOTAL PROTEIN: 6.9 g/dL (ref 6.5–8.1)

## 2017-01-31 LAB — CBC WITH DIFFERENTIAL/PLATELET
BASOS ABS: 0 10*3/uL (ref 0.0–0.1)
BASOS PCT: 0 %
EOS ABS: 0.4 10*3/uL (ref 0.0–0.7)
Eosinophils Relative: 5 %
HCT: 45.8 % (ref 36.0–46.0)
HEMOGLOBIN: 15 g/dL (ref 12.0–15.0)
Lymphocytes Relative: 20 %
Lymphs Abs: 1.5 10*3/uL (ref 0.7–4.0)
MCH: 30.7 pg (ref 26.0–34.0)
MCHC: 32.8 g/dL (ref 30.0–36.0)
MCV: 93.7 fL (ref 78.0–100.0)
MONO ABS: 0.6 10*3/uL (ref 0.1–1.0)
MONOS PCT: 8 %
NEUTROS PCT: 67 %
Neutro Abs: 5.1 10*3/uL (ref 1.7–7.7)
Platelets: 232 10*3/uL (ref 150–400)
RBC: 4.89 MIL/uL (ref 3.87–5.11)
RDW: 13.5 % (ref 11.5–15.5)
WBC: 7.5 10*3/uL (ref 4.0–10.5)

## 2017-01-31 LAB — I-STAT CG4 LACTIC ACID, ED: Lactic Acid, Venous: 1.48 mmol/L (ref 0.5–1.9)

## 2017-01-31 LAB — URINALYSIS, ROUTINE W REFLEX MICROSCOPIC
BILIRUBIN URINE: NEGATIVE
Bacteria, UA: NONE SEEN
GLUCOSE, UA: NEGATIVE mg/dL
HGB URINE DIPSTICK: NEGATIVE
Ketones, ur: NEGATIVE mg/dL
NITRITE: NEGATIVE
Protein, ur: NEGATIVE mg/dL
SPECIFIC GRAVITY, URINE: 1.004 — AB (ref 1.005–1.030)
Squamous Epithelial / LPF: NONE SEEN
pH: 5 (ref 5.0–8.0)

## 2017-01-31 MED ORDER — IOPAMIDOL (ISOVUE-300) INJECTION 61%
INTRAVENOUS | Status: AC
  Administered 2017-01-31: 75 mL
  Filled 2017-01-31: qty 75

## 2017-01-31 NOTE — ED Triage Notes (Signed)
Pt. Has had a hx of non-healing wounds.  She has a wound that is red and eschar that is on the bottom opf the rt. Foot in the middle of th foot.  She reports being treated at 2 different wound centers but it is not healing.  She reports the only time she has improvement is with IV antibiotic therapy.  Skin is p/w/d  She denies any pain to the area. She denies any n/v/d/ , fevers or chills.

## 2017-01-31 NOTE — Discharge Instructions (Signed)
Please follow up with podiatry for further care on this right foot. You will need to follow-up as soon as possible.

## 2017-01-31 NOTE — ED Provider Notes (Addendum)
[facility] DEPT Provider Note   CSN: [id number] Arrival date & time: 01/31/17  1257     History   Chief Complaint Chief Complaint  Patient presents with  . Wound Infection    HPI [name] is a 83 y.o. female.  HPI   Patient is an 83 year old female presenting with infection to the right foot. Patient went to see her regular primary care provider, Dr. Maudie Mercury. Unfortunately he was out with gout so he had to see a physician was on for him. Physician looked at her foot, and sent her here to the emergency department. Patient reports that she had an ulcer there a long time ago but does not remember any recent treatment for any foot related issues. Patient says she was unaware that the foot was black.  She has no systemic symptoms.  Past Medical History:  Diagnosis Date  . Cataract   . Complete heart block (HCC)    s/p PPM implant (MDT) by Dr Blanch Media.  Atrial lead could not be paced at time of the procedure.  She has chronic AV dysociation  . Delusional disorder ([place])   . Exogenous obesity   . Hypercholesterolemia   . Invasive ductal carcinoma of breast ([place]) 03/2007   BILATERAL BREASTS  . Orthostatic hypotension    treated with midodrine by Dr Rollene [name]  . PONV (postoperative nausea and vomiting)   . Thyroid disease   . Venous insufficiency     Patient Active Problem List   Diagnosis Date Noted  . Sigmoid diverticulitis 10/14/2016  . Diverticulitis 10/14/2016  . Varicose veins of bilateral lower extremities with other complications 78/29/5621  . Venous insufficiency 08/25/2015  . Cellulitis 08/09/2015  . Pressure ulcer 08/09/2015  . Delusional disorder ([place]) 07/05/2015  . Bereavement reaction 07/05/2015  . Pacemaker at end of battery life 02/23/2015  . CHB (complete heart block) ([place])   . Acute GI bleeding 02/15/2015  . Acute kidney injury (nontraumatic) ([place]) 02/15/2015  . Hypothyroidism 02/15/2015  . Left upper quadrant pain   . Osteopenia 12/22/2013  .  Postural dizziness 04/05/2013  . Complete heart block ([place]) 02/26/2013  . S/P placement of cardiac pacemaker 02/26/2013  . Bilateral breast cancer ([place]) 03/25/2011    Past Surgical History:  Procedure Laterality Date  . ABDOMINAL HYSTERECTOMY    . BREAST LUMPECTOMY Bilateral 2009  . CATARACT EXTRACTION     EYE SURGERY X 2  . CHOLECYSTECTOMY    . COLONOSCOPY N/A 02/16/2015   Procedure: COLONOSCOPY;  Surgeon: Wilford Corner, MD;  Location: [facility] ENDOSCOPY;  Service: Endoscopy;  Laterality: N/A;  . EP IMPLANTABLE DEVICE N/A 02/23/2015   pacemaker generator change (MDT Sensia SR) by Dr Rayann Heman   . ESOPHAGOGASTRODUODENOSCOPY N/A 02/16/2015   Procedure: ESOPHAGOGASTRODUODENOSCOPY (EGD);  Surgeon: Wilford Corner, MD;  Location: [facility] ENDOSCOPY;  Service: Endoscopy;  Laterality: N/A;  . PACEMAKER INSERTION     MDT implanted by Dr Blanch Media.  Atrial lead placement was unsuccessful.  She has chronic AV dysociation  . remote right radical nephrectomy  2009    OB History    No data available       Home Medications    Prior to Admission medications   Medication Sig Start Date End Date Taking? Authorizing Provider  aspirin EC 81 MG tablet Take 81 mg by mouth daily before breakfast.    [provider]  Calcium Carbonate-Vitamin D (CALTRATE 600+D PO) Take 600 mg by mouth daily after breakfast.    [provider]  furosemide (LASIX)  20 MG tablet Take 20 mg by mouth daily with breakfast.     [provider]  letrozole (FEMARA) 2.5 MG tablet Take 1 tablet (2.5 mg total) by mouth daily. Patient taking differently: Take 2.5 mg by mouth daily with breakfast.  09/19/16   [name], MD  levothyroxine (SYNTHROID, LEVOTHROID) 50 MCG tablet Take 50 mcg by mouth daily before breakfast.     [provider]  meclizine (ANTIVERT) 12.5 MG tablet Take 1 tablet (12.5 mg total) by mouth 3 (three) times daily as needed for dizziness. 07/29/16   Long, Wonda Olds, MD  midodrine  (PROAMATINE) 2.5 MG tablet Take 2.5 mg by mouth daily after breakfast.     [provider]  omeprazole (PRILOSEC) 20 MG capsule Take 1 capsule (20 mg total) by mouth daily. 10/17/16   Johnson, Clanford L, MD  ondansetron (ZOFRAN ODT) 4 MG disintegrating tablet 4mg  ODT q4 hours prn nausea/vomit Patient taking differently: Take 4 mg by mouth every 4 (four) hours as needed for nausea or vomiting.  10/13/16   Milton Ferguson, MD  OVER THE COUNTER MEDICATION Place 1 drop into both eyes daily as needed (dry eyes). Over the counter lubricating eye drop     [provider]  Probiotic Product (PROBIOTIC PO) Take 1 tablet by mouth daily with breakfast.    [provider]    Family History Family History  Problem Relation Age of Onset  . Heart disease Maternal Grandmother   . Heart disease Mother     Social History Social History  Substance Use Topics  . Smoking status: Never Smoker  . Smokeless tobacco: Never Used  . Alcohol use No     Allergies   Erythromycin; Tape; Codeine; Flagyl [metronidazole]; Macrobid [nitrofurantoin macrocrystal]; Tolterodine; and Ciprofloxacin   Review of Systems Review of Systems  Constitutional: Negative for fatigue.  HENT: Negative for congestion.   Respiratory: Negative for chest tightness.   Cardiovascular: Negative for chest pain.  Gastrointestinal: Negative for abdominal distention.  Genitourinary: Negative for dysuria.  Musculoskeletal: Negative for joint swelling.  Skin: Negative for rash.  Allergic/Immunologic: Negative for immunocompromised state.  Psychiatric/Behavioral: Negative for behavioral problems.  All other systems reviewed and are negative.    Physical Exam Updated Vital Signs BP 121/62   Pulse 64   Temp (!) 97.3 F (36.3 C) (Oral)   Resp 16   Ht 5' 7.5" (1.715 m)   Wt 88.9 kg (196 lb)   SpO2 98%   BMI 30.24 kg/m   Physical Exam  Constitutional: She is oriented to person, place, and time. She  appears well-developed and well-nourished.  HENT:  Head: Normocephalic and atraumatic.  Eyes: Right eye exhibits no discharge. Left eye exhibits no discharge.  Cardiovascular: Normal rate.   Pulmonary/Chest: Effort normal.  scar  Abdominal: Soft. Bowel sounds are normal. She exhibits no distension. There is no tenderness.  Neurological: She is oriented to person, place, and time.  Skin: Skin is warm and dry. She is not diaphoretic.  R foot with necrotic eschar tracking from plantar lesion.  Psychiatric: She has a normal mood and affect.  Nursing note and vitals reviewed.    ED Treatments / Results  Labs (all labs ordered are listed, but only abnormal results are displayed) Labs Reviewed  COMPREHENSIVE METABOLIC PANEL - Abnormal; Notable for the following:       Result Value   Potassium 3.4 (*)    Glucose, Bld 142 (*)    Creatinine, Ser 1.21 (*)  ALT 13 (*)    GFR calc non Af Amer 39 (*)    GFR calc Af Amer 46 (*)    All other components within normal limits  URINALYSIS, ROUTINE W REFLEX MICROSCOPIC - Abnormal; Notable for the following:    Color, Urine STRAW (*)    Specific Gravity, Urine 1.004 (*)    Leukocytes, UA TRACE (*)    All other components within normal limits  CBC WITH DIFFERENTIAL/PLATELET  I-STAT CG4 LACTIC ACID, ED  I-STAT CG4 LACTIC ACID, ED    EKG  EKG Interpretation None       Radiology No results found.  Procedures Procedures (including critical care time)  Medications Ordered in ED Medications - No data to display   Initial Impression / Assessment and Plan / ED Course  I have reviewed the triage vital signs and the nursing notes.  Pertinent labs & imaging results that were available during my care of the patient were reviewed by me and considered in my medical decision making (see chart for details).    Patient is an 83 year old female presenting with infection to the right foot. Patient went to see her regular primary care provider,  Dr. Maudie Mercury. Unfortunately he was out with gout so he had to see a physician was on for him. Physician looked at her foot (she did not go there for her foot, has no foot complaints) and sent her here to the emergency department. Patient reports that she had an ulcer there a long time ago but does not remember any recent treatment for any foot related issues. Patient says she was unaware that the foot was black.  She has no systemic symptoms.  7:28 PM Patient's CT shows no evidence of osteomyelitis. It does not appear to have  active cellulitis there. It appears more chronic in nature. Patient unsure the last time that she was able to look at the ball of her foot. I believe whatever this as it is chronic in nature. We'll have her follow-up with podiatry on Monday. I don't believe that she needs IV antibiotics. There is no surrounding warmth, infection. Her vital signs are normal she has no signs of infection with white blood cell count otherwise.  Final Clinical Impressions(s) / ED Diagnoses   Final diagnoses:  None    New Prescriptions New Prescriptions   No medications on file     Macarthur Critchley, MD 01/31/17 1931    Macarthur Critchley, MD 01/31/17 1944

## 2017-01-31 NOTE — ED Notes (Signed)
Patient transported to CT 

## 2017-02-05 ENCOUNTER — Encounter: Payer: Self-pay | Admitting: Podiatry

## 2017-02-05 ENCOUNTER — Ambulatory Visit (INDEPENDENT_AMBULATORY_CARE_PROVIDER_SITE_OTHER): Payer: Medicare Other | Admitting: Podiatry

## 2017-02-05 VITALS — BP 111/64 | HR 74

## 2017-02-05 DIAGNOSIS — M79676 Pain in unspecified toe(s): Secondary | ICD-10-CM | POA: Diagnosis not present

## 2017-02-05 DIAGNOSIS — L989 Disorder of the skin and subcutaneous tissue, unspecified: Secondary | ICD-10-CM | POA: Diagnosis not present

## 2017-02-05 DIAGNOSIS — B351 Tinea unguium: Secondary | ICD-10-CM

## 2017-02-11 NOTE — Progress Notes (Signed)
    Subjective: Patient is a 83 y.o. female presenting to the office today as a new patient with a chief complaint of a painful callus lesion to the right foot that has been present for several months. Patient also complains of elongated, thickened nails that cause pain while ambulating in shoes. Patient is unable to trim their own nails. Patient presents today for further treatment and evaluation.  Past Medical History:  Diagnosis Date  . Cataract   . Complete heart block (HCC)    s/p PPM implant (MDT) by Dr Blanch Media.  Atrial lead could not be paced at time of the procedure.  She has chronic AV dysociation  . Delusional disorder ([place])   . Exogenous obesity   . Hypercholesterolemia   . Invasive ductal carcinoma of breast ([place]) 03/2007   BILATERAL BREASTS  . Orthostatic hypotension    treated with midodrine by Dr Rollene [name]  . PONV (postoperative nausea and vomiting)   . Thyroid disease   . Venous insufficiency     Objective:  Physical Exam General: Alert and oriented x3 in no acute distress  Dermatology: Hyperkeratotic lesion present on the Right foot. Pain on palpation with a central nucleated core noted. Skin is warm, dry and supple bilateral lower extremities. Negative for open lesions or macerations. Nails are tender, long, thickened and dystrophic with subungual debris, consistent with onychomycosis, 1-5 bilateral. No signs of infection noted.  Vascular: Palpable pedal pulses bilaterally. No edema or erythema noted. Capillary refill within normal limits.  Neurological: Epicritic and protective threshold grossly intact bilaterally.   Musculoskeletal Exam: Pain on palpation at the keratotic lesion noted. Range of motion within normal limits bilateral. Muscle strength 5/5 in all groups bilateral.  Assessment: 1. Onychodystrophic nails 1-5 bilateral with hyperkeratosis of nails.  2. Onychomycosis of nail due to dermatophyte bilateral 3. Pre-ulcerative callous to the right  foot   Plan of Care:  #1 Patient evaluated. #2 Excisional debridement of keratoic lesion using a chisel blade was performed without incident.  #3 Dressed with light dressing. #4 Mechanical debridement of nails 1-5 bilaterally performed using a nail nipper. Filed with dremel without incident.  #5 Patient is to return to the clinic in 4 weeks.   Edrick Kins, DPM [facility]  Dr. Edrick Kins, [address]                                        [place], [place] [address]                Office [phone]  Fax [phone]

## 2017-02-28 DIAGNOSIS — E039 Hypothyroidism, unspecified: Secondary | ICD-10-CM | POA: Diagnosis not present

## 2017-02-28 DIAGNOSIS — E11621 Type 2 diabetes mellitus with foot ulcer: Secondary | ICD-10-CM | POA: Diagnosis not present

## 2017-02-28 DIAGNOSIS — L97511 Non-pressure chronic ulcer of other part of right foot limited to breakdown of skin: Secondary | ICD-10-CM | POA: Diagnosis not present

## 2017-02-28 DIAGNOSIS — Z0000 Encounter for general adult medical examination without abnormal findings: Secondary | ICD-10-CM | POA: Diagnosis not present

## 2017-03-05 ENCOUNTER — Ambulatory Visit (INDEPENDENT_AMBULATORY_CARE_PROVIDER_SITE_OTHER): Payer: Medicare Other | Admitting: Podiatry

## 2017-03-05 ENCOUNTER — Encounter: Payer: Self-pay | Admitting: Podiatry

## 2017-03-05 DIAGNOSIS — L989 Disorder of the skin and subcutaneous tissue, unspecified: Secondary | ICD-10-CM

## 2017-03-09 NOTE — Progress Notes (Signed)
   Subjective: Patient with diabetes mellitus presents to the office today for follow up evaluation of a pre-ulcerative callus lesion of the right foot. She states she believes the area is improving and denies any new complaints at this time. Patient presents today for further treatment and evaluation.  Objective:  Physical Exam General: Alert and oriented x3 in no acute distress  Dermatology: Hyperkeratotic lesion present on the right foot. Pain on palpation with a central nucleated core noted.  Skin is warm, dry and supple bilateral lower extremities. Negative for open lesions or macerations.  Vascular: Palpable pedal pulses bilaterally. No edema or erythema noted. Capillary refill within normal limits.  Neurological: Epicritic and protective threshold diminished bilaterally.   Musculoskeletal Exam: Pain on palpation at the keratotic lesion noted. Range of motion within normal limits bilateral. Muscle strength 5/5 in all groups bilateral.  Assessment: #1 Diabetes mellitus w/ peripheral neuropathy #2 Pre-ulcerative callus right foot   Plan of Care:  #1 Patient evaluated #2 Excisional debridement of keratotic lesion using a chisel blade was performed without incident.  #3 Dressed area with light dressing. #4 Patient is to return to the clinic in 3 months for routine nail care.    Edrick Kins, DPM [facility]  Dr. Edrick Kins, [address]                                        [place], [place] [address]                Office [phone]  Fax [phone]

## 2017-03-12 DIAGNOSIS — E11622 Type 2 diabetes mellitus with other skin ulcer: Secondary | ICD-10-CM | POA: Diagnosis not present

## 2017-03-12 DIAGNOSIS — E039 Hypothyroidism, unspecified: Secondary | ICD-10-CM | POA: Diagnosis not present

## 2017-03-12 DIAGNOSIS — D649 Anemia, unspecified: Secondary | ICD-10-CM | POA: Diagnosis not present

## 2017-03-12 DIAGNOSIS — E11621 Type 2 diabetes mellitus with foot ulcer: Secondary | ICD-10-CM | POA: Diagnosis not present

## 2017-03-26 ENCOUNTER — Encounter (HOSPITAL_COMMUNITY): Payer: Self-pay | Admitting: Emergency Medicine

## 2017-03-26 DIAGNOSIS — Z885 Allergy status to narcotic agent status: Secondary | ICD-10-CM

## 2017-03-26 DIAGNOSIS — R197 Diarrhea, unspecified: Secondary | ICD-10-CM | POA: Diagnosis not present

## 2017-03-26 DIAGNOSIS — E7800 Pure hypercholesterolemia, unspecified: Secondary | ICD-10-CM | POA: Diagnosis not present

## 2017-03-26 DIAGNOSIS — R7989 Other specified abnormal findings of blood chemistry: Secondary | ICD-10-CM | POA: Diagnosis present

## 2017-03-26 DIAGNOSIS — I872 Venous insufficiency (chronic) (peripheral): Secondary | ICD-10-CM | POA: Diagnosis present

## 2017-03-26 DIAGNOSIS — K921 Melena: Secondary | ICD-10-CM | POA: Diagnosis not present

## 2017-03-26 DIAGNOSIS — K644 Residual hemorrhoidal skin tags: Secondary | ICD-10-CM | POA: Diagnosis present

## 2017-03-26 DIAGNOSIS — Z950 Presence of cardiac pacemaker: Secondary | ICD-10-CM

## 2017-03-26 DIAGNOSIS — K2970 Gastritis, unspecified, without bleeding: Secondary | ICD-10-CM | POA: Diagnosis not present

## 2017-03-26 DIAGNOSIS — K5731 Diverticulosis of large intestine without perforation or abscess with bleeding: Secondary | ICD-10-CM | POA: Diagnosis not present

## 2017-03-26 DIAGNOSIS — Z888 Allergy status to other drugs, medicaments and biological substances status: Secondary | ICD-10-CM

## 2017-03-26 DIAGNOSIS — I959 Hypotension, unspecified: Secondary | ICD-10-CM | POA: Diagnosis present

## 2017-03-26 DIAGNOSIS — M8580 Other specified disorders of bone density and structure, unspecified site: Secondary | ICD-10-CM | POA: Diagnosis present

## 2017-03-26 DIAGNOSIS — Z7982 Long term (current) use of aspirin: Secondary | ICD-10-CM

## 2017-03-26 DIAGNOSIS — K5730 Diverticulosis of large intestine without perforation or abscess without bleeding: Secondary | ICD-10-CM | POA: Diagnosis not present

## 2017-03-26 DIAGNOSIS — Z881 Allergy status to other antibiotic agents status: Secondary | ICD-10-CM

## 2017-03-26 DIAGNOSIS — K7460 Unspecified cirrhosis of liver: Secondary | ICD-10-CM | POA: Diagnosis present

## 2017-03-26 DIAGNOSIS — Z91048 Other nonmedicinal substance allergy status: Secondary | ICD-10-CM

## 2017-03-26 DIAGNOSIS — Z853 Personal history of malignant neoplasm of breast: Secondary | ICD-10-CM

## 2017-03-26 DIAGNOSIS — K8689 Other specified diseases of pancreas: Secondary | ICD-10-CM | POA: Diagnosis present

## 2017-03-26 DIAGNOSIS — Z79811 Long term (current) use of aromatase inhibitors: Secondary | ICD-10-CM

## 2017-03-26 DIAGNOSIS — E039 Hypothyroidism, unspecified: Secondary | ICD-10-CM | POA: Diagnosis present

## 2017-03-26 DIAGNOSIS — Z905 Acquired absence of kidney: Secondary | ICD-10-CM

## 2017-03-26 LAB — COMPREHENSIVE METABOLIC PANEL
ALT: 13 U/L — ABNORMAL LOW (ref 14–54)
ANION GAP: 11 (ref 5–15)
AST: 18 U/L (ref 15–41)
Albumin: 3.5 g/dL (ref 3.5–5.0)
Alkaline Phosphatase: 100 U/L (ref 38–126)
BILIRUBIN TOTAL: 0.5 mg/dL (ref 0.3–1.2)
BUN: 22 mg/dL — AB (ref 6–20)
CO2: 23 mmol/L (ref 22–32)
Calcium: 8.7 mg/dL — ABNORMAL LOW (ref 8.9–10.3)
Chloride: 106 mmol/L (ref 101–111)
Creatinine, Ser: 1.08 mg/dL — ABNORMAL HIGH (ref 0.44–1.00)
GFR calc Af Amer: 52 mL/min — ABNORMAL LOW (ref >60)
GFR calc non Af Amer: 45 mL/min — ABNORMAL LOW (ref >60)
GLUCOSE: 93 mg/dL (ref 65–99)
POTASSIUM: 3.8 mmol/L (ref 3.5–5.1)
Sodium: 140 mmol/L (ref 135–145)
Total Protein: 6.3 g/dL — ABNORMAL LOW (ref 6.5–8.1)

## 2017-03-26 LAB — CBC
HCT: 39.2 % (ref 36.0–46.0)
HEMOGLOBIN: 12.8 g/dL (ref 12.0–15.0)
MCH: 31.1 pg (ref 26.0–34.0)
MCHC: 32.7 g/dL (ref 30.0–36.0)
MCV: 95.1 fL (ref 78.0–100.0)
Platelets: 233 10*3/uL (ref 150–400)
RBC: 4.12 MIL/uL (ref 3.87–5.11)
RDW: 13 % (ref 11.5–15.5)
WBC: 7.5 10*3/uL (ref 4.0–10.5)

## 2017-03-26 LAB — LIPASE, BLOOD: Lipase: 28 U/L (ref 11–51)

## 2017-03-26 NOTE — ED Triage Notes (Signed)
Pt here from home with c/o upper abd pain and rectal bleeding

## 2017-03-27 ENCOUNTER — Inpatient Hospital Stay (HOSPITAL_COMMUNITY)
Admission: EM | Admit: 2017-03-27 | Discharge: 2017-03-29 | DRG: 379 | Disposition: A | Discharge status: Home / Self Care | Payer: Medicare Other | Attending: Family Medicine | Admitting: Family Medicine

## 2017-03-27 ENCOUNTER — Emergency Department (HOSPITAL_COMMUNITY): Payer: Medicare Other

## 2017-03-27 DIAGNOSIS — K921 Melena: Secondary | ICD-10-CM | POA: Diagnosis not present

## 2017-03-27 DIAGNOSIS — Z8719 Personal history of other diseases of the digestive system: Secondary | ICD-10-CM | POA: Diagnosis not present

## 2017-03-27 DIAGNOSIS — K922 Gastrointestinal hemorrhage, unspecified: Secondary | ICD-10-CM | POA: Diagnosis not present

## 2017-03-27 DIAGNOSIS — K5730 Diverticulosis of large intestine without perforation or abscess without bleeding: Secondary | ICD-10-CM | POA: Diagnosis not present

## 2017-03-27 DIAGNOSIS — K625 Hemorrhage of anus and rectum: Secondary | ICD-10-CM | POA: Diagnosis present

## 2017-03-27 LAB — CBC
HCT: 39.6 % (ref 36.0–46.0)
HEMATOCRIT: 38.8 % (ref 36.0–46.0)
HEMATOCRIT: 36.8 % (ref 36.0–46.0)
HEMOGLOBIN: 12.6 g/dL (ref 12.0–15.0)
HEMOGLOBIN: 12.9 g/dL (ref 12.0–15.0)
HEMOGLOBIN: 12 g/dL (ref 12.0–15.0)
MCH: 31.4 pg (ref 26.0–34.0)
MCH: 31.2 pg (ref 26.0–34.0)
MCH: 31 pg (ref 26.0–34.0)
MCHC: 32.5 g/dL (ref 30.0–36.0)
MCHC: 32.6 g/dL (ref 30.0–36.0)
MCHC: 32.6 g/dL (ref 30.0–36.0)
MCV: 96.8 fL (ref 78.0–100.0)
MCV: 95.9 fL (ref 78.0–100.0)
MCV: 95.1 fL (ref 78.0–100.0)
Platelets: 221 10*3/uL (ref 150–400)
Platelets: 255 10*3/uL (ref 150–400)
Platelets: 253 10*3/uL (ref 150–400)
RBC: 4.01 MIL/uL (ref 3.87–5.11)
RBC: 4.13 MIL/uL (ref 3.87–5.11)
RBC: 3.87 MIL/uL (ref 3.87–5.11)
RDW: 13.2 % (ref 11.5–15.5)
RDW: 13.2 % (ref 11.5–15.5)
RDW: 13.3 % (ref 11.5–15.5)
WBC: 7.4 10*3/uL (ref 4.0–10.5)
WBC: 7.1 10*3/uL (ref 4.0–10.5)
WBC: 8.1 10*3/uL (ref 4.0–10.5)

## 2017-03-27 LAB — URINALYSIS, ROUTINE W REFLEX MICROSCOPIC
BACTERIA UA: NONE SEEN
Bilirubin Urine: NEGATIVE
Glucose, UA: NEGATIVE mg/dL
KETONES UR: NEGATIVE mg/dL
Leukocytes, UA: NEGATIVE
Nitrite: NEGATIVE
PROTEIN: NEGATIVE mg/dL
SQUAMOUS EPITHELIAL / LPF: NONE SEEN
pH: 6 (ref 5.0–8.0)

## 2017-03-27 LAB — POC OCCULT BLOOD, ED: Fecal Occult Bld: POSITIVE — AB

## 2017-03-27 LAB — TYPE AND SCREEN
ABO/RH(D): O POS
ANTIBODY SCREEN: NEGATIVE

## 2017-03-27 LAB — PROTIME-INR
INR: 0.98
Prothrombin Time: 12.9 seconds (ref 11.4–15.2)

## 2017-03-27 MED ORDER — ONDANSETRON HCL 4 MG PO TABS: 4.0000 mg | ORAL_TABLET | Freq: Four times a day (QID) | ORAL | Status: DC | PRN

## 2017-03-27 MED ORDER — LETROZOLE 2.5 MG PO TABS
2.5000 mg | ORAL_TABLET | Freq: Every day | ORAL | Status: DC
  Administered 2017-03-27 – 2017-03-29 (×3): 2.5 mg via ORAL
  Filled 2017-03-27 (×3): qty 1

## 2017-03-27 MED ORDER — PANCRELIPASE (LIP-PROT-AMYL) 12000-38000 UNITS PO CPEP
36000.0000 [IU] | ORAL_CAPSULE | Freq: Three times a day (TID) | ORAL | Status: DC
  Administered 2017-03-27 – 2017-03-29 (×8): 36000 [IU] via ORAL
  Filled 2017-03-27: qty 3
  Filled 2017-03-27: qty 1
  Filled 2017-03-27: qty 3
  Filled 2017-03-27 (×2): qty 1
  Filled 2017-03-27 (×3): qty 3
  Filled 2017-03-27: qty 1

## 2017-03-27 MED ORDER — ACETAMINOPHEN 325 MG PO TABS: 650.0000 mg | ORAL_TABLET | Freq: Four times a day (QID) | ORAL | Status: DC | PRN

## 2017-03-27 MED ORDER — IOPAMIDOL (ISOVUE-300) INJECTION 61%
INTRAVENOUS | Status: AC
  Administered 2017-03-27: 100 mL
  Filled 2017-03-27: qty 100

## 2017-03-27 MED ORDER — LEVOTHYROXINE SODIUM 50 MCG PO TABS
50.0000 ug | ORAL_TABLET | Freq: Every day | ORAL | Status: DC
  Administered 2017-03-27 – 2017-03-29 (×3): 50 ug via ORAL
  Filled 2017-03-27 (×4): qty 1

## 2017-03-27 MED ORDER — ACETAMINOPHEN 650 MG RE SUPP: 650.0000 mg | Freq: Four times a day (QID) | RECTAL | Status: DC | PRN

## 2017-03-27 MED ORDER — ONDANSETRON HCL 4 MG/2ML IJ SOLN: 4.0000 mg | Freq: Four times a day (QID) | INTRAMUSCULAR | Status: DC | PRN

## 2017-03-27 MED ORDER — MIDODRINE HCL 5 MG PO TABS
2.5000 mg | ORAL_TABLET | Freq: Every day | ORAL | Status: DC
  Administered 2017-03-27 – 2017-03-29 (×3): 2.5 mg via ORAL
  Filled 2017-03-27 (×3): qty 1

## 2017-03-27 NOTE — H&P (Signed)
History and Physical    [name] SWF:[id number] DOB: 10/01/1927 DOA: 03/27/2017  PCP: [name], MD  Patient coming from: Home  I have personally briefly reviewed patient's old medical records in [place]  Chief Complaint: BRBPR  HPI: [name] is a 83 y.o. female with medical history significant of diverticulitis in June this year, previous BRBPR in Oct 2016 from suspected diverticular bleed with nl EGD, polyp (s/p snare) and diverticulosis on colonoscopy.  Patient presents to the ED with BRBPR.  Has diarrhea at baseline, but since Nov 23rd has had BRB in stool.  Initially improved but then worsened again yesterday.  On ASA but no other blood thinners.  No vomiting, does have epigastric and LLQ abd pain, constant.   ED Course: 3 episodes of BRBPR since arrival to ED at 6pm.   Review of Systems: As per HPI otherwise 10 point review of systems negative.   Past Medical History:  Diagnosis Date  . Cataract   . Complete heart block (HCC)    s/p PPM implant (MDT) by Dr Blanch Media.  Atrial lead could not be paced at time of the procedure.  She has chronic AV dysociation  . Delusional disorder ([place])   . Exogenous obesity   . Hypercholesterolemia   . Invasive ductal carcinoma of breast ([place]) 03/2007   BILATERAL BREASTS  . Orthostatic hypotension    treated with midodrine by Dr Rollene [name]  . PONV (postoperative nausea and vomiting)   . Thyroid disease   . Venous insufficiency     Past Surgical History:  Procedure Laterality Date  . ABDOMINAL HYSTERECTOMY    . BREAST LUMPECTOMY Bilateral 2009  . CATARACT EXTRACTION     EYE SURGERY X 2  . CHOLECYSTECTOMY    . COLONOSCOPY N/A 02/16/2015   Procedure: COLONOSCOPY;  Surgeon: Wilford Corner, MD;  Location: [facility] ENDOSCOPY;  Service: Endoscopy;  Laterality: N/A;  . EP IMPLANTABLE DEVICE N/A 02/23/2015   pacemaker generator change (MDT Sensia SR) by Dr Rayann Heman   . ESOPHAGOGASTRODUODENOSCOPY N/A 02/16/2015   Procedure: ESOPHAGOGASTRODUODENOSCOPY (EGD);  Surgeon: Wilford Corner, MD;  Location[facility] ENDOSCOPY;  Service: Endoscopy;  Laterality: N/A;  . PACEMAKER INSERTION     MDT implanted by Dr Blanch Media.  Atrial lead placement was unsuccessful.  She has chronic AV dysociation  . remote right radical nephrectomy  2009     reports that  has never smoked. she has never used smokeless tobacco. She reports that she does not drink alcohol or use drugs.  Allergies  Allergen Reactions  . Erythromycin Hives  . Tape Other (See Comments)    BAND AIDS-SKIN IRRITATION  . Codeine Nausea And Vomiting  . Flagyl [Metronidazole] Hives  . Macrobid [Nitrofurantoin Macrocrystal] Nausea And Vomiting  . Tolterodine Nausea And Vomiting  . Ciprofloxacin Hives and Rash    Family History  Problem Relation Age of Onset  . Heart disease Maternal Grandmother   . Heart disease Mother      Prior to Admission medications   Medication Sig Start Date End Date Taking? Authorizing Provider  aspirin EC 81 MG tablet Take 81 mg by mouth daily before breakfast.   Yes [provider]  Calcium Carbonate-Vitamin D (CALTRATE 600+D PO) Take 600 mg by mouth daily after breakfast.   Yes [provider]  furosemide (LASIX) 20 MG tablet Take 20 mg by mouth daily with breakfast.    Yes [provider]  letrozole (FEMARA) 2.5 MG tablet Take 1 tablet (2.5 mg total)  by mouth daily. Patient taking differently: Take 2.5 mg by mouth daily with breakfast.  09/19/16  Yes [name], MD  levothyroxine (SYNTHROID, LEVOTHROID) 50 MCG tablet Take 50 mcg by mouth daily before breakfast.    Yes [provider]  lipase/protease/amylase (CREON) 36000 UNITS CPEP capsule Take 36,000 Units by mouth 3 (three) times daily before meals.   Yes [provider]  midodrine (PROAMATINE) 2.5 MG tablet Take 2.5 mg by mouth daily after breakfast.    Yes [provider]  Probiotic Product (PROBIOTIC PO) Take 1  tablet by mouth daily with breakfast.   Yes [provider]    Physical Exam: Vitals:   03/27/17 0113 03/27/17 0242 03/27/17 0300 03/27/17 0401  BP: 122/72 106/63 102/65 94/68  Pulse: 67 65  (!) 59  Resp: 18  13 15   Temp:      TempSrc:      SpO2: 100% 100%  97%    Constitutional: NAD, calm, comfortable Eyes: PERRL, lids and conjunctivae normal ENMT: Mucous membranes are moist. Posterior pharynx clear of any exudate or lesions.Normal dentition.  Neck: normal, supple, no masses, no thyromegaly Respiratory: clear to auscultation bilaterally, no wheezing, no crackles. Normal respiratory effort. No accessory muscle use.  Cardiovascular: Regular rate and rhythm, no murmurs / rubs / gallops. No extremity edema. 2+ pedal pulses. No carotid bruits.  Abdomen: no tenderness, no masses palpated. No hepatosplenomegaly. Bowel sounds positive.  Musculoskeletal: no clubbing / cyanosis. No joint deformity upper and lower extremities. Good ROM, no contractures. Normal muscle tone.  Skin: no rashes, lesions, ulcers. No induration Neurologic: CN 2-12 grossly intact. Sensation intact, DTR normal. Strength 5/5 in all 4.  Psychiatric: Normal judgment and insight. Alert and oriented x 3. Normal mood.    Labs on Admission: I have personally reviewed following labs and imaging studies  CBC: Recent Labs  Lab 03/26/17 1821  WBC 7.5  HGB 12.8  HCT 39.2  MCV 95.1  PLT 941   Basic Metabolic Panel: Recent Labs  Lab 03/26/17 1821  NA 140  K 3.8  CL 106  CO2 23  GLUCOSE 93  BUN 22*  CREATININE 1.08*  CALCIUM 8.7*   GFR: CrCl cannot be calculated (Unknown ideal weight.). Liver Function Tests: Recent Labs  Lab 03/26/17 1821  AST 18  ALT 13*  ALKPHOS 100  BILITOT 0.5  PROT 6.3*  ALBUMIN 3.5   Recent Labs  Lab 03/26/17 1821  LIPASE 28   No results for input(s): AMMONIA in the last 168 hours. Coagulation Profile: Recent Labs  Lab 03/27/17 0213  INR 0.98   Cardiac  Enzymes: No results for input(s): CKTOTAL, CKMB, CKMBINDEX, TROPONINI in the last 168 hours. BNP (last 3 results) No results for input(s): PROBNP in the last 8760 hours. HbA1C: No results for input(s): HGBA1C in the last 72 hours. CBG: No results for input(s): GLUCAP in the last 168 hours. Lipid Profile: No results for input(s): CHOL, HDL, LDLCALC, TRIG, CHOLHDL, LDLDIRECT in the last 72 hours. Thyroid Function Tests: No results for input(s): TSH, T4TOTAL, FREET4, T3FREE, THYROIDAB in the last 72 hours. Anemia Panel: No results for input(s): VITAMINB12, FOLATE, FERRITIN, TIBC, IRON, RETICCTPCT in the last 72 hours. Urine analysis:    Component Value Date/Time   COLORURINE STRAW (A) 01/31/2017 1324   APPEARANCEUR CLEAR 01/31/2017 1324   LABSPEC 1.004 (L) 01/31/2017 1324   PHURINE 5.0 01/31/2017 1324   GLUCOSEU NEGATIVE 01/31/2017 1324   HGBUR NEGATIVE 01/31/2017 1324   BILIRUBINUR NEGATIVE 01/31/2017  [place] 01/31/2017 [place] 01/31/2017 1324   UROBILINOGEN 0.2 04/12/2014 1928   NITRITE NEGATIVE 01/31/2017 1324   LEUKOCYTESUR TRACE (A) 01/31/2017 1324    Radiological Exams on Admission: Ct Abdomen Pelvis W Contrast  Result Date: 03/27/2017 CLINICAL DATA:  Upper abdominal pain. Rectal bleeding. History of breast cancer, RIGHT nephrectomy, cholecystectomy and hysterectomy. EXAM: CT ABDOMEN AND PELVIS WITH CONTRAST TECHNIQUE: Multidetector CT imaging of the abdomen and pelvis was performed using the standard protocol following bolus administration of intravenous contrast. CONTRAST:  150mL ISOVUE-300 IOPAMIDOL (ISOVUE-300) INJECTION 61% COMPARISON:  CT abdomen and pelvis October 13, 2016 FINDINGS: LOWER CHEST: Bibasilar similar atelectasis/ scarring. Included heart size is borderline enlarged. Pacemaker wires. No pericardial effusion. HEPATOBILIARY: Similar borderline hepatomegaly with prominent LEFT lobe slightly nodular contour. Focal fatty  infiltration about the falciform ligament. Status post cholecystectomy. PANCREAS: Normal. SPLEEN: Normal. ADRENALS/URINARY TRACT: Status post LEFT nephrectomy with calcified fat necrosis in surgical bed. Mild compensatory RIGHT nephromegaly with homogeneously hypodense benign-appearing renal cysts measuring to 4 cm. Additional too small to characterize hypodensities. Upper pole scarring. No nephrolithiasis or hydronephrosis. No solid renal mass. Delayed phase demonstrates prompt excretion of contrast into the proximal urinary collecting system. Urinary bladder is partially distended unremarkable. STOMACH/BOWEL: The stomach, small and large bowel are normal in course and caliber without inflammatory changes, sensitivity decreased without oral contrast. Severe colonic diverticulosis. Duodenum diverticulum. Small calcified bone fragment in small bowel, appearance of gallstone though, unlikely given cholecystectomy. VASCULAR/LYMPHATIC: Aortoiliac vessels are normal in caliber, tortuous course. Mild calcific atherosclerosis. No lymphadenopathy by CT size criteria. REPRODUCTIVE: Status post hysterectomy. OTHER: No intraperitoneal free fluid or free air. MUSCULOSKELETAL: Nonacute. Anterior abdominal wall laxity. Moderate L4-5 degenerative disc. RIGHT breast scarring and skin thickening, incompletely imaged. IMPRESSION: 1. Severe colonic diverticulosis without acute diverticulitis or acute intra-abdominal/pelvic process. 2. Borderline hepatomegaly and early suspected cirrhosis. Recommend correlation with liver function test. 3. RIGHT breast skin thickening and scarring, likely treatment related though, recommend direct inspection . Electronically Signed   By: Elon Alas M.D.   On: 03/27/2017 04:15    EKG: Independently reviewed.  Assessment/Plan Principal Problem:   BRBPR (bright red blood per rectum) Active Problems:   Acute GI bleeding    1. BRBPR - 1. Probably diverticular bleed 2. CT today shows  diverticulosis but no diverticulitis despite abd tenderness, no SIRS 3. Clear liquid diet 4. Type and screen 5. Repeat CBC in AM, transfuse if needed 6. Call GI in AM  DVT prophylaxis: SCDs Code Status: Full Family Communication: No family in room Disposition Plan: Home after admit Consults called: None Admission status: Place in obs - suspect she may meet criteria for conversion to inpatient however   Etta Quill DO [facility] Pager [phone]  If 7AM-7PM, please contact day team taking care of patient [email or web address] Password [email or web address]  03/27/2017, 4:25 AM

## 2017-03-27 NOTE — ED Notes (Signed)
Admitting at bedside 

## 2017-03-27 NOTE — ED Notes (Signed)
Pt up eating dinner tray

## 2017-03-27 NOTE — ED Notes (Signed)
Pt ambulated to room with stand by assist and steady gait

## 2017-03-27 NOTE — ED Notes (Signed)
Pt ambulated to room with steady gait. NAD.

## 2017-03-27 NOTE — ED Notes (Addendum)
Clear Liquid - Thin Fluids - diet breakfast tray ordered @ 0615.

## 2017-03-27 NOTE — ED Notes (Signed)
Pt ambulatory to bathroom. Had episode of bloody diarrhea with several large blood clots.

## 2017-03-27 NOTE — ED Notes (Signed)
Messaged pharmacy for missing medications.

## 2017-03-27 NOTE — Progress Notes (Addendum)
Am admission, possible diverticular bleed vs hemorroids bleed. patient is stable, no fever, no n/v, denies pain, no acute bleed, hgb stable I have discussed case with GI over the phone, will follow gi recommendation.

## 2017-03-27 NOTE — ED Provider Notes (Signed)
[facility] EMERGENCY DEPARTMENT Provider Note   CSN: [id number] Arrival date & time: 03/26/17  1752     History   Chief Complaint Chief Complaint  Patient presents with  . Abdominal Pain  . Rectal Bleeding    HPI [name] is a 83 y.o. female.  Patient presents with rectal bleeding and abdominal pain.  States symptoms started on November 23.  She has diarrhea at baseline but since November 23 she has had bright red blood in her stool and bright red blood with wiping.  Portable has turned red.  Symptoms initially improved but then became worse again today.  She has had 3 episodes of bloody stools since she arrived at 6 PM.  Denies any vomiting, dizziness, lightheadedness, chest pain or shortness of breath.  She takes aspirin but no other blood thinners.  She complains of left lower quadrant pain as well as epigastric pain that is constant.  Still has a good appetite.  No history of ulcers or acid reflux disease.  Does have a history of diverticulosis.   The history is provided by the patient.  Abdominal Pain   Associated symptoms include diarrhea and hematochezia. Pertinent negatives include fever, nausea, vomiting, dysuria, hematuria, headaches, arthralgias and myalgias.  Rectal Bleeding  Associated symptoms: abdominal pain   Associated symptoms: no dizziness, no fever and no vomiting     Past Medical History:  Diagnosis Date  . Cataract   . Complete heart block (HCC)    s/p PPM implant (MDT) by Dr Blanch Media.  Atrial lead could not be paced at time of the procedure.  She has chronic AV dysociation  . Delusional disorder ([place])   . Exogenous obesity   . Hypercholesterolemia   . Invasive ductal carcinoma of breast ([place]) 03/2007   BILATERAL BREASTS  . Orthostatic hypotension    treated with midodrine by Dr Rollene [name]  . PONV (postoperative nausea and vomiting)   . Thyroid disease   . Venous insufficiency     Patient Active Problem List   Diagnosis  Date Noted  . Sigmoid diverticulitis 10/14/2016  . Diverticulitis 10/14/2016  . Varicose veins of bilateral lower extremities with other complications 84/13/2440  . Venous insufficiency 08/25/2015  . Cellulitis 08/09/2015  . Pressure ulcer 08/09/2015  . Delusional disorder ([place]) 07/05/2015  . Bereavement reaction 07/05/2015  . Pacemaker at end of battery life 02/23/2015  . CHB (complete heart block) ([place])   . Acute GI bleeding 02/15/2015  . Acute kidney injury (nontraumatic) ([place]) 02/15/2015  . Hypothyroidism 02/15/2015  . Left upper quadrant pain   . Osteopenia 12/22/2013  . Postural dizziness 04/05/2013  . Complete heart block ([place]) 02/26/2013  . S/P placement of cardiac pacemaker 02/26/2013  . Bilateral breast cancer ([place]) 03/25/2011    Past Surgical History:  Procedure Laterality Date  . ABDOMINAL HYSTERECTOMY    . BREAST LUMPECTOMY Bilateral 2009  . CATARACT EXTRACTION     EYE SURGERY X 2  . CHOLECYSTECTOMY    . COLONOSCOPY N/A 02/16/2015   Procedure: COLONOSCOPY;  Surgeon: Wilford Corner, MD;  Location: [facility] ENDOSCOPY;  Service: Endoscopy;  Laterality: N/A;  . EP IMPLANTABLE DEVICE N/A 02/23/2015   pacemaker generator change (MDT Sensia SR) by Dr Rayann Heman   . ESOPHAGOGASTRODUODENOSCOPY N/A 02/16/2015   Procedure: ESOPHAGOGASTRODUODENOSCOPY (EGD);  Surgeon: Wilford Corner, MD;  Location: [facility] ENDOSCOPY;  Service: Endoscopy;  Laterality: N/A;  . PACEMAKER INSERTION     MDT implanted by Dr Blanch Media.  Atrial lead placement was unsuccessful.  She has chronic AV dysociation  . remote right radical nephrectomy  2009    OB History    No data available       Home Medications    Prior to Admission medications   Medication Sig Start Date End Date Taking? Authorizing Provider  aspirin EC 81 MG tablet Take 81 mg by mouth daily before breakfast.    [provider]  Calcium Carbonate-Vitamin D (CALTRATE 600+D PO) Take 600 mg by mouth daily after breakfast.    [provider]  furosemide (LASIX) 20 MG tablet Take 20 mg by mouth daily with breakfast.     [provider]  letrozole (FEMARA) 2.5 MG tablet Take 1 tablet (2.5 mg total) by mouth daily. Patient taking differently: Take 2.5 mg by mouth daily with breakfast.  09/19/16   [name], MD  levothyroxine (SYNTHROID, LEVOTHROID) 50 MCG tablet Take 50 mcg by mouth daily before breakfast.     [provider]  meclizine (ANTIVERT) 12.5 MG tablet Take 1 tablet (12.5 mg total) by mouth 3 (three) times daily as needed for dizziness. 07/29/16   Long, Wonda Olds, MD  midodrine (PROAMATINE) 2.5 MG tablet Take 2.5 mg by mouth daily after breakfast.     [provider]  omeprazole (PRILOSEC) 20 MG capsule Take 1 capsule (20 mg total) by mouth daily. 10/17/16   Johnson, Clanford L, MD  ondansetron (ZOFRAN ODT) 4 MG disintegrating tablet 4mg  ODT q4 hours prn nausea/vomit Patient taking differently: Take 4 mg by mouth every 4 (four) hours as needed for nausea or vomiting.  10/13/16   Milton Ferguson, MD  OVER THE COUNTER MEDICATION Place 1 drop into both eyes daily as needed (dry eyes). Over the counter lubricating eye drop     [provider]  Probiotic Product (PROBIOTIC PO) Take 1 tablet by mouth daily with breakfast.    [provider]    Family History Family History  Problem Relation Age of Onset  . Heart disease Maternal Grandmother   . Heart disease Mother     Social History Social History   Tobacco Use  . Smoking status: Never Smoker  . Smokeless tobacco: Never Used  Substance Use Topics  . Alcohol use: No    Alcohol/week: 0.0 oz  . Drug use: No     Allergies   Erythromycin; Tape; Codeine; Flagyl [metronidazole]; Macrobid [nitrofurantoin macrocrystal]; Tolterodine; and Ciprofloxacin   Review of Systems Review of Systems  Constitutional: Positive for activity change and appetite change. Negative for fatigue and fever.  HENT: Negative for  congestion and rhinorrhea.   Respiratory: Negative for cough, chest tightness and shortness of breath.   Gastrointestinal: Positive for abdominal pain, blood in stool, diarrhea and hematochezia. Negative for nausea and vomiting.  Genitourinary: Negative for dysuria, hematuria, vaginal bleeding and vaginal discharge.  Musculoskeletal: Negative for arthralgias, back pain and myalgias.  Skin: Negative for rash.  Neurological: Negative for dizziness, weakness and headaches.    all other systems are negative except as noted in the HPI and PMH.    Physical Exam Updated Vital Signs BP 122/72   Pulse 67   Temp 97.8 F (36.6 C) (Oral)   Resp 18   SpO2 100%   Physical Exam  Constitutional: She is oriented to person, place, and time. She appears well-developed and well-nourished. No distress.  Nontoxic. No pallor  HENT:  Head: Normocephalic and atraumatic.  Mouth/Throat: Oropharynx is clear and moist. No oropharyngeal exudate.  Eyes: Conjunctivae and EOM  are normal. Pupils are equal, round, and reactive to light.  Neck: Normal range of motion. Neck supple.  No meningismus.  Cardiovascular: Normal rate, regular rhythm, normal heart sounds and intact distal pulses.  No murmur heard. Pulmonary/Chest: Effort normal and breath sounds normal. No respiratory distress.  Abdominal: Soft. There is tenderness. There is no rebound and no guarding.  TTP epigastrium, LLQ. No guarding or rebound  Genitourinary:  Genitourinary Comments: Chaperone present, small external hemorrhoid.  Gross blood on rectal exam.  No fissures  Musculoskeletal: Normal range of motion. She exhibits no edema or tenderness.  No CVA tenderness  Neurological: She is alert and oriented to person, place, and time. No cranial nerve deficit. She exhibits normal muscle tone. Coordination normal.  No ataxia on finger to nose bilaterally. No pronator drift. 5/5 strength throughout. CN 2-12 intact.Equal grip strength. Sensation intact.    Skin: Skin is warm. Capillary refill takes less than 2 seconds. No pallor.  Psychiatric: She has a normal mood and affect. Her behavior is normal.  Nursing note and vitals reviewed.    ED Treatments / Results  Labs (all labs ordered are listed, but only abnormal results are displayed) Labs Reviewed  COMPREHENSIVE METABOLIC PANEL - Abnormal; Notable for the following components:      Result Value   BUN 22 (*)    Creatinine, Ser 1.08 (*)    Calcium 8.7 (*)    Total Protein 6.3 (*)    ALT 13 (*)    GFR calc non Af Amer 45 (*)    GFR calc Af Amer 52 (*)    All other components within normal limits  POC OCCULT BLOOD, ED - Abnormal; Notable for the following components:   Fecal Occult Bld POSITIVE (*)    All other components within normal limits  LIPASE, BLOOD  CBC  PROTIME-INR  URINALYSIS, ROUTINE W REFLEX MICROSCOPIC  CBC  TYPE AND SCREEN    EKG  EKG Interpretation None       Radiology Ct Abdomen Pelvis W Contrast  Result Date: 03/27/2017 CLINICAL DATA:  Upper abdominal pain. Rectal bleeding. History of breast cancer, RIGHT nephrectomy, cholecystectomy and hysterectomy. EXAM: CT ABDOMEN AND PELVIS WITH CONTRAST TECHNIQUE: Multidetector CT imaging of the abdomen and pelvis was performed using the standard protocol following bolus administration of intravenous contrast. CONTRAST:  121mL ISOVUE-300 IOPAMIDOL (ISOVUE-300) INJECTION 61% COMPARISON:  CT abdomen and pelvis October 13, 2016 FINDINGS: LOWER CHEST: Bibasilar similar atelectasis/ scarring. Included heart size is borderline enlarged. Pacemaker wires. No pericardial effusion. HEPATOBILIARY: Similar borderline hepatomegaly with prominent LEFT lobe slightly nodular contour. Focal fatty infiltration about the falciform ligament. Status post cholecystectomy. PANCREAS: Normal. SPLEEN: Normal. ADRENALS/URINARY TRACT: Status post LEFT nephrectomy with calcified fat necrosis in surgical bed. Mild compensatory RIGHT nephromegaly with  homogeneously hypodense benign-appearing renal cysts measuring to 4 cm. Additional too small to characterize hypodensities. Upper pole scarring. No nephrolithiasis or hydronephrosis. No solid renal mass. Delayed phase demonstrates prompt excretion of contrast into the proximal urinary collecting system. Urinary bladder is partially distended unremarkable. STOMACH/BOWEL: The stomach, small and large bowel are normal in course and caliber without inflammatory changes, sensitivity decreased without oral contrast. Severe colonic diverticulosis. Duodenum diverticulum. Small calcified bone fragment in small bowel, appearance of gallstone though, unlikely given cholecystectomy. VASCULAR/LYMPHATIC: Aortoiliac vessels are normal in caliber, tortuous course. Mild calcific atherosclerosis. No lymphadenopathy by CT size criteria. REPRODUCTIVE: Status post hysterectomy. OTHER: No intraperitoneal free fluid or free air. MUSCULOSKELETAL: Nonacute. Anterior abdominal wall laxity. Moderate L4-5  degenerative disc. RIGHT breast scarring and skin thickening, incompletely imaged. IMPRESSION: 1. Severe colonic diverticulosis without acute diverticulitis or acute intra-abdominal/pelvic process. 2. Borderline hepatomegaly and early suspected cirrhosis. Recommend correlation with liver function test. 3. RIGHT breast skin thickening and scarring, likely treatment related though, recommend direct inspection . Electronically Signed   By: Elon Alas M.D.   On: 03/27/2017 04:15    Procedures Procedures (including critical care time)  Medications Ordered in ED Medications - No data to display   Initial Impression / Assessment and Plan / ED Course  I have reviewed the triage vital signs and the nursing notes.  Pertinent labs & imaging results that were available during my care of the patient were reviewed by me and considered in my medical decision making (see chart for details).    Patient with 6 days of rectal bleeding,  upper abdominal pain left lower quadrant pain.  Vitals are stable.  No distress.  Gross blood on rectal exam.  EGD from 2016 was normal.  Colonoscopy did show diverticulosis.  Vitals stable.  Patient does have gross blood on rectal exam.  Hemoglobin has dropped 2 g from previous. Suspect diverticular bleed.  CT scan shows diverticulosis without evidence of diverticulitis.  Vitals remained stable.  Patient continues to have bloody diarrhea in the ED.  Observation admission discussed with Dr. Alcario Drought.  Final Clinical Impressions(s) / ED Diagnoses   Final diagnoses:  Hematochezia  History of GI diverticular bleed    ED Discharge Orders    None       Taia Bramlett, Annie Main, MD 03/27/17 0430

## 2017-03-27 NOTE — Consult Note (Signed)
[name] Consult  Referring Provider: Etta Quill, [name] Primary Care Physician:  Nichole Gravel, MD Primary Gastroenterologist: [name]  Reason for Consultation: Rectal bleeding  HPI: [name] is a 83 y.o. female presented to the ED with complains of rectal bleeding. Patient was in her usual state of health until a week ago, when she noticed bright red blood, moderate in amount after a bowel movement. This continued for a few days, several times a day, at times she also saw a small amount of clots and this prompted her to proceed to the ER. Normally, patient has 3 bowel movements a day ,usually after each meals, described as loose but not watery.  She denies abdominal pain, rectal pain. Denies SOB, chest pain, dizziness, nausea, vomiting. She denies acid reflux, regurgitation, difficulty or pain on swallowing. She apparently had 3 episodes of BRBPR since arrival to ER. As per ER provider's documentation, rectal exam revealed gross blood, small external hemorrhoids and no fissures.  Past Medical History:  Diagnosis Date  . Cataract   . Complete heart block (HCC)    s/p PPM implant (MDT) by [name].  Atrial lead could not be paced at time of the procedure.  She has chronic AV dysociation  . Delusional disorder ([place])   . Exogenous obesity   . Hypercholesterolemia   . Invasive ductal carcinoma of breast ([facility]) 03/2007   BILATERAL BREASTS  . Orthostatic hypotension    treated with midodrine by [name]  . PONV (postoperative nausea and vomiting)   . Thyroid disease   . Venous insufficiency     Past Surgical History:  Procedure Laterality Date  . ABDOMINAL HYSTERECTOMY    . BREAST LUMPECTOMY Bilateral 2009  . CATARACT EXTRACTION     EYE SURGERY X 2  . CHOLECYSTECTOMY    . COLONOSCOPY N/A 02/16/2015   Procedure: COLONOSCOPY;  Surgeon: Nichole Corner, MD;  Location: [facility] ENDOSCOPY;  Service: Endoscopy;  Laterality: N/A;  . EP IMPLANTABLE DEVICE N/A  02/23/2015   pacemaker generator change (MDT Sensia SR) by Nichole Rayann Heman   . ESOPHAGOGASTRODUODENOSCOPY N/A 02/16/2015   Procedure: ESOPHAGOGASTRODUODENOSCOPY (EGD);  Surgeon: Nichole Corner, MD;  Location: [facility] ENDOSCOPY;  Service: Endoscopy;  Laterality: N/A;  . PACEMAKER INSERTION     MDT implanted by [name].  Atrial lead placement was unsuccessful.  She has chronic AV dysociation  . remote right radical nephrectomy  2009    Prior to Admission medications   Medication Sig Start Date End Date Taking? Authorizing Provider  aspirin EC 81 MG tablet Take 81 mg by mouth daily before breakfast.   Yes [provider]  Calcium Carbonate-Vitamin D (CALTRATE 600+D PO) Take 600 mg by mouth daily after breakfast.   Yes [provider]  furosemide (LASIX) 20 MG tablet Take 20 mg by mouth daily with breakfast.    Yes [provider]  letrozole (FEMARA) 2.5 MG tablet Take 1 tablet (2.5 mg total) by mouth daily. Patient taking differently: Take 2.5 mg by mouth daily with breakfast.  09/19/16  Yes Nichole Lose, MD  levothyroxine (SYNTHROID, LEVOTHROID) 50 MCG tablet Take 50 mcg by mouth daily before breakfast.    Yes [provider]  lipase/protease/amylase (CREON) 36000 UNITS CPEP capsule Take 36,000 Units by mouth 3 (three) times daily before meals.   Yes [provider]  midodrine (PROAMATINE) 2.5 MG tablet Take 2.5 mg by mouth daily after breakfast.    Yes [provider]  Probiotic Product (PROBIOTIC PO) Take 1  tablet by mouth daily with breakfast.   Yes [provider]    Current Facility-Administered Medications  Medication Dose Route Frequency Provider Last Rate Last Dose  . acetaminophen (TYLENOL) tablet 650 mg  650 mg Oral Q6H PRN Nichole Quill, [name]       Or  . acetaminophen (TYLENOL) suppository 650 mg  650 mg Rectal Q6H PRN Nichole Quill, [name]      . letrozole [facility]) tablet 2.5 mg  2.5 mg Oral Q breakfast [name], [name]       . levothyroxine (SYNTHROID, LEVOTHROID) tablet 50 mcg  50 mcg Oral QAC breakfast Nichole Quill, [name]      . lipase/protease/amylase (CREON) capsule 36,000 Units  36,000 Units Oral TID [name] Nichole Quill, [name]   36,000 Units at 03/27/17 6144  . midodrine (PROAMATINE) tablet 2.5 mg  2.5 mg Oral QPC breakfast [name] M, [name]      . ondansetron [facility]) tablet 4 mg  4 mg Oral Q6H PRN Nichole Quill, [name]       Or  . ondansetron [facility]) injection 4 mg  4 mg Intravenous Q6H PRN Nichole Quill, [name]       Current Outpatient Medications  Medication Sig Dispense Refill  . aspirin EC 81 MG tablet Take 81 mg by mouth daily before breakfast.    . Calcium Carbonate-Vitamin D (CALTRATE 600+D PO) Take 600 mg by mouth daily after breakfast.    . furosemide (LASIX) 20 MG tablet Take 20 mg by mouth daily with breakfast.     . letrozole (FEMARA) 2.5 MG tablet Take 1 tablet (2.5 mg total) by mouth daily. (Patient taking differently: Take 2.5 mg by mouth daily with breakfast. ) 90 tablet 3  . levothyroxine (SYNTHROID, LEVOTHROID) 50 MCG tablet Take 50 mcg by mouth daily before breakfast.     . lipase/protease/amylase (CREON) 36000 UNITS CPEP capsule Take 36,000 Units by mouth 3 (three) times daily before meals.    . midodrine (PROAMATINE) 2.5 MG tablet Take 2.5 mg by mouth daily after breakfast.     . Probiotic Product (PROBIOTIC PO) Take 1 tablet by mouth daily with breakfast.      Allergies as of 03/26/2017 - Review Complete 03/26/2017  Allergen Reaction Noted  . Erythromycin Hives 02/01/2011  . Tape Other (See Comments) 02/01/2011  . Codeine Nausea And Vomiting 11/07/2015  . Flagyl [metronidazole] Hives 10/14/2016  . Macrobid [nitrofurantoin macrocrystal] Nausea And Vomiting 05/05/2016  . Tolterodine Nausea And Vomiting 11/07/2015  . Ciprofloxacin Hives and Rash 10/14/2016    Family History  Problem Relation Age of Onset  . Heart disease Maternal Grandmother   . Heart disease Mother      Social History   Socioeconomic History  . Marital status: Widowed    Spouse name: Not on file  . Number of children: Not on file  . Years of education: Not on file  . Highest education level: Not on file  Social Needs  . Financial resource strain: Not on file  . Food insecurity - worry: Not on file  . Food insecurity - inability: Not on file  . Transportation needs - medical: Not on file  . Transportation needs - non-medical: Not on file  Occupational History  . Not on file  Tobacco Use  . Smoking status: Never Smoker  . Smokeless tobacco: Never Used  Substance and Sexual Activity  . Alcohol use: No    Alcohol/week: 0.0 oz  . Drug use: No  .  Sexual activity: Not Currently  Other Topics Concern  . Not on file  Social History Narrative  . Not on file    Review of Systems: Positive for GI: Described in detail in HPI.    Gen: Denies any fever, chills, rigors, night sweats, anorexia, fatigue, weakness, malaise, involuntary weight loss, and sleep disorder CV: Denies chest pain, angina, palpitations, syncope, orthopnea, PND, peripheral edema, and claudication. Resp: Denies dyspnea, cough, sputum, wheezing, coughing up blood. GU : Denies urinary burning, blood in urine, urinary frequency, urinary hesitancy, nocturnal urination, and urinary incontinence. MS: Denies joint pain or swelling.  Denies muscle weakness, cramps, atrophy.  Derm: Denies rash, itching, oral ulcerations, hives, unhealing ulcers.  Psych: Denies depression, anxiety, memory loss, suicidal ideation, hallucinations,  and confusion. Heme: bleeding ,Denies bruising, , and enlarged lymph nodes. Neuro:  Denies any headaches, dizziness, paresthesias. Endo:  Denies any problems with DM, thyroid, adrenal function.  Physical Exam: Vital signs in last 24 hours: Temp:  [97.7 F (36.5 C)-97.8 F (36.6 C)] 97.8 F (36.6 C) (11/28 2134) Pulse Rate:  [59-67] 63 (11/29 1200) Resp:  [13-24] 15 (11/29 1200) BP:  (94-122)/(53-96) 115/68 (11/29 1200) SpO2:  [95 %-100 %] 96 % (11/29 1200)    General:   Alert,  Well-developed, well-nourished, pleasant and cooperative in NAD Head:  Normocephalic and atraumatic. Eyes:  Sclera clear, no icterus.   Conjunctiva pink. Ears:  Normal auditory acuity. Nose:  No deformity, discharge,  or lesions. Mouth:  No deformity or lesions.  Oropharynx pink & moist. Neck:  Supple; no masses or thyromegaly. Lungs:  Clear throughout to auscultation.   No wheezes, crackles, or rhonchi. No acute distress. Heart:  Regular rate and rhythm; no murmurs, clicks, rubs,  or gallops. Extremities:  Without clubbing or edema. Neurologic:  Alert and  oriented x4;  grossly normal neurologically. Skin:  Intact without significant lesions or rashes. Psych:  Alert and cooperative. Normal mood and affect. Abdomen:  Soft, nontender and nondistended. No masses, hepatosplenomegaly or hernias noted. Normal bowel sounds, without guarding, and without rebound.         Lab Results: Recent Labs    03/26/17 1821 03/27/17 0500 03/27/17 0800  WBC 7.5 7.4 7.1  HGB 12.8 12.6 12.9  HCT 39.2 38.8 39.6  PLT 233 221 255   BMET Recent Labs    03/26/17 1821  NA 140  K 3.8  CL 106  CO2 23  GLUCOSE 93  BUN 22*  CREATININE 1.08*  CALCIUM 8.7*   LFT Recent Labs    03/26/17 1821  PROT 6.3*  ALBUMIN 3.5  AST 18  ALT 13*  ALKPHOS 100  BILITOT 0.5   PT/INR Recent Labs    03/27/17 0213  LABPROT 12.9  INR 0.98    Studies/Results: Ct Abdomen Pelvis W Contrast  Result Date: 03/27/2017 CLINICAL DATA:  Upper abdominal pain. Rectal bleeding. History of breast cancer, RIGHT nephrectomy, cholecystectomy and hysterectomy. EXAM: CT ABDOMEN AND PELVIS WITH CONTRAST TECHNIQUE: Multidetector CT imaging of the abdomen and pelvis was performed using the standard protocol following bolus administration of intravenous contrast. CONTRAST:  146mL ISOVUE-300 IOPAMIDOL (ISOVUE-300) INJECTION 61%  COMPARISON:  CT abdomen and pelvis October 13, 2016 FINDINGS: LOWER CHEST: Bibasilar similar atelectasis/ scarring. Included heart size is borderline enlarged. Pacemaker wires. No pericardial effusion. HEPATOBILIARY: Similar borderline hepatomegaly with prominent LEFT lobe slightly nodular contour. Focal fatty infiltration about the falciform ligament. Status post cholecystectomy. PANCREAS: Normal. SPLEEN: Normal. ADRENALS/URINARY TRACT: Status post LEFT nephrectomy with calcified fat  necrosis in surgical bed. Mild compensatory RIGHT nephromegaly with homogeneously hypodense benign-appearing renal cysts measuring to 4 cm. Additional too small to characterize hypodensities. Upper pole scarring. No nephrolithiasis or hydronephrosis. No solid renal mass. Delayed phase demonstrates prompt excretion of contrast into the proximal urinary collecting system. Urinary bladder is partially distended unremarkable. STOMACH/BOWEL: The stomach, small and large bowel are normal in course and caliber without inflammatory changes, sensitivity decreased without oral contrast. Severe colonic diverticulosis. Duodenum diverticulum. Small calcified bone fragment in small bowel, appearance of gallstone though, unlikely given cholecystectomy. VASCULAR/LYMPHATIC: Aortoiliac vessels are normal in caliber, tortuous course. Mild calcific atherosclerosis. No lymphadenopathy by CT size criteria. REPRODUCTIVE: Status post hysterectomy. OTHER: No intraperitoneal free fluid or free air. MUSCULOSKELETAL: Nonacute. Anterior abdominal wall laxity. Moderate L4-5 degenerative disc. RIGHT breast scarring and skin thickening, incompletely imaged. IMPRESSION: 1. Severe colonic diverticulosis without acute diverticulitis or acute intra-abdominal/pelvic process. 2. Borderline hepatomegaly and early suspected cirrhosis. Recommend correlation with liver function test. 3. RIGHT breast skin thickening and scarring, likely treatment related though, recommend direct  inspection . Electronically Signed   By: Elon Alas M.D.   On: 03/27/2017 04:15    Impression: 1.  Painless hematochezia. 2. Severe colonic diverticulosis on CAT scan. 3. Hemoglobin stable at 12.8/12.6/12.9, hemodynamically stable, hemoglobin 15 on 01/31/17 4. Mildly elevated BUN/creatinine ratio(22/1.08) 5. History of diarrhea, on Creon for suspected ?pancreatic insufficiency.  Pancreas appeared normal on CAT scan with contrast.   Plan: Diverticular bleed versus hemorrhoidal bleed. Colonoscopy performed on 02/16/15 for similar presentation showed left-sided diverticulosis. Patient already started on regular diet. If hemoglobin remains stable by tomorrow and rectal bleeding resolves okay to discharge in a.m. If rectal bleeding continues/patient becomes hemodynamically unstable /requires blood transfusion then may need further inpatient workup. Monitor H&H and transfuse if needed.  Ronnette Juniper, M.D.     LOS: 0 days     03/27/2017, 12:25 PM  Pager [phone] If no answer or after 5 PM call [phone]

## 2017-03-27 NOTE — ED Notes (Signed)
Pt eating lunch. Pt repositioned & sitting up on the side of the bed.

## 2017-03-27 NOTE — ED Notes (Signed)
Pt ambulatory to sink to brush teeth and to the restroom, new gown given to pt and underwear. Pt had a large amount of dark red blood dried in her underwear.

## 2017-03-28 ENCOUNTER — Other Ambulatory Visit: Payer: Self-pay

## 2017-03-28 DIAGNOSIS — K7460 Unspecified cirrhosis of liver: Secondary | ICD-10-CM | POA: Diagnosis present

## 2017-03-28 DIAGNOSIS — K921 Melena: Secondary | ICD-10-CM | POA: Diagnosis not present

## 2017-03-28 DIAGNOSIS — Z853 Personal history of malignant neoplasm of breast: Secondary | ICD-10-CM | POA: Diagnosis not present

## 2017-03-28 DIAGNOSIS — E7800 Pure hypercholesterolemia, unspecified: Secondary | ICD-10-CM | POA: Diagnosis present

## 2017-03-28 DIAGNOSIS — K8689 Other specified diseases of pancreas: Secondary | ICD-10-CM | POA: Diagnosis present

## 2017-03-28 DIAGNOSIS — Z885 Allergy status to narcotic agent status: Secondary | ICD-10-CM | POA: Diagnosis not present

## 2017-03-28 DIAGNOSIS — Z8719 Personal history of other diseases of the digestive system: Secondary | ICD-10-CM | POA: Diagnosis not present

## 2017-03-28 DIAGNOSIS — K5710 Diverticulosis of small intestine without perforation or abscess without bleeding: Secondary | ICD-10-CM | POA: Diagnosis not present

## 2017-03-28 DIAGNOSIS — K644 Residual hemorrhoidal skin tags: Secondary | ICD-10-CM | POA: Diagnosis present

## 2017-03-28 DIAGNOSIS — R7989 Other specified abnormal findings of blood chemistry: Secondary | ICD-10-CM | POA: Diagnosis present

## 2017-03-28 DIAGNOSIS — K5731 Diverticulosis of large intestine without perforation or abscess with bleeding: Secondary | ICD-10-CM | POA: Diagnosis present

## 2017-03-28 DIAGNOSIS — Z79811 Long term (current) use of aromatase inhibitors: Secondary | ICD-10-CM | POA: Diagnosis not present

## 2017-03-28 DIAGNOSIS — I872 Venous insufficiency (chronic) (peripheral): Secondary | ICD-10-CM | POA: Diagnosis present

## 2017-03-28 DIAGNOSIS — Z888 Allergy status to other drugs, medicaments and biological substances status: Secondary | ICD-10-CM | POA: Diagnosis not present

## 2017-03-28 DIAGNOSIS — K625 Hemorrhage of anus and rectum: Secondary | ICD-10-CM | POA: Diagnosis not present

## 2017-03-28 DIAGNOSIS — I959 Hypotension, unspecified: Secondary | ICD-10-CM | POA: Diagnosis present

## 2017-03-28 DIAGNOSIS — D649 Anemia, unspecified: Secondary | ICD-10-CM | POA: Diagnosis not present

## 2017-03-28 DIAGNOSIS — E039 Hypothyroidism, unspecified: Secondary | ICD-10-CM | POA: Diagnosis present

## 2017-03-28 DIAGNOSIS — K922 Gastrointestinal hemorrhage, unspecified: Secondary | ICD-10-CM | POA: Diagnosis present

## 2017-03-28 DIAGNOSIS — Z91048 Other nonmedicinal substance allergy status: Secondary | ICD-10-CM | POA: Diagnosis not present

## 2017-03-28 DIAGNOSIS — Z881 Allergy status to other antibiotic agents status: Secondary | ICD-10-CM | POA: Diagnosis not present

## 2017-03-28 DIAGNOSIS — M8580 Other specified disorders of bone density and structure, unspecified site: Secondary | ICD-10-CM | POA: Diagnosis present

## 2017-03-28 DIAGNOSIS — Z7982 Long term (current) use of aspirin: Secondary | ICD-10-CM | POA: Diagnosis not present

## 2017-03-28 DIAGNOSIS — Z905 Acquired absence of kidney: Secondary | ICD-10-CM | POA: Diagnosis not present

## 2017-03-28 DIAGNOSIS — Z950 Presence of cardiac pacemaker: Secondary | ICD-10-CM | POA: Diagnosis not present

## 2017-03-28 LAB — CBC
HEMATOCRIT: 37 % (ref 36.0–46.0)
HEMATOCRIT: 39 % (ref 36.0–46.0)
HEMOGLOBIN: 11.8 g/dL — AB (ref 12.0–15.0)
HEMOGLOBIN: 12.3 g/dL (ref 12.0–15.0)
MCH: 30.5 pg (ref 26.0–34.0)
MCH: 30.8 pg (ref 26.0–34.0)
MCHC: 31.5 g/dL (ref 30.0–36.0)
MCHC: 31.9 g/dL (ref 30.0–36.0)
MCV: 96.6 fL (ref 78.0–100.0)
MCV: 96.8 fL (ref 78.0–100.0)
Platelets: 250 10*3/uL (ref 150–400)
Platelets: 259 10*3/uL (ref 150–400)
RBC: 3.83 MIL/uL — ABNORMAL LOW (ref 3.87–5.11)
RBC: 4.03 MIL/uL (ref 3.87–5.11)
RDW: 13.6 % (ref 11.5–15.5)
RDW: 13.5 % (ref 11.5–15.5)
WBC: 6.9 10*3/uL (ref 4.0–10.5)
WBC: 8.1 10*3/uL (ref 4.0–10.5)

## 2017-03-28 LAB — BASIC METABOLIC PANEL
ANION GAP: 3 — AB (ref 5–15)
BUN: 17 mg/dL (ref 6–20)
CALCIUM: 8.3 mg/dL — AB (ref 8.9–10.3)
CHLORIDE: 112 mmol/L — AB (ref 101–111)
CO2: 26 mmol/L (ref 22–32)
Creatinine, Ser: 1.08 mg/dL — ABNORMAL HIGH (ref 0.44–1.00)
GFR calc non Af Amer: 45 mL/min — ABNORMAL LOW (ref >60)
GFR, EST AFRICAN AMERICAN: 52 mL/min — AB (ref >60)
GLUCOSE: 89 mg/dL (ref 65–99)
POTASSIUM: 3.4 mmol/L — AB (ref 3.5–5.1)
Sodium: 141 mmol/L (ref 135–145)

## 2017-03-28 LAB — COMPREHENSIVE METABOLIC PANEL
ALK PHOS: 84 U/L (ref 38–126)
ALT: 10 U/L — AB (ref 14–54)
AST: 27 U/L (ref 15–41)
Albumin: 3.3 g/dL — ABNORMAL LOW (ref 3.5–5.0)
Anion gap: 8 (ref 5–15)
BUN: 18 mg/dL (ref 6–20)
CALCIUM: 8.3 mg/dL — AB (ref 8.9–10.3)
CHLORIDE: 108 mmol/L (ref 101–111)
CO2: 21 mmol/L — ABNORMAL LOW (ref 22–32)
CREATININE: 1.11 mg/dL — AB (ref 0.44–1.00)
GFR, EST AFRICAN AMERICAN: 50 mL/min — AB (ref >60)
GFR, EST NON AFRICAN AMERICAN: 43 mL/min — AB (ref >60)
Glucose, Bld: 177 mg/dL — ABNORMAL HIGH (ref 65–99)
Potassium: 4.5 mmol/L (ref 3.5–5.1)
Sodium: 137 mmol/L (ref 135–145)
Total Bilirubin: 1.1 mg/dL (ref 0.3–1.2)
Total Protein: 5.8 g/dL — ABNORMAL LOW (ref 6.5–8.1)

## 2017-03-28 LAB — MAGNESIUM: Magnesium: 1.9 mg/dL (ref 1.7–2.4)

## 2017-03-28 MED ORDER — POTASSIUM CHLORIDE CRYS ER 20 MEQ PO TBCR
40.0000 meq | EXTENDED_RELEASE_TABLET | Freq: Once | ORAL | Status: AC
  Administered 2017-03-28: 40 meq via ORAL
  Filled 2017-03-28: qty 2

## 2017-03-28 NOTE — Care Management Obs Status (Signed)
[place] NOTIFICATION   Patient Details  Name: [name] MRN: [id number] Date of Birth: Jul 24, 1927   Medicare Observation Status Notification Given:  Yes    Carles Collet, RN 03/28/2017, 9:51 AM

## 2017-03-28 NOTE — Progress Notes (Signed)
Patient was admitted and oriented to the unit. Vitals are stable and patient has no complaints of pain.   Drue Flirt, RN

## 2017-03-28 NOTE — Care Management Note (Signed)
Case Management Note  Patient Details  Name: [name] MRN: [id number] Date of Birth: [birth year]  Subjective/Objective:  BRBPR                 Action/Plan: Patient lives at home alone; PCP is Dr Maudie Mercury; has private insurance with Medicare/ BCBS with prescription drug coverage; pharmacy of choice is [facility]; she continues to drive her car, stated " I put gas in it too." Shr remains active, continues to cook; DME - walker; she also states that her neighbor helps her as needed; No needs identified at this time.  Expected Discharge Date:   possibly 03/28/2017               Expected Discharge Plan:  Home/Self Care  Discharge planning Services  CM Consult  Status of Service:  In process, will continue to follow  Sherrilyn Rist [phone] 03/28/2017, 11:02 AM

## 2017-03-28 NOTE — Progress Notes (Signed)
Subjective: Scant bleeding, overall much improved. No abdominal pain.  Objective: Vital signs in last 24 hours: Temp:  [97.5 F (36.4 C)-97.8 F (36.6 C)] 97.5 F (36.4 C) (11/30 0422) Pulse Rate:  [59-77] 59 (11/30 0422) Resp:  [15-24] 18 (11/30 0422) BP: (96-126)/(49-85) 107/85 (11/30 0422) SpO2:  [93 %-98 %] 96 % (11/30 0422) Weight:  [190 lb (86.2 kg)-192 lb 6.4 oz (87.3 kg)] 190 lb (86.2 kg) (11/30 0422) Weight change:  Last BM Date: 03/27/17  PE: GEN:  Overweight, elderly ABD:  Protuberant, soft, non-tender  Lab Results: CBC    Component Value Date/Time   WBC 6.9 03/28/2017 0841   RBC 4.03 03/28/2017 0841   HGB 12.3 03/28/2017 0841   HGB 15.3 09/19/2016 1002   HCT 39.0 03/28/2017 0841   HCT 45.5 09/19/2016 1002   PLT 259 03/28/2017 0841   PLT 226 09/19/2016 1002   MCV 96.8 03/28/2017 0841   MCV 93.6 09/19/2016 1002   MCH 30.5 03/28/2017 0841   MCHC 31.5 03/28/2017 0841   RDW 13.5 03/28/2017 0841   RDW 12.7 09/19/2016 1002   LYMPHSABS 1.5 01/31/2017 1307   LYMPHSABS 1.1 09/19/2016 1002   MONOABS 0.6 01/31/2017 1307   MONOABS 0.6 09/19/2016 1002   EOSABS 0.4 01/31/2017 1307   EOSABS 0.5 09/19/2016 1002   BASOSABS 0.0 01/31/2017 1307   BASOSABS 0.1 09/19/2016 1002   Assessment:  1.  Painless hematochezia, much improved, suspect colonic diverticulosis. 2.  Possible cirrhosis, based on CT imaging. 3.  Acute blood loss anemia, mild and resolved.  Plan:  1.  Follow CBCs. 2.  Advance diet. 3.  If no further bleeding and does well with advancing of diet, would consider discharge home tomorrow from GI perspective. 4.  Will need follow-up with Dr. [name], both for GI bleed and evaluation of possible cirrhosis. 5.  Howie Ill will sign-off; please call with questions; thank you for the consultation.   Landry Dyke 03/28/2017, 10:45 AM   Cell [phone] If no answer or after 5 PM call [phone]

## 2017-03-28 NOTE — Progress Notes (Signed)
Patient Demographics:    [name], is a 83 y.o. female, DOB - 1927-10-15, IWP:[id number]  Admit date - 03/27/2017   Admitting Physician Etta Quill, DO  Outpatient Primary MD for the patient is [name], MD  LOS - 0   Chief Complaint  Patient presents with  . Abdominal Pain  . Rectal Bleeding        Subjective:    [name] today has no fevers, no emesis,  No chest pain, had scant amount of blood staining on pad this a.m., no gross bleeding no clots  Assessment  & Plan :    Principal Problem:   BRBPR (bright red blood per rectum) Active Problems:   Acute GI bleeding  Brief summary:- 83 year old female with past medical history relevant for diverticulosis and prior history of diverticulitis as well previous history of bright red blood per rectum with suspected diverticular bleed at that time who was admitted on 03/27/2017 with bright red blood per rectum/painless hematochezia with recent concerns about recurrent diverticular bleed   PlaN; 1)Painless hematochezia-suspect diverticular origin H&H is stable, as per GI service/Dr. Arta Silence discharge until 03/29/2017, advance diet and follow H&H.  Recall GI if H&H drops significantly or if any signs of significant active bleeding  2) chronic pancreatic insufficiency-continue Creon  3)Liver Cirrhosis-P abdominal imaging studies, follow-up as outpatient with Dr. [name]  4)Hypothyroidism-levothyroxine as ordered  Disposition Plan  : As Per Dr [name] Hold discharge until 03/29/2017, okay to discharge home on 03/29/2017 if hemodynamically stable, H&H is stable and no further rectal bleeding  Consults  :  Gi   DVT Prophylaxis  :    SCDs    Lab Results  Component Value Date   PLT 259 03/28/2017    Inpatient Medications  Scheduled Meds: . letrozole  2.5 mg Oral Q breakfast  . levothyroxine  50 mcg Oral QAC breakfast  .  lipase/protease/amylase  36,000 Units Oral TID AC  . midodrine  2.5 mg Oral QPC breakfast   Continuous Infusions: PRN Meds:.acetaminophen **OR** acetaminophen, ondansetron **OR** ondansetron (ZOFRAN) IV   Anti-infectives (From admission, onward)   None        Objective:   Vitals:   03/27/17 1816 03/27/17 2007 03/28/17 0422 03/28/17 1208  BP: 120/66 126/64 107/85 108/60  Pulse: 61 64 (!) 59 66  Resp: 19 20 18 18   Temp:  97.8 F (36.6 C) (!) 97.5 F (36.4 C) 97.6 F (36.4 C)  TempSrc:  Oral Oral Oral  SpO2: 98%  96% 97%  Weight:  87.3 kg (192 lb 6.4 oz) 86.2 kg (190 lb)   Height:  5\' 7"  (1.702 m)      Wt Readings from Last 3 Encounters:  03/28/17 86.2 kg (190 lb)  01/31/17 88.9 kg (196 lb)  10/13/16 90.9 kg (200 lb 8 oz)     Intake/Output Summary (Last 24 hours) at 03/28/2017 1444 Last data filed at 03/28/2017 1003 Gross per 24 hour  Intake 680 ml  Output 300 ml  Net 380 ml     Physical Exam  Gen:- Awake Alert,  In no apparent distress  HEENT:- [place].AT, No sclera icterus Neck-Supple Neck,No JVD,.  Lungs-  CTAB  CV- S1, S2 normal Abd-  +ve B.Sounds, Abd Soft,  No tenderness,    Extremity/Skin:- No  edema,    Psych-appropriate affect    Data Review:   Micro Results No results found for this or any previous visit (from the past 240 hour(s)).  Radiology Reports Ct Abdomen Pelvis W Contrast  Result Date: 03/27/2017 CLINICAL DATA:  Upper abdominal pain. Rectal bleeding. History of breast cancer, RIGHT nephrectomy, cholecystectomy and hysterectomy. EXAM: CT ABDOMEN AND PELVIS WITH CONTRAST TECHNIQUE: Multidetector CT imaging of the abdomen and pelvis was performed using the standard protocol following bolus administration of intravenous contrast. CONTRAST:  193mL ISOVUE-300 IOPAMIDOL (ISOVUE-300) INJECTION 61% COMPARISON:  CT abdomen and pelvis October 13, 2016 FINDINGS: LOWER CHEST: Bibasilar similar atelectasis/ scarring. Included heart size is borderline  enlarged. Pacemaker wires. No pericardial effusion. HEPATOBILIARY: Similar borderline hepatomegaly with prominent LEFT lobe slightly nodular contour. Focal fatty infiltration about the falciform ligament. Status post cholecystectomy. PANCREAS: Normal. SPLEEN: Normal. ADRENALS/URINARY TRACT: Status post LEFT nephrectomy with calcified fat necrosis in surgical bed. Mild compensatory RIGHT nephromegaly with homogeneously hypodense benign-appearing renal cysts measuring to 4 cm. Additional too small to characterize hypodensities. Upper pole scarring. No nephrolithiasis or hydronephrosis. No solid renal mass. Delayed phase demonstrates prompt excretion of contrast into the proximal urinary collecting system. Urinary bladder is partially distended unremarkable. STOMACH/BOWEL: The stomach, small and large bowel are normal in course and caliber without inflammatory changes, sensitivity decreased without oral contrast. Severe colonic diverticulosis. Duodenum diverticulum. Small calcified bone fragment in small bowel, appearance of gallstone though, unlikely given cholecystectomy. VASCULAR/LYMPHATIC: Aortoiliac vessels are normal in caliber, tortuous course. Mild calcific atherosclerosis. No lymphadenopathy by CT size criteria. REPRODUCTIVE: Status post hysterectomy. OTHER: No intraperitoneal free fluid or free air. MUSCULOSKELETAL: Nonacute. Anterior abdominal wall laxity. Moderate L4-5 degenerative disc. RIGHT breast scarring and skin thickening, incompletely imaged. IMPRESSION: 1. Severe colonic diverticulosis without acute diverticulitis or acute intra-abdominal/pelvic process. 2. Borderline hepatomegaly and early suspected cirrhosis. Recommend correlation with liver function test. 3. RIGHT breast skin thickening and scarring, likely treatment related though, recommend direct inspection . Electronically Signed   By: Elon Alas M.D.   On: 03/27/2017 04:15     CBC Recent Labs  Lab 03/27/17 0500 03/27/17 0800  03/27/17 1540 03/27/17 2330 03/28/17 0841  WBC 7.4 7.1 8.1 8.1 6.9  HGB 12.6 12.9 12.0 11.8* 12.3  HCT 38.8 39.6 36.8 37.0 39.0  PLT 221 255 253 250 259  MCV 96.8 95.9 95.1 96.6 96.8  MCH 31.4 31.2 31.0 30.8 30.5  MCHC 32.5 32.6 32.6 31.9 31.5  RDW 13.2 13.2 13.3 13.6 13.5    Chemistries  Recent Labs  Lab 03/26/17 1821 03/28/17 0455  NA 140 141  K 3.8 3.4*  CL 106 112*  CO2 23 26  GLUCOSE 93 89  BUN 22* 17  CREATININE 1.08* 1.08*  CALCIUM 8.7* 8.3*  MG  --  1.9  AST 18  --   ALT 13*  --   ALKPHOS 100  --   BILITOT 0.5  --    ------------------------------------------------------------------------------------------------------------------ No results for input(s): CHOL, HDL, LDLCALC, TRIG, CHOLHDL, LDLDIRECT in the last 72 hours.  Lab Results  Component Value Date   HGBA1C 6.0 (H) 08/11/2015   ------------------------------------------------------------------------------------------------------------------ No results for input(s): TSH, T4TOTAL, T3FREE, THYROIDAB in the last 72 hours.  Invalid input(s): FREET3 ------------------------------------------------------------------------------------------------------------------ No results for input(s): VITAMINB12, FOLATE, FERRITIN, TIBC, IRON, RETICCTPCT in the last 72 hours.  Coagulation profile Recent Labs  Lab 03/27/17 0213  INR 0.98    No results for input(s): DDIMER in the  last 72 hours.  Cardiac Enzymes No results for input(s): CKMB, TROPONINI, MYOGLOBIN in the last 168 hours.  Invalid input(s): CK ------------------------------------------------------------------------------------------------------------------ No results found for: BNP   Roxan Hockey M.D on 03/28/2017 at 2:44 PM  Between 7am to 7pm - Pager - [phone]  After 7pm go to [email or web address] - password [email or web address]  [facility] -  Office  [phone]   Voice Recognition Viviann Spare dictation system was used to create this note, attempts  have been made to correct errors. Please contact the author with questions and/or clarifications.

## 2017-03-28 NOTE — Plan of Care (Signed)
Nutrition Education Note  RD consulted for nutrition education regarding the "diverticular diet"  RD provided "Fiber-Restricted Nutrition Therapy" and "High-fiber Nutrition Therapy" handouts from the Academy of [employer] and [employer]etics.   Reviewed patient's dietary recall. For breakfast pt consumes either cereal or boiled egg, toast and small donut, for lunch a cafeteria plate of [place], stuffing, mac and cheese and yams occasional green beans or beets, and [place] sandwich with milk and piece of pie for dinner.   Pt's diet has been advanced from NPO then clear liquids to soft this morning  Educated pt on slowly beginning a low-fiber diet until inflammation and bleeding subside and are not a risk. Then to slowly incorporate fiber (to ensure tolerance) and adequate fluid nutrition. Pt has been scared to consume fiber stating "wait, I am allowed to eat apples and beans?"  Educated pt to pay attention to her symptoms and eat accordingly while consuming adequate hydration. Discussed and provided examples of dietary fibers.   RD discussed why it is important for patient to adhere to diet recommendations, and emphasized the role of fluids, foods to avoid, and foods to incorporate. Teach back method used.  Expect fair compliance.  Body mass index is 29.76 kg/m. Pt meets criteria for overweight based on current BMI. Pt reports intentional weight loss of two pounds.   Current diet order is soft, patient is consuming approximately 100% of meals at this time. Pt reports a good appetite PTA and currently.   Labs and medications reviewed. No further nutrition interventions warranted at this time. RD contact information provided. If additional nutrition issues arise, please re-consult RD.   Parks Ranger, MS, RDN, LDN 03/28/2017 3:59 PM

## 2017-03-28 NOTE — Consult Note (Signed)
   [facility] CM Inpatient Consult   03/28/2017  [name] [birth year] [id number]   Patient screened for potential [facility] services for high re-admission risk. Patient is in the  Medicare/ACO plan. Met with the patient at the bedside.  Patient endorses Dr. Jani [name],  is her primary care provider. This provider is listed to provide post hospital transition of care follow up.  Patient states she drives and has no issues with her medications. General EMMI calls for high risk added.   Please place a [facility] Care Management consult or for questions contact:   Nichole Brood, RN BSN [facility] Liaison  [phone] business mobile phone Toll free office [phone]

## 2017-03-29 LAB — CBC
HEMATOCRIT: 36.4 % (ref 36.0–46.0)
HEMOGLOBIN: 11.7 g/dL — AB (ref 12.0–15.0)
MCH: 31 pg (ref 26.0–34.0)
MCHC: 32.1 g/dL (ref 30.0–36.0)
MCV: 96.6 fL (ref 78.0–100.0)
PLATELETS: 251 10*3/uL (ref 150–400)
RBC: 3.77 MIL/uL — AB (ref 3.87–5.11)
RDW: 13.6 % (ref 11.5–15.5)
WBC: 7.5 10*3/uL (ref 4.0–10.5)

## 2017-03-29 NOTE — Progress Notes (Signed)
Discharge instructions given. Pt verbalized understanding and all questions were answered.  

## 2017-03-29 NOTE — Discharge Summary (Signed)
Physician Discharge Summary  [name] YTK:[id number] DOB: 08-Oct-1927 DOA: 03/27/2017  PCP: [name], MD  Admit date: 03/27/2017 Discharge date: 03/29/2017  Admitted From: Home Disposition: Home   Recommendations for Outpatient Follow-up:  1. [facility] 2. Recheck CBC  Home Health: None Equipment/Devices: None Discharge Condition: Stable CODE STATUS: Partial (DNI) In the event of cardiac or respiratory ARREST: Initiate Code Blue, Call Rapid Response Yes  In the event of cardiac or respiratory ARREST: Perform CPR Yes  In the event of cardiac or respiratory ARREST: Perform Intubation/Mechanical Ventilation No  In the event of cardiac or respiratory ARREST: Use NIPPV/BiPAp only if indicated Yes  In the event of cardiac or respiratory ARREST: Administer ACLS medications if indicated Yes  In the event of cardiac or respiratory ARREST: Perform Defibrillation or Cardioversion if indicated Yes   Diet recommendation: Soft as tolerated  Brief/Interim Summary: 83 year old female with past medical history relevant for diverticulosis and prior history of diverticulitis as well previous history of bright red blood per rectum with suspected diverticular bleed at that time who was admitted on 03/27/2017 with bright red blood per rectum/painless hematochezia with recent concerns about recurrent diverticular bleed. [facility] was consulted, recommended observation. Blood counts have remained stable with very mild anemia and bleeding has clinically improved. She will be discharged with follow up with plans to follow up with Dr. [name]  Discharge Diagnoses:  Principal Problem:   BRBPR (bright red blood per rectum) Active Problems:   Acute GI bleeding   GI bleed  Painless hematochezia: Resolving, hemoglobin stable at 11.7 without symptoms of anemia, tolerating advanced diet. Per GI, stable for discharge.  - Follow up with [facility], Dr. [name], as outpatient for diverticulosis  and possible cirrhosis (based on imaging study) - Will hold aspirin until follow up (unclear indication)  Chronic pancreatic insufficiency: Stable.  - Continue creon  Hypothyroidism: Last TSH 3.612.  - Continue synthroid  Hypotension: Chronic, controlled - Continue midodrine  Breast CA: In remission - Continue femara  Heart block s/p PPM: MDT single chamber PPM implanted 2014, gen changed 2016. - Stable  Discharge Instructions Discharge Instructions    Discharge instructions   Complete by:  As directed    Hold the aspirin for now Seek medical attention if rectal bleeding worsens/returns Call [facility] for a follow up appointment with Dr. [name] in the next 1 - 2 weeks.   Increase activity slowly   Complete by:  As directed      Allergies as of 03/29/2017      Reactions   Erythromycin Hives   Tape Other (See Comments)   BAND AIDS-SKIN IRRITATION   Codeine Nausea And Vomiting   Flagyl [metronidazole] Hives   Macrobid [nitrofurantoin Macrocrystal] Nausea And Vomiting   Tolterodine Nausea And Vomiting   Ciprofloxacin Hives, Rash      Medication List    STOP taking these medications   aspirin EC 81 MG tablet     TAKE these medications   CALTRATE 600+D PO Take 600 mg by mouth daily after breakfast.   CREON 36000 UNITS Cpep capsule Generic drug:  lipase/protease/amylase Take 36,000 Units by mouth 3 (three) times daily before meals.   furosemide 20 MG tablet Commonly known as:  LASIX Take 20 mg by mouth daily with breakfast.   letrozole 2.5 MG tablet Commonly known as:  FEMARA Take 1 tablet (2.5 mg total) by mouth daily. What changed:  when to take this   levothyroxine 50 MCG  tablet Commonly known as:  SYNTHROID, LEVOTHROID Take 50 mcg by mouth daily before breakfast.   midodrine 2.5 MG tablet Commonly known as:  PROAMATINE Take 2.5 mg by mouth daily after breakfast.   PROBIOTIC PO Take 1 tablet by mouth daily with breakfast.      Follow-up  Information    [name], MD Follow up in 1 week(s).   Specialty:  Internal Medicine Why:  hospital discharge follow up Contact information: [address] [place] [address] [phone]        Wilford Corner, MD. Schedule an appointment as soon as possible for a visit.   Specialty:  Gastroenterology Contact information: [address] [place] [address] [phone]          Allergies  Allergen Reactions  . Erythromycin Hives  . Tape Other (See Comments)    BAND AIDS-SKIN IRRITATION  . Codeine Nausea And Vomiting  . Flagyl [Metronidazole] Hives  . Macrobid [Nitrofurantoin Macrocrystal] Nausea And Vomiting  . Tolterodine Nausea And Vomiting  . Ciprofloxacin Hives and Rash    Consultations:  [facility], Dr. [name]  Procedures/Studies: Ct Abdomen Pelvis W Contrast  Result Date: 03/27/2017 CLINICAL DATA:  Upper abdominal pain. Rectal bleeding. History of breast cancer, RIGHT nephrectomy, cholecystectomy and hysterectomy. EXAM: CT ABDOMEN AND PELVIS WITH CONTRAST TECHNIQUE: Multidetector CT imaging of the abdomen and pelvis was performed using the standard protocol following bolus administration of intravenous contrast. CONTRAST:  137mL ISOVUE-300 IOPAMIDOL (ISOVUE-300) INJECTION 61% COMPARISON:  CT abdomen and pelvis October 13, 2016 FINDINGS: LOWER CHEST: Bibasilar similar atelectasis/ scarring. Included heart size is borderline enlarged. Pacemaker wires. No pericardial effusion. HEPATOBILIARY: Similar borderline hepatomegaly with prominent LEFT lobe slightly nodular contour. Focal fatty infiltration about the falciform ligament. Status post cholecystectomy. PANCREAS: Normal. SPLEEN: Normal. ADRENALS/URINARY TRACT: Status post LEFT nephrectomy with calcified fat necrosis in surgical bed. Mild compensatory RIGHT nephromegaly with homogeneously hypodense benign-appearing renal cysts measuring to 4 cm. Additional too small to characterize  hypodensities. Upper pole scarring. No nephrolithiasis or hydronephrosis. No solid renal mass. Delayed phase demonstrates prompt excretion of contrast into the proximal urinary collecting system. Urinary bladder is partially distended unremarkable. STOMACH/BOWEL: The stomach, small and large bowel are normal in course and caliber without inflammatory changes, sensitivity decreased without oral contrast. Severe colonic diverticulosis. Duodenum diverticulum. Small calcified bone fragment in small bowel, appearance of gallstone though, unlikely given cholecystectomy. VASCULAR/LYMPHATIC: Aortoiliac vessels are normal in caliber, tortuous course. Mild calcific atherosclerosis. No lymphadenopathy by CT size criteria. REPRODUCTIVE: Status post hysterectomy. OTHER: No intraperitoneal free fluid or free air. MUSCULOSKELETAL: Nonacute. Anterior abdominal wall laxity. Moderate L4-5 degenerative disc. RIGHT breast scarring and skin thickening, incompletely imaged. IMPRESSION: 1. Severe colonic diverticulosis without acute diverticulitis or acute intra-abdominal/pelvic process. 2. Borderline hepatomegaly and early suspected cirrhosis. Recommend correlation with liver function test. 3. RIGHT breast skin thickening and scarring, likely treatment related though, recommend direct inspection . Electronically Signed   By: Elon Alas M.D.   On: 03/27/2017 04:15   Subjective: Stool is firming up, had scant red blood with wiping this morning, much improved from admission. Would rather go home and understands return precautions for ongoing or increasing red blood per rectum.   Discharge Exam: Vitals:   03/28/17 1947 03/29/17 0429  BP: 96/65 113/82  Pulse: 65 87  Resp: 18 18  Temp: 98 F (36.7 C) 97.8 F (36.6 C)  SpO2: 98% 98%   General: Pt is alert, awake, not in acute distress Cardiovascular:  RRR, S1/S2 +, no rubs, no gallops Respiratory: CTA bilaterally, no wheezing, no rhonchi Abdominal: Soft, NT, ND, bowel  sounds + Extremities: No edema, no cyanosis  Labs: Basic Metabolic Panel: Recent Labs  Lab 03/26/17 1821 03/28/17 0455 03/28/17 1458  NA 140 141 137  K 3.8 3.4* 4.5  CL 106 112* 108  CO2 23 26 21*  GLUCOSE 93 89 177*  BUN 22* 17 18  CREATININE 1.08* 1.08* 1.11*  CALCIUM 8.7* 8.3* 8.3*  MG  --  1.9  --    Liver Function Tests: Recent Labs  Lab 03/26/17 1821 03/28/17 1458  AST 18 27  ALT 13* 10*  ALKPHOS 100 84  BILITOT 0.5 1.1  PROT 6.3* 5.8*  ALBUMIN 3.5 3.3*   Recent Labs  Lab 03/26/17 1821  LIPASE 28   CBC: Recent Labs  Lab 03/27/17 0800 03/27/17 1540 03/27/17 2330 03/28/17 0841 03/29/17 0416  WBC 7.1 8.1 8.1 6.9 7.5  HGB 12.9 12.0 11.8* 12.3 11.7*  HCT 39.6 36.8 37.0 39.0 36.4  MCV 95.9 95.1 96.6 96.8 96.6  PLT 255 253 250 259 251   Urinalysis    Component Value Date/Time   COLORURINE YELLOW 03/26/2017 [place] 03/26/2017 1819   LABSPEC >1.046 (H) 03/26/2017 1819   PHURINE 6.0 03/26/2017 1819   GLUCOSEU NEGATIVE 03/26/2017 1819   HGBUR LARGE (A) 03/26/2017 1819   BILIRUBINUR NEGATIVE 03/26/2017 1819   KETONESUR NEGATIVE 03/26/2017 1819   PROTEINUR NEGATIVE 03/26/2017 1819   UROBILINOGEN 0.2 04/12/2014 1928   NITRITE NEGATIVE 03/26/2017 1819   LEUKOCYTESUR NEGATIVE 03/26/2017 1819    Time coordinating discharge: Approximately 40 minutes  Vance Gather, MD  [facility] 03/29/2017, 5:54 PM Pager [phone]

## 2017-03-29 NOTE — Progress Notes (Signed)
Physician Discharge Summary  [name] GEX:[id number] DOB: 1927-08-08 DOA: 03/27/2017  PCP: Nichole Gravel, MD  Admit date: 03/27/2017 Discharge date: 03/29/2017  Admitted From: Home Disposition: Home   Recommendations for Outpatient Follow-up:  1. [facility] 2. Recheck CBC  Home Health: None Equipment/Devices: None Discharge Condition: Stable CODE STATUS: Partial (DNI) In the event of cardiac or respiratory ARREST: Initiate Code Blue, Call Rapid Response Yes  In the event of cardiac or respiratory ARREST: Perform CPR Yes  In the event of cardiac or respiratory ARREST: Perform Intubation/Mechanical Ventilation No  In the event of cardiac or respiratory ARREST: Use NIPPV/BiPAp only if indicated Yes  In the event of cardiac or respiratory ARREST: Administer ACLS medications if indicated Yes  In the event of cardiac or respiratory ARREST: Perform Defibrillation or Cardioversion if indicated Yes   Diet recommendation: Soft as tolerated  Brief/Interim Summary: 83 year old female with past medical history relevant for diverticulosis and prior history of diverticulitis as well previous history of bright red blood per rectum with suspected diverticular bleed at that time who was admitted on 03/27/2017 with bright red blood per rectum/painless hematochezia with recent concerns about recurrent diverticular bleed. [facility] was consulted, recommended observation. Blood counts have remained stable with very mild anemia and bleeding has clinically improved. She will be discharged with follow up with plans to follow up with Dr. Michail [name]  Discharge Diagnoses:  Principal Problem:   BRBPR (bright red blood per rectum) Active Problems:   Acute GI bleeding   GI bleed  Painless hematochezia: Resolving, hemoglobin stable at 11.7 without symptoms of anemia, tolerating advanced diet. Per GI, stable for discharge.  - Follow up with [facility], Dr. Michail [name], as outpatient for diverticulosis  and possible cirrhosis (based on imaging study) - Will hold aspirin until follow up (unclear indication)  Chronic pancreatic insufficiency: Stable.  - Continue creon  Hypothyroidism: Last TSH 3.612.  - Continue synthroid  Hypotension: Chronic, controlled - Continue midodrine  Breast CA: In remission - Continue femara  Heart block s/p PPM: MDT single chamber PPM implanted 2014, gen changed 2016. - Stable  Discharge Instructions Discharge Instructions    Discharge instructions   Complete by:  As directed    Hold the aspirin for now Seek medical attention if rectal bleeding worsens/returns Call [facility] for a follow up appointment with Dr. Michail [name] in the next 1 - 2 weeks.   Increase activity slowly   Complete by:  As directed      Allergies as of 03/29/2017      Reactions   Erythromycin Hives   Tape Other (See Comments)   BAND AIDS-SKIN IRRITATION   Codeine Nausea And Vomiting   Flagyl [metronidazole] Hives   Macrobid [nitrofurantoin Macrocrystal] Nausea And Vomiting   Tolterodine Nausea And Vomiting   Ciprofloxacin Hives, Rash      Medication List    STOP taking these medications   aspirin EC 81 MG tablet     TAKE these medications   CALTRATE 600+D PO Take 600 mg by mouth daily after breakfast.   CREON 36000 UNITS Cpep capsule Generic drug:  lipase/protease/amylase Take 36,000 Units by mouth 3 (three) times daily before meals.   furosemide 20 MG tablet Commonly known as:  LASIX Take 20 mg by mouth daily with breakfast.   letrozole 2.5 MG tablet Commonly known as:  FEMARA Take 1 tablet (2.5 mg total) by mouth daily. What changed:  when to take this   levothyroxine 50 MCG  tablet Commonly known as:  SYNTHROID, LEVOTHROID Take 50 mcg by mouth daily before breakfast.   midodrine 2.5 MG tablet Commonly known as:  PROAMATINE Take 2.5 mg by mouth daily after breakfast.   PROBIOTIC PO Take 1 tablet by mouth daily with breakfast.      Follow-up  Information    Nichole Gravel, MD Follow up in 1 week(s).   Specialty:  Internal Medicine Why:  hospital discharge follow up Contact information: [address] [place] [address] [phone]        Wilford Corner, MD. Schedule an appointment as soon as possible for a visit.   Specialty:  Gastroenterology Contact information: [address] [place] [address] [phone]          Allergies  Allergen Reactions  . Erythromycin Hives  . Tape Other (See Comments)    BAND AIDS-SKIN IRRITATION  . Codeine Nausea And Vomiting  . Flagyl [Metronidazole] Hives  . Macrobid [Nitrofurantoin Macrocrystal] Nausea And Vomiting  . Tolterodine Nausea And Vomiting  . Ciprofloxacin Hives and Rash    Consultations:  [facility], Dr. Paulita [name]  Procedures/Studies: Ct Abdomen Pelvis W Contrast  Result Date: 03/27/2017 CLINICAL DATA:  Upper abdominal pain. Rectal bleeding. History of breast cancer, RIGHT nephrectomy, cholecystectomy and hysterectomy. EXAM: CT ABDOMEN AND PELVIS WITH CONTRAST TECHNIQUE: Multidetector CT imaging of the abdomen and pelvis was performed using the standard protocol following bolus administration of intravenous contrast. CONTRAST:  136mL ISOVUE-300 IOPAMIDOL (ISOVUE-300) INJECTION 61% COMPARISON:  CT abdomen and pelvis October 13, 2016 FINDINGS: LOWER CHEST: Bibasilar similar atelectasis/ scarring. Included heart size is borderline enlarged. Pacemaker wires. No pericardial effusion. HEPATOBILIARY: Similar borderline hepatomegaly with prominent LEFT lobe slightly nodular contour. Focal fatty infiltration about the falciform ligament. Status post cholecystectomy. PANCREAS: Normal. SPLEEN: Normal. ADRENALS/URINARY TRACT: Status post LEFT nephrectomy with calcified fat necrosis in surgical bed. Mild compensatory RIGHT nephromegaly with homogeneously hypodense benign-appearing renal cysts measuring to 4 cm. Additional too small to characterize  hypodensities. Upper pole scarring. No nephrolithiasis or hydronephrosis. No solid renal mass. Delayed phase demonstrates prompt excretion of contrast into the proximal urinary collecting system. Urinary bladder is partially distended unremarkable. STOMACH/BOWEL: The stomach, small and large bowel are normal in course and caliber without inflammatory changes, sensitivity decreased without oral contrast. Severe colonic diverticulosis. Duodenum diverticulum. Small calcified bone fragment in small bowel, appearance of gallstone though, unlikely given cholecystectomy. VASCULAR/LYMPHATIC: Aortoiliac vessels are normal in caliber, tortuous course. Mild calcific atherosclerosis. No lymphadenopathy by CT size criteria. REPRODUCTIVE: Status post hysterectomy. OTHER: No intraperitoneal free fluid or free air. MUSCULOSKELETAL: Nonacute. Anterior abdominal wall laxity. Moderate L4-5 degenerative disc. RIGHT breast scarring and skin thickening, incompletely imaged. IMPRESSION: 1. Severe colonic diverticulosis without acute diverticulitis or acute intra-abdominal/pelvic process. 2. Borderline hepatomegaly and early suspected cirrhosis. Recommend correlation with liver function test. 3. RIGHT breast skin thickening and scarring, likely treatment related though, recommend direct inspection . Electronically Signed   By: Elon Alas M.D.   On: 03/27/2017 04:15   Subjective: Stool is firming up, had scant red blood with wiping this morning, much improved from admission. Would rather go home and understands return precautions for ongoing or increasing red blood per rectum.   Discharge Exam: Vitals:   03/28/17 1947 03/29/17 0429  BP: 96/65 113/82  Pulse: 65 87  Resp: 18 18  Temp: 98 F (36.7 C) 97.8 F (36.6 C)  SpO2: 98% 98%   General: Pt is alert, awake, not in acute distress Cardiovascular:  RRR, S1/S2 +, no rubs, no gallops Respiratory: CTA bilaterally, no wheezing, no rhonchi Abdominal: Soft, NT, ND, bowel  sounds + Extremities: No edema, no cyanosis  Labs: Basic Metabolic Panel: Recent Labs  Lab 03/26/17 1821 03/28/17 0455 03/28/17 1458  NA 140 141 137  K 3.8 3.4* 4.5  CL 106 112* 108  CO2 23 26 21*  GLUCOSE 93 89 177*  BUN 22* 17 18  CREATININE 1.08* 1.08* 1.11*  CALCIUM 8.7* 8.3* 8.3*  MG  --  1.9  --    Liver Function Tests: Recent Labs  Lab 03/26/17 1821 03/28/17 1458  AST 18 27  ALT 13* 10*  ALKPHOS 100 84  BILITOT 0.5 1.1  PROT 6.3* 5.8*  ALBUMIN 3.5 3.3*   Recent Labs  Lab 03/26/17 1821  LIPASE 28   CBC: Recent Labs  Lab 03/27/17 0800 03/27/17 1540 03/27/17 2330 03/28/17 0841 03/29/17 0416  WBC 7.1 8.1 8.1 6.9 7.5  HGB 12.9 12.0 11.8* 12.3 11.7*  HCT 39.6 36.8 37.0 39.0 36.4  MCV 95.9 95.1 96.6 96.8 96.6  PLT 255 253 250 259 251   Urinalysis    Component Value Date/Time   COLORURINE YELLOW 03/26/2017 [place] 03/26/2017 1819   LABSPEC >1.046 (H) 03/26/2017 1819   PHURINE 6.0 03/26/2017 1819   GLUCOSEU NEGATIVE 03/26/2017 1819   HGBUR LARGE (A) 03/26/2017 1819   BILIRUBINUR NEGATIVE 03/26/2017 1819   KETONESUR NEGATIVE 03/26/2017 1819   PROTEINUR NEGATIVE 03/26/2017 1819   UROBILINOGEN 0.2 04/12/2014 1928   NITRITE NEGATIVE 03/26/2017 1819   LEUKOCYTESUR NEGATIVE 03/26/2017 1819    Time coordinating discharge: Approximately 40 minutes  Vance Gather, MD  [facility] 03/29/2017, 11:51 AM Pager [phone]

## 2017-03-30 ENCOUNTER — Encounter (HOSPITAL_COMMUNITY): Payer: Self-pay | Admitting: *Deleted

## 2017-03-30 ENCOUNTER — Other Ambulatory Visit: Payer: Self-pay

## 2017-03-30 ENCOUNTER — Inpatient Hospital Stay (HOSPITAL_COMMUNITY)
Admission: EM | Admit: 2017-03-30 | Discharge: 2017-04-02 | DRG: 378 | Disposition: A | Discharge status: Home / Self Care | Payer: Medicare Other | Attending: Internal Medicine | Admitting: Internal Medicine

## 2017-03-30 DIAGNOSIS — Z853 Personal history of malignant neoplasm of breast: Secondary | ICD-10-CM

## 2017-03-30 DIAGNOSIS — D62 Acute posthemorrhagic anemia: Secondary | ICD-10-CM | POA: Diagnosis present

## 2017-03-30 DIAGNOSIS — Z881 Allergy status to other antibiotic agents status: Secondary | ICD-10-CM

## 2017-03-30 DIAGNOSIS — I442 Atrioventricular block, complete: Secondary | ICD-10-CM | POA: Diagnosis present

## 2017-03-30 DIAGNOSIS — I872 Venous insufficiency (chronic) (peripheral): Secondary | ICD-10-CM | POA: Diagnosis present

## 2017-03-30 DIAGNOSIS — E039 Hypothyroidism, unspecified: Secondary | ICD-10-CM | POA: Diagnosis not present

## 2017-03-30 DIAGNOSIS — K625 Hemorrhage of anus and rectum: Secondary | ICD-10-CM | POA: Diagnosis not present

## 2017-03-30 DIAGNOSIS — I951 Orthostatic hypotension: Secondary | ICD-10-CM | POA: Diagnosis present

## 2017-03-30 DIAGNOSIS — Z79899 Other long term (current) drug therapy: Secondary | ICD-10-CM

## 2017-03-30 DIAGNOSIS — E6609 Other obesity due to excess calories: Secondary | ICD-10-CM | POA: Diagnosis not present

## 2017-03-30 DIAGNOSIS — K5731 Diverticulosis of large intestine without perforation or abscess with bleeding: Secondary | ICD-10-CM | POA: Diagnosis not present

## 2017-03-30 DIAGNOSIS — Z6829 Body mass index (BMI) 29.0-29.9, adult: Secondary | ICD-10-CM

## 2017-03-30 DIAGNOSIS — Z885 Allergy status to narcotic agent status: Secondary | ICD-10-CM

## 2017-03-30 DIAGNOSIS — Z950 Presence of cardiac pacemaker: Secondary | ICD-10-CM

## 2017-03-30 DIAGNOSIS — Z905 Acquired absence of kidney: Secondary | ICD-10-CM

## 2017-03-30 DIAGNOSIS — Z888 Allergy status to other drugs, medicaments and biological substances status: Secondary | ICD-10-CM

## 2017-03-30 DIAGNOSIS — E7800 Pure hypercholesterolemia, unspecified: Secondary | ICD-10-CM | POA: Diagnosis present

## 2017-03-30 LAB — COMPREHENSIVE METABOLIC PANEL
ALBUMIN: 3.4 g/dL — AB (ref 3.5–5.0)
ALT: 12 U/L — ABNORMAL LOW (ref 14–54)
ANION GAP: 7 (ref 5–15)
AST: 18 U/L (ref 15–41)
Alkaline Phosphatase: 84 U/L (ref 38–126)
BILIRUBIN TOTAL: 0.2 mg/dL — AB (ref 0.3–1.2)
BUN: 24 mg/dL — ABNORMAL HIGH (ref 6–20)
CO2: 22 mmol/L (ref 22–32)
Calcium: 8.8 mg/dL — ABNORMAL LOW (ref 8.9–10.3)
Chloride: 110 mmol/L (ref 101–111)
Creatinine, Ser: 1.13 mg/dL — ABNORMAL HIGH (ref 0.44–1.00)
GFR calc Af Amer: 49 mL/min — ABNORMAL LOW (ref >60)
GFR calc non Af Amer: 42 mL/min — ABNORMAL LOW (ref >60)
GLUCOSE: 94 mg/dL (ref 65–99)
POTASSIUM: 3.9 mmol/L (ref 3.5–5.1)
SODIUM: 139 mmol/L (ref 135–145)
TOTAL PROTEIN: 5.8 g/dL — AB (ref 6.5–8.1)

## 2017-03-30 LAB — CBC
HCT: 33.6 % — ABNORMAL LOW (ref 36.0–46.0)
HCT: 29.8 % — ABNORMAL LOW (ref 36.0–46.0)
HEMATOCRIT: 31.1 % — AB (ref 36.0–46.0)
HEMOGLOBIN: 10.1 g/dL — AB (ref 12.0–15.0)
HEMOGLOBIN: 9.6 g/dL — AB (ref 12.0–15.0)
Hemoglobin: 10.8 g/dL — ABNORMAL LOW (ref 12.0–15.0)
MCH: 31.2 pg (ref 26.0–34.0)
MCH: 31.1 pg (ref 26.0–34.0)
MCH: 31.2 pg (ref 26.0–34.0)
MCHC: 32.5 g/dL (ref 30.0–36.0)
MCHC: 32.1 g/dL (ref 30.0–36.0)
MCHC: 32.2 g/dL (ref 30.0–36.0)
MCV: 96 fL (ref 78.0–100.0)
MCV: 96.8 fL (ref 78.0–100.0)
MCV: 96.8 fL (ref 78.0–100.0)
PLATELETS: 258 10*3/uL (ref 150–400)
Platelets: 250 10*3/uL (ref 150–400)
Platelets: 232 10*3/uL (ref 150–400)
RBC: 3.24 MIL/uL — AB (ref 3.87–5.11)
RBC: 3.47 MIL/uL — ABNORMAL LOW (ref 3.87–5.11)
RBC: 3.08 MIL/uL — AB (ref 3.87–5.11)
RDW: 13.7 % (ref 11.5–15.5)
RDW: 14.1 % (ref 11.5–15.5)
RDW: 13.9 % (ref 11.5–15.5)
WBC: 6.8 10*3/uL (ref 4.0–10.5)
WBC: 8.4 10*3/uL (ref 4.0–10.5)
WBC: 7.1 10*3/uL (ref 4.0–10.5)

## 2017-03-30 LAB — PROTIME-INR
INR: 0.96
PROTHROMBIN TIME: 12.7 s (ref 11.4–15.2)

## 2017-03-30 LAB — TYPE AND SCREEN
ABO/RH(D): O POS
ANTIBODY SCREEN: NEGATIVE

## 2017-03-30 MED ORDER — MIDODRINE HCL 5 MG PO TABS
2.5000 mg | ORAL_TABLET | Freq: Every day | ORAL | Status: DC
  Administered 2017-03-31 – 2017-04-02 (×3): 2.5 mg via ORAL
  Filled 2017-03-30 (×3): qty 1

## 2017-03-30 MED ORDER — LEVOTHYROXINE SODIUM 50 MCG PO TABS
50.0000 ug | ORAL_TABLET | Freq: Every day | ORAL | Status: DC
  Administered 2017-03-31 – 2017-04-02 (×3): 50 ug via ORAL
  Filled 2017-03-30 (×3): qty 1

## 2017-03-30 MED ORDER — PANCRELIPASE (LIP-PROT-AMYL) 12000-38000 UNITS PO CPEP
36000.0000 [IU] | ORAL_CAPSULE | Freq: Three times a day (TID) | ORAL | Status: DC
  Administered 2017-03-31 – 2017-04-02 (×8): 36000 [IU] via ORAL
  Filled 2017-03-30: qty 1
  Filled 2017-03-30 (×4): qty 3
  Filled 2017-03-30: qty 1
  Filled 2017-03-30: qty 3
  Filled 2017-03-30 (×3): qty 1

## 2017-03-30 MED ORDER — ACETAMINOPHEN 325 MG PO TABS: 650.0000 mg | ORAL_TABLET | Freq: Four times a day (QID) | ORAL | Status: DC | PRN

## 2017-03-30 MED ORDER — ONDANSETRON HCL 4 MG PO TABS: 4.0000 mg | ORAL_TABLET | Freq: Four times a day (QID) | ORAL | Status: DC | PRN

## 2017-03-30 MED ORDER — SODIUM CHLORIDE 0.9 % IV BOLUS (SEPSIS)
1000.0000 mL | Freq: Once | INTRAVENOUS | Status: AC
  Administered 2017-03-30: 1000 mL via INTRAVENOUS

## 2017-03-30 MED ORDER — SODIUM CHLORIDE 0.9 % IV SOLN
INTRAVENOUS | Status: DC
  Administered 2017-03-30: 10 mL/h via INTRAVENOUS

## 2017-03-30 MED ORDER — LETROZOLE 2.5 MG PO TABS
2.5000 mg | ORAL_TABLET | Freq: Every day | ORAL | Status: DC
  Administered 2017-03-31 – 2017-04-02 (×3): 2.5 mg via ORAL
  Filled 2017-03-30 (×3): qty 1

## 2017-03-30 MED ORDER — ONDANSETRON HCL 4 MG/2ML IJ SOLN: 4.0000 mg | Freq: Four times a day (QID) | INTRAMUSCULAR | Status: DC | PRN

## 2017-03-30 MED ORDER — ACETAMINOPHEN 650 MG RE SUPP: 650.0000 mg | Freq: Four times a day (QID) | RECTAL | Status: DC | PRN

## 2017-03-30 NOTE — ED Notes (Signed)
Phlebotomy contacted to draw labs

## 2017-03-30 NOTE — ED Provider Notes (Signed)
[place] EMERGENCY DEPARTMENT Provider Note   CSN: [id number] Arrival date & time: 03/30/17  1356     History   Chief Complaint Chief Complaint  Patient presents with  . Rectal Bleeding    HPI [name] is a 83 y.o. female.  The history is provided by the patient, medical records and a friend. No language interpreter was used.  Rectal Bleeding  Quality:  Bright red Amount:  Moderate Duration:  2 days Timing:  Constant Chronicity:  Recurrent Context: spontaneously   Context: not hemorrhoids   Similar prior episodes: yes   Relieved by:  Nothing Worsened by:  Nothing Ineffective treatments:  None tried Associated symptoms: no abdominal pain, no dizziness, no epistaxis, no fever, no light-headedness, no loss of consciousness, no recent illness and no vomiting   Risk factors: no anticoagulant use     Past Medical History:  Diagnosis Date  . Cataract   . Complete heart block (HCC)    s/p PPM implant (MDT) by Dr Blanch Media.  Atrial lead could not be paced at time of the procedure.  She has chronic AV dysociation  . Delusional disorder ([place])   . Exogenous obesity   . Hypercholesterolemia   . Invasive ductal carcinoma of breast ([place]) 03/2007   BILATERAL BREASTS  . Orthostatic hypotension    treated with midodrine by Dr Rollene [name]  . PONV (postoperative nausea and vomiting)   . Thyroid disease   . Venous insufficiency     Patient Active Problem List   Diagnosis Date Noted  . GI bleed 03/28/2017  . BRBPR (bright red blood per rectum) 03/27/2017  . Sigmoid diverticulitis 10/14/2016  . Diverticulitis 10/14/2016  . Varicose veins of bilateral lower extremities with other complications 59/56/3875  . Venous insufficiency 08/25/2015  . Cellulitis 08/09/2015  . Pressure ulcer 08/09/2015  . Delusional disorder ([place]) 07/05/2015  . Bereavement reaction 07/05/2015  . Pacemaker at end of battery life 02/23/2015  . CHB (complete heart block) ([place])     . Acute GI bleeding 02/15/2015  . Acute kidney injury (nontraumatic) ([place]) 02/15/2015  . Hypothyroidism 02/15/2015  . Left upper quadrant pain   . Osteopenia 12/22/2013  . Postural dizziness 04/05/2013  . Complete heart block ([place]) 02/26/2013  . S/P placement of cardiac pacemaker 02/26/2013  . Bilateral breast cancer ([place]) 03/25/2011    Past Surgical History:  Procedure Laterality Date  . ABDOMINAL HYSTERECTOMY    . BREAST LUMPECTOMY Bilateral 2009  . CATARACT EXTRACTION     EYE SURGERY X 2  . CHOLECYSTECTOMY    . COLONOSCOPY N/A 02/16/2015   Procedure: COLONOSCOPY;  Surgeon: Wilford Corner, MD;  Location: [facility] ENDOSCOPY;  Service: Endoscopy;  Laterality: N/A;  . EP IMPLANTABLE DEVICE N/A 02/23/2015   pacemaker generator change (MDT Sensia SR) by Dr Rayann Heman   . ESOPHAGOGASTRODUODENOSCOPY N/A 02/16/2015   Procedure: ESOPHAGOGASTRODUODENOSCOPY (EGD);  Surgeon: Wilford Corner, MD;  Location: [facility] ENDOSCOPY;  Service: Endoscopy;  Laterality: N/A;  . PACEMAKER INSERTION     MDT implanted by Dr Blanch Media.  Atrial lead placement was unsuccessful.  She has chronic AV dysociation  . remote right radical nephrectomy  2009    OB History    No data available       Home Medications    Prior to Admission medications   Medication Sig Start Date End Date Taking? Authorizing Provider  Calcium Carbonate-Vitamin D (CALTRATE 600+D PO) Take 600 mg by mouth daily after breakfast.    [provider]  furosemide (LASIX) 20 MG tablet Take 20 mg by mouth daily with breakfast.     [provider]  letrozole (FEMARA) 2.5 MG tablet Take 1 tablet (2.5 mg total) by mouth daily. Patient taking differently: Take 2.5 mg by mouth daily with breakfast.  09/19/16   [name], MD  levothyroxine (SYNTHROID, LEVOTHROID) 50 MCG tablet Take 50 mcg by mouth daily before breakfast.     [provider]  lipase/protease/amylase (CREON) 36000 UNITS CPEP capsule Take 36,000 Units by mouth 3  (three) times daily before meals.    [provider]  midodrine (PROAMATINE) 2.5 MG tablet Take 2.5 mg by mouth daily after breakfast.     [provider]  Probiotic Product (PROBIOTIC PO) Take 1 tablet by mouth daily with breakfast.    [provider]    Family History Family History  Problem Relation Age of Onset  . Heart disease Maternal Grandmother   . Heart disease Mother     Social History Social History   Tobacco Use  . Smoking status: Never Smoker  . Smokeless tobacco: Never Used  Substance Use Topics  . Alcohol use: No    Alcohol/week: 0.0 oz  . Drug use: No     Allergies   Erythromycin; Tape; Codeine; Flagyl [metronidazole]; Macrobid [nitrofurantoin macrocrystal]; Tolterodine; and Ciprofloxacin   Review of Systems Review of Systems  Constitutional: Negative for chills, diaphoresis, fatigue and fever.  HENT: Negative for congestion and nosebleeds.   Respiratory: Negative for cough, chest tightness and shortness of breath.   Gastrointestinal: Positive for anal bleeding and hematochezia. Negative for abdominal pain, diarrhea, nausea and vomiting.  Genitourinary: Negative for dysuria.  Musculoskeletal: Negative for back pain, neck pain and neck stiffness.  Neurological: Negative for dizziness, loss of consciousness, light-headedness and headaches.  Psychiatric/Behavioral: Negative for agitation and confusion.  All other systems reviewed and are negative.    Physical Exam Updated Vital Signs BP 134/60   Pulse 67   Temp 98.2 F (36.8 C) (Oral)   Resp 15   Ht 5\' 7"  (1.702 m)   Wt 86.2 kg (190 lb)   SpO2 99%   BMI 29.76 kg/m   Physical Exam  Constitutional: She is oriented to person, place, and time. She appears well-developed and well-nourished. No distress.  HENT:  Head: Normocephalic.  Nose: Nose normal.  Mouth/Throat: Oropharynx is clear and moist. No oropharyngeal exudate.  Eyes: Conjunctivae are normal. Pupils are equal,  round, and reactive to light.  Neck: Normal range of motion.  Cardiovascular: Normal rate and intact distal pulses.  No murmur heard. Pulmonary/Chest: No respiratory distress. She has no wheezes. She exhibits no tenderness.  Abdominal: There is no tenderness.  Neurological: She is alert and oriented to person, place, and time. No cranial nerve deficit or sensory deficit. She exhibits normal muscle tone.  Skin: Capillary refill takes less than 2 seconds. No rash noted. She is not diaphoretic. No erythema.  Nursing note and vitals reviewed.    ED Treatments / Results  Labs (all labs ordered are listed, but only abnormal results are displayed) Labs Reviewed  COMPREHENSIVE METABOLIC PANEL - Abnormal; Notable for the following components:      Result Value   BUN 24 (*)    Creatinine, Ser 1.13 (*)    Calcium 8.8 (*)    Total Protein 5.8 (*)    Albumin 3.4 (*)    ALT 12 (*)    Total Bilirubin 0.2 (*)    GFR calc non Af  Amer 42 (*)    GFR calc Af Amer 49 (*)    All other components within normal limits  CBC - Abnormal; Notable for the following components:   RBC 3.24 (*)    Hemoglobin 10.1 (*)    HCT 31.1 (*)    All other components within normal limits  CBC - Abnormal; Notable for the following components:   RBC 3.47 (*)    Hemoglobin 10.8 (*)    HCT 33.6 (*)    All other components within normal limits  CBC - Abnormal; Notable for the following components:   RBC 3.08 (*)    Hemoglobin 9.6 (*)    HCT 29.8 (*)    All other components within normal limits  PROTIME-INR  BASIC METABOLIC PANEL  CBC  CBC  TYPE AND SCREEN    EKG  EKG Interpretation None       Radiology No results found.  Procedures Procedures (including critical care time)  Medications Ordered in ED Medications  letrozole [facility]) tablet 2.5 mg (not administered)  levothyroxine (SYNTHROID, LEVOTHROID) tablet 50 mcg (not administered)  lipase/protease/amylase (CREON) capsule 36,000 Units (not  administered)  midodrine (PROAMATINE) tablet 2.5 mg (not administered)  acetaminophen (TYLENOL) tablet 650 mg (not administered)    Or  acetaminophen (TYLENOL) suppository 650 mg (not administered)  ondansetron (ZOFRAN) tablet 4 mg (not administered)    Or  ondansetron (ZOFRAN) injection 4 mg (not administered)  0.9 %  sodium chloride infusion (not administered)  sodium chloride 0.9 % bolus 1,000 mL (1,000 mLs Intravenous New Bag/Given 03/30/17 1958)     Initial Impression / Assessment and Plan / ED Course  I have reviewed the triage vital signs and the nursing notes.  Pertinent labs & imaging results that were available during my care of the patient were reviewed by me and considered in my medical decision making (see chart for details).     [name] is a 83 y.o. female with a past medical history significant for breast cancer, complete heart block status pacemaker, and recent GI bleed who presents with rectal bleeding.  Patient reports that she was discharged yesterday for rectal bleed that was felt to be likely diverticular in nature.  She has a history of diverticulosis.  At time of discharge, patient had no further rectal bleeding and her hemoglobin was stable at 11.7.  She says that last night after going home, she had return of rectal bleeding.  She says that she has had numerous episodes of both bright red and clots coming from her rectum.  She has no other symptoms but was concerned and return to the emergency department.  Initial hemoglobin on return was 10.1, decreased from prior to discharge yesterday.  Patient denies any chest pain, palpitations, shortness of breath, lightheadedness, syncope, nausea, vomiting, or abdominal pain.  She denies any respiratory or urinary symptoms.    On exam, abdomen is nontender.  Lungs are clear.  Chest is nontender.  No significant lower extremity tenderness or swelling.  Rectal exam will be performed.   Patient will have repeat  hemoglobin to determine if patient is continued to drop.  Anticipate speaking with gastroenterology and patient will likely require admission due to continued rectal bleeding with drop in hemoglobin.  Patient was noted to have a blood pressure of 90/48 at one point put on recheck it was greater than 297 systolic.  Anticipate reassessment after lab testing.      Patient's repeat hemoglobin was slightly improved however a further  repeat had decreased to 9.6.  Gastroenterology was called who recommended patient be admitted to hospital service and they will see the patient in the morning to likely perform a scope.    Hospitalist team called and patient was admitted for further management.   Final Clinical Impressions(s) / ED Diagnoses   Final diagnoses:  Rectal bleeding     Clinical Impression: 1. Rectal bleeding     Disposition: Admit to hospitalist service    Jermario Kalmar, Gwenyth Allegra, MD 03/31/17 [id number]

## 2017-03-30 NOTE — ED Triage Notes (Signed)
Pt reports She was DC on 03-29-17 with same. Pt reports she had rectal bleeding when she was home last night. Pt reported she could feel the blood run down her leg. Pt reports she saw bright red blood last night and this morning.

## 2017-03-30 NOTE — H&P (Signed)
History and Physical    [name] MHD:[id number] DOB: 21-Apr-1928 DOA: 03/30/2017  PCP: [name], MD  Patient coming from: Home.   Chief Complaint: Rectal bleeding.  HPI: [name] is a 83 y.o. female with history of complete heart block status post pacemaker placement, hypothyroidism, breast cancer in remission, hypothyroidism and chronic hypotension who was just discharged yesterday after being observed for rectal bleeding which was thought to be possibly secondary to diverticular bleed presents to the ER after patient had 3 more episodes of bleeding.  Patient states after going home patient had supper following which patient had a bowel movement which was bloody.  Then later this morning when patient had breakfast again patient had another bloody bowel movement which was followed by another bloody bowel mount after lunch.  Patient came to the ER.  Denies any abdominal pain nausea denies any chest pain or shortness of breath or dizziness.  ED Course: In the ER patient's hemoglobin is around 10.  Patient abdomen appeared benign.  No further episodes of bleeding in the ER.  ER physician discussed with Dr. Bo Mcclintock on-call gastroenterologist, Sadie Haber who advised to admit and will be seeing patient in consult.  Review of Systems: As per HPI, rest all negative.   Past Medical History:  Diagnosis Date  . Cataract   . Complete heart block (HCC)    s/p PPM implant (MDT) by Dr Blanch Media.  Atrial lead could not be paced at time of the procedure.  She has chronic AV dysociation  . Delusional disorder ([place])   . Exogenous obesity   . Hypercholesterolemia   . Invasive ductal carcinoma of breast ([place]) 03/2007   BILATERAL BREASTS  . Orthostatic hypotension    treated with midodrine by Dr Rollene [name]  . PONV (postoperative nausea and vomiting)   . Thyroid disease   . Venous insufficiency     Past Surgical History:  Procedure Laterality Date  . ABDOMINAL HYSTERECTOMY    . BREAST  LUMPECTOMY Bilateral 2009  . CATARACT EXTRACTION     EYE SURGERY X 2  . CHOLECYSTECTOMY    . COLONOSCOPY N/A 02/16/2015   Procedure: COLONOSCOPY;  Surgeon: Wilford Corner, MD;  Location: [facility] ENDOSCOPY;  Service: Endoscopy;  Laterality: N/A;  . EP IMPLANTABLE DEVICE N/A 02/23/2015   pacemaker generator change (MDT Sensia SR) by Dr Rayann Heman   . ESOPHAGOGASTRODUODENOSCOPY N/A 02/16/2015   Procedure: ESOPHAGOGASTRODUODENOSCOPY (EGD);  Surgeon: Wilford Corner, MD;  Location: [facility] ENDOSCOPY;  Service: Endoscopy;  Laterality: N/A;  . PACEMAKER INSERTION     MDT implanted by Dr Blanch Media.  Atrial lead placement was unsuccessful.  She has chronic AV dysociation  . remote right radical nephrectomy  2009     reports that  has never smoked. she has never used smokeless tobacco. She reports that she does not drink alcohol or use drugs.  Allergies  Allergen Reactions  . Erythromycin Hives  . Tape Other (See Comments)    BAND AIDS-SKIN IRRITATION  . Codeine Nausea And Vomiting  . Flagyl [Metronidazole] Hives  . Macrobid [Nitrofurantoin Macrocrystal] Nausea And Vomiting  . Tolterodine Nausea And Vomiting  . Ciprofloxacin Hives and Rash    Family History  Problem Relation Age of Onset  . Heart disease Maternal Grandmother   . Heart disease Mother     Prior to Admission medications   Medication Sig Start Date End Date Taking? Authorizing Provider  Calcium Carbonate-Vitamin D (CALTRATE 600+D PO) Take 600 mg by mouth daily after breakfast.  Yes [provider]  furosemide (LASIX) 20 MG tablet Take 20 mg by mouth daily with breakfast.    Yes [provider]  letrozole (FEMARA) 2.5 MG tablet Take 1 tablet (2.5 mg total) by mouth daily. Patient taking differently: Take 2.5 mg by mouth daily with breakfast.  09/19/16  Yes [name], MD  levothyroxine (SYNTHROID, LEVOTHROID) 50 MCG tablet Take 50 mcg by mouth daily before breakfast.    Yes [provider]    lipase/protease/amylase (CREON) 36000 UNITS CPEP capsule Take 36,000 Units by mouth 3 (three) times daily before meals.   Yes [provider]  midodrine (PROAMATINE) 2.5 MG tablet Take 2.5 mg by mouth daily after breakfast.    Yes [provider]  Probiotic Product (PROBIOTIC PO) Take 1 tablet by mouth daily with breakfast.    [provider]    Physical Exam: Vitals:   03/30/17 1830 03/30/17 1845 03/30/17 1930 03/30/17 2015  BP:  (!) 91/53 103/66 105/74  Pulse: 68 61 66 (!) 59  Resp: 16 18 16 17   Temp:      TempSrc:      SpO2: 100% 98% 100% 97%  Weight:      Height:          Constitutional: Moderately built and nourished. Vitals:   03/30/17 1830 03/30/17 1845 03/30/17 1930 03/30/17 2015  BP:  (!) 91/53 103/66 105/74  Pulse: 68 61 66 (!) 59  Resp: 16 18 16 17   Temp:      TempSrc:      SpO2: 100% 98% 100% 97%  Weight:      Height:       Eyes: Anicteric no pallor. ENMT: No discharge from the ears eyes nose or mouth. Neck: No mass felt.  No neck rigidity. Respiratory: No rhonchi or crepitations. Cardiovascular: S1-S2 heard no murmurs appreciated. Abdomen: Soft nontender bowel sounds present. Musculoskeletal: No edema.  No joint effusion. Skin: No rash. Neurologic: Alert awake oriented to time place and person.  Moves all extremities.  Psychiatric: Appears normal.   Labs on Admission: I have personally reviewed following labs and imaging studies  CBC: Recent Labs  Lab 03/27/17 2330 03/28/17 0841 03/29/17 0416 03/30/17 1458 03/30/17 1739  WBC 8.1 6.9 7.5 6.8 8.4  HGB 11.8* 12.3 11.7* 10.1* 10.8*  HCT 37.0 39.0 36.4 31.1* 33.6*  MCV 96.6 96.8 96.6 96.0 96.8  PLT 250 259 251 250 371   Basic Metabolic Panel: Recent Labs  Lab 03/26/17 1821 03/28/17 0455 03/28/17 1458 03/30/17 1458  NA 140 141 137 139  K 3.8 3.4* 4.5 3.9  CL 106 112* 108 110  CO2 23 26 21* 22  GLUCOSE 93 89 177* 94  BUN 22* 17 18 24*  CREATININE 1.08* 1.08*  1.11* 1.13*  CALCIUM 8.7* 8.3* 8.3* 8.8*  MG  --  1.9  --   --    GFR: Estimated Creatinine Clearance: 39.5 mL/min (A) (by C-G formula based on SCr of 1.13 mg/dL (H)). Liver Function Tests: Recent Labs  Lab 03/26/17 1821 03/28/17 1458 03/30/17 1458  AST 18 27 18   ALT 13* 10* 12*  ALKPHOS 100 84 84  BILITOT 0.5 1.1 0.2*  PROT 6.3* 5.8* 5.8*  ALBUMIN 3.5 3.3* 3.4*   Recent Labs  Lab 03/26/17 1821  LIPASE 28   No results for input(s): AMMONIA in the last 168 hours. Coagulation Profile: Recent Labs  Lab 03/27/17 0213 03/30/17 1739  INR 0.98 0.96   Cardiac Enzymes: No results for  input(s): CKTOTAL, CKMB, CKMBINDEX, TROPONINI in the last 168 hours. BNP (last 3 results) No results for input(s): PROBNP in the last 8760 hours. HbA1C: No results for input(s): HGBA1C in the last 72 hours. CBG: No results for input(s): GLUCAP in the last 168 hours. Lipid Profile: No results for input(s): CHOL, HDL, LDLCALC, TRIG, CHOLHDL, LDLDIRECT in the last 72 hours. Thyroid Function Tests: No results for input(s): TSH, T4TOTAL, FREET4, T3FREE, THYROIDAB in the last 72 hours. Anemia Panel: No results for input(s): VITAMINB12, FOLATE, FERRITIN, TIBC, IRON, RETICCTPCT in the last 72 hours. Urine analysis:    Component Value Date/Time   COLORURINE YELLOW 03/26/2017 1819   APPEARANCEUR CLEAR 03/26/2017 1819   LABSPEC >1.046 (H) 03/26/2017 1819   PHURINE 6.0 03/26/2017 1819   GLUCOSEU NEGATIVE 03/26/2017 1819   HGBUR LARGE (A) 03/26/2017 1819   BILIRUBINUR NEGATIVE 03/26/2017 1819   KETONESUR NEGATIVE 03/26/2017 1819   PROTEINUR NEGATIVE 03/26/2017 1819   UROBILINOGEN 0.2 04/12/2014 1928   NITRITE NEGATIVE 03/26/2017 1819   LEUKOCYTESUR NEGATIVE 03/26/2017 1819   Sepsis Labs: @LABRCNTIP (procalcitonin:4,lacticidven:4) )No results found for this or any previous visit (from the past 240 hour(s)).   Radiological Exams on Admission: No results found.    Assessment/Plan Principal  Problem:   Rectal bleeding Active Problems:   S/P placement of cardiac pacemaker   Hypothyroidism   CHB (complete heart block) ([place])    1. Rectal bleeding -as per the gastroenterologist during last admission a few days ago, patient had colonoscopy in October 2016 which showed diverticulosis and has had similar patient in 2016.  At this time we suspect patient is having diverticular bleed.  Will keep patient on clear liquids and closely follow CBC.  Gastroenterologist to see patient in the morning. 2. Acute blood loss anemia -follow CBC. 3. Chronic hypotension on midodrine. 4. History of complete heart block status post pacemaker placement. 5. Hypothyroidism on Synthroid. 6. History of breast cancer in remission.  On femora.   DVT prophylaxis: SCDs. Code Status: No intubation. Family Communication: Discussed with patient. Disposition Plan: Home. Consults called: Gastroenterology. Admission status: Observation.   Rise Patience MD [facility] Pager [phone].  If 7PM-7AM, please contact night-coverage [email or web address] Password [facility]  03/30/2017, 8:48 PM

## 2017-03-30 NOTE — ED Notes (Addendum)
Delay in lab draw,  Pt using bedpan 

## 2017-03-30 NOTE — ED Notes (Signed)
Nurse drawing labs. 

## 2017-03-30 NOTE — ED Notes (Signed)
When Pt was using bedpan (21:34) it was observed that she has a possible yeast infection along her hips, as well as bright red irritation between her butt cheeks. When asked, she did not complain of any itching or pain in those areas.

## 2017-03-31 ENCOUNTER — Encounter (HOSPITAL_COMMUNITY): Payer: Self-pay

## 2017-03-31 DIAGNOSIS — E039 Hypothyroidism, unspecified: Secondary | ICD-10-CM | POA: Diagnosis not present

## 2017-03-31 DIAGNOSIS — Z950 Presence of cardiac pacemaker: Secondary | ICD-10-CM | POA: Diagnosis not present

## 2017-03-31 DIAGNOSIS — Z6829 Body mass index (BMI) 29.0-29.9, adult: Secondary | ICD-10-CM | POA: Diagnosis not present

## 2017-03-31 DIAGNOSIS — K5731 Diverticulosis of large intestine without perforation or abscess with bleeding: Secondary | ICD-10-CM | POA: Diagnosis present

## 2017-03-31 DIAGNOSIS — K922 Gastrointestinal hemorrhage, unspecified: Secondary | ICD-10-CM | POA: Diagnosis not present

## 2017-03-31 DIAGNOSIS — I442 Atrioventricular block, complete: Secondary | ICD-10-CM | POA: Diagnosis present

## 2017-03-31 DIAGNOSIS — I872 Venous insufficiency (chronic) (peripheral): Secondary | ICD-10-CM | POA: Diagnosis present

## 2017-03-31 DIAGNOSIS — Z885 Allergy status to narcotic agent status: Secondary | ICD-10-CM | POA: Diagnosis not present

## 2017-03-31 DIAGNOSIS — D62 Acute posthemorrhagic anemia: Secondary | ICD-10-CM | POA: Diagnosis present

## 2017-03-31 DIAGNOSIS — E7800 Pure hypercholesterolemia, unspecified: Secondary | ICD-10-CM | POA: Diagnosis present

## 2017-03-31 DIAGNOSIS — Z853 Personal history of malignant neoplasm of breast: Secondary | ICD-10-CM | POA: Diagnosis not present

## 2017-03-31 DIAGNOSIS — Z888 Allergy status to other drugs, medicaments and biological substances status: Secondary | ICD-10-CM | POA: Diagnosis not present

## 2017-03-31 DIAGNOSIS — E6609 Other obesity due to excess calories: Secondary | ICD-10-CM | POA: Diagnosis present

## 2017-03-31 DIAGNOSIS — K625 Hemorrhage of anus and rectum: Secondary | ICD-10-CM

## 2017-03-31 DIAGNOSIS — Z79899 Other long term (current) drug therapy: Secondary | ICD-10-CM | POA: Diagnosis not present

## 2017-03-31 DIAGNOSIS — Z881 Allergy status to other antibiotic agents status: Secondary | ICD-10-CM | POA: Diagnosis not present

## 2017-03-31 DIAGNOSIS — I951 Orthostatic hypotension: Secondary | ICD-10-CM | POA: Diagnosis present

## 2017-03-31 DIAGNOSIS — K5791 Diverticulosis of intestine, part unspecified, without perforation or abscess with bleeding: Secondary | ICD-10-CM | POA: Diagnosis not present

## 2017-03-31 DIAGNOSIS — Z905 Acquired absence of kidney: Secondary | ICD-10-CM | POA: Diagnosis not present

## 2017-03-31 DIAGNOSIS — K5730 Diverticulosis of large intestine without perforation or abscess without bleeding: Secondary | ICD-10-CM | POA: Diagnosis not present

## 2017-03-31 LAB — CBC
HEMATOCRIT: 28.4 % — AB (ref 36.0–46.0)
HEMATOCRIT: 29 % — AB (ref 36.0–46.0)
HEMOGLOBIN: 9.3 g/dL — AB (ref 12.0–15.0)
HEMOGLOBIN: 9.4 g/dL — AB (ref 12.0–15.0)
MCH: 31 pg (ref 26.0–34.0)
MCH: 31.1 pg (ref 26.0–34.0)
MCHC: 32.4 g/dL (ref 30.0–36.0)
MCHC: 32.7 g/dL (ref 30.0–36.0)
MCV: 95 fL (ref 78.0–100.0)
MCV: 95.7 fL (ref 78.0–100.0)
PLATELETS: 249 10*3/uL (ref 150–400)
Platelets: 243 10*3/uL (ref 150–400)
RBC: 3.03 MIL/uL — ABNORMAL LOW (ref 3.87–5.11)
RBC: 2.99 MIL/uL — AB (ref 3.87–5.11)
RDW: 13.7 % (ref 11.5–15.5)
RDW: 13.8 % (ref 11.5–15.5)
WBC: 5.9 10*3/uL (ref 4.0–10.5)
WBC: 6.4 10*3/uL (ref 4.0–10.5)

## 2017-03-31 LAB — BASIC METABOLIC PANEL
Anion gap: 5 (ref 5–15)
BUN: 19 mg/dL (ref 6–20)
CALCIUM: 8.2 mg/dL — AB (ref 8.9–10.3)
CHLORIDE: 113 mmol/L — AB (ref 101–111)
CO2: 24 mmol/L (ref 22–32)
CREATININE: 1.05 mg/dL — AB (ref 0.44–1.00)
GFR calc non Af Amer: 46 mL/min — ABNORMAL LOW (ref >60)
GFR, EST AFRICAN AMERICAN: 54 mL/min — AB (ref >60)
Glucose, Bld: 85 mg/dL (ref 65–99)
Potassium: 3.5 mmol/L (ref 3.5–5.1)
SODIUM: 142 mmol/L (ref 135–145)

## 2017-03-31 LAB — CBG MONITORING, ED: Glucose-Capillary: 125 mg/dL — ABNORMAL HIGH (ref 65–99)

## 2017-03-31 NOTE — Consult Note (Signed)
Referring Provider: Dr. Eliseo Squires Primary Care Physician:  [name], MD Primary Gastroenterologist:  Dr. [name]  Reason for Consultation:  Rectal bleeding  HPI: [name] is a 83 y.o. female who has a history of diverticular bleeding in 2016 and was admitted last week for rectal bleeding that was presumed to be diverticular bleeding. She was treated conservatively and was discharged on 03/29/17 (admitted on 03/27/17) with a stable Hgb 11.7. She reports that after eating dinner yesterday she had a recurrence of red blood per rectum without stool X 3. Denies any associated abdominal pain, N/V, dizziness. Last colonoscopy in 2016 following a GI bleed that showed left-sided diverticulosis without active bleeding. A hyperplastic polyp was removed as well. Hgb at discharge on 12/1 of 11.7 and on admit yesterday 10.1. Hgb 9.4 today. No bleeding since admit.  Past Medical History:  Diagnosis Date  . Cataract   . Complete heart block (HCC)    s/p PPM implant (MDT) by Dr Blanch Media.  Atrial lead could not be paced at time of the procedure.  She has chronic AV dysociation  . Delusional disorder ([place])   . Exogenous obesity   . Hypercholesterolemia   . Invasive ductal carcinoma of breast ([place]) 03/2007   BILATERAL BREASTS  . Orthostatic hypotension    treated with midodrine by Dr Rollene [name]  . PONV (postoperative nausea and vomiting)   . Thyroid disease   . Venous insufficiency     Past Surgical History:  Procedure Laterality Date  . ABDOMINAL HYSTERECTOMY    . BREAST LUMPECTOMY Bilateral 2009  . CATARACT EXTRACTION     EYE SURGERY X 2  . CHOLECYSTECTOMY    . COLONOSCOPY N/A 02/16/2015   Procedure: COLONOSCOPY;  Surgeon: Wilford Corner, MD;  Location: [facility] ENDOSCOPY;  Service: Endoscopy;  Laterality: N/A;  . EP IMPLANTABLE DEVICE N/A 02/23/2015   pacemaker generator change (MDT Sensia SR) by Dr Rayann Heman   . ESOPHAGOGASTRODUODENOSCOPY N/A 02/16/2015   Procedure: ESOPHAGOGASTRODUODENOSCOPY (EGD);   Surgeon: Wilford Corner, MD;  Location: [facility] ENDOSCOPY;  Service: Endoscopy;  Laterality: N/A;  . PACEMAKER INSERTION     MDT implanted by Dr Blanch Media.  Atrial lead placement was unsuccessful.  She has chronic AV dysociation  . remote right radical nephrectomy  2009    Prior to Admission medications   Medication Sig Start Date End Date Taking? Authorizing Provider  Calcium Carbonate-Vitamin D (CALTRATE 600+D PO) Take 600 mg by mouth daily after breakfast.   Yes [provider]  furosemide (LASIX) 20 MG tablet Take 20 mg by mouth daily with breakfast.    Yes [provider]  letrozole (FEMARA) 2.5 MG tablet Take 1 tablet (2.5 mg total) by mouth daily. Patient taking differently: Take 2.5 mg by mouth daily with breakfast.  09/19/16  Yes [name], MD  levothyroxine (SYNTHROID, LEVOTHROID) 50 MCG tablet Take 50 mcg by mouth daily before breakfast.    Yes [provider]  lipase/protease/amylase (CREON) 36000 UNITS CPEP capsule Take 36,000 Units by mouth 3 (three) times daily before meals.   Yes [provider]  midodrine (PROAMATINE) 2.5 MG tablet Take 2.5 mg by mouth daily after breakfast.    Yes [provider]  Probiotic Product (PROBIOTIC PO) Take 1 tablet by mouth daily with breakfast.    [provider]    Scheduled Meds: . letrozole  2.5 mg Oral Q breakfast  . levothyroxine  50 mcg Oral QAC breakfast  . lipase/protease/amylase  36,000 Units Oral TID AC  . midodrine  2.5 mg Oral QPC breakfast   Continuous Infusions: . sodium chloride 10 mL/hr at 03/31/17 0813   PRN Meds:.acetaminophen **OR** acetaminophen, ondansetron **OR** ondansetron (ZOFRAN) IV  Allergies as of 03/30/2017 - Review Complete 03/30/2017  Allergen Reaction Noted  . Erythromycin Hives 02/01/2011  . Tape Other (See Comments) 02/01/2011  . Codeine Nausea And Vomiting 11/07/2015  . Flagyl [metronidazole] Hives 10/14/2016  . Macrobid [nitrofurantoin  macrocrystal] Nausea And Vomiting 05/05/2016  . Tolterodine Nausea And Vomiting 11/07/2015  . Ciprofloxacin Hives and Rash 10/14/2016    Family History  Problem Relation Age of Onset  . Heart disease Maternal Grandmother   . Heart disease Mother     Social History   Socioeconomic History  . Marital status: Widowed    Spouse name: Not on file  . Number of children: Not on file  . Years of education: Not on file  . Highest education level: Not on file  Social Needs  . Financial resource strain: Not on file  . Food insecurity - worry: Not on file  . Food insecurity - inability: Not on file  . Transportation needs - medical: Not on file  . Transportation needs - non-medical: Not on file  Occupational History  . Not on file  Tobacco Use  . Smoking status: Never Smoker  . Smokeless tobacco: Never Used  Substance and Sexual Activity  . Alcohol use: No    Alcohol/week: 0.0 oz  . Drug use: No  . Sexual activity: Not Currently  Other Topics Concern  . Not on file  Social History Narrative  . Not on file    Review of Systems: All negative except as stated above in HPI.  Physical Exam: Vital signs: Vitals:   03/31/17 0809 03/31/17 0830  BP: 103/73 116/62  Pulse: 93 62  Resp: 16 18  Temp:    SpO2: 94% 100%  T 98.2   General:   Alert,  Well-developed, well-nourished, pleasant and cooperative in NAD, elderly Head: normocephalic, atraumatic Eyes: anicteric sclera ENT: oropharynx clear Neck: supple, nontender Lungs:  Clear throughout to auscultation.   No wheezes, crackles, or rhonchi. No acute distress. Heart:  Regular rate and rhythm; no murmurs, clicks, rubs,  or gallops. Abdomen: soft, nontender, nondistended, +BS  Rectal:  Deferred Ext: no edema  GI:  Lab Results: Recent Labs    03/30/17 2145 03/31/17 0031 03/31/17 0335  WBC 7.1 6.4 5.9  HGB 9.6* 9.3* 9.4*  HCT 29.8* 28.4* 29.0*  PLT 232 249 243   BMET Recent Labs    03/28/17 1458 03/30/17 1458  03/31/17 0335  NA 137 139 142  K 4.5 3.9 3.5  CL 108 110 113*  CO2 21* 22 24  GLUCOSE 177* 94 85  BUN 18 24* 19  CREATININE 1.11* 1.13* 1.05*  CALCIUM 8.3* 8.8* 8.2*   LFT Recent Labs    03/30/17 1458  PROT 5.8*  ALBUMIN 3.4*  AST 18  ALT 12*  ALKPHOS 84  BILITOT 0.2*   PT/INR Recent Labs    03/30/17 1739  LABPROT 12.7  INR 0.96     Studies/Results: No results found.  Impression/Plan: Painless hematochezia that is likely diverticular. Due to advanced age and comorbidities would manage conservatively despite this being a recurrent admission. If the bleeding remains resolved today and tomorrow, then would advance diet and hold off on a repeat colonoscopy. If bleeding returns, then consider a bleeding scan and colonoscopy if lower source seen on scan. Clear liquid diet today. Supportive care. Will  follow.    LOS: 0 days   [place] C.  03/31/2017, 9:00 AM  Pager [phone]  AFTER 5 pm or on weekends please call [phone]

## 2017-03-31 NOTE — ED Notes (Signed)
Dr. Vann at bedside 

## 2017-03-31 NOTE — ED Notes (Signed)
Pt eating dinner at this time

## 2017-03-31 NOTE — ED Notes (Signed)
Patient offered purewick catheter for urination, pt stated she would rather get up to bedside commode for now.

## 2017-03-31 NOTE — ED Notes (Addendum)
Pt oob to br with steady gait. Pt has small amount of loose stool that is red/brown in color.Pt denies pain or weakness but states she feels a little unsteady.

## 2017-03-31 NOTE — ED Notes (Signed)
Breakfast at bedside.

## 2017-03-31 NOTE — ED Notes (Signed)
Pt ambulated to bathroom with 1 assist, tolerated well.

## 2017-03-31 NOTE — Progress Notes (Signed)
PROGRESS NOTE    [name]  XIP:[id number] DOB: 10-Oct-1927 DOA: 03/30/2017 PCP: [name], MD   Outpatient Specialists:    Brief Narrative:  [name] is a 83 y.o. female with history of complete heart block status post pacemaker placement, hypothyroidism, breast cancer in remission, hypothyroidism and chronic hypotension who was just discharged yesterday after being observed for rectal bleeding which was thought to be possibly secondary to diverticular bleed presents to the ER after patient had 3 more episodes of bleeding.  Patient states after going home patient had supper following which patient had a bowel movement which was bloody.  Then later this morning when patient had breakfast again patient had another bloody bowel movement which was followed by another bloody bowel mount after lunch.  Patient came to the ER.  Denies any abdominal pain nausea denies any chest pain or shortness of breath or dizziness.      Assessment & Plan:   Principal Problem:   Rectal bleeding Active Problems:   S/P placement of cardiac pacemaker   Hypothyroidism   CHB (complete heart block) (HCC)   Rectal bleeding  - colonoscopy in October 2016 which showed diverticulosis  -suspect patient is having diverticular bleed -clear liquids  -Gastroenterologist to see patient in the morning.  Acute blood loss anemia  -H/H  Chronic hypotension -midodrine  History of complete heart block status post pacemaker placement.  Hypothyroidism  -Synthroid.  History of breast cancer in remission - femora.      DVT prophylaxis:  SCD's  Code Status: Partial Code  Family Communication:   Disposition Plan:  Home 1-2 days if no further bleeding   Consultants:  GI   Subjective: No further bleeding  Objective: Vitals:   03/31/17 0700 03/31/17 0730 03/31/17 0800 03/31/17 0809  BP: (!) 99/58 100/67 (!) 105/59 103/73  Pulse: (!) 59 (!) 59 61 93  Resp: 16 15 14 16   Temp:       TempSrc:      SpO2: 99% 98% 100% 94%  Weight:      Height:        Intake/Output Summary (Last 24 hours) at 03/31/2017 6734 Last data filed at 03/31/2017 0000 Gross per 24 hour  Intake 1000 ml  Output -  Net 1000 ml   Filed Weights   03/30/17 1418  Weight: 86.2 kg (190 lb)    Examination:  General exam: Appears calm and comfortable  Respiratory system: Clear to auscultation. Respiratory effort normal. Cardiovascular system: S1 & S2 heard, RRR. No JVD, murmurs, rubs, gallops or clicks. No pedal edema. Gastrointestinal system: Abdomen is nondistended, soft and nontender. No organomegaly or masses felt. Normal bowel sounds heard. Central nervous system: Alert and oriented. No focal neurological deficits. Extremities: Symmetric 5 x 5 power. Skin: No rashes, lesions or ulcers Psychiatry: Judgement and insight appear normal. Mood & affect appropriate.     Data Reviewed: I have personally reviewed following labs and imaging studies  CBC: Recent Labs  Lab 03/30/17 1458 03/30/17 1739 03/30/17 2145 03/31/17 0031 03/31/17 0335  WBC 6.8 8.4 7.1 6.4 5.9  HGB 10.1* 10.8* 9.6* 9.3* 9.4*  HCT 31.1* 33.6* 29.8* 28.4* 29.0*  MCV 96.0 96.8 96.8 95.0 95.7  PLT 250 258 232 249 193   Basic Metabolic Panel: Recent Labs  Lab 03/26/17 1821 03/28/17 0455 03/28/17 1458 03/30/17 1458 03/31/17 0335  NA 140 141 137 139 142  K 3.8 3.4* 4.5 3.9 3.5  CL 106 112* 108 110 113*  CO2  23 26 21* 22 24  GLUCOSE 93 89 177* 94 85  BUN 22* 17 18 24* 19  CREATININE 1.08* 1.08* 1.11* 1.13* 1.05*  CALCIUM 8.7* 8.3* 8.3* 8.8* 8.2*  MG  --  1.9  --   --   --    GFR: Estimated Creatinine Clearance: 42.5 mL/min (A) (by C-G formula based on SCr of 1.05 mg/dL (H)). Liver Function Tests: Recent Labs  Lab 03/26/17 1821 03/28/17 1458 03/30/17 1458  AST 18 27 18   ALT 13* 10* 12*  ALKPHOS 100 84 84  BILITOT 0.5 1.1 0.2*  PROT 6.3* 5.8* 5.8*  ALBUMIN 3.5 3.3* 3.4*   Recent Labs  Lab  03/26/17 1821  LIPASE 28   No results for input(s): AMMONIA in the last 168 hours. Coagulation Profile: Recent Labs  Lab 03/27/17 0213 03/30/17 1739  INR 0.98 0.96   Cardiac Enzymes: No results for input(s): CKTOTAL, CKMB, CKMBINDEX, TROPONINI in the last 168 hours. BNP (last 3 results) No results for input(s): PROBNP in the last 8760 hours. HbA1C: No results for input(s): HGBA1C in the last 72 hours. CBG: No results for input(s): GLUCAP in the last 168 hours. Lipid Profile: No results for input(s): CHOL, HDL, LDLCALC, TRIG, CHOLHDL, LDLDIRECT in the last 72 hours. Thyroid Function Tests: No results for input(s): TSH, T4TOTAL, FREET4, T3FREE, THYROIDAB in the last 72 hours. Anemia Panel: No results for input(s): VITAMINB12, FOLATE, FERRITIN, TIBC, IRON, RETICCTPCT in the last 72 hours. Urine analysis:    Component Value Date/Time   COLORURINE YELLOW 03/26/2017 1819   APPEARANCEUR CLEAR 03/26/2017 1819   LABSPEC >1.046 (H) 03/26/2017 1819   PHURINE 6.0 03/26/2017 1819   GLUCOSEU NEGATIVE 03/26/2017 1819   HGBUR LARGE (A) 03/26/2017 1819   BILIRUBINUR NEGATIVE 03/26/2017 1819   KETONESUR NEGATIVE 03/26/2017 1819   PROTEINUR NEGATIVE 03/26/2017 1819   UROBILINOGEN 0.2 04/12/2014 1928   NITRITE NEGATIVE 03/26/2017 1819   LEUKOCYTESUR NEGATIVE 03/26/2017 1819     )No results found for this or any previous visit (from the past 240 hour(s)).    Anti-infectives (From admission, onward)   None       Radiology Studies: No results found.      Scheduled Meds: . letrozole  2.5 mg Oral Q breakfast  . levothyroxine  50 mcg Oral QAC breakfast  . lipase/protease/amylase  36,000 Units Oral TID AC  . midodrine  2.5 mg Oral QPC breakfast   Continuous Infusions: . sodium chloride 10 mL/hr at 03/31/17 0813     LOS: 0 days    Time spent: 35 min   Geradine Girt, DO [facility] Pager [phone]  If 7PM-7AM, please contact  night-coverage [email or web address] Password [facility] 03/31/2017, 8:32 AM

## 2017-03-31 NOTE — ED Notes (Signed)
Breakfast tray ordered 

## 2017-03-31 NOTE — ED Notes (Signed)
Pt ambulates to BR with steady gati.

## 2017-03-31 NOTE — Progress Notes (Signed)
Patient from [facility], alert and oriented,no complaints of any pain or discomfort. Will endorsed to night Rn.

## 2017-03-31 NOTE — ED Notes (Signed)
Pt has just eaten clear liquid tray.

## 2017-03-31 NOTE — ED Notes (Signed)
Attempted to call report

## 2017-03-31 NOTE — ED Notes (Signed)
MD at bedside. 

## 2017-04-01 ENCOUNTER — Other Ambulatory Visit: Payer: Self-pay

## 2017-04-01 DIAGNOSIS — K5730 Diverticulosis of large intestine without perforation or abscess without bleeding: Secondary | ICD-10-CM

## 2017-04-01 LAB — CBC
HCT: 30.2 % — ABNORMAL LOW (ref 36.0–46.0)
Hemoglobin: 9.5 g/dL — ABNORMAL LOW (ref 12.0–15.0)
MCH: 30.7 pg (ref 26.0–34.0)
MCHC: 31.5 g/dL (ref 30.0–36.0)
MCV: 97.7 fL (ref 78.0–100.0)
Platelets: 255 10*3/uL (ref 150–400)
RBC: 3.09 MIL/uL — ABNORMAL LOW (ref 3.87–5.11)
RDW: 14.3 % (ref 11.5–15.5)
WBC: 6.2 10*3/uL (ref 4.0–10.5)

## 2017-04-01 LAB — GLUCOSE, CAPILLARY
GLUCOSE-CAPILLARY: 83 mg/dL (ref 65–99)
Glucose-Capillary: 90 mg/dL (ref 65–99)

## 2017-04-01 NOTE — Progress Notes (Signed)
Patient reports having eaten her dinner 9 green beans and apples) followed by stool x4 on bsc. On bristol scale stool would be rated between 6 and 7.

## 2017-04-01 NOTE — Progress Notes (Signed)
PROGRESS NOTE    [name]  KCL:[id number] DOB: 1927-07-25 DOA: 03/30/2017 PCP: [name], MD   Outpatient Specialists:    Brief Narrative:  [name] is a 83 y.o. female with history of complete heart block status post pacemaker placement, hypothyroidism, breast cancer in remission, hypothyroidism and chronic hypotension who was just discharged yesterday after being observed for rectal bleeding which was thought to be possibly secondary to diverticular bleed presents to the ER after patient had 3 more episodes of bleeding.  Patient states after going home patient had supper following which patient had a bowel movement which was bloody.  Then later this morning when patient had breakfast again patient had another bloody bowel movement which was followed by another bloody bowel mount after lunch.  Patient came to the ER.  Denies any abdominal pain nausea denies any chest pain or shortness of breath or dizziness.      Assessment & Plan:   Principal Problem:   Rectal bleeding Active Problems:   S/P placement of cardiac pacemaker   Hypothyroidism   CHB (complete heart block) (HCC)   Rectal bleeding  - colonoscopy in October 2016 which showed diverticulosis  -suspect patient is having diverticular bleed that has resolved -clear liquids advanced to soft -if not further bleeding, can be d/c'd in AM  Acute blood loss anemia  -H/H stable  Chronic hypotension -midodrine  History of complete heart block status post pacemaker placement.  Hypothyroidism  -Synthroid.  History of breast cancer in remission - femora.      DVT prophylaxis:  SCD's  Code Status: Partial Code  Family Communication:   Disposition Plan:  Home in AM   Consultants:  GI   Subjective: Tolerating clears, brown bowel movement today  Objective: Vitals:   03/31/17 1843 04/01/17 0015 04/01/17 0628 04/01/17 1203  BP: 132/66 (!) 101/45 109/66 123/66  Pulse: 67 (!) 59 64 68    Resp: 18 18 18    Temp: (!) 97.5 F (36.4 C) (!) 97.3 F (36.3 C) 97.8 F (36.6 C)   TempSrc: Oral Oral Oral   SpO2: 95% 97% 97% 97%  Weight:   86.1 kg (189 lb 14.4 oz)   Height:        Intake/Output Summary (Last 24 hours) at 04/01/2017 1333 Last data filed at 04/01/2017 1011 Gross per 24 hour  Intake 921.5 ml  Output 101 ml  Net 820.5 ml   Filed Weights   03/30/17 1418 03/31/17 1839 04/01/17 0628  Weight: 86.2 kg (190 lb) 87 kg (191 lb 11.2 oz) 86.1 kg (189 lb 14.4 oz)    Examination:  General exam: NAD Respiratory system: clear Cardiovascular system: rrr Gastrointestinal system: +BS, soft Central nervous system: alert Psychiatry: mood normal    Data Reviewed: I have personally reviewed following labs and imaging studies  CBC: Recent Labs  Lab 03/30/17 1739 03/30/17 2145 03/31/17 0031 03/31/17 0335 04/01/17 1037  WBC 8.4 7.1 6.4 5.9 6.2  HGB 10.8* 9.6* 9.3* 9.4* 9.5*  HCT 33.6* 29.8* 28.4* 29.0* 30.2*  MCV 96.8 96.8 95.0 95.7 97.7  PLT 258 232 249 243 494   Basic Metabolic Panel: Recent Labs  Lab 03/26/17 1821 03/28/17 0455 03/28/17 1458 03/30/17 1458 03/31/17 0335  NA 140 141 137 139 142  K 3.8 3.4* 4.5 3.9 3.5  CL 106 112* 108 110 113*  CO2 23 26 21* 22 24  GLUCOSE 93 89 177* 94 85  BUN 22* 17 18 24* 19  CREATININE 1.08* 1.08*  1.11* 1.13* 1.05*  CALCIUM 8.7* 8.3* 8.3* 8.8* 8.2*  MG  --  1.9  --   --   --    GFR: Estimated Creatinine Clearance: 43 mL/min (A) (by C-G formula based on SCr of 1.05 mg/dL (H)). Liver Function Tests: Recent Labs  Lab 03/26/17 1821 03/28/17 1458 03/30/17 1458  AST 18 27 18   ALT 13* 10* 12*  ALKPHOS 100 84 84  BILITOT 0.5 1.1 0.2*  PROT 6.3* 5.8* 5.8*  ALBUMIN 3.5 3.3* 3.4*   Recent Labs  Lab 03/26/17 1821  LIPASE 28   No results for input(s): AMMONIA in the last 168 hours. Coagulation Profile: Recent Labs  Lab 03/27/17 0213 03/30/17 1739  INR 0.98 0.96   Cardiac Enzymes: No results for  input(s): CKTOTAL, CKMB, CKMBINDEX, TROPONINI in the last 168 hours. BNP (last 3 results) No results for input(s): PROBNP in the last 8760 hours. HbA1C: No results for input(s): HGBA1C in the last 72 hours. CBG: Recent Labs  Lab 03/31/17 1804  GLUCAP 125*   Lipid Profile: No results for input(s): CHOL, HDL, LDLCALC, TRIG, CHOLHDL, LDLDIRECT in the last 72 hours. Thyroid Function Tests: No results for input(s): TSH, T4TOTAL, FREET4, T3FREE, THYROIDAB in the last 72 hours. Anemia Panel: No results for input(s): VITAMINB12, FOLATE, FERRITIN, TIBC, IRON, RETICCTPCT in the last 72 hours. Urine analysis:    Component Value Date/Time   COLORURINE YELLOW 03/26/2017 1819   APPEARANCEUR CLEAR 03/26/2017 1819   LABSPEC >1.046 (H) 03/26/2017 1819   PHURINE 6.0 03/26/2017 1819   GLUCOSEU NEGATIVE 03/26/2017 1819   HGBUR LARGE (A) 03/26/2017 1819   BILIRUBINUR NEGATIVE 03/26/2017 1819   KETONESUR NEGATIVE 03/26/2017 1819   PROTEINUR NEGATIVE 03/26/2017 1819   UROBILINOGEN 0.2 04/12/2014 1928   NITRITE NEGATIVE 03/26/2017 1819   LEUKOCYTESUR NEGATIVE 03/26/2017 1819     )No results found for this or any previous visit (from the past 240 hour(s)).    Anti-infectives (From admission, onward)   None       Radiology Studies: No results found.      Scheduled Meds: . letrozole  2.5 mg Oral Q breakfast  . levothyroxine  50 mcg Oral QAC breakfast  . lipase/protease/amylase  36,000 Units Oral TID AC  . midodrine  2.5 mg Oral QPC breakfast   Continuous Infusions: . sodium chloride 10 mL/hr (03/31/17 1650)     LOS: 1 day    Time spent: 35 min   Geradine Girt, DO [facility] Pager [phone]  If 7PM-7AM, please contact night-coverage [email or web address] Password [email or web address] 04/01/2017, 1:33 PM

## 2017-04-01 NOTE — Progress Notes (Signed)
[facility] Gastroenterology Progress Note  [name] 83 y.o. 1928/01/05   Subjective: No rectal bleeding since admit. Brown stool this morning. Denies abdominal pain. Sitting up on side of bed drinking clear liquids and tolerating well.  Objective: Vital signs: Vitals:   04/01/17 0628 04/01/17 1203  BP: 109/66 123/66  Pulse: 64 68  Resp: 18   Temp: 97.8 F (36.6 C)   SpO2: 97% 97%    Physical Exam: Gen: alert, no acute distress, elderly HEENT: anicteric sclera CV: RRR Chest: CTA B Abd: soft, nontender, nondistended, +BS  Lab Results: Recent Labs    03/30/17 1458 03/31/17 0335  NA 139 142  K 3.9 3.5  CL 110 113*  CO2 22 24  GLUCOSE 94 85  BUN 24* 19  CREATININE 1.13* 1.05*  CALCIUM 8.8* 8.2*   Recent Labs    03/30/17 1458  AST 18  ALT 12*  ALKPHOS 84  BILITOT 0.2*  PROT 5.8*  ALBUMIN 3.4*   Recent Labs    03/31/17 0335 04/01/17 1037  WBC 5.9 6.2  HGB 9.4* 9.5*  HCT 29.0* 30.2*  MCV 95.7 97.7  PLT 243 255      Assessment/Plan: GI bleed - likely diverticular that appears to have resolved. Hgb stable. Will change to soft diet and if tolerates without any further bleeding today or tomorrow morning then ok to d/c home tomorrow. Would hold off on a colonoscopy unless rebleeding occurs since source of her bleeding is likely diverticular and it seems to have resolved and she had a colonoscopy 2 years ago so likelihood of a colon malignancy is low.   [place] C. 04/01/2017, 1:22 PM  Pager [phone]  AFTER 5 PM or on weekends please call [phone] ID: [name], female   DOB: 02-Apr-1928, 83 y.o.   MRN: [id number]

## 2017-04-02 DIAGNOSIS — K5791 Diverticulosis of intestine, part unspecified, without perforation or abscess with bleeding: Secondary | ICD-10-CM

## 2017-04-02 LAB — CBC WITH DIFFERENTIAL/PLATELET
BASOS PCT: 1 %
Basophils Absolute: 0 10*3/uL (ref 0.0–0.1)
EOS ABS: 0.4 10*3/uL (ref 0.0–0.7)
Eosinophils Relative: 6 %
HEMATOCRIT: 35 % — AB (ref 36.0–46.0)
HEMOGLOBIN: 11.3 g/dL — AB (ref 12.0–15.0)
Lymphocytes Relative: 19 %
Lymphs Abs: 1.2 10*3/uL (ref 0.7–4.0)
MCH: 31.7 pg (ref 26.0–34.0)
MCHC: 32.3 g/dL (ref 30.0–36.0)
MCV: 98.3 fL (ref 78.0–100.0)
MONOS PCT: 8 %
Monocytes Absolute: 0.5 10*3/uL (ref 0.1–1.0)
NEUTROS ABS: 4.1 10*3/uL (ref 1.7–7.7)
NEUTROS PCT: 66 %
Platelets: 295 10*3/uL (ref 150–400)
RBC: 3.56 MIL/uL — AB (ref 3.87–5.11)
RDW: 14.4 % (ref 11.5–15.5)
WBC: 6.3 10*3/uL (ref 4.0–10.5)

## 2017-04-02 LAB — GLUCOSE, CAPILLARY: Glucose-Capillary: 86 mg/dL (ref 65–99)

## 2017-04-02 NOTE — Discharge Summary (Signed)
Physician Discharge Summary  [name] IWP:[id number] DOB: [birth year] DOA: 03/30/2017  PCP: [name], MD  Admit date: 03/30/2017 Discharge date: 04/02/2017   Recommendations for Outpatient Follow-Up:   PRN GI follow up Stool regimen   Discharge Diagnosis:   Principal Problem:   Rectal bleeding Active Problems:   S/P placement of cardiac pacemaker   Hypothyroidism   CHB (complete heart block) [facility])   Discharge disposition:  Home  Discharge Condition: Improved.  Diet recommendation: low fat  Wound care: None.   History of Present Illness:   HPI: [name] is a 83 y.o. female with history of complete heart block status post pacemaker placement, hypothyroidism, breast cancer in remission, hypothyroidism and chronic hypotension who was just discharged yesterday after being observed for rectal bleeding which was thought to be possibly secondary to diverticular bleed presents to the ER after patient had 3 more episodes of bleeding.  Patient states after going home patient had supper following which patient had a bowel movement which was bloody.  Then later this morning when patient had breakfast again patient had another bloody bowel movement which was followed by another bloody bowel mount after lunch.  Patient came to the ER.  Denies any abdominal pain nausea denies any chest pain or shortness of breath or dizziness.  ED Course: In the ER patient's hemoglobin is around 10.  Patient abdomen appeared benign.  No further episodes of bleeding in the ER.  ER physician discussed with Dr. Bo Mcclintock on-call gastroenterologist, Sadie Haber who advised to admit and will be seeing patient in consult.      Hospital Course by Problem:   Rectal bleeding - colonoscopy in October 2016 which showed diverticulosis  -suspect patient is having diverticular bleed that has resolved -h/h stable with no further bleeding  Acute blood loss anemia -H/H stable  Chronic  hypotension -midodrine  History of complete heart block status post pacemaker placement.  Hypothyroidism  -Synthroid.  History of breast cancer in remission - femora.      Medical Consultants:    GI   Discharge Exam:   Vitals:   04/01/17 2050 04/02/17 0617  BP: (!) 100/51 (!) 104/59  Pulse: (!) 59 61  Resp: 18 18  Temp: (!) 97.5 F (36.4 C) (!) 97.5 F (36.4 C)  SpO2: 98% 98%   Vitals:   04/01/17 0628 04/01/17 1203 04/01/17 2050 04/02/17 0617  BP: 109/66 123/66 (!) 100/51 (!) 104/59  Pulse: 64 68 (!) 59 61  Resp: 18  18 18   Temp: 97.8 F (36.6 C)  (!) 97.5 F (36.4 C) (!) 97.5 F (36.4 C)  TempSrc: Oral  Oral Oral  SpO2: 97% 97% 98% 98%  Weight: 86.1 kg (189 lb 14.4 oz)   86.2 kg (190 lb 1.6 oz)  Height:        Gen:  NAD    The results of significant diagnostics from this hospitalization (including imaging, microbiology, ancillary and laboratory) are listed below for reference.     Procedures and Diagnostic Studies:   No results found.   Labs:   Basic Metabolic Panel: Recent Labs  Lab 03/26/17 1821 03/28/17 0455 03/28/17 1458 03/30/17 1458 03/31/17 0335  NA 140 141 137 139 142  K 3.8 3.4* 4.5 3.9 3.5  CL 106 112* 108 110 113*  CO2 23 26 21* 22 24  GLUCOSE 93 89 177* 94 85  BUN 22* 17 18 24* 19  CREATININE 1.08* 1.08* 1.11* 1.13* 1.05*  CALCIUM 8.7* 8.3* 8.3*  8.8* 8.2*  MG  --  1.9  --   --   --    GFR Estimated Creatinine Clearance: 43 mL/min (A) (by C-G formula based on SCr of 1.05 mg/dL (H)). Liver Function Tests: Recent Labs  Lab 03/26/17 1821 03/28/17 1458 03/30/17 1458  AST 18 27 18   ALT 13* 10* 12*  ALKPHOS 100 84 84  BILITOT 0.5 1.1 0.2*  PROT 6.3* 5.8* 5.8*  ALBUMIN 3.5 3.3* 3.4*   Recent Labs  Lab 03/26/17 1821  LIPASE 28   No results for input(s): AMMONIA in the last 168 hours. Coagulation profile Recent Labs  Lab 03/27/17 0213 03/30/17 1739  INR 0.98 0.96    CBC: Recent Labs  Lab  03/30/17 2145 03/31/17 0031 03/31/17 0335 04/01/17 1037 04/02/17 0842  WBC 7.1 6.4 5.9 6.2 6.3  NEUTROABS  --   --   --   --  4.1  HGB 9.6* 9.3* 9.4* 9.5* 11.3*  HCT 29.8* 28.4* 29.0* 30.2* 35.0*  MCV 96.8 95.0 95.7 97.7 98.3  PLT 232 249 243 255 295   Cardiac Enzymes: No results for input(s): CKTOTAL, CKMB, CKMBINDEX, TROPONINI in the last 168 hours. BNP: Invalid input(s): POCBNP CBG: Recent Labs  Lab 03/31/17 1804 04/01/17 1721 04/01/17 2358 04/02/17 0739  GLUCAP 125* 83 90 86   D-Dimer No results for input(s): DDIMER in the last 72 hours. Hgb A1c No results for input(s): HGBA1C in the last 72 hours. Lipid Profile No results for input(s): CHOL, HDL, LDLCALC, TRIG, CHOLHDL, LDLDIRECT in the last 72 hours. Thyroid function studies No results for input(s): TSH, T4TOTAL, T3FREE, THYROIDAB in the last 72 hours.  Invalid input(s): FREET3 Anemia work up No results for input(s): VITAMINB12, FOLATE, FERRITIN, TIBC, IRON, RETICCTPCT in the last 72 hours. Microbiology No results found for this or any previous visit (from the past 240 hour(s)).   Discharge Instructions:   Discharge Instructions    Discharge instructions   Complete by:  As directed    Low fat diet   Increase activity slowly   Complete by:  As directed      Allergies as of 04/02/2017      Reactions   Erythromycin Hives   Tape Other (See Comments)   BAND AIDS-SKIN IRRITATION   Codeine Nausea And Vomiting   Flagyl [metronidazole] Hives   Macrobid [nitrofurantoin Macrocrystal] Nausea And Vomiting   Tolterodine Nausea And Vomiting   Ciprofloxacin Hives, Rash      Medication List    TAKE these medications   CALTRATE 600+D PO Take 600 mg by mouth daily after breakfast.   CREON 36000 UNITS Cpep capsule Generic drug:  lipase/protease/amylase Take 36,000 Units by mouth 3 (three) times daily before meals.   furosemide 20 MG tablet Commonly known as:  LASIX Take 20 mg by mouth daily with  breakfast.   letrozole 2.5 MG tablet Commonly known as:  FEMARA Take 1 tablet (2.5 mg total) by mouth daily. What changed:  when to take this   levothyroxine 50 MCG tablet Commonly known as:  SYNTHROID, LEVOTHROID Take 50 mcg by mouth daily before breakfast.   midodrine 2.5 MG tablet Commonly known as:  PROAMATINE Take 2.5 mg by mouth daily after breakfast.   PROBIOTIC PO Take 1 tablet by mouth daily with breakfast.      Follow-up Information    [name], MD Follow up in 1 week(s).   Specialty:  Internal Medicine Why:  cbc Contact information: [address] [place]  [address] [phone]            Time coordinating discharge: 35 min  Signed:  Geradine Girt   [facility] 04/02/2017, 11:19 AM

## 2017-04-02 NOTE — Progress Notes (Signed)
[facility] Gastroenterology Progress Note  DARCELL YACOUB 83 y.o. May 02, 1927   Subjective: Owens Shark stools. Minimal abdominal pain. Tolerating soft diet.  Objective: Vital signs: Vitals:   04/01/17 2050 04/02/17 0617  BP: (!) 100/51 (!) 104/59  Pulse: (!) 59 61  Resp: 18 18  Temp: (!) 97.5 F (36.4 C) (!) 97.5 F (36.4 C)  SpO2: 98% 98%    Physical Exam: Gen: alert, no acute distress, elderly HEENT: anicteric sclera CV: RRR Chest: CTA B Abd: minimal diffuse tenderness without guarding, soft, nondistended   Lab Results: Recent Labs    03/30/17 1458 03/31/17 0335  NA 139 142  K 3.9 3.5  CL 110 113*  CO2 22 24  GLUCOSE 94 85  BUN 24* 19  CREATININE 1.13* 1.05*  CALCIUM 8.8* 8.2*   Recent Labs    03/30/17 1458  AST 18  ALT 12*  ALKPHOS 84  BILITOT 0.2*  PROT 5.8*  ALBUMIN 3.4*   Recent Labs    03/31/17 0335 04/01/17 1037  WBC 5.9 6.2  HGB 9.4* 9.5*  HCT 29.0* 30.2*  MCV 95.7 97.7  PLT 243 255      Assessment/Plan: S/P GI bleed - clinically resolved. Suspect diverticular source. CBC pending and if Hgb stable, ok to go home today on a low fat diet. Will sign off. Call if questions. F/U with GI prn.   Edna Bay C. 04/02/2017, 8:50 AM  Pager [phone]  AFTER 5 PM or on weekends please call [phone] ID: [name], female   DOB: July 29, 1927, 83 y.o.   MRN: [id number]

## 2017-04-02 NOTE — Progress Notes (Signed)
Patient ready for discharge to home. All discharge instructions reviewed with patient.  Follow up appts reviewed and stressed importance of keeping all appts.  All personal belongings with patient. No distress noted. Patient offers no c/o at this time.

## 2017-04-08 ENCOUNTER — Other Ambulatory Visit: Payer: Self-pay

## 2017-04-08 NOTE — Patient Outreach (Signed)
[facility]) Care Management  04/08/2017  [name] 08/12/1927 [id number]     EMMI- General Discharge RED ON EMMI ALERT Day # 1 Date: 04/05/17 Red Alert Reason: " Unfilled prescriptions? Yes"   Outreach attempt # 1 to patient. Spoke with patient who voices she is doing well since discharge home. Reviewed and addressed red alert with patient. She voices that she has all her meds and gets them through mail order. She reports that she figured out what was causing her bleeding-her baby ASA that she takes daily. She stets she is no longer taking it and has had no further bleeding episodes. RN CM verified that ASA was not listed on d/c paperwork med list. She had PCP f/u appt scheduled for Monday but due to bad weather office closed. She is aware that she needs to reschedule appt and has tried calling today but office still closed. She will try again tomorrow to make appt. She has transportation. She knows when and how to seek medical attention for changes in condition. She denies any further RN CM needs or concerns at this time.        Plan: RN CM will notify [facility] administrative assistant of case status.    Enzo Montgomery, RN,BSN,CCM [place] [facility] Telephonic Care Management Coordinator Direct Phone: [phone] Toll Free: [phone] Fax: [phone]

## 2017-04-23 DIAGNOSIS — E079 Disorder of thyroid, unspecified: Secondary | ICD-10-CM | POA: Insufficient documentation

## 2017-04-23 DIAGNOSIS — E6609 Other obesity due to excess calories: Secondary | ICD-10-CM | POA: Insufficient documentation

## 2017-04-23 DIAGNOSIS — E7800 Pure hypercholesterolemia, unspecified: Secondary | ICD-10-CM | POA: Insufficient documentation

## 2017-04-23 DIAGNOSIS — R112 Nausea with vomiting, unspecified: Secondary | ICD-10-CM | POA: Insufficient documentation

## 2017-04-23 DIAGNOSIS — Z9889 Other specified postprocedural states: Secondary | ICD-10-CM

## 2017-04-23 DIAGNOSIS — I951 Orthostatic hypotension: Secondary | ICD-10-CM | POA: Insufficient documentation

## 2017-04-28 ENCOUNTER — Ambulatory Visit (INDEPENDENT_AMBULATORY_CARE_PROVIDER_SITE_OTHER): Payer: Medicare Other | Admitting: *Deleted

## 2017-04-28 DIAGNOSIS — I442 Atrioventricular block, complete: Secondary | ICD-10-CM | POA: Diagnosis not present

## 2017-04-30 NOTE — Progress Notes (Signed)
Remote pacemaker transmission.   

## 2017-05-01 LAB — CUP PACEART REMOTE DEVICE CHECK
Date Time Interrogation Session: 20181231125841
Implantable Pulse Generator Implant Date: 20161027
Lead Channel Impedance Value: 0 Ohm
Lead Channel Impedance Value: 370 Ohm
Lead Channel Pacing Threshold Pulse Width: 0.4 ms
MDC IDC LEAD IMPLANT DT: 20141208
MDC IDC LEAD LOCATION: 753860
MDC IDC MSMT BATTERY IMPEDANCE: 480 Ohm
MDC IDC MSMT BATTERY REMAINING LONGEVITY: 83 mo
MDC IDC MSMT BATTERY VOLTAGE: 2.78 V
MDC IDC MSMT LEADCHNL RV PACING THRESHOLD AMPLITUDE: 1.125 V
MDC IDC SET LEADCHNL RV PACING AMPLITUDE: 2.25 V
MDC IDC SET LEADCHNL RV PACING PULSEWIDTH: 0.4 ms
MDC IDC SET LEADCHNL RV SENSING SENSITIVITY: 2 mV
MDC IDC STAT BRADY RV PERCENT PACED: 99 %

## 2017-05-02 ENCOUNTER — Encounter: Payer: Self-pay | Admitting: Cardiology

## 2017-05-03 NOTE — Progress Notes (Signed)
[name] Date of Birth: 08/08/1927 Medical Record #[id number]  History of Present Illness: [name] is seen today for follow up of complete heart block.  She has a history of complete heart block with 12 second pause and underwent permanent pacemaker implant on 10/09/2007 with a Medtronic adapta VVI pacemaker. She had revision of her pacemaker on February 23, 2015. She was admitted at that time with a GI bleed felt to be diverticular. No recurrence.  She also has a history of orthostatic dizziness managed with midodrine. She has no history of coronary disease or congestive heart failure. Last pacemaker check in December 2018 was normal.  She was admitted in June 2018 with acute diverticulitis. Treated with hydration and antibiotics. Admitted in November and December 2018 with hematochezia. Felt to be diverticular bleed. Conservative therapy recommended. ASA discontinued.   On follow up today she is doing well. No further GI complaints since ASA discontinued. Her lower extremity edema has resolved. Her weight is down 16 lbs. No dyspnea, chest pain, dizziness. Takes midodrine only once a day.  Current Outpatient Medications on File Prior to Visit  Medication Sig Dispense Refill  . Calcium Carbonate-Vitamin D (CALTRATE 600+D PO) Take 600 mg by mouth daily after breakfast.    . furosemide (LASIX) 20 MG tablet Take 20 mg by mouth every other day.      . letrozole (FEMARA) 2.5 MG tablet Take 1 tablet (2.5 mg total) by mouth daily. (Patient taking differently: Take 2.5 mg by mouth daily with breakfast. ) 90 tablet 3  . levothyroxine (SYNTHROID, LEVOTHROID) 50 MCG tablet Take 50 mcg by mouth daily before breakfast.     . lipase/protease/amylase (CREON) 36000 UNITS CPEP capsule Take 36,000 Units by mouth 3 (three) times daily before meals.    . midodrine (PROAMATINE) 2.5 MG tablet Take 2.5 mg by mouth daily after breakfast.     . Probiotic Product (PROBIOTIC PO) Take 1 tablet by mouth daily  with breakfast.     No current facility-administered medications on file prior to visit.     Allergies  Allergen Reactions  . Erythromycin Hives  . Tape Other (See Comments)    BAND AIDS-SKIN IRRITATION  . Codeine Nausea And Vomiting  . Flagyl [Metronidazole] Hives  . Macrobid [Nitrofurantoin Macrocrystal] Nausea And Vomiting  . Tolterodine Nausea And Vomiting  . Ciprofloxacin Hives and Rash    Past Medical History:  Diagnosis Date  . Cataract   . Complete heart block (HCC)    s/p PPM implant (MDT) by Dr Blanch Media.  Atrial lead could not be paced at time of the procedure.  She has chronic AV dysociation  . Delusional disorder ([place])   . Exogenous obesity   . Hypercholesterolemia   . Invasive ductal carcinoma of breast ([place]) 03/2007   BILATERAL BREASTS  . Orthostatic hypotension    treated with midodrine by Dr Rollene [name]  . PONV (postoperative nausea and vomiting)   . Thyroid disease   . Venous insufficiency     Past Surgical History:  Procedure Laterality Date  . ABDOMINAL HYSTERECTOMY    . BREAST LUMPECTOMY Bilateral 2009  . CATARACT EXTRACTION     EYE SURGERY X 2  . CHOLECYSTECTOMY    . COLONOSCOPY N/A 02/16/2015   Procedure: COLONOSCOPY;  Surgeon: Wilford Corner, MD;  Location[facility] ENDOSCOPY;  Service: Endoscopy;  Laterality: N/A;  . EP IMPLANTABLE DEVICE N/A 02/23/2015   pacemaker generator change (MDT Sensia SR) by Dr Rayann Heman   . ESOPHAGOGASTRODUODENOSCOPY N/A 02/16/2015  Procedure: ESOPHAGOGASTRODUODENOSCOPY (EGD);  Surgeon: Wilford Corner, MD;  Location: [facility] ENDOSCOPY;  Service: Endoscopy;  Laterality: N/A;  . PACEMAKER INSERTION     MDT implanted by Dr Blanch Media.  Atrial lead placement was unsuccessful.  She has chronic AV dysociation  . remote right radical nephrectomy  2009    Social History   Tobacco Use  Smoking Status Never Smoker  Smokeless Tobacco Never Used    Social History   Substance and Sexual Activity  Alcohol Use No  . Alcohol/week: 0.0  oz    Family History  Problem Relation Age of Onset  . Heart disease Maternal Grandmother   . Heart disease Mother     Review of Systems: As noted in history of present illness.  All other systems were reviewed and are negative.  Physical Exam: BP (!) 146/76 (BP Location: Left Arm, Patient Position: Sitting, Cuff Size: Normal)   Pulse 80   Ht 5' 7.5" (1.715 m)   Wt 191 lb (86.6 kg)   BMI 29.47 kg/m  GENERAL:  Well appearing, overweight WF in NAD HEENT:  PERRL, EOMI, sclera are clear. Oropharynx is clear. NECK:  No jugular venous distention, carotid upstroke brisk and symmetric, no bruits, no thyromegaly or adenopathy LUNGS:  Clear to auscultation bilaterally CHEST:  Unremarkable HEART:  RRR,  PMI not displaced or sustained,S1 and S2 within normal limits, no S3, no S4: no clicks, no rubs, no murmurs ABD:  Soft, nontender. BS +, no masses or bruits. No hepatomegaly, no splenomegaly EXT:  2 + pulses throughout, no edema, no cyanosis no clubbing SKIN:  Warm and dry.  No rashes NEURO:  Alert and oriented x 3. Cranial nerves II through XII intact. PSYCH:  Cognitively intact    LABORATORY DATA: Lab Results  Component Value Date   WBC 6.3 04/02/2017   HGB 11.3 (L) 04/02/2017   HCT 35.0 (L) 04/02/2017   PLT 295 04/02/2017   GLUCOSE 85 03/31/2017   CHOL 203 (H) 12/22/2012   TRIG 140 12/22/2012   HDL 54 12/22/2012   LDLCALC 121 (H) 12/22/2012   ALT 12 (L) 03/30/2017   AST 18 03/30/2017   NA 142 03/31/2017   K 3.5 03/31/2017   CL 113 (H) 03/31/2017   CREATININE 1.05 (H) 03/31/2017   BUN 19 03/31/2017   CO2 24 03/31/2017   TSH 3.612 05/05/2016   INR 0.96 03/30/2017   HGBA1C 6.0 (H) 08/11/2015   Labs dated 04/12/16: cholesterol 178, triglycerides 101, LDL 94, HDL 64. BUN 18, creatinine 1.24. Other chemistries, TSH, CBC normal. Dated 01/24/17: HDL 62, triglycerides 83, A1c 5.9%.  . Assessment / Plan: 1. History complete heart block status post permanent VVIR pacemaker  placement in June of 2009. Revised 02/23/15. Pacer check Dec 2018 was normal. Clinically doing well. She is followed by Dr. Rayann Heman in the pacer clinic. I'll followup again in one year.  2. History of orthostatic hypotension-on midodrine once a day. Asymptomatic.  3. Hyperlipidemia on statin therapy. Controlled.  4. History of recurrent diverticular bleed. Now off ASA.  5. LE edema due to venous stasis. Markedly improved from my prior visit. Will reduce lasix to every other day.

## 2017-05-05 DIAGNOSIS — Z961 Presence of intraocular lens: Secondary | ICD-10-CM | POA: Diagnosis not present

## 2017-05-05 DIAGNOSIS — H43811 Vitreous degeneration, right eye: Secondary | ICD-10-CM | POA: Diagnosis not present

## 2017-05-05 DIAGNOSIS — H401132 Primary open-angle glaucoma, bilateral, moderate stage: Secondary | ICD-10-CM | POA: Diagnosis not present

## 2017-05-05 DIAGNOSIS — H40013 Open angle with borderline findings, low risk, bilateral: Secondary | ICD-10-CM | POA: Diagnosis not present

## 2017-05-08 ENCOUNTER — Encounter: Payer: Self-pay | Admitting: Cardiology

## 2017-05-08 ENCOUNTER — Ambulatory Visit (INDEPENDENT_AMBULATORY_CARE_PROVIDER_SITE_OTHER): Payer: Medicare Other | Admitting: Cardiology

## 2017-05-08 VITALS — BP 146/76 | HR 80 | Ht 67.5 in | Wt 191.0 lb

## 2017-05-08 DIAGNOSIS — Z950 Presence of cardiac pacemaker: Secondary | ICD-10-CM | POA: Diagnosis not present

## 2017-05-08 DIAGNOSIS — R600 Localized edema: Secondary | ICD-10-CM | POA: Diagnosis not present

## 2017-05-08 DIAGNOSIS — I951 Orthostatic hypotension: Secondary | ICD-10-CM | POA: Diagnosis not present

## 2017-05-08 DIAGNOSIS — I442 Atrioventricular block, complete: Secondary | ICD-10-CM

## 2017-05-08 NOTE — Patient Instructions (Addendum)
Reduce lasix to every other day. If you have more swelling you can go back to taking daily  I will see you in one year

## 2017-06-03 DIAGNOSIS — R21 Rash and other nonspecific skin eruption: Secondary | ICD-10-CM | POA: Diagnosis not present

## 2017-06-04 ENCOUNTER — Encounter: Payer: Self-pay | Admitting: Podiatry

## 2017-06-04 ENCOUNTER — Ambulatory Visit (INDEPENDENT_AMBULATORY_CARE_PROVIDER_SITE_OTHER): Payer: Medicare Other | Admitting: Podiatry

## 2017-06-04 DIAGNOSIS — L989 Disorder of the skin and subcutaneous tissue, unspecified: Secondary | ICD-10-CM | POA: Diagnosis not present

## 2017-06-08 NOTE — Progress Notes (Signed)
   Subjective: 84 year old female presenting for follow up evaluation of a pre-ulcerative callus lesion of the right foot. She states she is doing well overall. She expresses concern about a mole on the LLE that has been present for the past 2-3 months. She has not done anything to treat the area. Patient presents today for further treatment and evaluation.   Past Medical History:  Diagnosis Date  . Cataract   . Complete heart block (HCC)    s/p PPM implant (MDT) by Dr Blanch Media.  Atrial lead could not be paced at time of the procedure.  She has chronic AV dysociation  . Delusional disorder ([place])   . Exogenous obesity   . Hypercholesterolemia   . Invasive ductal carcinoma of breast ([place]) 03/2007   BILATERAL BREASTS  . Orthostatic hypotension    treated with midodrine by Dr Rollene [name]  . PONV (postoperative nausea and vomiting)   . Thyroid disease   . Venous insufficiency      Objective:  Physical Exam General: Alert and oriented x3 in no acute distress  Dermatology: Hyperkeratotic lesions present on the right foot x 2. Pain on palpation with a central nucleated core noted.  Skin is warm, dry and supple bilateral lower extremities. Negative for open lesions or macerations.  Vascular: Palpable pedal pulses bilaterally. No edema or erythema noted. Capillary refill within normal limits.  Neurological: Epicritic and protective threshold diminished bilaterally.   Musculoskeletal Exam: Pain on palpation at the keratotic lesion noted. Range of motion within normal limits bilateral. Muscle strength 5/5 in all groups bilateral.  Assessment: #1 Pre-ulcerative callus right foot x 2   Plan of Care:  #1 Patient evaluated. #2 Excisional debridement of keratotic lesion using a chisel blade was performed without incident.  #3 Dressed area with light dressing. #4 Continue good, supportive shoes.  #5 Return to clinic in 3 months.    Edrick Kins, DPM [facility]  Dr. Edrick Kins, [place]                                        [place], [place] [address]                Office [phone]  Fax [phone]

## 2017-06-16 DIAGNOSIS — H43811 Vitreous degeneration, right eye: Secondary | ICD-10-CM | POA: Diagnosis not present

## 2017-06-16 DIAGNOSIS — Z961 Presence of intraocular lens: Secondary | ICD-10-CM | POA: Diagnosis not present

## 2017-06-16 DIAGNOSIS — H401132 Primary open-angle glaucoma, bilateral, moderate stage: Secondary | ICD-10-CM | POA: Diagnosis not present

## 2017-06-16 DIAGNOSIS — H40013 Open angle with borderline findings, low risk, bilateral: Secondary | ICD-10-CM | POA: Diagnosis not present

## 2017-07-15 DIAGNOSIS — Z85828 Personal history of other malignant neoplasm of skin: Secondary | ICD-10-CM | POA: Diagnosis not present

## 2017-07-15 DIAGNOSIS — L821 Other seborrheic keratosis: Secondary | ICD-10-CM | POA: Diagnosis not present

## 2017-07-15 DIAGNOSIS — L570 Actinic keratosis: Secondary | ICD-10-CM | POA: Diagnosis not present

## 2017-07-22 ENCOUNTER — Encounter: Payer: Self-pay | Admitting: Internal Medicine

## 2017-07-28 ENCOUNTER — Ambulatory Visit (INDEPENDENT_AMBULATORY_CARE_PROVIDER_SITE_OTHER): Payer: Medicare Other | Admitting: Podiatry

## 2017-07-28 ENCOUNTER — Ambulatory Visit (INDEPENDENT_AMBULATORY_CARE_PROVIDER_SITE_OTHER): Payer: Medicare Other | Admitting: *Deleted

## 2017-07-28 ENCOUNTER — Encounter: Payer: Self-pay | Admitting: Podiatry

## 2017-07-28 DIAGNOSIS — D239 Other benign neoplasm of skin, unspecified: Secondary | ICD-10-CM | POA: Diagnosis not present

## 2017-07-28 DIAGNOSIS — L989 Disorder of the skin and subcutaneous tissue, unspecified: Secondary | ICD-10-CM | POA: Diagnosis not present

## 2017-07-28 DIAGNOSIS — I442 Atrioventricular block, complete: Secondary | ICD-10-CM | POA: Diagnosis not present

## 2017-07-28 DIAGNOSIS — D2372 Other benign neoplasm of skin of left lower limb, including hip: Secondary | ICD-10-CM | POA: Diagnosis not present

## 2017-07-28 DIAGNOSIS — L97312 Non-pressure chronic ulcer of right ankle with fat layer exposed: Secondary | ICD-10-CM | POA: Diagnosis not present

## 2017-07-28 DIAGNOSIS — L97519 Non-pressure chronic ulcer of other part of right foot with unspecified severity: Secondary | ICD-10-CM

## 2017-07-28 DIAGNOSIS — I83015 Varicose veins of right lower extremity with ulcer other part of foot: Secondary | ICD-10-CM

## 2017-07-28 MED ORDER — GENTAMICIN SULFATE 0.1 % EX CREA: 1.0000 "application " | TOPICAL_CREAM | Freq: Three times a day (TID) | CUTANEOUS | 1 refills | Status: DC

## 2017-07-28 NOTE — Progress Notes (Signed)
Remote pacemaker transmission.   

## 2017-07-29 ENCOUNTER — Encounter: Payer: Self-pay | Admitting: Cardiology

## 2017-07-30 NOTE — Progress Notes (Signed)
   Subjective:  84 year old female presents today with a chief complaint of an ulceration to the lateral right ankle that appeared three weeks ago. She denies pain. She has not done anything to treat the wound. Patient is here for further evaluation and treatment.   Past Medical History:  Diagnosis Date  . Cataract   . Complete heart block (HCC)    s/p PPM implant (MDT) by Dr Blanch Media.  Atrial lead could not be paced at time of the procedure.  She has chronic AV dysociation  . Delusional disorder ([place])   . Exogenous obesity   . Hypercholesterolemia   . Invasive ductal carcinoma of breast ([place]) 03/2007   BILATERAL BREASTS  . Orthostatic hypotension    treated with midodrine by Dr Rollene [name]  . PONV (postoperative nausea and vomiting)   . Thyroid disease   . Venous insufficiency      Objective/Physical Exam General: The patient is alert and oriented x3 in no acute distress.  Dermatology:  Wound #1 noted to the lateral right ankle measuring 1.5 x 1.5 x 0.2 cm (LxWxD).   To the noted ulceration(s), there is no eschar. There is a moderate amount of slough, fibrin, and necrotic tissue noted. Granulation tissue and wound base is red. There is a minimal amount of serosanguineous drainage noted. There is no exposed bone muscle-tendon ligament or joint. There is no malodor. Periwound integrity is intact. Hyperkeratotic lesion present on the right plantar forefoot x 1. Pain on palpation with a central nucleated core noted. Skin is warm, dry and supple bilateral lower extremities.  Vascular: Palpable pedal pulses bilaterally. Mild edema noted. Capillary refill within normal limits. Varicosities noted bilateral lower extremities.   Neurological: Epicritic and protective threshold absent bilaterally.   Musculoskeletal Exam: Range of motion within normal limits to all pedal and ankle joints bilateral. Muscle strength 5/5 in all groups bilateral.   Assessment: #1 ulceration to the right lateral  ankle secondary to venous insufficiency #2 varicosities bilateral lower extremities #3 Pre-ulcerative callus lesion right plantar forefoot x 1  Plan of Care:  #1 Patient was evaluated. #2 medically necessary excisional debridement including subcutaneous tissue was performed using a tissue nipper and a chisel blade. Excisional debridement of all the necrotic nonviable tissue down to healthy bleeding viable tissue was performed with post-debridement measurements same as pre-. #3 the wound was cleansed with normal saline. #4 Prescription for gentamicin cream to be used daily with a Band-aid provided to patient.  #5 Excisional debridement of keratoic lesion using a chisel blade was performed without incident. Dressed with light dressing.  #6 Return to clinic in 3 weeks.   Edrick Kins, DPM [facility]  Dr. Edrick Kins, [place]                                        [place],  [address]                Office [phone]  Fax [phone]

## 2017-08-01 LAB — CUP PACEART REMOTE DEVICE CHECK
Battery Impedance: 531 Ohm
Battery Remaining Longevity: 81 mo
Battery Voltage: 2.78 V
Implantable Lead Implant Date: 20141208
Implantable Lead Model: 4076
Implantable Pulse Generator Implant Date: 20161027
Lead Channel Impedance Value: 406 Ohm
Lead Channel Pacing Threshold Amplitude: 1.125 V
Lead Channel Pacing Threshold Pulse Width: 0.4 ms
Lead Channel Setting Pacing Pulse Width: 0.4 ms
Lead Channel Setting Sensing Sensitivity: 2 mV
MDC IDC LEAD LOCATION: 753860
MDC IDC MSMT LEADCHNL RA IMPEDANCE VALUE: 0 Ohm
MDC IDC SESS DTM: 20190401115710
MDC IDC SET LEADCHNL RV PACING AMPLITUDE: 2.25 V
MDC IDC STAT BRADY RV PERCENT PACED: 99 %

## 2017-08-04 ENCOUNTER — Ambulatory Visit (INDEPENDENT_AMBULATORY_CARE_PROVIDER_SITE_OTHER): Payer: Medicare Other | Admitting: Internal Medicine

## 2017-08-04 ENCOUNTER — Encounter: Payer: Self-pay | Admitting: Internal Medicine

## 2017-08-04 VITALS — BP 120/80 | HR 83 | Ht 67.0 in | Wt 208.0 lb

## 2017-08-04 DIAGNOSIS — Z950 Presence of cardiac pacemaker: Secondary | ICD-10-CM | POA: Diagnosis not present

## 2017-08-04 DIAGNOSIS — I951 Orthostatic hypotension: Secondary | ICD-10-CM | POA: Diagnosis not present

## 2017-08-04 DIAGNOSIS — I442 Atrioventricular block, complete: Secondary | ICD-10-CM

## 2017-08-04 LAB — CUP PACEART INCLINIC DEVICE CHECK
Battery Remaining Longevity: 81 mo
Battery Voltage: 2.77 V
Implantable Lead Implant Date: 20141208
Implantable Lead Location: 753860
Implantable Lead Model: 4076
Implantable Pulse Generator Implant Date: 20161027
Lead Channel Impedance Value: 401 Ohm
Lead Channel Pacing Threshold Amplitude: 1.125 V
Lead Channel Pacing Threshold Pulse Width: 0.4 ms
Lead Channel Pacing Threshold Pulse Width: 0.4 ms
Lead Channel Setting Pacing Amplitude: 2.25 V
Lead Channel Setting Pacing Pulse Width: 0.4 ms
Lead Channel Setting Sensing Sensitivity: 2 mV
MDC IDC MSMT BATTERY IMPEDANCE: 530 Ohm
MDC IDC MSMT LEADCHNL RA IMPEDANCE VALUE: 0 Ohm
MDC IDC MSMT LEADCHNL RV PACING THRESHOLD AMPLITUDE: 1.25 V
MDC IDC SESS DTM: 20190408102414
MDC IDC STAT BRADY RV PERCENT PACED: 99 %

## 2017-08-04 NOTE — Progress Notes (Signed)
PCP: [name], MD Primary Cardiologist:  Dr Martinique Primary EP:  Dr Rayann Heman  [name] is a 84 y.o. female who presents today for routine electrophysiology followup.  Since last being seen in our clinic, the patient reports doing very well.  Today, she denies symptoms of palpitations, chest pain, shortness of breath,  lower extremity edema, dizziness, presyncope, or syncope.  The patient is otherwise without complaint today.   Past Medical History:  Diagnosis Date  . Cataract   . Complete heart block (HCC)    s/p PPM implant (MDT) by Dr Blanch Media.  Atrial lead could not be paced at time of the procedure.  She has chronic AV dysociation  . Delusional disorder ([place])   . Exogenous obesity   . Hypercholesterolemia   . Invasive ductal carcinoma of breast ([place]) 03/2007   BILATERAL BREASTS  . Orthostatic hypotension    treated with midodrine by Dr Rollene [name]  . PONV (postoperative nausea and vomiting)   . Thyroid disease   . Venous insufficiency    Past Surgical History:  Procedure Laterality Date  . ABDOMINAL HYSTERECTOMY    . BREAST LUMPECTOMY Bilateral 2009  . CATARACT EXTRACTION     EYE SURGERY X 2  . CHOLECYSTECTOMY    . COLONOSCOPY N/A 02/16/2015   Procedure: COLONOSCOPY;  Surgeon: Wilford Corner, MD;  Location: [facility] ENDOSCOPY;  Service: Endoscopy;  Laterality: N/A;  . EP IMPLANTABLE DEVICE N/A 02/23/2015   pacemaker generator change (MDT Sensia SR) by Dr Rayann Heman   . ESOPHAGOGASTRODUODENOSCOPY N/A 02/16/2015   Procedure: ESOPHAGOGASTRODUODENOSCOPY (EGD);  Surgeon: Wilford Corner, MD;  Location: [facility] ENDOSCOPY;  Service: Endoscopy;  Laterality: N/A;  . PACEMAKER INSERTION     MDT implanted by Dr Blanch Media.  Atrial lead placement was unsuccessful.  She has chronic AV dysociation  . remote right radical nephrectomy  2009    ROS- all systems are reviewed and negative except as per HPI above  Current Outpatient Medications  Medication Sig Dispense Refill  . Calcium  Carbonate-Vitamin D (CALTRATE 600+D PO) Take 600 mg by mouth daily after breakfast.    . furosemide (LASIX) 20 MG tablet Take 20 mg by mouth every other day.      Marland Kitchen gentamicin cream (GARAMYCIN) 0.1 % Apply 1 application topically 3 (three) times daily. 30 g 1  . letrozole (FEMARA) 2.5 MG tablet Take 1 tablet (2.5 mg total) by mouth daily. (Patient taking differently: Take 2.5 mg by mouth daily with breakfast. ) 90 tablet 3  . levothyroxine (SYNTHROID, LEVOTHROID) 50 MCG tablet Take 50 mcg by mouth daily before breakfast.     . lipase/protease/amylase (CREON) 36000 UNITS CPEP capsule Take 36,000 Units by mouth 3 (three) times daily before meals.    . midodrine (PROAMATINE) 2.5 MG tablet Take 2.5 mg by mouth daily after breakfast.      No current facility-administered medications for this visit.     Physical Exam: Vitals:   08/04/17 1003  BP: 120/80  Pulse: 83  SpO2: 96%  Weight: 208 lb (94.3 kg)  Height: 5\' 7"  (1.702 m)    GEN- The patient is elderly appearing, alert and oriented x 3 today.   Head- normocephalic, atraumatic Eyes-  Sclera clear, conjunctiva pink Ears- hearing intact Oropharynx- clear Lungs- Clear to ausculation bilaterally, normal work of breathing Chest- pacemaker pocket is well healed Heart- Regular rate and rhythm, no murmurs, rubs or gallops, PMI not laterally displaced GI- soft, NT, ND, + BS Extremities- no clubbing, cyanosis, or edema  Pacemaker interrogation- reviewed in detail today,  See PACEART report  ekg tracing ordered today is personally reviewed and shows sinus with Vacing (VA dissocation--> VVIR programming)  Assessment and Plan:  1. Symptomatic complete heart block Normal pacemaker function See Claudia Desanctis Art report She only has a single lead (Dr Thersa Salt unable to implant atrial lead at initial implant) but is doing well with this. No changes today  2. Overweight Body mass index is 32.58 kg/m. Stable  3. History of orthostasis Controlled  with midodrine No changes  Carelink Return to see EP NP every year I will see when needed Follow-up with Dr Martinique as scheduled  Thompson Grayer MD, [facility] 08/04/2017 10:16 AM

## 2017-08-04 NOTE — Patient Instructions (Addendum)
Medication Instructions:  Your physician recommends that you continue on your current medications as directed. Please refer to the Current Medication list given to you today.  Labwork: None ordered.  Testing/Procedures: None ordered.  Follow-Up: Your physician wants you to follow-up in: one year with Chanetta Marshall, NP.   You will receive a reminder letter in the mail two months in advance. If you don't receive a letter, please call our office to schedule the follow-up appointment.  Remote monitoring is used to monitor your Pacemaker from home. This monitoring reduces the number of office visits required to check your device to one time per year. It allows [place] to keep an eye on the functioning of your device to ensure it is working properly. You are scheduled for a device check from home on 10/27/2017. You may send your transmission at any time that day. If you have a wireless device, the transmission will be sent automatically. After your physician reviews your transmission, you will receive a postcard with your next transmission date.  Any Other Special Instructions Will Be Listed Below (If Applicable).  If you need a refill on your cardiac medications before your next appointment, please call your pharmacy.

## 2017-08-06 DIAGNOSIS — C649 Malignant neoplasm of unspecified kidney, except renal pelvis: Secondary | ICD-10-CM | POA: Diagnosis not present

## 2017-08-06 DIAGNOSIS — R3911 Hesitancy of micturition: Secondary | ICD-10-CM | POA: Diagnosis not present

## 2017-08-18 ENCOUNTER — Encounter: Payer: Self-pay | Admitting: Podiatry

## 2017-08-18 ENCOUNTER — Ambulatory Visit (INDEPENDENT_AMBULATORY_CARE_PROVIDER_SITE_OTHER): Payer: Medicare Other | Admitting: Podiatry

## 2017-08-18 DIAGNOSIS — I83015 Varicose veins of right lower extremity with ulcer other part of foot: Secondary | ICD-10-CM

## 2017-08-18 DIAGNOSIS — L97312 Non-pressure chronic ulcer of right ankle with fat layer exposed: Secondary | ICD-10-CM

## 2017-08-18 DIAGNOSIS — L97519 Non-pressure chronic ulcer of other part of right foot with unspecified severity: Secondary | ICD-10-CM

## 2017-08-20 NOTE — Progress Notes (Signed)
   Subjective:  84 year old female presents today for follow up evaluation of an ulceration located on the right ankle. She states the area looks as if it has improved. She has been applying the gentamicin cream daily as directed. There are no modifying factors noted. Patient is here for further evaluation and treatment.   Past Medical History:  Diagnosis Date  . Cataract   . Complete heart block (HCC)    s/p PPM implant (MDT) by Dr Blanch Media.  Atrial lead could not be paced at time of the procedure.  She has chronic AV dysociation  . Delusional disorder ([place])   . Exogenous obesity   . Hypercholesterolemia   . Invasive ductal carcinoma of breast ([place]) 03/2007   BILATERAL BREASTS  . Orthostatic hypotension    treated with midodrine by Dr Rollene [name]  . PONV (postoperative nausea and vomiting)   . Thyroid disease   . Venous insufficiency      Objective/Physical Exam General: The patient is alert and oriented x3 in no acute distress.  Dermatology:  Wound #1 noted to the lateral right ankle measuring 1.8 x 1.8 x 0.2 cm (LxWxD).   To the noted ulceration(s), there is no eschar. There is a moderate amount of slough, fibrin, and necrotic tissue noted. Granulation tissue and wound base is red. There is a minimal amount of serosanguineous drainage noted. There is no exposed bone muscle-tendon ligament or joint. There is no malodor. Periwound integrity is intact. Skin is warm, dry and supple bilateral lower extremities.  Vascular: Palpable pedal pulses bilaterally. Mild edema noted. Capillary refill within normal limits. Varicosities noted bilateral lower extremities.   Neurological: Epicritic and protective threshold absent bilaterally.   Musculoskeletal Exam: Range of motion within normal limits to all pedal and ankle joints bilateral. Muscle strength 5/5 in all groups bilateral.       Assessment: #1 ulceration to the right lateral ankle secondary to venous insufficiency #2 varicosities  bilateral lower extremities   Plan of Care:  #1 Patient was evaluated. #2 medically necessary excisional debridement including subcutaneous tissue was performed using a tissue nipper and a chisel blade. Excisional debridement of all the necrotic nonviable tissue down to healthy bleeding viable tissue was performed with post-debridement measurements same as pre-. #3 the wound was cleansed with normal saline. #4 Continue using gentamicin cream daily with a Band-aid.  #5 Return to clinic in 3 weeks.   Edrick Kins, DPM [facility]  Dr. Edrick Kins, [address]                                        [place], [place] [address]                Office [phone]  Fax [phone]

## 2017-08-28 DIAGNOSIS — I10 Essential (primary) hypertension: Secondary | ICD-10-CM | POA: Diagnosis not present

## 2017-08-28 DIAGNOSIS — E11621 Type 2 diabetes mellitus with foot ulcer: Secondary | ICD-10-CM | POA: Diagnosis not present

## 2017-08-28 DIAGNOSIS — I951 Orthostatic hypotension: Secondary | ICD-10-CM | POA: Diagnosis not present

## 2017-08-28 DIAGNOSIS — E039 Hypothyroidism, unspecified: Secondary | ICD-10-CM | POA: Diagnosis not present

## 2017-09-08 ENCOUNTER — Ambulatory Visit (INDEPENDENT_AMBULATORY_CARE_PROVIDER_SITE_OTHER): Payer: Medicare Other | Admitting: Podiatry

## 2017-09-08 DIAGNOSIS — I83015 Varicose veins of right lower extremity with ulcer other part of foot: Secondary | ICD-10-CM

## 2017-09-08 DIAGNOSIS — L97312 Non-pressure chronic ulcer of right ankle with fat layer exposed: Secondary | ICD-10-CM

## 2017-09-08 DIAGNOSIS — L97519 Non-pressure chronic ulcer of other part of right foot with unspecified severity: Secondary | ICD-10-CM | POA: Diagnosis not present

## 2017-09-08 MED ORDER — GENTAMICIN SULFATE 0.1 % EX CREA: 1.0000 "application " | TOPICAL_CREAM | Freq: Three times a day (TID) | CUTANEOUS | 1 refills | Status: DC

## 2017-09-10 ENCOUNTER — Emergency Department (HOSPITAL_COMMUNITY)
Admission: EM | Admit: 2017-09-10 | Discharge: 2017-09-11 | Disposition: A | Discharge status: Home / Self Care | Payer: Medicare Other | Attending: Emergency Medicine | Admitting: Emergency Medicine

## 2017-09-10 ENCOUNTER — Encounter (HOSPITAL_COMMUNITY): Payer: Self-pay

## 2017-09-10 ENCOUNTER — Emergency Department (HOSPITAL_COMMUNITY): Payer: Medicare Other

## 2017-09-10 DIAGNOSIS — L97311 Non-pressure chronic ulcer of right ankle limited to breakdown of skin: Secondary | ICD-10-CM | POA: Diagnosis not present

## 2017-09-10 DIAGNOSIS — R079 Chest pain, unspecified: Secondary | ICD-10-CM | POA: Diagnosis not present

## 2017-09-10 DIAGNOSIS — Z853 Personal history of malignant neoplasm of breast: Secondary | ICD-10-CM | POA: Insufficient documentation

## 2017-09-10 DIAGNOSIS — Z79899 Other long term (current) drug therapy: Secondary | ICD-10-CM | POA: Diagnosis not present

## 2017-09-10 DIAGNOSIS — R1310 Dysphagia, unspecified: Secondary | ICD-10-CM | POA: Insufficient documentation

## 2017-09-10 DIAGNOSIS — E039 Hypothyroidism, unspecified: Secondary | ICD-10-CM | POA: Diagnosis not present

## 2017-09-10 DIAGNOSIS — Z950 Presence of cardiac pacemaker: Secondary | ICD-10-CM | POA: Insufficient documentation

## 2017-09-10 DIAGNOSIS — R0602 Shortness of breath: Secondary | ICD-10-CM | POA: Diagnosis not present

## 2017-09-10 DIAGNOSIS — L03115 Cellulitis of right lower limb: Secondary | ICD-10-CM | POA: Insufficient documentation

## 2017-09-10 DIAGNOSIS — T17320A Food in larynx causing asphyxiation, initial encounter: Secondary | ICD-10-CM | POA: Diagnosis not present

## 2017-09-10 LAB — BASIC METABOLIC PANEL
Anion gap: 8 (ref 5–15)
BUN: 18 mg/dL (ref 6–20)
CO2: 27 mmol/L (ref 22–32)
CREATININE: 1.13 mg/dL — AB (ref 0.44–1.00)
Calcium: 8.8 mg/dL — ABNORMAL LOW (ref 8.9–10.3)
Chloride: 105 mmol/L (ref 101–111)
GFR calc Af Amer: 49 mL/min — ABNORMAL LOW (ref >60)
GFR, EST NON AFRICAN AMERICAN: 42 mL/min — AB (ref >60)
GLUCOSE: 106 mg/dL — AB (ref 65–99)
POTASSIUM: 4.9 mmol/L (ref 3.5–5.1)
Sodium: 140 mmol/L (ref 135–145)

## 2017-09-10 LAB — CBC
HCT: 46 % (ref 36.0–46.0)
Hemoglobin: 14.3 g/dL (ref 12.0–15.0)
MCH: 29.7 pg (ref 26.0–34.0)
MCHC: 31.1 g/dL (ref 30.0–36.0)
MCV: 95.6 fL (ref 78.0–100.0)
Platelets: 238 10*3/uL (ref 150–400)
RBC: 4.81 MIL/uL (ref 3.87–5.11)
RDW: 14.3 % (ref 11.5–15.5)
WBC: 6.3 10*3/uL (ref 4.0–10.5)

## 2017-09-10 LAB — TROPONIN I: Troponin I: <0.03 ng/mL (ref <0.03)

## 2017-09-10 LAB — BRAIN NATRIURETIC PEPTIDE: B Natriuretic Peptide: 193.1 pg/mL — ABNORMAL HIGH (ref 0.0–100.0)

## 2017-09-10 NOTE — Progress Notes (Signed)
   Subjective:  84 year old female presents today for follow up evaluation of an ulceration located on the right ankle. She states the wound is about the same. She states she feels well overall. She has been using the gentamicin daily as directed. There are no modifying factors noted. Patient is here for further evaluation and treatment.   Past Medical History:  Diagnosis Date  . Cataract   . Complete heart block (HCC)    s/p PPM implant (MDT) by Dr Blanch Media.  Atrial lead could not be paced at time of the procedure.  She has chronic AV dysociation  . Delusional disorder ([place])   . Exogenous obesity   . Hypercholesterolemia   . Invasive ductal carcinoma of breast ([place]) 03/2007   BILATERAL BREASTS  . Orthostatic hypotension    treated with midodrine by Dr Rollene [name]  . PONV (postoperative nausea and vomiting)   . Thyroid disease   . Venous insufficiency      Objective/Physical Exam General: The patient is alert and oriented x3 in no acute distress.  Dermatology:  Wound #1 noted to the lateral right ankle measuring 1.5 x 1.5 x 0.2 cm (LxWxD).   To the noted ulceration(s), there is no eschar. There is a moderate amount of slough, fibrin, and necrotic tissue noted. Granulation tissue and wound base is red. There is a minimal amount of serosanguineous drainage noted. There is no exposed bone muscle-tendon ligament or joint. There is no malodor. Periwound integrity is intact. Skin is warm, dry and supple bilateral lower extremities.  Vascular: Palpable pedal pulses bilaterally. Mild edema noted. Capillary refill within normal limits. Varicosities noted bilateral lower extremities.   Neurological: Epicritic and protective threshold absent bilaterally.   Musculoskeletal Exam: Range of motion within normal limits to all pedal and ankle joints bilateral. Muscle strength 5/5 in all groups bilateral.    Assessment: #1 ulceration to the right lateral ankle secondary to venous insufficiency #2  varicosities bilateral lower extremities   Plan of Care:  #1 Patient was evaluated. #2 medically necessary excisional debridement including subcutaneous tissue was performed using a tissue nipper and a chisel blade. Excisional debridement of all the necrotic nonviable tissue down to healthy bleeding viable tissue was performed with post-debridement measurements same as pre-. #3 the wound was cleansed with normal saline. #4 Continue using gentamicin cream daily with a Band-aid.  #5 Refill prescription for gentamicin cream provided to patient.  #6 Return to clinic in 3 weeks.   Edrick Kins, DPM [facility]  Dr. Edrick Kins, [address]                                        [place], [place] [address]                Office [phone]  Fax [phone]

## 2017-09-10 NOTE — ED Triage Notes (Signed)
Pt comes via [facility] after choking on a piece of toast and then had anxiety afterwards. Pt has bilateral edema in lower legs and reports that she has been only taking half of her lasix for two weeks, reports some SOB

## 2017-09-11 MED ORDER — DOXYCYCLINE HYCLATE 100 MG PO CAPS: 100.0000 mg | ORAL_CAPSULE | Freq: Two times a day (BID) | ORAL | 0 refills | Status: DC

## 2017-09-11 NOTE — ED Notes (Signed)
Pt. Unable to sign due to computer malfunction. Discharge paperwork and prescriptions discussed. Pt. Did not have any further questions.

## 2017-09-11 NOTE — Discharge Instructions (Addendum)
Seen today for several complaints.  Regarding difficulty swallowing.  This seems to have resolved on its own.  Follow-up with gastroenterology for EGD to ensure that there are no strictures or webs.  Make sure to drink plenty of fluids when you eat or take medications.  Also make sure to cut up your food into small pieces.  Additionally you have this ulcer on the right lower extremity.  There appears to be a mild infection associated with this.  Take medications as prescribed.  Follow-up with Dr. [name] and your primary physician.

## 2017-09-11 NOTE — ED Provider Notes (Signed)
[facility] EMERGENCY DEPARTMENT Provider Note   CSN: [id number] Arrival date & time: 09/10/17  2019     History   Chief Complaint Chief Complaint  Patient presents with  . Anxiety  . Shortness of Breath    HPI [name] is a 84 y.o. female.  HPI  This is a very nice 84 year old female with history of heart block, hypercholesterolemia, breast cancer who presents with difficulty swallowing, shortness of breath.  Patient reports that she was in her normal state of health this evening.  She got up to get something to eat.  She ate an egg.  She subsequently ate a piece of toast but felt like it got stuck.  She had difficulty "getting it down."  She states that she felt short of breath during this time.  She does not have any chest pain.  She felt nauseous but did not vomit.  She denies any abdominal pain.  No history of difficulty eating or pain with eating.  She currently states that she feels back to baseline.  She is swallowing without difficulty and has no pain.  Additionally she states that she has noted some swelling of the lower extremities.  She previously was taking 20 mg of Lasix daily.  She now is taking 10 mg of Lasix daily.  She also has a chronic ulcer of the right ankle for which she sees Dr. [name].  She is applying an ointment daily.  She denies any fevers or systemic symptoms.  Currently states that she is at her baseline.  Past Medical History:  Diagnosis Date  . Cataract   . Complete heart block (HCC)    s/p PPM implant (MDT) by Dr Blanch Media.  Atrial lead could not be paced at time of the procedure.  She has chronic AV dysociation  . Delusional disorder ([place])   . Exogenous obesity   . Hypercholesterolemia   . Invasive ductal carcinoma of breast ([place]) 03/2007   BILATERAL BREASTS  . Orthostatic hypotension    treated with midodrine by Dr Rollene [name]  . PONV (postoperative nausea and vomiting)   . Thyroid disease   . Venous insufficiency      Patient Active Problem List   Diagnosis Date Noted  . Thyroid disease   . PONV (postoperative nausea and vomiting)   . Orthostatic hypotension   . Hypercholesterolemia   . Exogenous obesity   . Rectal bleeding 03/30/2017  . GI bleed 03/28/2017  . BRBPR (bright red blood per rectum) 03/27/2017  . Sigmoid diverticulitis 10/14/2016  . Diverticulitis 10/14/2016  . Varicose veins of bilateral lower extremities with other complications 60/63/0160  . Venous insufficiency 08/25/2015  . Cellulitis 08/09/2015  . Pressure ulcer 08/09/2015  . Delusional disorder ([place]) 07/05/2015  . Bereavement reaction 07/05/2015  . Pacemaker at end of battery life 02/23/2015  . CHB (complete heart block) ([place])   . Acute GI bleeding 02/15/2015  . Acute kidney injury (nontraumatic) ([place]) 02/15/2015  . Hypothyroidism 02/15/2015  . Left upper quadrant pain   . Osteopenia 12/22/2013  . Postural dizziness 04/05/2013  . Complete heart block ([place]) 02/26/2013  . S/P placement of cardiac pacemaker 02/26/2013  . Bilateral breast cancer ([place]) 03/25/2011  . Invasive ductal carcinoma of breast ([place]) 03/30/2007    Past Surgical History:  Procedure Laterality Date  . ABDOMINAL HYSTERECTOMY    . BREAST LUMPECTOMY Bilateral 2009  . CATARACT EXTRACTION     EYE SURGERY X 2  . CHOLECYSTECTOMY    .  COLONOSCOPY N/A 02/16/2015   Procedure: COLONOSCOPY;  Surgeon: Wilford Corner, MD;  Location: [facility] ENDOSCOPY;  Service: Endoscopy;  Laterality: N/A;  . EP IMPLANTABLE DEVICE N/A 02/23/2015   pacemaker generator change (MDT Sensia SR) by Dr Rayann Heman   . ESOPHAGOGASTRODUODENOSCOPY N/A 02/16/2015   Procedure: ESOPHAGOGASTRODUODENOSCOPY (EGD);  Surgeon: Wilford Corner, MD;  Location: [facility] ENDOSCOPY;  Service: Endoscopy;  Laterality: N/A;  . PACEMAKER INSERTION     MDT implanted by Dr Blanch Media.  Atrial lead placement was unsuccessful.  She has chronic AV dysociation  . remote right radical nephrectomy  2009     OB History    None      Home Medications    Prior to Admission medications   Medication Sig Start Date End Date Taking? Authorizing Provider  Calcium Carbonate-Vitamin D (CALTRATE 600+D PO) Take 600 mg by mouth daily after breakfast.   Yes [provider]  clotrimazole-betamethasone (LOTRISONE) cream APPLY TO AFFECTED AREA TWICE A DAY 06/03/17  Yes [provider]  furosemide (LASIX) 20 MG tablet Take 10 mg by mouth daily.    Yes [provider]  gentamicin cream (GARAMYCIN) 0.1 % Apply 1 application topically 3 (three) times daily. 09/08/17  Yes Edrick Kins, DPM  latanoprost (XALATAN) 0.005 % ophthalmic solution USE 1 DROP INTO BOTH EYES AT BEDTIME 07/03/17  Yes [provider]  letrozole (FEMARA) 2.5 MG tablet Take 1 tablet (2.5 mg total) by mouth daily. Patient taking differently: Take 2.5 mg by mouth daily with breakfast.  09/19/16  Yes [name], MD  levothyroxine (SYNTHROID, LEVOTHROID) 50 MCG tablet Take 50 mcg by mouth daily before breakfast.    Yes [provider]  lipase/protease/amylase (CREON) 36000 UNITS CPEP capsule Take 36,000 Units by mouth 3 (three) times daily before meals.   Yes [provider]  midodrine (PROAMATINE) 2.5 MG tablet Take 2.5 mg by mouth daily after breakfast.    Yes [provider]  doxycycline (VIBRAMYCIN) 100 MG capsule Take 1 capsule (100 mg total) by mouth 2 (two) times daily. 09/11/17   Yong Grieser, Barbette Hair, MD    Family History Family History  Problem Relation Age of Onset  . Heart disease Maternal Grandmother   . Heart disease Mother     Social History Social History   Tobacco Use  . Smoking status: Never Smoker  . Smokeless tobacco: Never Used  Substance Use Topics  . Alcohol use: No    Alcohol/week: 0.0 oz  . Drug use: No     Allergies   Amoxicillin-pot clavulanate; Erythromycin; Tape; Codeine; Flagyl [metronidazole]; Macrobid [nitrofurantoin macrocrystal]; Tolterodine; and  Ciprofloxacin   Review of Systems Review of Systems  Constitutional: Negative for fever.  HENT: Positive for trouble swallowing.   Respiratory: Positive for shortness of breath. Negative for cough.   Cardiovascular: Negative for chest pain.  Gastrointestinal: Positive for nausea. Negative for abdominal pain and vomiting.  Genitourinary: Negative for dysuria.  Skin: Positive for color change and wound.  All other systems reviewed and are negative.    Physical Exam Updated Vital Signs BP 126/75   Pulse 65   Temp 97.9 F (36.6 C) (Oral)   Resp 18   SpO2 96%   Physical Exam  Constitutional: She is oriented to person, place, and time. She appears well-developed and well-nourished.  Elderly but nontoxic-appearing, no acute distress, ABCs intact  HENT:  Head: Normocephalic and atraumatic.  Neck: Neck supple.  Cardiovascular: Normal rate, regular rhythm and normal heart sounds.  Pulmonary/Chest: Effort normal.  No stridor. No respiratory distress. She has no wheezes.  Abdominal: Soft. Bowel sounds are normal. There is no tenderness.  Musculoskeletal:  Asymmetric bilateral lower extremity edema  Neurological: She is alert and oriented to person, place, and time.  Skin: Skin is warm and dry.  3 cm circular ulcerative lesion over the lateral aspect of the right ankle, granular base, no purulence noted, slight erythema extending proximally, mild warmth  Psychiatric: She has a normal mood and affect.  Nursing note and vitals reviewed.    ED Treatments / Results  Labs (all labs ordered are listed, but only abnormal results are displayed) Labs Reviewed  BASIC METABOLIC PANEL - Abnormal; Notable for the following components:      Result Value   Glucose, Bld 106 (*)    Creatinine, Ser 1.13 (*)    Calcium 8.8 (*)    GFR calc non Af Amer 42 (*)    GFR calc Af Amer 49 (*)    All other components within normal limits  BRAIN NATRIURETIC PEPTIDE - Abnormal; Notable for the following  components:   B Natriuretic Peptide 193.1 (*)    All other components within normal limits  CBC  TROPONIN I    EKG EKG Interpretation  Date/Time:  Wednesday Sep 10 2017 20:27:24 EDT Ventricular Rate:  67 PR Interval:    QRS Duration: 174 QT Interval:  472 QTC Calculation: 498 R Axis:   -79 Text Interpretation:  Ventricular-paced rhythm Abnormal ECG Now paced Confirmed by Thayer Jew [phone]) on 09/11/2017 2:42:26 AM   Radiology Dg Chest 2 View  Result Date: 09/10/2017 CLINICAL DATA:  Shortness of breath, LEFT chest pain. EXAM: CHEST - 2 VIEW COMPARISON:  Chest radiograph January 7th 1,018 FINDINGS: Cardiac silhouette is moderately enlarged and unchanged. Mediastinal silhouette is nonsuspicious. No pleural effusion. Stable bibasilar bandlike densities. No pleural effusion or focal consolidation. Mild hyperinflation. No pneumothorax. Single lead LEFT cardiac pacemaker in situ. Surgical clips in the included right abdomen compatible with cholecystectomy. Osteopenia. IMPRESSION: Stable cardiomegaly and bibasilar atelectasis/scarring. Similar hyperinflation. Electronically Signed   By: Elon Alas M.D.   On: 09/10/2017 22:36    Procedures Procedures (including critical care time)  Medications Ordered in ED Medications - No data to display   Initial Impression / Assessment and Plan / ED Course  I have reviewed the triage vital signs and the nursing notes.  Pertinent labs & imaging results that were available during my care of the patient were reviewed by me and considered in my medical decision making (see chart for details).     Patient presents with episode of a done aphasia and shortness of breath.  She is currently at baseline and has no complaints.  She has a chronic right ankle ulcer which is being treated by Dr. [name].  She is nontoxic-appearing on exam.  ABCs intact.  Vital signs reassuring.  Work-up reviewed from triage.  Lab work is reassuring.  Troponin negative.   Chest x-ray stable without evidence of pneumothorax, pneumonia.  Suspect her sensations of shortness of breath were likely related to an intermittent food impaction.  It appears to have clear at this point.  She is able to tolerate fluids without difficulty.  Additionally, she does have a chronic ulcer in the right ankle.  There is mild erythema and she feels it has gotten more swollen.  For this reason, will place on antibiotics and have her follow-up closely.  Patient was instructed to drink plenty of liquids when she eats and  eat small bites.  She was given GI follow-up.  After history, exam, and medical workup I feel the patient has been appropriately medically screened and is safe for discharge home. Pertinent diagnoses were discussed with the patient. Patient was given return precautions.   Final Clinical Impressions(s) / ED Diagnoses   Final diagnoses:  Odynophagia  Skin ulcer of right ankle, limited to breakdown of skin (HCC)  Cellulitis of right ankle    ED Discharge Orders        Ordered    doxycycline (VIBRAMYCIN) 100 MG capsule  2 times daily     09/11/17 0319       Bensyn Bornemann, Barbette Hair, MD 09/11/17 [phone]

## 2017-09-11 NOTE — ED Notes (Signed)
Pt. Able to tolerate water.

## 2017-09-15 DIAGNOSIS — E039 Hypothyroidism, unspecified: Secondary | ICD-10-CM | POA: Diagnosis not present

## 2017-09-15 DIAGNOSIS — E11622 Type 2 diabetes mellitus with other skin ulcer: Secondary | ICD-10-CM | POA: Diagnosis not present

## 2017-09-23 ENCOUNTER — Ambulatory Visit: Payer: Self-pay | Admitting: Hematology and Oncology

## 2017-09-25 DIAGNOSIS — L8990 Pressure ulcer of unspecified site, unspecified stage: Secondary | ICD-10-CM | POA: Diagnosis not present

## 2017-09-27 ENCOUNTER — Encounter (HOSPITAL_COMMUNITY): Payer: Self-pay | Admitting: Emergency Medicine

## 2017-09-27 ENCOUNTER — Emergency Department (HOSPITAL_COMMUNITY)
Admission: EM | Admit: 2017-09-27 | Discharge: 2017-09-27 | Disposition: A | Discharge status: Home / Self Care | Payer: Medicare Other | Attending: Emergency Medicine | Admitting: Emergency Medicine

## 2017-09-27 DIAGNOSIS — Z950 Presence of cardiac pacemaker: Secondary | ICD-10-CM | POA: Insufficient documentation

## 2017-09-27 DIAGNOSIS — Z79899 Other long term (current) drug therapy: Secondary | ICD-10-CM | POA: Insufficient documentation

## 2017-09-27 DIAGNOSIS — Z5189 Encounter for other specified aftercare: Secondary | ICD-10-CM

## 2017-09-27 DIAGNOSIS — Z853 Personal history of malignant neoplasm of breast: Secondary | ICD-10-CM | POA: Diagnosis not present

## 2017-09-27 DIAGNOSIS — M25571 Pain in right ankle and joints of right foot: Secondary | ICD-10-CM | POA: Diagnosis not present

## 2017-09-27 DIAGNOSIS — Z760 Encounter for issue of repeat prescription: Secondary | ICD-10-CM | POA: Diagnosis not present

## 2017-09-27 NOTE — ED Provider Notes (Signed)
[name] Provider Note   CSN: [id number] Arrival date & time: 09/27/17  1252     History   Chief Complaint Chief Complaint  Patient presents with  . Medication Refill    HPI [name] is a 84 y.o. female.  The history is provided by the patient and medical records. No language interpreter was used.  Medication Refill   [name] is a 84 y.o. female  with a PMH as listed below who presents to the Emergency Department requesting refill for doxycycline.  Patient states that she has a wound to her lateral right ankle.  This is been there for quite some time.  She was seen in the emergency department about 2 weeks ago where she was started on doxycycline as she felt the area was more swollen.  She took this until completion and felt as if it helped.  Once she stopped taking the medicine, she felt as if the ankle was starting to swell a little more.  She saw her primary doctor managing this (Dr. [name]) 2 days ago who told her to watch and wait.  He did not feel like she needed more antibiotics at that time.  She came to the emergency department today, because she still feels as if she needs further antibiotic care to get rid of the wound.  She denies any fever or chills.  Denies any drainage from the area.  Past Medical History:  Diagnosis Date  . Cataract   . Complete heart block (HCC)    s/p PPM implant (MDT) by Dr Blanch Media.  Atrial lead could not be paced at time of the procedure.  She has chronic AV dysociation  . Delusional disorder ([place])   . Exogenous obesity   . Hypercholesterolemia   . Invasive ductal carcinoma of breast ([place]) 03/2007   BILATERAL BREASTS  . Orthostatic hypotension    treated with midodrine by Dr Rollene [name]  . PONV (postoperative nausea and vomiting)   . Thyroid disease   . Venous insufficiency     Patient Active Problem List   Diagnosis Date Noted  . Thyroid disease   . PONV (postoperative nausea and  vomiting)   . Orthostatic hypotension   . Hypercholesterolemia   . Exogenous obesity   . Rectal bleeding 03/30/2017  . GI bleed 03/28/2017  . BRBPR (bright red blood per rectum) 03/27/2017  . Sigmoid diverticulitis 10/14/2016  . Diverticulitis 10/14/2016  . Varicose veins of bilateral lower extremities with other complications 57/84/6962  . Venous insufficiency 08/25/2015  . Cellulitis 08/09/2015  . Pressure ulcer 08/09/2015  . Delusional disorder ([place]) 07/05/2015  . Bereavement reaction 07/05/2015  . Pacemaker at end of battery life 02/23/2015  . CHB (complete heart block) ([place])   . Acute GI bleeding 02/15/2015  . Acute kidney injury (nontraumatic) ([place]) 02/15/2015  . Hypothyroidism 02/15/2015  . Left upper quadrant pain   . Osteopenia 12/22/2013  . Postural dizziness 04/05/2013  . Complete heart block ([place]) 02/26/2013  . S/P placement of cardiac pacemaker 02/26/2013  . Bilateral breast cancer ([place]) 03/25/2011  . Invasive ductal carcinoma of breast ([place]) 03/30/2007    Past Surgical History:  Procedure Laterality Date  . ABDOMINAL HYSTERECTOMY    . BREAST LUMPECTOMY Bilateral 2009  . CATARACT EXTRACTION     EYE SURGERY X 2  . CHOLECYSTECTOMY    . COLONOSCOPY N/A 02/16/2015   Procedure: COLONOSCOPY;  Surgeon: Wilford Corner, MD;  Location[facility] ENDOSCOPY;  Service: Endoscopy;  Laterality: N/A;  . EP IMPLANTABLE DEVICE N/A 02/23/2015   pacemaker generator change (MDT Sensia SR) by Dr Rayann Heman   . ESOPHAGOGASTRODUODENOSCOPY N/A 02/16/2015   Procedure: ESOPHAGOGASTRODUODENOSCOPY (EGD);  Surgeon: Wilford Corner, MD;  Location: [facility] ENDOSCOPY;  Service: Endoscopy;  Laterality: N/A;  . PACEMAKER INSERTION     MDT implanted by Dr Blanch Media.  Atrial lead placement was unsuccessful.  She has chronic AV dysociation  . remote right radical nephrectomy  2009     OB History   None      Home Medications    Prior to Admission medications   Medication Sig Start Date End Date  Taking? Authorizing Provider  Calcium Carbonate-Vitamin D (CALTRATE 600+D PO) Take 600 mg by mouth daily after breakfast.    [provider]  clotrimazole-betamethasone (LOTRISONE) cream APPLY TO AFFECTED AREA TWICE A DAY 06/03/17   [provider]  doxycycline (VIBRAMYCIN) 100 MG capsule Take 1 capsule (100 mg total) by mouth 2 (two) times daily. 09/11/17   Horton, Barbette Hair, MD  furosemide (LASIX) 20 MG tablet Take 10 mg by mouth daily.     [provider]  gentamicin cream (GARAMYCIN) 0.1 % Apply 1 application topically 3 (three) times daily. 09/08/17   Edrick Kins, DPM  latanoprost (XALATAN) 0.005 % ophthalmic solution USE 1 DROP INTO BOTH EYES AT BEDTIME 07/03/17   [provider]  letrozole (FEMARA) 2.5 MG tablet Take 1 tablet (2.5 mg total) by mouth daily. Patient taking differently: Take 2.5 mg by mouth daily with breakfast.  09/19/16   [name], MD  levothyroxine (SYNTHROID, LEVOTHROID) 50 MCG tablet Take 50 mcg by mouth daily before breakfast.     [provider]  lipase/protease/amylase (CREON) 36000 UNITS CPEP capsule Take 36,000 Units by mouth 3 (three) times daily before meals.    [provider]  midodrine (PROAMATINE) 2.5 MG tablet Take 2.5 mg by mouth daily after breakfast.     [provider]    Family History Family History  Problem Relation Age of Onset  . Heart disease Maternal Grandmother   . Heart disease Mother     Social History Social History   Tobacco Use  . Smoking status: Never Smoker  . Smokeless tobacco: Never Used  Substance Use Topics  . Alcohol use: No    Alcohol/week: 0.0 oz  . Drug use: No     Allergies   Amoxicillin-pot clavulanate; Erythromycin; Tape; Codeine; Flagyl [metronidazole]; Macrobid [nitrofurantoin macrocrystal]; Tolterodine; and Ciprofloxacin   Review of Systems Review of Systems  Constitutional: Negative for chills and fever.  Skin: Positive for wound.    Neurological: Negative for weakness and numbness.     Physical Exam Updated Vital Signs BP (!) 156/88 (BP Location: Right Arm)   Pulse 90   Temp 98.2 F (36.8 C) (Oral)   Resp 20   SpO2 97%   Physical Exam  Constitutional: She appears well-developed and well-nourished. No distress.  HENT:  Head: Normocephalic and atraumatic.  Neck: Neck supple.  Cardiovascular: Normal rate, regular rhythm and normal heart sounds.  No murmur heard. Pulmonary/Chest: Effort normal and breath sounds normal. No respiratory distress. She has no wheezes. She has no rales.  Musculoskeletal: Normal range of motion.  Neurological: She is alert.  Skin: Skin is warm and dry.  3 cm circular ulcerative lesion over the lateral aspect of the right ankle, granular base, no purulence noted. No streaking. Very minimal erythema around circumference. No warmth.   Nursing note and vitals  reviewed.    ED Treatments / Results  Labs (all labs ordered are listed, but only abnormal results are displayed) Labs Reviewed - No data to display  EKG None  Radiology No results found.  Procedures Procedures (including critical care time)  Medications Ordered in ED Medications - No data to display   Initial Impression / Assessment and Plan / ED Course  I have reviewed the triage vital signs and the nursing notes.  Pertinent labs & imaging results that were available during my care of the patient were reviewed by me and considered in my medical decision making (see chart for details).    SHAUNTAY BRUNELLI is a 84 y.o. female who presents to ED for concerns of wound to right lateral ankle.  Wound has been present for over a year and currently followed by Dr. [name].  She was seen in the emergency department on 5/16 due to increased swelling around the area.  Per chart review, there was some mild erythema to the area and so she was placed on doxycycline.  She completed her course of antibiotics with improvement.  She  then was seen as an outpatient 2 days ago by her doc who did not feel like further antibiotic treatment was warranted.  Her exam today is reassuring and looks much more chronic in nature.  She is afebrile.  There is no streaking or warmth to the touch.  No purulent drainage.  She has an appointment in 2 days with her doctor again for recheck.  We will have her continue topical antibiotic ointment and keep her scheduled appointment.  Patient seen by and discussed with Dr. Maryan Rued who agrees with treatment plan.    Final Clinical Impressions(s) / ED Diagnoses   Final diagnoses:  Visit for wound check    ED Discharge Orders    None       [place] Festa, Ozella Almond, PA-C 09/27/17 1527    Blanchie Dessert, MD 09/28/17 2155

## 2017-09-27 NOTE — ED Notes (Signed)
Pt discharged from ED; instructions provided; Pt encouraged to return to ED if symptoms worsen and to f/u with PCP; Pt verbalized understanding of all instructions 

## 2017-09-27 NOTE — Discharge Instructions (Signed)
Follow up with Dr. [name].   Return to ER for fever, new or worsening symptoms, any additional concerns.

## 2017-09-27 NOTE — ED Triage Notes (Signed)
Patient to ED requesting a refill of doxycycline - reports being seen here earlier for chronic ankle ulcer and given antibiotic. Patient saw her PCP on Thursday and was told to just keep taking the antibiotic and monitoring it. Patient denies any pain, no fevers/chills. Ambulatory with steady gait. Reports swelling comes and goes bilateral lower legs.

## 2017-09-29 ENCOUNTER — Ambulatory Visit (INDEPENDENT_AMBULATORY_CARE_PROVIDER_SITE_OTHER): Payer: Medicare Other | Admitting: Podiatry

## 2017-09-29 ENCOUNTER — Encounter: Payer: Self-pay | Admitting: Podiatry

## 2017-09-29 DIAGNOSIS — L97312 Non-pressure chronic ulcer of right ankle with fat layer exposed: Secondary | ICD-10-CM

## 2017-09-29 DIAGNOSIS — I83015 Varicose veins of right lower extremity with ulcer other part of foot: Secondary | ICD-10-CM

## 2017-09-29 DIAGNOSIS — L97519 Non-pressure chronic ulcer of other part of right foot with unspecified severity: Secondary | ICD-10-CM | POA: Diagnosis not present

## 2017-09-30 ENCOUNTER — Telehealth: Payer: Self-pay | Admitting: Internal Medicine

## 2017-09-30 NOTE — Telephone Encounter (Signed)
Returned call to Pt.  Pt would like a new PCP.  (Wants Dr. Rayann Heman to be her PCP).  Advised Dr. Rayann Heman may be able to refer her to someone.   Pt states she would like that, she needs a change.  Would not mind a young doctor just getting started. Will ask Dr. Rayann Heman.

## 2017-09-30 NOTE — Telephone Encounter (Signed)
New Message:        Pt would like to know if Dr. Rayann Heman can just be her primary Dr. And take care of all of her other needs. Pt states if he can't can he point her in the right direction of a young doctor that has just started here in [place].

## 2017-10-01 NOTE — Progress Notes (Signed)
   Subjective:  84 year old female presents today for follow up evaluation of an ulceration located on the right ankle. She states the wound has worsened. She states she was seen in the ED and was told it was infected and was prescribed antibiotics. Patient is here for further evaluation and treatment.   Past Medical History:  Diagnosis Date  . Cataract   . Complete heart block (HCC)    s/p PPM implant (MDT) by Dr Blanch Media.  Atrial lead could not be paced at time of the procedure.  She has chronic AV dysociation  . Delusional disorder ([place])   . Exogenous obesity   . Hypercholesterolemia   . Invasive ductal carcinoma of breast ([place]) 03/2007   BILATERAL BREASTS  . Orthostatic hypotension    treated with midodrine by Dr Rollene [name]  . PONV (postoperative nausea and vomiting)   . Thyroid disease   . Venous insufficiency      Objective/Physical Exam General: The patient is alert and oriented x3 in no acute distress.  Dermatology:  Wound #1 noted to the lateral right ankle measuring 1.3 x 1.3 x 0.2 cm (LxWxD).   To the noted ulceration(s), there is no eschar. There is a moderate amount of slough, fibrin, and necrotic tissue noted. Granulation tissue and wound base is red. There is a minimal amount of serosanguineous drainage noted. There is no exposed bone muscle-tendon ligament or joint. There is no malodor. Periwound integrity is intact. Skin is warm, dry and supple bilateral lower extremities.  Vascular: Palpable pedal pulses bilaterally. Mild edema noted. Capillary refill within normal limits. Varicosities noted bilateral lower extremities.   Neurological: Epicritic and protective threshold absent bilaterally.   Musculoskeletal Exam: Range of motion within normal limits to all pedal and ankle joints bilateral. Muscle strength 5/5 in all groups bilateral.    Assessment: #1 ulceration to the right lateral ankle secondary to venous insufficiency #2 varicosities bilateral lower  extremities   Plan of Care:  #1 Patient was evaluated. #2 medically necessary excisional debridement including subcutaneous tissue was performed using a tissue nipper and a chisel blade. Excisional debridement of all the necrotic nonviable tissue down to healthy bleeding viable tissue was performed with post-debridement measurements same as pre-. #3 the wound was cleansed with normal saline. #4 Silvadene cream provided to patient to use daily with a Band-Aid.  #5 Return to clinic in 3 weeks.     Edrick Kins, DPM [facility]  Dr. Edrick Kins, [place] [place]                                        [place], [place] [address]                Office [phone]  Fax [phone]

## 2017-10-02 NOTE — Telephone Encounter (Signed)
Follow up   Patient is calling back in reference to the PCP recommendations from Dr. Rayann Heman.

## 2017-10-13 DIAGNOSIS — Z961 Presence of intraocular lens: Secondary | ICD-10-CM | POA: Diagnosis not present

## 2017-10-13 DIAGNOSIS — H40013 Open angle with borderline findings, low risk, bilateral: Secondary | ICD-10-CM | POA: Diagnosis not present

## 2017-10-13 DIAGNOSIS — H43811 Vitreous degeneration, right eye: Secondary | ICD-10-CM | POA: Diagnosis not present

## 2017-10-13 DIAGNOSIS — H401132 Primary open-angle glaucoma, bilateral, moderate stage: Secondary | ICD-10-CM | POA: Diagnosis not present

## 2017-10-20 ENCOUNTER — Ambulatory Visit (INDEPENDENT_AMBULATORY_CARE_PROVIDER_SITE_OTHER): Payer: Medicare Other | Admitting: Podiatry

## 2017-10-20 ENCOUNTER — Other Ambulatory Visit: Payer: Self-pay | Admitting: Podiatry

## 2017-10-20 DIAGNOSIS — L97312 Non-pressure chronic ulcer of right ankle with fat layer exposed: Secondary | ICD-10-CM

## 2017-10-20 DIAGNOSIS — L97512 Non-pressure chronic ulcer of other part of right foot with fat layer exposed: Secondary | ICD-10-CM | POA: Diagnosis not present

## 2017-10-20 DIAGNOSIS — I83015 Varicose veins of right lower extremity with ulcer other part of foot: Secondary | ICD-10-CM | POA: Diagnosis not present

## 2017-10-20 DIAGNOSIS — L97519 Non-pressure chronic ulcer of other part of right foot with unspecified severity: Secondary | ICD-10-CM

## 2017-10-20 MED ORDER — SULFAMETHOXAZOLE-TRIMETHOPRIM 800-160 MG PO TABS: 1.0000 | ORAL_TABLET | Freq: Two times a day (BID) | ORAL | 0 refills | Status: DC

## 2017-10-21 ENCOUNTER — Ambulatory Visit: Payer: Self-pay | Admitting: Family Medicine

## 2017-10-22 ENCOUNTER — Ambulatory Visit (INDEPENDENT_AMBULATORY_CARE_PROVIDER_SITE_OTHER): Payer: Medicare Other | Admitting: Family Medicine

## 2017-10-22 ENCOUNTER — Encounter: Payer: Self-pay | Admitting: Family Medicine

## 2017-10-22 VITALS — BP 120/68 | HR 70 | Temp 98.2°F | Resp 16 | Ht 67.0 in | Wt 207.5 lb

## 2017-10-22 DIAGNOSIS — F419 Anxiety disorder, unspecified: Secondary | ICD-10-CM | POA: Insufficient documentation

## 2017-10-22 DIAGNOSIS — E039 Hypothyroidism, unspecified: Secondary | ICD-10-CM

## 2017-10-22 DIAGNOSIS — L97912 Non-pressure chronic ulcer of unspecified part of right lower leg with fat layer exposed: Secondary | ICD-10-CM

## 2017-10-22 DIAGNOSIS — I442 Atrioventricular block, complete: Secondary | ICD-10-CM | POA: Diagnosis not present

## 2017-10-22 DIAGNOSIS — L97909 Non-pressure chronic ulcer of unspecified part of unspecified lower leg with unspecified severity: Secondary | ICD-10-CM | POA: Insufficient documentation

## 2017-10-22 DIAGNOSIS — I83893 Varicose veins of bilateral lower extremities with other complications: Secondary | ICD-10-CM

## 2017-10-22 IMAGING — CT CT HEAD W/O CM
2 series · 15 of 30 positions shown, 19 images · non-contrast
Comparison: None.

CLINICAL DATA: 87-year-old with altered mental status. History of
breast cancer. Psychiatric evaluation.

EXAM:
CT HEAD WITHOUT CONTRAST
TECHNIQUE: Contiguous axial images were obtained from the base of the skull
through the vertex without intravenous contrast.

[Series 2: bone windows · axial · 0.45mm/px · z∈[-102,-82]mm · 2 of 31 slices shown]
[im 3/31  bone]
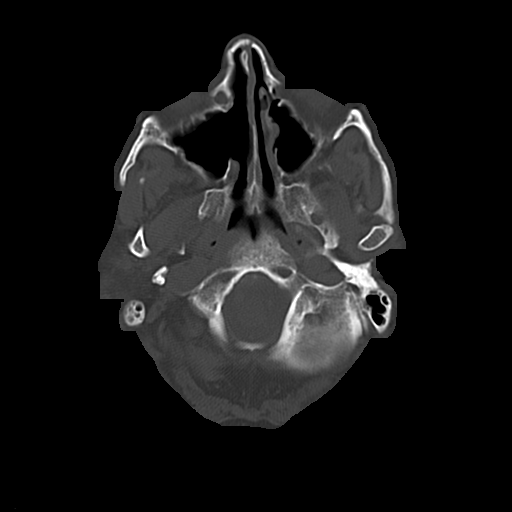
[im 7/31  bone]
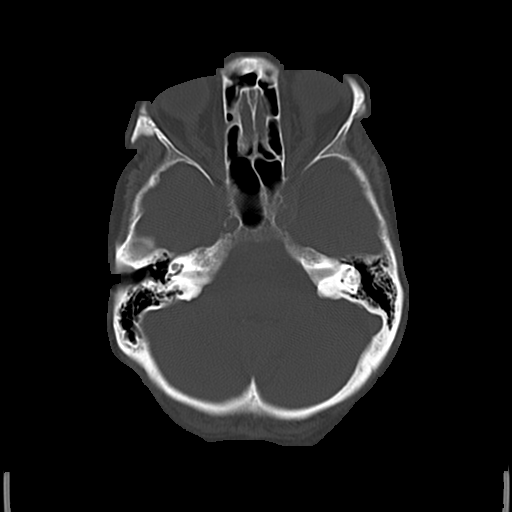

[Series 3: head w/o · axial · non-contrast · 0.45mm/px · z∈[-102,+23]mm · 13 of 31 slices shown, 17 images]
[im 3/31  brain]
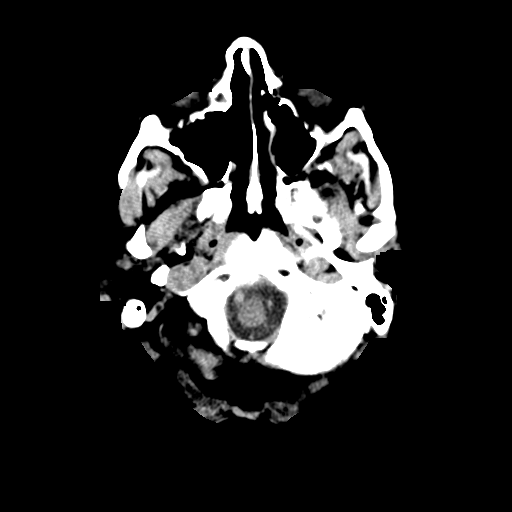
[im 3/31  bone]
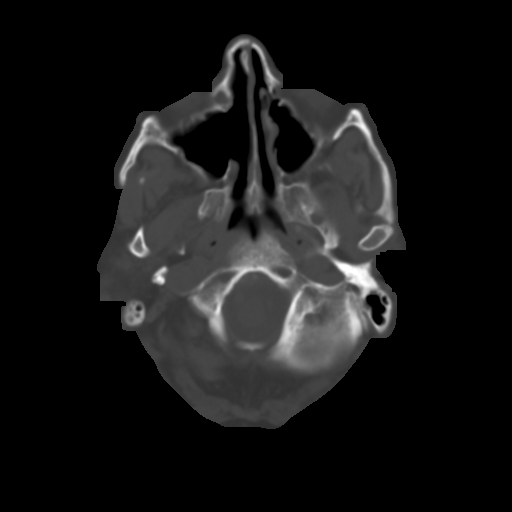
[im 5/31  brain]
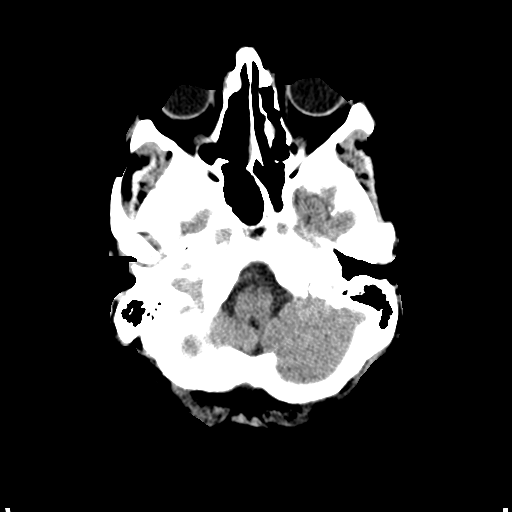
[im 7/31  brain]
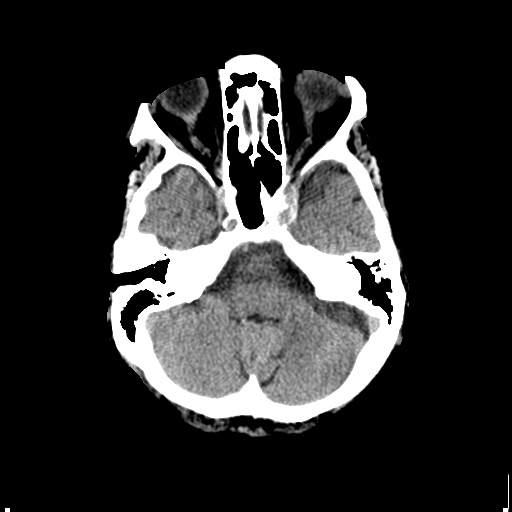
[im 9/31  brain]
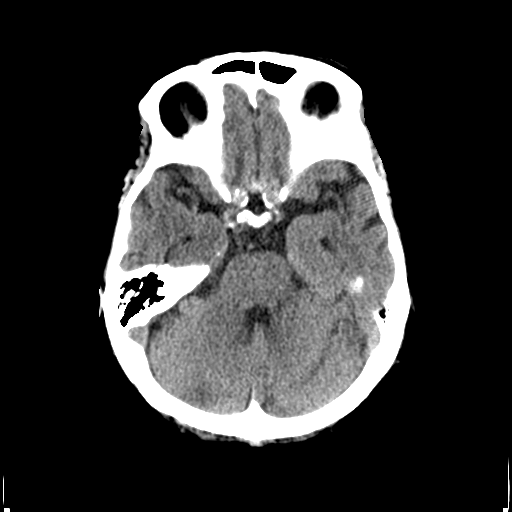
[im 11/31  brain]
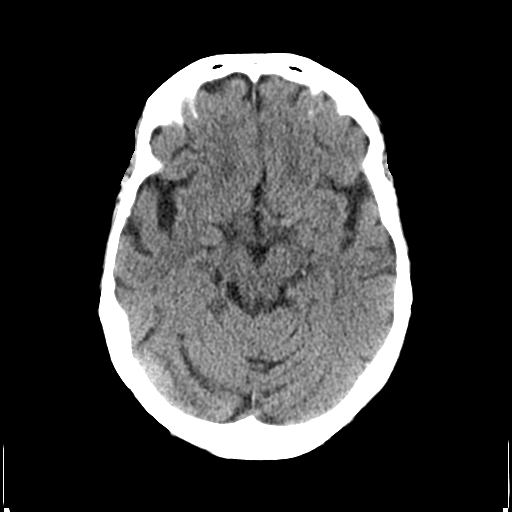
[im 11/31  bone]
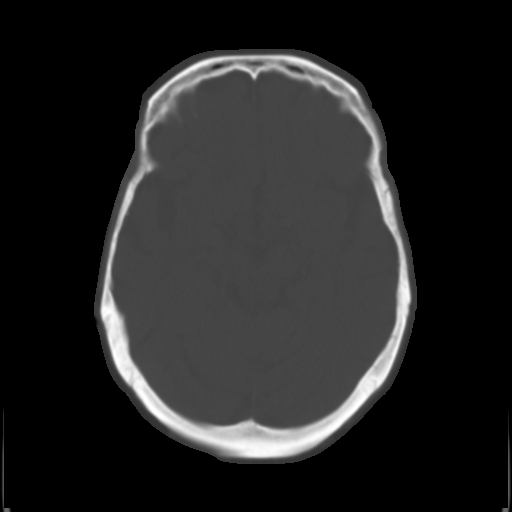
[im 13/31  brain]
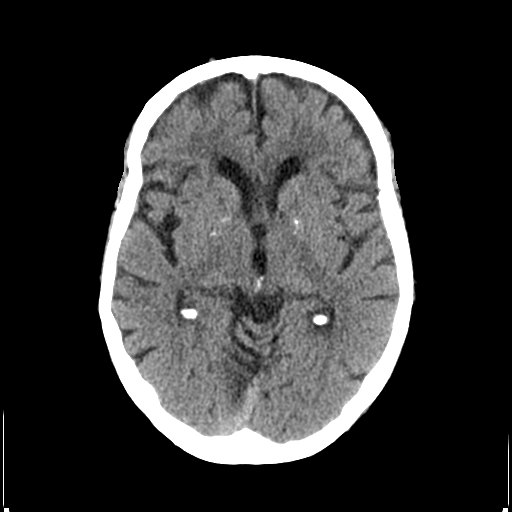
[im 16/31  brain]
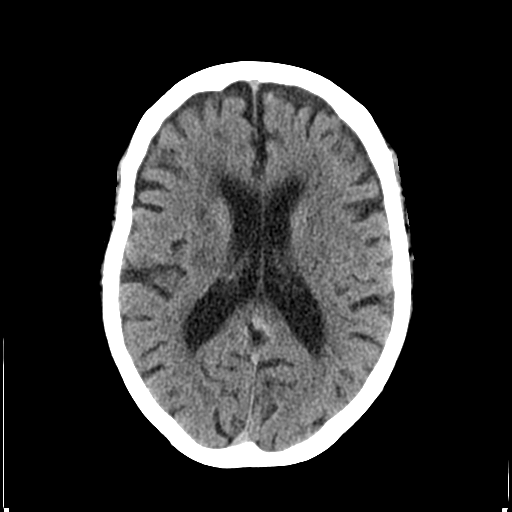
[im 18/31  brain]
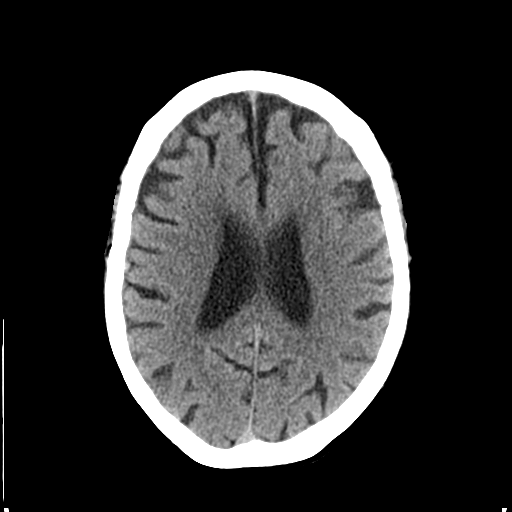
[im 20/31  brain]
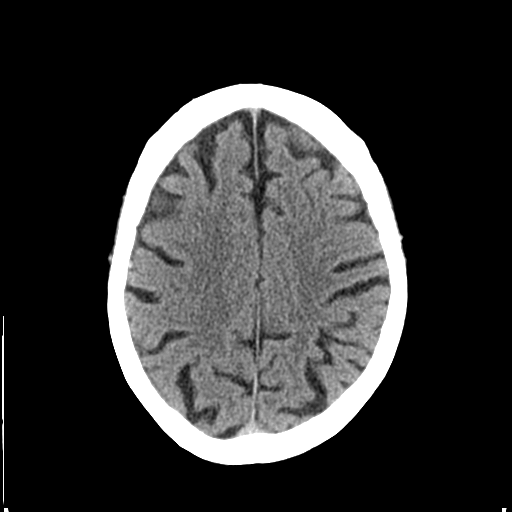
[im 20/31  bone]
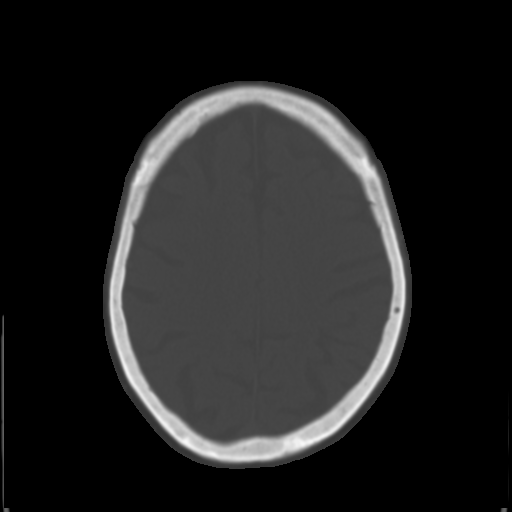
[im 22/31  brain]
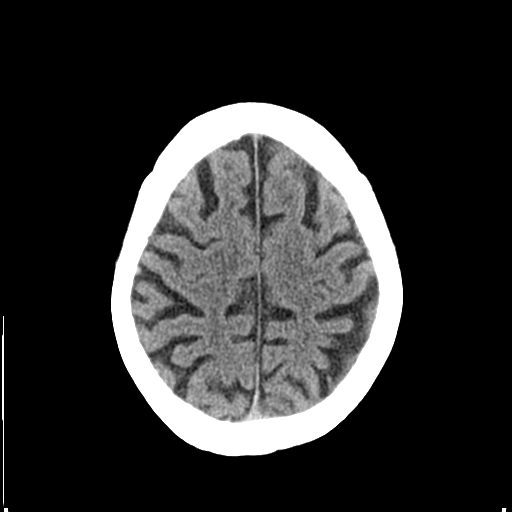
[im 24/31  brain]
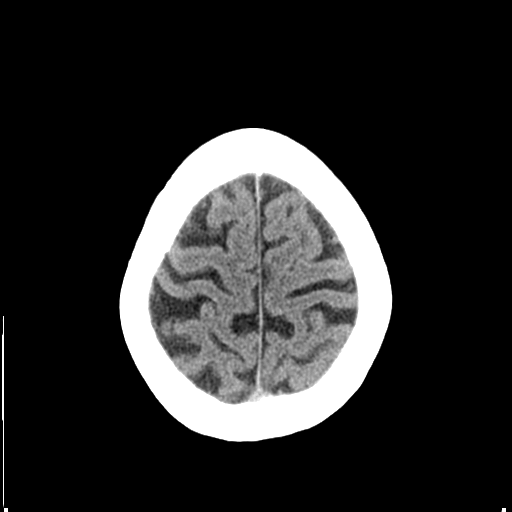
[im 26/31  brain]
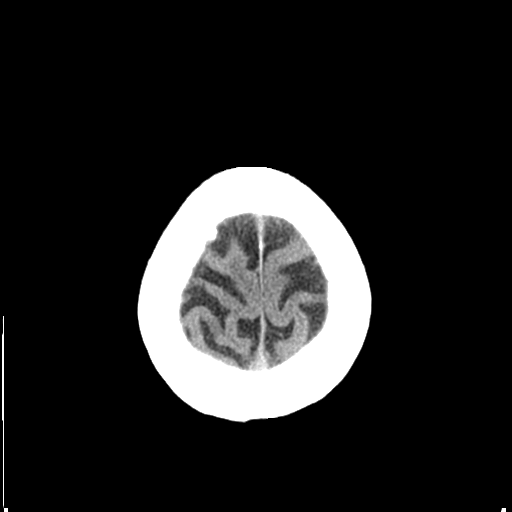
[im 28/31  brain]
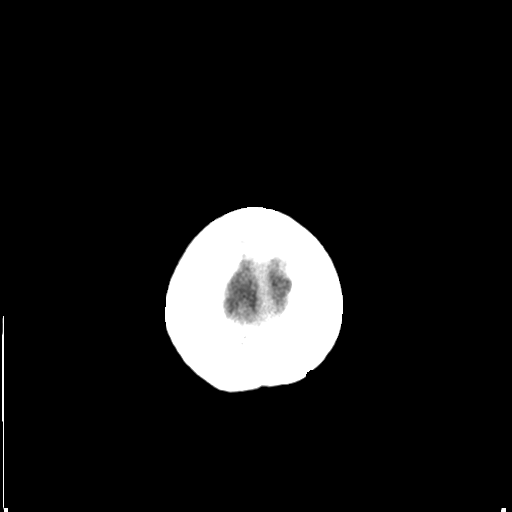
[im 28/31  bone]
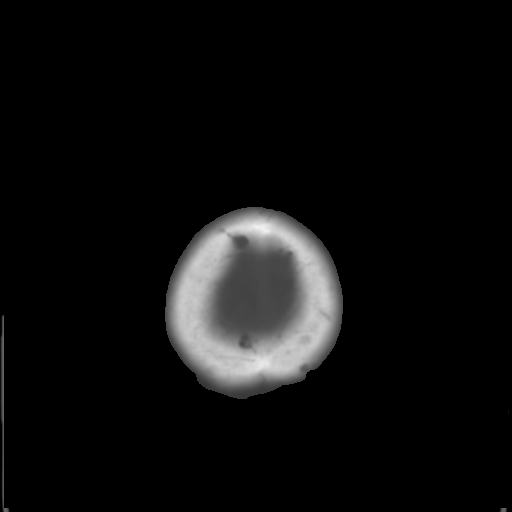

[15 of 30 positions shown; findings below may reference images not displayed]

FINDINGS: Brain: There is no evidence of acute intracranial hemorrhage, mass
lesion, brain edema or extra-axial fluid collection. The ventricles
and subarachnoid spaces are mildly prominent, consistent with mild
atrophy. There is no CT evidence of acute cortical infarction. There
are mild chronic small vessel ischemic changes in the
periventricular white matter and basal ganglia bilaterally.
Intracranial vascular calcifications are noted.

Bones/sinuses/visualized face: There are postsurgical changes status
post medial antrostomy and partial ethmoidectomy on the right. The
visualized paranasal sinuses, mastoid air cells and middle ears are
otherwise clear. The calvarium is intact.
IMPRESSION: 1. No acute intracranial findings demonstrated.
2. Mild atrophy and chronic small vessel ischemic changes.
3. Postsurgical changes in the paranasal sinuses.

## 2017-10-22 NOTE — Patient Instructions (Signed)
A few things to remember from today's visit:   Varicose veins of bilateral lower extremities with other complications  Hypothyroidism, unspecified type  Since you are following with our doctor and podiatrist thing I can see you back in May 2020.  Please arrange appointment before if you have any problem.  Please be sure medication list is accurate. If a new problem present, please set up appointment sooner than planned today.

## 2017-10-22 NOTE — Progress Notes (Signed)
HPI:   [name] is a 84 y.o. female, who is here today to establish care.  Former PCP: Dr. [name].    Chronic medical problems: Hypothyroidism, history of renal cancer status post left nephrectomy, bilateral breast cancer s/p bilateral lumpectomies, diverticulosis, osteopenia, complete heart block status post pacemaker placement, and venous stasis dermatitis among some. History of lower GI bleed, last colonoscopy 01/2015. She follows periodically with cardiologist and urologist (Dr Burden). She is  following with oncologist, last visit in 08/2016.  She has annual follow-ups, she is still taking Femara.  She lives alone, she drives short distance, she has not had a fall in the past year.   She denies history of depression. Upon reviewing records she has been admitted to behavioral health on 05/05/2016 because of paranoia and delusional thoughts.   Currently she is not taking medication and she is not following with psychiatrist.  Her husband died about 4 years ago.  Hypothyroidism: She is currently on levothyroxine 50 mcg daily. Last TSH on 08/28/2017 was normal at 3.0. She is tolerating medication well. She has not noted cold/heat intolerance, changes in bowel habits, or palpitations.  Orthostatic hypotension, currently she is on midodrine 2.5 mg daily. She has mild lightheadedness if she gets up fast.  Venous stasis dermatitis: She is on furosemide 10 mg daily. Lower extremity edema is worse at the end of the day after prolonged standing, walking.  It seems to be alleviated by lower extremity elevation, it is better first thing in the morning when she first gets up.  She is following with podiatrist for venous ulcer, right ankle.  She states that she has had this wound for years, it gets better with antibiotics but it does not resolve.  Currently she is on oral antibiotics: Bactrim DS twice daily.  She has a follow-up appointment in 3 days.  No history of  diabetes. Hemoglobin A1c done in 08/2017 was 6.1.  Last lipid panel on 08/28/2017: HDL 75, LDL 94, TC 185, TG 81.   Review of Systems  Constitutional: Negative for activity change, appetite change, fatigue and fever.  HENT: Negative for mouth sores, nosebleeds and trouble swallowing.   Eyes: Negative for redness and visual disturbance.  Respiratory: Negative for cough, shortness of breath and wheezing.   Cardiovascular: Positive for leg swelling. Negative for chest pain and palpitations.  Gastrointestinal: Negative for abdominal pain, nausea and vomiting.       Negative for changes in bowel habits.  Endocrine: Negative for cold intolerance and heat intolerance.  Genitourinary: Negative for decreased urine volume, dysuria and hematuria.  Musculoskeletal: Positive for gait problem. Negative for myalgias.  Skin: Positive for wound.  Neurological: Negative for syncope, weakness and headaches.  Psychiatric/Behavioral: Negative for confusion. The patient is nervous/anxious.       Current Outpatient Medications on File Prior to Visit  Medication Sig Dispense Refill  . Calcium Carbonate-Vitamin D (CALTRATE 600+D PO) Take 600 mg by mouth daily after breakfast.    . clotrimazole-betamethasone (LOTRISONE) cream APPLY TO AFFECTED AREA TWICE A DAY  1  . furosemide (LASIX) 20 MG tablet Take 10 mg by mouth daily.     Marland Kitchen gentamicin cream (GARAMYCIN) 0.1 % Apply 1 application topically 3 (three) times daily. 30 g 1  . latanoprost (XALATAN) 0.005 % ophthalmic solution USE 1 DROP INTO BOTH EYES AT BEDTIME  10  . letrozole (FEMARA) 2.5 MG tablet Take 1 tablet (2.5 mg total) by mouth daily. (Patient taking  differently: Take 2.5 mg by mouth daily with breakfast. ) 90 tablet 3  . levothyroxine (SYNTHROID, LEVOTHROID) 50 MCG tablet Take 50 mcg by mouth daily before breakfast.     . lipase/protease/amylase (CREON) 36000 UNITS CPEP capsule Take 36,000 Units by mouth 3 (three) times daily before meals.    .  midodrine (PROAMATINE) 2.5 MG tablet Take 2.5 mg by mouth daily after breakfast.     . sulfamethoxazole-trimethoprim (BACTRIM DS,SEPTRA DS) 800-160 MG tablet Take 1 tablet by mouth 2 (two) times daily. 20 tablet 0   No current facility-administered medications on file prior to visit.      Past Medical History:  Diagnosis Date  . Cataract   . Complete heart block (HCC)    s/p PPM implant (MDT) by Dr Blanch Media.  Atrial lead could not be paced at time of the procedure.  She has chronic AV dysociation  . Delusional disorder ([place])   . Exogenous obesity   . Hypercholesterolemia   . Invasive ductal carcinoma of breast ([place]) 03/2007   BILATERAL BREASTS  . Orthostatic hypotension    treated with midodrine by Dr Rollene [name]  . PONV (postoperative nausea and vomiting)   . Thyroid disease   . Venous insufficiency    Allergies  Allergen Reactions  . Amoxicillin-Pot Clavulanate Diarrhea and Nausea And Vomiting  . Erythromycin Hives  . Tape Other (See Comments)    BAND AIDS-SKIN IRRITATION  . Codeine Nausea And Vomiting  . Flagyl [Metronidazole] Hives  . Macrobid [Nitrofurantoin Macrocrystal] Nausea And Vomiting  . Tolterodine Nausea And Vomiting  . Ciprofloxacin Hives and Rash    Family History  Problem Relation Age of Onset  . Heart disease Maternal Grandmother   . Heart disease Mother     Social History   Socioeconomic History  . Marital status: Widowed    Spouse name: Not on file  . Number of children: Not on file  . Years of education: Not on file  . Highest education level: Not on file  Occupational History  . Not on file  Social Needs  . Financial resource strain: Not on file  . Food insecurity:    Worry: Not on file    Inability: Not on file  . Transportation needs:    Medical: Not on file    Non-medical: Not on file  Tobacco Use  . Smoking status: Never Smoker  . Smokeless tobacco: Never Used  Substance and Sexual Activity  . Alcohol use: No    Alcohol/week: 0.0  oz  . Drug use: No  . Sexual activity: Not Currently  Lifestyle  . Physical activity:    Days per week: Not on file    Minutes per session: Not on file  . Stress: Not on file  Relationships  . Social connections:    Talks on phone: Not on file    Gets together: Not on file    Attends religious service: Not on file    Active member of club or organization: Not on file    Attends meetings of clubs or organizations: Not on file    Relationship status: Not on file  Other Topics Concern  . Not on file  Social History Narrative  . Not on file    Vitals:   10/22/17 1531  BP: 120/68  Pulse: 70  Resp: 16  Temp: 98.2 F (36.8 C)  SpO2: 96%    Body mass index is 32.5 kg/m.   Physical Exam  Nursing note and vitals reviewed. Constitutional:  She is oriented to person, place, and time. She appears well-developed. No distress.  HENT:  Head: Normocephalic and atraumatic.  Mouth/Throat: Oropharynx is clear and moist and mucous membranes are normal.  Eyes: Pupils are equal, round, and reactive to light. Conjunctivae are normal.  Cardiovascular: Normal rate and regular rhythm.  No murmur heard. Pulses:      Dorsalis pedis pulses are 2+ on the right side, and 2+ on the left side.  Respiratory: Effort normal and breath sounds normal. No respiratory distress.  GI: Soft. She exhibits no mass. There is no hepatomegaly. There is no tenderness.  Musculoskeletal: She exhibits edema (1+ pitting LE edema,bilateral).  Lymphadenopathy:    She has no cervical adenopathy.  Neurological: She is alert and oriented to person, place, and time. She has normal strength.  Mildly unstable gait assisted with a cane.  Skin: Skin is warm. Lesion and rash noted. Rash is macular. No erythema.  Pretibial mild erythema, bilateral.  No tenderness or local heat.1.5-2 cm. Right upper right lateral malleolus daily a ulcer.  There is no surrounding erythema or purulent drainage.  Psychiatric: Her mood appears  anxious.  Well groomed, good eye contact.      ASSESSMENT AND PLAN:   Ms. Sharifah was seen today for establish care.  Diagnoses and all orders for this visit:  Varicose veins of bilateral lower extremities with other complications  Lower extremity elevation above waist level a few times per day may help. She is not waiting compression stockings, it is difficult for her to put it on. No changes in furosemide.  Hypothyroidism, unspecified type  Recent TSH is helpful. No changes in current management. Follow-up in 08/2018.  Ulcer of right lower extremity with fat layer exposed ([place])  Chronic problem.  Currently she is on oral antibiotics and she is reporting improvement. She will continue following with Dr. [name], podiatrist.  Complete heart block [facility])  Status post cardiac pacemaker placement. Pacemaker check scheduled for this coming Monday. Next follow-up appointment with Dr. Peter Martinique in 04/2018.  Anxiety disorder, unspecified type  As well as history of other psychiatric disorders. She is not longer following with psychiatrist. For now I am not recommending pharmacologic treatment. We will continue following.      Kazuki Ingle G. Martinique, MD  [facility]. [place] office.

## 2017-10-23 NOTE — Progress Notes (Signed)
   Subjective:  84 year old female presents today for follow up evaluation of an ulceration located on the right ankle. She states the wound is improving. There are no modifying factors noted. She has not been doing anything for treatment at home. Patient is here for further evaluation and treatment.   Past Medical History:  Diagnosis Date  . Cataract   . Complete heart block (HCC)    s/p PPM implant (MDT) by Dr Blanch Media.  Atrial lead could not be paced at time of the procedure.  She has chronic AV dysociation  . Delusional disorder ([place])   . Exogenous obesity   . Hypercholesterolemia   . Invasive ductal carcinoma of breast ([place]) 03/2007   BILATERAL BREASTS  . Orthostatic hypotension    treated with midodrine by Dr Rollene [name]  . PONV (postoperative nausea and vomiting)   . Thyroid disease   . Venous insufficiency      Objective/Physical Exam General: The patient is alert and oriented x3 in no acute distress.  Dermatology:  Wound #1 noted to the lateral right ankle measuring 1.0 x 0.9 x 0.2 cm (LxWxD).   Wound #2 noted to the right plantar forefoot measuring 2.0 x 1.0 x 0.2 cm  To the noted ulceration(s), there is no eschar. There is a moderate amount of slough, fibrin, and necrotic tissue noted. Granulation tissue and wound base is red. There is a minimal amount of serosanguineous drainage noted. There is no exposed bone muscle-tendon ligament or joint. There is no malodor. Periwound integrity is intact. Skin is warm, dry and supple bilateral lower extremities.  Vascular: Palpable pedal pulses bilaterally. Mild edema noted. Capillary refill within normal limits. Varicosities noted bilateral lower extremities.   Neurological: Epicritic and protective threshold absent bilaterally.   Musculoskeletal Exam: Range of motion within normal limits to all pedal and ankle joints bilateral. Muscle strength 5/5 in all groups bilateral.    Assessment: #1 ulceration to the right lateral ankle  secondary to venous insufficiency #2 ulceration to the right plantar forefoot secondary to venous insufficiency  #3 varicosities bilateral lower extremities   Plan of Care:  #1 Patient was evaluated. #2 medically necessary excisional debridement including subcutaneous tissue was performed using a tissue nipper and a chisel blade. Excisional debridement of all the necrotic nonviable tissue down to healthy bleeding viable tissue was performed with post-debridement measurements same as pre-. #3 the wound was cleansed with normal saline. #4 Cultures taken from right plantar forefoot wound.  #5 Prescription for Bactrim DS provided to patient.  #6 Post op shoe dispensed.  #7 Return to clinic in 3 weeks.     Edrick Kins, DPM [facility]  Dr. Edrick Kins, [address]                                        [place], [place] [address]                Office [phone]  Fax [phone]

## 2017-10-24 LAB — WOUND CULTURE
MICRO NUMBER: 90752674
SPECIMEN QUALITY: ADEQUATE

## 2017-10-27 ENCOUNTER — Ambulatory Visit (INDEPENDENT_AMBULATORY_CARE_PROVIDER_SITE_OTHER): Payer: Medicare Other | Admitting: *Deleted

## 2017-10-27 DIAGNOSIS — I442 Atrioventricular block, complete: Secondary | ICD-10-CM

## 2017-10-28 NOTE — Progress Notes (Signed)
Remote pacemaker transmission.   

## 2017-10-29 ENCOUNTER — Encounter: Payer: Self-pay | Admitting: Cardiology

## 2017-11-01 LAB — CUP PACEART REMOTE DEVICE CHECK
Battery Remaining Longevity: 72 mo
Date Time Interrogation Session: 20190701155928
Implantable Lead Implant Date: 20141208
Implantable Lead Model: 4076
Lead Channel Impedance Value: 0 Ohm
Lead Channel Impedance Value: 384 Ohm
Lead Channel Pacing Threshold Amplitude: 1.125 V
Lead Channel Setting Pacing Amplitude: 2.25 V
MDC IDC LEAD LOCATION: 753860
MDC IDC MSMT BATTERY IMPEDANCE: 681 Ohm
MDC IDC MSMT BATTERY VOLTAGE: 2.77 V
MDC IDC MSMT LEADCHNL RV PACING THRESHOLD PULSEWIDTH: 0.4 ms
MDC IDC PG IMPLANT DT: 20161027
MDC IDC SET LEADCHNL RV PACING PULSEWIDTH: 0.4 ms
MDC IDC SET LEADCHNL RV SENSING SENSITIVITY: 2 mV
MDC IDC STAT BRADY RV PERCENT PACED: 99 %

## 2017-11-10 ENCOUNTER — Ambulatory Visit (INDEPENDENT_AMBULATORY_CARE_PROVIDER_SITE_OTHER): Payer: Medicare Other | Admitting: Podiatry

## 2017-11-10 DIAGNOSIS — L97312 Non-pressure chronic ulcer of right ankle with fat layer exposed: Secondary | ICD-10-CM

## 2017-11-10 DIAGNOSIS — L97519 Non-pressure chronic ulcer of other part of right foot with unspecified severity: Secondary | ICD-10-CM

## 2017-11-10 DIAGNOSIS — I83015 Varicose veins of right lower extremity with ulcer other part of foot: Secondary | ICD-10-CM

## 2017-11-17 NOTE — Progress Notes (Signed)
   Subjective:  84 year old female presents today for follow up evaluation of ulcerations noted to the right plantar forefoot and right ankle. She states the wounds have improved. She has been using gentamicin cream daily with a bandage as directed. There are no modifying factors noted. Patient is here for further evaluation and treatment.   Past Medical History:  Diagnosis Date  . Cataract   . Complete heart block (HCC)    s/p PPM implant (MDT) by Dr Blanch Media.  Atrial lead could not be paced at time of the procedure.  She has chronic AV dysociation  . Delusional disorder ([place])   . Exogenous obesity   . Hypercholesterolemia   . Invasive ductal carcinoma of breast ([place]) 03/2007   BILATERAL BREASTS  . Orthostatic hypotension    treated with midodrine by Dr Rollene [name]  . PONV (postoperative nausea and vomiting)   . Thyroid disease   . Venous insufficiency      Objective/Physical Exam General: The patient is alert and oriented x3 in no acute distress.  Dermatology:  Wound #1 noted to the lateral right ankle measuring 0.4 x 0.3 x 0.1 cm (LxWxD).   To the noted ulceration(s), there is no eschar. There is a moderate amount of slough, fibrin, and necrotic tissue noted. Granulation tissue and wound base is red. There is a minimal amount of serosanguineous drainage noted. There is no exposed bone muscle-tendon ligament or joint. There is no malodor. Periwound integrity is intact. Skin is warm, dry and supple bilateral lower extremities.  Wound noted to the right plantar forefoot has healed. Complete re-epithelialization has occurred. No drainage noted.    Vascular: Palpable pedal pulses bilaterally. Mild edema noted. Capillary refill within normal limits. Varicosities noted bilateral lower extremities.   Neurological: Epicritic and protective threshold absent bilaterally.   Musculoskeletal Exam: Range of motion within normal limits to all pedal and ankle joints bilateral. Muscle strength 5/5  in all groups bilateral.    Assessment: #1 ulceration to the right lateral ankle secondary to venous insufficiency - improved  #2 ulceration to the right plantar forefoot secondary to venous insufficiency - healed  #3 varicosities bilateral lower extremities   Plan of Care:  #1 Patient was evaluated. #2 medically necessary excisional debridement including subcutaneous tissue was performed using a tissue nipper and a chisel blade. Excisional debridement of all the necrotic nonviable tissue down to healthy bleeding viable tissue was performed with post-debridement measurements same as pre-. #3 the wound was cleansed with normal saline. #4 Cultures reviewed and are negative for growth.  #5 Continue using gentamicin cream daily with a Band-Aid.  #6 Return to clinic in 4 weeks.     Edrick Kins, DPM [facility]  Dr. Edrick Kins, [address]                                        [place], [place] [address]                Office [phone]  Fax [phone]

## 2017-12-08 ENCOUNTER — Ambulatory Visit (INDEPENDENT_AMBULATORY_CARE_PROVIDER_SITE_OTHER): Payer: Medicare Other | Admitting: Podiatry

## 2017-12-08 DIAGNOSIS — L97519 Non-pressure chronic ulcer of other part of right foot with unspecified severity: Secondary | ICD-10-CM | POA: Diagnosis not present

## 2017-12-08 DIAGNOSIS — I83015 Varicose veins of right lower extremity with ulcer other part of foot: Secondary | ICD-10-CM | POA: Diagnosis not present

## 2017-12-08 DIAGNOSIS — L97312 Non-pressure chronic ulcer of right ankle with fat layer exposed: Secondary | ICD-10-CM | POA: Diagnosis not present

## 2017-12-08 DIAGNOSIS — L03116 Cellulitis of left lower limb: Secondary | ICD-10-CM

## 2017-12-08 MED ORDER — DOXYCYCLINE HYCLATE 100 MG PO TABS: 100.0000 mg | ORAL_TABLET | Freq: Two times a day (BID) | ORAL | 0 refills | Status: DC

## 2017-12-10 NOTE — Progress Notes (Signed)
   Subjective:  84 year old female presents today for follow up evaluation of ulcerations noted to the right plantar forefoot and right ankle. She states the wounds have not improved much. She now reports redness and swelling of the left lower leg. She has been applying Silvadene as directed. There are no modifying factors noted. Patient is here for further evaluation and treatment.   Past Medical History:  Diagnosis Date  . Cataract   . Complete heart block (HCC)    s/p PPM implant (MDT) by Dr Blanch Media.  Atrial lead could not be paced at time of the procedure.  She has chronic AV dysociation  . Delusional disorder ([place])   . Exogenous obesity   . Hypercholesterolemia   . Invasive ductal carcinoma of breast ([place]) 03/2007   BILATERAL BREASTS  . Orthostatic hypotension    treated with midodrine by Dr Rollene [name]  . PONV (postoperative nausea and vomiting)   . Thyroid disease   . Venous insufficiency      Objective/Physical Exam General: The patient is alert and oriented x3 in no acute distress.  Dermatology:  Wound #1 noted to the lateral right ankle measuring 0.4 x 0.5 x 0.2 cm (LxWxD).   To the noted ulceration(s), there is no eschar. There is a moderate amount of slough, fibrin, and necrotic tissue noted. Granulation tissue and wound base is red. There is a minimal amount of serosanguineous drainage noted. There is no exposed bone muscle-tendon ligament or joint. There is no malodor. Periwound integrity is intact. Skin is warm, dry and supple bilateral lower extremities.  Wound noted to the right plantar forefoot has healed. Complete re-epithelialization has occurred. No drainage noted.    Vascular: Palpable pedal pulses bilaterally. Erythema and warmth noted to the left leg. Capillary refill within normal limits. Varicosities noted bilateral lower extremities.   Neurological: Epicritic and protective threshold diminished bilaterally.   Musculoskeletal Exam: Range of motion within  normal limits to all pedal and ankle joints bilateral. Muscle strength 5/5 in all groups bilateral.    Assessment: #1 ulceration to the right lateral ankle secondary to venous insufficiency  #2 ulceration to the right plantar forefoot secondary to venous insufficiency - healed  #3 varicosities bilateral lower extremities #4 cellulitis left leg    Plan of Care:  #1 Patient was evaluated. #2 medically necessary excisional debridement including subcutaneous tissue was performed using a tissue nipper and a chisel blade. Excisional debridement of all the necrotic nonviable tissue down to healthy bleeding viable tissue was performed with post-debridement measurements same as pre-. #3 the wound was cleansed with normal saline. #4 Continue using Silvadene cream daily with a bandage.  #5 Prescription Doxycycline 100 mg #20 for left leg.  #6 Return to clinic in 4 weeks.      Edrick Kins, DPM [facility]  Dr. Edrick Kins, [address]                                        [place], [place] [address]                Office [phone]  Fax [phone]

## 2017-12-23 ENCOUNTER — Telehealth: Payer: Self-pay | Admitting: *Deleted

## 2017-12-23 DIAGNOSIS — Z23 Encounter for immunization: Secondary | ICD-10-CM | POA: Diagnosis not present

## 2017-12-23 MED ORDER — DOXYCYCLINE HYCLATE 100 MG PO TABS: 100.0000 mg | ORAL_TABLET | Freq: Two times a day (BID) | ORAL | 0 refills | Status: DC

## 2017-12-23 NOTE — Telephone Encounter (Signed)
Refill request for doxycycline. Dr. [name] states refill as previously.

## 2018-01-05 ENCOUNTER — Ambulatory Visit (INDEPENDENT_AMBULATORY_CARE_PROVIDER_SITE_OTHER): Payer: Medicare Other | Admitting: Podiatry

## 2018-01-05 DIAGNOSIS — L97512 Non-pressure chronic ulcer of other part of right foot with fat layer exposed: Secondary | ICD-10-CM | POA: Diagnosis not present

## 2018-01-05 DIAGNOSIS — M79676 Pain in unspecified toe(s): Secondary | ICD-10-CM

## 2018-01-05 DIAGNOSIS — I83015 Varicose veins of right lower extremity with ulcer other part of foot: Secondary | ICD-10-CM

## 2018-01-05 DIAGNOSIS — B351 Tinea unguium: Secondary | ICD-10-CM

## 2018-01-05 DIAGNOSIS — L97519 Non-pressure chronic ulcer of other part of right foot with unspecified severity: Secondary | ICD-10-CM | POA: Diagnosis not present

## 2018-01-05 DIAGNOSIS — L97312 Non-pressure chronic ulcer of right ankle with fat layer exposed: Secondary | ICD-10-CM

## 2018-01-06 NOTE — Progress Notes (Signed)
   Subjective:  84 year old [name] presents today for follow up evaluation of ulcerations noted to the right lower extremity. She states the wounds have not improved a lot. She has been using the Silvadene cream as directed. She also complains of elongated, thickened nails that cause pain while ambulating in shoes. She is unable to trim her own nails. Patient is here for further evaluation and treatment.   Past Medical History:  Diagnosis Date  . Cataract   . Complete heart block (HCC)    s/p PPM implant (MDT) by Dr Blanch Media.  Atrial lead could not be paced at time of the procedure.  She has chronic AV dysociation  . Delusional disorder ([place])   . Exogenous obesity   . Hypercholesterolemia   . Invasive ductal carcinoma of breast ([place]) 03/2007   BILATERAL BREASTS  . Orthostatic hypotension    treated with midodrine by Dr Rollene [name]  . PONV (postoperative nausea and vomiting)   . Thyroid disease   . Venous insufficiency      Objective/Physical Exam General: The patient is alert and oriented x3 in no acute distress.  Dermatology:  Wound #1 noted to the lateral right ankle measuring 2.0 x 2.0 x 0.2 cm (LxWxD).   Wound #2 noted tot he right fourth toe measuring 0.5 x 0.5 x 0.1 cm.  To the noted ulceration(s), there is no eschar. There is a moderate amount of slough, fibrin, and necrotic tissue noted. Granulation tissue and wound base is red. There is a minimal amount of serosanguineous drainage noted. There is no exposed bone muscle-tendon ligament or joint. There is no malodor. Periwound integrity is intact. Skin is warm, dry and supple bilateral lower extremities.  Vascular: Palpable pedal pulses bilaterally. Capillary refill within normal limits. Varicosities noted bilateral lower extremities.   Neurological: Epicritic and protective threshold diminished bilaterally.   Musculoskeletal Exam: Range of motion within normal limits to all pedal and ankle joints bilateral. Muscle strength  5/5 in all groups bilateral.    Assessment: #1 ulceration to the right lateral ankle secondary to venous insufficiency  #2 ulceration to the right fourth toe secondary to venous insufficiency  #3 varicosities bilateral lower extremities #4 cellulitis left leg - resolved #5 Diabetes Mellitus w/ peripheral neuropathy #6 Onychomycosis of nail due to dermatophyte bilateral    Plan of Care:  #1 Patient was evaluated. #2 medically necessary excisional debridement including subcutaneous tissue was performed using a tissue nipper and a chisel blade. Excisional debridement of all the necrotic nonviable tissue down to healthy bleeding viable tissue was performed with post-debridement measurements same as pre-. #3 the wound was cleansed with normal saline. #4 Continue using Silvadene cream daily with a bandage.  #5 Mechanical debridement of nails 1-5 bilaterally performed using a nail nipper. Filed with dremel without incident.  #6 Return to clinic in 4 weeks.      Nichole [name], DPM [facility]  Dr. Edrick [name], [address]                                        [place], [place] [address]                Office [phone]  Fax [phone]

## 2018-01-09 ENCOUNTER — Ambulatory Visit (INDEPENDENT_AMBULATORY_CARE_PROVIDER_SITE_OTHER): Payer: Medicare Other | Admitting: Family Medicine

## 2018-01-09 ENCOUNTER — Encounter: Payer: Self-pay | Admitting: Family Medicine

## 2018-01-09 VITALS — BP 124/82 | HR 87 | Temp 98.3°F | Resp 16 | Ht 67.0 in | Wt 213.4 lb

## 2018-01-09 DIAGNOSIS — F419 Anxiety disorder, unspecified: Secondary | ICD-10-CM

## 2018-01-09 DIAGNOSIS — I872 Venous insufficiency (chronic) (peripheral): Secondary | ICD-10-CM | POA: Diagnosis not present

## 2018-01-09 DIAGNOSIS — R197 Diarrhea, unspecified: Secondary | ICD-10-CM | POA: Diagnosis not present

## 2018-01-09 DIAGNOSIS — E039 Hypothyroidism, unspecified: Secondary | ICD-10-CM

## 2018-01-09 MED ORDER — TRIAMCINOLONE ACETONIDE 0.1 % EX OINT: 1.0000 "application " | TOPICAL_OINTMENT | Freq: Every day | CUTANEOUS | 1 refills | Status: DC | PRN

## 2018-01-09 NOTE — Assessment & Plan Note (Signed)
No changes in current management, will follow labs done today and will give further recommendations accordingly.  

## 2018-01-09 NOTE — Assessment & Plan Note (Signed)
It is not well controlled, some features of ? paranoia.  She is not suicidal/homicidal, I do not think she needs ER evaluation/psych hospitalization She has followed with psychiatrist in the past and she is not interested in establishing with a new one, she does not think she needs medication. I will see her back in 3 to 4 months. I gave her the phone number of DSS here in [place] , she is interested in calling and express her concerns.

## 2018-01-09 NOTE — Patient Instructions (Addendum)
A few things to remember from today's visit:   Venous stasis dermatitis of both lower extremities - Plan: triamcinolone ointment (KENALOG) 0.1 %  Diarrhea, unspecified type - Plan: CBC, Basic metabolic panel, Lipase  Anxiety disorder, unspecified type  Hypothyroidism, unspecified type - Plan: TSH    Increased dose of furosemide from 10 mg to 20 mg (whole tablet). No changes in thyroid medication.   Vein disease is a condition that can affect the veins in the legs. It can cause leg pain, varicose veins, swollen legs, or open sores. Varicose veins are swollen and twisted veins. Things that may help: leg exercises (ankle flexion, walking),compression stocking, OTC horse chestnut seed extract 300 mg twice daily, for itchy skin cortisone and moisturizers.  Compression stockings- [facility] in [place]   [phone] [place], Phone        Web results [place]    Please be sure medication list is accurate. If a new problem present, please set up appointment sooner than planned today.

## 2018-01-09 NOTE — Assessment & Plan Note (Addendum)
We discussed diagnosis, prognosis, and treatment options. Recommend increasing furosemide from 10 mg to 20 mg daily as needed.  We discussed some side effects of diuretics. Compression stockings and/or elevation of lower extremities above waist level. Appropriate skin care. Topical triamcinolone daily as needed. Instructed about warning signs.

## 2018-01-09 NOTE — Assessment & Plan Note (Signed)
Instructed to resume Creon TID with meals. Adequate hydration. Further recommendations will be given according to BMP results.
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
## 2018-01-10 LAB — CBC
HCT: 42.7 % (ref 35.0–45.0)
Hemoglobin: 14.3 g/dL (ref 11.7–15.5)
MCH: 31 pg (ref 27.0–33.0)
MCHC: 33.5 g/dL (ref 32.0–36.0)
MCV: 92.4 fL (ref 80.0–100.0)
MPV: 11.7 fL (ref 7.5–12.5)
PLATELETS: 239 10*3/uL (ref 140–400)
RBC: 4.62 10*6/uL (ref 3.80–5.10)
RDW: 12.7 % (ref 11.0–15.0)
WBC: 6.9 10*3/uL (ref 3.8–10.8)

## 2018-01-10 LAB — BASIC METABOLIC PANEL
BUN / CREAT RATIO: 16 (calc) (ref 6–22)
BUN: 18 mg/dL (ref 7–25)
CHLORIDE: 106 mmol/L (ref 98–110)
CO2: 28 mmol/L (ref 20–32)
CREATININE: 1.13 mg/dL — AB (ref 0.60–0.88)
Calcium: 9.5 mg/dL (ref 8.6–10.4)
Glucose, Bld: 77 mg/dL (ref 65–99)
POTASSIUM: 3.8 mmol/L (ref 3.5–5.3)
Sodium: 145 mmol/L (ref 135–146)

## 2018-01-10 LAB — TSH: TSH: 3.6 m[IU]/L (ref 0.40–4.50)

## 2018-01-10 LAB — LIPASE: Lipase: 18 U/L (ref 7–60)

## 2018-01-11 ENCOUNTER — Emergency Department (HOSPITAL_COMMUNITY): Payer: Medicare Other

## 2018-01-11 ENCOUNTER — Encounter (HOSPITAL_COMMUNITY): Payer: Self-pay | Admitting: Emergency Medicine

## 2018-01-11 ENCOUNTER — Emergency Department (HOSPITAL_BASED_OUTPATIENT_CLINIC_OR_DEPARTMENT_OTHER): Payer: Medicare Other

## 2018-01-11 ENCOUNTER — Emergency Department (HOSPITAL_COMMUNITY)
Admission: EM | Admit: 2018-01-11 | Discharge: 2018-01-11 | Disposition: A | Discharge status: Home / Self Care | Payer: Medicare Other | Attending: Emergency Medicine | Admitting: Emergency Medicine

## 2018-01-11 DIAGNOSIS — E7800 Pure hypercholesterolemia, unspecified: Secondary | ICD-10-CM | POA: Insufficient documentation

## 2018-01-11 DIAGNOSIS — R918 Other nonspecific abnormal finding of lung field: Secondary | ICD-10-CM | POA: Diagnosis not present

## 2018-01-11 DIAGNOSIS — M79609 Pain in unspecified limb: Secondary | ICD-10-CM | POA: Diagnosis not present

## 2018-01-11 DIAGNOSIS — R609 Edema, unspecified: Secondary | ICD-10-CM | POA: Diagnosis not present

## 2018-01-11 DIAGNOSIS — Z79899 Other long term (current) drug therapy: Secondary | ICD-10-CM | POA: Diagnosis not present

## 2018-01-11 DIAGNOSIS — R600 Localized edema: Secondary | ICD-10-CM | POA: Diagnosis not present

## 2018-01-11 DIAGNOSIS — M7989 Other specified soft tissue disorders: Secondary | ICD-10-CM | POA: Diagnosis not present

## 2018-01-11 DIAGNOSIS — I442 Atrioventricular block, complete: Secondary | ICD-10-CM | POA: Insufficient documentation

## 2018-01-11 DIAGNOSIS — R5381 Other malaise: Secondary | ICD-10-CM | POA: Diagnosis not present

## 2018-01-11 DIAGNOSIS — R2241 Localized swelling, mass and lump, right lower limb: Secondary | ICD-10-CM | POA: Diagnosis present

## 2018-01-11 DIAGNOSIS — R0602 Shortness of breath: Secondary | ICD-10-CM | POA: Diagnosis not present

## 2018-01-11 LAB — CBC WITH DIFFERENTIAL/PLATELET
ABS IMMATURE GRANULOCYTES: 0 10*3/uL (ref 0.0–0.1)
BASOS ABS: 0.1 10*3/uL (ref 0.0–0.1)
BASOS PCT: 1 %
Eosinophils Absolute: 0.6 10*3/uL (ref 0.0–0.7)
Eosinophils Relative: 9 %
HCT: 48.6 % — ABNORMAL HIGH (ref 36.0–46.0)
HEMOGLOBIN: 15.3 g/dL — AB (ref 12.0–15.0)
IMMATURE GRANULOCYTES: 1 %
Lymphocytes Relative: 18 %
Lymphs Abs: 1.1 10*3/uL (ref 0.7–4.0)
MCH: 31.4 pg (ref 26.0–34.0)
MCHC: 31.5 g/dL (ref 30.0–36.0)
MCV: 99.8 fL (ref 78.0–100.0)
MONOS PCT: 11 %
Monocytes Absolute: 0.7 10*3/uL (ref 0.1–1.0)
Neutro Abs: 4 10*3/uL (ref 1.7–7.7)
Neutrophils Relative %: 60 %
PLATELETS: 204 10*3/uL (ref 150–400)
RBC: 4.87 MIL/uL (ref 3.87–5.11)
RDW: 13.4 % (ref 11.5–15.5)
WBC: 6.5 10*3/uL (ref 4.0–10.5)

## 2018-01-11 LAB — BRAIN NATRIURETIC PEPTIDE: B Natriuretic Peptide: 302.8 pg/mL — ABNORMAL HIGH (ref 0.0–100.0)

## 2018-01-11 LAB — COMPREHENSIVE METABOLIC PANEL
ALK PHOS: 94 U/L (ref 38–126)
ALT: 12 U/L (ref 0–44)
AST: 25 U/L (ref 15–41)
Albumin: 3.9 g/dL (ref 3.5–5.0)
Anion gap: 12 (ref 5–15)
BILIRUBIN TOTAL: 1.1 mg/dL (ref 0.3–1.2)
BUN: 18 mg/dL (ref 8–23)
CALCIUM: 8.8 mg/dL — AB (ref 8.9–10.3)
CO2: 24 mmol/L (ref 22–32)
CREATININE: 1.17 mg/dL — AB (ref 0.44–1.00)
Chloride: 109 mmol/L (ref 98–111)
GFR calc Af Amer: 47 mL/min — ABNORMAL LOW (ref >60)
GFR calc non Af Amer: 41 mL/min — ABNORMAL LOW (ref >60)
GLUCOSE: 91 mg/dL (ref 70–99)
Potassium: 4 mmol/L (ref 3.5–5.1)
SODIUM: 145 mmol/L (ref 135–145)
TOTAL PROTEIN: 7.1 g/dL (ref 6.5–8.1)

## 2018-01-11 LAB — I-STAT TROPONIN, ED: Troponin i, poc: 0.02 ng/mL (ref 0.00–0.08)

## 2018-01-11 NOTE — ED Notes (Signed)
Patient verbalizes understanding of discharge instructions. Opportunity for questioning and answers were provided. Armband removed by staff, pt discharged from ED, pt to lobby to wait for ride.

## 2018-01-11 NOTE — ED Triage Notes (Signed)
Pt arrives via [facility] from home alone with reports of bilateral lower ext swelling x1 week. SOB with exertion.

## 2018-01-11 NOTE — ED Notes (Signed)
Patient transported to X-ray 

## 2018-01-11 NOTE — Discharge Instructions (Signed)
You were seen in the ED today with leg swelling. I would like for you to increase your lasix to 20 mg daily for the next 3 days. Keep the legs elevated and use compression stockings. Call your PCP tomorrow to discuss you ED visit and coordinate additional evaluation in the clinic.

## 2018-01-11 NOTE — Progress Notes (Signed)
VASCULAR LAB PRELIMINARY  PRELIMINARY  PRELIMINARY  PRELIMINARY  Right lower extremity venous duplex completed.    Preliminary report:  There is no DVT or SVT noted in the right lower extremity.  Enlarged inguinal lymph nodes noted.  Interstitial fluid noted throughout the right calf.   Gave report  Amrita Radu, RVT 01/11/2018, 8:25 PM

## 2018-01-11 NOTE — ED Provider Notes (Signed)
Emergency Department Provider Note   I have reviewed the triage vital signs and the nursing notes.   HISTORY  Chief Complaint Leg Swelling   HPI [name] is a 84 y.o. female with PMH of complete heart block with pacemaker, HLD, and chronic venous insufficiency resents to the emergency department for evaluation of lower extremity swelling.  Patient states that both legs have been swelling over the past week but that the left leg swelling has decreased.  She has had worsening swelling in some discomfort in the right leg along with redness in bilateral legs.  She is noticed some mild exertional dyspnea but does not feel short of breath at rest.  No chest pain.  No fevers or chills.  Patient lives at home alone.  She is been taking Lasix 10 mg daily as prescribed. No radiation of symptoms. Has known right ankle/foot ulcers managed by [facility].   Past Medical History:  Diagnosis Date  . Cataract   . Complete heart block (HCC)    s/p PPM implant (MDT) by Dr Blanch Media.  Atrial lead could not be paced at time of the procedure.  She has chronic AV dysociation  . Delusional disorder ([place])   . Exogenous obesity   . Hypercholesterolemia   . Invasive ductal carcinoma of breast ([place]) 03/2007   BILATERAL BREASTS  . Orthostatic hypotension    treated with midodrine by Dr Rollene [name]  . PONV (postoperative nausea and vomiting)   . Thyroid disease   . Venous insufficiency     Patient Active Problem List   Diagnosis Date Noted  . Venous stasis dermatitis of both lower extremities 01/09/2018  . Diarrhea, chronic 01/09/2018  . Lower extremity ulceration ([place]) 10/22/2017  . Anxiety disorder 10/22/2017  . Thyroid disease   . PONV (postoperative nausea and vomiting)   . Orthostatic hypotension   . Hypercholesterolemia   . Exogenous obesity   . Rectal bleeding 03/30/2017  . GI bleed 03/28/2017  . BRBPR (bright red blood per rectum) 03/27/2017  . Sigmoid diverticulitis  10/14/2016  . Diverticulitis 10/14/2016  . Varicose veins of bilateral lower extremities with other complications 78/29/5621  . Venous insufficiency 08/25/2015  . Cellulitis 08/09/2015  . Pressure ulcer 08/09/2015  . Delusional disorder ([place]) 07/05/2015  . Bereavement reaction 07/05/2015  . Pacemaker at end of battery life 02/23/2015  . CHB (complete heart block) ([place])   . Acute GI bleeding 02/15/2015  . Acute kidney injury (nontraumatic) ([place]) 02/15/2015  . Hypothyroidism 02/15/2015  . Left upper quadrant pain   . Osteopenia 12/22/2013  . Postural dizziness 04/05/2013  . Complete heart block ([place]) 02/26/2013  . S/P placement of cardiac pacemaker 02/26/2013  . Bilateral breast cancer ([place]) 03/25/2011  . Invasive ductal carcinoma of breast ([place]) 03/30/2007    Past Surgical History:  Procedure Laterality Date  . ABDOMINAL HYSTERECTOMY    . BREAST LUMPECTOMY Bilateral 2009  . CATARACT EXTRACTION     EYE SURGERY X 2  . CHOLECYSTECTOMY    . COLONOSCOPY N/A 02/16/2015   Procedure: COLONOSCOPY;  Surgeon: Wilford Corner, MD;  Location: [facility] ENDOSCOPY;  Service: Endoscopy;  Laterality: N/A;  . EP IMPLANTABLE DEVICE N/A 02/23/2015   pacemaker generator change (MDT Sensia SR) by Dr Rayann Heman   . ESOPHAGOGASTRODUODENOSCOPY N/A 02/16/2015   Procedure: ESOPHAGOGASTRODUODENOSCOPY (EGD);  Surgeon: Wilford Corner, MD;  Location: [facility] ENDOSCOPY;  Service: Endoscopy;  Laterality: N/A;  . PACEMAKER INSERTION     MDT implanted by Dr Blanch Media.  Atrial lead placement was unsuccessful.  She has chronic AV dysociation  . remote right radical nephrectomy  2009   Allergies Amoxicillin-pot clavulanate; Erythromycin; Tape; Codeine; Flagyl [metronidazole]; Macrobid [nitrofurantoin macrocrystal]; Tolterodine; and Ciprofloxacin  Family History  Problem Relation Age of Onset  . Heart disease Maternal Grandmother   . Heart disease Mother     Social History Social History   Tobacco Use  . Smoking  status: Never Smoker  . Smokeless tobacco: Never Used  Substance Use Topics  . Alcohol use: No    Alcohol/week: 0.0 standard drinks  . Drug use: No    Review of Systems  Constitutional: No fever/chills Eyes: No visual changes. ENT: No sore throat. Cardiovascular: Denies chest pain. Bilateral LE swelling R > L.  Respiratory: Denies shortness of breath. Gastrointestinal: No abdominal pain.  No nausea, no vomiting.  No diarrhea.  No constipation. Genitourinary: Negative for dysuria. Musculoskeletal: Negative for back pain. Skin: Skin redness bilateral LEs.  Neurological: Negative for headaches, focal weakness or numbness.  10-point ROS otherwise negative.  ____________________________________________   PHYSICAL EXAM:  VITAL SIGNS: ED Triage Vitals  Enc Vitals Group     BP 01/11/18 1756 135/73     Pulse Rate 01/11/18 1756 75     Resp 01/11/18 1756 18     Temp 01/11/18 1756 97.9 F (36.6 C)     Temp Source 01/11/18 1756 Oral     SpO2 01/11/18 1748 95 %     Pain Score 01/11/18 1750 0   Constitutional: Alert and oriented. Well appearing and in no acute distress. Eyes: Conjunctivae are normal.  Head: Atraumatic. Nose: No congestion/rhinnorhea. Mouth/Throat: Mucous membranes are moist. Neck: No stridor.  Cardiovascular: Normal rate, regular rhythm. Good peripheral circulation. Grossly normal heart sounds.   Respiratory: Normal respiratory effort.  No retractions. Lungs CTAB. Gastrointestinal: Soft and nontender. No distention.  Musculoskeletal: Bilateral venous stasis dermatitis. R > L LE edema (pitting). Two venous stasis ulcers on the right ankle/foot. Clean dressings with visible granulation tissue. No bleeding or purulent drainage. No gross deformities of extremities. Neurologic:  Normal speech and language. No gross focal neurologic deficits are appreciated.  Skin:  Skin is warm, dry and intact. No rash noted.  ____________________________________________    LABS (all labs ordered are listed, but only abnormal results are displayed)  Labs Reviewed  COMPREHENSIVE METABOLIC PANEL - Abnormal; Notable for the following components:      Result Value   Creatinine, Ser 1.17 (*)    Calcium 8.8 (*)    GFR calc non Af Amer 41 (*)    GFR calc Af Amer 47 (*)    All other components within normal limits  BRAIN NATRIURETIC PEPTIDE - Abnormal; Notable for the following components:   B Natriuretic Peptide 302.8 (*)    All other components within normal limits  CBC WITH DIFFERENTIAL/PLATELET - Abnormal; Notable for the following components:   Hemoglobin 15.3 (*)    HCT 48.6 (*)    All other components within normal limits  I-STAT TROPONIN, ED   ____________________________________________  EKG   EKG Interpretation  Date/Time:  Sunday January 11 2018 17:57:38 EDT Ventricular Rate:  72 PR Interval:    QRS Duration: 164 QT Interval:  464 QTC Calculation: 508 R Axis:   -84 Text Interpretation:  V-paced rhythm.  No STEMI  Confirmed by Nanda Quinton [phone]) on 01/11/2018 6:07:27 PM       ____________________________________________  RADIOLOGY  Dg Chest 2 View  Result Date: 01/11/2018 CLINICAL DATA:  Patient with lower extremity swelling for 1 week. EXAM: CHEST - 2 VIEW COMPARISON:  Chest radiograph 09/10/2017 FINDINGS: Single lead pacer device overlies the left hemithorax. Stable cardiomegaly. Bibasilar heterogeneous pulmonary opacities. Pulmonary hyperinflation. Thoracic spine degenerative changes. IMPRESSION: Cardiomegaly. Pulmonary hyperinflation.  Basilar scarring. Electronically Signed   By: Lovey Newcomer M.D.   On: 01/11/2018 21:00   DVT US:  VASCULAR LAB PRELIMINARY  PRELIMINARY  PRELIMINARY  PRELIMINARY  Right lower extremity venous duplex completed.    Preliminary report:  There is no DVT or SVT noted in the right lower extremity.  Enlarged inguinal lymph nodes noted.  Interstitial fluid noted throughout the right calf.   Gave  report  KANADY, CANDACE, RVT 01/11/2018, 8:25 PM  ____________________________________________   PROCEDURES  Procedure(s) performed:   Procedures  None ____________________________________________   INITIAL IMPRESSION / ASSESSMENT AND PLAN / ED COURSE  Pertinent labs & imaging results that were available during my care of the patient were reviewed by me and considered in my medical decision making (see chart for details).  Patient presents to the ED with lower extremity swelling with shortness of breath on exertion.  No chest pain. No fever/chills. LE redness not consistent with cellulitis. Plan for DVT US of the RLE with asymmetric edema. Ulcers are well appearing.   Labs with no acute findings. DVT US negative. CXR with no edema. Patient feeling well. Plan to double lasix for the next 3-4 days to 20 mg daily. Patient to call PCP on Monday.   At this time, I do not feel there is any life-threatening condition present. I have reviewed and discussed all results (EKG, imaging, lab, urine as appropriate), exam findings with patient. I have reviewed nursing notes and appropriate previous records.  I feel the patient is safe to be discharged home without further emergent workup. Discussed usual and customary return precautions. Patient and family (if present) verbalize understanding and are comfortable with this plan.  Patient will follow-up with their primary care provider. If they do not have a primary care provider, information for follow-up has been provided to them. All questions have been answered.  ____________________________________________  FINAL CLINICAL IMPRESSION(S) / ED DIAGNOSES  Final diagnoses:  Peripheral edema  Right leg swelling    Note:  This document was prepared using Dragon voice recognition software and may include unintentional dictation errors.  Nanda Quinton, MD Emergency Medicine    Rhodie Cienfuegos, Wonda Olds, MD 01/12/18 1019

## 2018-01-15 ENCOUNTER — Emergency Department (HOSPITAL_COMMUNITY): Payer: Medicare Other

## 2018-01-15 ENCOUNTER — Emergency Department (HOSPITAL_COMMUNITY)
Admission: EM | Admit: 2018-01-15 | Discharge: 2018-01-15 | Disposition: A | Discharge status: Home / Self Care | Payer: Medicare Other | Attending: Emergency Medicine | Admitting: Emergency Medicine

## 2018-01-15 ENCOUNTER — Other Ambulatory Visit: Payer: Self-pay

## 2018-01-15 DIAGNOSIS — R600 Localized edema: Secondary | ICD-10-CM | POA: Diagnosis not present

## 2018-01-15 DIAGNOSIS — Z950 Presence of cardiac pacemaker: Secondary | ICD-10-CM | POA: Insufficient documentation

## 2018-01-15 DIAGNOSIS — Z853 Personal history of malignant neoplasm of breast: Secondary | ICD-10-CM | POA: Insufficient documentation

## 2018-01-15 DIAGNOSIS — Z79899 Other long term (current) drug therapy: Secondary | ICD-10-CM | POA: Diagnosis not present

## 2018-01-15 DIAGNOSIS — E039 Hypothyroidism, unspecified: Secondary | ICD-10-CM | POA: Insufficient documentation

## 2018-01-15 DIAGNOSIS — J439 Emphysema, unspecified: Secondary | ICD-10-CM | POA: Diagnosis not present

## 2018-01-15 DIAGNOSIS — R609 Edema, unspecified: Secondary | ICD-10-CM

## 2018-01-15 LAB — CBC WITH DIFFERENTIAL/PLATELET
ABS IMMATURE GRANULOCYTES: 0 10*3/uL (ref 0.0–0.1)
BASOS ABS: 0 10*3/uL (ref 0.0–0.1)
Basophils Relative: 1 %
Eosinophils Absolute: 0.5 10*3/uL (ref 0.0–0.7)
Eosinophils Relative: 7 %
HEMATOCRIT: 45.2 % (ref 36.0–46.0)
Hemoglobin: 14.6 g/dL (ref 12.0–15.0)
Immature Granulocytes: 0 %
LYMPHS ABS: 1.2 10*3/uL (ref 0.7–4.0)
Lymphocytes Relative: 17 %
MCH: 31.9 pg (ref 26.0–34.0)
MCHC: 32.3 g/dL (ref 30.0–36.0)
MCV: 98.9 fL (ref 78.0–100.0)
Monocytes Absolute: 0.6 10*3/uL (ref 0.1–1.0)
Monocytes Relative: 9 %
NEUTROS ABS: 4.7 10*3/uL (ref 1.7–7.7)
Neutrophils Relative %: 66 %
Platelets: 221 10*3/uL (ref 150–400)
RBC: 4.57 MIL/uL (ref 3.87–5.11)
RDW: 13.2 % (ref 11.5–15.5)
WBC: 7 10*3/uL (ref 4.0–10.5)

## 2018-01-15 LAB — COMPREHENSIVE METABOLIC PANEL
ALBUMIN: 3.9 g/dL (ref 3.5–5.0)
ALT: 11 U/L (ref 0–44)
ANION GAP: 12 (ref 5–15)
AST: 17 U/L (ref 15–41)
Alkaline Phosphatase: 82 U/L (ref 38–126)
BILIRUBIN TOTAL: 0.7 mg/dL (ref 0.3–1.2)
BUN: 15 mg/dL (ref 8–23)
CHLORIDE: 109 mmol/L (ref 98–111)
CO2: 23 mmol/L (ref 22–32)
Calcium: 9 mg/dL (ref 8.9–10.3)
Creatinine, Ser: 1.16 mg/dL — ABNORMAL HIGH (ref 0.44–1.00)
GFR calc Af Amer: 48 mL/min — ABNORMAL LOW (ref >60)
GFR, EST NON AFRICAN AMERICAN: 41 mL/min — AB (ref >60)
GLUCOSE: 94 mg/dL (ref 70–99)
POTASSIUM: 3.5 mmol/L (ref 3.5–5.1)
Sodium: 144 mmol/L (ref 135–145)
TOTAL PROTEIN: 6.9 g/dL (ref 6.5–8.1)

## 2018-01-15 LAB — BRAIN NATRIURETIC PEPTIDE: B NATRIURETIC PEPTIDE 5: 313.7 pg/mL — AB (ref 0.0–100.0)

## 2018-01-15 LAB — I-STAT CG4 LACTIC ACID, ED: LACTIC ACID, VENOUS: 1.28 mmol/L (ref 0.5–1.9)

## 2018-01-15 MED ORDER — DOXYCYCLINE HYCLATE 100 MG PO CAPS: 100.0000 mg | ORAL_CAPSULE | Freq: Two times a day (BID) | ORAL | 0 refills | Status: DC

## 2018-01-15 NOTE — ED Provider Notes (Signed)
[place] EMERGENCY DEPARTMENT Provider Note   CSN: [id number] Arrival date & time: 01/15/18  1142     History   Chief Complaint Chief Complaint  Patient presents with  . Leg Swelling    HPI [name] is a 84 y.o. female.  84yo F w/ PMH including breast cancer, pacemaker, delusional disorder who p/w b/l lower extremity swelling. PT states she has had ongoing problems with b/l leg swelling despite increasing her dose of lasix for the past several days. She tries to keep legs elevated but does not use compression stockings. She has chronic R lower leg wounds that are followed by wound clinic. She denies fevers, chest pain, or SOB. She was seen by PCP on 9/13 for same symptoms and was given supportive instructions. She presented to the ED on 9/16 for the same complaints and reassuring w/u including negative DVT US of R leg.   The history is provided by the patient.    Past Medical History:  Diagnosis Date  . Cataract   . Complete heart block (HCC)    s/p PPM implant (MDT) by Dr Blanch Media.  Atrial lead could not be paced at time of the procedure.  She has chronic AV dysociation  . Delusional disorder ([name])   . Exogenous obesity   . Hypercholesterolemia   . Invasive ductal carcinoma of breast ([name]) 03/2007   BILATERAL BREASTS  . Orthostatic hypotension    treated with midodrine by Dr Rollene [name]  . PONV (postoperative nausea and vomiting)   . Thyroid disease   . Venous insufficiency     Patient Active Problem List   Diagnosis Date Noted  . Venous stasis dermatitis of both lower extremities 01/09/2018  . Diarrhea, chronic 01/09/2018  . Lower extremity ulceration ([name]) 10/22/2017  . Anxiety disorder 10/22/2017  . Thyroid disease   . PONV (postoperative nausea and vomiting)   . Orthostatic hypotension   . Hypercholesterolemia   . Exogenous obesity   . Rectal bleeding 03/30/2017  . GI bleed 03/28/2017  . BRBPR (bright red blood per rectum)  03/27/2017  . Sigmoid diverticulitis 10/14/2016  . Diverticulitis 10/14/2016  . Varicose veins of bilateral lower extremities with other complications 33/00/7622  . Venous insufficiency 08/25/2015  . Cellulitis 08/09/2015  . Pressure ulcer 08/09/2015  . Delusional disorder ([name]) 07/05/2015  . Bereavement reaction 07/05/2015  . Pacemaker at end of battery life 02/23/2015  . CHB (complete heart block) ([place])   . Acute GI bleeding 02/15/2015  . Acute kidney injury (nontraumatic) ([place]) 02/15/2015  . Hypothyroidism 02/15/2015  . Left upper quadrant pain   . Osteopenia 12/22/2013  . Postural dizziness 04/05/2013  . Complete heart block ([place]) 02/26/2013  . S/P placement of cardiac pacemaker 02/26/2013  . Bilateral breast cancer ([name]) 03/25/2011  . Invasive ductal carcinoma of breast ([name]) 03/30/2007    Past Surgical History:  Procedure Laterality Date  . ABDOMINAL HYSTERECTOMY    . BREAST LUMPECTOMY Bilateral 2009  . CATARACT EXTRACTION     EYE SURGERY X 2  . CHOLECYSTECTOMY    . COLONOSCOPY N/A 02/16/2015   Procedure: COLONOSCOPY;  Surgeon: Wilford Corner, MD;  [facility] ENDOSCOPY;  Service: Endoscopy;  Laterality: N/A;  . EP IMPLANTABLE DEVICE N/A 02/23/2015   pacemaker generator change (MDT Sensia SR) by Dr Rayann Heman   . ESOPHAGOGASTRODUODENOSCOPY N/A 02/16/2015   Procedure: ESOPHAGOGASTRODUODENOSCOPY (EGD);  Surgeon: Wilford Corner, MD;  Location: [facility] ENDOSCOPY;  Service: Endoscopy;  Laterality: N/A;  . PACEMAKER INSERTION  MDT implanted by Dr Blanch Media.  Atrial lead placement was unsuccessful.  She has chronic AV dysociation  . remote right radical nephrectomy  2009     OB History   None      Home Medications    Prior to Admission medications   Medication Sig Start Date End Date Taking? Authorizing Provider  Calcium Carbonate-Vitamin D (CALTRATE 600+D PO) Take 600 mg by mouth daily after breakfast.   Yes [provider]  furosemide (LASIX) 20 MG tablet  Take 10 mg by mouth daily.    Yes [provider]  gentamicin cream (GARAMYCIN) 0.1 % Apply 1 application topically 3 (three) times daily. 09/08/17  Yes Edrick Kins, DPM  latanoprost (XALATAN) 0.005 % ophthalmic solution Place 1 drop into both eyes at bedtime.  07/03/17  Yes [provider]  letrozole (FEMARA) 2.5 MG tablet Take 1 tablet (2.5 mg total) by mouth daily. Patient taking differently: Take 2.5 mg by mouth daily with breakfast.  09/19/16  Yes [name], MD  levothyroxine (SYNTHROID, LEVOTHROID) 50 MCG tablet Take 50 mcg by mouth daily before breakfast.    Yes [provider]  lipase/protease/amylase (CREON) 36000 UNITS CPEP capsule Take 36,000 Units by mouth 3 (three) times daily before meals.   Yes [provider]  midodrine (PROAMATINE) 2.5 MG tablet Take 2.5 mg by mouth daily after breakfast.    Yes [provider]  triamcinolone ointment (KENALOG) 0.1 % Apply 1 application topically daily as needed. Lower extremities. 01/09/18  Yes Martinique, Nichole G, MD  doxycycline (VIBRAMYCIN) 100 MG capsule Take 1 capsule (100 mg total) by mouth 2 (two) times daily. 01/15/18   [name], [name], MD    Family History Family History  Problem Relation Age of Onset  . Heart disease Maternal Grandmother   . Heart disease Mother     Social History Social History   Tobacco Use  . Smoking status: Never Smoker  . Smokeless tobacco: Never Used  Substance Use Topics  . Alcohol use: No    Alcohol/week: 0.0 standard drinks  . Drug use: No     Allergies   Amoxicillin-pot clavulanate; Erythromycin; Tape; Codeine; Flagyl [metronidazole]; Macrobid [nitrofurantoin macrocrystal]; Tolterodine; and Ciprofloxacin   Review of Systems Review of Systems All other systems reviewed and are negative except that which was mentioned in HPI   Physical Exam Updated Vital Signs BP 126/84   Pulse 61   Temp 98.7 F (37.1 C) (Oral)   Resp 20   SpO2 99%    Physical Exam  Constitutional: She is oriented to person, place, and time. She appears well-developed and well-nourished. No distress.  HENT:  Head: Normocephalic and atraumatic.  Eyes: Conjunctivae are normal.  Neck: Neck supple.  Cardiovascular: Normal rate and regular rhythm.  Pulmonary/Chest: Effort normal and breath sounds normal.  Abdominal: Soft. Bowel sounds are normal. She exhibits no distension. There is no tenderness.  Musculoskeletal: She exhibits edema.  2+ pitting edema BLE R>L; b/l chronic venous stasis dermatitis; R leg w/ diffuse erythema, no erythema extending above knee; chronic ulceration on R lateral foot and R dorsal 2nd toe with no drainage  Neurological: She is alert and oriented to person, place, and time.  Fluent speech  Skin: Skin is warm and dry.  Psychiatric: She has a normal mood and affect. Judgment normal.  Nursing note and vitals reviewed.    ED Treatments / Results  Labs (all labs ordered are listed, but only abnormal results are displayed) Labs Reviewed  COMPREHENSIVE METABOLIC PANEL - Abnormal; Notable for the following components:      Result Value   Creatinine, Ser 1.16 (*)    GFR calc non Af Amer 41 (*)    GFR calc Af Amer 48 (*)    All other components within normal limits  BRAIN NATRIURETIC PEPTIDE - Abnormal; Notable for the following components:   B Natriuretic Peptide 313.7 (*)    All other components within normal limits  CBC WITH DIFFERENTIAL/PLATELET  I-STAT CG4 LACTIC ACID, ED    EKG None  Radiology Dg Chest 2 View  Result Date: 01/15/2018 CLINICAL DATA:  Right lower extremity erythema and swelling EXAM: CHEST - 2 VIEW COMPARISON:  PA and lateral chest x-ray of January 11, 2018 FINDINGS: The lungs are well-expanded. There is no focal infiltrate. There is no pleural effusion. The heart is top-normal in size. The pulmonary vascularity is not engorged. The ICD is in stable position. There is gentle dextrocurvature centered  in the midthoracic spine. There is no acute bony abnormality. IMPRESSION: No CHF nor pneumonia. Top-normal cardiac size without pulmonary vascular congestion. Electronically Signed   By: David  Martinique M.D.   On: 01/15/2018 12:35    Procedures Procedures (including critical care time)  Medications Ordered in ED Medications - No data to display   Initial Impression / Assessment and Plan / ED Course  I have reviewed the triage vital signs and the nursing notes.  Pertinent labs that were available during my care of the patient were reviewed by me and considered in my medical decision making (see chart for details).     Labs today again show no evidence of heart failure, liver failure, or kidney failure as cause of peripheral edema. Given chronicity and recent negative DVT US, I do not feel her symptoms are due to DVT. It is unclear whether R leg redness was present on previous exams. Because of chronic ulcerations, she is at risk for cellulitis therefore recommended a course of antibiotics for R leg. Gave Rx for doxycycline. Emphasized that chronic venous stasis is a permanent condition and will likely never completely resolve, but can be improved with compression stockings and elevation. Instructed to f/u with PCP in a few days for recheck of R leg. Return precautions reviewed.  Final Clinical Impressions(s) / ED Diagnoses   Final diagnoses:  Peripheral edema    ED Discharge Orders         Ordered    doxycycline (VIBRAMYCIN) 100 MG capsule  2 times daily     01/15/18 1634           Beryle Bagsby, [name], MD 01/16/18 1030

## 2018-01-15 NOTE — ED Notes (Signed)
Pt alert and oriented in NAD. Pt verbalized understanding of discharge instructions. 

## 2018-01-15 NOTE — ED Triage Notes (Addendum)
PT has been taking lasix for days trying to get swelling. She states that someone put different pill in her pillbox. She seems to be paranoid about someone coming in to her room and switching pills. Her legs remain reddened and swollen. She states they do hurt but mild. RN did identify that pills she is taking is lasix. She says been switched and not working.

## 2018-01-19 ENCOUNTER — Encounter: Payer: Self-pay | Admitting: Family Medicine

## 2018-01-19 ENCOUNTER — Ambulatory Visit (INDEPENDENT_AMBULATORY_CARE_PROVIDER_SITE_OTHER): Payer: Medicare Other | Admitting: Family Medicine

## 2018-01-19 VITALS — BP 118/76 | HR 70 | Temp 97.8°F | Resp 16 | Ht 67.0 in | Wt 213.0 lb

## 2018-01-19 DIAGNOSIS — R2681 Unsteadiness on feet: Secondary | ICD-10-CM

## 2018-01-19 DIAGNOSIS — I872 Venous insufficiency (chronic) (peripheral): Secondary | ICD-10-CM

## 2018-01-19 DIAGNOSIS — L97912 Non-pressure chronic ulcer of unspecified part of right lower leg with fat layer exposed: Secondary | ICD-10-CM

## 2018-01-19 MED ORDER — FUROSEMIDE 20 MG PO TABS: 20.0000 mg | ORAL_TABLET | Freq: Every day | ORAL | 1 refills | Status: DC

## 2018-01-19 NOTE — Assessment & Plan Note (Signed)
Fall precautions discussed. She may benefit from PT for fall prevention. She is using a cane but may need a walker. PT/OT evaluation through [facility] will be arranged.

## 2018-01-19 NOTE — Assessment & Plan Note (Signed)
She is following with podiatrist. It is stable. Instructed about warning signs. Keep appointment with podiatrist in 2 weeks.

## 2018-01-19 NOTE — Assessment & Plan Note (Signed)
Educated about diagnosis, prognosis, and treatment options. Instructed to increase furosemide from 10 mg to 20 mg daily. Decrease salt intake. K+ rich diet. LE elevation above waist level and compression stockings. I think she would benefit from [place] around right lower extremity, HH will be arranged due to difficulties leaving home for appointments.

## 2018-01-19 NOTE — Progress Notes (Signed)
HPI:   Nichole.[name] is a 84 y.o. female, who is here today to follow on recent ER visits.  I saw her last on 01/09/18 for LE edema. She took Furosemide 20 mg daily x 3 days and went back to 10 mg daily.  Since her last OV she has been in the ER 2 times.  01/11/18 and 01/15/18.  Lab Results  Component Value Date   WBC 7.0 01/15/2018   HGB 14.6 01/15/2018   HCT 45.2 01/15/2018   MCV 98.9 01/15/2018   PLT 221 01/15/2018   Lab Results  Component Value Date   CREATININE 1.16 (H) 01/15/2018   BUN 15 01/15/2018   NA 144 01/15/2018   K 3.5 01/15/2018   CL 109 01/15/2018   CO2 23 01/15/2018   BNP mildly elevated at  313.7 (302).  She denies orthopnea or PND.  + Pruritus,she is applying topical Triamcinolone,which has helped.  LE edema is much better in the morning when she gets up. Exacerbated by prolonged standing, R>L.  Left LE edema and erythema have improved. Right LE symptoms persist but stable.  01/11/18 LE venous US negative for DVT.  CXR done both times she was in the ER, 01/15/18: No CHF nor pneumonia.Top-normal cardiac size without pulmonary vascular congestion.  Discharged on Doxycycline,she has 3 days left. No side effects reported.  She has Hx varicose vein disease with venous ulcer right ankle and right 4th toe, following with podiatrist.   Review of Systems  Constitutional: Negative for activity change, appetite change, fatigue and fever.  HENT: Negative for mouth sores and nosebleeds.   Eyes: Negative for redness and visual disturbance.  Respiratory: Negative for cough, shortness of breath and wheezing.   Cardiovascular: Positive for leg swelling. Negative for chest pain.  Gastrointestinal: Negative for abdominal pain, nausea and vomiting.       Negative for changes in bowel habits.  Genitourinary: Negative for decreased urine volume, dysuria and hematuria.  Musculoskeletal: Positive for arthralgias and gait problem.  Skin: Positive for  rash and wound.  Neurological: Negative for syncope, weakness and headaches.  Psychiatric/Behavioral: Negative for confusion. The patient is nervous/anxious.       Current Outpatient Medications on File Prior to Visit  Medication Sig Dispense Refill  . Calcium Carbonate-Vitamin D (CALTRATE 600+D PO) Take 600 mg by mouth daily after breakfast.    . doxycycline (VIBRAMYCIN) 100 MG capsule Take 1 capsule (100 mg total) by mouth 2 (two) times daily. 14 capsule 0  . gentamicin cream (GARAMYCIN) 0.1 % Apply 1 application topically 3 (three) times daily. 30 g 1  . latanoprost (XALATAN) 0.005 % ophthalmic solution Place 1 drop into both eyes at bedtime.   10  . letrozole (FEMARA) 2.5 MG tablet Take 1 tablet (2.5 mg total) by mouth daily. (Patient taking differently: Take 2.5 mg by mouth daily with breakfast. ) 90 tablet 3  . levothyroxine (SYNTHROID, LEVOTHROID) 50 MCG tablet Take 50 mcg by mouth daily before breakfast.     . lipase/protease/amylase (CREON) 36000 UNITS CPEP capsule Take 36,000 Units by mouth 3 (three) times daily before meals.    . midodrine (PROAMATINE) 2.5 MG tablet Take 2.5 mg by mouth daily after breakfast.     . triamcinolone ointment (KENALOG) 0.1 % Apply 1 application topically daily as needed. Lower extremities. 45 g 1   No current facility-administered medications on file prior to visit.      Past Medical History:  Diagnosis Date  .  Cataract   . Complete heart block (HCC)    s/p PPM implant (MDT) by Dr Blanch Media.  Atrial lead could not be paced at time of the procedure.  She has chronic AV dysociation  . Delusional disorder ([place])   . Exogenous obesity   . Hypercholesterolemia   . Invasive ductal carcinoma of breast ([place]) 03/2007   BILATERAL BREASTS  . Orthostatic hypotension    treated with midodrine by Dr Rollene [name]  . PONV (postoperative nausea and vomiting)   . Thyroid disease   . Venous insufficiency    Allergies  Allergen Reactions  . Amoxicillin-Pot  Clavulanate Diarrhea and Nausea And Vomiting    Has patient had a PCN reaction causing immediate rash, facial/tongue/throat swelling, SOB or lightheadedness with hypotension: Yes Has patient had a PCN reaction causing severe rash involving mucus membranes or skin necrosis: No Has patient had a PCN reaction that required hospitalization: No Has patient had a PCN reaction occurring within the last 10 years: Yes If all of the above answers are "NO", then may proceed with Cephalosporin use.   . Erythromycin Hives  . Tape Other (See Comments)    BAND AIDS-SKIN IRRITATION  . Codeine Nausea And Vomiting  . Flagyl [Metronidazole] Hives  . Macrobid [Nitrofurantoin Macrocrystal] Nausea And Vomiting  . Tolterodine Nausea And Vomiting  . Ciprofloxacin Hives and Rash    Social History   Socioeconomic History  . Marital status: Widowed    Spouse name: Not on file  . Number of children: Not on file  . Years of education: Not on file  . Highest education level: Not on file  Occupational History  . Not on file  Social Needs  . Financial resource strain: Not on file  . Food insecurity:    Worry: Not on file    Inability: Not on file  . Transportation needs:    Medical: Not on file    Non-medical: Not on file  Tobacco Use  . Smoking status: Never Smoker  . Smokeless tobacco: Never Used  Substance and Sexual Activity  . Alcohol use: No    Alcohol/week: 0.0 standard drinks  . Drug use: No  . Sexual activity: Not Currently  Lifestyle  . Physical activity:    Days per week: Not on file    Minutes per session: Not on file  . Stress: Not on file  Relationships  . Social connections:    Talks on phone: Not on file    Gets together: Not on file    Attends religious service: Not on file    Active member of club or organization: Not on file    Attends meetings of clubs or organizations: Not on file    Relationship status: Not on file  Other Topics Concern  . Not on file  Social History  Narrative  . Not on file    Vitals:   01/19/18 1420  BP: 118/76  Pulse: 70  Resp: 16  Temp: 97.8 F (36.6 C)  SpO2: 95%   Body mass index is 33.36 kg/m.   Physical Exam  Nursing note and vitals reviewed. Constitutional: She is oriented to person, place, and time. She appears well-developed. No distress.  HENT:  Head: Normocephalic and atraumatic.  Mouth/Throat: Oropharynx is clear and moist. Mucous membranes are dry.  Eyes: Pupils are equal, round, and reactive to light. Conjunctivae are normal.  Cardiovascular: Normal rate and regular rhythm.  No murmur heard. Pulses:      Dorsalis pedis pulses are 2+  on the right side, and 2+ on the left side.  Respiratory: Effort normal and breath sounds normal. No respiratory distress.  GI: Soft. There is no hepatomegaly. There is no tenderness.  Musculoskeletal: She exhibits edema (2+ pitting RLE and 1+ LLE.). She exhibits no tenderness.  Neurological: She is alert and oriented to person, place, and time. She has normal strength.  Unstable gait assisted with a cane.  Skin: Skin is warm. No rash noted. There is erythema (RLE).  Right lateral ankle with wound, 2 cm,fat exposed. No erythema or odor.  Psychiatric: Her mood appears anxious.  Well groomed, good eye contact.    ASSESSMENT AND PLAN:  [name] was here today to follow on ER visits.  Orders Placed This Encounter  Procedures  . Ambulatory referral to Home Health    Venous stasis dermatitis of both lower extremities Educated about diagnosis, prognosis, and treatment options. Instructed to increase furosemide from 10 mg to 20 mg daily. Decrease salt intake. K+ rich diet. LE elevation above waist level and compression stockings. I think she would benefit from [place] around right lower extremity, HH will be arranged due to difficulties leaving home for appointments.  Lower extremity ulceration ([place]) She is following with podiatrist. It is stable. Instructed  about warning signs. Keep appointment with podiatrist in 2 weeks.  Unstable gait Fall precautions discussed. She may benefit from PT for fall prevention. She is using a cane but may need a walker. PT/OT evaluation through [facility] will be arranged.      Keoshia Steinmetz G. Martinique, MD  [facility]. [place] office.

## 2018-01-19 NOTE — Patient Instructions (Addendum)
A few things to remember from today's visit:   Venous stasis dermatitis of both lower extremities - Plan: Ambulatory referral to [facility]   Vein disease is a condition that can affect the veins in the legs. It can cause leg pain, varicose veins, swollen legs, or open sores. Varicose veins are swollen and twisted veins. Things that may help: leg exercises (ankle flexion, walking),compression stocking, OTC horse chestnut seed extract 300 mg twice daily, for itchy skin cortisone and moisturizers.  Compression stockings- [facility] in [place]  Please be sure medication list is accurate. If a new problem present, please set up appointment sooner than planned today.

## 2018-01-20 DIAGNOSIS — Z853 Personal history of malignant neoplasm of breast: Secondary | ICD-10-CM | POA: Diagnosis not present

## 2018-01-20 DIAGNOSIS — R2689 Other abnormalities of gait and mobility: Secondary | ICD-10-CM | POA: Diagnosis not present

## 2018-01-20 DIAGNOSIS — I10 Essential (primary) hypertension: Secondary | ICD-10-CM | POA: Diagnosis not present

## 2018-01-20 DIAGNOSIS — I83018 Varicose veins of right lower extremity with ulcer other part of lower leg: Secondary | ICD-10-CM | POA: Diagnosis not present

## 2018-01-20 DIAGNOSIS — I8312 Varicose veins of left lower extremity with inflammation: Secondary | ICD-10-CM | POA: Diagnosis not present

## 2018-01-20 DIAGNOSIS — Z79899 Other long term (current) drug therapy: Secondary | ICD-10-CM | POA: Diagnosis not present

## 2018-01-23 ENCOUNTER — Telehealth: Payer: Self-pay | Admitting: Family Medicine

## 2018-01-23 DIAGNOSIS — I10 Essential (primary) hypertension: Secondary | ICD-10-CM | POA: Diagnosis not present

## 2018-01-23 DIAGNOSIS — Z79899 Other long term (current) drug therapy: Secondary | ICD-10-CM | POA: Diagnosis not present

## 2018-01-23 DIAGNOSIS — I8312 Varicose veins of left lower extremity with inflammation: Secondary | ICD-10-CM | POA: Diagnosis not present

## 2018-01-23 DIAGNOSIS — R2689 Other abnormalities of gait and mobility: Secondary | ICD-10-CM | POA: Diagnosis not present

## 2018-01-23 DIAGNOSIS — I83018 Varicose veins of right lower extremity with ulcer other part of lower leg: Secondary | ICD-10-CM | POA: Diagnosis not present

## 2018-01-23 DIAGNOSIS — Z853 Personal history of malignant neoplasm of breast: Secondary | ICD-10-CM | POA: Diagnosis not present

## 2018-01-23 NOTE — Telephone Encounter (Signed)
Okay for verbal orders. 

## 2018-01-23 NOTE — Telephone Encounter (Signed)
Copied from [place] [id number]. Topic: General - Other >> Jan 23, 2018 10:06 AM Yvette Rack wrote: Reason for CRM: PT Almira from [facility] home health [phone] calling for verbal orders for 1 time a week for 1 weeks and 2 times a week for 3 weeks starting next week

## 2018-01-26 ENCOUNTER — Ambulatory Visit (INDEPENDENT_AMBULATORY_CARE_PROVIDER_SITE_OTHER): Payer: Medicare Other | Admitting: *Deleted

## 2018-01-26 DIAGNOSIS — I442 Atrioventricular block, complete: Secondary | ICD-10-CM | POA: Diagnosis not present

## 2018-01-26 NOTE — Telephone Encounter (Signed)
It is ok to given verbal authorization for PT as requested.  Thanks, BJ

## 2018-01-26 NOTE — Telephone Encounter (Signed)
Verbal orders given as directed. 

## 2018-01-27 NOTE — Progress Notes (Signed)
Remote pacemaker transmission.   

## 2018-01-28 DIAGNOSIS — Z853 Personal history of malignant neoplasm of breast: Secondary | ICD-10-CM | POA: Diagnosis not present

## 2018-01-28 DIAGNOSIS — I83018 Varicose veins of right lower extremity with ulcer other part of lower leg: Secondary | ICD-10-CM | POA: Diagnosis not present

## 2018-01-28 DIAGNOSIS — Z79899 Other long term (current) drug therapy: Secondary | ICD-10-CM | POA: Diagnosis not present

## 2018-01-28 DIAGNOSIS — R2689 Other abnormalities of gait and mobility: Secondary | ICD-10-CM | POA: Diagnosis not present

## 2018-01-28 DIAGNOSIS — I10 Essential (primary) hypertension: Secondary | ICD-10-CM | POA: Diagnosis not present

## 2018-01-28 DIAGNOSIS — I8312 Varicose veins of left lower extremity with inflammation: Secondary | ICD-10-CM | POA: Diagnosis not present

## 2018-01-30 DIAGNOSIS — I8312 Varicose veins of left lower extremity with inflammation: Secondary | ICD-10-CM | POA: Diagnosis not present

## 2018-01-30 DIAGNOSIS — Z79899 Other long term (current) drug therapy: Secondary | ICD-10-CM | POA: Diagnosis not present

## 2018-01-30 DIAGNOSIS — I83018 Varicose veins of right lower extremity with ulcer other part of lower leg: Secondary | ICD-10-CM | POA: Diagnosis not present

## 2018-01-30 DIAGNOSIS — R2689 Other abnormalities of gait and mobility: Secondary | ICD-10-CM | POA: Diagnosis not present

## 2018-01-30 DIAGNOSIS — I10 Essential (primary) hypertension: Secondary | ICD-10-CM | POA: Diagnosis not present

## 2018-01-30 DIAGNOSIS — Z853 Personal history of malignant neoplasm of breast: Secondary | ICD-10-CM | POA: Diagnosis not present

## 2018-01-30 LAB — CUP PACEART REMOTE DEVICE CHECK
Battery Remaining Longevity: 67 mo
Date Time Interrogation Session: 20190930111808
Implantable Lead Model: 4076
Implantable Pulse Generator Implant Date: 20161027
Lead Channel Impedance Value: 0 Ohm
Lead Channel Pacing Threshold Amplitude: 1 V
Lead Channel Setting Pacing Amplitude: 2 V
MDC IDC LEAD IMPLANT DT: 20141208
MDC IDC LEAD LOCATION: 753860
MDC IDC MSMT BATTERY IMPEDANCE: 787 Ohm
MDC IDC MSMT BATTERY VOLTAGE: 2.77 V
MDC IDC MSMT LEADCHNL RV IMPEDANCE VALUE: 388 Ohm
MDC IDC MSMT LEADCHNL RV PACING THRESHOLD PULSEWIDTH: 0.4 ms
MDC IDC SET LEADCHNL RV PACING PULSEWIDTH: 0.46 ms
MDC IDC SET LEADCHNL RV SENSING SENSITIVITY: 2 mV
MDC IDC STAT BRADY RV PERCENT PACED: 100 %

## 2018-02-02 ENCOUNTER — Ambulatory Visit (INDEPENDENT_AMBULATORY_CARE_PROVIDER_SITE_OTHER): Payer: Medicare Other | Admitting: Podiatry

## 2018-02-02 ENCOUNTER — Encounter: Payer: Self-pay | Admitting: Podiatry

## 2018-02-02 DIAGNOSIS — L97512 Non-pressure chronic ulcer of other part of right foot with fat layer exposed: Secondary | ICD-10-CM

## 2018-02-02 DIAGNOSIS — L97519 Non-pressure chronic ulcer of other part of right foot with unspecified severity: Secondary | ICD-10-CM

## 2018-02-02 DIAGNOSIS — L97312 Non-pressure chronic ulcer of right ankle with fat layer exposed: Secondary | ICD-10-CM

## 2018-02-02 DIAGNOSIS — I83015 Varicose veins of right lower extremity with ulcer other part of foot: Secondary | ICD-10-CM | POA: Diagnosis not present

## 2018-02-02 MED ORDER — GENTAMICIN SULFATE 0.1 % EX CREA: 1.0000 "application " | TOPICAL_CREAM | Freq: Three times a day (TID) | CUTANEOUS | 1 refills | Status: DC

## 2018-02-02 NOTE — Progress Notes (Signed)
Subjective:  84 year old female presents today for follow up evaluation of ulcerations noted to the right lower extremity.  Patient states that her primary care physician, [name], placed orders to apply an Unna boot to the right lower extremity secondary to swelling and hypertension.  The nurse is coming to the home once or twice per week to apply the Unna boots.  She has no new complaints at this time.   Past Medical History:  Diagnosis Date  . Cataract   . Complete heart block (HCC)    s/p PPM implant (MDT) by Dr Blanch Media.  Atrial lead could not be paced at time of the procedure.  She has chronic AV dysociation  . Delusional disorder ([place])   . Exogenous obesity   . Hypercholesterolemia   . Invasive ductal carcinoma of breast ([place]) 03/2007   BILATERAL BREASTS  . Orthostatic hypotension    treated with midodrine by Dr Rollene [name]  . PONV (postoperative nausea and vomiting)   . Thyroid disease   . Venous insufficiency             Objective/Physical Exam General: The patient is alert and oriented x3 in no acute distress.  Dermatology:  Wound #1 noted to the lateral right ankle measuring 1.7 x 2.0 x 0.2 cm (LxWxD).   Wound #2 noted tot he right fourth toe measuring 0.5 x 0.5 x 0.1 cm.  To the noted ulceration(s), there is no eschar. There is a moderate amount of slough, fibrin, and necrotic tissue noted. Granulation tissue and wound base is red. There is a minimal amount of serosanguineous drainage noted. There is no exposed bone muscle-tendon ligament or joint. There is no malodor. Periwound integrity is intact. Skin is warm, dry and supple bilateral lower extremities.  Vascular: Palpable pedal pulses bilaterally. Capillary refill within normal limits. Varicosities noted bilateral lower extremities.   Neurological: Epicritic and protective threshold diminished bilaterally.   Musculoskeletal Exam: Range of motion within normal limits to all pedal and ankle joints  bilateral. Muscle strength 5/5 in all groups bilateral.    Assessment: #1 ulceration to the right lateral ankle secondary to venous insufficiency  #2 ulceration to the right fourth toe secondary to venous insufficiency  #3 varicosities bilateral lower extremities #4  Edema w/ erythema right lower extremity #5 Diabetes Mellitus w/ peripheral neuropathy  Plan of Care:  #1 Patient was evaluated. #2 medically necessary excisional debridement including subcutaneous tissue was performed using a tissue nipper and a chisel blade. Excisional debridement of all the necrotic nonviable tissue down to healthy bleeding viable tissue was performed with post-debridement measurements same as pre-. #3 the wound was cleansed with normal saline. #4  Refill prescription for gentamicin cream and a Band-Aid daily to the toe ulceration. #5 continue with the home health application of the Unna boots once or twice per week as per her PCP, [name], to address the right lower extremity edema with erythema #6 return to clinic in 4 weeks  Edrick Kins, DPM [facility]  Dr. Edrick Kins, [address]                                        [place], [place] [address]                Office [phone]  Fax [phone]

## 2018-02-03 ENCOUNTER — Telehealth: Payer: Self-pay | Admitting: Podiatry

## 2018-02-03 ENCOUNTER — Telehealth: Payer: Self-pay | Admitting: Family Medicine

## 2018-02-03 DIAGNOSIS — Z853 Personal history of malignant neoplasm of breast: Secondary | ICD-10-CM | POA: Diagnosis not present

## 2018-02-03 DIAGNOSIS — Z79899 Other long term (current) drug therapy: Secondary | ICD-10-CM | POA: Diagnosis not present

## 2018-02-03 DIAGNOSIS — I83018 Varicose veins of right lower extremity with ulcer other part of lower leg: Secondary | ICD-10-CM | POA: Diagnosis not present

## 2018-02-03 DIAGNOSIS — I10 Essential (primary) hypertension: Secondary | ICD-10-CM | POA: Diagnosis not present

## 2018-02-03 DIAGNOSIS — I8312 Varicose veins of left lower extremity with inflammation: Secondary | ICD-10-CM | POA: Diagnosis not present

## 2018-02-03 DIAGNOSIS — R2689 Other abnormalities of gait and mobility: Secondary | ICD-10-CM | POA: Diagnosis not present

## 2018-02-03 NOTE — Telephone Encounter (Signed)
Copied from [place] [id number]. Topic: General - Other >> Feb 03, 2018  3:31 PM Margot Ables wrote: Reason for CRM: requesting VO for SW to go out to eval and treat. She has secure VM if no answer.

## 2018-02-03 NOTE — Telephone Encounter (Signed)
This is Olivia Mackie, Therapist, sports with [facility]. Dr. [name] prescribed gentamicin cream and we need the orders for that. If you could fax his orders to [phone] or you can call me. My number is [phone]. Thank you.

## 2018-02-03 NOTE — Telephone Encounter (Signed)
[name] - [facility] asked how often to apply the gentamicin cream to right ankle and 4th toe, and if the unna boots were to be B/L.

## 2018-02-03 NOTE — Telephone Encounter (Signed)
Message sent to Dr. Jordan for review and approval. 

## 2018-02-04 DIAGNOSIS — Z79899 Other long term (current) drug therapy: Secondary | ICD-10-CM | POA: Diagnosis not present

## 2018-02-04 DIAGNOSIS — Z853 Personal history of malignant neoplasm of breast: Secondary | ICD-10-CM | POA: Diagnosis not present

## 2018-02-04 DIAGNOSIS — I8312 Varicose veins of left lower extremity with inflammation: Secondary | ICD-10-CM | POA: Diagnosis not present

## 2018-02-04 DIAGNOSIS — R2689 Other abnormalities of gait and mobility: Secondary | ICD-10-CM | POA: Diagnosis not present

## 2018-02-04 DIAGNOSIS — I10 Essential (primary) hypertension: Secondary | ICD-10-CM | POA: Diagnosis not present

## 2018-02-04 DIAGNOSIS — I83018 Varicose veins of right lower extremity with ulcer other part of lower leg: Secondary | ICD-10-CM | POA: Diagnosis not present

## 2018-02-04 NOTE — Telephone Encounter (Addendum)
I asked Dr. [name], if the unna boot was to be for the right lower extremity only, and if the gentamicin cream was to be placed on the lateral ankle ulcer and the right 4th toe and when, and if the unna boot was to be changed every other day. Dr. [name] states the unna boot is to be on the right lower extremity with gentamicin cream to the right lateral ankle ulcer, and the unna boot is to be changed every other day, the right 4th toe gentamicin cream and dressing is every day and my be changed by pt. Left message informing Olivia Mackie - [facility] I had [place] orders to call and I would also fax to [facility]. Faxed orders to [facility].

## 2018-02-04 NOTE — Telephone Encounter (Signed)
Spoke with [name] and gave verbal orders for SW to go out and evaluate as requested.

## 2018-02-05 NOTE — Telephone Encounter (Signed)
This is Linus Orn, Therapist, sports with [facility]. We are currently seeing pt once a week but if you need her to be seen every other day or twice a week we need those orders faxed to [facility] as we never received them. You can call me back at [phone].

## 2018-02-05 NOTE — Telephone Encounter (Signed)
I informed Olivia Mackie - [facility] that orders had been faxed 02/04/2018 for unna boot changes qod. Olivia Mackie states if we would like unna boot changed 3x week needs to be noted. I wrote addendum - unna boot to be changed 3 times a week and faxed to [facility].

## 2018-02-06 ENCOUNTER — Telehealth: Payer: Self-pay | Admitting: *Deleted

## 2018-02-06 DIAGNOSIS — I10 Essential (primary) hypertension: Secondary | ICD-10-CM | POA: Diagnosis not present

## 2018-02-06 DIAGNOSIS — I83018 Varicose veins of right lower extremity with ulcer other part of lower leg: Secondary | ICD-10-CM | POA: Diagnosis not present

## 2018-02-06 DIAGNOSIS — Z79899 Other long term (current) drug therapy: Secondary | ICD-10-CM | POA: Diagnosis not present

## 2018-02-06 DIAGNOSIS — I8312 Varicose veins of left lower extremity with inflammation: Secondary | ICD-10-CM | POA: Diagnosis not present

## 2018-02-06 DIAGNOSIS — Z853 Personal history of malignant neoplasm of breast: Secondary | ICD-10-CM | POA: Diagnosis not present

## 2018-02-06 DIAGNOSIS — R2689 Other abnormalities of gait and mobility: Secondary | ICD-10-CM | POA: Diagnosis not present
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
## 2018-02-09 DIAGNOSIS — I10 Essential (primary) hypertension: Secondary | ICD-10-CM | POA: Diagnosis not present

## 2018-02-09 DIAGNOSIS — I8312 Varicose veins of left lower extremity with inflammation: Secondary | ICD-10-CM | POA: Diagnosis not present

## 2018-02-09 DIAGNOSIS — Z79899 Other long term (current) drug therapy: Secondary | ICD-10-CM | POA: Diagnosis not present

## 2018-02-09 DIAGNOSIS — Z853 Personal history of malignant neoplasm of breast: Secondary | ICD-10-CM | POA: Diagnosis not present

## 2018-02-09 DIAGNOSIS — R2689 Other abnormalities of gait and mobility: Secondary | ICD-10-CM | POA: Diagnosis not present

## 2018-02-09 DIAGNOSIS — I83018 Varicose veins of right lower extremity with ulcer other part of lower leg: Secondary | ICD-10-CM | POA: Diagnosis not present

## 2018-02-10 DIAGNOSIS — Z853 Personal history of malignant neoplasm of breast: Secondary | ICD-10-CM | POA: Diagnosis not present

## 2018-02-10 DIAGNOSIS — R2689 Other abnormalities of gait and mobility: Secondary | ICD-10-CM | POA: Diagnosis not present

## 2018-02-10 DIAGNOSIS — Z79899 Other long term (current) drug therapy: Secondary | ICD-10-CM | POA: Diagnosis not present

## 2018-02-10 DIAGNOSIS — I10 Essential (primary) hypertension: Secondary | ICD-10-CM | POA: Diagnosis not present

## 2018-02-10 DIAGNOSIS — I8312 Varicose veins of left lower extremity with inflammation: Secondary | ICD-10-CM | POA: Diagnosis not present

## 2018-02-10 DIAGNOSIS — I83018 Varicose veins of right lower extremity with ulcer other part of lower leg: Secondary | ICD-10-CM | POA: Diagnosis not present

## 2018-02-11 DIAGNOSIS — Z79899 Other long term (current) drug therapy: Secondary | ICD-10-CM | POA: Diagnosis not present

## 2018-02-11 DIAGNOSIS — I83018 Varicose veins of right lower extremity with ulcer other part of lower leg: Secondary | ICD-10-CM | POA: Diagnosis not present

## 2018-02-11 DIAGNOSIS — I10 Essential (primary) hypertension: Secondary | ICD-10-CM | POA: Diagnosis not present

## 2018-02-11 DIAGNOSIS — I8312 Varicose veins of left lower extremity with inflammation: Secondary | ICD-10-CM | POA: Diagnosis not present

## 2018-02-11 DIAGNOSIS — R2689 Other abnormalities of gait and mobility: Secondary | ICD-10-CM | POA: Diagnosis not present

## 2018-02-11 DIAGNOSIS — Z853 Personal history of malignant neoplasm of breast: Secondary | ICD-10-CM | POA: Diagnosis not present

## 2018-02-12 ENCOUNTER — Telehealth: Payer: Self-pay
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
## 2018-02-13 DIAGNOSIS — I8312 Varicose veins of left lower extremity with inflammation: Secondary | ICD-10-CM | POA: Diagnosis not present

## 2018-02-13 DIAGNOSIS — Z853 Personal history of malignant neoplasm of breast: Secondary | ICD-10-CM | POA: Diagnosis not present

## 2018-02-13 DIAGNOSIS — R2689 Other abnormalities of gait and mobility: Secondary | ICD-10-CM | POA: Diagnosis not present

## 2018-02-13 DIAGNOSIS — Z79899 Other long term (current) drug therapy: Secondary | ICD-10-CM | POA: Diagnosis not present

## 2018-02-13 DIAGNOSIS — I83018 Varicose veins of right lower extremity with ulcer other part of lower leg: Secondary | ICD-10-CM | POA: Diagnosis not present

## 2018-02-13 DIAGNOSIS — I10 Essential (primary) hypertension: Secondary | ICD-10-CM | POA: Diagnosis not present
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
## 2018-02-13 NOTE — Telephone Encounter (Signed)
Spoke with Olivia Mackie and she stated that she was concerned with the accusations that patient is stating. She stated that the Social worker saw her for an evaluation. Olivia Mackie suggested that patient come in to talk with PCP concerning issues and she stated that she would be seeing patient later today. Informed Olivia Mackie that I would give patient a call as soon as I could to see if I can get her to come in for an appointment.

## 2018-02-16 ENCOUNTER — Telehealth: Payer: Self-pay | Admitting: Family Medicine

## 2018-02-16 DIAGNOSIS — I8312 Varicose veins of left lower extremity with inflammation: Secondary | ICD-10-CM | POA: Diagnosis not present

## 2018-02-16 DIAGNOSIS — R2689 Other abnormalities of gait and mobility: Secondary | ICD-10-CM | POA: Diagnosis not present

## 2018-02-16 DIAGNOSIS — I83018 Varicose veins of right lower extremity with ulcer other part of lower leg: Secondary | ICD-10-CM | POA: Diagnosis not present

## 2018-02-16 DIAGNOSIS — Z853 Personal history of malignant neoplasm of breast: Secondary | ICD-10-CM | POA: Diagnosis not present

## 2018-02-16 DIAGNOSIS — I10 Essential (primary) hypertension: Secondary | ICD-10-CM | POA: Diagnosis not present

## 2018-02-16 DIAGNOSIS — Z79899 Other long term (current) drug therapy: Secondary | ICD-10-CM | POA: Diagnosis not present

## 2018-02-16 NOTE — Telephone Encounter (Signed)
Copied from [place] [id number]. Topic: Quick Communication - Home Health Verbal Orders >> Feb 16, 2018  3:42 PM Adelene Idler wrote: Caller/Agency: [facility] Callback Number: [phone] Requesting OT/PT/Skilled Nursing/Social Work: Social Work Frequency:Come in to eval  and treat patient

## 2018-02-17 DIAGNOSIS — I10 Essential (primary) hypertension: Secondary | ICD-10-CM | POA: Diagnosis not present

## 2018-02-17 DIAGNOSIS — Z79899 Other long term (current) drug therapy: Secondary | ICD-10-CM | POA: Diagnosis not present

## 2018-02-17 DIAGNOSIS — R2689 Other abnormalities of gait and mobility: Secondary | ICD-10-CM | POA: Diagnosis not present

## 2018-02-17 DIAGNOSIS — I8312 Varicose veins of left lower extremity with inflammation: Secondary | ICD-10-CM | POA: Diagnosis not present

## 2018-02-17 DIAGNOSIS — I83018 Varicose veins of right lower extremity with ulcer other part of lower leg: Secondary | ICD-10-CM | POA: Diagnosis not present

## 2018-02-17 DIAGNOSIS — Z853 Personal history of malignant neoplasm of breast: Secondary | ICD-10-CM | POA: Diagnosis not present

## 2018-02-17 NOTE — Telephone Encounter (Signed)
It is okay to give verbal authorization for evaluation of requested. Thanks, BJ

## 2018-02-18 DIAGNOSIS — Z79899 Other long term (current) drug therapy: Secondary | ICD-10-CM | POA: Diagnosis not present

## 2018-02-18 DIAGNOSIS — Z853 Personal history of malignant neoplasm of breast: Secondary | ICD-10-CM | POA: Diagnosis not present

## 2018-02-18 DIAGNOSIS — I10 Essential (primary) hypertension: Secondary | ICD-10-CM | POA: Diagnosis not present

## 2018-02-18 DIAGNOSIS — I83018 Varicose veins of right lower extremity with ulcer other part of lower leg: Secondary | ICD-10-CM | POA: Diagnosis not present

## 2018-02-18 DIAGNOSIS — R2689 Other abnormalities of gait and mobility: Secondary | ICD-10-CM | POA: Diagnosis not present

## 2018-02-18 DIAGNOSIS — I8312 Varicose veins of left lower extremity with inflammation: Secondary | ICD-10-CM | POA: Diagnosis not present

## 2018-02-18 NOTE — Telephone Encounter (Signed)
Spoke with Olivia Mackie from [facility], gave verbal orders as requested for OT/PT/Skilled Nursing/Social Work.

## 2018-02-20 DIAGNOSIS — Z79899 Other long term (current) drug therapy: Secondary | ICD-10-CM | POA: Diagnosis not present

## 2018-02-20 DIAGNOSIS — Z853 Personal history of malignant neoplasm of breast: Secondary | ICD-10-CM | POA: Diagnosis not present

## 2018-02-20 DIAGNOSIS — R2689 Other abnormalities of gait and mobility: Secondary | ICD-10-CM | POA: Diagnosis not present

## 2018-02-20 DIAGNOSIS — I10 Essential (primary) hypertension: Secondary | ICD-10-CM | POA: Diagnosis not present

## 2018-02-20 DIAGNOSIS — I83018 Varicose veins of right lower extremity with ulcer other part of lower leg: Secondary | ICD-10-CM | POA: Diagnosis not present

## 2018-02-20 DIAGNOSIS — I8312 Varicose veins of left lower extremity with inflammation: Secondary | ICD-10-CM | POA: Diagnosis not present

## 2018-02-23 DIAGNOSIS — I8312 Varicose veins of left lower extremity with inflammation: Secondary | ICD-10-CM | POA: Diagnosis not present

## 2018-02-23 DIAGNOSIS — I10 Essential (primary) hypertension: Secondary | ICD-10-CM | POA: Diagnosis not present

## 2018-02-23 DIAGNOSIS — Z853 Personal history of malignant neoplasm of breast: Secondary | ICD-10-CM | POA: Diagnosis not present

## 2018-02-23 DIAGNOSIS — Z79899 Other long term (current) drug therapy: Secondary | ICD-10-CM | POA: Diagnosis not present

## 2018-02-23 DIAGNOSIS — I83018 Varicose veins of right lower extremity with ulcer other part of lower leg: Secondary | ICD-10-CM | POA: Diagnosis not present

## 2018-02-23 DIAGNOSIS — R2689 Other abnormalities of gait and mobility: Secondary | ICD-10-CM | POA: Diagnosis not present

## 2018-02-24 DIAGNOSIS — H401132 Primary open-angle glaucoma, bilateral, moderate stage: Secondary | ICD-10-CM | POA: Diagnosis not present

## 2018-02-24 DIAGNOSIS — H43811 Vitreous degeneration, right eye: Secondary | ICD-10-CM | POA: Diagnosis not present

## 2018-02-24 DIAGNOSIS — Z961 Presence of intraocular lens: Secondary | ICD-10-CM | POA: Diagnosis not present

## 2018-02-24 DIAGNOSIS — H40013 Open angle with borderline findings, low risk, bilateral: Secondary | ICD-10-CM | POA: Diagnosis not present

## 2018-02-25 DIAGNOSIS — Z853 Personal history of malignant neoplasm of breast: Secondary | ICD-10-CM | POA: Diagnosis not present

## 2018-02-25 DIAGNOSIS — I8312 Varicose veins of left lower extremity with inflammation: Secondary | ICD-10-CM | POA: Diagnosis not present

## 2018-02-25 DIAGNOSIS — I10 Essential (primary) hypertension: Secondary | ICD-10-CM | POA: Diagnosis not present

## 2018-02-25 DIAGNOSIS — I83018 Varicose veins of right lower extremity with ulcer other part of lower leg: Secondary | ICD-10-CM | POA: Diagnosis not present

## 2018-02-25 DIAGNOSIS — Z79899 Other long term (current) drug therapy: Secondary | ICD-10-CM | POA: Diagnosis not present

## 2018-02-25 DIAGNOSIS — R2689 Other abnormalities of gait and mobility: Secondary | ICD-10-CM | POA: Diagnosis not present

## 2018-02-26 DIAGNOSIS — I10 Essential (primary) hypertension: Secondary | ICD-10-CM | POA: Diagnosis not present

## 2018-02-26 DIAGNOSIS — Z853 Personal history of malignant neoplasm of breast: Secondary | ICD-10-CM | POA: Diagnosis not present

## 2018-02-26 DIAGNOSIS — I83018 Varicose veins of right lower extremity with ulcer other part of lower leg: Secondary | ICD-10-CM | POA: Diagnosis not present

## 2018-02-26 DIAGNOSIS — R2689 Other abnormalities of gait and mobility: Secondary | ICD-10-CM | POA: Diagnosis not present

## 2018-02-26 DIAGNOSIS — I8312 Varicose veins of left lower extremity with inflammation: Secondary | ICD-10-CM | POA: Diagnosis not present

## 2018-02-26 DIAGNOSIS — Z79899 Other long term (current) drug therapy: Secondary | ICD-10-CM | POA: Diagnosis not present

## 2018-03-02 ENCOUNTER — Ambulatory Visit (INDEPENDENT_AMBULATORY_CARE_PROVIDER_SITE_OTHER): Payer: Medicare Other | Admitting: Podiatry

## 2018-03-02 DIAGNOSIS — R2689 Other abnormalities of gait and mobility: Secondary | ICD-10-CM | POA: Diagnosis not present

## 2018-03-02 DIAGNOSIS — L97519 Non-pressure chronic ulcer of other part of right foot with unspecified severity: Secondary | ICD-10-CM | POA: Diagnosis not present

## 2018-03-02 DIAGNOSIS — Z853 Personal history of malignant neoplasm of breast: Secondary | ICD-10-CM | POA: Diagnosis not present

## 2018-03-02 DIAGNOSIS — Z79899 Other long term (current) drug therapy: Secondary | ICD-10-CM | POA: Diagnosis not present

## 2018-03-02 DIAGNOSIS — I83015 Varicose veins of right lower extremity with ulcer other part of foot: Secondary | ICD-10-CM

## 2018-03-02 DIAGNOSIS — B351 Tinea unguium: Secondary | ICD-10-CM | POA: Diagnosis not present

## 2018-03-02 DIAGNOSIS — L97512 Non-pressure chronic ulcer of other part of right foot with fat layer exposed: Secondary | ICD-10-CM

## 2018-03-02 DIAGNOSIS — L97312 Non-pressure chronic ulcer of right ankle with fat layer exposed: Secondary | ICD-10-CM

## 2018-03-02 DIAGNOSIS — I10 Essential (primary) hypertension: Secondary | ICD-10-CM | POA: Diagnosis not present

## 2018-03-02 DIAGNOSIS — M79676 Pain in unspecified toe(s): Secondary | ICD-10-CM | POA: Diagnosis not present

## 2018-03-02 DIAGNOSIS — I83018 Varicose veins of right lower extremity with ulcer other part of lower leg: Secondary | ICD-10-CM | POA: Diagnosis not present

## 2018-03-02 DIAGNOSIS — I8312 Varicose veins of left lower extremity with inflammation: Secondary | ICD-10-CM | POA: Diagnosis not present

## 2018-03-03 NOTE — Progress Notes (Signed)
Subjective:  84 year old female presents today for follow up evaluation of ulcerations noted to the right lower extremity. She states the wounds look about the same. She denies any significant pain or modifying factors. Home health has been coming to her house for [employer] application. Patient is here for further evaluation and treatment.   Past Medical History:  Diagnosis Date  . Cataract   . Complete heart block (HCC)    s/p PPM implant (MDT) by Dr Blanch Media.  Atrial lead could not be paced at time of the procedure.  She has chronic AV dysociation  . Delusional disorder ([place])   . Exogenous obesity   . Hypercholesterolemia   . Invasive ductal carcinoma of breast ([place]) 03/2007   BILATERAL BREASTS  . Orthostatic hypotension    treated with midodrine by Dr Rollene [name]  . PONV (postoperative nausea and vomiting)   . Thyroid disease   . Venous insufficiency      Objective/Physical Exam General: The patient is alert and oriented x3 in no acute distress.  Dermatology:  Wound #1 noted to the lateral right ankle measuring 1.0 x 1.0 x 0.2 cm (LxWxD).   Wound #2 noted tot he right fourth toe measuring 0.5 x 0.5 x 0.1 cm.  To the noted ulceration(s), there is no eschar. There is a moderate amount of slough, fibrin, and necrotic tissue noted. Granulation tissue and wound base is red. There is a minimal amount of serosanguineous drainage noted. There is no exposed bone muscle-tendon ligament or joint. There is no malodor. Periwound integrity is intact. Skin is warm, dry and supple bilateral lower extremities.  Nails are tender, long, thickened and dystrophic with subungual debris, consistent with onychomycosis, 1-5 bilateral. No signs of infection noted.  Vascular: Palpable pedal pulses bilaterally. Capillary refill within normal limits. Varicosities noted bilateral lower extremities.   Neurological: Epicritic and protective threshold diminished bilaterally.   Musculoskeletal Exam: Range of  motion within normal limits to all pedal and ankle joints bilateral. Muscle strength 5/5 in all groups bilateral.    Assessment: #1 ulceration to the right lateral ankle secondary to venous insufficiency  #2 ulceration to the right fourth toe secondary to venous insufficiency  #3 varicosities bilateral lower extremities #4 Edema w/ erythema right lower extremity #5 Diabetes Mellitus w/ peripheral neuropathy #6 Onychomycosis of nail due to dermatophyte bilateral  Plan of Care:  #1 Patient was evaluated. #2 medically necessary excisional debridement including subcutaneous tissue was performed using a tissue nipper and a chisel blade. Excisional debridement of all the necrotic nonviable tissue down to healthy bleeding viable tissue was performed with post-debridement measurements same as pre-. #3 the wound was cleansed with normal saline and dry sterile dressing applied. #4 Continue home health care unna boot applications as per PCP, [name].  #5 Instructed to maintain good pedal hygiene and foot care. Stressed importance of controlling blood sugar.  #6 Mechanical debridement of nails 1-5 bilaterally performed using a nail nipper. Filed with dremel without incident.  #7 Return to clinic in 4 weeks.   Edrick Kins, DPM [facility]  Dr. Edrick Kins, [place]                                        [place], [place] [address]  Office [phone]  Fax [phone]

## 2018-03-05 DIAGNOSIS — Z79899 Other long term (current) drug therapy: Secondary | ICD-10-CM | POA: Diagnosis not present

## 2018-03-05 DIAGNOSIS — I10 Essential (primary) hypertension: Secondary | ICD-10-CM | POA: Diagnosis not present

## 2018-03-05 DIAGNOSIS — Z853 Personal history of malignant neoplasm of breast: Secondary | ICD-10-CM | POA: Diagnosis not present

## 2018-03-05 DIAGNOSIS — I8312 Varicose veins of left lower extremity with inflammation: Secondary | ICD-10-CM | POA: Diagnosis not present

## 2018-03-05 DIAGNOSIS — R2689 Other abnormalities of gait and mobility: Secondary | ICD-10-CM | POA: Diagnosis not present

## 2018-03-05 DIAGNOSIS — I83018 Varicose veins of right lower extremity with ulcer other part of lower leg: Secondary | ICD-10-CM | POA: Diagnosis not present

## 2018-03-09 DIAGNOSIS — Z79899 Other long term (current) drug therapy: Secondary | ICD-10-CM | POA: Diagnosis not present

## 2018-03-09 DIAGNOSIS — I8312 Varicose veins of left lower extremity with inflammation: Secondary | ICD-10-CM | POA: Diagnosis not present

## 2018-03-09 DIAGNOSIS — R2689 Other abnormalities of gait and mobility: Secondary | ICD-10-CM | POA: Diagnosis not present

## 2018-03-09 DIAGNOSIS — I10 Essential (primary) hypertension: Secondary | ICD-10-CM | POA: Diagnosis not present

## 2018-03-09 DIAGNOSIS — Z853 Personal history of malignant neoplasm of breast: Secondary | ICD-10-CM | POA: Diagnosis not present

## 2018-03-09 DIAGNOSIS — I83018 Varicose veins of right lower extremity with ulcer other part of lower leg: Secondary | ICD-10-CM | POA: Diagnosis not present

## 2018-03-11 DIAGNOSIS — I83018 Varicose veins of right lower extremity with ulcer other part of lower leg: Secondary | ICD-10-CM | POA: Diagnosis not present

## 2018-03-11 DIAGNOSIS — R2689 Other abnormalities of gait and mobility: Secondary | ICD-10-CM | POA: Diagnosis not present

## 2018-03-11 DIAGNOSIS — I8312 Varicose veins of left lower extremity with inflammation: Secondary | ICD-10-CM | POA: Diagnosis not present

## 2018-03-11 DIAGNOSIS — I10 Essential (primary) hypertension: Secondary | ICD-10-CM | POA: Diagnosis not present

## 2018-03-11 DIAGNOSIS — Z853 Personal history of malignant neoplasm of breast: Secondary | ICD-10-CM | POA: Diagnosis not present

## 2018-03-11 DIAGNOSIS — Z79899 Other long term (current) drug therapy: Secondary | ICD-10-CM | POA: Diagnosis not present

## 2018-03-19 DIAGNOSIS — Z853 Personal history of malignant neoplasm of breast: Secondary | ICD-10-CM | POA: Diagnosis not present

## 2018-03-19 DIAGNOSIS — Z79899 Other long term (current) drug therapy: Secondary | ICD-10-CM | POA: Diagnosis not present

## 2018-03-19 DIAGNOSIS — R2689 Other abnormalities of gait and mobility: Secondary | ICD-10-CM | POA: Diagnosis not present

## 2018-03-19 DIAGNOSIS — I8312 Varicose veins of left lower extremity with inflammation: Secondary | ICD-10-CM | POA: Diagnosis not present

## 2018-03-19 DIAGNOSIS — I10 Essential (primary) hypertension: Secondary | ICD-10-CM | POA: Diagnosis not present

## 2018-03-19 DIAGNOSIS — I83018 Varicose veins of right lower extremity with ulcer other part of lower leg: Secondary | ICD-10-CM | POA: Diagnosis not present

## 2018-03-20 ENCOUNTER — Encounter: Payer: Self-pay | Admitting: Family Medicine

## 2018-03-20 ENCOUNTER — Ambulatory Visit (INDEPENDENT_AMBULATORY_CARE_PROVIDER_SITE_OTHER): Payer: Medicare Other | Admitting: Family Medicine

## 2018-03-20 VITALS — BP 116/74 | HR 88 | Temp 97.6°F | Resp 16 | Ht 67.0 in | Wt 211.0 lb

## 2018-03-20 DIAGNOSIS — F419 Anxiety disorder, unspecified: Secondary | ICD-10-CM

## 2018-03-20 DIAGNOSIS — Z23 Encounter for immunization: Secondary | ICD-10-CM

## 2018-03-20 DIAGNOSIS — L97912 Non-pressure chronic ulcer of unspecified part of right lower leg with fat layer exposed: Secondary | ICD-10-CM | POA: Diagnosis not present

## 2018-03-20 DIAGNOSIS — I83893 Varicose veins of bilateral lower extremities with other complications: Secondary | ICD-10-CM | POA: Diagnosis not present

## 2018-03-20 DIAGNOSIS — E039 Hypothyroidism, unspecified: Secondary | ICD-10-CM

## 2018-03-20 MED ORDER — LEVOTHYROXINE SODIUM 50 MCG PO TABS: 50.0000 ug | ORAL_TABLET | Freq: Every day | ORAL | 2 refills | Status: DC

## 2018-03-20 NOTE — Patient Instructions (Addendum)
A few things to remember from today's visit:   Hypothyroidism, unspecified type  Anxiety disorder, unspecified type  Ulcer of right lower extremity with fat layer exposed ([place])  Varicose veins of bilateral lower extremities with other complications  No changes today.  Please be sure medication list is accurate. If a new problem present, please set up appointment sooner than planned today.

## 2018-03-20 NOTE — Progress Notes (Signed)
HPI:   [name] is a 84 y.o. female, who is here today for 2 months follow up.   She was last seen on 01/19/18  Since her last OV she has followed with podiatrist, 03/02/2018. RLE venous ulcer lateral aspect of ankle and right 4th toe. She has [place] arrangements,wound care ,PT, and social work evaluation.  On 01/09/18 she was concerned about somebody following her (got licence plates #'s) and getting into her house when she was not there,moving things around. She voiced this concern again on 01/19/18, so [facility] referral was placed to evaluate and address needs and concerns.  She states that she is not longer being followed and nobody has been in her house for a few weeks now. But she still keeps licence plates information in case it happens again.  She has been evaluated by behavioral health early this year but refused treatment,not interested in following. She is feeling much better. Anxiety has also improved. Denies depressed mood.  She is very satisfied with [facility] care. She feels safer at home.  She attends church,planning on cooking a dish for next service.  -LE edema has also improved,she decreased Furosemide dose to 10 mg. She has applied topical triamcinolone,which has helped with skin erythema. No fever,chills,orthopnea,or PND.  She does not cook much, she gets take out and a few times per week and divided in portions that last all week.  Hypothyroidism: She is on Levothyroxine 50 mcg daily. Last TSH was 3.6 01/09/18. No palpitations,chnages in bowel habits, or abnormal wt loss.   She follows with cardiologist annually. She has appt coming to see Dr Peter Martinique. Complete heart block , s/p pacemaker placement in 2014. It is checked from home, last time in 12/2017.     Review of Systems  Constitutional: Negative for activity change, appetite change, fatigue and fever.  HENT: Negative for mouth sores and nosebleeds.   Eyes: Negative for redness and visual  disturbance.  Respiratory: Negative for cough, shortness of breath and wheezing.   Cardiovascular: Positive for leg swelling. Negative for chest pain and palpitations.  Gastrointestinal: Negative for abdominal pain, nausea and vomiting.       Negative for changes in bowel habits.  Genitourinary: Negative for decreased urine volume, dysuria and hematuria.  Musculoskeletal: Positive for gait problem.  Neurological: Negative for syncope, weakness and headaches.  Psychiatric/Behavioral: Negative for confusion and hallucinations. The patient is nervous/anxious.       Current Outpatient Medications on File Prior to Visit  Medication Sig Dispense Refill  . Calcium Carbonate-Vitamin D (CALTRATE 600+D PO) Take 600 mg by mouth daily after breakfast.    . furosemide (LASIX) 20 MG tablet Take 1 tablet (20 mg total) by mouth daily. 30 tablet 1  . gentamicin cream (GARAMYCIN) 0.1 % Apply 1 application topically 3 (three) times daily. 30 g 1  . latanoprost (XALATAN) 0.005 % ophthalmic solution Place 1 drop into both eyes at bedtime.   10  . letrozole (FEMARA) 2.5 MG tablet Take 1 tablet (2.5 mg total) by mouth daily. (Patient taking differently: Take 2.5 mg by mouth daily with breakfast. ) 90 tablet 3  . lipase/protease/amylase (CREON) 36000 UNITS CPEP capsule Take 36,000 Units by mouth 3 (three) times daily before meals.    . midodrine (PROAMATINE) 2.5 MG tablet Take 2.5 mg by mouth daily after breakfast.     . triamcinolone ointment (KENALOG) 0.1 % Apply 1 application topically daily as needed. Lower extremities. 45 g 1  No current facility-administered medications on file prior to visit.      Past Medical History:  Diagnosis Date  . Cataract   . Complete heart block (HCC)    s/p PPM implant (MDT) by Dr Blanch Media.  Atrial lead could not be paced at time of the procedure.  She has chronic AV dysociation  . Delusional disorder ([place])   . Exogenous obesity   . Hypercholesterolemia   . Invasive  ductal carcinoma of breast ([place]) 03/2007   BILATERAL BREASTS  . Orthostatic hypotension    treated with midodrine by Dr Rollene [name]  . PONV (postoperative nausea and vomiting)   . Thyroid disease   . Venous insufficiency    Allergies  Allergen Reactions  . Amoxicillin-Pot Clavulanate Diarrhea and Nausea And Vomiting    Has patient had a PCN reaction causing immediate rash, facial/tongue/throat swelling, SOB or lightheadedness with hypotension: Yes Has patient had a PCN reaction causing severe rash involving mucus membranes or skin necrosis: No Has patient had a PCN reaction that required hospitalization: No Has patient had a PCN reaction occurring within the last 10 years: Yes If all of the above answers are "NO", then may proceed with Cephalosporin use.   . Erythromycin Hives  . Tape Other (See Comments)    BAND AIDS-SKIN IRRITATION  . Codeine Nausea And Vomiting  . Flagyl [Metronidazole] Hives  . Macrobid [Nitrofurantoin Macrocrystal] Nausea And Vomiting  . Tolterodine Nausea And Vomiting  . Ciprofloxacin Hives and Rash    Social History   Socioeconomic History  . Marital status: Widowed    Spouse name: Not on file  . Number of children: Not on file  . Years of education: Not on file  . Highest education level: Not on file  Occupational History  . Not on file  Social Needs  . Financial resource strain: Not on file  . Food insecurity:    Worry: Not on file    Inability: Not on file  . Transportation needs:    Medical: Not on file    Non-medical: Not on file  Tobacco Use  . Smoking status: Never Smoker  . Smokeless tobacco: Never Used  Substance and Sexual Activity  . Alcohol use: No    Alcohol/week: 0.0 standard drinks  . Drug use: No  . Sexual activity: Not Currently  Lifestyle  . Physical activity:    Days per week: Not on file    Minutes per session: Not on file  . Stress: Not on file  Relationships  . Social connections:    Talks on phone: Not on file     Gets together: Not on file    Attends religious service: Not on file    Active member of club or organization: Not on file    Attends meetings of clubs or organizations: Not on file    Relationship status: Not on file  Other Topics Concern  . Not on file  Social History Narrative  . Not on file    Vitals:   03/20/18 0934  BP: 116/74  Pulse: 88  Resp: 16  Temp: 97.6 F (36.4 C)  SpO2: 96%   Body mass index is 33.05 kg/m.  Physical Exam  Nursing note and vitals reviewed. Constitutional: She is oriented to person, place, and time. She appears well-developed. No distress.  HENT:  Head: Normocephalic and atraumatic.  Mouth/Throat: Oropharynx is clear and moist and mucous membranes are normal.  Eyes: Pupils are equal, round, and reactive to light. Conjunctivae are normal.  Cardiovascular: Normal rate and regular rhythm.  No murmur heard. DP pulses present.  Respiratory: Effort normal and breath sounds normal. No respiratory distress.  GI: Soft. She exhibits no mass. There is no hepatomegaly. There is no tenderness.  Musculoskeletal: She exhibits edema (1+ pitting LE edema,bilateral.).  Lymphadenopathy:    She has no cervical adenopathy.  Neurological: She is alert and oriented to person, place, and time. She has normal strength. No cranial nerve deficit. Gait normal.  Skin: Skin is warm. No rash noted. There is erythema (Mild, pretibial).  Right ankle bandage covering ulcers.  Psychiatric: She has a normal mood and affect.  Well groomed, good eye contact.       ASSESSMENT AND PLAN:   [name] was seen today for 2 months follow-up.  Orders Placed This Encounter  Procedures  . Pneumococcal conjugate vaccine 13-valent IM     Varicose veins of bilateral lower extremities with other complications Improved. LE elevation and/or compression stocking recommended. Good skin care. Continue topical steroid. Instructed about warning signs. F/U in 8  months.  Hypothyroidism, unspecified type Well controlled with Levothyroxine 50 mcg daily. No changes in current management. F/U in 8 months.  -     levothyroxine (SYNTHROID, LEVOTHROID) 50 MCG tablet; Take 1 tablet (50 mcg total) by mouth daily before breakfast.  Anxiety disorder, unspecified type Improved some. No hallucinations or paranoia. She is not interested in following with psychiatrist or try medication.  Ulcer of right lower extremity with fat layer exposed ([place]) Stable. Continue following with Dr Paulla Dolly appt 04/06/18.  Need for vaccination against Streptococcus pneumoniae using pneumococcal conjugate vaccine 13 -     Pneumococcal conjugate vaccine 13-valent IM    Nell Gales G. Martinique, MD  [facility]. [place] office.

## 2018-03-21 DIAGNOSIS — L97811 Non-pressure chronic ulcer of other part of right lower leg limited to breakdown of skin: Secondary | ICD-10-CM | POA: Diagnosis not present

## 2018-03-21 DIAGNOSIS — I83018 Varicose veins of right lower extremity with ulcer other part of lower leg: Secondary | ICD-10-CM | POA: Diagnosis not present

## 2018-03-21 DIAGNOSIS — I10 Essential (primary) hypertension: Secondary | ICD-10-CM | POA: Diagnosis not present

## 2018-03-21 DIAGNOSIS — Z853 Personal history of malignant neoplasm of breast: Secondary | ICD-10-CM | POA: Diagnosis not present

## 2018-03-21 DIAGNOSIS — L97511 Non-pressure chronic ulcer of other part of right foot limited to breakdown of skin: Secondary | ICD-10-CM | POA: Diagnosis not present

## 2018-03-21 DIAGNOSIS — I8312 Varicose veins of left lower extremity with inflammation: Secondary | ICD-10-CM | POA: Diagnosis not present

## 2018-03-25 DIAGNOSIS — I83018 Varicose veins of right lower extremity with ulcer other part of lower leg: Secondary | ICD-10-CM | POA: Diagnosis not present

## 2018-03-25 DIAGNOSIS — I10 Essential (primary) hypertension: Secondary | ICD-10-CM | POA: Diagnosis not present

## 2018-03-25 DIAGNOSIS — Z853 Personal history of malignant neoplasm of breast: Secondary | ICD-10-CM | POA: Diagnosis not present

## 2018-03-25 DIAGNOSIS — L97811 Non-pressure chronic ulcer of other part of right lower leg limited to breakdown of skin: Secondary | ICD-10-CM | POA: Diagnosis not present

## 2018-03-25 DIAGNOSIS — L97511 Non-pressure chronic ulcer of other part of right foot limited to breakdown of skin: Secondary | ICD-10-CM | POA: Diagnosis not present

## 2018-03-25 DIAGNOSIS — I8312 Varicose veins of left lower extremity with inflammation: Secondary | ICD-10-CM | POA: Diagnosis not present

## 2018-03-27 DIAGNOSIS — I8312 Varicose veins of left lower extremity with inflammation: Secondary | ICD-10-CM | POA: Diagnosis not present

## 2018-03-27 DIAGNOSIS — L97811 Non-pressure chronic ulcer of other part of right lower leg limited to breakdown of skin: Secondary | ICD-10-CM | POA: Diagnosis not present

## 2018-03-27 DIAGNOSIS — I10 Essential (primary) hypertension: Secondary | ICD-10-CM | POA: Diagnosis not present

## 2018-03-27 DIAGNOSIS — I83018 Varicose veins of right lower extremity with ulcer other part of lower leg: Secondary | ICD-10-CM | POA: Diagnosis not present

## 2018-03-27 DIAGNOSIS — L97511 Non-pressure chronic ulcer of other part of right foot limited to breakdown of skin: Secondary | ICD-10-CM | POA: Diagnosis not present

## 2018-03-27 DIAGNOSIS — Z853 Personal history of malignant neoplasm of breast: Secondary | ICD-10-CM | POA: Diagnosis not present

## 2018-03-30 DIAGNOSIS — L97811 Non-pressure chronic ulcer of other part of right lower leg limited to breakdown of skin: Secondary | ICD-10-CM | POA: Diagnosis not present

## 2018-03-30 DIAGNOSIS — L97511 Non-pressure chronic ulcer of other part of right foot limited to breakdown of skin: Secondary | ICD-10-CM | POA: Diagnosis not present

## 2018-03-30 DIAGNOSIS — I10 Essential (primary) hypertension: Secondary | ICD-10-CM | POA: Diagnosis not present

## 2018-03-30 DIAGNOSIS — I83018 Varicose veins of right lower extremity with ulcer other part of lower leg: Secondary | ICD-10-CM | POA: Diagnosis not present

## 2018-03-30 DIAGNOSIS — I8312 Varicose veins of left lower extremity with inflammation: Secondary | ICD-10-CM | POA: Diagnosis not present

## 2018-03-30 DIAGNOSIS — Z853 Personal history of malignant neoplasm of breast: Secondary | ICD-10-CM | POA: Diagnosis not present

## 2018-04-01 DIAGNOSIS — I83018 Varicose veins of right lower extremity with ulcer other part of lower leg: Secondary | ICD-10-CM | POA: Diagnosis not present

## 2018-04-01 DIAGNOSIS — I8312 Varicose veins of left lower extremity with inflammation: Secondary | ICD-10-CM | POA: Diagnosis not present

## 2018-04-01 DIAGNOSIS — I10 Essential (primary) hypertension: Secondary | ICD-10-CM | POA: Diagnosis not present

## 2018-04-01 DIAGNOSIS — L97811 Non-pressure chronic ulcer of other part of right lower leg limited to breakdown of skin: Secondary | ICD-10-CM | POA: Diagnosis not present

## 2018-04-01 DIAGNOSIS — L97511 Non-pressure chronic ulcer of other part of right foot limited to breakdown of skin: Secondary | ICD-10-CM | POA: Diagnosis not present

## 2018-04-01 DIAGNOSIS — Z853 Personal history of malignant neoplasm of breast: Secondary | ICD-10-CM | POA: Diagnosis not present

## 2018-04-03 DIAGNOSIS — I83018 Varicose veins of right lower extremity with ulcer other part of lower leg: Secondary | ICD-10-CM | POA: Diagnosis not present

## 2018-04-03 DIAGNOSIS — L97511 Non-pressure chronic ulcer of other part of right foot limited to breakdown of skin: Secondary | ICD-10-CM | POA: Diagnosis not present

## 2018-04-03 DIAGNOSIS — L97811 Non-pressure chronic ulcer of other part of right lower leg limited to breakdown of skin: Secondary | ICD-10-CM | POA: Diagnosis not present

## 2018-04-03 DIAGNOSIS — I8312 Varicose veins of left lower extremity with inflammation: Secondary | ICD-10-CM | POA: Diagnosis not present

## 2018-04-03 DIAGNOSIS — Z853 Personal history of malignant neoplasm of breast: Secondary | ICD-10-CM | POA: Diagnosis not present

## 2018-04-03 DIAGNOSIS — I10 Essential (primary) hypertension: Secondary | ICD-10-CM | POA: Diagnosis not present

## 2018-04-06 ENCOUNTER — Ambulatory Visit (INDEPENDENT_AMBULATORY_CARE_PROVIDER_SITE_OTHER): Payer: Medicare Other | Admitting: Podiatry

## 2018-04-06 ENCOUNTER — Encounter: Payer: Self-pay | Admitting: Podiatry

## 2018-04-06 DIAGNOSIS — L97312 Non-pressure chronic ulcer of right ankle with fat layer exposed: Secondary | ICD-10-CM

## 2018-04-06 DIAGNOSIS — I83015 Varicose veins of right lower extremity with ulcer other part of foot: Secondary | ICD-10-CM

## 2018-04-06 DIAGNOSIS — L97512 Non-pressure chronic ulcer of other part of right foot with fat layer exposed: Secondary | ICD-10-CM

## 2018-04-06 DIAGNOSIS — L97519 Non-pressure chronic ulcer of other part of right foot with unspecified severity: Secondary | ICD-10-CM

## 2018-04-07 NOTE — Progress Notes (Signed)
   Subjective:  84 year old female presents today for follow up evaluation of ulcerations noted to the right lower extremity. She states her home health nurse believes the wounds are healing. She denies any modifying factors. Home health has been changing her dressings three times weekly. Patient is here for further evaluation and treatment.   Past Medical History:  Diagnosis Date  . Cataract   . Complete heart block (HCC)    s/p PPM implant (MDT) by Dr Blanch Media.  Atrial lead could not be paced at time of the procedure.  She has chronic AV dysociation  . Delusional disorder ([place])   . Exogenous obesity   . Hypercholesterolemia   . Invasive ductal carcinoma of breast ([place]) 03/2007   BILATERAL BREASTS  . Orthostatic hypotension    treated with midodrine by Dr Rollene [name]  . PONV (postoperative nausea and vomiting)   . Thyroid disease   . Venous insufficiency      Objective/Physical Exam General: The patient is alert and oriented x3 in no acute distress.  Dermatology:  Wound #1 noted to the lateral right ankle measuring 1.0 x 0.6 x 0.2 cm (LxWxD).   Wound #2 noted to the right fourth toe measuring 0.5 x 0.5 x 0.2 cm.  Wound #3 noted to the right plantar forefoot measuring 1.0 x 1.0 x 0.1 cm.   To the noted ulceration(s), there is no eschar. There is a moderate amount of slough, fibrin, and necrotic tissue noted. Granulation tissue and wound base is red. There is a minimal amount of serosanguineous drainage noted. There is no exposed bone muscle-tendon ligament or joint. There is no malodor. Periwound integrity is intact. Skin is warm, dry and supple bilateral lower extremities.  Vascular: Palpable pedal pulses bilaterally. Capillary refill within normal limits. Varicosities noted bilateral lower extremities.   Neurological: Epicritic and protective threshold diminished bilaterally.   Musculoskeletal Exam: Range of motion within normal limits to all pedal and ankle joints bilateral.  Muscle strength 5/5 in all groups bilateral.    Assessment: #1 ulceration to the right lateral ankle secondary to venous insufficiency  #2 ulceration to the right fourth toe secondary to venous insufficiency  #3 ulceration to the right plantar forefoot secondary to venous insufficiency  #4 Diabetes Mellitus w/ peripheral neuropathy #5 Varicosities noted to bilateral lower extremities     Plan of Care:  #1 Patient was evaluated. #2 medically necessary excisional debridement including subcutaneous tissue was performed using a tissue nipper and a chisel blade. Excisional debridement of all the necrotic nonviable tissue down to healthy bleeding viable tissue was performed with post-debridement measurements same as pre-. #3 the wound was cleansed with normal saline and dry sterile dressing applied. #4 Continue home health care dressing changes Monday, Wednesday and Friday.   #5 Continue home health care unna boot applications as per PCP, [name].  #6 Resume using post op shoe.  #7 Return to clinic in 4 weeks.   Edrick Kins, DPM [facility]  Dr. Edrick Kins, [address]                                        [place], [place] [address]                Office [phone]  Fax [phone]

## 2018-04-08 DIAGNOSIS — I10 Essential (primary) hypertension: Secondary | ICD-10-CM | POA: Diagnosis not present

## 2018-04-08 DIAGNOSIS — I8312 Varicose veins of left lower extremity with inflammation: Secondary | ICD-10-CM | POA: Diagnosis not present

## 2018-04-08 DIAGNOSIS — Z853 Personal history of malignant neoplasm of breast: Secondary | ICD-10-CM | POA: Diagnosis not present

## 2018-04-08 DIAGNOSIS — I83018 Varicose veins of right lower extremity with ulcer other part of lower leg: Secondary | ICD-10-CM | POA: Diagnosis not present

## 2018-04-08 DIAGNOSIS — L97811 Non-pressure chronic ulcer of other part of right lower leg limited to breakdown of skin: Secondary | ICD-10-CM | POA: Diagnosis not present

## 2018-04-08 DIAGNOSIS — L97511 Non-pressure chronic ulcer of other part of right foot limited to breakdown of skin: Secondary | ICD-10-CM | POA: Diagnosis not present

## 2018-04-10 DIAGNOSIS — L97511 Non-pressure chronic ulcer of other part of right foot limited to breakdown of skin: Secondary | ICD-10-CM | POA: Diagnosis not present

## 2018-04-10 DIAGNOSIS — I10 Essential (primary) hypertension: Secondary | ICD-10-CM | POA: Diagnosis not present

## 2018-04-10 DIAGNOSIS — Z853 Personal history of malignant neoplasm of breast: Secondary | ICD-10-CM | POA: Diagnosis not present

## 2018-04-10 DIAGNOSIS — L97811 Non-pressure chronic ulcer of other part of right lower leg limited to breakdown of skin: Secondary | ICD-10-CM | POA: Diagnosis not present

## 2018-04-10 DIAGNOSIS — I83018 Varicose veins of right lower extremity with ulcer other part of lower leg: Secondary | ICD-10-CM | POA: Diagnosis not present

## 2018-04-10 DIAGNOSIS — I8312 Varicose veins of left lower extremity with inflammation: Secondary | ICD-10-CM | POA: Diagnosis not present

## 2018-04-16 DIAGNOSIS — Z853 Personal history of malignant neoplasm of breast: Secondary | ICD-10-CM | POA: Diagnosis not present

## 2018-04-16 DIAGNOSIS — I10 Essential (primary) hypertension: Secondary | ICD-10-CM | POA: Diagnosis not present

## 2018-04-16 DIAGNOSIS — L97811 Non-pressure chronic ulcer of other part of right lower leg limited to breakdown of skin: Secondary | ICD-10-CM | POA: Diagnosis not present

## 2018-04-16 DIAGNOSIS — L97511 Non-pressure chronic ulcer of other part of right foot limited to breakdown of skin: Secondary | ICD-10-CM | POA: Diagnosis not present

## 2018-04-16 DIAGNOSIS — I83018 Varicose veins of right lower extremity with ulcer other part of lower leg: Secondary | ICD-10-CM | POA: Diagnosis not present

## 2018-04-16 DIAGNOSIS — I8312 Varicose veins of left lower extremity with inflammation: Secondary | ICD-10-CM | POA: Diagnosis not present

## 2018-04-23 DIAGNOSIS — I83018 Varicose veins of right lower extremity with ulcer other part of lower leg: Secondary | ICD-10-CM | POA: Diagnosis not present

## 2018-04-23 DIAGNOSIS — Z853 Personal history of malignant neoplasm of breast: Secondary | ICD-10-CM | POA: Diagnosis not present

## 2018-04-23 DIAGNOSIS — L97511 Non-pressure chronic ulcer of other part of right foot limited to breakdown of skin: Secondary | ICD-10-CM | POA: Diagnosis not present

## 2018-04-23 DIAGNOSIS — L97811 Non-pressure chronic ulcer of other part of right lower leg limited to breakdown of skin: Secondary | ICD-10-CM | POA: Diagnosis not present

## 2018-04-23 DIAGNOSIS — I10 Essential (primary) hypertension: Secondary | ICD-10-CM | POA: Diagnosis not present

## 2018-04-23 DIAGNOSIS — I8312 Varicose veins of left lower extremity with inflammation: Secondary | ICD-10-CM | POA: Diagnosis not present

## 2018-04-27 ENCOUNTER — Ambulatory Visit (INDEPENDENT_AMBULATORY_CARE_PROVIDER_SITE_OTHER): Payer: Medicare Other

## 2018-04-27 DIAGNOSIS — I442 Atrioventricular block, complete: Secondary | ICD-10-CM

## 2018-04-27 NOTE — Progress Notes (Signed)
Remote pacemaker transmission.   

## 2018-04-28 DIAGNOSIS — I8312 Varicose veins of left lower extremity with inflammation: Secondary | ICD-10-CM | POA: Diagnosis not present

## 2018-04-28 DIAGNOSIS — L97811 Non-pressure chronic ulcer of other part of right lower leg limited to breakdown of skin: Secondary | ICD-10-CM | POA: Diagnosis not present

## 2018-04-28 DIAGNOSIS — I10 Essential (primary) hypertension: Secondary | ICD-10-CM | POA: Diagnosis not present

## 2018-04-28 DIAGNOSIS — L97511 Non-pressure chronic ulcer of other part of right foot limited to breakdown of skin: Secondary | ICD-10-CM | POA: Diagnosis not present

## 2018-04-28 DIAGNOSIS — I83018 Varicose veins of right lower extremity with ulcer other part of lower leg: Secondary | ICD-10-CM | POA: Diagnosis not present

## 2018-04-28 DIAGNOSIS — Z853 Personal history of malignant neoplasm of breast: Secondary | ICD-10-CM | POA: Diagnosis not present

## 2018-04-28 LAB — CUP PACEART REMOTE DEVICE CHECK
Battery Impedance: 888 Ohm
Battery Remaining Longevity: 63 mo
Battery Voltage: 2.77 V
Brady Statistic RV Percent Paced: 100 %
Implantable Lead Implant Date: 20141208
Implantable Lead Model: 4076
Lead Channel Impedance Value: 0 Ohm
Lead Channel Impedance Value: 392 Ohm
Lead Channel Pacing Threshold Amplitude: 0.875 V
Lead Channel Pacing Threshold Pulse Width: 0.4 ms
Lead Channel Setting Pacing Pulse Width: 0.46 ms
Lead Channel Setting Sensing Sensitivity: 2 mV
MDC IDC LEAD LOCATION: 753860
MDC IDC PG IMPLANT DT: 20161027
MDC IDC SESS DTM: 20191230141616
MDC IDC SET LEADCHNL RV PACING AMPLITUDE: 2 V

## 2018-05-04 ENCOUNTER — Encounter: Payer: Self-pay | Admitting: Podiatry

## 2018-05-04 ENCOUNTER — Ambulatory Visit (INDEPENDENT_AMBULATORY_CARE_PROVIDER_SITE_OTHER): Payer: Medicare Other | Admitting: Podiatry

## 2018-05-04 DIAGNOSIS — L97519 Non-pressure chronic ulcer of other part of right foot with unspecified severity: Secondary | ICD-10-CM | POA: Diagnosis not present

## 2018-05-04 DIAGNOSIS — L97512 Non-pressure chronic ulcer of other part of right foot with fat layer exposed: Secondary | ICD-10-CM

## 2018-05-04 DIAGNOSIS — I83015 Varicose veins of right lower extremity with ulcer other part of foot: Secondary | ICD-10-CM | POA: Diagnosis not present

## 2018-05-04 DIAGNOSIS — L97312 Non-pressure chronic ulcer of right ankle with fat layer exposed: Secondary | ICD-10-CM

## 2018-05-05 ENCOUNTER — Telehealth: Payer: Self-pay | Admitting: Family Medicine

## 2018-05-05 ENCOUNTER — Other Ambulatory Visit: Payer: Self-pay | Admitting: *Deleted

## 2018-05-05 DIAGNOSIS — E039 Hypothyroidism, unspecified: Secondary | ICD-10-CM

## 2018-05-05 MED ORDER — LEVOTHYROXINE SODIUM 50 MCG PO TABS: 50.0000 ug | ORAL_TABLET | Freq: Every day | ORAL | 2 refills | Status: DC

## 2018-05-05 NOTE — Telephone Encounter (Signed)
Rx sent to pharmacy as requested.

## 2018-05-05 NOTE — Telephone Encounter (Signed)
Copied from [place] [id number]. Topic: Quick Communication - See Telephone Encounter >> May 05, 2018  8:49 AM Conception Chancy, NT wrote: CRM for notification. See Telephone encounter for: 05/05/18.  Patient is calling and states that levothyroxine (SYNTHROID, LEVOTHROID) 50 MCG tablet  was canceled and contacted [facility] and was told that [name] would need to send another prescription in.  [facility] [place] [facility] [address] [place] [address] Phone: [phone] Fax: [phone]

## 2018-05-06 DIAGNOSIS — Z853 Personal history of malignant neoplasm of breast: Secondary | ICD-10-CM | POA: Diagnosis not present

## 2018-05-06 DIAGNOSIS — L97511 Non-pressure chronic ulcer of other part of right foot limited to breakdown of skin: Secondary | ICD-10-CM | POA: Diagnosis not present

## 2018-05-06 DIAGNOSIS — I10 Essential (primary) hypertension: Secondary | ICD-10-CM | POA: Diagnosis not present

## 2018-05-06 DIAGNOSIS — L97811 Non-pressure chronic ulcer of other part of right lower leg limited to breakdown of skin: Secondary | ICD-10-CM | POA: Diagnosis not present

## 2018-05-06 DIAGNOSIS — I83018 Varicose veins of right lower extremity with ulcer other part of lower leg: Secondary | ICD-10-CM | POA: Diagnosis not present

## 2018-05-06 DIAGNOSIS — I8312 Varicose veins of left lower extremity with inflammation: Secondary | ICD-10-CM | POA: Diagnosis not present

## 2018-05-06 NOTE — Progress Notes (Signed)
   Subjective:  85 year old female presents today for follow up evaluation of ulcerations noted to the right lower extremity. She states she is doing well. She denies any significant change in the ulcerations or any modifying factors. Home health has been changing her dressings three times per week. Patient is here for further evaluation and treatment.   Past Medical History:  Diagnosis Date  . Cataract   . Complete heart block (HCC)    s/p PPM implant (MDT) by Dr Blanch Media.  Atrial lead could not be paced at time of the procedure.  She has chronic AV dysociation  . Delusional disorder ([place])   . Exogenous obesity   . Hypercholesterolemia   . Invasive ductal carcinoma of breast ([place]) 03/2007   BILATERAL BREASTS  . Orthostatic hypotension    treated with midodrine by Dr Rollene [name]  . PONV (postoperative nausea and vomiting)   . Thyroid disease   . Venous insufficiency      Objective/Physical Exam General: The patient is alert and oriented x3 in no acute distress.  Dermatology:  Wound #1 noted to the lateral right ankle measuring 1.2 x 0.6 x 0.2 cm (LxWxD).   Wound #2 noted to the right fourth toe measuring 0.3 x 0.3 x 0.2 cm.  To the noted ulceration(s), there is no eschar. There is a moderate amount of slough, fibrin, and necrotic tissue noted. Granulation tissue and wound base is red. There is a minimal amount of serosanguineous drainage noted. There is no exposed bone muscle-tendon ligament or joint. There is no malodor. Periwound integrity is intact. Skin is warm, dry and supple bilateral lower extremities.  Wound noted to the right plantar forefoot has healed. Complete re-epithelialization has occurred. No drainage noted.   Vascular: Palpable pedal pulses bilaterally. Capillary refill within normal limits. Varicosities noted bilateral lower extremities.   Neurological: Epicritic and protective threshold diminished bilaterally.   Musculoskeletal Exam: Range of motion within  normal limits to all pedal and ankle joints bilateral. Muscle strength 5/5 in all groups bilateral.    Assessment: #1 ulceration to the right lateral ankle secondary to venous insufficiency  #2 ulceration to the right fourth toe secondary to venous insufficiency  #3 ulceration to the right plantar forefoot secondary to venous insufficiency - healed #4 Diabetes Mellitus w/ peripheral neuropathy #5 Varicosities noted to bilateral lower extremities     Plan of Care:  #1 Patient was evaluated. #2 medically necessary excisional debridement including subcutaneous tissue was performed using a tissue nipper and a chisel blade. Excisional debridement of all the necrotic nonviable tissue down to healthy bleeding viable tissue was performed with post-debridement measurements same as pre-. #3 the wounds were cleansed with normal saline and dry sterile dressing applied. #4 Continue home health care dressing changes Monday, Wednesday and Friday.   #5 Continue home health care unna boot applications as per PCP, [name].  #6 Return to clinic in 4 weeks.   Edrick Kins, DPM [facility]  Dr. Edrick Kins, [address]                                        [place], [place] [address]                Office [phone]  Fax [phone]

## 2018-05-09 NOTE — Progress Notes (Signed)
[name] Date of Birth: 29-Mar-1928 Medical Record #[id number]  History of Present Illness: [name] is seen today for follow up of complete heart block.  She has a history of complete heart block with 12 second pause and underwent permanent pacemaker implant on 10/09/2007 with a Medtronic adapta VVI pacemaker. She had revision of her pacemaker on February 23, 2015. She was admitted at that time with a GI bleed felt to be diverticular. No recurrence.  She also has a history of orthostatic dizziness managed with midodrine. She has no history of coronary disease or congestive heart failure. Last pacemaker check in December 2019 was normal.  She was admitted in June 2018 with acute diverticulitis. Treated with hydration and antibiotics. Admitted in November and December 2018 with hematochezia. Felt to be diverticular bleed. Conservative therapy recommended. ASA discontinued.   She has a history of chronic nonhealing wound on her right leg. She was seen in September in the ED with complaints of asymmetric leg swelling. Doppler negative for DVT. Treated with doxycycline for possible cellulitis. Now has right ankle wrapped.  On follow up today she is doing well from a cardiac standpoint. Has been taking low dose midodrin once a day with no orthostatic symptoms. She has been on this for over 7 years. No chest pain or dyspnea.   Current Outpatient Medications on File Prior to Visit  Medication Sig Dispense Refill  . Calcium Carbonate-Vitamin D (CALTRATE 600+D PO) Take 600 mg by mouth daily after breakfast.    . furosemide (LASIX) 20 MG tablet Take 1 tablet (20 mg total) by mouth daily. 30 tablet 1  . gentamicin cream (GARAMYCIN) 0.1 % Apply 1 application topically 3 (three) times daily. 30 g 1  . latanoprost (XALATAN) 0.005 % ophthalmic solution Place 1 drop into both eyes at bedtime.   10  . letrozole (FEMARA) 2.5 MG tablet Take 1 tablet (2.5 mg total) by mouth daily. (Patient taking  differently: Take 2.5 mg by mouth daily with breakfast. ) 90 tablet 3  . levothyroxine (SYNTHROID, LEVOTHROID) 50 MCG tablet Take 1 tablet (50 mcg total) by mouth daily before breakfast. 90 tablet 2  . midodrine (PROAMATINE) 2.5 MG tablet Take 2.5 mg by mouth daily after breakfast.     . triamcinolone ointment (KENALOG) 0.1 % Apply 1 application topically daily as needed. Lower extremities. 45 g 1   No current facility-administered medications on file prior to visit.     Allergies  Allergen Reactions  . Amoxicillin-Pot Clavulanate Diarrhea and Nausea And Vomiting    Has patient had a PCN reaction causing immediate rash, facial/tongue/throat swelling, SOB or lightheadedness with hypotension: Yes Has patient had a PCN reaction causing severe rash involving mucus membranes or skin necrosis: No Has patient had a PCN reaction that required hospitalization: No Has patient had a PCN reaction occurring within the last 10 years: Yes If all of the above answers are "NO", then may proceed with Cephalosporin use.   . Erythromycin Hives  . Tape Other (See Comments)    BAND AIDS-SKIN IRRITATION  . Codeine Nausea And Vomiting  . Flagyl [Metronidazole] Hives  . Macrobid [Nitrofurantoin Macrocrystal] Nausea And Vomiting  . Tolterodine Nausea And Vomiting  . Ciprofloxacin Hives and Rash    Past Medical History:  Diagnosis Date  . Cataract   . Complete heart block (HCC)    s/p PPM implant (MDT) by Dr Blanch Media.  Atrial lead could not be paced at time of the procedure.  She has  chronic AV dysociation  . Delusional disorder ([place])   . Exogenous obesity   . Hypercholesterolemia   . Invasive ductal carcinoma of breast ([place]) 03/2007   BILATERAL BREASTS  . Orthostatic hypotension    treated with midodrine by Dr Rollene [name]  . PONV (postoperative nausea and vomiting)   . Thyroid disease   . Venous insufficiency     Past Surgical History:  Procedure Laterality Date  . ABDOMINAL HYSTERECTOMY    .  BREAST LUMPECTOMY Bilateral 2009  . CATARACT EXTRACTION     EYE SURGERY X 2  . CHOLECYSTECTOMY    . COLONOSCOPY N/A 02/16/2015   Procedure: COLONOSCOPY;  Surgeon: Wilford Corner, MD;  Location: [facility] ENDOSCOPY;  Service: Endoscopy;  Laterality: N/A;  . EP IMPLANTABLE DEVICE N/A 02/23/2015   pacemaker generator change (MDT Sensia SR) by Dr Rayann Heman   . ESOPHAGOGASTRODUODENOSCOPY N/A 02/16/2015   Procedure: ESOPHAGOGASTRODUODENOSCOPY (EGD);  Surgeon: Wilford Corner, MD;  Location[facility] ENDOSCOPY;  Service: Endoscopy;  Laterality: N/A;  . PACEMAKER INSERTION     MDT implanted by Dr Blanch Media.  Atrial lead placement was unsuccessful.  She has chronic AV dysociation  . remote right radical nephrectomy  2009    Social History   Tobacco Use  Smoking Status Never Smoker  Smokeless Tobacco Never Used    Social History   Substance and Sexual Activity  Alcohol Use No  . Alcohol/week: 0.0 standard drinks    Family History  Problem Relation Age of Onset  . Heart disease Maternal Grandmother   . Heart disease Mother     Review of Systems: As noted in history of present illness.  All other systems were reviewed and are negative.  Physical Exam: BP 138/76   Pulse 74   Ht 5' 7.5" (1.715 m)   Wt 214 lb (97.1 kg)   BMI 33.02 kg/m  GENERAL:  Well appearing overweight WF in NAD HEENT:  PERRL, EOMI, sclera are clear. Oropharynx is clear. NECK:  No jugular venous distention, carotid upstroke brisk and symmetric, no bruits, no thyromegaly or adenopathy LUNGS:  Clear to auscultation bilaterally CHEST:  Unremarkable HEART:  RRR,  PMI not displaced or sustained,S1 and S2 within normal limits, no S3, no S4: no clicks, no rubs, no murmurs ABD:  Soft, nontender. BS +, no masses or bruits. No hepatomegaly, no splenomegaly EXT:  2 + pulses throughout, 1-2+ RLE  Edema, 1+ on left, no cyanosis no clubbing. Right ankle wrapped. SKIN:  Warm and dry.  No rashes NEURO:  Alert and oriented x 3. Cranial  nerves II through XII intact. PSYCH:  Cognitively intact      LABORATORY DATA: Lab Results  Component Value Date   WBC 7.0 01/15/2018   HGB 14.6 01/15/2018   HCT 45.2 01/15/2018   PLT 221 01/15/2018   GLUCOSE 94 01/15/2018   CHOL 203 (H) 12/22/2012   TRIG 140 12/22/2012   HDL 54 12/22/2012   LDLCALC 121 (H) 12/22/2012   ALT 11 01/15/2018   AST 17 01/15/2018   NA 144 01/15/2018   K 3.5 01/15/2018   CL 109 01/15/2018   CREATININE 1.16 (H) 01/15/2018   BUN 15 01/15/2018   CO2 23 01/15/2018   TSH 3.60 01/09/2018   INR 0.96 03/30/2017   HGBA1C 6.0 (H) 08/11/2015   Labs dated 04/12/16: cholesterol 178, triglycerides 101, LDL 94, HDL 64. BUN 18, creatinine 1.24. Other chemistries, TSH, CBC normal. Dated 01/24/17: HDL 62, triglycerides 83, A1c 5.9%.  Dated 08/28/17: cholesterol 185, triglycerides 81,  HDL 75, LDL 94. A1c 6.1%.   . Assessment / Plan: 1. History complete heart block status post permanent VVIR pacemaker placement in June of 2009. Revised 02/23/15. Pacer check Dec 2019 was normal. Clinically doing well. She is followed by Dr. Rayann Heman in the pacer clinic. I'll followup again in one year.  2. History of orthostatic hypotension-on midodrine once a day. Asymptomatic.  3. Hyperlipidemia on statin therapy. Controlled.  4. History of recurrent diverticular bleed. Now off ASA.  5. LE edema due to venous stasis. Chronic nonhealing wound on right ankle. Continue Rx

## 2018-05-11 ENCOUNTER — Encounter: Payer: Self-pay | Admitting: Cardiology

## 2018-05-11 ENCOUNTER — Ambulatory Visit (INDEPENDENT_AMBULATORY_CARE_PROVIDER_SITE_OTHER): Payer: Medicare Other | Admitting: Cardiology

## 2018-05-11 VITALS — BP 138/76 | HR 74 | Ht 67.5 in | Wt 214.0 lb

## 2018-05-11 DIAGNOSIS — I442 Atrioventricular block, complete: Secondary | ICD-10-CM

## 2018-05-11 DIAGNOSIS — Z950 Presence of cardiac pacemaker: Secondary | ICD-10-CM

## 2018-05-11 DIAGNOSIS — I83893 Varicose veins of bilateral lower extremities with other complications: Secondary | ICD-10-CM

## 2018-05-11 DIAGNOSIS — I951 Orthostatic hypotension: Secondary | ICD-10-CM

## 2018-05-12 DIAGNOSIS — I8312 Varicose veins of left lower extremity with inflammation: Secondary | ICD-10-CM | POA: Diagnosis not present

## 2018-05-12 DIAGNOSIS — I83018 Varicose veins of right lower extremity with ulcer other part of lower leg: Secondary | ICD-10-CM | POA: Diagnosis not present

## 2018-05-12 DIAGNOSIS — L97811 Non-pressure chronic ulcer of other part of right lower leg limited to breakdown of skin: Secondary | ICD-10-CM | POA: Diagnosis not present

## 2018-05-12 DIAGNOSIS — L97511 Non-pressure chronic ulcer of other part of right foot limited to breakdown of skin: Secondary | ICD-10-CM | POA: Diagnosis not present

## 2018-05-12 DIAGNOSIS — Z853 Personal history of malignant neoplasm of breast: Secondary | ICD-10-CM | POA: Diagnosis not present

## 2018-05-12 DIAGNOSIS — I10 Essential (primary) hypertension: Secondary | ICD-10-CM | POA: Diagnosis not present

## 2018-05-19 ENCOUNTER — Telehealth: Payer: Self-pay | Admitting: Family Medicine

## 2018-05-19 DIAGNOSIS — I83018 Varicose veins of right lower extremity with ulcer other part of lower leg: Secondary | ICD-10-CM | POA: Diagnosis not present

## 2018-05-19 DIAGNOSIS — I8312 Varicose veins of left lower extremity with inflammation: Secondary | ICD-10-CM | POA: Diagnosis not present

## 2018-05-19 DIAGNOSIS — I10 Essential (primary) hypertension: Secondary | ICD-10-CM | POA: Diagnosis not present

## 2018-05-19 DIAGNOSIS — L97511 Non-pressure chronic ulcer of other part of right foot limited to breakdown of skin: Secondary | ICD-10-CM | POA: Diagnosis not present

## 2018-05-19 DIAGNOSIS — L97811 Non-pressure chronic ulcer of other part of right lower leg limited to breakdown of skin: Secondary | ICD-10-CM | POA: Diagnosis not present

## 2018-05-19 DIAGNOSIS — Z853 Personal history of malignant neoplasm of breast: Secondary | ICD-10-CM | POA: Diagnosis not present

## 2018-05-19 NOTE — Telephone Encounter (Signed)
It is okay to give verbal authorization for wound care as requested. Thanks, BJ

## 2018-05-19 NOTE — Telephone Encounter (Unsigned)
Copied from [place] [id number]. Topic: Quick Communication - Home Health Verbal Orders >> May 19, 2018  1:12 PM Carolyn Stare wrote: [name] with [facility]  recertification   Callback Number  [phone]  Requesting   req verbal  orders for nursing for wound care   Frequency    2 x 4

## 2018-05-20 DIAGNOSIS — Z79899 Other long term (current) drug therapy: Secondary | ICD-10-CM | POA: Diagnosis not present

## 2018-05-20 DIAGNOSIS — L89893 Pressure ulcer of other site, stage 3: Secondary | ICD-10-CM | POA: Diagnosis not present

## 2018-05-20 DIAGNOSIS — I10 Essential (primary) hypertension: Secondary | ICD-10-CM | POA: Diagnosis not present

## 2018-05-20 DIAGNOSIS — L97811 Non-pressure chronic ulcer of other part of right lower leg limited to breakdown of skin: Secondary | ICD-10-CM | POA: Diagnosis not present

## 2018-05-20 DIAGNOSIS — I8312 Varicose veins of left lower extremity with inflammation: Secondary | ICD-10-CM | POA: Diagnosis not present

## 2018-05-20 DIAGNOSIS — I83018 Varicose veins of right lower extremity with ulcer other part of lower leg: Secondary | ICD-10-CM | POA: Diagnosis not present

## 2018-05-20 DIAGNOSIS — Z853 Personal history of malignant neoplasm of breast: Secondary | ICD-10-CM | POA: Diagnosis not present

## 2018-05-20 NOTE — Telephone Encounter (Signed)
Spoke with [name], gave verbal orders for wound care as requested per [name].

## 2018-05-22 DIAGNOSIS — I83018 Varicose veins of right lower extremity with ulcer other part of lower leg: Secondary | ICD-10-CM | POA: Diagnosis not present

## 2018-05-22 DIAGNOSIS — L97811 Non-pressure chronic ulcer of other part of right lower leg limited to breakdown of skin: Secondary | ICD-10-CM | POA: Diagnosis not present

## 2018-05-22 DIAGNOSIS — Z853 Personal history of malignant neoplasm of breast: Secondary | ICD-10-CM | POA: Diagnosis not present

## 2018-05-22 DIAGNOSIS — L89893 Pressure ulcer of other site, stage 3: Secondary | ICD-10-CM | POA: Diagnosis not present

## 2018-05-22 DIAGNOSIS — I10 Essential (primary) hypertension: Secondary | ICD-10-CM | POA: Diagnosis not present

## 2018-05-22 DIAGNOSIS — I8312 Varicose veins of left lower extremity with inflammation: Secondary | ICD-10-CM | POA: Diagnosis not present

## 2018-05-26 DIAGNOSIS — Z853 Personal history of malignant neoplasm of breast: Secondary | ICD-10-CM | POA: Diagnosis not present

## 2018-05-26 DIAGNOSIS — I83018 Varicose veins of right lower extremity with ulcer other part of lower leg: Secondary | ICD-10-CM | POA: Diagnosis not present

## 2018-05-26 DIAGNOSIS — I8312 Varicose veins of left lower extremity with inflammation: Secondary | ICD-10-CM | POA: Diagnosis not present

## 2018-05-26 DIAGNOSIS — L97811 Non-pressure chronic ulcer of other part of right lower leg limited to breakdown of skin: Secondary | ICD-10-CM | POA: Diagnosis not present

## 2018-05-26 DIAGNOSIS — I10 Essential (primary) hypertension: Secondary | ICD-10-CM | POA: Diagnosis not present

## 2018-05-26 DIAGNOSIS — L89893 Pressure ulcer of other site, stage 3: Secondary | ICD-10-CM | POA: Diagnosis not present

## 2018-05-29 DIAGNOSIS — I83018 Varicose veins of right lower extremity with ulcer other part of lower leg: Secondary | ICD-10-CM | POA: Diagnosis not present

## 2018-05-29 DIAGNOSIS — L89893 Pressure ulcer of other site, stage 3: Secondary | ICD-10-CM | POA: Diagnosis not present

## 2018-05-29 DIAGNOSIS — I10 Essential (primary) hypertension: Secondary | ICD-10-CM | POA: Diagnosis not present

## 2018-05-29 DIAGNOSIS — L97811 Non-pressure chronic ulcer of other part of right lower leg limited to breakdown of skin: Secondary | ICD-10-CM | POA: Diagnosis not present

## 2018-05-29 DIAGNOSIS — Z853 Personal history of malignant neoplasm of breast: Secondary | ICD-10-CM | POA: Diagnosis not present

## 2018-05-29 DIAGNOSIS — I8312 Varicose veins of left lower extremity with inflammation: Secondary | ICD-10-CM | POA: Diagnosis not present

## 2018-06-01 ENCOUNTER — Ambulatory Visit (INDEPENDENT_AMBULATORY_CARE_PROVIDER_SITE_OTHER): Payer: Medicare Other | Admitting: Podiatry

## 2018-06-01 DIAGNOSIS — L97512 Non-pressure chronic ulcer of other part of right foot with fat layer exposed: Secondary | ICD-10-CM

## 2018-06-01 DIAGNOSIS — L97519 Non-pressure chronic ulcer of other part of right foot with unspecified severity: Secondary | ICD-10-CM

## 2018-06-01 DIAGNOSIS — I83015 Varicose veins of right lower extremity with ulcer other part of foot: Secondary | ICD-10-CM | POA: Diagnosis not present

## 2018-06-03 ENCOUNTER — Telehealth: Payer: Self-pay | Admitting: Podiatry

## 2018-06-03 DIAGNOSIS — I10 Essential (primary) hypertension: Secondary | ICD-10-CM | POA: Diagnosis not present

## 2018-06-03 DIAGNOSIS — I8312 Varicose veins of left lower extremity with inflammation: Secondary | ICD-10-CM | POA: Diagnosis not present

## 2018-06-03 DIAGNOSIS — Z853 Personal history of malignant neoplasm of breast: Secondary | ICD-10-CM | POA: Diagnosis not present

## 2018-06-03 DIAGNOSIS — L89893 Pressure ulcer of other site, stage 3: Secondary | ICD-10-CM | POA: Diagnosis not present

## 2018-06-03 DIAGNOSIS — I83018 Varicose veins of right lower extremity with ulcer other part of lower leg: Secondary | ICD-10-CM | POA: Diagnosis not present

## 2018-06-03 DIAGNOSIS — L97811 Non-pressure chronic ulcer of other part of right lower leg limited to breakdown of skin: Secondary | ICD-10-CM | POA: Diagnosis not present

## 2018-06-03 NOTE — Telephone Encounter (Signed)
Continue unna boot wraps x 3 weeks. Continue the same wound care to the toe ulcer. Thanks, Dr. [name]

## 2018-06-03 NOTE — Telephone Encounter (Signed)
Olivia Mackie, RN with [facility] called regarding pt's wound care as she is out there with the pt. After looking after the after visit summary from Monday's visit, it looks like the right heal wound is healed. I'm calling to verify of what is going on and what she is needing to be treated for and to see if we can get her orders for once a week now.

## 2018-06-03 NOTE — Telephone Encounter (Signed)
I spoke with Olivia Mackie, RN - [facility] and informed of Dr. [name] [facility] orders. Olivia Mackie, RN stated [name] had d/c the unna boots, and asked if Dr. [name] wanted the left 4th toe dressed with gentamicin cream. I told Olivia Mackie, RN to continue the Gentamicin Cream to the left 4th toe.

## 2018-06-04 NOTE — Progress Notes (Signed)
   Subjective:  85 year old female presents today for follow up evaluation of ulcerations noted to the right lower extremity. She states her wounds are improving. She has been having the bandages changed as directed and staying off of it. She denies modifying factors. Patient is here for further evaluation and treatment.   Past Medical History:  Diagnosis Date  . Cataract   . Complete heart block (HCC)    s/p PPM implant (MDT) by Dr Blanch Media.  Atrial lead could not be paced at time of the procedure.  She has chronic AV dysociation  . Delusional disorder ([place])   . Exogenous obesity   . Hypercholesterolemia   . Invasive ductal carcinoma of breast ([place]) 03/2007   BILATERAL BREASTS  . Orthostatic hypotension    treated with midodrine by Dr Rollene [name]  . PONV (postoperative nausea and vomiting)   . Thyroid disease   . Venous insufficiency      Objective/Physical Exam General: The patient is alert and oriented x3 in no acute distress.  Dermatology:  Wound noted to the right lateral ankle has healed. Complete re-epithelialization has occurred. No drainage noted.    Wound #1 noted to the right fourth toe measuring 0.3 x 0.3 x 0.2 cm.  To the noted ulceration(s), there is no eschar. There is a moderate amount of slough, fibrin, and necrotic tissue noted. Granulation tissue and wound base is red. There is a minimal amount of serosanguineous drainage noted. There is no exposed bone muscle-tendon ligament or joint. There is no malodor. Periwound integrity is intact. Skin is warm, dry and supple bilateral lower extremities.  Wound noted to the right plantar forefoot has healed. Complete re-epithelialization has occurred. No drainage noted.   Vascular: Palpable pedal pulses bilaterally. Capillary refill within normal limits. Varicosities noted bilateral lower extremities.   Neurological: Epicritic and protective threshold diminished bilaterally.   Musculoskeletal Exam: Range of motion within  normal limits to all pedal and ankle joints bilateral. Muscle strength 5/5 in all groups bilateral.    Assessment: #1 ulceration to the right lateral ankle secondary to venous insufficiency - healed  #2 ulceration to the right fourth toe secondary to venous insufficiency  #3 Diabetes Mellitus w/ peripheral neuropathy #4 Varicosities noted to bilateral lower extremities     Plan of Care:  #1 Patient was evaluated. #2 medically necessary excisional debridement including subcutaneous tissue of the right fourth toe wound was performed using a tissue nipper and a chisel blade. Excisional debridement of all the necrotic nonviable tissue down to healthy bleeding viable tissue was performed with post-debridement measurements same as pre-. #3 the wound was cleansed with normal saline and dry sterile dressing applied. #4 Continue home health care dressing changes Monday, Wednesday and Friday.   #5 Continue home health care unna boot applications as per PCP, [name].  #6 Return to clinic in 4 weeks.   Edrick Kins, DPM [facility]  Dr. Edrick Kins, [address]                                        [place], [place] [address]                Office [phone]  Fax [phone]

## 2018-06-08 DIAGNOSIS — I8312 Varicose veins of left lower extremity with inflammation: Secondary | ICD-10-CM | POA: Diagnosis not present

## 2018-06-08 DIAGNOSIS — L97811 Non-pressure chronic ulcer of other part of right lower leg limited to breakdown of skin: Secondary | ICD-10-CM | POA: Diagnosis not present

## 2018-06-08 DIAGNOSIS — L89893 Pressure ulcer of other site, stage 3: Secondary | ICD-10-CM | POA: Diagnosis not present

## 2018-06-08 DIAGNOSIS — I83018 Varicose veins of right lower extremity with ulcer other part of lower leg: Secondary | ICD-10-CM | POA: Diagnosis not present

## 2018-06-08 DIAGNOSIS — I10 Essential (primary) hypertension: Secondary | ICD-10-CM | POA: Diagnosis not present

## 2018-06-08 DIAGNOSIS — Z853 Personal history of malignant neoplasm of breast: Secondary | ICD-10-CM | POA: Diagnosis not present

## 2018-06-16 DIAGNOSIS — L97811 Non-pressure chronic ulcer of other part of right lower leg limited to breakdown of skin: Secondary | ICD-10-CM | POA: Diagnosis not present

## 2018-06-16 DIAGNOSIS — I10 Essential (primary) hypertension: Secondary | ICD-10-CM | POA: Diagnosis not present

## 2018-06-16 DIAGNOSIS — L89893 Pressure ulcer of other site, stage 3: Secondary | ICD-10-CM | POA: Diagnosis not present

## 2018-06-16 DIAGNOSIS — I8312 Varicose veins of left lower extremity with inflammation: Secondary | ICD-10-CM | POA: Diagnosis not present

## 2018-06-16 DIAGNOSIS — Z853 Personal history of malignant neoplasm of breast: Secondary | ICD-10-CM | POA: Diagnosis not present

## 2018-06-16 DIAGNOSIS — I83018 Varicose veins of right lower extremity with ulcer other part of lower leg: Secondary | ICD-10-CM | POA: Diagnosis not present

## 2018-06-19 DIAGNOSIS — I8312 Varicose veins of left lower extremity with inflammation: Secondary | ICD-10-CM | POA: Diagnosis not present

## 2018-06-19 DIAGNOSIS — I83018 Varicose veins of right lower extremity with ulcer other part of lower leg: Secondary | ICD-10-CM | POA: Diagnosis not present

## 2018-06-19 DIAGNOSIS — Z79899 Other long term (current) drug therapy: Secondary | ICD-10-CM | POA: Diagnosis not present

## 2018-06-19 DIAGNOSIS — Z853 Personal history of malignant neoplasm of breast: Secondary | ICD-10-CM | POA: Diagnosis not present

## 2018-06-19 DIAGNOSIS — L97811 Non-pressure chronic ulcer of other part of right lower leg limited to breakdown of skin: Secondary | ICD-10-CM | POA: Diagnosis not present

## 2018-06-19 DIAGNOSIS — I10 Essential (primary) hypertension: Secondary | ICD-10-CM | POA: Diagnosis not present

## 2018-06-19 DIAGNOSIS — L89893 Pressure ulcer of other site, stage 3: Secondary | ICD-10-CM | POA: Diagnosis not present

## 2018-06-24 DIAGNOSIS — L89893 Pressure ulcer of other site, stage 3: Secondary | ICD-10-CM | POA: Diagnosis not present

## 2018-06-24 DIAGNOSIS — Z853 Personal history of malignant neoplasm of breast: Secondary | ICD-10-CM | POA: Diagnosis not present

## 2018-06-24 DIAGNOSIS — I10 Essential (primary) hypertension: Secondary | ICD-10-CM | POA: Diagnosis not present

## 2018-06-24 DIAGNOSIS — L97811 Non-pressure chronic ulcer of other part of right lower leg limited to breakdown of skin: Secondary | ICD-10-CM | POA: Diagnosis not present

## 2018-06-24 DIAGNOSIS — I8312 Varicose veins of left lower extremity with inflammation: Secondary | ICD-10-CM | POA: Diagnosis not present

## 2018-06-24 DIAGNOSIS — I83018 Varicose veins of right lower extremity with ulcer other part of lower leg: Secondary | ICD-10-CM | POA: Diagnosis not present

## 2018-06-26 ENCOUNTER — Telehealth: Payer: Self-pay | Admitting: Family Medicine

## 2018-06-26 DIAGNOSIS — L97811 Non-pressure chronic ulcer of other part of right lower leg limited to breakdown of skin: Secondary | ICD-10-CM | POA: Diagnosis not present

## 2018-06-26 DIAGNOSIS — I83018 Varicose veins of right lower extremity with ulcer other part of lower leg: Secondary | ICD-10-CM | POA: Diagnosis not present

## 2018-06-26 DIAGNOSIS — I10 Essential (primary) hypertension: Secondary | ICD-10-CM | POA: Diagnosis not present

## 2018-06-26 DIAGNOSIS — L89893 Pressure ulcer of other site, stage 3: Secondary | ICD-10-CM | POA: Diagnosis not present

## 2018-06-26 DIAGNOSIS — Z853 Personal history of malignant neoplasm of breast: Secondary | ICD-10-CM | POA: Diagnosis not present

## 2018-06-26 DIAGNOSIS — I8312 Varicose veins of left lower extremity with inflammation: Secondary | ICD-10-CM | POA: Diagnosis not present

## 2018-06-26 NOTE — Telephone Encounter (Signed)
Message sent to Dr. Jordan for review and approval. 

## 2018-06-26 NOTE — Telephone Encounter (Signed)
Copied from [place]. Topic: Quick Communication - Rx Refill/Question >> Jun 26, 2018  9:00 AM Blase Mess A wrote: Medication: midodrine (PROAMATINE) 2.5 MG tablet [[id number]]   Has the patient contacted their pharmacy? Yes  (Agent: If no, request that the patient contact the pharmacy for the refill.) (Agent: If yes, when and what did the pharmacy advise?)  Preferred Pharmacy (with phone number or street name): [facility] [phone]  Agent: Please be advised that RX refills may take up to 3 business days. We ask that you follow-up with your pharmacy.

## 2018-06-26 NOTE — Telephone Encounter (Signed)
Spoke with pharmacy and gave directions per [name].

## 2018-06-26 NOTE — Telephone Encounter (Signed)
Can you please call her pharmacy and asked them to send request to her cardiologist. Midodrin is being prescribed by cardiologist, I do not feel comfortable refilling it. Thanks, BJ

## 2018-06-29 ENCOUNTER — Ambulatory Visit: Payer: Medicare Other | Admitting: Podiatry

## 2018-06-29 DIAGNOSIS — H43811 Vitreous degeneration, right eye: Secondary | ICD-10-CM | POA: Diagnosis not present

## 2018-06-29 DIAGNOSIS — H401132 Primary open-angle glaucoma, bilateral, moderate stage: Secondary | ICD-10-CM | POA: Diagnosis not present

## 2018-06-29 DIAGNOSIS — Z961 Presence of intraocular lens: Secondary | ICD-10-CM | POA: Diagnosis not present

## 2018-06-30 DIAGNOSIS — L97811 Non-pressure chronic ulcer of other part of right lower leg limited to breakdown of skin: Secondary | ICD-10-CM | POA: Diagnosis not present

## 2018-06-30 DIAGNOSIS — L89893 Pressure ulcer of other site, stage 3: Secondary | ICD-10-CM | POA: Diagnosis not present

## 2018-06-30 DIAGNOSIS — I83018 Varicose veins of right lower extremity with ulcer other part of lower leg: Secondary | ICD-10-CM | POA: Diagnosis not present

## 2018-06-30 DIAGNOSIS — Z853 Personal history of malignant neoplasm of breast: Secondary | ICD-10-CM | POA: Diagnosis not present

## 2018-06-30 DIAGNOSIS — I8312 Varicose veins of left lower extremity with inflammation: Secondary | ICD-10-CM | POA: Diagnosis not present

## 2018-06-30 DIAGNOSIS — I10 Essential (primary) hypertension: Secondary | ICD-10-CM | POA: Diagnosis not present

## 2018-07-06 ENCOUNTER — Ambulatory Visit (INDEPENDENT_AMBULATORY_CARE_PROVIDER_SITE_OTHER): Payer: Medicare Other | Admitting: Podiatry

## 2018-07-06 ENCOUNTER — Encounter: Payer: Self-pay | Admitting: Podiatry

## 2018-07-06 DIAGNOSIS — L97519 Non-pressure chronic ulcer of other part of right foot with unspecified severity: Secondary | ICD-10-CM

## 2018-07-06 DIAGNOSIS — L989 Disorder of the skin and subcutaneous tissue, unspecified: Secondary | ICD-10-CM | POA: Diagnosis not present

## 2018-07-06 DIAGNOSIS — I8312 Varicose veins of left lower extremity with inflammation: Secondary | ICD-10-CM | POA: Diagnosis not present

## 2018-07-06 DIAGNOSIS — L89893 Pressure ulcer of other site, stage 3: Secondary | ICD-10-CM | POA: Diagnosis not present

## 2018-07-06 DIAGNOSIS — L97811 Non-pressure chronic ulcer of other part of right lower leg limited to breakdown of skin: Secondary | ICD-10-CM | POA: Diagnosis not present

## 2018-07-06 DIAGNOSIS — B351 Tinea unguium: Secondary | ICD-10-CM

## 2018-07-06 DIAGNOSIS — I10 Essential (primary) hypertension: Secondary | ICD-10-CM | POA: Diagnosis not present

## 2018-07-06 DIAGNOSIS — I83015 Varicose veins of right lower extremity with ulcer other part of foot: Secondary | ICD-10-CM

## 2018-07-06 DIAGNOSIS — M79676 Pain in unspecified toe(s): Secondary | ICD-10-CM | POA: Diagnosis not present

## 2018-07-06 DIAGNOSIS — I83018 Varicose veins of right lower extremity with ulcer other part of lower leg: Secondary | ICD-10-CM | POA: Diagnosis not present

## 2018-07-06 DIAGNOSIS — Z853 Personal history of malignant neoplasm of breast: Secondary | ICD-10-CM | POA: Diagnosis not present

## 2018-07-09 NOTE — Progress Notes (Signed)
   Subjective:  85 year old female presents today for follow up evaluation of ulcerations noted to the right lower extremity. She states her wounds are improving and have almost completely resolved. She has been having the bandages changed as directed and staying off of it. She denies modifying factors.  She also presents today with a complaint of painful callus lesions noted to the bilateral feet that have been present for several months. Walking and bearing weight increases the pain. She has not done anything for treatment at home.   Patient also complains of elongated, thickened nails that cause pain while ambulating in shoes. She is unable to trim her own nails. Patient is here for further evaluation and treatment.   Past Medical History:  Diagnosis Date  . Cataract   . Complete heart block (HCC)    s/p PPM implant (MDT) by Dr Blanch Media.  Atrial lead could not be paced at time of the procedure.  She has chronic AV dysociation  . Delusional disorder ([place])   . Exogenous obesity   . Hypercholesterolemia   . Invasive ductal carcinoma of breast ([place]) 03/2007   BILATERAL BREASTS  . Orthostatic hypotension    treated with midodrine by Dr Rollene [name]  . PONV (postoperative nausea and vomiting)   . Thyroid disease   . Venous insufficiency      Objective/Physical Exam General: The patient is alert and oriented x3 in no acute distress.  Dermatology:  Wound noted to the right fourth toe has healed. Complete re-epithelialization has occurred. No drainage noted.   Skin is warm, dry and supple bilateral lower extremities. Hyperkeratotic lesions present on the bilateral feet x 4. Pain on palpation with a central nucleated core noted. Nails are tender, long, thickened and dystrophic with subungual debris, consistent with onychomycosis, 1-5 bilateral. No signs of infection noted.  Vascular: Palpable pedal pulses bilaterally. Capillary refill within normal limits. Varicosities noted bilateral lower  extremities.   Neurological: Epicritic and protective threshold diminished bilaterally.   Musculoskeletal Exam: Range of motion within normal limits to all pedal and ankle joints bilateral. Muscle strength 5/5 in all groups bilateral.    Assessment: #1 ulceration to the right fourth toe secondary to venous insufficiency - healed  #2 Pre-ulcerative callus lesions noted to the feet bilaterally x 4 #3 Onychodystrophic nails 1-5 bilateral with hyperkeratosis of nails.  #4 Onychomycosis of nail due to dermatophyte bilateral #5 Diabetes Mellitus w/ peripheral neuropathy #6 Varicosities noted to bilateral lower extremities     Plan of Care:  #1 Patient was evaluated. #2 Mechanical debridement of nails 1-5 bilaterally performed using a nail nipper. Filed with dremel without incident.  #3 Excisional debridement of keratotic lesions using a chisel blade was performed without incident. Light dressing applied.  #4 Recommended good shoe gear.  #5 Return to clinic in 3 months for routine care.    Edrick Kins, DPM [facility]  Dr. Edrick Kins, [address]                                        [place], [place] [address]                Office [phone]  Fax [phone]

## 2018-07-15 DIAGNOSIS — L89893 Pressure ulcer of other site, stage 3: Secondary | ICD-10-CM | POA: Diagnosis not present

## 2018-07-15 DIAGNOSIS — I8312 Varicose veins of left lower extremity with inflammation: Secondary | ICD-10-CM | POA: Diagnosis not present

## 2018-07-15 DIAGNOSIS — I10 Essential (primary) hypertension: Secondary | ICD-10-CM | POA: Diagnosis not present

## 2018-07-15 DIAGNOSIS — L97811 Non-pressure chronic ulcer of other part of right lower leg limited to breakdown of skin: Secondary | ICD-10-CM | POA: Diagnosis not present

## 2018-07-15 DIAGNOSIS — I83018 Varicose veins of right lower extremity with ulcer other part of lower leg: Secondary | ICD-10-CM | POA: Diagnosis not present

## 2018-07-15 DIAGNOSIS — Z853 Personal history of malignant neoplasm of breast: Secondary | ICD-10-CM | POA: Diagnosis not present

## 2018-07-27 ENCOUNTER — Ambulatory Visit (INDEPENDENT_AMBULATORY_CARE_PROVIDER_SITE_OTHER): Payer: Medicare Other | Admitting: *Deleted

## 2018-07-27 ENCOUNTER — Other Ambulatory Visit: Payer: Self-pay

## 2018-07-27 DIAGNOSIS — I442 Atrioventricular block, complete: Secondary | ICD-10-CM | POA: Diagnosis not present

## 2018-07-27 LAB — CUP PACEART REMOTE DEVICE CHECK
Brady Statistic RV Percent Paced: 100 %
Date Time Interrogation Session: 20200330122618
Implantable Pulse Generator Implant Date: 20161027
Lead Channel Impedance Value: 0 Ohm
Lead Channel Impedance Value: 382 Ohm
Lead Channel Pacing Threshold Amplitude: 1.125 V
Lead Channel Setting Pacing Amplitude: 2.25 V
Lead Channel Setting Pacing Pulse Width: 0.4 ms
MDC IDC LEAD IMPLANT DT: 20141208
MDC IDC LEAD LOCATION: 753860
MDC IDC MSMT BATTERY IMPEDANCE: 1049 Ohm
MDC IDC MSMT BATTERY REMAINING LONGEVITY: 57 mo
MDC IDC MSMT BATTERY VOLTAGE: 2.77 V
MDC IDC MSMT LEADCHNL RV PACING THRESHOLD PULSEWIDTH: 0.4 ms
MDC IDC SET LEADCHNL RV SENSING SENSITIVITY: 2 mV

## 2018-08-05 NOTE — Progress Notes (Signed)
Remote pacemaker transmission.   

## 2018-08-15 ENCOUNTER — Telehealth: Payer: Self-pay

## 2018-08-15 NOTE — Telephone Encounter (Signed)
Spoke with pt regarding appt on 08/17/18. Pt stated she does not have access to MyChart and is unable to check vitals. Pt was advise to to keep televisit and all concerns were address.

## 2018-08-17 ENCOUNTER — Other Ambulatory Visit: Payer: Self-pay

## 2018-08-17 ENCOUNTER — Telehealth (INDEPENDENT_AMBULATORY_CARE_PROVIDER_SITE_OTHER): Payer: Medicare Other | Admitting: Internal Medicine

## 2018-08-17 DIAGNOSIS — Z950 Presence of cardiac pacemaker: Secondary | ICD-10-CM

## 2018-08-17 DIAGNOSIS — I442 Atrioventricular block, complete: Secondary | ICD-10-CM | POA: Diagnosis not present

## 2018-08-17 DIAGNOSIS — R600 Localized edema: Secondary | ICD-10-CM

## 2018-08-17 NOTE — Progress Notes (Signed)
Electrophysiology TeleHealth Note   Due to national recommendations of social distancing due to [place] 19, an audio  telehealth visit is felt to be most appropriate for this patient at this time.  Verbal consent was obtained from the patient today.  She does not have smart phone and does not have internet access.  She is therefore not a candidate for video visit.   Date:  08/17/2018   ID:  [name], DOB February 18, 1928, MRN [id number]  Location: patient's home  Provider location: [address] [place]  Evaluation Performed: Follow-up visit  PCP:  Martinique, [name], MD  Cardiologist:  Peter Martinique, MD Electrophysiologist:  Dr Rayann Heman  Chief Complaint:  edema  History of Present Illness:    [name] is a 85 y.o. female who presents via audio/video conferencing for a telehealth visit today.  Since last being seen in our clinic, the patient reports doing reasonably well.  He edema and SOB are stable.  Today, she denies symptoms of palpitations, chest pain,  dizziness, presyncope, or syncope.  The patient is otherwise without complaint today.  The patient denies symptoms of fevers, chills, cough, or new SOB worrisome for COVID 19.  Past Medical History:  Diagnosis Date  . Cataract   . Complete heart block (HCC)    s/p PPM implant (MDT) by Dr Blanch Media.  Atrial lead could not be paced at time of the procedure.  She has chronic AV dysociation  . Delusional disorder ([place])   . Exogenous obesity   . Hypercholesterolemia   . Invasive ductal carcinoma of breast ([place]) 03/2007   BILATERAL BREASTS  . Orthostatic hypotension    treated with midodrine by Dr Rollene [name]  . PONV (postoperative nausea and vomiting)   . Thyroid disease   . Venous insufficiency     Past Surgical History:  Procedure Laterality Date  . ABDOMINAL HYSTERECTOMY    . BREAST LUMPECTOMY Bilateral 2009  . CATARACT EXTRACTION     EYE SURGERY X 2  . CHOLECYSTECTOMY    . COLONOSCOPY N/A 02/16/2015    Procedure: COLONOSCOPY;  Surgeon: Wilford Corner, MD;  Location: [facility] ENDOSCOPY;  Service: Endoscopy;  Laterality: N/A;  . EP IMPLANTABLE DEVICE N/A 02/23/2015   pacemaker generator change (MDT Sensia SR) by Dr Rayann Heman   . ESOPHAGOGASTRODUODENOSCOPY N/A 02/16/2015   Procedure: ESOPHAGOGASTRODUODENOSCOPY (EGD);  Surgeon: Wilford Corner, MD;  Location: [facility] ENDOSCOPY;  Service: Endoscopy;  Laterality: N/A;  . PACEMAKER INSERTION     MDT implanted by Dr Blanch Media.  Atrial lead placement was unsuccessful.  She has chronic AV dysociation  . remote right radical nephrectomy  2009    Current Outpatient Medications  Medication Sig Dispense Refill  . Calcium Carbonate-Vitamin D (CALTRATE 600+D PO) Take 600 mg by mouth daily after breakfast.    . furosemide (LASIX) 20 MG tablet Take 1 tablet (20 mg total) by mouth daily. 30 tablet 1  . gentamicin cream (GARAMYCIN) 0.1 % Apply 1 application topically 3 (three) times daily. 30 g 1  . latanoprost (XALATAN) 0.005 % ophthalmic solution Place 1 drop into both eyes at bedtime.   10  . letrozole (FEMARA) 2.5 MG tablet Take 2.5 mg by mouth daily.    Marland Kitchen levothyroxine (SYNTHROID, LEVOTHROID) 50 MCG tablet Take 1 tablet (50 mcg total) by mouth daily before breakfast. 90 tablet 2  . midodrine (PROAMATINE) 2.5 MG tablet Take 2.5 mg by mouth daily after breakfast.     . triamcinolone ointment (KENALOG) 0.1 %  Apply 1 application topically daily as needed. Lower extremities. 45 g 1   No current facility-administered medications for this visit.     Allergies:   Amoxicillin-pot clavulanate; Erythromycin; Tape; Codeine; Flagyl [metronidazole]; Macrobid [nitrofurantoin macrocrystal]; Tolterodine; and Ciprofloxacin   Social History:  The patient  reports that she has never smoked. She has never used smokeless tobacco. She reports that she does not drink alcohol or use drugs.   Family History:  The patient's  family history includes Heart disease in her maternal  grandmother and mother.   ROS:  Please see the history of present illness.   All other systems are personally reviewed and negative.    Exam:    Vital Signs:  There were no vitals taken for this visit.  Well sounding   Labs/Other Tests and Data Reviewed:    Recent Labs: 01/09/2018: TSH 3.60 01/15/2018: ALT 11; B Natriuretic Peptide 313.7; BUN 15; Creatinine, Ser 1.16; Hemoglobin 14.6; Platelets 221; Potassium 3.5; Sodium 144   Wt Readings from Last 3 Encounters:  05/11/18 214 lb (97.1 kg)  03/20/18 211 lb (95.7 kg)  01/19/18 213 lb (96.6 kg)     Other studies personally reviewed: Additional studies/ records that were reviewed today include: Dr Morrison Old ntoes  Review of the above records today demonstrates: as above    Last device remote is reviewed from [facility] PDF dated 06/3018 which reveals normal device function, no arrhythmias    ASSESSMENT & PLAN:    1.  Complete heart block Normal remote function confirmed Dr Thersa Salt was unable to place an atrial lead.  She has done well with VVI pacing for years  2. Edema/ SOB Stable No change required today  3. COVID 19 screen The patient denies symptoms of COVID 19 at this time.  The importance of social distancing was discussed today.  Follow-up:  34months with EP PA Next remote: 09/2018  Current medicines are reviewed at length with the patient today.   The patient does not have concerns regarding her medicines.  The following changes were made today:  none  Labs/ tests ordered today include:  No orders of the defined types were placed in this encounter.   Patient Risk:  after full review of this patients clinical status, I feel that they are at moderate risk at this time.  Today, I have spent 15 minutes with the patient with telehealth technology discussing edema .    SignedThompson Grayer, MD  08/17/2018 2:11 PM     [place] [address] [phone] (office)  [phone] (fax)

## 2018-09-02 ENCOUNTER — Telehealth: Payer: Self-pay | Admitting: Family Medicine
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
## 2018-09-03 NOTE — Telephone Encounter (Signed)
Spoke with Estill Bamberg and gave verbal orders as requested.

## 2018-09-04 ENCOUNTER — Telehealth: Payer: Self-pay | Admitting: Family Medicine

## 2018-09-04 DIAGNOSIS — I10 Essential (primary) hypertension: Secondary | ICD-10-CM | POA: Diagnosis not present

## 2018-09-04 DIAGNOSIS — R51 Headache: Secondary | ICD-10-CM | POA: Diagnosis not present

## 2018-09-04 DIAGNOSIS — N390 Urinary tract infection, site not specified: Secondary | ICD-10-CM | POA: Diagnosis not present

## 2018-09-04 DIAGNOSIS — F29 Unspecified psychosis not due to a substance or known physiological condition: Secondary | ICD-10-CM | POA: Diagnosis not present

## 2018-09-04 DIAGNOSIS — S91104A Unspecified open wound of right lesser toe(s) without damage to nail, initial encounter: Secondary | ICD-10-CM | POA: Diagnosis not present

## 2018-09-04 DIAGNOSIS — Z743 Need for continuous supervision: Secondary | ICD-10-CM | POA: Diagnosis not present

## 2018-09-04 DIAGNOSIS — R4182 Altered mental status, unspecified: Secondary | ICD-10-CM | POA: Diagnosis not present

## 2018-09-04 DIAGNOSIS — H539 Unspecified visual disturbance: Secondary | ICD-10-CM | POA: Diagnosis not present

## 2018-09-04 NOTE — Telephone Encounter (Signed)
Copied from [place] [phone]. Topic: Quick Communication - Home Health Verbal Orders >> Sep 04, 2018  9:59 AM Leward Quan A wrote: Caller/Agency: [name] / [facility]  Callback Number: [phone] ok to LM Requesting OT/PT/Skilled Nursing/Social Work/Speech Therapy: Skilled nursing Frequency: 2 xs a week  Requesting Social Work evaluation, Wound care to right fourth toe calcium alganate orders. Can not do anything without orders and need to speak to someone directly also

## 2018-09-04 NOTE — Telephone Encounter (Signed)
Message sent to Dr. Jordan for review and approval. 

## 2018-09-04 NOTE — Telephone Encounter (Signed)
Verbal authorization for requested orders can be given. Calcium alginate dressing to cover wound and it can be changed every 2 to 3 days. Thanks, BJ

## 2018-09-04 NOTE — Telephone Encounter (Signed)
Spoke with [name] and gave verbal orders as requested per [name].

## 2018-09-08 ENCOUNTER — Telehealth: Payer: Self-pay | Admitting: Family Medicine

## 2018-09-08 DIAGNOSIS — I10 Essential (primary) hypertension: Secondary | ICD-10-CM | POA: Diagnosis not present

## 2018-09-08 DIAGNOSIS — R4182 Altered mental status, unspecified: Secondary | ICD-10-CM | POA: Diagnosis not present

## 2018-09-08 DIAGNOSIS — R51 Headache: Secondary | ICD-10-CM | POA: Diagnosis not present

## 2018-09-08 DIAGNOSIS — F29 Unspecified psychosis not due to a substance or known physiological condition: Secondary | ICD-10-CM | POA: Diagnosis not present

## 2018-09-08 DIAGNOSIS — S91104A Unspecified open wound of right lesser toe(s) without damage to nail, initial encounter: Secondary | ICD-10-CM | POA: Diagnosis not present

## 2018-09-08 DIAGNOSIS — H539 Unspecified visual disturbance: Secondary | ICD-10-CM | POA: Diagnosis not present
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
## 2018-09-08 NOTE — Telephone Encounter (Unsigned)
Copied from [place] [phone]. Topic: General - Other >> Sep 08, 2018  3:06 PM Carolyn Stare wrote:  [name] with Social worker [facility] home [facility] would like a call back to discuss pt, there is some issues with this pt will be calling Social Services cause there is things going on

## 2018-09-09 NOTE — Telephone Encounter (Signed)
This is not a new problem. She has Hx of anxiety,derpession,and delusional behavior. She has been prescribed medication in the past and used to follow with psychiatrist (as I remember) she stopped following because she thought she did not needed medications.  She really needs to establish with psychiatrist again.  Thanks, BJ

## 2018-09-10 NOTE — Telephone Encounter (Signed)
Spoke with [name] from [facility] and gave recommendations per [name]. [name] stated that she would see what they could do and let us know what the outcome. Nothing further needed at this time.

## 2018-09-11 DIAGNOSIS — I10 Essential (primary) hypertension: Secondary | ICD-10-CM | POA: Diagnosis not present

## 2018-09-11 DIAGNOSIS — S91104A Unspecified open wound of right lesser toe(s) without damage to nail, initial encounter: Secondary | ICD-10-CM | POA: Diagnosis not present

## 2018-09-11 DIAGNOSIS — F29 Unspecified psychosis not due to a substance or known physiological condition: Secondary | ICD-10-CM | POA: Diagnosis not present

## 2018-09-11 DIAGNOSIS — H539 Unspecified visual disturbance: Secondary | ICD-10-CM | POA: Diagnosis not present

## 2018-09-11 DIAGNOSIS — R51 Headache: Secondary | ICD-10-CM | POA: Diagnosis not present

## 2018-09-11 DIAGNOSIS — R4182 Altered mental status, unspecified: Secondary | ICD-10-CM | POA: Diagnosis not present

## 2018-09-14 DIAGNOSIS — H539 Unspecified visual disturbance: Secondary | ICD-10-CM | POA: Diagnosis not present

## 2018-09-14 DIAGNOSIS — F29 Unspecified psychosis not due to a substance or known physiological condition: Secondary | ICD-10-CM | POA: Diagnosis not present

## 2018-09-14 DIAGNOSIS — I10 Essential (primary) hypertension: Secondary | ICD-10-CM | POA: Diagnosis not present

## 2018-09-14 DIAGNOSIS — R51 Headache: Secondary | ICD-10-CM | POA: Diagnosis not present

## 2018-09-14 DIAGNOSIS — R4182 Altered mental status, unspecified: Secondary | ICD-10-CM | POA: Diagnosis not present

## 2018-09-14 DIAGNOSIS — S91104A Unspecified open wound of right lesser toe(s) without damage to nail, initial encounter: Secondary | ICD-10-CM | POA: Diagnosis not present

## 2018-09-16 DIAGNOSIS — R4182 Altered mental status, unspecified: Secondary | ICD-10-CM | POA: Diagnosis not present

## 2018-09-16 DIAGNOSIS — I10 Essential (primary) hypertension: Secondary | ICD-10-CM | POA: Diagnosis not present

## 2018-09-16 DIAGNOSIS — H539 Unspecified visual disturbance: Secondary | ICD-10-CM | POA: Diagnosis not present

## 2018-09-16 DIAGNOSIS — S91104A Unspecified open wound of right lesser toe(s) without damage to nail, initial encounter: Secondary | ICD-10-CM | POA: Diagnosis not present

## 2018-09-16 DIAGNOSIS — F29 Unspecified psychosis not due to a substance or known physiological condition: Secondary | ICD-10-CM | POA: Diagnosis not present

## 2018-09-16 DIAGNOSIS — R51 Headache: Secondary | ICD-10-CM | POA: Diagnosis not present

## 2018-09-22 DIAGNOSIS — F29 Unspecified psychosis not due to a substance or known physiological condition: Secondary | ICD-10-CM | POA: Diagnosis not present

## 2018-09-22 DIAGNOSIS — R51 Headache: Secondary | ICD-10-CM | POA: Diagnosis not present

## 2018-09-22 DIAGNOSIS — H539 Unspecified visual disturbance: Secondary | ICD-10-CM | POA: Diagnosis not present

## 2018-09-22 DIAGNOSIS — S91104A Unspecified open wound of right lesser toe(s) without damage to nail, initial encounter: Secondary | ICD-10-CM | POA: Diagnosis not present

## 2018-09-22 DIAGNOSIS — I10 Essential (primary) hypertension: Secondary | ICD-10-CM | POA: Diagnosis not present

## 2018-09-22 DIAGNOSIS — R4182 Altered mental status, unspecified: Secondary | ICD-10-CM | POA: Diagnosis not present

## 2018-09-25 DIAGNOSIS — F29 Unspecified psychosis not due to a substance or known physiological condition: Secondary | ICD-10-CM | POA: Diagnosis not present

## 2018-09-25 DIAGNOSIS — S91104A Unspecified open wound of right lesser toe(s) without damage to nail, initial encounter: Secondary | ICD-10-CM | POA: Diagnosis not present

## 2018-09-25 DIAGNOSIS — R51 Headache: Secondary | ICD-10-CM | POA: Diagnosis not present

## 2018-09-25 DIAGNOSIS — H539 Unspecified visual disturbance: Secondary | ICD-10-CM | POA: Diagnosis not present

## 2018-09-25 DIAGNOSIS — R4182 Altered mental status, unspecified: Secondary | ICD-10-CM | POA: Diagnosis not present

## 2018-09-25 DIAGNOSIS — I10 Essential (primary) hypertension: Secondary | ICD-10-CM | POA: Diagnosis not present

## 2018-09-29 DIAGNOSIS — I10 Essential (primary) hypertension: Secondary | ICD-10-CM | POA: Diagnosis not present

## 2018-09-29 DIAGNOSIS — R51 Headache: Secondary | ICD-10-CM | POA: Diagnosis not present

## 2018-09-29 DIAGNOSIS — H539 Unspecified visual disturbance: Secondary | ICD-10-CM | POA: Diagnosis not present

## 2018-09-29 DIAGNOSIS — F29 Unspecified psychosis not due to a substance or known physiological condition: Secondary | ICD-10-CM | POA: Diagnosis not present

## 2018-09-29 DIAGNOSIS — S91104A Unspecified open wound of right lesser toe(s) without damage to nail, initial encounter: Secondary | ICD-10-CM | POA: Diagnosis not present

## 2018-09-29 DIAGNOSIS — R4182 Altered mental status, unspecified: Secondary | ICD-10-CM | POA: Diagnosis not present

## 2018-09-30 DIAGNOSIS — F29 Unspecified psychosis not due to a substance or known physiological condition: Secondary | ICD-10-CM | POA: Diagnosis not present

## 2018-09-30 DIAGNOSIS — R51 Headache: Secondary | ICD-10-CM | POA: Diagnosis not present

## 2018-09-30 DIAGNOSIS — S91104A Unspecified open wound of right lesser toe(s) without damage to nail, initial encounter: Secondary | ICD-10-CM | POA: Diagnosis not present

## 2018-09-30 DIAGNOSIS — H539 Unspecified visual disturbance: Secondary | ICD-10-CM | POA: Diagnosis not present

## 2018-09-30 DIAGNOSIS — R4182 Altered mental status, unspecified: Secondary | ICD-10-CM | POA: Diagnosis not present

## 2018-09-30 DIAGNOSIS — I10 Essential (primary) hypertension: Secondary | ICD-10-CM | POA: Diagnosis not present

## 2018-10-04 ENCOUNTER — Observation Stay (HOSPITAL_COMMUNITY)
Admission: EM | Admit: 2018-10-04 | Discharge: 2018-10-05 | Disposition: A | Discharge status: Home / Self Care | Payer: Medicare Other | Attending: Internal Medicine | Admitting: Internal Medicine

## 2018-10-04 ENCOUNTER — Other Ambulatory Visit: Payer: Self-pay

## 2018-10-04 ENCOUNTER — Encounter (HOSPITAL_COMMUNITY): Payer: Self-pay | Admitting: *Deleted

## 2018-10-04 ENCOUNTER — Emergency Department (HOSPITAL_COMMUNITY): Payer: Medicare Other

## 2018-10-04 DIAGNOSIS — R461 Bizarre personal appearance: Secondary | ICD-10-CM | POA: Diagnosis present

## 2018-10-04 DIAGNOSIS — Z03818 Encounter for observation for suspected exposure to other biological agents ruled out: Secondary | ICD-10-CM | POA: Diagnosis not present

## 2018-10-04 DIAGNOSIS — I609 Nontraumatic subarachnoid hemorrhage, unspecified: Secondary | ICD-10-CM

## 2018-10-04 DIAGNOSIS — E039 Hypothyroidism, unspecified: Secondary | ICD-10-CM | POA: Diagnosis not present

## 2018-10-04 DIAGNOSIS — R4182 Altered mental status, unspecified: Secondary | ICD-10-CM | POA: Diagnosis not present

## 2018-10-04 DIAGNOSIS — F22 Delusional disorders: Principal | ICD-10-CM | POA: Diagnosis present

## 2018-10-04 DIAGNOSIS — C50912 Malignant neoplasm of unspecified site of left female breast: Secondary | ICD-10-CM | POA: Diagnosis present

## 2018-10-04 DIAGNOSIS — C50911 Malignant neoplasm of unspecified site of right female breast: Secondary | ICD-10-CM | POA: Diagnosis present

## 2018-10-04 DIAGNOSIS — I2510 Atherosclerotic heart disease of native coronary artery without angina pectoris: Secondary | ICD-10-CM | POA: Diagnosis not present

## 2018-10-04 DIAGNOSIS — I639 Cerebral infarction, unspecified: Secondary | ICD-10-CM

## 2018-10-04 DIAGNOSIS — R2689 Other abnormalities of gait and mobility: Secondary | ICD-10-CM | POA: Insufficient documentation

## 2018-10-04 DIAGNOSIS — Z20828 Contact with and (suspected) exposure to other viral communicable diseases: Secondary | ICD-10-CM | POA: Diagnosis not present

## 2018-10-04 DIAGNOSIS — E7800 Pure hypercholesterolemia, unspecified: Secondary | ICD-10-CM | POA: Diagnosis present

## 2018-10-04 DIAGNOSIS — Z853 Personal history of malignant neoplasm of breast: Secondary | ICD-10-CM | POA: Diagnosis not present

## 2018-10-04 DIAGNOSIS — S91104A Unspecified open wound of right lesser toe(s) without damage to nail, initial encounter: Secondary | ICD-10-CM | POA: Diagnosis not present

## 2018-10-04 DIAGNOSIS — Z79899 Other long term (current) drug therapy: Secondary | ICD-10-CM | POA: Diagnosis not present

## 2018-10-04 DIAGNOSIS — F29 Unspecified psychosis not due to a substance or known physiological condition: Secondary | ICD-10-CM | POA: Diagnosis not present

## 2018-10-04 DIAGNOSIS — R51 Headache: Secondary | ICD-10-CM | POA: Diagnosis not present

## 2018-10-04 DIAGNOSIS — Z743 Need for continuous supervision: Secondary | ICD-10-CM | POA: Diagnosis not present

## 2018-10-04 DIAGNOSIS — H539 Unspecified visual disturbance: Secondary | ICD-10-CM | POA: Diagnosis not present

## 2018-10-04 DIAGNOSIS — I10 Essential (primary) hypertension: Secondary | ICD-10-CM | POA: Diagnosis not present

## 2018-10-04 DIAGNOSIS — I872 Venous insufficiency (chronic) (peripheral): Secondary | ICD-10-CM

## 2018-10-04 LAB — SALICYLATE LEVEL: Salicylate Lvl: <7 mg/dL (ref 2.8–30.0)

## 2018-10-04 LAB — URINALYSIS, ROUTINE W REFLEX MICROSCOPIC
Bilirubin Urine: NEGATIVE
Glucose, UA: NEGATIVE mg/dL
Hgb urine dipstick: NEGATIVE
Ketones, ur: NEGATIVE mg/dL
Nitrite: NEGATIVE
Protein, ur: NEGATIVE mg/dL
Specific Gravity, Urine: 1.016 (ref 1.005–1.030)
WBC, UA: >50 WBC/hpf — ABNORMAL HIGH (ref 0–5)
pH: 5 (ref 5.0–8.0)

## 2018-10-04 LAB — COMPREHENSIVE METABOLIC PANEL
ALT: 11 U/L (ref 0–44)
AST: 13 U/L — ABNORMAL LOW (ref 15–41)
Albumin: 3.2 g/dL — ABNORMAL LOW (ref 3.5–5.0)
Alkaline Phosphatase: 81 U/L (ref 38–126)
Anion gap: 7 (ref 5–15)
BUN: 16 mg/dL (ref 8–23)
CO2: 23 mmol/L (ref 22–32)
Calcium: 8.7 mg/dL — ABNORMAL LOW (ref 8.9–10.3)
Chloride: 112 mmol/L — ABNORMAL HIGH (ref 98–111)
Creatinine, Ser: 1.06 mg/dL — ABNORMAL HIGH (ref 0.44–1.00)
GFR calc Af Amer: 54 mL/min — ABNORMAL LOW (ref >60)
GFR calc non Af Amer: 47 mL/min — ABNORMAL LOW (ref >60)
Glucose, Bld: 97 mg/dL (ref 70–99)
Potassium: 3.5 mmol/L (ref 3.5–5.1)
Sodium: 142 mmol/L (ref 135–145)
Total Bilirubin: 0.7 mg/dL (ref 0.3–1.2)
Total Protein: 5.8 g/dL — ABNORMAL LOW (ref 6.5–8.1)

## 2018-10-04 LAB — CBC WITH DIFFERENTIAL/PLATELET
Abs Immature Granulocytes: 0.03 10*3/uL (ref 0.00–0.07)
Basophils Absolute: 0 10*3/uL (ref 0.0–0.1)
Basophils Relative: 1 %
Eosinophils Absolute: 0.4 10*3/uL (ref 0.0–0.5)
Eosinophils Relative: 5 %
HCT: 42.4 % (ref 36.0–46.0)
Hemoglobin: 13.7 g/dL (ref 12.0–15.0)
Immature Granulocytes: 0 %
Lymphocytes Relative: 13 %
Lymphs Abs: 1 10*3/uL (ref 0.7–4.0)
MCH: 31.4 pg (ref 26.0–34.0)
MCHC: 32.3 g/dL (ref 30.0–36.0)
MCV: 97 fL (ref 80.0–100.0)
Monocytes Absolute: 0.7 10*3/uL (ref 0.1–1.0)
Monocytes Relative: 9 %
Neutro Abs: 5.5 10*3/uL (ref 1.7–7.7)
Neutrophils Relative %: 72 %
Platelets: 212 10*3/uL (ref 150–400)
RBC: 4.37 MIL/uL (ref 3.87–5.11)
RDW: 13 % (ref 11.5–15.5)
WBC: 7.6 10*3/uL (ref 4.0–10.5)
nRBC: 0 % (ref 0.0–0.2)

## 2018-10-04 LAB — ACETAMINOPHEN LEVEL: Acetaminophen (Tylenol), Serum: <10 ug/mL — ABNORMAL LOW (ref 10–30)

## 2018-10-04 LAB — TSH: TSH: 2.635 u[IU]/mL (ref 0.350–4.500)

## 2018-10-04 LAB — T4, FREE: Free T4: 0.98 ng/dL (ref 0.82–1.77)

## 2018-10-04 LAB — SARS CORONAVIRUS 2 BY RT PCR (HOSPITAL ORDER, PERFORMED IN ~~LOC~~ HOSPITAL LAB): SARS Coronavirus 2: NEGATIVE

## 2018-10-04 MED ORDER — ASPIRIN 325 MG PO TABS: 325.0000 mg | ORAL_TABLET | Freq: Every day | ORAL | Status: DC

## 2018-10-04 MED ORDER — MIDODRINE HCL 2.5 MG PO TABS
2.5000 mg | ORAL_TABLET | Freq: Every day | ORAL | Status: DC
  Administered 2018-10-05: 2.5 mg via ORAL
  Filled 2018-10-04: qty 1

## 2018-10-04 MED ORDER — ACETAMINOPHEN 650 MG RE SUPP: 650.0000 mg | RECTAL | Status: DC | PRN

## 2018-10-04 MED ORDER — ACETAMINOPHEN 325 MG PO TABS: 650.0000 mg | ORAL_TABLET | ORAL | Status: DC | PRN

## 2018-10-04 MED ORDER — LEVOTHYROXINE SODIUM 50 MCG PO TABS
50.0000 ug | ORAL_TABLET | Freq: Every day | ORAL | Status: DC
  Administered 2018-10-04 – 2018-10-05 (×2): 50 ug via ORAL
  Filled 2018-10-04 (×2): qty 1

## 2018-10-04 MED ORDER — LATANOPROST 0.005 % OP SOLN
1.0000 [drp] | Freq: Every day | OPHTHALMIC | Status: DC
  Administered 2018-10-04: 1 [drp] via OPHTHALMIC
  Filled 2018-10-04: qty 2.5

## 2018-10-04 MED ORDER — CEPHALEXIN 250 MG PO CAPS
500.0000 mg | ORAL_CAPSULE | Freq: Once | ORAL | Status: AC
  Administered 2018-10-04: 500 mg via ORAL
  Filled 2018-10-04: qty 2

## 2018-10-04 MED ORDER — LETROZOLE 2.5 MG PO TABS
2.5000 mg | ORAL_TABLET | Freq: Every day | ORAL | Status: DC
  Administered 2018-10-04 – 2018-10-05 (×2): 2.5 mg via ORAL
  Filled 2018-10-04 (×2): qty 1

## 2018-10-04 MED ORDER — SENNOSIDES-DOCUSATE SODIUM 8.6-50 MG PO TABS: 1.0000 | ORAL_TABLET | Freq: Every evening | ORAL | Status: DC | PRN

## 2018-10-04 MED ORDER — STROKE: EARLY STAGES OF RECOVERY BOOK
Freq: Once | Status: AC
  Administered 2018-10-04: 15:00:00
  Filled 2018-10-04: qty 1

## 2018-10-04 MED ORDER — ACETAMINOPHEN 160 MG/5ML PO SOLN: 650.0000 mg | ORAL | Status: DC | PRN

## 2018-10-04 MED ORDER — ASPIRIN 300 MG RE SUPP: 300.0000 mg | Freq: Every day | RECTAL | Status: DC

## 2018-10-04 MED ORDER — SODIUM CHLORIDE 0.9 % IV SOLN: INTRAVENOUS | Status: DC

## 2018-10-04 NOTE — ED Notes (Signed)
This RN assisted patient in ambulating to restroom. 
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
## 2018-10-04 NOTE — ED Notes (Signed)
Attempted report x1. Will call back in 10 minutes. Pt was intially assigned room to 42M, then reassigned to 3W.

## 2018-10-04 NOTE — Progress Notes (Signed)
APS report completed. CSW notes DSS requested a psychiatric evaluation and noted this is an ongoing case.  Lamonte Richer, LCSW, [place] Worker II [phone]

## 2018-10-04 NOTE — ED Notes (Signed)
Admitting provider at pt bedside. 
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
## 2018-10-04 NOTE — Progress Notes (Signed)
Pt has unsafe pacemaker, unable to have her MRIs. RN aware, said she would let ordering doctor know

## 2018-10-04 NOTE — ED Notes (Signed)
Called pt's POA [name] and spoke with him to inform him that [name] will be admitted. [name] stated he was contacted by social work and they also informed him on pt's status.

## 2018-10-05 ENCOUNTER — Observation Stay (HOSPITAL_COMMUNITY): Payer: Medicare Other

## 2018-10-05 ENCOUNTER — Ambulatory Visit: Payer: Medicare Other | Admitting: Podiatry

## 2018-10-05 DIAGNOSIS — I639 Cerebral infarction, unspecified: Secondary | ICD-10-CM | POA: Diagnosis not present

## 2018-10-05 DIAGNOSIS — I614 Nontraumatic intracerebral hemorrhage in cerebellum: Secondary | ICD-10-CM | POA: Diagnosis not present

## 2018-10-05 DIAGNOSIS — F22 Delusional disorders: Secondary | ICD-10-CM | POA: Diagnosis not present

## 2018-10-05 LAB — HEMOGLOBIN A1C
Hgb A1c MFr Bld: 5.8 % — ABNORMAL HIGH (ref 4.8–5.6)
Mean Plasma Glucose: 119.76 mg/dL

## 2018-10-05 LAB — URINE CULTURE: Culture: >=100000 — AB

## 2018-10-05 LAB — LIPID PANEL
Cholesterol: 147 mg/dL (ref 0–200)
HDL: 45 mg/dL (ref >40)
LDL Cholesterol: 87 mg/dL (ref 0–99)
Total CHOL/HDL Ratio: 3.3 RATIO
Triglycerides: 74 mg/dL (ref <150)
VLDL: 15 mg/dL (ref 0–40)

## 2018-10-05 MED ORDER — FUROSEMIDE 20 MG PO TABS: 10.0000 mg | ORAL_TABLET | Freq: Every day | ORAL | Status: DC

## 2018-10-05 NOTE — Progress Notes (Signed)
STROKE TEAM PROGRESS NOTE   INTERVAL HISTORY I have reviewed patient's history of presenting illness in details with her.  She denies any neurological symptoms at onset or currently.  She thinks she is in the hospital because of diarrhea because of bad pizza and popcorn that she ate.  She denies any falls or head injury or headache.  She denies any gait or balance difficulties.  Vitals:   10/05/18 0016 10/05/18 0400 10/05/18 0846 10/05/18 1332  BP: 102/72 118/60 109/61 110/61  Pulse: 60 61 62 67  Resp: 18 17 15 18   Temp:   98.3 F (36.8 C) 98.7 F (37.1 C)  TempSrc:   Oral Oral  SpO2: 92% 95% 98% 96%  Weight:      Height:        CBC:  Recent Labs  Lab 10/04/18 0726  WBC 7.6  NEUTROABS 5.5  HGB 13.7  HCT 42.4  MCV 97.0  PLT 098    Basic Metabolic Panel:  Recent Labs  Lab 10/04/18 0726  NA 142  K 3.5  CL 112*  CO2 23  GLUCOSE 97  BUN 16  CREATININE 1.06*  CALCIUM 8.7*   Lipid Panel:     Component Value Date/Time   CHOL 147 10/05/2018 0644   TRIG 74 10/05/2018 0644   HDL 45 10/05/2018 0644   CHOLHDL 3.3 10/05/2018 0644   VLDL 15 10/05/2018 0644   LDLCALC 87 10/05/2018 0644   HgbA1c:  Lab Results  Component Value Date   HGBA1C 5.8 (H) 10/05/2018   Urine Drug Screen:     Component Value Date/Time   LABOPIA NONE DETECTED 05/05/2016 0246   COCAINSCRNUR NONE DETECTED 05/05/2016 0246   LABBENZ NONE DETECTED 05/05/2016 0246   AMPHETMU NONE DETECTED 05/05/2016 0246   THCU NONE DETECTED 05/05/2016 0246   LABBARB NONE DETECTED 05/05/2016 0246    Alcohol Level     Component Value Date/Time   ETH <5 05/05/2016 0145    IMAGING Ct Head Wo Contrast  Result Date: 10/05/2018 CLINICAL DATA:  Stroke follow-up. Hemorrhage in the cerebellar vermis. EXAM: CT HEAD WITHOUT CONTRAST TECHNIQUE: Contiguous axial images were obtained from the base of the skull through the vertex without intravenous contrast. COMPARISON:  CT head without contrast 10/04/2018 FINDINGS:  Brain: Focal hyperdensity in the cerebellar vermis just posterior to the fourth ventricle is less distinct on today's study, consistent with resolving blood products. No new hemorrhage is present. Moderate atrophy and white matter disease is stable. Basal ganglia are intact. The ventricles are proportionate to the degree of atrophy. No significant extraaxial fluid collection is present. Vascular: Atherosclerotic calcifications are present within the cavernous internal carotid arteries. There is no hyperdense vessel. Skull: Calvarium is intact. No focal lytic or blastic lesions are present. Sinuses/Orbits: Extensive sinus surgery is again noted. Bilateral lens replacements are noted. Globes and orbits are otherwise unremarkable. IMPRESSION: 1. Focal hemorrhage within the cerebellar vermis is less distinct on today's study, consistent with expected evolution of a focal hemorrhage. 2. No new hemorrhage. 3. Stable atrophy and white matter disease. 4. Atherosclerosis. Electronically Signed   By: San Morelle M.D.   On: 10/05/2018 07:02   Ct Head Wo Contrast  Result Date: 10/04/2018 CLINICAL DATA:  Altered mental status. Hallucinations. EXAM: CT HEAD WITHOUT CONTRAST TECHNIQUE: Contiguous axial images were obtained from the base of the skull through the vertex without intravenous contrast. COMPARISON:  CT head 05/05/2016. CT head 07/04/2015. FINDINGS: Brain: Generalized atrophy. Hypoattenuation of white matter, consistent with chronic  microvascular ischemic change. No visible acute stroke or extra-axial fluid. Since the previous CT, the patient has developed a new 8 mm area of hyperattenuation within the central vermis, dorsal to the fourth ventricle, not present on the previous scan. The lesion measures 65-70 Hounsfield units. No surrounding edema. No similar findings elsewhere. No subarachnoid or intraventricular blood. No hydrocephalus. Vascular: Calcification of the cavernous internal carotid arteries  consistent with cerebrovascular atherosclerotic disease. No signs of intracranial large vessel occlusion. Skull: Normal. Negative for fracture or focal lesion. Sinuses/Orbits: No acute finding. BILATERAL cataract extraction. Other: None. Compared with prior studies, no abnormality was seen in this region previously, even in retrospect. IMPRESSION: 1. New/acute 8 mm area of hyperattenuation in the central vermis, dorsal to the fourth ventricle. A spontaneous cerebellar hemorrhage is most likely. No subarachnoid or intraventricular extension. A hyperdense primary or metastatic tumor is possible, but unlikely. 2. Given the degree of atrophy which is present, concern for obstructive hydrocephalus is minimal. 3. Atrophy and small vessel disease, similar to priors. Electronically Signed   By: Staci Righter M.D.   On: 10/04/2018 08:52    PHYSICAL EXAM Pleasant elderly Caucasian lady sitting up in a bedside chair comfortably not in distress. . Afebrile. Head is nontraumatic. Neck is supple without bruit.    Cardiac exam no murmur or gallop. Lungs are clear to auscultation. Distal pulses are well felt. Neurological Exam ;  Awake  Alert oriented x 3. Normal speech and language.eye movements full without nystagmus.fundi were not visualized. Vision acuity and fields appear normal. Hearing is normal. Palatal movements are normal. Face symmetric. Tongue midline. Normal strength, tone, reflexes and coordination.  Mild fine action tremor of outstretched upper extremities absent at rest.  Normal sensation. Gait deferred.  ASSESSMENT/PLAN [name] is a 85 y.o. female with history of CHB, delusional d/o, breat cancer, HLD, orthostatic hypotension, thyroid disease presenting with diarrhea. CT showed incidental hemorrhage.   ICH:    Incidental cerebral vermis hemorrhage, source unclear, not felt to be hypertensive.  Possibly a cavernoma.  Unable to do MRI due to incompatible pacemaker  CT head new 61mm  hyperattenuation central vermis. Small vessel disease. Atrophy.   Repeat CT head next day focal hmg in cerebellar vermis less distinct. Stable Small vessel disease. Atrophy. atherosclerosis   LDL 87  HgbA1c 5.8  Check UDS pending   SCDs for VTE prophylaxis  No antithrombotic prior to admission, now on No antithrombotic given hmg  Therapy recommendations:  No OT, HH PT, RW  Disposition:  pending   Other Stroke Risk Factors  Advanced age  Obesity, Body mass index is 33.18 kg/m., recommend weight loss, diet and exercise as appropriate   Heart block w/ pacer  Other Active Problems  Delusional disorder  Hx invasive ductal bilateral breast cancer  Thyroid disease, labs normal  Hospital day # 0  I have personally obtained history,examined this patient, reviewed notes, independently viewed imaging studies, participated in medical decision making and plan of care.ROS completed by me personally and pertinent positives fully documented  I have made any additions or clarifications directly to the above note.  She presented without any focal neurological symptoms or deficits but due to symptoms of delusion hallucinations had a CT scan done which shows a tiny incidental 8 mm midline cerebellar hemorrhage exact etiology unclear but unlikely to be hypertensive.  Possibly cavernous angioma.  Underlying structural lesion cannot be ruled out as she has an MRI incompatible pacemaker.  Recommend outpatient follow-up in the  stroke clinic and will repeat CT scan in 4 to 6 weeks.  Check urine drug screen.  Discussed with Dr. Eliseo Squires.  Greater than 50% time during this 35-minute visit was spent on counseling and coordination of care about his small intracerebral hemorrhage and discussion about differential diagnosis and plan for follow-up and answering questions  Antony Contras, MD Medical Director [place] Pager: [phone] 10/05/2018 2:25 PM   To contact Stroke Continuity provider,  please refer to [email or web address] After hours, contact General Neurology

## 2018-10-05 NOTE — Discharge Instructions (Signed)
No driving until seen by neurology and outpatient psychiatry

## 2018-10-05 NOTE — Evaluation (Addendum)
Speech Language Pathology Evaluation Patient Details Name: [name] MRN: [id number] DOB: [birth year] Today's Date: 10/05/2018 Time: 0750-0803 SLP Time Calculation (min) (ACUTE ONLY): 13 min  Problem List:  Patient Active Problem List   Diagnosis Date Noted  . 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 Unstable gait 01/19/2018  . Venous stasis dermatitis of both lower extremities 01/09/2018  . Diarrhea, chronic 01/09/2018  . Lower extremity ulceration ([place]) 10/22/2017  . Anxiety disorder 10/22/2017  . PONV (postoperative nausea and vomiting)   . Orthostatic hypotension   . Hypercholesterolemia   . Exogenous obesity   . Rectal bleeding 03/30/2017  . GI bleed 03/28/2017  . BRBPR (bright red blood per rectum) 03/27/2017  . Sigmoid diverticulitis 10/14/2016  . Diverticulitis 10/14/2016  . Diarrhea of presumed infectious origin 05/11/2016  . Varicose veins of bilateral lower extremities with other complications 40/98/1191  . Venous insufficiency 08/25/2015  . Cellulitis 08/09/2015  . Pressure ulcer 08/09/2015  . CAD (coronary artery disease) 07/07/2015  . Open wound of right foot 07/07/2015  . Delusional disorder ([place]) 07/05/2015  . Bereavement reaction 07/05/2015  . Pacemaker at end of battery life 02/23/2015  . Acute GI bleeding 02/15/2015  . Acute kidney injury (nontraumatic) ([place]) 02/15/2015  . Hypothyroidism 02/15/2015  . Left upper quadrant pain   . Osteopenia 12/22/2013  . Postural dizziness 04/05/2013  . S/P placement of cardiac pacemaker 02/26/2013  . Bilateral breast cancer ([place]) 03/25/2011  . Invasive ductal carcinoma of breast ([place]) 03/30/2007   Past Medical History:  Past Medical History:  Diagnosis Date  . Cataract   . Complete heart block (HCC)    s/p PPM implant (MDT) by Dr Blanch Media.  Atrial lead could not be paced at time of the procedure.  She has chronic AV dysociation  . Delusional disorder ([place])   . Exogenous obesity   .  Hypercholesterolemia   . Invasive ductal carcinoma of breast ([place]) 03/2007   BILATERAL BREASTS  . Orthostatic hypotension    treated with midodrine by Dr Rollene [name]  . PONV (postoperative nausea and vomiting)   . Thyroid disease   . Venous insufficiency    Past Surgical History:  Past Surgical History:  Procedure Laterality Date  . ABDOMINAL HYSTERECTOMY    . BREAST LUMPECTOMY Bilateral 2009  . CATARACT EXTRACTION     EYE SURGERY X 2  . CHOLECYSTECTOMY    . COLONOSCOPY N/A 02/16/2015   Procedure: COLONOSCOPY;  Surgeon: Wilford Corner, MD;  Location: [facility] ENDOSCOPY;  Service: Endoscopy;  Laterality: N/A;  . EP IMPLANTABLE DEVICE N/A 02/23/2015   pacemaker generator change (MDT Sensia SR) by Dr Rayann Heman   . ESOPHAGOGASTRODUODENOSCOPY N/A 02/16/2015   Procedure: ESOPHAGOGASTRODUODENOSCOPY (EGD);  Surgeon: Wilford Corner, MD;  Location: [facility] ENDOSCOPY;  Service: Endoscopy;  Laterality: N/A;  . PACEMAKER INSERTION     MDT implanted by Dr Blanch Media.  Atrial lead placement was unsuccessful.  She has chronic AV dysociation  . remote right radical nephrectomy  2009   HPI:  [name] is a 85 y.o. female with medical history significant of hypothyroidism; breast cancer (2008); HLD; pacemaker placement; and delusional disorder presenting with delusions. Paranoia, delusional behavior.  Concerned her neighbors are poisoning her.  Prior admission for psych reasons?  POA not overly concerned.  Incidental(?) finding on head CT - ?spontaneous cerebellar hemorrhage. Dr. Lorraine [name] recommends MRI, will consult.  Also with ?UTI - given Keflex.  ?need psych consult. Most recent repeat CT scan reveals focal hemorrhage within the cerebellar vermis that  is now less distinct on today's study, consistent with expected evolution of a focal hemorrhage, no new hemorrhage, stable atrophy and white matter disease as well as atherosclerosis.    Assessment / Plan / Recommendation Clinical Impression  Pt presents with  functional cognitive abilities that are c/b appropriate orientation x 4, problem solving, memory, speech intelligibility, expressive and auditory abilities. She doesn't present with any deficits attributable to cognition or to recently discovered hemophage. ST services are not indicated at this time. ST to sign off with medical staff aware of pt's concern that she is being poisoned.     SLP Assessment  SLP Recommendation/Assessment: Patient does not need any further Speech Lanaguage Pathology Services SLP Visit Diagnosis: Cognitive communication deficit (R41.841)    Follow Up Recommendations  (psych consult)    Frequency and Duration   N/A        SLP Evaluation Cognition  Overall Cognitive Status: Within Functional Limits for tasks assessed Arousal/Alertness: Awake/alert Orientation Level: Oriented X4 Attention: Selective Selective Attention: Appears intact Memory: Appears intact Awareness: Appears intact Problem Solving: Appears intact       Comprehension  Auditory Comprehension Overall Auditory Comprehension: Appears within functional limits for tasks assessed Visual Recognition/Discrimination Discrimination: Within Function Limits Reading Comprehension Reading Status: Not tested    Expression Expression Primary Mode of Expression: Verbal Verbal Expression Overall Verbal Expression: Appears within functional limits for tasks assessed Written Expression Dominant Hand: Right Written Expression: Not tested   Oral / Motor  Oral Motor/Sensory Function Overall Oral Motor/Sensory Function: Within functional limits Motor Speech Overall Motor Speech: Appears within functional limits for tasks assessed Respiration: Within functional limits Phonation: Normal Resonance: Within functional limits Articulation: Within functional limitis Intelligibility: Intelligible Motor Planning: Witnin functional limits Motor Speech Errors: Not applicable   GO                    [name] 10/05/2018, 9:29 AM

## 2018-10-05 NOTE — Progress Notes (Signed)
Patient discharged home with self.  IV and telemetry discontinued. Discharge information discussed with patient and next of kin/friend, [name].  No medication changes. Patient transported from unit via wheelchair with staff. Will drive her own car home. Friend aware and will assist.  [name]
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
## 2018-10-05 NOTE — Evaluation (Signed)
Occupational Therapy Evaluation Patient Details Name: [name] MRN: [id number] DOB: [birth year] Today's Date: 10/05/2018    History of Present Illness Patient is an 85 year old female who presented to the ED with diarhea and delrium. She belives that her neighbors are trying to poison her. PMH: venous insufficiency, thyroid disease, PONV, orthostatic hypotension, delusional disorder, cataracts, complete heart block    Clinical Impression   Pt admitted with above diagnoses, with factors from delusional disorder and decreased activity tolerance limiting ability to engage in BADL at desired level of ind. PTA pt lives alone, completes BADL ind except has adapted to doing sink level bathing after not feeling comfortable getting in and out of tub when alone despite having shower seat. Pt shares she still drives short distances for groceries and church. She completed ADL mobility at min guard level with cane and toilet transfer at min guard level, mostly for line management. Pt with DOE, but O2 sats 98 despite exertion. Her status is complicated by her delusional disorder, which causes concerns for safety living alone. Pt very adament she was poisoned- brought in stool sample and foods she believed as poisoned. She also mentioned someone "messing up her soap so now she can only use Dove soap". Recommending supervision, possible facility placement purely for safety management. Pt is physically cleared from OT, but continues to need supervision considering her complex delusions. Will continue to follow acutely, but not recommending further OT follow up at this time.    Follow Up Recommendations  No OT follow up;Supervision/Assistance - 24 hour;Other (comment)(possible facility placement for safety awareness, or in home supervision)    Equipment Recommendations  None recommended by OT    Recommendations for Other Services       Precautions / Restrictions Precautions Precautions:  Fall Restrictions Weight Bearing Restrictions: No      Mobility Bed Mobility Overal bed mobility: Needs Assistance Bed Mobility: Supine to Sit     Supine to sit: Supervision     General bed mobility comments: VC's for safety, pt attempting to get out of bed before clearing safe space for mobilizing, lines  Transfers Overall transfer level: Needs assistance   Transfers: Sit to/from Stand Sit to Stand: Min guard         General transfer comment: min guard for balance and safety    Balance Overall balance assessment: Needs assistance Sitting-balance support: No upper extremity supported;Feet supported Sitting balance-Leahy Scale: Good     Standing balance support: Single extremity supported;During functional activity Standing balance-Leahy Scale: Fair Standing balance comment: no appaent LOB with functional mobility and functional tasks                           ADL either performed or assessed with clinical judgement   ADL Overall ADL's : Needs assistance/impaired Eating/Feeding: Modified independent;Sitting   Grooming: Set up;Sitting;Standing;Wash/dry hands   Upper Body Bathing: Set up;Sitting   Lower Body Bathing: Supervison/ safety;Sit to/from stand;Sitting/lateral leans   Upper Body Dressing : Modified independent;Sitting;Standing   Lower Body Dressing: Supervision/safety;Sit to/from stand;Sitting/lateral leans   Toilet Transfer: Min guard;Regular Toilet;Grab bars Armed forces technical officer Details (indicate cue type and reason): with QC, heavy reliance on grab bar but reports that she has higher toilet at home Toileting- Clothing Manipulation and Hygiene: Modified independent;Sitting/lateral lean   Tub/ Shower Transfer: Min guard;Shower seat;Ambulation;Rolling walker   Functional mobility during ADLs: Supervision/safety General ADL Comments: pt overall supervision-min guard for dynamic ADL. Concerns for safety  due to delusions     Vision Patient  Visual Report: No change from baseline       Perception     Praxis      Pertinent Vitals/Pain Pain Assessment: No/denies pain     Hand Dominance Right   Extremity/Trunk Assessment Upper Extremity Assessment Upper Extremity Assessment: Overall WFL for tasks assessed   Lower Extremity Assessment Lower Extremity Assessment: Overall WFL for tasks assessed   Cervical / Trunk Assessment Cervical / Trunk Assessment: Normal   Communication Communication Communication: No difficulties   Cognition Arousal/Alertness: Awake/alert Behavior During Therapy: WFL for tasks assessed/performed Overall Cognitive Status: Within Functional Limits for tasks assessed                                 General Comments: pt AOx3, able to recall room number and topographically manage unit with functional mobility. Although she is still convinced that she is being poisoned, brought her stool and food with the 'poison' to be tested at the hospital   General Comments       Exercises     Shoulder Instructions      Home Living Family/patient expects to be discharged to:: Private residence Living Arrangements: Alone Available Help at Discharge: Friend(s) Type of Home: House Home Access: Stairs to enter [employer] of Steps: 4 Entrance Stairs-Rails: Left Home Layout: One level     Bathroom Shower/Tub: Teacher, early years/pre: Standard     Home Equipment: Tub bench   Additional Comments: feels uncomfortable stepping into her tub so she does baths at the sink   Lives With: Alone    Prior Functioning/Environment Level of Independence: Independent with assistive device(s)        Comments: pt uses QC at baseline; she does bathe at the sink due to discomfort stepping over tub despite having seat; she drives short distances to get groceries and go to church, but no further        OT Problem List: Decreased strength;Decreased activity tolerance;Decreased  safety awareness      OT Treatment/Interventions: Self-care/ADL training;DME and/or AE instruction;Therapeutic activities;Balance training;Therapeutic exercise;Energy conservation;Patient/family education    OT Goals(Current goals can be found in the care plan section) Acute Rehab OT Goals Patient Stated Goal: to go home soon OT Goal Formulation: With patient Time For Goal Achievement: 10/19/18 Potential to Achieve Goals: Good  OT Frequency: Min 2X/week   Barriers to D/C:            Co-evaluation PT/OT/SLP Co-Evaluation/Treatment: Yes Reason for Co-Treatment: Necessary to address cognition/behavior during functional activity;For patient/therapist safety PT goals addressed during session: Mobility/safety with mobility;Balance;Proper use of DME OT goals addressed during session: ADL's and self-care;Proper use of Adaptive equipment and DME      AM-PAC OT "6 Clicks" Daily Activity     Outcome Measure Help from another person eating meals?: None Help from another person taking care of personal grooming?: None Help from another person toileting, which includes using toliet, bedpan, or urinal?: A Little Help from another person bathing (including washing, rinsing, drying)?: A Little Help from another person to put on and taking off regular upper body clothing?: None Help from another person to put on and taking off regular lower body clothing?: A Little 6 Click Score: 21   End of Session Equipment Utilized During Treatment: Gait belt;Other (comment)(cane)  Activity Tolerance: Patient tolerated treatment well Patient left: in chair;with call bell/phone within reach;with  chair alarm set  OT Visit Diagnosis: Other symptoms and signs involving cognitive function;Other (comment);Other abnormalities of gait and mobility (R26.89)(delusional disorder)                Time: 3958-4417 OT Time Calculation (min): 28 min Charges:  OT General Charges $OT Visit: 1 Visit OT Evaluation $OT Eval  Moderate Complexity: 1 Mod  Zenovia Jarred, MSOT, OTR/L [place] OT/ Acute Relief OT [facility] Office: [phone]  Zenovia Jarred 10/05/2018, 1:02 PM

## 2018-10-05 NOTE — Evaluation (Signed)
Physical Therapy Evaluation Patient Details Name: [name] MRN: [id number] DOB: 09/16/1927 Today's Date: 10/05/2018   History of Present Illness  Patient is an 85 year old female who presented to the ED with diarhea and delrium. She belives that her neighbors are trying to poison her. PMH: venous insufficiency, thyroid disease, PONV, orthostatic hypotension, delusional disorder, cataracts, complete heart block   Clinical Impression  Patient was able to ambulate with a cane. She had mild dyspnea but her Sao2 remained > 90%. She had no loss of balance. She does continue to insists that her neighbors are trying to posoin her. It raises concerns about her safety awareness and safety at home. She may benefit from home health for endurance training. She may also benefit from a transfer to an ALF or a SNF 2nd to cognition. She was very pleasant with therapy.     Follow Up Recommendations Home health PT;SNF (for safety awareness);Supervision - Intermittent    Equipment Recommendations  Rolling walker with 5" wheels    Recommendations for Other Services       Precautions / Restrictions Precautions Precautions: None Restrictions Weight Bearing Restrictions: No      Mobility  Bed Mobility Overal bed mobility: Needs Assistance Bed Mobility: Supine to Sit     Supine to sit: Supervision     General bed mobility comments: Patient   Transfers Overall transfer level: Needs assistance   Transfers: Sit to/from Stand Sit to Stand: Min guard         General transfer comment: MIn gaurd for balance   Ambulation/Gait Ambulation/Gait assistance: Min guard Gait Distance (Feet): 100 Feet Assistive device: Quad cane Gait Pattern/deviations: Step-through pattern Gait velocity: decreased    General Gait Details: mild dyspnea with gait. slow but steady gait pattern with wide based quad cane.   Stairs            Wheelchair Mobility    Modified Rankin (Stroke Patients  Only)       Balance Overall balance assessment: Needs assistance Sitting-balance support: No upper extremity supported;Feet supported Sitting balance-Leahy Scale: Good     Standing balance support: Single extremity supported Standing balance-Leahy Scale: Fair Standing balance comment: no loss of balance with gait                              Pertinent Vitals/Pain Pain Assessment: No/denies pain    Home Living Family/patient expects to be discharged to:: Private residence Living Arrangements: Alone Available Help at Discharge: Friend(s) Type of Home: House           Additional Comments: feels uncomfortable stepping into her tub so she does baths at the sink     Prior Function Level of Independence: Independent with assistive device(s)         Comments: Patient reported using a wide based quad cane      Hand Dominance   Dominant Hand: Right    Extremity/Trunk Assessment   Upper Extremity Assessment Upper Extremity Assessment: Defer to OT evaluation    Lower Extremity Assessment Lower Extremity Assessment: Overall WFL for tasks assessed    Cervical / Trunk Assessment Cervical / Trunk Assessment: Normal  Communication   Communication: No difficulties  Cognition Arousal/Alertness: Awake/alert Behavior During Therapy: WFL for tasks assessed/performed Overall Cognitive Status: Within Functional Limits for tasks assessed  General Comments: depsite reports of delusions patient appeared to be AOx3. She knwew where her room was. She is convinced though that her neighbors are trying to poison her.       General Comments      Exercises     Assessment/Plan    PT Assessment Patient needs continued PT services  PT Problem List Decreased cognition;Decreased strength;Decreased activity tolerance;Decreased mobility       PT Treatment Interventions DME instruction;Gait training;Functional mobility  training;Therapeutic activities;Therapeutic exercise;Neuromuscular re-education;Patient/family education    PT Goals (Current goals can be found in the Care Plan section)  Acute Rehab PT Goals Patient Stated Goal: to go home  PT Goal Formulation: With patient Time For Goal Achievement: 10/12/18 Potential to Achieve Goals: Good    Frequency Min 3X/week   Barriers to discharge Inaccessible home environment      Co-evaluation PT/OT/SLP Co-Evaluation/Treatment: Yes(Notes reported paranoid delrium ) Reason for Co-Treatment: Complexity of the patient's impairments (multi-system involvement);Necessary to address cognition/behavior during functional activity;For patient/therapist safety;To address functional/ADL transfers PT goals addressed during session: Mobility/safety with mobility;Balance;Proper use of DME;Strengthening/ROM         AM-PAC PT "6 Clicks" Mobility  Outcome Measure Help needed turning from your back to your side while in a flat bed without using bedrails?: A Little Help needed moving from lying on your back to sitting on the side of a flat bed without using bedrails?: A Little Help needed moving to and from a bed to a chair (including a wheelchair)?: A Little Help needed standing up from a chair using your arms (e.g., wheelchair or bedside chair)?: A Little Help needed to walk in hospital room?: A Little Help needed climbing 3-5 steps with a railing? : A Little 6 Click Score: 18    End of Session Equipment Utilized During Treatment: Gait belt Activity Tolerance: Patient tolerated treatment well Patient left: in chair;with call bell/phone within reach;with chair alarm set Nurse Communication: Mobility status PT Visit Diagnosis: Other abnormalities of gait and mobility (R26.89)    Time: 6761-9509 PT Time Calculation (min) (ACUTE ONLY): 28 min   Charges:   PT Evaluation $PT Eval Moderate Complexity: 1 Mod            Carney Living PT DPT  10/05/2018, 12:45  PM

## 2018-10-06 ENCOUNTER — Telehealth: Payer: Self-pay | Admitting: Family Medicine

## 2018-10-06 ENCOUNTER — Telehealth: Payer: Self-pay | Admitting: *Deleted

## 2018-10-06 DIAGNOSIS — C50911 Malignant neoplasm of unspecified site of right female breast: Secondary | ICD-10-CM | POA: Diagnosis not present

## 2018-10-06 DIAGNOSIS — I609 Nontraumatic subarachnoid hemorrhage, unspecified: Secondary | ICD-10-CM | POA: Diagnosis not present

## 2018-10-06 DIAGNOSIS — I739 Peripheral vascular disease, unspecified: Secondary | ICD-10-CM | POA: Diagnosis not present

## 2018-10-06 DIAGNOSIS — F29 Unspecified psychosis not due to a substance or known physiological condition: Secondary | ICD-10-CM | POA: Diagnosis not present

## 2018-10-06 DIAGNOSIS — F22 Delusional disorders: Secondary | ICD-10-CM | POA: Diagnosis not present

## 2018-10-06 DIAGNOSIS — R51 Headache: Secondary | ICD-10-CM | POA: Diagnosis not present

## 2018-10-06 DIAGNOSIS — C50912 Malignant neoplasm of unspecified site of left female breast: Secondary | ICD-10-CM | POA: Diagnosis not present

## 2018-10-06 DIAGNOSIS — I10 Essential (primary) hypertension: Secondary | ICD-10-CM | POA: Diagnosis not present

## 2018-10-06 DIAGNOSIS — H539 Unspecified visual disturbance: Secondary | ICD-10-CM | POA: Diagnosis not present

## 2018-10-06 DIAGNOSIS — Z743 Need for continuous supervision: Secondary | ICD-10-CM | POA: Diagnosis not present

## 2018-10-06 DIAGNOSIS — R4182 Altered mental status, unspecified: Secondary | ICD-10-CM | POA: Diagnosis not present

## 2018-10-06 DIAGNOSIS — S91104A Unspecified open wound of right lesser toe(s) without damage to nail, initial encounter: Secondary | ICD-10-CM | POA: Diagnosis not present

## 2018-10-06 DIAGNOSIS — R2689 Other abnormalities of gait and mobility: Secondary | ICD-10-CM | POA: Diagnosis not present

## 2018-10-06 NOTE — Telephone Encounter (Signed)
Transition Care Management Follow-up Telephone Call   Date discharged? October 05, 2018   How have you been since you were released from the hospital? I been doing good    Do you understand why you were in the hospital? yes   Do you understand the discharge instructions? yes   Where were you discharged to? Home    Items Reviewed:  Medications reviewed: yes  Allergies reviewed: yes  Dietary changes reviewed: N/A  Referrals reviewed: yes   Functional Questionnaire:   Activities of Daily Living (ADLs):   She states they are independent in the following: ambulation, bathing and hygiene, feeding, continence, grooming, toileting and dressing States they require assistance with the following: N/A   Any transportation issues/concerns?: no   Any patient concerns? no   Confirmed importance and date/time of follow-up visits scheduled yes  Provider Appointment booked with  [name] on 10/16/2018 at 9:30 AM   Confirmed with patient if condition begins to worsen call PCP or go to the ER.  Patient was given the office number and encouraged to call back with question or concerns.  :  Yes

## 2018-10-06 NOTE — Telephone Encounter (Signed)
Copied from [place] [id number]. Topic: Quick Communication - Home Health Verbal Orders >> Oct 06, 2018  3:06 PM Erick Blinks wrote: Caller/Agency: [place] Number: [phone] Requesting OT/PT/Skilled Nursing/Social Work/Speech Therapy: Home Nursing  Frequency: Nursing  1 week 4 assessment and observation.

## 2018-10-07 NOTE — Telephone Encounter (Signed)
Verbal orders given to [facility]

## 2018-10-07 NOTE — Telephone Encounter (Signed)
Verbal authorization for requested services can be given. ?Thanks, ?BJ ?

## 2018-10-14 DIAGNOSIS — F22 Delusional disorders: Secondary | ICD-10-CM | POA: Diagnosis not present

## 2018-10-14 DIAGNOSIS — C50912 Malignant neoplasm of unspecified site of left female breast: Secondary | ICD-10-CM | POA: Diagnosis not present

## 2018-10-14 DIAGNOSIS — I10 Essential (primary) hypertension: Secondary | ICD-10-CM | POA: Diagnosis not present

## 2018-10-14 DIAGNOSIS — I609 Nontraumatic subarachnoid hemorrhage, unspecified: Secondary | ICD-10-CM | POA: Diagnosis not present

## 2018-10-14 DIAGNOSIS — F29 Unspecified psychosis not due to a substance or known physiological condition: Secondary | ICD-10-CM | POA: Diagnosis not present

## 2018-10-14 DIAGNOSIS — C50911 Malignant neoplasm of unspecified site of right female breast: Secondary | ICD-10-CM | POA: Diagnosis not present

## 2018-10-15 ENCOUNTER — Telehealth: Payer: Self-pay | Admitting: Family Medicine

## 2018-10-15 DIAGNOSIS — F22 Delusional disorders: Secondary | ICD-10-CM | POA: Diagnosis not present

## 2018-10-15 DIAGNOSIS — C50911 Malignant neoplasm of unspecified site of right female breast: Secondary | ICD-10-CM | POA: Diagnosis not present

## 2018-10-15 DIAGNOSIS — I10 Essential (primary) hypertension: Secondary | ICD-10-CM | POA: Diagnosis not present

## 2018-10-15 DIAGNOSIS — C50912 Malignant neoplasm of unspecified site of left female breast: Secondary | ICD-10-CM | POA: Diagnosis not present

## 2018-10-15 DIAGNOSIS — F29 Unspecified psychosis not due to a substance or known physiological condition: Secondary | ICD-10-CM | POA: Diagnosis not present

## 2018-10-15 DIAGNOSIS — I609 Nontraumatic subarachnoid hemorrhage, unspecified: Secondary | ICD-10-CM | POA: Diagnosis not present

## 2018-10-15 NOTE — Telephone Encounter (Signed)
Home Health Verbal Orders - Caller/Agency: [place] Callback Number: [phone] secure  Requesting OT/PT/Skilled Nursing/Social Work/Speech Therapy: PT Frequency: 2x for 2wks, 1x for 1wk

## 2018-10-16 ENCOUNTER — Other Ambulatory Visit: Payer: Self-pay

## 2018-10-16 ENCOUNTER — Ambulatory Visit (INDEPENDENT_AMBULATORY_CARE_PROVIDER_SITE_OTHER): Payer: Medicare Other | Admitting: Family Medicine

## 2018-10-16 ENCOUNTER — Encounter: Payer: Self-pay | Admitting: Family Medicine

## 2018-10-16 VITALS — BP 130/70 | HR 90 | Temp 98.2°F | Resp 16 | Ht 67.0 in | Wt 222.0 lb

## 2018-10-16 DIAGNOSIS — I872 Venous insufficiency (chronic) (peripheral): Secondary | ICD-10-CM | POA: Diagnosis not present

## 2018-10-16 DIAGNOSIS — E7800 Pure hypercholesterolemia, unspecified: Secondary | ICD-10-CM | POA: Diagnosis not present

## 2018-10-16 DIAGNOSIS — I609 Nontraumatic subarachnoid hemorrhage, unspecified: Secondary | ICD-10-CM

## 2018-10-16 DIAGNOSIS — I951 Orthostatic hypotension: Secondary | ICD-10-CM | POA: Diagnosis not present

## 2018-10-16 DIAGNOSIS — F22 Delusional disorders: Secondary | ICD-10-CM

## 2018-10-16 MED ORDER — FUROSEMIDE 20 MG PO TABS: 10.0000 mg | ORAL_TABLET | Freq: Every day | ORAL | 1 refills | Status: DC

## 2018-10-16 MED ORDER — MIDODRINE HCL 2.5 MG PO TABS: 2.5000 mg | ORAL_TABLET | Freq: Every day | ORAL | 0 refills | Status: DC

## 2018-10-16 NOTE — Assessment & Plan Note (Addendum)
It has improved and ulcers have healed. Good skin care discussed as well as trauma avoidance. Continue Furosemide 20 mg daily. LE elevation above waist level and few times in the afternoon.
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
## 2018-10-16 NOTE — Telephone Encounter (Signed)
Verbal authorization can be given for requested services. Thanks, BJ 

## 2018-10-16 NOTE — Assessment & Plan Note (Addendum)
She is not longer on Aspirin, also recommend avoiding NSAID's. Neurology appt information pending. She is not interested in following with neuro but states that she would consider keeping appt  Clearly instructed about warning signs. Needs head CT repeated in 4-6 weeks (last week of 10/2018)

## 2018-10-16 NOTE — Assessment & Plan Note (Addendum)
Last FLP in 09/2018 in normal range. She is not on statin med. Recommend following low fat diet for now. Keeps appt with cardiologist,Dr Martinique.

## 2018-10-16 NOTE — Telephone Encounter (Signed)
Left message for patient to call back. Voice mail did not state a name. CRM created.

## 2018-10-16 NOTE — Patient Instructions (Signed)
A few things to remember from today's visit:   Hypercholesterolemia  Anxiety disorder, unspecified type  Venous stasis dermatitis of both lower extremities - Plan: furosemide (LASIX) 20 MG tablet  Be careful with falls. No changes in medications. I will see you back in 5 months.   Please be sure medication list is accurate. If a new problem present, please set up appointment sooner than planned today.
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
## 2018-10-16 NOTE — Assessment & Plan Note (Signed)
Side effects of Midodrine discussed. Recommend monitoring BP at home. Fall precautions discussed. Instructed about warning signs.

## 2018-10-20 DIAGNOSIS — I10 Essential (primary) hypertension: Secondary | ICD-10-CM | POA: Diagnosis not present

## 2018-10-20 DIAGNOSIS — F29 Unspecified psychosis not due to a substance or known physiological condition: Secondary | ICD-10-CM | POA: Diagnosis not present

## 2018-10-20 DIAGNOSIS — C50912 Malignant neoplasm of unspecified site of left female breast: Secondary | ICD-10-CM | POA: Diagnosis not present

## 2018-10-20 DIAGNOSIS — F22 Delusional disorders: Secondary | ICD-10-CM | POA: Diagnosis not present

## 2018-10-20 DIAGNOSIS — C50911 Malignant neoplasm of unspecified site of right female breast: Secondary | ICD-10-CM | POA: Diagnosis not present

## 2018-10-20 DIAGNOSIS — I609 Nontraumatic subarachnoid hemorrhage, unspecified: Secondary | ICD-10-CM | POA: Diagnosis not present

## 2018-10-20 NOTE — Telephone Encounter (Signed)
Left message for [name] to return call to office for verbal orders.

## 2018-10-21 DIAGNOSIS — I609 Nontraumatic subarachnoid hemorrhage, unspecified: Secondary | ICD-10-CM | POA: Diagnosis not present

## 2018-10-21 DIAGNOSIS — F22 Delusional disorders: Secondary | ICD-10-CM | POA: Diagnosis not present

## 2018-10-21 DIAGNOSIS — C50912 Malignant neoplasm of unspecified site of left female breast: Secondary | ICD-10-CM | POA: Diagnosis not present

## 2018-10-21 DIAGNOSIS — I10 Essential (primary) hypertension: Secondary | ICD-10-CM | POA: Diagnosis not present

## 2018-10-21 DIAGNOSIS — C50911 Malignant neoplasm of unspecified site of right female breast: Secondary | ICD-10-CM | POA: Diagnosis not present

## 2018-10-21 DIAGNOSIS — F29 Unspecified psychosis not due to a substance or known physiological condition: Secondary | ICD-10-CM | POA: Diagnosis not present

## 2018-10-21 NOTE — Telephone Encounter (Signed)
LVM to return call for verbal orders.

## 2018-10-21 NOTE — Telephone Encounter (Signed)
Verbal orders given to [facility] as requested.

## 2018-10-22 DIAGNOSIS — I10 Essential (primary) hypertension: Secondary | ICD-10-CM | POA: Diagnosis not present

## 2018-10-22 DIAGNOSIS — I609 Nontraumatic subarachnoid hemorrhage, unspecified: Secondary | ICD-10-CM | POA: Diagnosis not present

## 2018-10-22 DIAGNOSIS — C50911 Malignant neoplasm of unspecified site of right female breast: Secondary | ICD-10-CM | POA: Diagnosis not present

## 2018-10-22 DIAGNOSIS — F22 Delusional disorders: Secondary | ICD-10-CM | POA: Diagnosis not present

## 2018-10-22 DIAGNOSIS — C50912 Malignant neoplasm of unspecified site of left female breast: Secondary | ICD-10-CM | POA: Diagnosis not present

## 2018-10-22 DIAGNOSIS — F29 Unspecified psychosis not due to a substance or known physiological condition: Secondary | ICD-10-CM | POA: Diagnosis not present

## 2018-10-26 ENCOUNTER — Ambulatory Visit (INDEPENDENT_AMBULATORY_CARE_PROVIDER_SITE_OTHER): Payer: Medicare Other | Admitting: *Deleted

## 2018-10-26 ENCOUNTER — Ambulatory Visit (INDEPENDENT_AMBULATORY_CARE_PROVIDER_SITE_OTHER): Payer: Medicare Other | Admitting: Podiatry

## 2018-10-26 ENCOUNTER — Encounter: Payer: Self-pay | Admitting: Podiatry

## 2018-10-26 ENCOUNTER — Other Ambulatory Visit: Payer: Self-pay

## 2018-10-26 DIAGNOSIS — L601 Onycholysis: Secondary | ICD-10-CM

## 2018-10-26 DIAGNOSIS — B351 Tinea unguium: Secondary | ICD-10-CM

## 2018-10-26 DIAGNOSIS — L989 Disorder of the skin and subcutaneous tissue, unspecified: Secondary | ICD-10-CM

## 2018-10-26 DIAGNOSIS — L603 Nail dystrophy: Secondary | ICD-10-CM | POA: Diagnosis not present

## 2018-10-26 DIAGNOSIS — I442 Atrioventricular block, complete: Secondary | ICD-10-CM | POA: Diagnosis not present

## 2018-10-26 DIAGNOSIS — M79676 Pain in unspecified toe(s): Secondary | ICD-10-CM

## 2018-10-27 ENCOUNTER — Telehealth: Payer: Self-pay | Admitting: Family Medicine

## 2018-10-27 DIAGNOSIS — H401132 Primary open-angle glaucoma, bilateral, moderate stage: Secondary | ICD-10-CM | POA: Diagnosis not present

## 2018-10-27 DIAGNOSIS — F22 Delusional disorders: Secondary | ICD-10-CM | POA: Diagnosis not present

## 2018-10-27 DIAGNOSIS — I10 Essential (primary) hypertension: Secondary | ICD-10-CM | POA: Diagnosis not present

## 2018-10-27 DIAGNOSIS — I609 Nontraumatic subarachnoid hemorrhage, unspecified: Secondary | ICD-10-CM | POA: Diagnosis not present

## 2018-10-27 DIAGNOSIS — C50911 Malignant neoplasm of unspecified site of right female breast: Secondary | ICD-10-CM | POA: Diagnosis not present

## 2018-10-27 DIAGNOSIS — F29 Unspecified psychosis not due to a substance or known physiological condition: Secondary | ICD-10-CM | POA: Diagnosis not present

## 2018-10-27 DIAGNOSIS — Z961 Presence of intraocular lens: Secondary | ICD-10-CM | POA: Diagnosis not present

## 2018-10-27 DIAGNOSIS — H43811 Vitreous degeneration, right eye: Secondary | ICD-10-CM | POA: Diagnosis not present

## 2018-10-27 DIAGNOSIS — C50912 Malignant neoplasm of unspecified site of left female breast: Secondary | ICD-10-CM | POA: Diagnosis not present

## 2018-10-27 LAB — CUP PACEART REMOTE DEVICE CHECK
Battery Impedance: 1154 Ohm
Battery Remaining Longevity: 55 mo
Battery Voltage: 2.76 V
Brady Statistic RV Percent Paced: 100 %
Date Time Interrogation Session: 20200629105106
Implantable Lead Implant Date: 20141208
Implantable Lead Location: 753860
Implantable Lead Model: 4076
Implantable Pulse Generator Implant Date: 20161027
Lead Channel Impedance Value: 0 Ohm
Lead Channel Impedance Value: 390 Ohm
Lead Channel Pacing Threshold Amplitude: 0.875 V
Lead Channel Pacing Threshold Pulse Width: 0.4 ms
Lead Channel Setting Pacing Amplitude: 2 V
Lead Channel Setting Pacing Pulse Width: 0.4 ms
Lead Channel Setting Sensing Sensitivity: 2 mV

## 2018-10-27 NOTE — Telephone Encounter (Signed)
Pt stated she would prefer Lasix 10 MG but the Rx was for 20 MG and she has to cut them.  Medication: furosemide (LASIX) 20 MG tablet and letrozole (FEMARA) 2.5 MG tablet  Has the patient contacted their pharmacy? yes   Preferred Pharmacy (with phone number or street name):  [facility] [place], [place] [facility] [phone] (Phone) [phone] (Fax)   Agent: Please be advised that RX refills may take up to 3 business days. We ask that you follow-up with your pharmacy.

## 2018-10-28 NOTE — Telephone Encounter (Signed)
Spoke with patient and gave recommendations per [name]. Patient verbalized understanding. Patient stated that she hasn't followed up with oncology in about 3 years, it has been about 10 years since she has had cancer. Patient stated that she has been taking every since and wanted to know if PCP thought she still needed to. Please advise.

## 2018-10-28 NOTE — Telephone Encounter (Signed)
It is ok to continue low dose as far as she is tolerating well and LE edema is stable. There is no Furosemide 10 mg,lower tab dose is 20 mg. She can take whole tab every other day if this is easy for her.  In regard to [facility] is an oncology prescribed medication. No records of who prescribed it and when was the last time she filled out Rx.Also when was the last time she followed with oncology and for how long is she supposed to be on medication.  Thanks, BJ

## 2018-10-28 NOTE — Progress Notes (Signed)
    Subjective: Patient is a 85 y.o. female presenting to the office today for follow up evaluation of painful callus lesions noted to the bilateral feet. She has not had any recent treatment for the symptoms. Applying pressure to the areas increases the pain.  Patient also complains of elongated, thickened nails that cause pain while ambulating in shoes. She is unable to trim her own nails. Patient presents today for further treatment and evaluation.  Past Medical History:  Diagnosis Date  . Cataract   . Complete heart block (HCC)    s/p PPM implant (MDT) by Dr Blanch Media.  Atrial lead could not be paced at time of the procedure.  She has chronic AV dysociation  . Delusional disorder ([place])   . Exogenous obesity   . Hypercholesterolemia   . Invasive ductal carcinoma of breast ([place]) 03/2007   BILATERAL BREASTS  . Orthostatic hypotension    treated with midodrine by Dr Rollene [name]  . PONV (postoperative nausea and vomiting)   . Thyroid disease   . Venous insufficiency     Objective:  Physical Exam General: Alert and oriented x3 in no acute distress  Dermatology: Hyperkeratotic lesions present on the bilateral feet x 4. Pain on palpation with a central nucleated core noted. Skin is warm, dry and supple bilateral lower extremities. Negative for open lesions or macerations. Nails are tender, long, thickened and dystrophic with subungual debris, consistent with onychomycosis, 1-5 bilateral. No signs of infection noted. Partially detached nail plate of the left hallux noted.   Vascular: Palpable pedal pulses bilaterally. No edema or erythema noted. Capillary refill within normal limits.  Neurological: Epicritic and protective threshold absent bilaterally.   Musculoskeletal Exam: Pain on palpation at the keratotic lesion noted. Range of motion within normal limits bilateral. Muscle strength 5/5 in all groups bilateral.  Assessment: 1. Onychodystrophic nails 1-5 bilateral with hyperkeratosis of  nails.  2. Onychomycosis of nail due to dermatophyte bilateral 3. Pre-ulcerative callus lesions noted to the bilateral feet x 4 4. Partially detached nail plate left hallux    Plan of Care:  1. Patient evaluated. 2. Excisional debridement of keratoic lesion using a chisel blade was performed without incident.  3. Dressed with light dressing. 4. Mechanical debridement of nails 1-5 bilaterally performed using a nail nipper. Filed with dremel without incident.  5. Discussed treatment alternatives and plan of care. Explained nail avulsion procedure and post procedure course to patient. 6. Patient opted for total temporary nail avulsion of the left great toe. No lidocaine utilized due to neuropathy.  7. Light dressing applied. 8. Patient is to return to the clinic in 3 months for routine care.   Edrick Kins, DPM [facility]  Dr. Edrick Kins, [address]                                        [place], [place] [address]                Office [phone]  Fax [phone]

## 2018-10-28 NOTE — Telephone Encounter (Signed)
Patient would like to decrease dose of Lasix, prescribed 20 mg, have been taking 10 mg, would like to know if this is okay. Please advise.

## 2018-10-29 DIAGNOSIS — I10 Essential (primary) hypertension: Secondary | ICD-10-CM | POA: Diagnosis not present

## 2018-10-29 DIAGNOSIS — C50912 Malignant neoplasm of unspecified site of left female breast: Secondary | ICD-10-CM | POA: Diagnosis not present

## 2018-10-29 DIAGNOSIS — C50911 Malignant neoplasm of unspecified site of right female breast: Secondary | ICD-10-CM | POA: Diagnosis not present

## 2018-10-29 DIAGNOSIS — F22 Delusional disorders: Secondary | ICD-10-CM | POA: Diagnosis not present

## 2018-10-29 DIAGNOSIS — I609 Nontraumatic subarachnoid hemorrhage, unspecified: Secondary | ICD-10-CM | POA: Diagnosis not present

## 2018-10-29 DIAGNOSIS — F29 Unspecified psychosis not due to a substance or known physiological condition: Secondary | ICD-10-CM | POA: Diagnosis not present

## 2018-11-03 DIAGNOSIS — I609 Nontraumatic subarachnoid hemorrhage, unspecified: Secondary | ICD-10-CM | POA: Diagnosis not present

## 2018-11-03 DIAGNOSIS — F22 Delusional disorders: Secondary | ICD-10-CM | POA: Diagnosis not present

## 2018-11-03 DIAGNOSIS — I10 Essential (primary) hypertension: Secondary | ICD-10-CM | POA: Diagnosis not present

## 2018-11-03 DIAGNOSIS — C50911 Malignant neoplasm of unspecified site of right female breast: Secondary | ICD-10-CM | POA: Diagnosis not present

## 2018-11-03 DIAGNOSIS — F29 Unspecified psychosis not due to a substance or known physiological condition: Secondary | ICD-10-CM | POA: Diagnosis not present

## 2018-11-03 DIAGNOSIS — C50912 Malignant neoplasm of unspecified site of left female breast: Secondary | ICD-10-CM | POA: Diagnosis not present

## 2018-11-04 ENCOUNTER — Telehealth: Payer: Self-pay | Admitting: Family Medicine

## 2018-11-04 DIAGNOSIS — C50912 Malignant neoplasm of unspecified site of left female breast: Secondary | ICD-10-CM | POA: Diagnosis not present

## 2018-11-04 DIAGNOSIS — I609 Nontraumatic subarachnoid hemorrhage, unspecified: Secondary | ICD-10-CM | POA: Diagnosis not present

## 2018-11-04 DIAGNOSIS — F29 Unspecified psychosis not due to a substance or known physiological condition: Secondary | ICD-10-CM | POA: Diagnosis not present

## 2018-11-04 DIAGNOSIS — C50911 Malignant neoplasm of unspecified site of right female breast: Secondary | ICD-10-CM | POA: Diagnosis not present

## 2018-11-04 DIAGNOSIS — I10 Essential (primary) hypertension: Secondary | ICD-10-CM | POA: Diagnosis not present

## 2018-11-04 DIAGNOSIS — F22 Delusional disorders: Secondary | ICD-10-CM | POA: Diagnosis not present

## 2018-11-04 NOTE — Telephone Encounter (Signed)
Almira would like verbal order to continue PT 2x1 , 1x3. Pt also would like nurse to call her back concerning water pill and femara pt would like to know if she needs to continue. Pt did not get any more refills. [facility] [address]

## 2018-11-05 DIAGNOSIS — F22 Delusional disorders: Secondary | ICD-10-CM | POA: Diagnosis not present

## 2018-11-05 DIAGNOSIS — R51 Headache: Secondary | ICD-10-CM | POA: Diagnosis not present

## 2018-11-05 DIAGNOSIS — H539 Unspecified visual disturbance: Secondary | ICD-10-CM | POA: Diagnosis not present

## 2018-11-05 DIAGNOSIS — I10 Essential (primary) hypertension: Secondary | ICD-10-CM | POA: Diagnosis not present

## 2018-11-05 DIAGNOSIS — I739 Peripheral vascular disease, unspecified: Secondary | ICD-10-CM | POA: Diagnosis not present

## 2018-11-05 DIAGNOSIS — F29 Unspecified psychosis not due to a substance or known physiological condition: Secondary | ICD-10-CM | POA: Diagnosis not present

## 2018-11-05 DIAGNOSIS — I609 Nontraumatic subarachnoid hemorrhage, unspecified: Secondary | ICD-10-CM | POA: Diagnosis not present

## 2018-11-05 DIAGNOSIS — Z743 Need for continuous supervision: Secondary | ICD-10-CM | POA: Diagnosis not present

## 2018-11-05 DIAGNOSIS — R4182 Altered mental status, unspecified: Secondary | ICD-10-CM | POA: Diagnosis not present

## 2018-11-05 DIAGNOSIS — C50911 Malignant neoplasm of unspecified site of right female breast: Secondary | ICD-10-CM | POA: Diagnosis not present

## 2018-11-05 DIAGNOSIS — R2689 Other abnormalities of gait and mobility: Secondary | ICD-10-CM | POA: Diagnosis not present

## 2018-11-05 DIAGNOSIS — C50912 Malignant neoplasm of unspecified site of left female breast: Secondary | ICD-10-CM | POA: Diagnosis not present

## 2018-11-06 ENCOUNTER — Encounter: Payer: Self-pay | Admitting: Cardiology

## 2018-11-06 NOTE — Progress Notes (Signed)
Remote pacemaker transmission.   

## 2018-11-06 NOTE — Telephone Encounter (Signed)
It is okay to give verbal authorization to requested services. Thanks, BJ 

## 2018-11-06 NOTE — Telephone Encounter (Signed)
Called [name]. No answer. LVM for return call

## 2018-11-11 DIAGNOSIS — C50912 Malignant neoplasm of unspecified site of left female breast: Secondary | ICD-10-CM | POA: Diagnosis not present

## 2018-11-11 DIAGNOSIS — I10 Essential (primary) hypertension: Secondary | ICD-10-CM | POA: Diagnosis not present

## 2018-11-11 DIAGNOSIS — I609 Nontraumatic subarachnoid hemorrhage, unspecified: Secondary | ICD-10-CM | POA: Diagnosis not present

## 2018-11-11 DIAGNOSIS — F22 Delusional disorders: Secondary | ICD-10-CM | POA: Diagnosis not present

## 2018-11-11 DIAGNOSIS — C50911 Malignant neoplasm of unspecified site of right female breast: Secondary | ICD-10-CM | POA: Diagnosis not present

## 2018-11-11 DIAGNOSIS — F29 Unspecified psychosis not due to a substance or known physiological condition: Secondary | ICD-10-CM | POA: Diagnosis not present

## 2018-11-13 ENCOUNTER — Ambulatory Visit: Payer: Self-pay | Admitting: Family Medicine

## 2018-11-13 DIAGNOSIS — I10 Essential (primary) hypertension: Secondary | ICD-10-CM | POA: Diagnosis not present

## 2018-11-13 DIAGNOSIS — I609 Nontraumatic subarachnoid hemorrhage, unspecified: Secondary | ICD-10-CM | POA: Diagnosis not present

## 2018-11-13 DIAGNOSIS — C50911 Malignant neoplasm of unspecified site of right female breast: Secondary | ICD-10-CM | POA: Diagnosis not present

## 2018-11-13 DIAGNOSIS — F22 Delusional disorders: Secondary | ICD-10-CM | POA: Diagnosis not present

## 2018-11-13 DIAGNOSIS — F29 Unspecified psychosis not due to a substance or known physiological condition: Secondary | ICD-10-CM | POA: Diagnosis not present

## 2018-11-13 DIAGNOSIS — C50912 Malignant neoplasm of unspecified site of left female breast: Secondary | ICD-10-CM | POA: Diagnosis not present

## 2018-11-16 DIAGNOSIS — F29 Unspecified psychosis not due to a substance or known physiological condition: Secondary | ICD-10-CM | POA: Diagnosis not present

## 2018-11-16 DIAGNOSIS — I10 Essential (primary) hypertension: Secondary | ICD-10-CM | POA: Diagnosis not present

## 2018-11-16 DIAGNOSIS — C50912 Malignant neoplasm of unspecified site of left female breast: Secondary | ICD-10-CM | POA: Diagnosis not present

## 2018-11-16 DIAGNOSIS — I609 Nontraumatic subarachnoid hemorrhage, unspecified: Secondary | ICD-10-CM | POA: Diagnosis not present

## 2018-11-16 DIAGNOSIS — C50911 Malignant neoplasm of unspecified site of right female breast: Secondary | ICD-10-CM | POA: Diagnosis not present

## 2018-11-16 DIAGNOSIS — F22 Delusional disorders: Secondary | ICD-10-CM | POA: Diagnosis not present

## 2018-11-23 ENCOUNTER — Telehealth: Payer: Self-pay | Admitting: Family Medicine

## 2018-11-23 DIAGNOSIS — F22 Delusional disorders: Secondary | ICD-10-CM | POA: Diagnosis not present

## 2018-11-23 DIAGNOSIS — C50911 Malignant neoplasm of unspecified site of right female breast: Secondary | ICD-10-CM | POA: Diagnosis not present

## 2018-11-23 DIAGNOSIS — C50912 Malignant neoplasm of unspecified site of left female breast: Secondary | ICD-10-CM | POA: Diagnosis not present

## 2018-11-23 DIAGNOSIS — F29 Unspecified psychosis not due to a substance or known physiological condition: Secondary | ICD-10-CM | POA: Diagnosis not present

## 2018-11-23 DIAGNOSIS — I609 Nontraumatic subarachnoid hemorrhage, unspecified: Secondary | ICD-10-CM | POA: Diagnosis not present

## 2018-11-23 DIAGNOSIS — I10 Essential (primary) hypertension: Secondary | ICD-10-CM | POA: Diagnosis not present

## 2018-11-23 NOTE — Telephone Encounter (Signed)
Please advise 

## 2018-11-23 NOTE — Telephone Encounter (Signed)
Copied from [facility] [id number]. Topic: Quick Communication - Home Health Verbal Orders >> Nov 23, 2018  4:22 PM Leward Quan A wrote: Caller/Agency: Constance Haw / [facility] Number: [phone] ok to LM  Requesting OT/PT/Skilled Nursing/Social Work/Speech Therapy: need ok to add nursing evaluation to care requested  Frequency:

## 2018-11-24 NOTE — Telephone Encounter (Signed)
Verbal authorization can be given for requested services. Thanks, BJ 

## 2018-11-24 NOTE — Telephone Encounter (Signed)
Called Almira. LVM for okay for verbal order.

## 2018-11-25 NOTE — Telephone Encounter (Signed)
It seems like she has been on Femara since 2009, length of treatment usually recommended is up to 5 years, occasionally 10 years. Medication can increase the risk of osteoporosis and thrombotic events ambulance home of side effects. It seems like she has already completed 10 years of treatment, so I do not think she needs to continue medication.  Thanks, BJ

## 2018-11-26 DIAGNOSIS — C50912 Malignant neoplasm of unspecified site of left female breast: Secondary | ICD-10-CM | POA: Diagnosis not present

## 2018-11-26 DIAGNOSIS — C50911 Malignant neoplasm of unspecified site of right female breast: Secondary | ICD-10-CM | POA: Diagnosis not present

## 2018-11-26 DIAGNOSIS — I10 Essential (primary) hypertension: Secondary | ICD-10-CM | POA: Diagnosis not present

## 2018-11-26 DIAGNOSIS — F22 Delusional disorders: Secondary | ICD-10-CM | POA: Diagnosis not present

## 2018-11-26 DIAGNOSIS — F29 Unspecified psychosis not due to a substance or known physiological condition: Secondary | ICD-10-CM | POA: Diagnosis not present

## 2018-11-26 DIAGNOSIS — I609 Nontraumatic subarachnoid hemorrhage, unspecified: Secondary | ICD-10-CM | POA: Diagnosis not present

## 2018-11-30 ENCOUNTER — Encounter: Payer: Self-pay | Admitting: Family Medicine

## 2018-11-30 ENCOUNTER — Other Ambulatory Visit: Payer: Self-pay

## 2018-11-30 ENCOUNTER — Ambulatory Visit (INDEPENDENT_AMBULATORY_CARE_PROVIDER_SITE_OTHER): Payer: Medicare Other | Admitting: Family Medicine

## 2018-11-30 VITALS — BP 120/80 | HR 68 | Temp 98.4°F | Resp 16 | Ht 67.0 in | Wt 217.8 lb

## 2018-11-30 DIAGNOSIS — N183 Chronic kidney disease, stage 3 unspecified: Secondary | ICD-10-CM

## 2018-11-30 DIAGNOSIS — I639 Cerebral infarction, unspecified: Secondary | ICD-10-CM | POA: Diagnosis not present

## 2018-11-30 DIAGNOSIS — F22 Delusional disorders: Secondary | ICD-10-CM

## 2018-11-30 DIAGNOSIS — I872 Venous insufficiency (chronic) (peripheral): Secondary | ICD-10-CM | POA: Diagnosis not present

## 2018-11-30 DIAGNOSIS — R197 Diarrhea, unspecified: Secondary | ICD-10-CM | POA: Diagnosis not present

## 2018-11-30 LAB — BASIC METABOLIC PANEL
BUN: 19 mg/dL (ref 6–23)
CO2: 24 mEq/L (ref 19–32)
Calcium: 9.6 mg/dL (ref 8.4–10.5)
Chloride: 106 mEq/L (ref 96–112)
Creatinine, Ser: 1.02 mg/dL (ref 0.40–1.20)
GFR: 51.05 mL/min — ABNORMAL LOW (ref >60.00)
Glucose, Bld: 78 mg/dL (ref 70–99)
Potassium: 3.6 mEq/L (ref 3.5–5.1)
Sodium: 143 mEq/L (ref 135–145)

## 2018-11-30 LAB — MICROALBUMIN / CREATININE URINE RATIO
Creatinine,U: 93 mg/dL
Microalb Creat Ratio: 2.2 mg/g (ref 0.0–30.0)
Microalb, Ur: 2 mg/dL — ABNORMAL HIGH (ref 0.0–1.9)

## 2018-11-30 LAB — VITAMIN D 25 HYDROXY (VIT D DEFICIENCY, FRACTURES): VITD: 43.23 ng/mL (ref 30.00–100.00)

## 2018-11-30 NOTE — Patient Instructions (Addendum)
A few things to remember from today's visit:   CKD (chronic kidney disease), stage III ([place]) - Plan: Basic metabolic panel, VITAMIN D 25 Hydroxy (Vit-D Deficiency, Fractures), Microalbumin / creatinine urine ratio  Venous stasis dermatitis of both lower extremities  Diarrhea, unspecified type  I think you have something called irritable bowel syndrome diarrhea.  Decrease fluid pill to one quarter daily. Do not drink any fluids 4 hours before going to bed.   Stasis Dermatitis Stasis dermatitis is a long-term (chronic) skin condition that happens when veins can no longer pump blood back to the heart (poor circulation). This condition causes a red or brown scaly rash or sores (ulcers) from the pooling of blood (stasis). This condition usually affects the lower legs. It may affect one leg or both legs. Without treatment, severe stasis dermatitis can lead to other skin conditions and infections. What are the causes? This condition is caused by poor circulation. What increases the risk? You are more likely to develop this condition if:  You are not very active.  You stand for long periods of time.  You have veins that have become enlarged and twisted (varicose veins).  You have leg veins that are not strong enough to send blood back to the heart (venous insufficiency).  You have had a blood clot.  You have been pregnant many times.  You have had vein surgery.  You are obese.  You have heart or kidney failure.  You are 1 years of age or older.  You have had injuries to your legs in the past. What are the signs or symptoms? Common early symptoms of this condition include:  Itchiness in one or both of your legs.  Swelling in your ankle or leg. This might get better overnight but be worse again during the day.  Skin that looks thin on your ankle and leg.  Red or brown marks that develop slowly.  Skin that is dry, cracked, or easily irritated.  Red, swollen skin that is  sore or has a burning feeling.  An achy or heavy feeling after you walk or stand for long periods of time.  Pain. Later and more severe symptoms of this condition include:  Skin that looks shiny.  Small, open sores (ulcers). These are often red or purple and leak fluid.  Skin that feels hard.  Severe itching.  A change in the shape or color of your lower legs.  Severe pain.  Difficulty walking. How is this diagnosed? This condition may be diagnosed based on:  Your symptoms and medical history.  A physical exam. You may also have tests, including:  Blood tests.  Imaging tests to check blood flow (Doppler ultrasound).  Allergy tests. You may need to see a health care provider who specializes in skin diseases (dermatologist). How is this treated? This condition may be treated with:  Compression stockings or an elastic wrap to improve circulation.  Medicines, such as: ? Corticosteroid creams and ointments. ? Non-corticosteroid medicines applied to the skin (topical). ? Medicine to reduce swelling in the legs (diuretics). ? Antibiotics. ? Medicine to relieve itching (antihistamines).  A bandage (dressing).  A wrap that contains zinc and gelatin (Unna boot). Follow these instructions at home: Skin care  Moisturize your skin as told by your health care provider. Do not use moisturizers with fragrance. This can irritate your skin.  Apply a cool, wet cloth (cool compress) to the affected areas.  Do not scratch your skin.  Do not rub your skin  dry after a bath or shower. Gently pat your skin dry.  Do not use scented soaps, detergents, or perfumes. Medicines  Take or use over-the-counter and prescription medicines only as told by your health care provider.  If you were prescribed an antibiotic medicine, take or use it as told by your health care provider. Do not stop taking or using the antibiotic even if your condition improves. Activity  Walk as told by your  health care provider. Walking increases blood flow.  Do calf and ankle exercises throughout the day as told by your health care provider. This will help increase blood flow.  Raise (elevate) your legs above the level of your heart when you are sitting or lying down. Lifestyle  Work with your health care provider to lose weight, if needed.  Do not cross your legs when you sit.  Do not stand or sit in one position for long periods of time.  Wear comfortable, loose-fitting clothing. Circulation in your legs will be worse if you wear tight pants, belts, and waistbands.  Do not use any products that contain nicotine or tobacco, such as cigarettes, e-cigarettes, and chewing tobacco. If you need help quitting, ask your health care provider. General instructions  If you were asked to use one of the following to help with your condition, follow instructions from your health care provider on how to: ? Remove and change any dressing. ? Wear compression stockings. These stockings help to prevent blood clots and reduce swelling in your legs. ? Wear the [employer].  Keep all follow-up visits as told by your health care provider. This is important. Contact a health care provider if:  Your condition does not improve with treatment.  Your condition gets worse.  You have signs of infection in the affected area. Watch for: ? Swelling. ? Tenderness. ? Redness. ? Soreness. ? Warmth.  You have a fever. Get help right away if:  You notice red streaks coming from the affected area.  Your bone or joint underneath the affected area becomes painful after the skin has healed.  The affected area turns darker.  You feel a deep pain in your leg or groin.  You are short of breath. Summary  Stasis dermatitis is a long-term (chronic) skin condition that happens when veins can no longer pump blood back to the heart (poor circulation).  Wear compression stockings as told by your health care provider.  These stockings help to prevent blood clots and reduce swelling in your legs.  Follow instructions from your health care provider about activity, medicines, and lifestyle.  Contact a health care provider if you have a fever or have signs of infection in the affected area.  Keep all follow-up visits as told by your health care provider. This is important. This information is not intended to replace advice given to you by your health care provider. Make sure you discuss any questions you have with your health care provider. Document Released: 07/25/2005 Document Revised: 09/15/2017 Document Reviewed: 09/15/2017 Elsevier Patient Education  [place].  Please be sure medication list is accurate. If a new problem present, please set up appointment sooner than planned today.
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
## 2018-12-01 ENCOUNTER — Other Ambulatory Visit: Payer: Self-pay | Admitting: Family Medicine

## 2018-12-01 DIAGNOSIS — F22 Delusional disorders: Secondary | ICD-10-CM | POA: Diagnosis not present

## 2018-12-01 DIAGNOSIS — I609 Nontraumatic subarachnoid hemorrhage, unspecified: Secondary | ICD-10-CM | POA: Diagnosis not present

## 2018-12-01 DIAGNOSIS — I10 Essential (primary) hypertension: Secondary | ICD-10-CM | POA: Diagnosis not present

## 2018-12-01 DIAGNOSIS — F29 Unspecified psychosis not due to a substance or known physiological condition: Secondary | ICD-10-CM | POA: Diagnosis not present

## 2018-12-01 DIAGNOSIS — I872 Venous insufficiency (chronic) (peripheral): Secondary | ICD-10-CM

## 2018-12-01 DIAGNOSIS — C50911 Malignant neoplasm of unspecified site of right female breast: Secondary | ICD-10-CM | POA: Diagnosis not present

## 2018-12-01 DIAGNOSIS — C50912 Malignant neoplasm of unspecified site of left female breast: Secondary | ICD-10-CM | POA: Diagnosis not present

## 2018-12-01 NOTE — Telephone Encounter (Signed)
Pt called in to check the status of this meds, she stated she call it in to pharmacy yesterday .  She is almost out

## 2018-12-04 DIAGNOSIS — F29 Unspecified psychosis not due to a substance or known physiological condition: Secondary | ICD-10-CM | POA: Diagnosis not present

## 2018-12-04 DIAGNOSIS — C50912 Malignant neoplasm of unspecified site of left female breast: Secondary | ICD-10-CM | POA: Diagnosis not present

## 2018-12-04 DIAGNOSIS — C50911 Malignant neoplasm of unspecified site of right female breast: Secondary | ICD-10-CM | POA: Diagnosis not present

## 2018-12-04 DIAGNOSIS — F22 Delusional disorders: Secondary | ICD-10-CM | POA: Diagnosis not present

## 2018-12-04 DIAGNOSIS — I609 Nontraumatic subarachnoid hemorrhage, unspecified: Secondary | ICD-10-CM | POA: Diagnosis not present

## 2018-12-04 DIAGNOSIS — I10 Essential (primary) hypertension: Secondary | ICD-10-CM | POA: Diagnosis not present

## 2018-12-05 DIAGNOSIS — H539 Unspecified visual disturbance: Secondary | ICD-10-CM | POA: Diagnosis not present

## 2018-12-05 DIAGNOSIS — R2689 Other abnormalities of gait and mobility: Secondary | ICD-10-CM | POA: Diagnosis not present

## 2018-12-05 DIAGNOSIS — F29 Unspecified psychosis not due to a substance or known physiological condition: Secondary | ICD-10-CM | POA: Diagnosis not present

## 2018-12-05 DIAGNOSIS — C50911 Malignant neoplasm of unspecified site of right female breast: Secondary | ICD-10-CM | POA: Diagnosis not present

## 2018-12-05 DIAGNOSIS — F22 Delusional disorders: Secondary | ICD-10-CM | POA: Diagnosis not present

## 2018-12-05 DIAGNOSIS — C50912 Malignant neoplasm of unspecified site of left female breast: Secondary | ICD-10-CM | POA: Diagnosis not present

## 2018-12-05 DIAGNOSIS — Z743 Need for continuous supervision: Secondary | ICD-10-CM | POA: Diagnosis not present

## 2018-12-05 DIAGNOSIS — I609 Nontraumatic subarachnoid hemorrhage, unspecified: Secondary | ICD-10-CM | POA: Diagnosis not present

## 2018-12-05 DIAGNOSIS — R51 Headache: Secondary | ICD-10-CM | POA: Diagnosis not present

## 2018-12-05 DIAGNOSIS — I739 Peripheral vascular disease, unspecified: Secondary | ICD-10-CM | POA: Diagnosis not present

## 2018-12-05 DIAGNOSIS — I10 Essential (primary) hypertension: Secondary | ICD-10-CM | POA: Diagnosis not present

## 2018-12-05 DIAGNOSIS — R4182 Altered mental status, unspecified: Secondary | ICD-10-CM | POA: Diagnosis not present

## 2018-12-07 ENCOUNTER — Telehealth: Payer: Self-pay | Admitting: Family Medicine

## 2018-12-07 ENCOUNTER — Ambulatory Visit: Payer: Medicare Other | Admitting: Family Medicine

## 2018-12-07 NOTE — Telephone Encounter (Signed)
Pt was seeing a specialist a few years ago and wanted to speak with Dr. Morrison Old nurse about calling in a RX for Myrbetiq for her kidneys. Pt states she cant sleep well due to always having to get up to go to the bathroom. [name] advise

## 2018-12-07 NOTE — Telephone Encounter (Signed)
See note

## 2018-12-08 ENCOUNTER — Telehealth: Payer: Self-pay | Admitting: Family Medicine

## 2018-12-08 DIAGNOSIS — I609 Nontraumatic subarachnoid hemorrhage, unspecified: Secondary | ICD-10-CM | POA: Diagnosis not present

## 2018-12-08 DIAGNOSIS — F29 Unspecified psychosis not due to a substance or known physiological condition: Secondary | ICD-10-CM | POA: Diagnosis not present

## 2018-12-08 DIAGNOSIS — I10 Essential (primary) hypertension: Secondary | ICD-10-CM | POA: Diagnosis not present

## 2018-12-08 DIAGNOSIS — C50911 Malignant neoplasm of unspecified site of right female breast: Secondary | ICD-10-CM | POA: Diagnosis not present

## 2018-12-08 DIAGNOSIS — C50912 Malignant neoplasm of unspecified site of left female breast: Secondary | ICD-10-CM | POA: Diagnosis not present

## 2018-12-08 DIAGNOSIS — F22 Delusional disorders: Secondary | ICD-10-CM | POA: Diagnosis not present

## 2018-12-08 NOTE — Telephone Encounter (Signed)
Tried calling patient, unable to leave message mailbox full. Will try again later.

## 2018-12-08 NOTE — Telephone Encounter (Signed)
Medication Refill - Medication: Myrbetiq (Pharmacy called and stated they would like a callback or prescription can be escribed over)  Has the patient contacted their pharmacy? Yes (Agent: If no, request that the patient contact the pharmacy for the refill.) (Agent: If yes, when and what did the pharmacy advise?)Contact PCP  Preferred Pharmacy (with phone number or street name):  [id number] [address], [place] [phone] (Phone) [phone] (Fax)     Agent: Please be advised that RX refills may take up to 3 business days. We ask that you follow-up with your pharmacy.

## 2018-12-08 NOTE — Telephone Encounter (Signed)
See request °

## 2018-12-08 NOTE — Telephone Encounter (Signed)
See note

## 2018-12-08 NOTE — Telephone Encounter (Signed)
[name] calling with [facility] to check status of medication. She states that we can call her back if we need anything.    CB#[phone]

## 2018-12-08 NOTE — Telephone Encounter (Signed)
Pt is returning the call Please call back

## 2018-12-08 NOTE — Telephone Encounter (Signed)
Spoke with [name] with patient present. Patient stated that she was prescribed Mybetric years ago by a specialist for her frequent urination and would like to know if she could get medication refilled. Patient advised to schedule appointment with [name] for medication, patient stated that she was just here to see PCP and briefly discussed symptoms and wanted to know if PCP could sent in medication. Patient and [name] informed that message will be sent for review and approval. Please advise.

## 2018-12-10 DIAGNOSIS — F29 Unspecified psychosis not due to a substance or known physiological condition: Secondary | ICD-10-CM | POA: Diagnosis not present

## 2018-12-10 DIAGNOSIS — F22 Delusional disorders: Secondary | ICD-10-CM | POA: Diagnosis not present

## 2018-12-10 DIAGNOSIS — C50911 Malignant neoplasm of unspecified site of right female breast: Secondary | ICD-10-CM | POA: Diagnosis not present

## 2018-12-10 DIAGNOSIS — I609 Nontraumatic subarachnoid hemorrhage, unspecified: Secondary | ICD-10-CM | POA: Diagnosis not present

## 2018-12-10 DIAGNOSIS — C50912 Malignant neoplasm of unspecified site of left female breast: Secondary | ICD-10-CM | POA: Diagnosis not present

## 2018-12-10 DIAGNOSIS — I10 Essential (primary) hypertension: Secondary | ICD-10-CM | POA: Diagnosis not present

## 2018-12-10 NOTE — Telephone Encounter (Signed)
Myrbetriq 25 mg can be sent to her pharmacy, #30/3 We can follow on this problem next OV. Thanks, BJ

## 2018-12-11 ENCOUNTER — Other Ambulatory Visit: Payer: Self-pay | Admitting: *Deleted

## 2018-12-11 MED ORDER — MIRABEGRON ER 25 MG PO TB24: 25.0000 mg | ORAL_TABLET | Freq: Every day | ORAL | 3 refills | Status: DC

## 2018-12-11 NOTE — Telephone Encounter (Signed)
Rx sent to the pharmacy.

## 2018-12-15 ENCOUNTER — Telehealth: Payer: Self-pay | Admitting: Family Medicine

## 2018-12-15 ENCOUNTER — Other Ambulatory Visit: Payer: Self-pay | Admitting: *Deleted

## 2018-12-15 DIAGNOSIS — I609 Nontraumatic subarachnoid hemorrhage, unspecified: Secondary | ICD-10-CM | POA: Diagnosis not present

## 2018-12-15 DIAGNOSIS — F22 Delusional disorders: Secondary | ICD-10-CM | POA: Diagnosis not present

## 2018-12-15 DIAGNOSIS — C50911 Malignant neoplasm of unspecified site of right female breast: Secondary | ICD-10-CM | POA: Diagnosis not present

## 2018-12-15 DIAGNOSIS — I10 Essential (primary) hypertension: Secondary | ICD-10-CM | POA: Diagnosis not present

## 2018-12-15 DIAGNOSIS — C50912 Malignant neoplasm of unspecified site of left female breast: Secondary | ICD-10-CM | POA: Diagnosis not present

## 2018-12-15 DIAGNOSIS — I872 Venous insufficiency (chronic) (peripheral): Secondary | ICD-10-CM

## 2018-12-15 DIAGNOSIS — F29 Unspecified psychosis not due to a substance or known physiological condition: Secondary | ICD-10-CM | POA: Diagnosis not present

## 2018-12-15 MED ORDER — FUROSEMIDE 20 MG PO TABS: 10.0000 mg | ORAL_TABLET | Freq: Every day | ORAL | 1 refills | Status: DC

## 2018-12-15 MED ORDER — MIDODRINE HCL 2.5 MG PO TABS: 2.5000 mg | ORAL_TABLET | Freq: Every day | ORAL | 2 refills | Status: DC

## 2018-12-15 NOTE — Telephone Encounter (Signed)
furosemide (LASIX) 20 MG tablet  letrozole (FEMARA) 2.5 MG tablet  midodrine (PROAMATINE) 2.5 MG tablet  [facility]

## 2018-12-15 NOTE — Telephone Encounter (Signed)
Patient requesting refill of Femara. Haven't been refilled by [name]. Please advise Lasix and Midodrine sent to the pharmacy as requested.

## 2018-12-16 ENCOUNTER — Other Ambulatory Visit: Payer: Self-pay

## 2018-12-16 ENCOUNTER — Encounter: Payer: Self-pay | Admitting: Podiatry

## 2018-12-16 ENCOUNTER — Ambulatory Visit (INDEPENDENT_AMBULATORY_CARE_PROVIDER_SITE_OTHER): Payer: Medicare Other

## 2018-12-16 ENCOUNTER — Ambulatory Visit (INDEPENDENT_AMBULATORY_CARE_PROVIDER_SITE_OTHER): Payer: Medicare Other | Admitting: Podiatry

## 2018-12-16 VITALS — Temp 98.3°F

## 2018-12-16 DIAGNOSIS — L97512 Non-pressure chronic ulcer of other part of right foot with fat layer exposed: Secondary | ICD-10-CM

## 2018-12-16 DIAGNOSIS — L989 Disorder of the skin and subcutaneous tissue, unspecified: Secondary | ICD-10-CM

## 2018-12-16 DIAGNOSIS — I872 Venous insufficiency (chronic) (peripheral): Secondary | ICD-10-CM | POA: Diagnosis not present

## 2018-12-17 ENCOUNTER — Telehealth: Payer: Self-pay | Admitting: *Deleted

## 2018-12-17 DIAGNOSIS — C50911 Malignant neoplasm of unspecified site of right female breast: Secondary | ICD-10-CM | POA: Diagnosis not present

## 2018-12-17 DIAGNOSIS — F29 Unspecified psychosis not due to a substance or known physiological condition: Secondary | ICD-10-CM | POA: Diagnosis not present

## 2018-12-17 DIAGNOSIS — I609 Nontraumatic subarachnoid hemorrhage, unspecified: Secondary | ICD-10-CM | POA: Diagnosis not present

## 2018-12-17 DIAGNOSIS — C50912 Malignant neoplasm of unspecified site of left female breast: Secondary | ICD-10-CM | POA: Diagnosis not present

## 2018-12-17 DIAGNOSIS — F22 Delusional disorders: Secondary | ICD-10-CM | POA: Diagnosis not present

## 2018-12-17 DIAGNOSIS — I10 Essential (primary) hypertension: Secondary | ICD-10-CM | POA: Diagnosis not present

## 2018-12-17 NOTE — Telephone Encounter (Signed)
[facility] states pt is established with them for wound care. Faxed Dr. [name] 12/16/2018 2:49pm orders to [facility].

## 2018-12-17 NOTE — Telephone Encounter (Signed)
[facility] states pt was seen yesterday and pt stated she was to have an antibiotic, but it is not at the pharmacy. [facility] asked that the new orders be faxed to [phone]. Orders faxed this morning to [facility].

## 2018-12-17 NOTE — Telephone Encounter (Signed)
-----   Message from Edrick Kins, DPM sent at 12/16/2018  2:49 PM EDT ----- Regarding: Home health wound dressing orders Please order home health dressing changes - cleanse with normal saline. Dry.  - apply aquacel ag to wound with dry sterile dressing  2x/week x 6 weeks  Dx: ulcer RT foot   Thanks, Dr. [name]

## 2018-12-18 ENCOUNTER — Other Ambulatory Visit: Payer: Self-pay | Admitting: Family Medicine

## 2018-12-18 NOTE — Telephone Encounter (Signed)
She is no longer on Femara. We have a discussion during OV,also she called about this and I recommend discontinuing it,she agreed. I do not recommend resuming medication. Thanks, BJ

## 2018-12-18 NOTE — Telephone Encounter (Signed)
Patient is calling to follow up on her prescription for an antibiotic.   [facility] [address]

## 2018-12-19 NOTE — Progress Notes (Signed)
   Subjective:  85 y.o. female presenting today with a chief complaint of an open wound to the plantar right foot that was just noticed yesterday. Her home health nurse first noticed the wound and reported malodor. Patient has not received any treatment for the ulcer. There are no modifying factors noted. Patient is here for further evaluation and treatment.    Past Medical History:  Diagnosis Date  . Cataract   . Complete heart block (HCC)    s/p PPM implant (MDT) by Dr Blanch Media.  Atrial lead could not be paced at time of the procedure.  She has chronic AV dysociation  . Delusional disorder ([place])   . Exogenous obesity   . Hypercholesterolemia   . Invasive ductal carcinoma of breast ([place]) 03/2007   BILATERAL BREASTS  . Orthostatic hypotension    treated with midodrine by Dr Rollene [name]  . PONV (postoperative nausea and vomiting)   . Thyroid disease   . Venous insufficiency      Objective/Physical Exam General: The patient is alert and oriented x3 in no acute distress.  Dermatology:  Wound #1 noted to the right foot measuring 3.0 x 3.0 x 0.3 cm (LxWxD).   To the noted ulceration(s), there is no eschar. There is a moderate amount of slough, fibrin, and necrotic tissue noted. Granulation tissue and wound base is red. There is a minimal amount of serosanguineous drainage noted. There is no exposed bone muscle-tendon ligament or joint. There is no malodor. Periwound integrity is intact. Skin is warm, dry and supple bilateral lower extremities.  Vascular: Palpable pedal pulses bilaterally. Mild edema noted. Capillary refill within normal limits. Varicosities noted bilateral lower extremities.   Neurological: Epicritic and protective threshold diminished bilaterally.   Musculoskeletal Exam: Range of motion within normal limits to all pedal and ankle joints bilateral. Muscle strength 5/5 in all groups bilateral.   Radiographic Exam:  Normal osseous mineralization. Joint spaces preserved.  No fracture/dislocation/boney destruction.    Assessment: #1 ulceration of the right foot secondary to venous insufficiency #2 Pre-ulcerative callus lesion noted to the right forefoot  #3 varicosities bilateral lower extremities  Plan of Care:  #1 Patient was evaluated. X-Rays reviewed.  #2 medically necessary excisional debridement including subcutaneous tissue was performed using a tissue nipper and a chisel blade. Excisional debridement of all the necrotic nonviable tissue down to healthy bleeding viable tissue was performed with post-debridement measurements same as pre-. #3 the wound was cleansed with normal saline. #4 Orders placed for home health dressing changes.  #5 Excisional debridement of pre-ulcerative keratotic lesion(s) using a chisel blade was performed without incident. Light dressing applied.  #6 Post op shoe dispensed.  #7 Return to clinic in 4 weeks.    Edrick Kins, DPM [facility]  Dr. Edrick Kins, [address]                                        [place], [place] [address]                Office [phone]  Fax [phone]

## 2018-12-21 ENCOUNTER — Telehealth: Payer: Self-pay | Admitting: *Deleted

## 2018-12-21 NOTE — Telephone Encounter (Signed)
[facility] called for orders for wound care.

## 2018-12-21 NOTE — Telephone Encounter (Signed)
Left message with the orders for wound care that was faxed to [facility] on 12/17/2018.

## 2018-12-22 DIAGNOSIS — C50911 Malignant neoplasm of unspecified site of right female breast: Secondary | ICD-10-CM | POA: Diagnosis not present

## 2018-12-22 DIAGNOSIS — F22 Delusional disorders: Secondary | ICD-10-CM | POA: Diagnosis not present

## 2018-12-22 DIAGNOSIS — I609 Nontraumatic subarachnoid hemorrhage, unspecified: Secondary | ICD-10-CM | POA: Diagnosis not present

## 2018-12-22 DIAGNOSIS — I10 Essential (primary) hypertension: Secondary | ICD-10-CM | POA: Diagnosis not present

## 2018-12-22 DIAGNOSIS — C50912 Malignant neoplasm of unspecified site of left female breast: Secondary | ICD-10-CM | POA: Diagnosis not present

## 2018-12-22 DIAGNOSIS — F29 Unspecified psychosis not due to a substance or known physiological condition: Secondary | ICD-10-CM | POA: Diagnosis not present

## 2018-12-22 NOTE — Telephone Encounter (Signed)
Patient informed. 

## 2018-12-24 DIAGNOSIS — C50912 Malignant neoplasm of unspecified site of left female breast: Secondary | ICD-10-CM | POA: Diagnosis not present

## 2018-12-24 DIAGNOSIS — F22 Delusional disorders: Secondary | ICD-10-CM | POA: Diagnosis not present

## 2018-12-24 DIAGNOSIS — I609 Nontraumatic subarachnoid hemorrhage, unspecified: Secondary | ICD-10-CM | POA: Diagnosis not present

## 2018-12-24 DIAGNOSIS — I10 Essential (primary) hypertension: Secondary | ICD-10-CM | POA: Diagnosis not present

## 2018-12-24 DIAGNOSIS — C50911 Malignant neoplasm of unspecified site of right female breast: Secondary | ICD-10-CM | POA: Diagnosis not present

## 2018-12-24 DIAGNOSIS — F29 Unspecified psychosis not due to a substance or known physiological condition: Secondary | ICD-10-CM | POA: Diagnosis not present

## 2018-12-29 DIAGNOSIS — I10 Essential (primary) hypertension: Secondary | ICD-10-CM | POA: Diagnosis not present

## 2018-12-29 DIAGNOSIS — I609 Nontraumatic subarachnoid hemorrhage, unspecified: Secondary | ICD-10-CM | POA: Diagnosis not present

## 2018-12-29 DIAGNOSIS — F22 Delusional disorders: Secondary | ICD-10-CM | POA: Diagnosis not present

## 2018-12-29 DIAGNOSIS — C50911 Malignant neoplasm of unspecified site of right female breast: Secondary | ICD-10-CM | POA: Diagnosis not present

## 2018-12-29 DIAGNOSIS — C50912 Malignant neoplasm of unspecified site of left female breast: Secondary | ICD-10-CM | POA: Diagnosis not present

## 2018-12-29 DIAGNOSIS — F29 Unspecified psychosis not due to a substance or known physiological condition: Secondary | ICD-10-CM | POA: Diagnosis not present

## 2018-12-31 DIAGNOSIS — I609 Nontraumatic subarachnoid hemorrhage, unspecified: Secondary | ICD-10-CM | POA: Diagnosis not present

## 2018-12-31 DIAGNOSIS — C50911 Malignant neoplasm of unspecified site of right female breast: Secondary | ICD-10-CM | POA: Diagnosis not present

## 2018-12-31 DIAGNOSIS — F22 Delusional disorders: Secondary | ICD-10-CM | POA: Diagnosis not present

## 2018-12-31 DIAGNOSIS — C50912 Malignant neoplasm of unspecified site of left female breast: Secondary | ICD-10-CM | POA: Diagnosis not present

## 2018-12-31 DIAGNOSIS — I10 Essential (primary) hypertension: Secondary | ICD-10-CM | POA: Diagnosis not present

## 2018-12-31 DIAGNOSIS — F29 Unspecified psychosis not due to a substance or known physiological condition: Secondary | ICD-10-CM | POA: Diagnosis not present

## 2019-01-04 DIAGNOSIS — H539 Unspecified visual disturbance: Secondary | ICD-10-CM | POA: Diagnosis not present

## 2019-01-04 DIAGNOSIS — R51 Headache: Secondary | ICD-10-CM | POA: Diagnosis not present

## 2019-01-04 DIAGNOSIS — C50912 Malignant neoplasm of unspecified site of left female breast: Secondary | ICD-10-CM | POA: Diagnosis not present

## 2019-01-04 DIAGNOSIS — R4182 Altered mental status, unspecified: Secondary | ICD-10-CM | POA: Diagnosis not present

## 2019-01-04 DIAGNOSIS — F29 Unspecified psychosis not due to a substance or known physiological condition: Secondary | ICD-10-CM | POA: Diagnosis not present

## 2019-01-04 DIAGNOSIS — C50911 Malignant neoplasm of unspecified site of right female breast: Secondary | ICD-10-CM | POA: Diagnosis not present

## 2019-01-04 DIAGNOSIS — R2689 Other abnormalities of gait and mobility: Secondary | ICD-10-CM | POA: Diagnosis not present

## 2019-01-04 DIAGNOSIS — I739 Peripheral vascular disease, unspecified: Secondary | ICD-10-CM | POA: Diagnosis not present

## 2019-01-04 DIAGNOSIS — F22 Delusional disorders: Secondary | ICD-10-CM | POA: Diagnosis not present

## 2019-01-04 DIAGNOSIS — I609 Nontraumatic subarachnoid hemorrhage, unspecified: Secondary | ICD-10-CM | POA: Diagnosis not present

## 2019-01-04 DIAGNOSIS — I10 Essential (primary) hypertension: Secondary | ICD-10-CM | POA: Diagnosis not present

## 2019-01-04 DIAGNOSIS — Z743 Need for continuous supervision: Secondary | ICD-10-CM | POA: Diagnosis not present

## 2019-01-05 DIAGNOSIS — C50912 Malignant neoplasm of unspecified site of left female breast: Secondary | ICD-10-CM | POA: Diagnosis not present

## 2019-01-05 DIAGNOSIS — C50911 Malignant neoplasm of unspecified site of right female breast: Secondary | ICD-10-CM | POA: Diagnosis not present

## 2019-01-05 DIAGNOSIS — I10 Essential (primary) hypertension: Secondary | ICD-10-CM | POA: Diagnosis not present

## 2019-01-05 DIAGNOSIS — F29 Unspecified psychosis not due to a substance or known physiological condition: Secondary | ICD-10-CM | POA: Diagnosis not present

## 2019-01-05 DIAGNOSIS — F22 Delusional disorders: Secondary | ICD-10-CM | POA: Diagnosis not present

## 2019-01-05 DIAGNOSIS — I609 Nontraumatic subarachnoid hemorrhage, unspecified: Secondary | ICD-10-CM | POA: Diagnosis not present

## 2019-01-07 DIAGNOSIS — C50912 Malignant neoplasm of unspecified site of left female breast: Secondary | ICD-10-CM | POA: Diagnosis not present

## 2019-01-07 DIAGNOSIS — C50911 Malignant neoplasm of unspecified site of right female breast: Secondary | ICD-10-CM | POA: Diagnosis not present

## 2019-01-07 DIAGNOSIS — F29 Unspecified psychosis not due to a substance or known physiological condition: Secondary | ICD-10-CM | POA: Diagnosis not present

## 2019-01-07 DIAGNOSIS — I10 Essential (primary) hypertension: Secondary | ICD-10-CM | POA: Diagnosis not present

## 2019-01-07 DIAGNOSIS — I609 Nontraumatic subarachnoid hemorrhage, unspecified: Secondary | ICD-10-CM | POA: Diagnosis not present

## 2019-01-07 DIAGNOSIS — F22 Delusional disorders: Secondary | ICD-10-CM | POA: Diagnosis not present

## 2019-01-09 DIAGNOSIS — Z23 Encounter for immunization: Secondary | ICD-10-CM | POA: Diagnosis not present

## 2019-01-11 ENCOUNTER — Other Ambulatory Visit: Payer: Self-pay | Admitting: Family Medicine

## 2019-01-12 DIAGNOSIS — I10 Essential (primary) hypertension: Secondary | ICD-10-CM | POA: Diagnosis not present

## 2019-01-12 DIAGNOSIS — F22 Delusional disorders: Secondary | ICD-10-CM | POA: Diagnosis not present

## 2019-01-12 DIAGNOSIS — C50911 Malignant neoplasm of unspecified site of right female breast: Secondary | ICD-10-CM | POA: Diagnosis not present

## 2019-01-12 DIAGNOSIS — C50912 Malignant neoplasm of unspecified site of left female breast: Secondary | ICD-10-CM | POA: Diagnosis not present

## 2019-01-12 DIAGNOSIS — I609 Nontraumatic subarachnoid hemorrhage, unspecified: Secondary | ICD-10-CM | POA: Diagnosis not present

## 2019-01-12 DIAGNOSIS — F29 Unspecified psychosis not due to a substance or known physiological condition: Secondary | ICD-10-CM | POA: Diagnosis not present

## 2019-01-13 ENCOUNTER — Other Ambulatory Visit: Payer: Self-pay

## 2019-01-13 ENCOUNTER — Ambulatory Visit (INDEPENDENT_AMBULATORY_CARE_PROVIDER_SITE_OTHER): Payer: Medicare Other | Admitting: Podiatry

## 2019-01-13 ENCOUNTER — Encounter: Payer: Self-pay | Admitting: Podiatry

## 2019-01-13 DIAGNOSIS — L989 Disorder of the skin and subcutaneous tissue, unspecified: Secondary | ICD-10-CM | POA: Diagnosis not present

## 2019-01-13 DIAGNOSIS — I872 Venous insufficiency (chronic) (peripheral): Secondary | ICD-10-CM

## 2019-01-13 DIAGNOSIS — L97512 Non-pressure chronic ulcer of other part of right foot with fat layer exposed: Secondary | ICD-10-CM | POA: Diagnosis not present

## 2019-01-15 DIAGNOSIS — I609 Nontraumatic subarachnoid hemorrhage, unspecified: Secondary | ICD-10-CM | POA: Diagnosis not present

## 2019-01-15 DIAGNOSIS — C50911 Malignant neoplasm of unspecified site of right female breast: Secondary | ICD-10-CM | POA: Diagnosis not present

## 2019-01-15 DIAGNOSIS — C50912 Malignant neoplasm of unspecified site of left female breast: Secondary | ICD-10-CM | POA: Diagnosis not present

## 2019-01-15 DIAGNOSIS — I10 Essential (primary) hypertension: Secondary | ICD-10-CM | POA: Diagnosis not present

## 2019-01-15 DIAGNOSIS — F22 Delusional disorders: Secondary | ICD-10-CM | POA: Diagnosis not present

## 2019-01-15 DIAGNOSIS — F29 Unspecified psychosis not due to a substance or known physiological condition: Secondary | ICD-10-CM | POA: Diagnosis not present

## 2019-01-16 ENCOUNTER — Other Ambulatory Visit: Payer: Self-pay | Admitting: Family Medicine

## 2019-01-16 DIAGNOSIS — E039 Hypothyroidism, unspecified: Secondary | ICD-10-CM

## 2019-01-16 NOTE — Progress Notes (Signed)
   Subjective:  85 y.o. female presenting today for follow up evaluation of an ulceration of the right foot. She states she is doing well. She denies any significant pain or modifying factors. She has been having her dressings changed by home health. Patient is here for further evaluation and treatment.    Past Medical History:  Diagnosis Date  . Cataract   . Complete heart block (HCC)    s/p PPM implant (MDT) by Dr Blanch Media.  Atrial lead could not be paced at time of the procedure.  She has chronic AV dysociation  . Delusional disorder ([place])   . Exogenous obesity   . Hypercholesterolemia   . Invasive ductal carcinoma of breast ([place]) 03/2007   BILATERAL BREASTS  . Orthostatic hypotension    treated with midodrine by Dr Rollene [name]  . PONV (postoperative nausea and vomiting)   . Thyroid disease   . Venous insufficiency      Objective/Physical Exam General: The patient is alert and oriented x3 in no acute distress.  Dermatology:  Wound #1 noted to the right foot measuring 2.5 x 2.5 x 0.2 cm (LxWxD).   To the noted ulceration(s), there is no eschar. There is a moderate amount of slough, fibrin, and necrotic tissue noted. Granulation tissue and wound base is red. There is a minimal amount of serosanguineous drainage noted. There is no exposed bone muscle-tendon ligament or joint. There is no malodor. Periwound integrity is intact. Hyperkeratotic lesion(s) present on the right forefoot. Pain on palpation with a central nucleated core noted. Skin is warm, dry and supple bilateral lower extremities.  Vascular: Palpable pedal pulses bilaterally. Mild edema noted. Capillary refill within normal limits. Varicosities noted bilateral lower extremities.   Neurological: Epicritic and protective threshold diminished bilaterally.   Musculoskeletal Exam: Range of motion within normal limits to all pedal and ankle joints bilateral. Muscle strength 5/5 in all groups bilateral.   Assessment: #1  ulceration of the right foot secondary to venous insufficiency #2 Pre-ulcerative callus lesion noted to the right forefoot  #3 varicosities bilateral lower extremities  Plan of Care:  #1 Patient was evaluated.  #2 medically necessary excisional debridement including subcutaneous tissue was performed using a tissue nipper and a chisel blade. Excisional debridement of all the necrotic nonviable tissue down to healthy bleeding viable tissue was performed with post-debridement measurements same as pre-. #3 the wound was cleansed with normal saline. #4 Excisional debridement of keratotic lesion(s) using a chisel blade was performed without incident. Light dressing applied.  #5 Continue home health dressing changes.  #6 Return to clinic in 4 weeks for follow up and routine nail care.     Edrick Kins, DPM [facility]  Dr. Edrick Kins, [address]                                        [place], [place] [address]                Office [phone]  Fax [phone]

## 2019-01-19 ENCOUNTER — Telehealth: Payer: Self-pay | Admitting: *Deleted

## 2019-01-19 DIAGNOSIS — F29 Unspecified psychosis not due to a substance or known physiological condition: Secondary | ICD-10-CM | POA: Diagnosis not present

## 2019-01-19 DIAGNOSIS — C50912 Malignant neoplasm of unspecified site of left female breast: Secondary | ICD-10-CM | POA: Diagnosis not present

## 2019-01-19 DIAGNOSIS — I609 Nontraumatic subarachnoid hemorrhage, unspecified: Secondary | ICD-10-CM | POA: Diagnosis not present

## 2019-01-19 DIAGNOSIS — F22 Delusional disorders: Secondary | ICD-10-CM | POA: Diagnosis not present

## 2019-01-19 DIAGNOSIS — I10 Essential (primary) hypertension: Secondary | ICD-10-CM | POA: Diagnosis not present

## 2019-01-19 DIAGNOSIS — C50911 Malignant neoplasm of unspecified site of right female breast: Secondary | ICD-10-CM | POA: Diagnosis not present

## 2019-01-19 NOTE — Telephone Encounter (Signed)
FYI sent to [name] for review. Copied from [place] [phone]. Topic: General - Other >> Jan 19, 2019  3:44 PM Yvette Rack wrote: Reason for CRM: [name] with [facility] stated they will continue to go out twice a week to see the patient. Cb# [phone]

## 2019-01-22 DIAGNOSIS — I609 Nontraumatic subarachnoid hemorrhage, unspecified: Secondary | ICD-10-CM | POA: Diagnosis not present

## 2019-01-22 DIAGNOSIS — I10 Essential (primary) hypertension: Secondary | ICD-10-CM | POA: Diagnosis not present

## 2019-01-22 DIAGNOSIS — F29 Unspecified psychosis not due to a substance or known physiological condition: Secondary | ICD-10-CM | POA: Diagnosis not present

## 2019-01-22 DIAGNOSIS — C50912 Malignant neoplasm of unspecified site of left female breast: Secondary | ICD-10-CM | POA: Diagnosis not present

## 2019-01-22 DIAGNOSIS — F22 Delusional disorders: Secondary | ICD-10-CM | POA: Diagnosis not present

## 2019-01-22 DIAGNOSIS — C50911 Malignant neoplasm of unspecified site of right female breast: Secondary | ICD-10-CM | POA: Diagnosis not present

## 2019-01-25 ENCOUNTER — Ambulatory Visit: Payer: Medicare Other | Admitting: Podiatry

## 2019-01-25 ENCOUNTER — Ambulatory Visit (INDEPENDENT_AMBULATORY_CARE_PROVIDER_SITE_OTHER): Payer: Medicare Other | Admitting: *Deleted

## 2019-01-25 DIAGNOSIS — F22 Delusional disorders: Secondary | ICD-10-CM | POA: Diagnosis not present

## 2019-01-25 DIAGNOSIS — I10 Essential (primary) hypertension: Secondary | ICD-10-CM | POA: Diagnosis not present

## 2019-01-25 DIAGNOSIS — C50911 Malignant neoplasm of unspecified site of right female breast: Secondary | ICD-10-CM | POA: Diagnosis not present

## 2019-01-25 DIAGNOSIS — I609 Nontraumatic subarachnoid hemorrhage, unspecified: Secondary | ICD-10-CM | POA: Diagnosis not present

## 2019-01-25 DIAGNOSIS — C50912 Malignant neoplasm of unspecified site of left female breast: Secondary | ICD-10-CM | POA: Diagnosis not present

## 2019-01-25 DIAGNOSIS — I442 Atrioventricular block, complete: Secondary | ICD-10-CM | POA: Diagnosis not present

## 2019-01-25 DIAGNOSIS — F29 Unspecified psychosis not due to a substance or known physiological condition: Secondary | ICD-10-CM | POA: Diagnosis not present

## 2019-01-25 LAB — CUP PACEART REMOTE DEVICE CHECK
Battery Impedance: 1345 Ohm
Battery Remaining Longevity: 44 mo
Battery Voltage: 2.76 V
Brady Statistic RV Percent Paced: 100 %
Date Time Interrogation Session: 20200928083913
Implantable Lead Implant Date: 20141208
Implantable Lead Location: 753860
Implantable Lead Model: 4076
Implantable Pulse Generator Implant Date: 20161027
Lead Channel Impedance Value: 0 Ohm
Lead Channel Impedance Value: 373 Ohm
Lead Channel Pacing Threshold Amplitude: 1.25 V
Lead Channel Pacing Threshold Pulse Width: 0.4 ms
Lead Channel Setting Pacing Amplitude: 2.5 V
Lead Channel Setting Pacing Pulse Width: 0.4 ms
Lead Channel Setting Sensing Sensitivity: 2 mV

## 2019-01-28 DIAGNOSIS — I10 Essential (primary) hypertension: Secondary | ICD-10-CM | POA: Diagnosis not present

## 2019-01-28 DIAGNOSIS — C50911 Malignant neoplasm of unspecified site of right female breast: Secondary | ICD-10-CM | POA: Diagnosis not present

## 2019-01-28 DIAGNOSIS — F22 Delusional disorders: Secondary | ICD-10-CM | POA: Diagnosis not present

## 2019-01-28 DIAGNOSIS — I609 Nontraumatic subarachnoid hemorrhage, unspecified: Secondary | ICD-10-CM | POA: Diagnosis not present

## 2019-01-28 DIAGNOSIS — F29 Unspecified psychosis not due to a substance or known physiological condition: Secondary | ICD-10-CM | POA: Diagnosis not present

## 2019-01-28 DIAGNOSIS — C50912 Malignant neoplasm of unspecified site of left female breast: Secondary | ICD-10-CM | POA: Diagnosis not present

## 2019-02-01 ENCOUNTER — Telehealth: Payer: Self-pay | Admitting: Family Medicine

## 2019-02-01 DIAGNOSIS — I10 Essential (primary) hypertension: Secondary | ICD-10-CM | POA: Diagnosis not present

## 2019-02-01 DIAGNOSIS — F22 Delusional disorders: Secondary | ICD-10-CM | POA: Diagnosis not present

## 2019-02-01 DIAGNOSIS — I609 Nontraumatic subarachnoid hemorrhage, unspecified: Secondary | ICD-10-CM | POA: Diagnosis not present

## 2019-02-01 DIAGNOSIS — F29 Unspecified psychosis not due to a substance or known physiological condition: Secondary | ICD-10-CM | POA: Diagnosis not present

## 2019-02-01 DIAGNOSIS — C50911 Malignant neoplasm of unspecified site of right female breast: Secondary | ICD-10-CM | POA: Diagnosis not present

## 2019-02-01 DIAGNOSIS — C50912 Malignant neoplasm of unspecified site of left female breast: Secondary | ICD-10-CM | POA: Diagnosis not present

## 2019-02-01 NOTE — Telephone Encounter (Signed)
[name], from [facility], called stating she is starting recert to continue wound care for 2 months. Please advise. [phone]

## 2019-02-01 NOTE — Telephone Encounter (Signed)
Spoke with [name] from [facility] and she stated that patient still has wound and they are going to keep doing treatment. They will re certify for another 60 days.  FYI sent to [name].

## 2019-02-03 DIAGNOSIS — Z853 Personal history of malignant neoplasm of breast: Secondary | ICD-10-CM | POA: Diagnosis not present

## 2019-02-03 DIAGNOSIS — I609 Nontraumatic subarachnoid hemorrhage, unspecified: Secondary | ICD-10-CM | POA: Diagnosis not present

## 2019-02-03 DIAGNOSIS — Z743 Need for continuous supervision: Secondary | ICD-10-CM | POA: Diagnosis not present

## 2019-02-03 DIAGNOSIS — F22 Delusional disorders: Secondary | ICD-10-CM | POA: Diagnosis not present

## 2019-02-03 DIAGNOSIS — I10 Essential (primary) hypertension: Secondary | ICD-10-CM | POA: Diagnosis not present

## 2019-02-03 DIAGNOSIS — I739 Peripheral vascular disease, unspecified: Secondary | ICD-10-CM | POA: Diagnosis not present

## 2019-02-03 DIAGNOSIS — Z950 Presence of cardiac pacemaker: Secondary | ICD-10-CM | POA: Diagnosis not present

## 2019-02-03 DIAGNOSIS — L89893 Pressure ulcer of other site, stage 3: Secondary | ICD-10-CM | POA: Diagnosis not present

## 2019-02-03 NOTE — Progress Notes (Signed)
Remote pacemaker transmission.   

## 2019-02-04 DIAGNOSIS — L89893 Pressure ulcer of other site, stage 3: Secondary | ICD-10-CM | POA: Diagnosis not present

## 2019-02-04 DIAGNOSIS — I609 Nontraumatic subarachnoid hemorrhage, unspecified: Secondary | ICD-10-CM | POA: Diagnosis not present

## 2019-02-04 DIAGNOSIS — F22 Delusional disorders: Secondary | ICD-10-CM | POA: Diagnosis not present

## 2019-02-04 DIAGNOSIS — Z743 Need for continuous supervision: Secondary | ICD-10-CM | POA: Diagnosis not present

## 2019-02-04 DIAGNOSIS — I739 Peripheral vascular disease, unspecified: Secondary | ICD-10-CM | POA: Diagnosis not present

## 2019-02-04 DIAGNOSIS — I10 Essential (primary) hypertension: Secondary | ICD-10-CM | POA: Diagnosis not present

## 2019-02-08 DIAGNOSIS — F22 Delusional disorders: Secondary | ICD-10-CM | POA: Diagnosis not present

## 2019-02-08 DIAGNOSIS — I609 Nontraumatic subarachnoid hemorrhage, unspecified: Secondary | ICD-10-CM | POA: Diagnosis not present

## 2019-02-08 DIAGNOSIS — L89893 Pressure ulcer of other site, stage 3: Secondary | ICD-10-CM | POA: Diagnosis not present

## 2019-02-08 DIAGNOSIS — I739 Peripheral vascular disease, unspecified: Secondary | ICD-10-CM | POA: Diagnosis not present

## 2019-02-08 DIAGNOSIS — Z743 Need for continuous supervision: Secondary | ICD-10-CM | POA: Diagnosis not present

## 2019-02-08 DIAGNOSIS — I10 Essential (primary) hypertension: Secondary | ICD-10-CM | POA: Diagnosis not present

## 2019-02-10 ENCOUNTER — Other Ambulatory Visit: Payer: Self-pay

## 2019-02-10 ENCOUNTER — Ambulatory Visit (INDEPENDENT_AMBULATORY_CARE_PROVIDER_SITE_OTHER): Payer: Medicare Other | Admitting: Podiatry

## 2019-02-10 VITALS — Temp 97.3°F

## 2019-02-10 DIAGNOSIS — I872 Venous insufficiency (chronic) (peripheral): Secondary | ICD-10-CM

## 2019-02-10 DIAGNOSIS — L989 Disorder of the skin and subcutaneous tissue, unspecified: Secondary | ICD-10-CM | POA: Diagnosis not present

## 2019-02-10 DIAGNOSIS — L97512 Non-pressure chronic ulcer of other part of right foot with fat layer exposed: Secondary | ICD-10-CM | POA: Diagnosis not present

## 2019-02-12 DIAGNOSIS — I739 Peripheral vascular disease, unspecified: Secondary | ICD-10-CM | POA: Diagnosis not present

## 2019-02-12 DIAGNOSIS — I609 Nontraumatic subarachnoid hemorrhage, unspecified: Secondary | ICD-10-CM | POA: Diagnosis not present

## 2019-02-12 DIAGNOSIS — Z743 Need for continuous supervision: Secondary | ICD-10-CM | POA: Diagnosis not present

## 2019-02-12 DIAGNOSIS — L89893 Pressure ulcer of other site, stage 3: Secondary | ICD-10-CM | POA: Diagnosis not present

## 2019-02-12 DIAGNOSIS — F22 Delusional disorders: Secondary | ICD-10-CM | POA: Diagnosis not present

## 2019-02-12 DIAGNOSIS — I10 Essential (primary) hypertension: Secondary | ICD-10-CM | POA: Diagnosis not present

## 2019-02-14 NOTE — Progress Notes (Signed)
   Subjective:  85 y.o. female presenting today for follow up evaluation of an ulceration of the right foot. She states she is doing well. She reports some mild drainage. She has been having the dressings changed regularly as directed. There are no worsening factors noted. Patient is here for further evaluation and treatment.    Past Medical History:  Diagnosis Date  . Cataract   . Complete heart block (HCC)    s/p PPM implant (MDT) by Dr Blanch Media.  Atrial lead could not be paced at time of the procedure.  She has chronic AV dysociation  . Delusional disorder ([place])   . Exogenous obesity   . Hypercholesterolemia   . Invasive ductal carcinoma of breast ([place]) 03/2007   BILATERAL BREASTS  . Orthostatic hypotension    treated with midodrine by Dr Rollene [name]  . PONV (postoperative nausea and vomiting)   . Thyroid disease   . Venous insufficiency      Objective/Physical Exam General: The patient is alert and oriented x3 in no acute distress.  Dermatology:  Wound #1 noted to the right foot measuring 1.0 x 1.0 x 0.2 cm (LxWxD).   Wound #2 noted to the right 4th toe measuring 0.2 x 0.2 x 0.1 cm.   To the noted ulceration(s), there is no eschar. There is a moderate amount of slough, fibrin, and necrotic tissue noted. Granulation tissue and wound base is red. There is a minimal amount of serosanguineous drainage noted. There is no exposed bone muscle-tendon ligament or joint. There is no malodor. Periwound integrity is intact. Hyperkeratotic lesion(s) present on the right forefoot. Pain on palpation with a central nucleated core noted. Skin is warm, dry and supple bilateral lower extremities.  Vascular: Palpable pedal pulses bilaterally. Mild edema noted. Capillary refill within normal limits. Varicosities noted bilateral lower extremities.   Neurological: Epicritic and protective threshold diminished bilaterally.   Musculoskeletal Exam: Range of motion within normal limits to all pedal and  ankle joints bilateral. Muscle strength 5/5 in all groups bilateral.   Assessment: #1 ulceration of the right foot secondary to venous insufficiency #2 Pre-ulcerative callus lesion noted to the right forefoot  #3 varicosities bilateral lower extremities #4 ulceration of the right 4th toe secondary to venous insufficiency   Plan of Care:  #1 Patient was evaluated.  #2 medically necessary excisional debridement including subcutaneous tissue was performed using a tissue nipper and a chisel blade. Excisional debridement of all the necrotic nonviable tissue down to healthy bleeding viable tissue was performed with post-debridement measurements same as pre-. #3 the wound was cleansed with normal saline. #4 Excisional debridement of keratotic lesion(s) using a chisel blade was performed without incident. Light dressing applied.  #5 Continue home health dressing changes every other day.  #6 Return to clinic in 4 weeks.     Edrick Kins, DPM [facility]  Dr. Edrick Kins, [address]                                        [place], [place] [address]                Office [phone]  Fax [phone]

## 2019-02-15 DIAGNOSIS — F22 Delusional disorders: Secondary | ICD-10-CM | POA: Diagnosis not present

## 2019-02-15 DIAGNOSIS — Z743 Need for continuous supervision: Secondary | ICD-10-CM | POA: Diagnosis not present

## 2019-02-15 DIAGNOSIS — I609 Nontraumatic subarachnoid hemorrhage, unspecified: Secondary | ICD-10-CM | POA: Diagnosis not present

## 2019-02-15 DIAGNOSIS — I739 Peripheral vascular disease, unspecified: Secondary | ICD-10-CM | POA: Diagnosis not present

## 2019-02-15 DIAGNOSIS — I10 Essential (primary) hypertension: Secondary | ICD-10-CM | POA: Diagnosis not present

## 2019-02-15 DIAGNOSIS — L89893 Pressure ulcer of other site, stage 3: Secondary | ICD-10-CM | POA: Diagnosis not present

## 2019-02-18 DIAGNOSIS — I609 Nontraumatic subarachnoid hemorrhage, unspecified: Secondary | ICD-10-CM | POA: Diagnosis not present

## 2019-02-18 DIAGNOSIS — L89893 Pressure ulcer of other site, stage 3: Secondary | ICD-10-CM | POA: Diagnosis not present

## 2019-02-18 DIAGNOSIS — I10 Essential (primary) hypertension: Secondary | ICD-10-CM | POA: Diagnosis not present

## 2019-02-18 DIAGNOSIS — F22 Delusional disorders: Secondary | ICD-10-CM | POA: Diagnosis not present

## 2019-02-18 DIAGNOSIS — Z743 Need for continuous supervision: Secondary | ICD-10-CM | POA: Diagnosis not present

## 2019-02-18 DIAGNOSIS — I739 Peripheral vascular disease, unspecified: Secondary | ICD-10-CM | POA: Diagnosis not present

## 2019-02-22 DIAGNOSIS — F22 Delusional disorders: Secondary | ICD-10-CM | POA: Diagnosis not present

## 2019-02-22 DIAGNOSIS — I609 Nontraumatic subarachnoid hemorrhage, unspecified: Secondary | ICD-10-CM | POA: Diagnosis not present

## 2019-02-22 DIAGNOSIS — I739 Peripheral vascular disease, unspecified: Secondary | ICD-10-CM | POA: Diagnosis not present

## 2019-02-22 DIAGNOSIS — L89893 Pressure ulcer of other site, stage 3: Secondary | ICD-10-CM | POA: Diagnosis not present

## 2019-02-22 DIAGNOSIS — Z743 Need for continuous supervision: Secondary | ICD-10-CM | POA: Diagnosis not present

## 2019-02-22 DIAGNOSIS — I10 Essential (primary) hypertension: Secondary | ICD-10-CM | POA: Diagnosis not present

## 2019-02-23 DIAGNOSIS — Z961 Presence of intraocular lens: Secondary | ICD-10-CM | POA: Diagnosis not present

## 2019-02-23 DIAGNOSIS — H43811 Vitreous degeneration, right eye: Secondary | ICD-10-CM | POA: Diagnosis not present

## 2019-02-23 DIAGNOSIS — H401132 Primary open-angle glaucoma, bilateral, moderate stage: Secondary | ICD-10-CM | POA: Diagnosis not present

## 2019-02-25 DIAGNOSIS — Z743 Need for continuous supervision: Secondary | ICD-10-CM | POA: Diagnosis not present

## 2019-02-25 DIAGNOSIS — I609 Nontraumatic subarachnoid hemorrhage, unspecified: Secondary | ICD-10-CM | POA: Diagnosis not present

## 2019-02-25 DIAGNOSIS — I10 Essential (primary) hypertension: Secondary | ICD-10-CM | POA: Diagnosis not present

## 2019-02-25 DIAGNOSIS — F22 Delusional disorders: Secondary | ICD-10-CM | POA: Diagnosis not present

## 2019-02-25 DIAGNOSIS — L89893 Pressure ulcer of other site, stage 3: Secondary | ICD-10-CM | POA: Diagnosis not present

## 2019-02-25 DIAGNOSIS — I739 Peripheral vascular disease, unspecified: Secondary | ICD-10-CM | POA: Diagnosis not present

## 2019-03-02 ENCOUNTER — Telehealth: Payer: Self-pay | Admitting: Family Medicine

## 2019-03-02 NOTE — Telephone Encounter (Signed)
Olivia Mackie RN with [facility]. Calling in to request a order for a UA for pt, pt is experiencing some urination frequency. Please assist    [phone]

## 2019-03-02 NOTE — Telephone Encounter (Signed)
It is fine to authorize UA order as requested. Thanks, BJ

## 2019-03-03 ENCOUNTER — Telehealth: Payer: Self-pay | Admitting: Family Medicine

## 2019-03-03 DIAGNOSIS — I739 Peripheral vascular disease, unspecified: Secondary | ICD-10-CM | POA: Diagnosis not present

## 2019-03-03 DIAGNOSIS — I609 Nontraumatic subarachnoid hemorrhage, unspecified: Secondary | ICD-10-CM | POA: Diagnosis not present

## 2019-03-03 DIAGNOSIS — L89893 Pressure ulcer of other site, stage 3: Secondary | ICD-10-CM | POA: Diagnosis not present

## 2019-03-03 DIAGNOSIS — F22 Delusional disorders: Secondary | ICD-10-CM | POA: Diagnosis not present

## 2019-03-03 DIAGNOSIS — I10 Essential (primary) hypertension: Secondary | ICD-10-CM | POA: Diagnosis not present

## 2019-03-03 DIAGNOSIS — Z743 Need for continuous supervision: Secondary | ICD-10-CM | POA: Diagnosis not present

## 2019-03-03 NOTE — Telephone Encounter (Signed)
[name] (nurse) called from [facility] regarding patients  Right lower leg is reddish and swollen.   [name] call back [phone]

## 2019-03-05 DIAGNOSIS — Z950 Presence of cardiac pacemaker: Secondary | ICD-10-CM | POA: Diagnosis not present

## 2019-03-05 DIAGNOSIS — I609 Nontraumatic subarachnoid hemorrhage, unspecified: Secondary | ICD-10-CM | POA: Diagnosis not present

## 2019-03-05 DIAGNOSIS — Z743 Need for continuous supervision: Secondary | ICD-10-CM | POA: Diagnosis not present

## 2019-03-05 DIAGNOSIS — I10 Essential (primary) hypertension: Secondary | ICD-10-CM | POA: Diagnosis not present

## 2019-03-05 DIAGNOSIS — L89893 Pressure ulcer of other site, stage 3: Secondary | ICD-10-CM | POA: Diagnosis not present

## 2019-03-05 DIAGNOSIS — Z853 Personal history of malignant neoplasm of breast: Secondary | ICD-10-CM | POA: Diagnosis not present

## 2019-03-05 DIAGNOSIS — I739 Peripheral vascular disease, unspecified: Secondary | ICD-10-CM | POA: Diagnosis not present

## 2019-03-05 DIAGNOSIS — F22 Delusional disorders: Secondary | ICD-10-CM | POA: Diagnosis not present

## 2019-03-05 NOTE — Telephone Encounter (Signed)
Attempted to contact [name]. No answer. LVM for her to return office call. Also attempted to contact patient no answer. Unable to LVM since the mailbox is full.

## 2019-03-05 NOTE — Telephone Encounter (Signed)
Spoke with [name]. Informed her of Dr. Doug Sou message.She informed me to call [name] listed. Called him and no answer LVM in regards to [name].  Communicated this with [name]

## 2019-03-05 NOTE — Telephone Encounter (Signed)
[name] returned [name]'s call. [name] requests call back.

## 2019-03-05 NOTE — Telephone Encounter (Signed)
She has hx of stasis dermatitis but if this RLE edema and redness is new she needs to be evaluated in acute care facility. Thanks, BJ

## 2019-03-08 ENCOUNTER — Telehealth: Payer: Self-pay | Admitting: Podiatry

## 2019-03-08 DIAGNOSIS — F22 Delusional disorders: Secondary | ICD-10-CM | POA: Diagnosis not present

## 2019-03-08 DIAGNOSIS — I10 Essential (primary) hypertension: Secondary | ICD-10-CM | POA: Diagnosis not present

## 2019-03-08 DIAGNOSIS — I739 Peripheral vascular disease, unspecified: Secondary | ICD-10-CM | POA: Diagnosis not present

## 2019-03-08 DIAGNOSIS — Z743 Need for continuous supervision: Secondary | ICD-10-CM | POA: Diagnosis not present

## 2019-03-08 DIAGNOSIS — I609 Nontraumatic subarachnoid hemorrhage, unspecified: Secondary | ICD-10-CM | POA: Diagnosis not present

## 2019-03-08 DIAGNOSIS — L89893 Pressure ulcer of other site, stage 3: Secondary | ICD-10-CM | POA: Diagnosis not present

## 2019-03-08 NOTE — Telephone Encounter (Signed)
Olivia Mackie with [facility] called to let us know that Hajra's leg is white and weeping. She wanted to let the doctors know to look at it when she comes in for her appt this Thursday. Olivia Mackie would like you to give her a call back at [phone].

## 2019-03-09 NOTE — Telephone Encounter (Signed)
I spoke with [facility] - Olivia Mackie states the leg is normal warmth, but has weeping and maceration to the shin, and she covered with gauze 4x4. I told Olivia Mackie I would inform Dr. [name] and call if her gave instructions prior to pt's appt tomorrow.

## 2019-03-10 ENCOUNTER — Other Ambulatory Visit: Payer: Self-pay

## 2019-03-10 ENCOUNTER — Ambulatory Visit (INDEPENDENT_AMBULATORY_CARE_PROVIDER_SITE_OTHER): Payer: Medicare Other | Admitting: Podiatry

## 2019-03-10 VITALS — Temp 98.7°F

## 2019-03-10 DIAGNOSIS — I872 Venous insufficiency (chronic) (peripheral): Secondary | ICD-10-CM

## 2019-03-10 DIAGNOSIS — L97512 Non-pressure chronic ulcer of other part of right foot with fat layer exposed: Secondary | ICD-10-CM | POA: Diagnosis not present

## 2019-03-10 DIAGNOSIS — L03119 Cellulitis of unspecified part of limb: Secondary | ICD-10-CM

## 2019-03-10 MED ORDER — DOXYCYCLINE HYCLATE 100 MG PO TABS: 100.0000 mg | ORAL_TABLET | Freq: Two times a day (BID) | ORAL | 0 refills | Status: DC

## 2019-03-12 DIAGNOSIS — I609 Nontraumatic subarachnoid hemorrhage, unspecified: Secondary | ICD-10-CM | POA: Diagnosis not present

## 2019-03-12 DIAGNOSIS — F22 Delusional disorders: Secondary | ICD-10-CM | POA: Diagnosis not present

## 2019-03-12 DIAGNOSIS — I10 Essential (primary) hypertension: Secondary | ICD-10-CM | POA: Diagnosis not present

## 2019-03-12 DIAGNOSIS — Z743 Need for continuous supervision: Secondary | ICD-10-CM | POA: Diagnosis not present

## 2019-03-12 DIAGNOSIS — I739 Peripheral vascular disease, unspecified: Secondary | ICD-10-CM | POA: Diagnosis not present

## 2019-03-12 DIAGNOSIS — L89893 Pressure ulcer of other site, stage 3: Secondary | ICD-10-CM | POA: Diagnosis not present

## 2019-03-14 NOTE — Progress Notes (Signed)
   Subjective:  85 y.o. female presenting today for follow up evaluation of ulcerations of the right foot. She states she is doing well. She reports some mild drainage. She has been having the dressings changed by home health as directed. There are no worsening factors noted. Patient is here for further evaluation and treatment.    Past Medical History:  Diagnosis Date  . Cataract   . Complete heart block (HCC)    s/p PPM implant (MDT) by Dr Blanch Media.  Atrial lead could not be paced at time of the procedure.  She has chronic AV dysociation  . Delusional disorder ([place])   . Exogenous obesity   . Hypercholesterolemia   . Invasive ductal carcinoma of breast ([place]) 03/2007   BILATERAL BREASTS  . Orthostatic hypotension    treated with midodrine by Dr Rollene [name]  . PONV (postoperative nausea and vomiting)   . Thyroid disease   . Venous insufficiency      Objective/Physical Exam General: The patient is alert and oriented x3 in no acute distress.  Dermatology:  Wound #1 noted to the right foot measuring 1.0 x 1.0 x 0.2 cm (LxWxD).   Wound #2 noted to the right 4th toe measuring 0.2 x 0.2 x 0.1 cm.   To the noted ulceration(s), there is no eschar. There is a moderate amount of slough, fibrin, and necrotic tissue noted. Granulation tissue and wound base is red. There is a minimal amount of serosanguineous drainage noted. There is no exposed bone muscle-tendon ligament or joint. There is no malodor. Periwound integrity is intact. Skin is warm, dry and supple bilateral lower extremities.  Vascular: Palpable pedal pulses bilaterally. Mild edema noted. Capillary refill within normal limits. Varicosities noted bilateral lower extremities.   Neurological: Epicritic and protective threshold diminished bilaterally.   Musculoskeletal Exam: Increased erythema and edema noted to the bilateral lower legs. Range of motion within normal limits to all pedal and ankle joints bilateral. Muscle strength 5/5 in  all groups bilateral.   Assessment: #1 ulceration of the right foot secondary to venous insufficiency #2 varicosities bilateral lower extremities #3 ulceration of the right 4th toe secondary to venous insufficiency  #4 Cellulitis bilateral lower legs   Plan of Care:  #1 Patient was evaluated.  #2 medically necessary excisional debridement including subcutaneous tissue was performed using a tissue nipper and a chisel blade. Excisional debridement of all the necrotic nonviable tissue down to healthy bleeding viable tissue was performed with post-debridement measurements same as pre-. #3 the wound was cleansed with normal saline. #4 Continue home health dressing changes.  #5 Prescription for Doxycycline provided to patient.  #6 Return to clinic in 4 weeks.      Edrick Kins, DPM [facility]  Dr. Edrick Kins, [address]                                        [place], [place] [address]                Office [phone]  Fax [phone]

## 2019-03-16 ENCOUNTER — Telehealth: Payer: Self-pay | Admitting: *Deleted

## 2019-03-16 DIAGNOSIS — I739 Peripheral vascular disease, unspecified: Secondary | ICD-10-CM | POA: Diagnosis not present

## 2019-03-16 DIAGNOSIS — I10 Essential (primary) hypertension: Secondary | ICD-10-CM | POA: Diagnosis not present

## 2019-03-16 DIAGNOSIS — Z743 Need for continuous supervision: Secondary | ICD-10-CM | POA: Diagnosis not present

## 2019-03-16 DIAGNOSIS — F22 Delusional disorders: Secondary | ICD-10-CM | POA: Diagnosis not present

## 2019-03-16 DIAGNOSIS — I609 Nontraumatic subarachnoid hemorrhage, unspecified: Secondary | ICD-10-CM | POA: Diagnosis not present

## 2019-03-16 DIAGNOSIS — L89893 Pressure ulcer of other site, stage 3: Secondary | ICD-10-CM | POA: Diagnosis not present

## 2019-03-16 NOTE — Telephone Encounter (Signed)
This is Dr. Rebekah [name] patient. I am forwarding this to him.

## 2019-03-16 NOTE — Telephone Encounter (Signed)
[name] states they are required to inform doctors of drug interaction, pt's antibiotic doxycycline and calcium have a potential for reaction.

## 2019-03-18 DIAGNOSIS — I10 Essential (primary) hypertension: Secondary | ICD-10-CM | POA: Diagnosis not present

## 2019-03-18 DIAGNOSIS — L89893 Pressure ulcer of other site, stage 3: Secondary | ICD-10-CM | POA: Diagnosis not present

## 2019-03-18 DIAGNOSIS — Z743 Need for continuous supervision: Secondary | ICD-10-CM | POA: Diagnosis not present

## 2019-03-18 DIAGNOSIS — I739 Peripheral vascular disease, unspecified: Secondary | ICD-10-CM | POA: Diagnosis not present

## 2019-03-18 DIAGNOSIS — F22 Delusional disorders: Secondary | ICD-10-CM | POA: Diagnosis not present

## 2019-03-18 DIAGNOSIS — I609 Nontraumatic subarachnoid hemorrhage, unspecified: Secondary | ICD-10-CM | POA: Diagnosis not present

## 2019-03-19 ENCOUNTER — Encounter: Payer: Self-pay | Admitting: Family Medicine

## 2019-03-19 ENCOUNTER — Other Ambulatory Visit: Payer: Self-pay

## 2019-03-19 ENCOUNTER — Ambulatory Visit (INDEPENDENT_AMBULATORY_CARE_PROVIDER_SITE_OTHER): Payer: Medicare Other | Admitting: Family Medicine

## 2019-03-19 VITALS — BP 130/80 | HR 87 | Temp 97.6°F | Resp 16 | Ht 67.0 in | Wt 224.0 lb

## 2019-03-19 DIAGNOSIS — G4700 Insomnia, unspecified: Secondary | ICD-10-CM

## 2019-03-19 DIAGNOSIS — I639 Cerebral infarction, unspecified: Secondary | ICD-10-CM | POA: Diagnosis not present

## 2019-03-19 DIAGNOSIS — R197 Diarrhea, unspecified: Secondary | ICD-10-CM | POA: Diagnosis not present

## 2019-03-19 DIAGNOSIS — I872 Venous insufficiency (chronic) (peripheral): Secondary | ICD-10-CM

## 2019-03-19 DIAGNOSIS — E039 Hypothyroidism, unspecified: Secondary | ICD-10-CM | POA: Diagnosis not present

## 2019-03-19 DIAGNOSIS — F22 Delusional disorders: Secondary | ICD-10-CM

## 2019-03-19 MED ORDER — LEVOTHYROXINE SODIUM 50 MCG PO TABS: 50.0000 ug | ORAL_TABLET | Freq: Every day | ORAL | 2 refills | Status: DC

## 2019-03-19 MED ORDER — TRIAMCINOLONE ACETONIDE 0.1 % EX CREA: 1.0000 "application " | TOPICAL_CREAM | Freq: Two times a day (BID) | CUTANEOUS | 1 refills | Status: DC

## 2019-03-19 MED ORDER — FUROSEMIDE 20 MG PO TABS: 10.0000 mg | ORAL_TABLET | Freq: Every day | ORAL | 2 refills | Status: DC

## 2019-03-19 NOTE — Progress Notes (Signed)
HPI:   [name] is a 85 y.o. female, who is here today for chronic disease management.   She was last seen on 11/30/18.  Anxiety and delusional disorder,she has refused to take medication and has not followed with psychiatrist as has been recommended. She denies visual or auditive hallucinations.  For months she has clamed that there is a woman that is "spraying " her food and legs, causing diarrhea and LE edema respectively.   Now she thinks her medications have also been "poison." This person is also calling her 2-3 times per day but when she answers nobody talks. She has reported this to local shrift and members of her church.  States that they have investigated and church members drive around her house but nobody has seen this person she talks about.  Hypothyroidism: Currently she is on Levothyroxine 50 mcg daily. Tolerating medication well, no side effects reported. She has not noted palpitations,changes in bowel habits, tremor, cold/heat intolerance, or abnormal weight loss.  Lab Results  Component Value Date   TSH 2.635 10/04/2018   Diarrhea: This is a chronic problem,she states that before she was told she has "a touch of crohn's." She has 3 stools daily,mianly in the morning. She has not noted blood in stool, abdominal pain,nausea,or vomiting. She has been on Creon ,she is not sure if she is taking this medication.  She is still eating take out meals.  [place] services, nurse visits 2 times per week. LE edema is worse at the end of the day. She is taking Furosemide 10 mg daily, not daily because it worsens urinary frequency. She took Myrbetriq because did not help much.  LE erythema and occasional clear exudate. Hx of venous stasis dermatitis. Denies fever,chills,or changes in appetite. States that her podiatrist recommended abx but it worsened diarrhea,so she is not taking it daily.  Topical steroid has helped. Denies chest pain,SOB,orthopnea,or  PND.  Denies dysuria and gross hematuria.  Lab Results  Component Value Date   CREATININE 1.02 11/30/2018   BUN 19 11/30/2018   NA 143 11/30/2018   K 3.6 11/30/2018   CL 106 11/30/2018   CO2 24 11/30/2018    Review of Systems  Constitutional: Negative for activity change and fatigue.  HENT: Negative for mouth sores, nosebleeds, sore throat and trouble swallowing.   Eyes: Negative for redness and visual disturbance.  Respiratory: Negative for cough, shortness of breath and wheezing.   Cardiovascular: Negative for chest pain, palpitations and leg swelling.  Gastrointestinal:       Negative for changes in bowel habits.  Genitourinary: Negative for decreased urine volume and difficulty urinating.  Musculoskeletal: Positive for arthralgias and gait problem.  Skin: Negative for wound.  Neurological: Negative for syncope, weakness and headaches.  Psychiatric/Behavioral: Positive for sleep disturbance (Sleeps 3-4 hours max.Trouble falling and staying asleep.). The patient is nervous/anxious.    Rest of ROS, see pertinent positives sand negatives in HPI  Current Outpatient Medications on File Prior to Visit  Medication Sig Dispense Refill  . Calcium Carbonate-Vitamin D (CALTRATE 600+D PO) Take 600 mg by mouth daily after breakfast.    . CREON 36000 units CPEP capsule     . doxycycline (VIBRA-TABS) 100 MG tablet Take 1 tablet (100 mg total) by mouth 2 (two) times daily. 20 tablet 0  . Lactobacillus Rhamnosus, GG, (CULTURELLE PO) Take 1 capsule by mouth every morning.    . latanoprost (XALATAN) 0.005 % ophthalmic solution Place 1 drop into both  eyes at bedtime.   10  . midodrine (PROAMATINE) 2.5 MG tablet Take 1 tablet (2.5 mg total) by mouth daily after breakfast. 90 tablet 2  . mirabegron ER (MYRBETRIQ) 25 MG TB24 tablet Take 1 tablet (25 mg total) by mouth daily. 30 tablet 3   No current facility-administered medications on file prior to visit.      Past Medical History:   Diagnosis Date  . Cataract   . Complete heart block (HCC)    s/p PPM implant (MDT) by Dr Blanch Media.  Atrial lead could not be paced at time of the procedure.  She has chronic AV dysociation  . Delusional disorder ([place])   . Exogenous obesity   . Hypercholesterolemia   . Invasive ductal carcinoma of breast ([place]) 03/2007   BILATERAL BREASTS  . Orthostatic hypotension    treated with midodrine by Dr Rollene [name]  . PONV (postoperative nausea and vomiting)   . Thyroid disease   . Venous insufficiency    Allergies  Allergen Reactions  . Amoxicillin-Pot Clavulanate Diarrhea and Nausea And Vomiting    Has patient had a PCN reaction causing immediate rash, facial/tongue/throat swelling, SOB or lightheadedness with hypotension: Yes Has patient had a PCN reaction causing severe rash involving mucus membranes or skin necrosis: No Has patient had a PCN reaction that required hospitalization: No Has patient had a PCN reaction occurring within the last 10 years: Yes If all of the above answers are "NO", then may proceed with Cephalosporin use.   . Erythromycin Hives  . Tape Other (See Comments)    BAND AIDS-SKIN IRRITATION  . Codeine Nausea And Vomiting  . Flagyl [Metronidazole] Hives  . Macrobid [Nitrofurantoin Macrocrystal] Nausea And Vomiting  . Tolterodine Nausea And Vomiting  . Ciprofloxacin Hives and Rash    Social History   Socioeconomic History  . Marital status: Widowed    Spouse name: Not on file  . Number of children: Not on file  . Years of education: Not on file  . Highest education level: Not on file  Occupational History  . Not on file  Social Needs  . Financial resource strain: Not on file  . Food insecurity    Worry: Not on file    Inability: Not on file  . Transportation needs    Medical: Not on file    Non-medical: Not on file  Tobacco Use  . Smoking status: Never Smoker  . Smokeless tobacco: Never Used  Substance and Sexual Activity  . Alcohol use: No     Alcohol/week: 0.0 standard drinks  . Drug use: No  . Sexual activity: Not Currently  Lifestyle  . Physical activity    Days per week: Not on file    Minutes per session: Not on file  . Stress: Not on file  Relationships  . Social Herbalist on phone: Not on file    Gets together: Not on file    Attends religious service: Not on file    Active member of club or organization: Not on file    Attends meetings of clubs or organizations: Not on file    Relationship status: Not on file  Other Topics Concern  . Not on file  Social History Narrative  . Not on file    Vitals:   03/19/19 0916  BP: 130/80  Pulse: 87  Resp: 16  Temp: 97.6 F (36.4 C)  SpO2: 98%   Body mass index is 35.09 kg/m.   Physical Exam  Nursing note and vitals reviewed. Constitutional: She is oriented to person, place, and time. She appears well-developed. No distress.  HENT:  Head: Normocephalic and atraumatic.  Mouth/Throat: Oropharynx is clear and moist and mucous membranes are normal.  Eyes: Pupils are equal, round, and reactive to light. Conjunctivae are normal.  Cardiovascular: Normal rate and regular rhythm.  No murmur heard. Pulses:      Dorsalis pedis pulses are 2+ on the right side and 2+ on the left side.  Respiratory: Effort normal and breath sounds normal. No respiratory distress.  GI: Soft. She exhibits no mass. There is no abdominal tenderness.  Musculoskeletal:        General: Edema present.  Neurological: She is alert and oriented to person, place, and time. She has normal strength. No cranial nerve deficit. Gait abnormal.  Gait assisted with a cane.  Skin: Skin is warm. There is erythema.  LE erythema from knee down. Crusty lesions covering areas of LE's. No local heat or tenderness.  Psychiatric: Her speech is normal. Her mood appears anxious.  Well groomed, good eye contact.     ASSESSMENT AND PLAN:   Ms. PASCALE DESCHAMP was seen today for chronic disease  management.  No orders of the defined types were placed in this encounter.   No problem-specific Assessment & Plan notes found for this encounter.  1. Insomnia, unspecified type She is not interested in medication at this time. She states that she does not want to sleep through the night afraid that this person she thinks is "spraying" her legs "will kill "her. Good sleep hygiene.  2. Venous stasis dermatitis of both lower extremities I do not see signs of infection at this time. Educated about side effects of abx. Continue topical steroid bid as needed. Side effects of Furosemide discussed, continue 10 mg daily. LE elevation and compression stocking may also help. [place] service to continue.  - furosemide (LASIX) 20 MG tablet; Take 0.5 tablets (10 mg total) by mouth daily.  Dispense: 45 tablet; Refill: 2  3. Diarrhea, unspecified type Possible etiologies discussed. Adequate hydration. She is not interested in GI evaluation.  4. Hypothyroidism, unspecified type Problem is well controlled. No changes in current management.  - levothyroxine (SYNTHROID) 50 MCG tablet; Take 1 tablet (50 mcg total) by mouth daily before breakfast.  Dispense: 90 tablet; Refill: 2  5. Delusional disorder [facility]) She is asking me to document the name of the person she thinks is getting in her house and "spraying" her food and legs, Janene Harvey. She is her death husband's cousin's wife. States that the sheriff,church members have not seen anybody in her house,so has not she. She does not want to see psychiatrist. At this time she is not a threat for herself or others, so I do not think she needs to be committed.    Return in about 6 months (around 09/16/2019).   -Ms. [name] was advised to return sooner than planned today if new concerns arise.   Ladonna Vanorder G. Martinique, MD  [facility]. [place] office.

## 2019-03-19 NOTE — Patient Instructions (Signed)
A few things to remember from today's visit:   Hypothyroidism, unspecified type - Plan: levothyroxine (SYNTHROID) 50 MCG tablet  Venous stasis dermatitis of both lower extremities - Plan: furosemide (LASIX) 20 MG tablet  Try to elevated your legs above heart level or consider compression stockings for swelling. No changes in your meds today. Please consider taking medication at night to help you sleep.   Please be sure medication list is accurate. If a new problem present, please set up appointment sooner than planned today.

## 2019-03-20 ENCOUNTER — Encounter: Payer: Self-pay | Admitting: Family Medicine

## 2019-03-23 DIAGNOSIS — Z743 Need for continuous supervision: Secondary | ICD-10-CM | POA: Diagnosis not present

## 2019-03-23 DIAGNOSIS — L89893 Pressure ulcer of other site, stage 3: Secondary | ICD-10-CM | POA: Diagnosis not present

## 2019-03-23 DIAGNOSIS — I10 Essential (primary) hypertension: Secondary | ICD-10-CM | POA: Diagnosis not present

## 2019-03-23 DIAGNOSIS — I609 Nontraumatic subarachnoid hemorrhage, unspecified: Secondary | ICD-10-CM | POA: Diagnosis not present

## 2019-03-23 DIAGNOSIS — F22 Delusional disorders: Secondary | ICD-10-CM | POA: Diagnosis not present

## 2019-03-23 DIAGNOSIS — I739 Peripheral vascular disease, unspecified: Secondary | ICD-10-CM | POA: Diagnosis not present

## 2019-03-26 DIAGNOSIS — I739 Peripheral vascular disease, unspecified: Secondary | ICD-10-CM | POA: Diagnosis not present

## 2019-03-26 DIAGNOSIS — I609 Nontraumatic subarachnoid hemorrhage, unspecified: Secondary | ICD-10-CM | POA: Diagnosis not present

## 2019-03-26 DIAGNOSIS — Z743 Need for continuous supervision: Secondary | ICD-10-CM | POA: Diagnosis not present

## 2019-03-26 DIAGNOSIS — L89893 Pressure ulcer of other site, stage 3: Secondary | ICD-10-CM | POA: Diagnosis not present

## 2019-03-26 DIAGNOSIS — F22 Delusional disorders: Secondary | ICD-10-CM | POA: Diagnosis not present

## 2019-03-26 DIAGNOSIS — I10 Essential (primary) hypertension: Secondary | ICD-10-CM | POA: Diagnosis not present

## 2019-03-29 ENCOUNTER — Telehealth: Payer: Self-pay | Admitting: *Deleted

## 2019-03-29 DIAGNOSIS — L89893 Pressure ulcer of other site, stage 3: Secondary | ICD-10-CM | POA: Diagnosis not present

## 2019-03-29 DIAGNOSIS — F22 Delusional disorders: Secondary | ICD-10-CM | POA: Diagnosis not present

## 2019-03-29 DIAGNOSIS — I739 Peripheral vascular disease, unspecified: Secondary | ICD-10-CM | POA: Diagnosis not present

## 2019-03-29 DIAGNOSIS — I10 Essential (primary) hypertension: Secondary | ICD-10-CM | POA: Diagnosis not present

## 2019-03-29 DIAGNOSIS — Z743 Need for continuous supervision: Secondary | ICD-10-CM | POA: Diagnosis not present

## 2019-03-29 DIAGNOSIS — I609 Nontraumatic subarachnoid hemorrhage, unspecified: Secondary | ICD-10-CM | POA: Diagnosis not present

## 2019-03-29 NOTE — Telephone Encounter (Signed)
[facility] - [name] states pt has a new wound to the right fourth toe and that Dr. [name] had said to dress with the calcium alginate same as the bottom of the foot.

## 2019-03-30 NOTE — Telephone Encounter (Signed)
Yes, same orders for both wounds.

## 2019-03-30 NOTE — Telephone Encounter (Signed)
I spoke with [name] - [facility] and informed of Dr. [name] orders of 03/30/2019 1:04pm.

## 2019-03-31 DIAGNOSIS — I739 Peripheral vascular disease, unspecified: Secondary | ICD-10-CM | POA: Diagnosis not present

## 2019-03-31 DIAGNOSIS — Z743 Need for continuous supervision: Secondary | ICD-10-CM | POA: Diagnosis not present

## 2019-03-31 DIAGNOSIS — I609 Nontraumatic subarachnoid hemorrhage, unspecified: Secondary | ICD-10-CM | POA: Diagnosis not present

## 2019-03-31 DIAGNOSIS — I10 Essential (primary) hypertension: Secondary | ICD-10-CM | POA: Diagnosis not present

## 2019-03-31 DIAGNOSIS — F22 Delusional disorders: Secondary | ICD-10-CM | POA: Diagnosis not present

## 2019-03-31 DIAGNOSIS — L89893 Pressure ulcer of other site, stage 3: Secondary | ICD-10-CM | POA: Diagnosis not present

## 2019-04-02 ENCOUNTER — Telehealth: Payer: Self-pay | Admitting: Family Medicine

## 2019-04-02 ENCOUNTER — Emergency Department (HOSPITAL_COMMUNITY): Payer: Medicare Other

## 2019-04-02 ENCOUNTER — Emergency Department (HOSPITAL_COMMUNITY)
Admission: EM | Admit: 2019-04-02 | Discharge: 2019-04-03 | Disposition: A | Discharge status: Other Acute Inpatient Hospital | Payer: Medicare Other | Attending: Emergency Medicine | Admitting: Emergency Medicine

## 2019-04-02 ENCOUNTER — Encounter (HOSPITAL_COMMUNITY): Payer: Self-pay | Admitting: Clinical

## 2019-04-02 ENCOUNTER — Other Ambulatory Visit: Payer: Self-pay

## 2019-04-02 DIAGNOSIS — Z79899 Other long term (current) drug therapy: Secondary | ICD-10-CM | POA: Insufficient documentation

## 2019-04-02 DIAGNOSIS — R4182 Altered mental status, unspecified: Secondary | ICD-10-CM | POA: Diagnosis present

## 2019-04-02 DIAGNOSIS — Z950 Presence of cardiac pacemaker: Secondary | ICD-10-CM | POA: Insufficient documentation

## 2019-04-02 DIAGNOSIS — F419 Anxiety disorder, unspecified: Secondary | ICD-10-CM | POA: Diagnosis present

## 2019-04-02 DIAGNOSIS — F22 Delusional disorders: Secondary | ICD-10-CM | POA: Diagnosis present

## 2019-04-02 DIAGNOSIS — Z20828 Contact with and (suspected) exposure to other viral communicable diseases: Secondary | ICD-10-CM | POA: Insufficient documentation

## 2019-04-02 DIAGNOSIS — I2510 Atherosclerotic heart disease of native coronary artery without angina pectoris: Secondary | ICD-10-CM | POA: Diagnosis not present

## 2019-04-02 DIAGNOSIS — N390 Urinary tract infection, site not specified: Secondary | ICD-10-CM | POA: Diagnosis not present

## 2019-04-02 DIAGNOSIS — E039 Hypothyroidism, unspecified: Secondary | ICD-10-CM | POA: Diagnosis not present

## 2019-04-02 LAB — CBC
HCT: 43.3 % (ref 36.0–46.0)
Hemoglobin: 13.3 g/dL (ref 12.0–15.0)
MCH: 30.9 pg (ref 26.0–34.0)
MCHC: 30.7 g/dL (ref 30.0–36.0)
MCV: 100.7 fL — ABNORMAL HIGH (ref 80.0–100.0)
Platelets: 250 10*3/uL (ref 150–400)
RBC: 4.3 MIL/uL (ref 3.87–5.11)
RDW: 13.7 % (ref 11.5–15.5)
WBC: 7.1 10*3/uL (ref 4.0–10.5)
nRBC: 0 % (ref 0.0–0.2)

## 2019-04-02 LAB — COMPREHENSIVE METABOLIC PANEL
ALT: 12 U/L (ref 0–44)
AST: 17 U/L (ref 15–41)
Albumin: 3.8 g/dL (ref 3.5–5.0)
Alkaline Phosphatase: 96 U/L (ref 38–126)
Anion gap: 10 (ref 5–15)
BUN: 15 mg/dL (ref 8–23)
CO2: 26 mmol/L (ref 22–32)
Calcium: 8.7 mg/dL — ABNORMAL LOW (ref 8.9–10.3)
Chloride: 107 mmol/L (ref 98–111)
Creatinine, Ser: 1.15 mg/dL — ABNORMAL HIGH (ref 0.44–1.00)
GFR calc Af Amer: 49 mL/min — ABNORMAL LOW (ref >60)
GFR calc non Af Amer: 42 mL/min — ABNORMAL LOW (ref >60)
Glucose, Bld: 95 mg/dL (ref 70–99)
Potassium: 3.3 mmol/L — ABNORMAL LOW (ref 3.5–5.1)
Sodium: 143 mmol/L (ref 135–145)
Total Bilirubin: 0.7 mg/dL (ref 0.3–1.2)
Total Protein: 7.4 g/dL (ref 6.5–8.1)

## 2019-04-02 LAB — URINALYSIS, ROUTINE W REFLEX MICROSCOPIC
Bilirubin Urine: NEGATIVE
Glucose, UA: NEGATIVE mg/dL
Ketones, ur: NEGATIVE mg/dL
Nitrite: NEGATIVE
Protein, ur: 30 mg/dL — AB
Specific Gravity, Urine: 1.018 (ref 1.005–1.030)
WBC, UA: >50 WBC/hpf — ABNORMAL HIGH (ref 0–5)
pH: 5 (ref 5.0–8.0)

## 2019-04-02 LAB — TSH: TSH: 5.436 u[IU]/mL — ABNORMAL HIGH (ref 0.350–4.500)

## 2019-04-02 LAB — ETHANOL: Alcohol, Ethyl (B): <10 mg/dL (ref <10)

## 2019-04-02 LAB — CBG MONITORING, ED: Glucose-Capillary: 85 mg/dL (ref 70–99)

## 2019-04-02 LAB — LACTIC ACID, PLASMA: Lactic Acid, Venous: 0.9 mmol/L (ref 0.5–1.9)

## 2019-04-02 MED ORDER — FOSFOMYCIN TROMETHAMINE 3 G PO PACK
3.0000 g | PACK | Freq: Once | ORAL | Status: AC
  Administered 2019-04-02: 21:00:00 3 g via ORAL
  Filled 2019-04-02: qty 3

## 2019-04-02 MED ORDER — FUROSEMIDE 20 MG PO TABS
10.0000 mg | ORAL_TABLET | Freq: Every day | ORAL | Status: DC
  Administered 2019-04-03: 10 mg via ORAL
  Filled 2019-04-02: qty 0.5

## 2019-04-02 MED ORDER — MIDODRINE HCL 2.5 MG PO TABS
2.5000 mg | ORAL_TABLET | Freq: Every day | ORAL | Status: DC
  Administered 2019-04-03: 2.5 mg via ORAL
  Filled 2019-04-02 (×2): qty 1

## 2019-04-02 MED ORDER — RISAQUAD PO CAPS
1.0000 | ORAL_CAPSULE | Freq: Every morning | ORAL | Status: DC
  Administered 2019-04-03: 1 via ORAL
  Filled 2019-04-02: qty 1

## 2019-04-02 MED ORDER — LEVOTHYROXINE SODIUM 50 MCG PO TABS
50.0000 ug | ORAL_TABLET | Freq: Every day | ORAL | Status: DC
  Administered 2019-04-03: 50 ug via ORAL
  Filled 2019-04-02 (×2): qty 1

## 2019-04-02 MED ORDER — SODIUM CHLORIDE 0.9 % IV SOLN
1.0000 g | Freq: Once | INTRAVENOUS | Status: DC
  Filled 2019-04-02: qty 10

## 2019-04-02 MED ORDER — LATANOPROST 0.005 % OP SOLN
1.0000 [drp] | Freq: Every day | OPHTHALMIC | Status: DC
  Administered 2019-04-03: 1 [drp] via OPHTHALMIC
  Filled 2019-04-02: qty 2.5

## 2019-04-02 MED ORDER — SODIUM CHLORIDE 0.9% FLUSH
3.0000 mL | Freq: Once | INTRAVENOUS | Status: AC
  Administered 2019-04-02: 3 mL via INTRAVENOUS

## 2019-04-02 MED ORDER — PANCRELIPASE (LIP-PROT-AMYL) 36000-114000 UNITS PO CPEP
36000.0000 [IU] | ORAL_CAPSULE | Freq: Three times a day (TID) | ORAL | Status: DC
  Filled 2019-04-02 (×4): qty 1

## 2019-04-02 NOTE — ED Triage Notes (Signed)
EMS called from home d/t patient being paranoid that family member has been "poisoning her legs". Patient presents with bilateral cellulitis of lower legs. Patient POA reports patient has had auditory and visual hallucinations x6 months. Patient states she feels safer at hospital than home.   BP 152/94 P 96 RR 18 CBG 94

## 2019-04-02 NOTE — ED Notes (Signed)
Yellow socks placed on patient. Patient assisted to restroom.

## 2019-04-02 NOTE — Telephone Encounter (Signed)
[name] with [facility] is calling to report that the patient is having visual/audiotary hallucinations.  Patient does have celitis on both of the legs.   Will take her to the [facility]

## 2019-04-02 NOTE — ED Notes (Signed)
TTS monitor placed in patient's room for consult.

## 2019-04-02 NOTE — ED Notes (Signed)
Patient transported to CT 

## 2019-04-02 NOTE — ED Provider Notes (Signed)
[facility] DEPT Provider Note   CSN: [id number] Arrival date & time: 04/02/19  1732     History   Chief Complaint Chief Complaint  Patient presents with  . Altered Mental Status    HPI [name] is a 85 y.o. female with a past medical history of complete heart block status post pacemaker, venous insufficiency, Thyroid disease, chronic wounds of the right foot, who presents today for evaluation of confusion.  Patient reports that she is here today as she was speaking with the sheriff's regarding a woman who has been coming into her house and spraying poison on her legs.  She states that for multiple months her dead husband's cousin's wife named Janene Harvey has been coming into her house and spraying her legs with poison.  She states that she also reports poison on the toilet paper that makes her itch and sprays the poison on her food making her have diarrhea.  She states that she normally sleeps with her left leg down and her right leg up therefore her left leg is partially protected from the spraying of poison.  I spoke with her power of attorney who reports that for the past 4 years she has been making the complaints of poison being sprayed.  He states that she has never complained of the poison being sprayed on the toilet paper before and that this is the worst he has seen her.         HPI  Past Medical History:  Diagnosis Date  . Cataract   . Complete heart block (HCC)    s/p PPM implant (MDT) by Dr Blanch Media.  Atrial lead could not be paced at time of the procedure.  She has chronic AV dysociation  . Delusional disorder ([place])   . Exogenous obesity   . Hypercholesterolemia   . Invasive ductal carcinoma of breast ([place]) 03/2007   BILATERAL BREASTS  . Orthostatic hypotension    treated with midodrine by Dr Rollene [name]  . PONV (postoperative nausea and vomiting)   . Thyroid disease   . Venous insufficiency     Patient Active Problem List     Diagnosis Date Noted  . Spontaneous subarachnoid hemorrhage ([place]) 10/04/2018  . Unstable gait 01/19/2018  . Venous stasis dermatitis of both lower extremities 01/09/2018  . Diarrhea, chronic 01/09/2018  . Lower extremity ulceration ([place]) 10/22/2017  . Anxiety disorder 10/22/2017  . PONV (postoperative nausea and vomiting)   . Orthostatic hypotension   . Hypercholesterolemia   . Exogenous obesity   . Rectal bleeding 03/30/2017  . GI bleed 03/28/2017  . BRBPR (bright red blood per rectum) 03/27/2017  . Sigmoid diverticulitis 10/14/2016  . Diverticulitis 10/14/2016  . Diarrhea of presumed infectious origin 05/11/2016  . Varicose veins of bilateral lower extremities with other complications [id number]  . Venous insufficiency 08/25/2015  . Cellulitis 08/09/2015  . Pressure ulcer 08/09/2015  . CAD (coronary artery disease) 07/07/2015  . Open wound of right foot 07/07/2015  . Delusional disorder ([place]) 07/05/2015  . Bereavement reaction 07/05/2015  . Pacemaker at end of battery life 02/23/2015  . Acute GI bleeding 02/15/2015  . Acute kidney injury (nontraumatic) ([place]) 02/15/2015  . Hypothyroidism 02/15/2015  . Left upper quadrant pain   . Osteopenia 12/22/2013  . Postural dizziness 04/05/2013  . S/P placement of cardiac pacemaker 02/26/2013  . Bilateral breast cancer ([place]) 03/25/2011  . Invasive ductal carcinoma of breast ([place]) 03/30/2007    Past Surgical History:  Procedure Laterality Date  .  ABDOMINAL HYSTERECTOMY    . BREAST LUMPECTOMY Bilateral 2009  . CATARACT EXTRACTION     EYE SURGERY X 2  . CHOLECYSTECTOMY    . COLONOSCOPY N/A 02/16/2015   Procedure: COLONOSCOPY;  Surgeon: Wilford Corner, MD;  Location[facility] ENDOSCOPY;  Service: Endoscopy;  Laterality: N/A;  . EP IMPLANTABLE DEVICE N/A 02/23/2015   pacemaker generator change (MDT Sensia SR) by Dr Rayann Heman   . ESOPHAGOGASTRODUODENOSCOPY N/A 02/16/2015   Procedure: ESOPHAGOGASTRODUODENOSCOPY (EGD);  Surgeon: Wilford Corner, MD;  Location: [facility] ENDOSCOPY;  Service: Endoscopy;  Laterality: N/A;  . PACEMAKER INSERTION     MDT implanted by Dr Blanch Media.  Atrial lead placement was unsuccessful.  She has chronic AV dysociation  . remote right radical nephrectomy  2009     OB History   No obstetric history on file.      Home Medications    Prior to Admission medications   Medication Sig Start Date End Date Taking? Authorizing Provider  Calcium Carbonate-Vitamin D (CALTRATE 600+D PO) Take 600 mg by mouth daily after breakfast.   Yes [provider]  CREON 36000 units CPEP capsule Take 36,000 Units by mouth 3 (three) times daily before meals.  06/24/18  Yes [provider]  furosemide (LASIX) 20 MG tablet Take 0.5 tablets (10 mg total) by mouth daily. 03/19/19  Yes Martinique, [name], MD  Lactobacillus Rhamnosus, GG, (CULTURELLE PO) Take 1 capsule by mouth every morning.   Yes [provider]  latanoprost (XALATAN) 0.005 % ophthalmic solution Place 1 drop into both eyes at bedtime.  07/03/17  Yes [provider]  levothyroxine (SYNTHROID) 50 MCG tablet Take 1 tablet (50 mcg total) by mouth daily before breakfast. 03/19/19  Yes Martinique, [name], MD  midodrine (PROAMATINE) 2.5 MG tablet Take 1 tablet (2.5 mg total) by mouth daily after breakfast. 12/15/18  Yes Martinique, [name], MD  triamcinolone cream (KENALOG) 0.1 % Apply 1 application topically 2 (two) times daily. 03/19/19  Yes Martin[place], [name], MD  doxycycline (VIBRA-TABS) 100 MG tablet Take 1 tablet (100 mg total) by mouth 2 (two) times daily. Patient not taking: Reported on 04/02/2019 03/10/19   Edrick Kins, DPM  mirabegron ER (MYRBETRIQ) 25 MG TB24 tablet Take 1 tablet (25 mg total) by mouth daily. Patient not taking: Reported on 04/02/2019 12/11/18   Martinique, [name], MD    Family History Family History  Problem Relation Age of Onset  . Heart disease Maternal Grandmother   . Heart disease Mother     Social History Social  History   Tobacco Use  . Smoking status: Never Smoker  . Smokeless tobacco: Never Used  Substance Use Topics  . Alcohol use: No    Alcohol/week: 0.0 standard drinks  . Drug use: No     Allergies   Amoxicillin-pot clavulanate, Erythromycin, Tape, Codeine, Flagyl [metronidazole], Macrobid [nitrofurantoin macrocrystal], Tolterodine, and Ciprofloxacin   Review of Systems Review of Systems  Unable to perform ROS: Psychiatric disorder     Physical Exam Updated Vital Signs BP (!) 110/53   Pulse (!) 59   Temp 97.8 F (36.6 C) (Oral)   Resp 18   Ht 5\' 7"  (1.702 m)   Wt 113.4 kg   SpO2 98%   BMI 39.16 kg/m   Physical Exam Vitals signs and nursing note reviewed.  Constitutional:      Appearance: She is well-developed.     Comments: Mildly agitated.   HENT:     Head: Normocephalic and atraumatic.  Mouth/Throat:     Mouth: Mucous membranes are moist.  Eyes:     General: No scleral icterus.       Right eye: No discharge.        Left eye: No discharge.     Conjunctiva/sclera: Conjunctivae normal.  Neck:     Musculoskeletal: Normal range of motion.  Cardiovascular:     Rate and Rhythm: Normal rate and regular rhythm.  Pulmonary:     Effort: Pulmonary effort is normal. No respiratory distress.     Breath sounds: No stridor.  Abdominal:     General: Abdomen is flat. There is no distension.     Tenderness: There is no abdominal tenderness.  Musculoskeletal:        General: No deformity.     Right lower leg: Edema present.     Left lower leg: Edema present.     Comments: Swelling of bilateral lower extremities.  Skin:    General: Skin is warm and dry.     Comments: Please see clinical image.  There is red and thickened skin to the bilateral lower extremities.  There is a ulcer on the right foot without clear evidence of secondary infection.  No purulent drainage.  Neurological:     Mental Status: She is alert.     Motor: No abnormal muscle tone.  Psychiatric:      Comments: Please see HPI.   Patient reports seeing a specific person.  She has a longstanding delusion that this person is spraying poison on her and her medications.  She states that she sees the person and hears a person raising concern for AVH however denies other AVH.                 ED Treatments / Results  Labs (all labs ordered are listed, but only abnormal results are displayed) Labs Reviewed  COMPREHENSIVE METABOLIC PANEL - Abnormal; Notable for the following components:      Result Value   Potassium 3.3 (*)    Creatinine, Ser 1.15 (*)    Calcium 8.7 (*)    GFR calc non Af Amer 42 (*)    GFR calc Af Amer 49 (*)    All other components within normal limits  CBC - Abnormal; Notable for the following components:   MCV 100.7 (*)    All other components within normal limits  URINALYSIS, ROUTINE W REFLEX MICROSCOPIC - Abnormal; Notable for the following components:   APPearance HAZY (*)    Hgb urine dipstick SMALL (*)    Protein, ur 30 (*)    Leukocytes,Ua LARGE (*)    WBC, UA >50 (*)    Bacteria, UA FEW (*)    All other components within normal limits  TSH - Abnormal; Notable for the following components:   TSH 5.436 (*)    All other components within normal limits  URINE CULTURE  CULTURE, BLOOD (ROUTINE X 2)  CULTURE, BLOOD (ROUTINE X 2)  SARS CORONAVIRUS 2 (TAT 6-24 HRS)  LACTIC ACID, PLASMA  ETHANOL  LACTIC ACID, PLASMA  CBG MONITORING, ED    EKG EKG Interpretation  Date/Time:  Friday April 02 2019 17:48:00 EST Ventricular Rate:  61 PR Interval:    QRS Duration: 168 QT Interval:  496 QTC Calculation: 500 R Axis:   -77 Text Interpretation: Ventricular-paced complexes No further analysis attempted due to paced rhythm Confirmed by Dene Gentry [phone]) on 04/02/2019 6:22:08 PM   Radiology Ct Head Wo Contrast  Result Date: 04/02/2019  CLINICAL DATA:  Altered LOC EXAM: CT HEAD WITHOUT CONTRAST TECHNIQUE: Contiguous axial images were obtained  from the base of the skull through the vertex without intravenous contrast. COMPARISON:  CT 10/05/2018 FINDINGS: Brain: No acute territorial infarction, hemorrhage or intracranial mass. Atrophy and scattered hypodensity in the white matter consistent with chronic small vessel ischemic change. Stable ventricle size. Vascular: No hyperdense vessels.  Carotid vascular calcification Skull: Normal. Negative for fracture or focal lesion. Sinuses/Orbits: Postsurgical changes of the ethmoid and sphenoid sinuses. Mucosal thickening is present. Chronic appearing nasal bone deformity Other: None IMPRESSION: 1. No CT evidence for acute intracranial abnormality. 2. Atrophy and chronic small vessel ischemic changes of the white matter. Electronically Signed   By: Donavan Foil M.D.   On: 04/02/2019 19:52    Procedures Procedures (including critical care time)  Medications Ordered in ED Medications  lipase/protease/amylase (CREON) capsule 36,000 Units (has no administration in time range)  furosemide (LASIX) tablet 10 mg (has no administration in time range)  Culturelle CAPS 1 capsule (has no administration in time range)  latanoprost (XALATAN) 0.005 % ophthalmic solution 1 drop (has no administration in time range)  levothyroxine (SYNTHROID) tablet 50 mcg (has no administration in time range)  midodrine (PROAMATINE) tablet 2.5 mg (has no administration in time range)  sodium chloride flush (NS) 0.9 % injection 3 mL (3 mLs Intravenous Given 04/02/19 1813)  fosfomycin (MONUROL) packet 3 g (3 g Oral Given 04/02/19 2125)     Initial Impression / Assessment and Plan / ED Course  I have reviewed the triage vital signs and the nursing notes.  Pertinent labs & imaging results that were available during my care of the patient were reviewed by me and considered in my medical decision making (see chart for details).  Clinical Course as of Apr 01 2250  Fri Apr 02, 2019  1817 Attempted to call listed POA with no  answer.    [EH]  1822 POA reports that for 4+ years the woman has been spraying her food with poison.   He reports that when he saw her today she stated that she had stopped taking her medications.  He is unsure when she stopped taking them.  POA reports that she was seen by the sheriff a week ago however was not today.  He reports that this is the first time she has reported having her toilet paper sprayed with the poison and he states that "this is the most off the chain I have seen her."    [EH]  1824 She reportedly stopped taking her medications today. Sheriff was there a week ago    [EH]  2026 Spoke with hospitalist who recommended treatment with single dose of fosfomycin and psych consult. Stating "this is a simple UTI."    [EH]    Clinical Course User Index [EH] Lorin Glass, PA-C      Patient presents today for evaluation of worsening delusions.  History obtained from patient, chart review, and power of attorney.  Patient has a longstanding history of delusions of someone spraying poison on her legs, her foods and is now spraying it on her toilet paper.  She reports that she has new pain/itching from the poison being sprayed on her toilet paper.  Here she is afebrile, not tachycardic or tachypneic, white count is not elevated and lactic acid is normal.  Urine cath was obtained showing over 50 WBCs, few bacteria with 0-5 squamous epithelial cells and 6-10 RBCs concerning for infection.  Urine  and blood was sent for culture. She does have changes consistent with chronic stasis dermatitis of her bilateral lower extremities, which according to her POA who saw her earlier today is unchanged from her baseline.    Patient reportedly lives alone at home, drives herself to her appointments and to get groceries.  I am concerned about patient's safety and ability to function at home alone given the degree of her delusions.  I attempted to admit patient for worsening delusions in the setting  of UTI.  I spoke with Dr. Si Raider of hospitalist who stated that patient doesn't need a medical admission, to treat her with fosfomycin and that it "sounds like a psych concern."    Patient is treated with fosfomycin.  She does have blood and urine cultures pending.  I spoke with her POA who agrees with admission.  TTS is consulted.  At this time hospitalist does not feel like patient requires medical admission and therefore is cleared for psychiatric placement as indicated.  Home medications ordered.      This patient was seen as a shared visit with Dr. Francia Greaves.   Final Clinical Impressions(s) / ED Diagnoses   Final diagnoses:  Delusional disorder [facility])  Lower urinary tract infectious disease    ED Discharge Orders    None       Ollen Gross 04/02/19 2258    Valarie Merino, MD 04/05/19 1037

## 2019-04-02 NOTE — [facility] Counselor (Signed)
2044- TTS contacted [facility] ED to set up telepsych

## 2019-04-02 NOTE — ED Notes (Signed)
Patient refused in and out catheter to obtain urine sample. Patient able to use bedpan to obtain urine sample.
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
## 2019-04-03 DIAGNOSIS — Z91048 Other nonmedicinal substance allergy status: Secondary | ICD-10-CM | POA: Diagnosis not present

## 2019-04-03 DIAGNOSIS — Z880 Allergy status to penicillin: Secondary | ICD-10-CM | POA: Diagnosis not present

## 2019-04-03 DIAGNOSIS — I2510 Atherosclerotic heart disease of native coronary artery without angina pectoris: Secondary | ICD-10-CM | POA: Diagnosis not present

## 2019-04-03 DIAGNOSIS — N390 Urinary tract infection, site not specified: Secondary | ICD-10-CM | POA: Diagnosis not present

## 2019-04-03 DIAGNOSIS — E7800 Pure hypercholesterolemia, unspecified: Secondary | ICD-10-CM | POA: Diagnosis present

## 2019-04-03 DIAGNOSIS — Z950 Presence of cardiac pacemaker: Secondary | ICD-10-CM | POA: Diagnosis not present

## 2019-04-03 DIAGNOSIS — F22 Delusional disorders: Secondary | ICD-10-CM | POA: Diagnosis present

## 2019-04-03 DIAGNOSIS — Z79899 Other long term (current) drug therapy: Secondary | ICD-10-CM | POA: Diagnosis not present

## 2019-04-03 DIAGNOSIS — Z888 Allergy status to other drugs, medicaments and biological substances status: Secondary | ICD-10-CM | POA: Diagnosis not present

## 2019-04-03 DIAGNOSIS — Z881 Allergy status to other antibiotic agents status: Secondary | ICD-10-CM | POA: Diagnosis not present

## 2019-04-03 DIAGNOSIS — E039 Hypothyroidism, unspecified: Secondary | ICD-10-CM | POA: Diagnosis not present

## 2019-04-03 DIAGNOSIS — Z20828 Contact with and (suspected) exposure to other viral communicable diseases: Secondary | ICD-10-CM | POA: Diagnosis not present

## 2019-04-03 LAB — SARS CORONAVIRUS 2 (TAT 6-24 HRS): SARS Coronavirus 2: NEGATIVE

## 2019-04-03 MED ORDER — BUSPIRONE HCL 10 MG PO TABS
5.0000 mg | ORAL_TABLET | Freq: Once | ORAL | Status: AC
  Administered 2019-04-03: 5 mg via ORAL

## 2019-04-03 MED ORDER — CEPHALEXIN 500 MG PO CAPS
500.0000 mg | ORAL_CAPSULE | Freq: Two times a day (BID) | ORAL | Status: DC
  Administered 2019-04-03: 500 mg via ORAL
  Filled 2019-04-03: qty 1
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
## 2019-04-03 NOTE — ED Notes (Signed)
Patient resting peacefully with no complaints or needs at this time.

## 2019-04-03 NOTE — Progress Notes (Signed)
04/03/2019  1517 Called [phone] to arrange transportation for patient to go to [facility].

## 2019-04-03 NOTE — Progress Notes (Signed)
04/03/2019  [place] [phone] and gave report.

## 2019-04-03 NOTE — Progress Notes (Signed)
04/03/2019  [facility] [phone] Per staff they do not have orders to receive patient. Transferred to intake and spoke with Jacqulynn Cadet. Per Dellis Filbert he spoke with Baldo Ash and told her he would call her after their patient was discharged to arrange transport for patient. Waiting for Baldo Ash to receive call from [place] before I can give report.

## 2019-04-03 NOTE — Progress Notes (Signed)
This patient has been accepted to [facility] for gero-psych admission.  Accepting provider: Dr.Victor Rosado  RN Call for Report: [place], [place] Social Worker [phone]

## 2019-04-03 NOTE — Progress Notes (Addendum)
This patient continues to meet inpatient gero-psych criteria. CSW faxed information to the following facilities:   [facility]- reviewing patient, requested patient to be IVC'd for safe transportation. CSW attempted to follow up with Rosana Hoes multiple times. [facility]  This patient is currently voluntary.  Stephanie Acre, [place] Social Worker [phone]

## 2019-04-03 NOTE — Progress Notes (Signed)
CSW received call from admissions at [facility]. They are ready to receive the patient and are aware that the patient will be a voluntary admission.  Stephanie Acre, [place] Social Worker [phone]

## 2019-04-03 NOTE — Progress Notes (Signed)
04/03/2019  1921  Called [facility] [phone] to take patient to [facility].

## 2019-04-04 LAB — URINE CULTURE

## 2019-04-05 NOTE — Telephone Encounter (Signed)
Pt seen at ED on 12/4.

## 2019-04-05 NOTE — Telephone Encounter (Signed)
Message sent to Dr. Jordan for review. 

## 2019-04-06 NOTE — Telephone Encounter (Signed)
Noted. [name] has Hx of diagnostic bilateral venous stasis dermatitis and delusional disorder.  She has refused to follow with psychiatrist.] Visual hallucinations is a new problem.  It seems like she was transferred to another facility. Laneshia Pina [place], MD

## 2019-04-07 ENCOUNTER — Ambulatory Visit (INDEPENDENT_AMBULATORY_CARE_PROVIDER_SITE_OTHER): Payer: Medicare Other | Admitting: Podiatry

## 2019-04-07 ENCOUNTER — Other Ambulatory Visit: Payer: Self-pay

## 2019-04-07 DIAGNOSIS — L97512 Non-pressure chronic ulcer of other part of right foot with fat layer exposed: Secondary | ICD-10-CM | POA: Diagnosis not present

## 2019-04-07 DIAGNOSIS — I872 Venous insufficiency (chronic) (peripheral): Secondary | ICD-10-CM

## 2019-04-07 LAB — CULTURE, BLOOD (ROUTINE X 2)
Culture: NO GROWTH
Culture: NO GROWTH

## 2019-04-09 NOTE — Telephone Encounter (Signed)
Pt called stating she received a call from [place] at [facility] stating she is unable to continue home care for the patient stating that the orders had been discontinued after she was admitted to the hospital. Pt is calling concerned that her dressings needs to be changed and that home health will not come out. Please give [facility] a call to follow up and give patient a call.

## 2019-04-10 NOTE — Progress Notes (Signed)
   Subjective:  85 y.o. female presenting today for follow up evaluation of ulcerations of the right foot. She states she is doing well but denies any significant differenct in the wounds. She has taken the Doxycycline and states the infection looks better. She has been having home health change the dressings. Patient is here for further evaluation and treatment.   Past Medical History:  Diagnosis Date  . Cataract   . Complete heart block (HCC)    s/p PPM implant (MDT) by Dr Blanch Media.  Atrial lead could not be paced at time of the procedure.  She has chronic AV dysociation  . Delusional disorder ([place])   . Exogenous obesity   . Hypercholesterolemia   . Invasive ductal carcinoma of breast ([place]) 03/2007   BILATERAL BREASTS  . Orthostatic hypotension    treated with midodrine by Dr Rollene [name]  . PONV (postoperative nausea and vomiting)   . Thyroid disease   . Venous insufficiency      Objective/Physical Exam General: The patient is alert and oriented x3 in no acute distress.  Dermatology:  Wound #1 noted to the right foot measuring 1.0 x 1.0 x 0.2 cm (LxWxD).   Wound #2 noted to the right 4th toe measuring 0.1 x 0.1 x 0.1 cm.   To the noted ulceration(s), there is no eschar. There is a moderate amount of slough, fibrin, and necrotic tissue noted. Granulation tissue and wound base is red. There is a minimal amount of serosanguineous drainage noted. There is no exposed bone muscle-tendon ligament or joint. There is no malodor. Periwound integrity is intact. Skin is warm, dry and supple bilateral lower extremities.  Vascular: Palpable pedal pulses bilaterally. Mild edema noted. Capillary refill within normal limits. Varicosities noted bilateral lower extremities.   Neurological: Epicritic and protective threshold diminished bilaterally.   Musculoskeletal Exam: Range of motion within normal limits to all pedal and ankle joints bilateral. Muscle strength 5/5 in all groups bilateral.    Assessment: #1 ulceration of the right foot secondary to venous insufficiency #2 varicosities bilateral lower extremities #3 ulceration of the right 4th toe secondary to venous insufficiency  #4 Cellulitis bilateral lower legs - resolved   Plan of Care:  #1 Patient was evaluated.  #2 medically necessary excisional debridement including subcutaneous tissue was performed using a tissue nipper and a chisel blade. Excisional debridement of all the necrotic nonviable tissue down to healthy bleeding viable tissue was performed with post-debridement measurements same as pre-. #3 the wound was cleansed with normal saline. #4 Continue home health dressing changes.  #5 Return to clinic in 4 weeks.      Edrick Kins, DPM [facility]  Dr. Edrick Kins, [place]                                        [place], [place] [address]                Office [phone]  Fax [phone]

## 2019-04-12 NOTE — Telephone Encounter (Addendum)
I called [name] - [facility] and asked if pt would need to be re certified and she states she is in another pt's chart and recommended [facility] main office be contacted [phone]. Faxed required form, clinicals and demographics with orders to continue the calcium alginate to right plantar foot and right 4th toe 3 times a week.

## 2019-04-12 NOTE — Telephone Encounter (Signed)
[facility] [place] [facility] states pt would need to be a new admit.

## 2019-04-14 ENCOUNTER — Telehealth: Payer: Self-pay | Admitting: *Deleted

## 2019-04-14 NOTE — Telephone Encounter (Signed)
Copied from [place] [id number]. Topic: General - Other >> Apr 13, 2019  5:10 PM Mcneil, Ja-Kwan wrote: Reason for CRM: [name] with [facility] called to advise that pt was not admitted for home health after being discharged from the hospital. [name] stated she contacted pt to schedule and when she informed her that she had to screened for Covid pt stated forget it and hung up. [name] stated they will not be going out due to pt refusal.Cb# [phone]

## 2019-04-14 NOTE — Telephone Encounter (Signed)
[facility] - [name] states they can not accept pt into their care, pt refused COVID test and hung the phone up on her.

## 2019-04-14 NOTE — Telephone Encounter (Signed)
FYI - pt has follow up appointment with you on 12/22.

## 2019-04-20 ENCOUNTER — Other Ambulatory Visit: Payer: Self-pay

## 2019-04-20 ENCOUNTER — Ambulatory Visit (INDEPENDENT_AMBULATORY_CARE_PROVIDER_SITE_OTHER): Payer: Medicare Other | Admitting: Family Medicine

## 2019-04-20 ENCOUNTER — Encounter: Payer: Self-pay | Admitting: Family Medicine

## 2019-04-20 VITALS — BP 136/80 | HR 83 | Resp 16 | Ht 67.0 in

## 2019-04-20 DIAGNOSIS — F419 Anxiety disorder, unspecified: Secondary | ICD-10-CM

## 2019-04-20 DIAGNOSIS — I639 Cerebral infarction, unspecified: Secondary | ICD-10-CM | POA: Diagnosis not present

## 2019-04-20 DIAGNOSIS — F22 Delusional disorders: Secondary | ICD-10-CM

## 2019-04-20 DIAGNOSIS — I872 Venous insufficiency (chronic) (peripheral): Secondary | ICD-10-CM | POA: Diagnosis not present

## 2019-04-20 DIAGNOSIS — E876 Hypokalemia: Secondary | ICD-10-CM

## 2019-04-20 DIAGNOSIS — E039 Hypothyroidism, unspecified: Secondary | ICD-10-CM | POA: Diagnosis not present

## 2019-04-20 NOTE — Progress Notes (Signed)
HPI:   [name] is a 85 y.o. female, who is here today to follow on recent ER visit.  She was last seen on 03/19/19.  States that she presented to the ER on 04/02/19 c/o worsening LE's dermatitis. Upset because she was sent to "the crazy place." She reported during ER visit that she spoke with the sheriff in regard to a woman (her dead husband's cousin's wife,[name]) coming to her house to spray poison on her legs.Also poisoning her food and toilet paper to causing diarrhea. "They want me dead."  She has not seeing her in her house but she know she is there. She thinks the ER provider is working with her husband's family. According to pt, provider in the ER knew she is going to leave everything to her church, "how did they know", "it is a scam."  States that she has been followed, she has noted a car following her and after 10 min another takes over, so they do not look suspicious.  Negative for fever,chills,changes in appetite.cough,wheezing,SOB,abdominal pain,N/V,or urinary symptoms. States that she was not hospitalized in the "crazy place" because she answered all the questions appropriately, 28 out of 30 questions. Upset because she had to go back to her house in a taxi.  CKD III: She has not noted gross hematuria,decreased urine output,or foam in urine.  Lab Results  Component Value Date   CREATININE 1.15 (H) 04/02/2019   BUN 15 04/02/2019   NA 143 04/02/2019   K 3.3 (L) 04/02/2019   CL 107  04/02/2019   CO2 26 04/02/2019   She is not sleeping well because afraid of being killed in her sleep.  Hypothyroidism: She is on Levothyroxine 50 mcg daily. Negative for palpitations,cold/heat intolerance.  Lab Results  Component Value Date   TSH 5.436 (H) 04/02/2019    Review of Systems  Constitutional: Positive for fatigue. Negative for activity change, appetite change and fever.  HENT: Negative for mouth sores and nosebleeds.   Eyes: Negative for redness  and visual disturbance.  Respiratory: Negative for cough and wheezing.   Cardiovascular: Positive for leg swelling. Negative for chest pain.  Gastrointestinal:       Negative for changes in bowel habits.  Genitourinary: Negative for decreased urine volume, dysuria and hematuria.  Musculoskeletal: Positive for arthralgias and gait problem.  Skin: Positive for rash and wound (Right ankle).  Neurological: Negative for syncope, weakness and headaches.  Psychiatric/Behavioral: Positive for sleep disturbance. Negative for hallucinations.  Rest see pertinent positives and negatives per HPI.   Current Outpatient Medications on File Prior to Visit  Medication Sig Dispense Refill  . Calcium Carbonate-Vitamin D (CALTRATE 600+D PO) Take 600 mg by mouth daily after breakfast.    . CREON 36000 units CPEP capsule Take 36,000 Units by mouth 3 (three) times daily before meals.     . furosemide (LASIX) 20 MG tablet Take 0.5 tablets (10 mg total) by mouth daily. 45 tablet 2  . Lactobacillus Rhamnosus, GG, (CULTURELLE PO) Take 1 capsule by mouth every morning.    . latanoprost (XALATAN) 0.005 % ophthalmic solution Place 1 drop into both eyes at bedtime.   10  . levothyroxine (SYNTHROID) 50 MCG tablet Take 1 tablet (50 mcg total) by mouth daily before breakfast. 90 tablet 2  . midodrine (PROAMATINE) 2.5 MG tablet Take 1 tablet (2.5 mg total) by mouth daily after breakfast. 90 tablet 2  . triamcinolone cream (KENALOG) 0.1 % Apply 1 application topically  2 (two) times daily. 45 g 1   No current facility-administered medications on file prior to visit.     Past Medical History:  Diagnosis Date  . Cataract   . Complete heart block (HCC)    s/p PPM implant (MDT) by Dr Blanch Media.  Atrial lead could not be paced at time of the procedure.  She has chronic AV dysociation  . Delusional disorder ([place])   . Exogenous obesity   . Hypercholesterolemia   . Invasive ductal carcinoma of breast ([place]) 03/2007   BILATERAL  BREASTS  . Orthostatic hypotension    treated with midodrine by Dr Rollene [name]  . PONV (postoperative nausea and vomiting)   . Thyroid disease   . Venous insufficiency    Allergies  Allergen Reactions  . Amoxicillin-Pot Clavulanate Diarrhea and Nausea And Vomiting    Has patient had a PCN reaction causing immediate rash, facial/tongue/throat swelling, SOB or lightheadedness with hypotension: Yes Has patient had a PCN reaction causing severe rash involving mucus membranes or skin necrosis: No Has patient had a PCN reaction that required hospitalization: No Has patient had a PCN reaction occurring within the last 10 years: Yes If all of the above answers are "NO", then may proceed with Cephalosporin use.   . Erythromycin Hives  . Tape Other (See Comments)    BAND AIDS-SKIN IRRITATION  . Codeine Nausea And Vomiting  . Flagyl [Metronidazole] Hives  . Macrobid [Nitrofurantoin Macrocrystal] Nausea And Vomiting  . Tolterodine Nausea And Vomiting  . Ciprofloxacin Hives and Rash    Social History   Socioeconomic History  . Marital status: Widowed    Spouse name: Not on file  . Number of children: Not on file  . Years of education: Not on file  . Highest education level: Not on file  Occupational History  . Not on file  Tobacco Use  . Smoking status: Never Smoker  . Smokeless tobacco: Never Used  Substance and Sexual Activity  . Alcohol use: No    Alcohol/week: 0.0 standard drinks  . Drug use: No  . Sexual activity: Not Currently  Other Topics Concern  . Not on file  Social History Narrative  . Not on file   Social Determinants of Health   Financial Resource Strain:   . Difficulty of Paying Living Expenses: Not on file  Food Insecurity:   . Worried About Charity fundraiser in the Last Year: Not on file  . Ran Out of Food in the Last Year: Not on file  Transportation Needs:   . Lack of Transportation (Medical): Not on file  . Lack of Transportation (Non-Medical): Not on  file  Physical Activity:   . Days of Exercise per Week: Not on file  . Minutes of Exercise per Session: Not on file  Stress:   . Feeling of Stress : Not on file  Social Connections:   . Frequency of Communication with Friends and Family: Not on file  . Frequency of Social Gatherings with Friends and Family: Not on file  . Attends Religious Services: Not on file  . Active Member of Clubs or Organizations: Not on file  . Attends Archivist Meetings: Not on file  . Marital Status: Not on file    Vitals:   04/20/19 1010  BP: 136/80  Pulse: 83  Resp: 16  SpO2: 100%   Body mass index is 39.16 kg/m.   Physical Exam  Nursing note and vitals reviewed. Constitutional: She is oriented to person, place,  and time. She appears well-developed. No distress.  HENT:  Head: Normocephalic and atraumatic.  Mouth/Throat: Oropharynx is clear and moist and mucous membranes are normal.  Eyes: Pupils are equal, round, and reactive to light. Conjunctivae are normal.  Cardiovascular: Normal rate and regular rhythm.  No murmur heard. Respiratory: Effort normal and breath sounds normal. No respiratory distress.  GI: Soft. There is no abdominal tenderness.  Musculoskeletal:        General: Edema (Trace pitting LE edema,bilateral. Lymphedema,bilateral) present.  Neurological: She is alert and oriented to person, place, and time. She has normal strength. No cranial nerve deficit.  Unstable gait assisted by cane.  Skin: Skin is warm. There is erythema.  Pretibial erythema, bilateral. Thick skin with irregular/rough surface.No exudate or tenderness. Wrap around right ankle  Psychiatric: Her mood appears anxious. Her affect is labile.  Well groomed, good eye contact.    ASSESSMENT AND PLAN:  Ms. Roseanne was seen today for hospitalization follow-up.  Diagnoses and all orders for this visit: Orders Placed This Encounter  Procedures  . Basic Metabolic Panel  . TSH    Hypothyroidism,  unspecified type Abnormal TSH. She refused going to the lab before leaving. Continue Levothyroxine 50 mg daily. Will plan on checking next visit.  Venous stasis dermatitis of both lower extremities Problem is not well controlled. I do not think abx is needed at this time. Adequate skin care. Continue topical Triamcinolone.  Anxiety disorder, unspecified type She is not interested in medication.  Hypokalemia K+ rich diet recommended for now. She refused blood work today.  Delusional disorder [facility]) Admission was not deemed necessary. Refused to establish with psychiatrist. She is not interested in treatment, she does not feel like she has a problem.  10:15 Am-11:06 am > 50% was dedicated to discussion of Dx's, prognosis, and some side effects of medications. She is not interested in taking something at night so she can sleep better.States that she does not want to a deep sleep because "she will kill me." Paranoid behavior, she is convince ER providers are working with her dead husband's cousin's wife, that she is being followed,and that somebody is trying to kill her.She wants me to write names incase something happens to her, the police will know who did it. She is not willing to move to a smaller place, "that is what they want." Apparently [facility] service was stopped. She states that she was tired of having COVID 19 screening. States that she know she doe snot have it but [place] aid may have it. She did not get alone with one of the aids. She is following with podiatrist for wound care.   Return in about 5 months (around 09/18/2019) for HTN,venous stasis dermatitis.   Ailey Wessling G. [place], MD  [facility]. [place] office.

## 2019-04-20 NOTE — Patient Instructions (Addendum)
A few things to remember from today's visit:   Hypothyroidism, unspecified type - Plan: TSH  Anxiety disorder, unspecified type  Venous stasis dermatitis of both lower extremities  Hypokalemia - Plan: Basic Metabolic Panel  No changes today. I wish you reconsider taking medication for your nerves. We will try to re-arrange home health service. Be careful with driving, I would like to try to arrange transportation.  I will see you back in May 2021.  Please be sure medication list is accurate. If a new problem present, please set up appointment sooner than planned today.

## 2019-04-26 ENCOUNTER — Ambulatory Visit (INDEPENDENT_AMBULATORY_CARE_PROVIDER_SITE_OTHER): Payer: Medicare Other | Admitting: *Deleted

## 2019-04-26 DIAGNOSIS — I442 Atrioventricular block, complete: Secondary | ICD-10-CM | POA: Diagnosis not present

## 2019-04-26 LAB — CUP PACEART REMOTE DEVICE CHECK
Battery Impedance: 1510 Ohm
Battery Remaining Longevity: 39 mo
Battery Voltage: 2.76 V
Brady Statistic RV Percent Paced: 99 %
Date Time Interrogation Session: 20201228085528
Implantable Lead Implant Date: 20141208
Implantable Lead Location: 753860
Implantable Lead Model: 4076
Implantable Pulse Generator Implant Date: 20161027
Lead Channel Impedance Value: 0 Ohm
Lead Channel Impedance Value: 374 Ohm
Lead Channel Pacing Threshold Amplitude: 1.25 V
Lead Channel Pacing Threshold Pulse Width: 0.4 ms
Lead Channel Setting Pacing Amplitude: 2.5 V
Lead Channel Setting Pacing Pulse Width: 0.46 ms
Lead Channel Setting Sensing Sensitivity: 2 mV

## 2019-05-05 ENCOUNTER — Ambulatory Visit (INDEPENDENT_AMBULATORY_CARE_PROVIDER_SITE_OTHER): Payer: Medicare Other | Admitting: Podiatry

## 2019-05-05 ENCOUNTER — Other Ambulatory Visit: Payer: Self-pay

## 2019-05-05 DIAGNOSIS — I872 Venous insufficiency (chronic) (peripheral): Secondary | ICD-10-CM | POA: Diagnosis not present

## 2019-05-05 DIAGNOSIS — L97512 Non-pressure chronic ulcer of other part of right foot with fat layer exposed: Secondary | ICD-10-CM | POA: Diagnosis not present

## 2019-05-05 MED ORDER — DOXYCYCLINE HYCLATE 100 MG PO TABS: 100.0000 mg | ORAL_TABLET | Freq: Two times a day (BID) | ORAL | 0 refills | Status: DC

## 2019-05-06 ENCOUNTER — Other Ambulatory Visit: Payer: Self-pay | Admitting: Podiatry

## 2019-05-06 ENCOUNTER — Telehealth: Payer: Self-pay | Admitting: *Deleted

## 2019-05-06 MED ORDER — SULFAMETHOXAZOLE-TRIMETHOPRIM 400-80 MG PO TABS: 1.0000 | ORAL_TABLET | Freq: Two times a day (BID) | ORAL | 0 refills | Status: DC

## 2019-05-06 NOTE — Telephone Encounter (Signed)
I sent bactrim to the pharmacy for her.   Dr. [name]- if you feel that it needs to be changed let me know.

## 2019-05-06 NOTE — Telephone Encounter (Signed)
Pt called states the medication Dr. [name] called in yesterday "runs through her like a dose of salts" and she would like it changed.

## 2019-05-07 NOTE — Telephone Encounter (Signed)
I informed pt of the new antibiotic orders. Pt states she has picked up the antibiotic of 05/05/2019 and will try it.

## 2019-05-09 NOTE — Progress Notes (Signed)
.     Subjective:  86 y.o. female presenting today for follow up evaluation of an ulceration of the right foot. She states the wound has worsened since the home health nurse stopped coming. She reports moderate swelling, drainage and malodor from the wound. She denies modifying factors and has not had any recent treatment. Patient is here for further evaluation and treatment.   Past Medical History:  Diagnosis Date  . Cataract   . Complete heart block (HCC)    s/p PPM implant (MDT) by Dr Blanch Media.  Atrial lead could not be paced at time of the procedure.  She has chronic AV dysociation  . Delusional disorder ([place])   . Exogenous obesity   . Hypercholesterolemia   . Invasive ductal carcinoma of breast ([place]) 03/2007   BILATERAL BREASTS  . Orthostatic hypotension    treated with midodrine by Dr Rollene [name]  . PONV (postoperative nausea and vomiting)   . Thyroid disease   . Venous insufficiency      Objective/Physical Exam General: The patient is alert and oriented x3 in no acute distress.  Dermatology:  Wound #1 noted to the right foot measuring 2.0 x 1.5 x 0.3 cm (LxWxD).   To the noted ulceration(s), there is no eschar. There is a moderate amount of slough, fibrin, and necrotic tissue noted. Granulation tissue and wound base is red. There is a minimal amount of serosanguineous drainage noted. There is no exposed bone muscle-tendon ligament or joint. There is no malodor. Periwound integrity is intact. Erythema and edema noted to the right lower extremity.   Vascular: Palpable pedal pulses bilaterally. Capillary refill within normal limits. Varicosities noted bilateral lower extremities.   Neurological: Epicritic and protective threshold diminished bilaterally.   Musculoskeletal Exam: Range of motion within normal limits to all pedal and ankle joints bilateral. Muscle strength 5/5 in all groups bilateral.   Assessment: #1 ulceration of the right foot secondary to venous  insufficiency #2 varicosities bilateral lower extremities #3 Cellulitis right leg - chronic   Plan of Care:  #1 Patient was evaluated.  #2 medically necessary excisional debridement including subcutaneous tissue was performed using a tissue nipper and a chisel blade. Excisional debridement of all the necrotic nonviable tissue down to healthy bleeding viable tissue was performed with post-debridement measurements same as pre-. #3 the wound was cleansed with normal saline. #4 Prescription for Doxycycline 100 mg #20 provided to patient.  #5 resume home health nurse care daily.  #6 Return to clinic in 4 weeks.       Edrick Kins, DPM [facility]  Dr. Edrick Kins, [address]                                        [place], [place] [address]                Office [phone]  Fax [phone]

## 2019-05-11 NOTE — Progress Notes (Signed)
[name] Date of Birth: 06-24-1927 Medical Record [id number]  History of Present Illness: [name] is seen today for follow up of complete heart block.  She has a history of complete heart block with 12 second pause and underwent permanent pacemaker implant on 10/09/2007 with a Medtronic adapta VVI pacemaker. She had revision of her pacemaker on February 23, 2015. She was admitted at that time with a GI bleed felt to be diverticular. No recurrence.  She also has a history of orthostatic dizziness managed with midodrine. She has no history of coronary disease or congestive heart failure. Last pacemaker check in December 2020 was normal.  She was admitted in June 2018 with acute diverticulitis. Treated with hydration and antibiotics. Admitted in November and December 2018 with hematochezia. Felt to be diverticular bleed. Conservative therapy recommended. ASA discontinued.   She was admitted in June 2020 with acute delusional disorder. Had been admitted involuntarily in [place] and released. Had CT scan that showed incidental finding of cerebellar hemorrhage that was managed conservatively. She has refused further psychiatric evaluation or treatment.   On follow up today she is doing well from a cardiac standpoint. She denies any dizziness, syncope, palpitations, chest pain or dyspnea. States she just sits and watches TV or reads.   Current Outpatient Medications on File Prior to Visit  Medication Sig Dispense Refill  . Calcium Carbonate-Vitamin D (CALTRATE 600+D PO) Take 600 mg by mouth daily after breakfast.    . furosemide (LASIX) 20 MG tablet Take 0.5 tablets (10 mg total) by mouth daily. 45 tablet 2  . Lactobacillus Rhamnosus, GG, (CULTURELLE PO) Take 1 capsule by mouth every morning.    . latanoprost (XALATAN) 0.005 % ophthalmic solution Place 1 drop into both eyes at bedtime.   10  . levothyroxine (SYNTHROID) 50 MCG tablet Take 1 tablet (50 mcg total) by mouth daily before  breakfast. 90 tablet 2  . midodrine (PROAMATINE) 2.5 MG tablet Take 1 tablet (2.5 mg total) by mouth daily after breakfast. 90 tablet 2  . sulfamethoxazole-trimethoprim (BACTRIM) 400-80 MG tablet Take 1 tablet by mouth 2 (two) times daily. 20 tablet 0   No current facility-administered medications on file prior to visit.    Allergies  Allergen Reactions  . Amoxicillin-Pot Clavulanate Diarrhea and Nausea And Vomiting    Has patient had a PCN reaction causing immediate rash, facial/tongue/throat swelling, SOB or lightheadedness with hypotension: Yes Has patient had a PCN reaction causing severe rash involving mucus membranes or skin necrosis: No Has patient had a PCN reaction that required hospitalization: No Has patient had a PCN reaction occurring within the last 10 years: Yes If all of the above answers are "NO", then may proceed with Cephalosporin use.   . Erythromycin Hives  . Tape Other (See Comments)    BAND AIDS-SKIN IRRITATION  . Codeine Nausea And Vomiting  . Doxycycline Diarrhea  . Flagyl [Metronidazole] Hives  . Macrobid [Nitrofurantoin Macrocrystal] Nausea And Vomiting  . Tolterodine Nausea And Vomiting  . Ciprofloxacin Hives and Rash    Past Medical History:  Diagnosis Date  . Cataract   . Complete heart block (HCC)    s/p PPM implant (MDT) by Dr Blanch Media.  Atrial lead could not be paced at time of the procedure.  She has chronic AV dysociation  . Delusional disorder ([place])   . Exogenous obesity   . Hypercholesterolemia   . Invasive ductal carcinoma of breast ([place]) 03/2007   BILATERAL BREASTS  . Orthostatic hypotension  treated with midodrine by Dr Rollene [name]  . PONV (postoperative nausea and vomiting)   . Thyroid disease   . Venous insufficiency     Past Surgical History:  Procedure Laterality Date  . ABDOMINAL HYSTERECTOMY    . BREAST LUMPECTOMY Bilateral 2009  . CATARACT EXTRACTION     EYE SURGERY X 2  . CHOLECYSTECTOMY    . COLONOSCOPY N/A 02/16/2015    Procedure: COLONOSCOPY;  Surgeon: Wilford Corner, MD;  [facility]COPY;  Service: Endoscopy;  Laterality: N/A;  . EP IMPLANTABLE DEVICE N/A 02/23/2015   pacemaker generator change (MDT Sensia SR) by Dr Rayann Heman   . ESOPHAGOGASTRODUODENOSCOPY N/A 02/16/2015   Procedure: ESOPHAGOGASTRODUODENOSCOPY (EGD);  Surgeon: Wilford Corner, MD;  Location: [facility] ENDOSCOPY;  Service: Endoscopy;  Laterality: N/A;  . PACEMAKER INSERTION     MDT implanted by Dr Blanch Media.  Atrial lead placement was unsuccessful.  She has chronic AV dysociation  . remote right radical nephrectomy  2009    Social History   Tobacco Use  Smoking Status Never Smoker  Smokeless Tobacco Never Used    Social History   Substance and Sexual Activity  Alcohol Use No  . Alcohol/week: 0.0 standard drinks    Family History  Problem Relation Age of Onset  . Heart disease Maternal Grandmother   . Heart disease Mother     Review of Systems: As noted in history of present illness.  All other systems were reviewed and are negative.  Physical Exam: BP (!) 143/86   Pulse 79   Temp 98.2 F (36.8 C)   Ht 5\' 7"  (1.702 m)   Wt 219 lb 9.6 oz (99.6 kg)   SpO2 97%   BMI 34.39 kg/m  GENERAL:  Well appearing overweight WF in NAD HEENT:  PERRL, EOMI, sclera are clear. Oropharynx is clear. NECK:  No jugular venous distention, carotid upstroke brisk and symmetric, no bruits, no thyromegaly or adenopathy LUNGS:  Clear to auscultation bilaterally CHEST:  Unremarkable HEART:  RRR,  PMI not displaced or sustained,S1 and S2 within normal limits, no S3, no S4: no clicks, no rubs, no murmurs ABD:  Soft, nontender. BS +, no masses or bruits. No hepatomegaly, no splenomegaly EXT:  2 + pulses throughout, right foot is wrapped. Left leg has an ACE wrap. Brawny skin changes.  SKIN:  Warm and dry.  No rashes NEURO:  Alert and oriented x 3. Cranial nerves II through XII intact. PSYCH:  Cognitively intact  LABORATORY DATA: Lab Results   Component Value Date   WBC 7.1 04/02/2019   HGB 13.3 04/02/2019   HCT 43.3 04/02/2019   PLT 250 04/02/2019   GLUCOSE 95 04/02/2019   CHOL 147 10/05/2018   TRIG 74 10/05/2018   HDL 45 10/05/2018   LDLCALC 87 10/05/2018   ALT 12 04/02/2019   AST 17 04/02/2019   NA 143 04/02/2019   K 3.3 (L) 04/02/2019   CL 107 04/02/2019   CREATININE 1.15 (H) 04/02/2019   BUN 15 04/02/2019   CO2 26 04/02/2019   TSH 5.436 (H) 04/02/2019   INR 0.96 03/30/2017   HGBA1C 5.8 (H) 10/05/2018   MICROALBUR 2.0 (H) 11/30/2018   Labs dated 04/12/16: cholesterol 178, triglycerides 101, LDL 94, HDL 64. BUN 18, creatinine 1.24. Other chemistries, TSH, CBC normal. Dated 01/24/17: HDL 62, triglycerides 83, A1c 5.9%.  Dated 08/28/17: cholesterol 185, triglycerides 81, HDL 75, LDL 94. A1c 6.1%.   . Assessment / Plan: 1. History complete heart block status post permanent VVIR pacemaker  placement in June of 2009. Revised 02/23/15. Pacer check Dec 2020 was normal. Clinically doing well. She is followed by Dr. Rayann Heman in the pacer clinic. I'll followup again in one year.  2. History of orthostatic hypotension-on low dose midodrine once a day. Asymptomatic.  3. Hyperlipidemia on statin therapy. Controlled.  4. History of recurrent diverticular bleed. Off ASA.  5. LE edema due to venous stasis. Chronic nonhealing wound on right ankle. Followed by podiatry  6. History of delusional disorder.

## 2019-05-18 ENCOUNTER — Ambulatory Visit (INDEPENDENT_AMBULATORY_CARE_PROVIDER_SITE_OTHER): Payer: Medicare Other | Admitting: Cardiology

## 2019-05-18 ENCOUNTER — Other Ambulatory Visit: Payer: Self-pay

## 2019-05-18 ENCOUNTER — Encounter: Payer: Self-pay | Admitting: Cardiology

## 2019-05-18 VITALS — BP 143/86 | HR 79 | Temp 98.2°F | Ht 67.0 in | Wt 219.6 lb

## 2019-05-18 DIAGNOSIS — Z950 Presence of cardiac pacemaker: Secondary | ICD-10-CM

## 2019-05-18 DIAGNOSIS — I951 Orthostatic hypotension: Secondary | ICD-10-CM | POA: Diagnosis not present

## 2019-05-18 DIAGNOSIS — I442 Atrioventricular block, complete: Secondary | ICD-10-CM

## 2019-06-02 ENCOUNTER — Ambulatory Visit (INDEPENDENT_AMBULATORY_CARE_PROVIDER_SITE_OTHER): Payer: Medicare Other | Admitting: Podiatry

## 2019-06-02 ENCOUNTER — Other Ambulatory Visit: Payer: Self-pay

## 2019-06-02 DIAGNOSIS — I872 Venous insufficiency (chronic) (peripheral): Secondary | ICD-10-CM

## 2019-06-02 DIAGNOSIS — L97512 Non-pressure chronic ulcer of other part of right foot with fat layer exposed: Secondary | ICD-10-CM | POA: Diagnosis not present

## 2019-06-02 DIAGNOSIS — L03119 Cellulitis of unspecified part of limb: Secondary | ICD-10-CM

## 2019-06-04 NOTE — Progress Notes (Signed)
Patient scheduled 09/17/2019 at 12

## 2019-06-06 NOTE — Progress Notes (Signed)
.     Subjective:  86 y.o. female presenting today for follow up evaluation of an ulceration of the right foot. She states the wound looks like it is worsening. She states home health is no longer coming to change her bandages and the last time it was changed was at the last visit here on 05/05/2019. She denies any pain or modifying factors. Patient is here for further evaluation and treatment.   Past Medical History:  Diagnosis Date  . Cataract   . Complete heart block (HCC)    s/p PPM implant (MDT) by Dr Blanch Media.  Atrial lead could not be paced at time of the procedure.  She has chronic AV dysociation  . Delusional disorder ([place])   . Exogenous obesity   . Hypercholesterolemia   . Invasive ductal carcinoma of breast ([place]) 03/2007   BILATERAL BREASTS  . Orthostatic hypotension    treated with midodrine by Dr Rollene [name]  . PONV (postoperative nausea and vomiting)   . Thyroid disease   . Venous insufficiency      Objective/Physical Exam General: The patient is alert and oriented x3 in no acute distress.  Dermatology:  Wound #1 noted to the right foot measuring 1.5 x 1.0 x 0.2 cm (LxWxD).   To the noted ulceration(s), there is no eschar. There is a moderate amount of slough, fibrin, and necrotic tissue noted. Granulation tissue and wound base is red. There is a minimal amount of serosanguineous drainage noted. There is no exposed bone muscle-tendon ligament or joint. There is no malodor. Periwound integrity is intact. Erythema and edema noted to the right lower extremity.   Vascular: Palpable pedal pulses bilaterally. Capillary refill within normal limits. Varicosities noted bilateral lower extremities.   Neurological: Epicritic and protective threshold diminished bilaterally.   Musculoskeletal Exam: Range of motion within normal limits to all pedal and ankle joints bilateral. Muscle strength 5/5 in all groups bilateral.   Assessment: #1 ulceration of the right foot secondary to  venous insufficiency #2 varicosities bilateral lower extremities #3 Cellulitis right leg - improved   Plan of Care:  #1 Patient was evaluated.  #2 medically necessary excisional debridement including subcutaneous tissue was performed using a tissue nipper and a chisel blade. Excisional debridement of all the necrotic nonviable tissue down to healthy bleeding viable tissue was performed with post-debridement measurements same as pre-. #3 the wound was cleansed with normal saline. #4 Dry sterile dressing applied.   #5 resume home health nurse care daily.  #6 Return to clinic in 3 weeks.       Edrick Kins, DPM [facility]  Dr. Edrick Kins, [address]                                        [place], [place] [address]                Office [phone]  Fax [phone]

## 2019-06-10 ENCOUNTER — Telehealth: Payer: Self-pay | Admitting: Family Medicine

## 2019-06-10 DIAGNOSIS — I872 Venous insufficiency (chronic) (peripheral): Secondary | ICD-10-CM

## 2019-06-10 NOTE — Telephone Encounter (Signed)
Medication Refill: Midodrine and Diuretic   Pharmacy: [place] Phone: [phone]

## 2019-06-11 MED ORDER — FUROSEMIDE 20 MG PO TABS: 10.0000 mg | ORAL_TABLET | Freq: Every day | ORAL | 2 refills | Status: DC

## 2019-06-11 MED ORDER — MIDODRINE HCL 2.5 MG PO TABS: 2.5000 mg | ORAL_TABLET | Freq: Every day | ORAL | 2 refills | Status: DC

## 2019-06-11 NOTE — Telephone Encounter (Signed)
Rx's sent in. °

## 2019-06-22 DIAGNOSIS — H43811 Vitreous degeneration, right eye: Secondary | ICD-10-CM | POA: Diagnosis not present

## 2019-06-22 DIAGNOSIS — Z961 Presence of intraocular lens: Secondary | ICD-10-CM | POA: Diagnosis not present

## 2019-06-22 DIAGNOSIS — H401132 Primary open-angle glaucoma, bilateral, moderate stage: Secondary | ICD-10-CM | POA: Diagnosis not present

## 2019-06-23 ENCOUNTER — Ambulatory Visit (INDEPENDENT_AMBULATORY_CARE_PROVIDER_SITE_OTHER): Payer: Medicare Other | Admitting: Podiatry

## 2019-06-23 ENCOUNTER — Encounter: Payer: Self-pay | Admitting: Podiatry

## 2019-06-23 ENCOUNTER — Other Ambulatory Visit: Payer: Self-pay

## 2019-06-23 DIAGNOSIS — L97512 Non-pressure chronic ulcer of other part of right foot with fat layer exposed: Secondary | ICD-10-CM | POA: Diagnosis not present

## 2019-06-23 DIAGNOSIS — I872 Venous insufficiency (chronic) (peripheral): Secondary | ICD-10-CM

## 2019-07-05 ENCOUNTER — Telehealth: Payer: Self-pay | Admitting: Family Medicine

## 2019-07-05 NOTE — Telephone Encounter (Signed)
Pt would like a call to her insurance so they will send her medication. [phone]. They told patient they are needing information from her provider.

## 2019-07-05 NOTE — Telephone Encounter (Signed)
Tried contacting pt - pharmacy has filled all of her Rx's and they have been shipped to her home address at this time.

## 2019-07-05 NOTE — Telephone Encounter (Signed)
Tried [facility], they are currently closed. Will try again later.

## 2019-07-06 NOTE — Progress Notes (Signed)
.     Subjective:  86 y.o. female presenting today for follow up evaluation of an ulceration of the right foot. Patient states that her right foot is doing about the same. She is a little concerned about the healing process. She states that when she had home health nurse dressing changes and wound care at home the wound was significantly improved. Apparently that was discontinued and she would like to see if we could resume home health dressing changes. She presents for further treatment and evaluation  Past Medical History:  Diagnosis Date  . Cataract   . Complete heart block (HCC)    s/p PPM implant (MDT) by Dr Blanch Media.  Atrial lead could not be paced at time of the procedure.  She has chronic AV dysociation  . Delusional disorder ([place])   . Exogenous obesity   . Hypercholesterolemia   . Invasive ductal carcinoma of breast ([place]) 03/2007   BILATERAL BREASTS  . Orthostatic hypotension    treated with midodrine by Dr Rollene [name]  . PONV (postoperative nausea and vomiting)   . Thyroid disease   . Venous insufficiency      Objective/Physical Exam General: The patient is alert and oriented x3 in no acute distress.  Dermatology:  Wound #1 noted to the right foot measuring 1.5 x 1.0 x 0.2 cm (LxWxD).   To the noted ulceration(s), there is no eschar. There is a moderate amount of slough, fibrin, and necrotic tissue noted. Granulation tissue and wound base is red. There is a minimal amount of serosanguineous drainage noted. There is no exposed bone muscle-tendon ligament or joint. There is no malodor. Periwound integrity is intact. Erythema and edema noted to the right lower extremity.   Vascular: Palpable pedal pulses bilaterally. Capillary refill within normal limits. Varicosities noted bilateral lower extremities.   Neurological: Epicritic and protective threshold diminished bilaterally.   Musculoskeletal Exam: Range of motion within normal limits to all pedal and ankle joints bilateral.  Muscle strength 5/5 in all groups bilateral.   Assessment: #1 ulceration of the right foot secondary to venous insufficiency #2 varicosities bilateral lower extremities #3 Cellulitis right leg -resolved  Plan of Care:  #1 Patient was evaluated.  #2 medically necessary excisional debridement including subcutaneous tissue was performed using a tissue nipper and a chisel blade. Excisional debridement of all the necrotic nonviable tissue down to healthy bleeding viable tissue was performed with post-debridement measurements same as pre-. #3 the wound was cleansed with normal saline. #4 Dry sterile dressing applied.   #5 Recommend Aquacel Ag and dry sterile dressing daily #6 today we will see if we can reinitiate home health dressing change orders weekly. #7 return to clinic in 4 weeks      Edrick Kins, DPM [facility]  Dr. Edrick Kins, [address]                                        [place], [place] [address]                Office [phone]  Fax [phone]

## 2019-07-07 ENCOUNTER — Telehealth: Payer: Self-pay | Admitting: *Deleted

## 2019-07-07 NOTE — Telephone Encounter (Signed)
-----   Message from Edrick Kins, DPM sent at 07/06/2019  3:21 PM EST ----- Regarding: Home health dressing change orders Hi Herald Vallin,   Apparently this patient had home health wound care, however a few months ago it was discontinued. I do not believe this order was originally placed by [place], I think it was from her PCP.  Could we order home health dressing change wound care orders and reinitiate home health wound care?  -Diagnosis: Ulcer of the right plantar midfoot secondary to venous insufficiency. 1.5 x 1.0 x 0.2 cm  -Cleanse wound with normal saline. Apply Aquacel Ag. Dressed with dry sterile dressing. 2 times per week x8 weeks  Thanks, Dr. [name]

## 2019-07-07 NOTE — Telephone Encounter (Signed)
[name] states pt was discharged at pt's request, due to mental health issues pt refuses to allow clinicians/nurses in home.

## 2019-07-08 NOTE — Telephone Encounter (Signed)
No. For the past few months patient has asked that I re-request nurses to come to the home. She is open to it now. - Dr. [name]

## 2019-07-09 NOTE — Telephone Encounter (Signed)
I spoke with [facility] reception staff and they are accepting wound care pt's with [employer], with care beginning the 1st of next week.

## 2019-07-09 NOTE — Telephone Encounter (Signed)
[name] transferred to [name] states she spoke with their branch director and they will not accept her back into their care.

## 2019-07-11 DIAGNOSIS — Z950 Presence of cardiac pacemaker: Secondary | ICD-10-CM | POA: Diagnosis not present

## 2019-07-11 DIAGNOSIS — M8580 Other specified disorders of bone density and structure, unspecified site: Secondary | ICD-10-CM | POA: Diagnosis not present

## 2019-07-11 DIAGNOSIS — I872 Venous insufficiency (chronic) (peripheral): Secondary | ICD-10-CM | POA: Diagnosis not present

## 2019-07-11 DIAGNOSIS — E039 Hypothyroidism, unspecified: Secondary | ICD-10-CM | POA: Diagnosis not present

## 2019-07-11 DIAGNOSIS — I442 Atrioventricular block, complete: Secondary | ICD-10-CM | POA: Diagnosis not present

## 2019-07-11 DIAGNOSIS — Z853 Personal history of malignant neoplasm of breast: Secondary | ICD-10-CM | POA: Diagnosis not present

## 2019-07-11 DIAGNOSIS — I8390 Asymptomatic varicose veins of unspecified lower extremity: Secondary | ICD-10-CM | POA: Diagnosis not present

## 2019-07-11 DIAGNOSIS — E785 Hyperlipidemia, unspecified: Secondary | ICD-10-CM | POA: Diagnosis not present

## 2019-07-11 DIAGNOSIS — L97412 Non-pressure chronic ulcer of right heel and midfoot with fat layer exposed: Secondary | ICD-10-CM | POA: Diagnosis not present

## 2019-07-16 DIAGNOSIS — E785 Hyperlipidemia, unspecified: Secondary | ICD-10-CM | POA: Diagnosis not present

## 2019-07-16 DIAGNOSIS — E039 Hypothyroidism, unspecified: Secondary | ICD-10-CM | POA: Diagnosis not present

## 2019-07-16 DIAGNOSIS — I872 Venous insufficiency (chronic) (peripheral): Secondary | ICD-10-CM | POA: Diagnosis not present

## 2019-07-16 DIAGNOSIS — L97412 Non-pressure chronic ulcer of right heel and midfoot with fat layer exposed: Secondary | ICD-10-CM | POA: Diagnosis not present

## 2019-07-16 DIAGNOSIS — M8580 Other specified disorders of bone density and structure, unspecified site: Secondary | ICD-10-CM | POA: Diagnosis not present

## 2019-07-16 DIAGNOSIS — I8390 Asymptomatic varicose veins of unspecified lower extremity: Secondary | ICD-10-CM | POA: Diagnosis not present

## 2019-07-20 DIAGNOSIS — E785 Hyperlipidemia, unspecified: Secondary | ICD-10-CM | POA: Diagnosis not present

## 2019-07-20 DIAGNOSIS — M8580 Other specified disorders of bone density and structure, unspecified site: Secondary | ICD-10-CM | POA: Diagnosis not present

## 2019-07-20 DIAGNOSIS — E039 Hypothyroidism, unspecified: Secondary | ICD-10-CM | POA: Diagnosis not present

## 2019-07-20 DIAGNOSIS — I8390 Asymptomatic varicose veins of unspecified lower extremity: Secondary | ICD-10-CM | POA: Diagnosis not present

## 2019-07-20 DIAGNOSIS — L97412 Non-pressure chronic ulcer of right heel and midfoot with fat layer exposed: Secondary | ICD-10-CM | POA: Diagnosis not present

## 2019-07-20 DIAGNOSIS — I872 Venous insufficiency (chronic) (peripheral): Secondary | ICD-10-CM | POA: Diagnosis not present

## 2019-07-21 ENCOUNTER — Ambulatory Visit (INDEPENDENT_AMBULATORY_CARE_PROVIDER_SITE_OTHER): Payer: Medicare Other | Admitting: Podiatry

## 2019-07-21 ENCOUNTER — Other Ambulatory Visit: Payer: Self-pay

## 2019-07-21 DIAGNOSIS — L97512 Non-pressure chronic ulcer of other part of right foot with fat layer exposed: Secondary | ICD-10-CM | POA: Diagnosis not present

## 2019-07-21 DIAGNOSIS — I872 Venous insufficiency (chronic) (peripheral): Secondary | ICD-10-CM

## 2019-07-22 DIAGNOSIS — L97412 Non-pressure chronic ulcer of right heel and midfoot with fat layer exposed: Secondary | ICD-10-CM | POA: Diagnosis not present

## 2019-07-22 DIAGNOSIS — I872 Venous insufficiency (chronic) (peripheral): Secondary | ICD-10-CM | POA: Diagnosis not present

## 2019-07-22 DIAGNOSIS — E785 Hyperlipidemia, unspecified: Secondary | ICD-10-CM | POA: Diagnosis not present

## 2019-07-22 DIAGNOSIS — E039 Hypothyroidism, unspecified: Secondary | ICD-10-CM | POA: Diagnosis not present

## 2019-07-22 DIAGNOSIS — I8390 Asymptomatic varicose veins of unspecified lower extremity: Secondary | ICD-10-CM | POA: Diagnosis not present

## 2019-07-22 DIAGNOSIS — M8580 Other specified disorders of bone density and structure, unspecified site: Secondary | ICD-10-CM | POA: Diagnosis not present

## 2019-07-25 NOTE — Progress Notes (Signed)
.     Subjective:  86 y.o. female presenting today for follow up evaluation of an ulceration of the right foot. She reports some improvement of the wound. She states a home health nurse comes to change her dressings for her. She denies any modifying factors at this time. Patient is here for further evaluation and treatment.   Past Medical History:  Diagnosis Date  . Cataract   . Complete heart block (HCC)    s/p PPM implant (MDT) by Dr Blanch Media.  Atrial lead could not be paced at time of the procedure.  She has chronic AV dysociation  . Delusional disorder ([place])   . Exogenous obesity   . Hypercholesterolemia   . Invasive ductal carcinoma of breast ([place]) 03/2007   BILATERAL BREASTS  . Orthostatic hypotension    treated with midodrine by Dr Rollene [name]  . PONV (postoperative nausea and vomiting)   . Thyroid disease   . Venous insufficiency      Objective/Physical Exam General: The patient is alert and oriented x3 in no acute distress.  Dermatology:  Wound #1 noted to the right foot measuring 1.5 x 1.0 x 0.2 cm (LxWxD).   To the noted ulceration(s), there is no eschar. There is a moderate amount of slough, fibrin, and necrotic tissue noted. Granulation tissue and wound base is red. There is a minimal amount of serosanguineous drainage noted. There is no exposed bone muscle-tendon ligament or joint. There is no malodor. Periwound integrity is intact. Erythema and edema noted to the right lower extremity.   Vascular: Palpable pedal pulses bilaterally. Capillary refill within normal limits. Varicosities noted bilateral lower extremities.   Neurological: Epicritic and protective threshold diminished bilaterally.   Musculoskeletal Exam: Range of motion within normal limits to all pedal and ankle joints bilateral. Muscle strength 5/5 in all groups bilateral.   Assessment: #1 ulceration of the right foot secondary to venous insufficiency #2 varicosities bilateral lower extremities   Plan  of Care:  #1 Patient was evaluated.  #2 medically necessary excisional debridement including subcutaneous tissue was performed using a tissue nipper and a chisel blade. Excisional debridement of all the necrotic nonviable tissue down to healthy bleeding viable tissue was performed with post-debridement measurements same as pre-. #3 the wound was cleansed with normal saline. #4 Dry sterile dressing applied.   #5 Continue home health dressings at home.  #6 Return to clinic in 4 weeks.      Edrick Kins, DPM [facility]  Dr. Edrick Kins, [address]                                        [place], [place] [address]                Office [phone]  Fax [phone]

## 2019-07-26 ENCOUNTER — Ambulatory Visit (INDEPENDENT_AMBULATORY_CARE_PROVIDER_SITE_OTHER): Payer: Medicare Other | Admitting: *Deleted

## 2019-07-26 DIAGNOSIS — I442 Atrioventricular block, complete: Secondary | ICD-10-CM

## 2019-07-27 DIAGNOSIS — L97412 Non-pressure chronic ulcer of right heel and midfoot with fat layer exposed: Secondary | ICD-10-CM | POA: Diagnosis not present

## 2019-07-27 DIAGNOSIS — I872 Venous insufficiency (chronic) (peripheral): Secondary | ICD-10-CM | POA: Diagnosis not present

## 2019-07-27 DIAGNOSIS — M8580 Other specified disorders of bone density and structure, unspecified site: Secondary | ICD-10-CM | POA: Diagnosis not present

## 2019-07-27 DIAGNOSIS — I8390 Asymptomatic varicose veins of unspecified lower extremity: Secondary | ICD-10-CM | POA: Diagnosis not present

## 2019-07-27 DIAGNOSIS — E039 Hypothyroidism, unspecified: Secondary | ICD-10-CM | POA: Diagnosis not present

## 2019-07-27 DIAGNOSIS — E785 Hyperlipidemia, unspecified: Secondary | ICD-10-CM | POA: Diagnosis not present

## 2019-07-27 LAB — CUP PACEART REMOTE DEVICE CHECK
Battery Impedance: 1652 Ohm
Battery Remaining Longevity: 43 mo
Battery Voltage: 2.76 V
Brady Statistic RV Percent Paced: 99 %
Date Time Interrogation Session: 20210329074733
Implantable Lead Implant Date: 20141208
Implantable Lead Location: 753860
Implantable Lead Model: 4076
Implantable Pulse Generator Implant Date: 20161027
Lead Channel Impedance Value: 0 Ohm
Lead Channel Impedance Value: 402 Ohm
Lead Channel Pacing Threshold Amplitude: 1.125 V
Lead Channel Pacing Threshold Pulse Width: 0.4 ms
Lead Channel Setting Pacing Amplitude: 2.25 V
Lead Channel Setting Pacing Pulse Width: 0.4 ms
Lead Channel Setting Sensing Sensitivity: 2 mV

## 2019-07-27 NOTE — Progress Notes (Signed)
PPM Remote  

## 2019-07-29 DIAGNOSIS — E039 Hypothyroidism, unspecified: Secondary | ICD-10-CM | POA: Diagnosis not present

## 2019-07-29 DIAGNOSIS — I872 Venous insufficiency (chronic) (peripheral): Secondary | ICD-10-CM | POA: Diagnosis not present

## 2019-07-29 DIAGNOSIS — M8580 Other specified disorders of bone density and structure, unspecified site: Secondary | ICD-10-CM | POA: Diagnosis not present

## 2019-07-29 DIAGNOSIS — L97412 Non-pressure chronic ulcer of right heel and midfoot with fat layer exposed: Secondary | ICD-10-CM | POA: Diagnosis not present

## 2019-07-29 DIAGNOSIS — I8390 Asymptomatic varicose veins of unspecified lower extremity: Secondary | ICD-10-CM | POA: Diagnosis not present

## 2019-07-29 DIAGNOSIS — E785 Hyperlipidemia, unspecified: Secondary | ICD-10-CM | POA: Diagnosis not present

## 2019-08-03 DIAGNOSIS — M8580 Other specified disorders of bone density and structure, unspecified site: Secondary | ICD-10-CM | POA: Diagnosis not present

## 2019-08-03 DIAGNOSIS — I8390 Asymptomatic varicose veins of unspecified lower extremity: Secondary | ICD-10-CM | POA: Diagnosis not present

## 2019-08-03 DIAGNOSIS — E785 Hyperlipidemia, unspecified: Secondary | ICD-10-CM | POA: Diagnosis not present

## 2019-08-03 DIAGNOSIS — E039 Hypothyroidism, unspecified: Secondary | ICD-10-CM | POA: Diagnosis not present

## 2019-08-03 DIAGNOSIS — L97412 Non-pressure chronic ulcer of right heel and midfoot with fat layer exposed: Secondary | ICD-10-CM | POA: Diagnosis not present

## 2019-08-03 DIAGNOSIS — I872 Venous insufficiency (chronic) (peripheral): Secondary | ICD-10-CM | POA: Diagnosis not present

## 2019-08-05 DIAGNOSIS — I8390 Asymptomatic varicose veins of unspecified lower extremity: Secondary | ICD-10-CM | POA: Diagnosis not present

## 2019-08-05 DIAGNOSIS — L97412 Non-pressure chronic ulcer of right heel and midfoot with fat layer exposed: Secondary | ICD-10-CM | POA: Diagnosis not present

## 2019-08-05 DIAGNOSIS — E785 Hyperlipidemia, unspecified: Secondary | ICD-10-CM | POA: Diagnosis not present

## 2019-08-05 DIAGNOSIS — I872 Venous insufficiency (chronic) (peripheral): Secondary | ICD-10-CM | POA: Diagnosis not present

## 2019-08-05 DIAGNOSIS — M8580 Other specified disorders of bone density and structure, unspecified site: Secondary | ICD-10-CM | POA: Diagnosis not present

## 2019-08-05 DIAGNOSIS — E039 Hypothyroidism, unspecified: Secondary | ICD-10-CM | POA: Diagnosis not present

## 2019-08-10 DIAGNOSIS — M8580 Other specified disorders of bone density and structure, unspecified site: Secondary | ICD-10-CM | POA: Diagnosis not present

## 2019-08-10 DIAGNOSIS — I8390 Asymptomatic varicose veins of unspecified lower extremity: Secondary | ICD-10-CM | POA: Diagnosis not present

## 2019-08-10 DIAGNOSIS — Z950 Presence of cardiac pacemaker: Secondary | ICD-10-CM | POA: Diagnosis not present

## 2019-08-10 DIAGNOSIS — E785 Hyperlipidemia, unspecified: Secondary | ICD-10-CM | POA: Diagnosis not present

## 2019-08-10 DIAGNOSIS — L97412 Non-pressure chronic ulcer of right heel and midfoot with fat layer exposed: Secondary | ICD-10-CM | POA: Diagnosis not present

## 2019-08-10 DIAGNOSIS — I872 Venous insufficiency (chronic) (peripheral): Secondary | ICD-10-CM | POA: Diagnosis not present

## 2019-08-10 DIAGNOSIS — Z853 Personal history of malignant neoplasm of breast: Secondary | ICD-10-CM | POA: Diagnosis not present

## 2019-08-10 DIAGNOSIS — E039 Hypothyroidism, unspecified: Secondary | ICD-10-CM | POA: Diagnosis not present

## 2019-08-10 DIAGNOSIS — I442 Atrioventricular block, complete: Secondary | ICD-10-CM | POA: Diagnosis not present

## 2019-08-12 DIAGNOSIS — L97412 Non-pressure chronic ulcer of right heel and midfoot with fat layer exposed: Secondary | ICD-10-CM | POA: Diagnosis not present

## 2019-08-12 DIAGNOSIS — I8390 Asymptomatic varicose veins of unspecified lower extremity: Secondary | ICD-10-CM | POA: Diagnosis not present

## 2019-08-12 DIAGNOSIS — M8580 Other specified disorders of bone density and structure, unspecified site: Secondary | ICD-10-CM | POA: Diagnosis not present

## 2019-08-12 DIAGNOSIS — E039 Hypothyroidism, unspecified: Secondary | ICD-10-CM | POA: Diagnosis not present

## 2019-08-12 DIAGNOSIS — I872 Venous insufficiency (chronic) (peripheral): Secondary | ICD-10-CM | POA: Diagnosis not present

## 2019-08-12 DIAGNOSIS — E785 Hyperlipidemia, unspecified: Secondary | ICD-10-CM | POA: Diagnosis not present

## 2019-08-17 DIAGNOSIS — E039 Hypothyroidism, unspecified: Secondary | ICD-10-CM | POA: Diagnosis not present

## 2019-08-17 DIAGNOSIS — I872 Venous insufficiency (chronic) (peripheral): Secondary | ICD-10-CM | POA: Diagnosis not present

## 2019-08-17 DIAGNOSIS — E785 Hyperlipidemia, unspecified: Secondary | ICD-10-CM | POA: Diagnosis not present

## 2019-08-17 DIAGNOSIS — L97412 Non-pressure chronic ulcer of right heel and midfoot with fat layer exposed: Secondary | ICD-10-CM | POA: Diagnosis not present

## 2019-08-17 DIAGNOSIS — I8390 Asymptomatic varicose veins of unspecified lower extremity: Secondary | ICD-10-CM | POA: Diagnosis not present

## 2019-08-17 DIAGNOSIS — M8580 Other specified disorders of bone density and structure, unspecified site: Secondary | ICD-10-CM | POA: Diagnosis not present

## 2019-08-18 ENCOUNTER — Encounter: Payer: Self-pay | Admitting: Podiatry

## 2019-08-18 ENCOUNTER — Ambulatory Visit (INDEPENDENT_AMBULATORY_CARE_PROVIDER_SITE_OTHER): Payer: Medicare Other | Admitting: Podiatry

## 2019-08-18 ENCOUNTER — Other Ambulatory Visit: Payer: Self-pay

## 2019-08-18 DIAGNOSIS — I872 Venous insufficiency (chronic) (peripheral): Secondary | ICD-10-CM | POA: Diagnosis not present

## 2019-08-18 DIAGNOSIS — L97512 Non-pressure chronic ulcer of other part of right foot with fat layer exposed: Secondary | ICD-10-CM

## 2019-08-19 DIAGNOSIS — E039 Hypothyroidism, unspecified: Secondary | ICD-10-CM | POA: Diagnosis not present

## 2019-08-19 DIAGNOSIS — E785 Hyperlipidemia, unspecified: Secondary | ICD-10-CM | POA: Diagnosis not present

## 2019-08-19 DIAGNOSIS — L97412 Non-pressure chronic ulcer of right heel and midfoot with fat layer exposed: Secondary | ICD-10-CM | POA: Diagnosis not present

## 2019-08-19 DIAGNOSIS — I8390 Asymptomatic varicose veins of unspecified lower extremity: Secondary | ICD-10-CM | POA: Diagnosis not present

## 2019-08-19 DIAGNOSIS — I872 Venous insufficiency (chronic) (peripheral): Secondary | ICD-10-CM | POA: Diagnosis not present

## 2019-08-19 DIAGNOSIS — M8580 Other specified disorders of bone density and structure, unspecified site: Secondary | ICD-10-CM | POA: Diagnosis not present

## 2019-08-23 NOTE — Progress Notes (Signed)
.     Subjective:  86 y.o. female presenting today for follow up evaluation of an ulceration of the right foot. She states she is doing well. Her dressings are being changed by home health. She denies any worsening factors. Patient is here for further evaluation and treatment.   Past Medical History:  Diagnosis Date  . Cataract   . Complete heart block (HCC)    s/p PPM implant (MDT) by Dr Blanch Media.  Atrial lead could not be paced at time of the procedure.  She has chronic AV dysociation  . Delusional disorder ([place])   . Exogenous obesity   . Hypercholesterolemia   . Invasive ductal carcinoma of breast ([place]) 03/2007   BILATERAL BREASTS  . Orthostatic hypotension    treated with midodrine by Dr Rollene [name]  . PONV (postoperative nausea and vomiting)   . Thyroid disease   . Venous insufficiency      Objective/Physical Exam General: The patient is alert and oriented x3 in no acute distress.  Dermatology:  Wound #1 noted to the right foot measuring 1.0 x 1.0 x 0.3 cm (LxWxD).   To the noted ulceration(s), there is no eschar. There is a moderate amount of slough, fibrin, and necrotic tissue noted. Granulation tissue and wound base is red. There is a minimal amount of serosanguineous drainage noted. There is no exposed bone muscle-tendon ligament or joint. There is no malodor. Periwound integrity is intact. Erythema and edema noted to the right lower extremity.   Vascular: Palpable pedal pulses bilaterally. Capillary refill within normal limits. Varicosities noted bilateral lower extremities.   Neurological: Epicritic and protective threshold diminished bilaterally.   Musculoskeletal Exam: Range of motion within normal limits to all pedal and ankle joints bilateral. Muscle strength 5/5 in all groups bilateral.   Assessment: #1 ulceration of the right foot secondary to venous insufficiency #2 varicosities bilateral lower extremities   Plan of Care:  #1 Patient was evaluated.  #2  medically necessary excisional debridement including subcutaneous tissue was performed using a tissue nipper and a chisel blade. Excisional debridement of all the necrotic nonviable tissue down to healthy bleeding viable tissue was performed with post-debridement measurements same as pre-. #3 the wound was cleansed with normal saline. #4 Dry sterile dressing applied.   #5 Continue home health dressings at home.  #6 Return to clinic in 4 weeks.      Edrick Kins, DPM [facility]  Dr. Edrick Kins, [address]                                        [place], [place] [address]                Office [phone]  Fax [phone]

## 2019-08-24 DIAGNOSIS — L97412 Non-pressure chronic ulcer of right heel and midfoot with fat layer exposed: Secondary | ICD-10-CM | POA: Diagnosis not present

## 2019-08-24 DIAGNOSIS — I872 Venous insufficiency (chronic) (peripheral): Secondary | ICD-10-CM | POA: Diagnosis not present

## 2019-08-24 DIAGNOSIS — I8390 Asymptomatic varicose veins of unspecified lower extremity: Secondary | ICD-10-CM | POA: Diagnosis not present

## 2019-08-24 DIAGNOSIS — E785 Hyperlipidemia, unspecified: Secondary | ICD-10-CM | POA: Diagnosis not present

## 2019-08-24 DIAGNOSIS — M8580 Other specified disorders of bone density and structure, unspecified site: Secondary | ICD-10-CM | POA: Diagnosis not present

## 2019-08-24 DIAGNOSIS — E039 Hypothyroidism, unspecified: Secondary | ICD-10-CM | POA: Diagnosis not present

## 2019-08-26 DIAGNOSIS — I872 Venous insufficiency (chronic) (peripheral): Secondary | ICD-10-CM | POA: Diagnosis not present

## 2019-08-26 DIAGNOSIS — E785 Hyperlipidemia, unspecified: Secondary | ICD-10-CM | POA: Diagnosis not present

## 2019-08-26 DIAGNOSIS — I8390 Asymptomatic varicose veins of unspecified lower extremity: Secondary | ICD-10-CM | POA: Diagnosis not present

## 2019-08-26 DIAGNOSIS — E039 Hypothyroidism, unspecified: Secondary | ICD-10-CM | POA: Diagnosis not present

## 2019-08-26 DIAGNOSIS — L97412 Non-pressure chronic ulcer of right heel and midfoot with fat layer exposed: Secondary | ICD-10-CM | POA: Diagnosis not present

## 2019-08-26 DIAGNOSIS — M8580 Other specified disorders of bone density and structure, unspecified site: Secondary | ICD-10-CM | POA: Diagnosis not present

## 2019-08-31 ENCOUNTER — Telehealth: Payer: Self-pay | Admitting: *Deleted

## 2019-08-31 DIAGNOSIS — E039 Hypothyroidism, unspecified: Secondary | ICD-10-CM | POA: Diagnosis not present

## 2019-08-31 DIAGNOSIS — M8580 Other specified disorders of bone density and structure, unspecified site: Secondary | ICD-10-CM | POA: Diagnosis not present

## 2019-08-31 DIAGNOSIS — Z23 Encounter for immunization: Secondary | ICD-10-CM | POA: Diagnosis not present

## 2019-08-31 DIAGNOSIS — I8390 Asymptomatic varicose veins of unspecified lower extremity: Secondary | ICD-10-CM | POA: Diagnosis not present

## 2019-08-31 DIAGNOSIS — L97412 Non-pressure chronic ulcer of right heel and midfoot with fat layer exposed: Secondary | ICD-10-CM | POA: Diagnosis not present

## 2019-08-31 DIAGNOSIS — E785 Hyperlipidemia, unspecified: Secondary | ICD-10-CM | POA: Diagnosis not present

## 2019-08-31 DIAGNOSIS — I872 Venous insufficiency (chronic) (peripheral): Secondary | ICD-10-CM | POA: Diagnosis not present

## 2019-08-31 NOTE — Telephone Encounter (Signed)
[facility] - [facility] asked the status of the Physician orders sent last week.

## 2019-09-01 NOTE — Telephone Encounter (Signed)
Orders signed - Dr. [name]

## 2019-09-02 DIAGNOSIS — M8580 Other specified disorders of bone density and structure, unspecified site: Secondary | ICD-10-CM | POA: Diagnosis not present

## 2019-09-02 DIAGNOSIS — E785 Hyperlipidemia, unspecified: Secondary | ICD-10-CM | POA: Diagnosis not present

## 2019-09-02 DIAGNOSIS — I8390 Asymptomatic varicose veins of unspecified lower extremity: Secondary | ICD-10-CM | POA: Diagnosis not present

## 2019-09-02 DIAGNOSIS — I872 Venous insufficiency (chronic) (peripheral): Secondary | ICD-10-CM | POA: Diagnosis not present

## 2019-09-02 DIAGNOSIS — L97412 Non-pressure chronic ulcer of right heel and midfoot with fat layer exposed: Secondary | ICD-10-CM | POA: Diagnosis not present

## 2019-09-02 DIAGNOSIS — E039 Hypothyroidism, unspecified: Secondary | ICD-10-CM | POA: Diagnosis not present

## 2019-09-06 DIAGNOSIS — M8580 Other specified disorders of bone density and structure, unspecified site: Secondary | ICD-10-CM | POA: Diagnosis not present

## 2019-09-06 DIAGNOSIS — L97412 Non-pressure chronic ulcer of right heel and midfoot with fat layer exposed: Secondary | ICD-10-CM | POA: Diagnosis not present

## 2019-09-06 DIAGNOSIS — I872 Venous insufficiency (chronic) (peripheral): Secondary | ICD-10-CM | POA: Diagnosis not present

## 2019-09-06 DIAGNOSIS — I8390 Asymptomatic varicose veins of unspecified lower extremity: Secondary | ICD-10-CM | POA: Diagnosis not present

## 2019-09-06 DIAGNOSIS — E039 Hypothyroidism, unspecified: Secondary | ICD-10-CM | POA: Diagnosis not present

## 2019-09-06 DIAGNOSIS — E785 Hyperlipidemia, unspecified: Secondary | ICD-10-CM | POA: Diagnosis not present

## 2019-09-09 ENCOUNTER — Telehealth: Payer: Self-pay | Admitting: *Deleted

## 2019-09-09 DIAGNOSIS — I872 Venous insufficiency (chronic) (peripheral): Secondary | ICD-10-CM | POA: Diagnosis not present

## 2019-09-09 DIAGNOSIS — Z950 Presence of cardiac pacemaker: Secondary | ICD-10-CM | POA: Diagnosis not present

## 2019-09-09 DIAGNOSIS — M8580 Other specified disorders of bone density and structure, unspecified site: Secondary | ICD-10-CM | POA: Diagnosis not present

## 2019-09-09 DIAGNOSIS — E785 Hyperlipidemia, unspecified: Secondary | ICD-10-CM | POA: Diagnosis not present

## 2019-09-09 DIAGNOSIS — L97412 Non-pressure chronic ulcer of right heel and midfoot with fat layer exposed: Secondary | ICD-10-CM | POA: Diagnosis not present

## 2019-09-09 DIAGNOSIS — Z853 Personal history of malignant neoplasm of breast: Secondary | ICD-10-CM | POA: Diagnosis not present

## 2019-09-09 DIAGNOSIS — I442 Atrioventricular block, complete: Secondary | ICD-10-CM | POA: Diagnosis not present

## 2019-09-09 DIAGNOSIS — E039 Hypothyroidism, unspecified: Secondary | ICD-10-CM | POA: Diagnosis not present

## 2019-09-09 NOTE — Telephone Encounter (Signed)
I told [facility] - Anabel Halon to fax to my fax and I would give to Dr. [name], she states she will have the nursing orders coordinator fax to my fax [phone].

## 2019-09-09 NOTE — Telephone Encounter (Signed)
[place] w/ [facility] [phone]) is calling about the status of an order that was faxed. Please  review ,sign and date by the patient's physician and refax. Any questions call [phone].

## 2019-09-10 ENCOUNTER — Telehealth: Payer: Self-pay | Admitting: Podiatry

## 2019-09-10 DIAGNOSIS — E785 Hyperlipidemia, unspecified: Secondary | ICD-10-CM | POA: Diagnosis not present

## 2019-09-10 DIAGNOSIS — I872 Venous insufficiency (chronic) (peripheral): Secondary | ICD-10-CM | POA: Diagnosis not present

## 2019-09-10 DIAGNOSIS — L97412 Non-pressure chronic ulcer of right heel and midfoot with fat layer exposed: Secondary | ICD-10-CM | POA: Diagnosis not present

## 2019-09-10 DIAGNOSIS — E039 Hypothyroidism, unspecified: Secondary | ICD-10-CM | POA: Diagnosis not present

## 2019-09-10 DIAGNOSIS — M8580 Other specified disorders of bone density and structure, unspecified site: Secondary | ICD-10-CM | POA: Diagnosis not present

## 2019-09-10 DIAGNOSIS — I442 Atrioventricular block, complete: Secondary | ICD-10-CM | POA: Diagnosis not present

## 2019-09-10 NOTE — Telephone Encounter (Signed)
Crystal from [facility] is following up on the status of physicians notes that were faxed over.

## 2019-09-14 DIAGNOSIS — E039 Hypothyroidism, unspecified: Secondary | ICD-10-CM | POA: Diagnosis not present

## 2019-09-14 DIAGNOSIS — I442 Atrioventricular block, complete: Secondary | ICD-10-CM | POA: Diagnosis not present

## 2019-09-14 DIAGNOSIS — E785 Hyperlipidemia, unspecified: Secondary | ICD-10-CM | POA: Diagnosis not present

## 2019-09-14 DIAGNOSIS — M8580 Other specified disorders of bone density and structure, unspecified site: Secondary | ICD-10-CM | POA: Diagnosis not present

## 2019-09-14 DIAGNOSIS — I872 Venous insufficiency (chronic) (peripheral): Secondary | ICD-10-CM | POA: Diagnosis not present

## 2019-09-14 DIAGNOSIS — L97412 Non-pressure chronic ulcer of right heel and midfoot with fat layer exposed: Secondary | ICD-10-CM | POA: Diagnosis not present

## 2019-09-15 ENCOUNTER — Ambulatory Visit (INDEPENDENT_AMBULATORY_CARE_PROVIDER_SITE_OTHER): Payer: Medicare Other | Admitting: Podiatry

## 2019-09-15 ENCOUNTER — Other Ambulatory Visit: Payer: Self-pay

## 2019-09-15 DIAGNOSIS — I872 Venous insufficiency (chronic) (peripheral): Secondary | ICD-10-CM

## 2019-09-15 DIAGNOSIS — L97512 Non-pressure chronic ulcer of other part of right foot with fat layer exposed: Secondary | ICD-10-CM

## 2019-09-16 ENCOUNTER — Other Ambulatory Visit: Payer: Self-pay

## 2019-09-16 DIAGNOSIS — I872 Venous insufficiency (chronic) (peripheral): Secondary | ICD-10-CM | POA: Diagnosis not present

## 2019-09-16 DIAGNOSIS — I442 Atrioventricular block, complete: Secondary | ICD-10-CM | POA: Diagnosis not present

## 2019-09-16 DIAGNOSIS — E039 Hypothyroidism, unspecified: Secondary | ICD-10-CM | POA: Diagnosis not present

## 2019-09-16 DIAGNOSIS — M8580 Other specified disorders of bone density and structure, unspecified site: Secondary | ICD-10-CM | POA: Diagnosis not present

## 2019-09-16 DIAGNOSIS — L97412 Non-pressure chronic ulcer of right heel and midfoot with fat layer exposed: Secondary | ICD-10-CM | POA: Diagnosis not present

## 2019-09-16 DIAGNOSIS — E785 Hyperlipidemia, unspecified: Secondary | ICD-10-CM | POA: Diagnosis not present

## 2019-09-17 ENCOUNTER — Telehealth: Payer: Self-pay | Admitting: *Deleted

## 2019-09-17 ENCOUNTER — Ambulatory Visit (INDEPENDENT_AMBULATORY_CARE_PROVIDER_SITE_OTHER): Payer: Medicare Other | Admitting: Family Medicine

## 2019-09-17 ENCOUNTER — Encounter: Payer: Self-pay | Admitting: Family Medicine

## 2019-09-17 VITALS — BP 130/82 | HR 94 | Temp 97.8°F | Resp 12 | Ht 67.0 in | Wt 210.4 lb

## 2019-09-17 DIAGNOSIS — I83015 Varicose veins of right lower extremity with ulcer other part of foot: Secondary | ICD-10-CM

## 2019-09-17 DIAGNOSIS — L97519 Non-pressure chronic ulcer of other part of right foot with unspecified severity: Secondary | ICD-10-CM | POA: Diagnosis not present

## 2019-09-17 DIAGNOSIS — I5032 Chronic diastolic (congestive) heart failure: Secondary | ICD-10-CM

## 2019-09-17 DIAGNOSIS — I872 Venous insufficiency (chronic) (peripheral): Secondary | ICD-10-CM | POA: Diagnosis not present

## 2019-09-17 DIAGNOSIS — E039 Hypothyroidism, unspecified: Secondary | ICD-10-CM

## 2019-09-17 DIAGNOSIS — E876 Hypokalemia: Secondary | ICD-10-CM | POA: Diagnosis not present

## 2019-09-17 DIAGNOSIS — R197 Diarrhea, unspecified: Secondary | ICD-10-CM

## 2019-09-17 DIAGNOSIS — N183 Chronic kidney disease, stage 3 unspecified: Secondary | ICD-10-CM | POA: Insufficient documentation

## 2019-09-17 DIAGNOSIS — N1832 Chronic kidney disease, stage 3b: Secondary | ICD-10-CM | POA: Diagnosis not present

## 2019-09-17 LAB — TSH: TSH: 2.72 u[IU]/mL (ref 0.35–4.50)

## 2019-09-17 LAB — BASIC METABOLIC PANEL
BUN: 20 mg/dL (ref 6–23)
CO2: 26 mEq/L (ref 19–32)
Calcium: 9 mg/dL (ref 8.4–10.5)
Chloride: 108 mEq/L (ref 96–112)
Creatinine, Ser: 1.06 mg/dL (ref 0.40–1.20)
GFR: 48.75 mL/min — ABNORMAL LOW (ref >60.00)
Glucose, Bld: 90 mg/dL (ref 70–99)
Potassium: 3.4 mEq/L — ABNORMAL LOW (ref 3.5–5.1)
Sodium: 140 mEq/L (ref 135–145)

## 2019-09-17 MED ORDER — FESOTERODINE FUMARATE ER 4 MG PO TB24: 4.0000 mg | ORAL_TABLET | Freq: Every day | ORAL | 2 refills | Status: DC

## 2019-09-17 NOTE — Patient Instructions (Signed)
A few things to remember from today's visit:   Try to take Furosemide 10-20 mg in the morning for leg swelling. Lisbeth Ply is for urine frequency, take it at bedtime and let your eye doctor know. No changes in rest of meds.  Please let me know about new medication in a few weeks.  If you need refills please call your pharmacy. Do not use My Chart to request refills or for acute issues that need immediate attention.    Please be sure medication list is accurate. If a new problem present, please set up appointment sooner than planned today.

## 2019-09-17 NOTE — Assessment & Plan Note (Addendum)
Problem otherwise stable. Skin care discussed. We discuss possible complications and side effects of diuretics. Continue topical triamcinolone cream daily prn.

## 2019-09-17 NOTE — Telephone Encounter (Signed)
[place] w/ [facility] is calling for the status of an order ([id number])  An Add on discipline order which was faxed on 09/10/19 for patient. Please call.

## 2019-09-17 NOTE — Progress Notes (Signed)
HPI:  [name] is a 86 y.o. female, who is here today for chronic disease management.  She was last seen on 04/20/19. No new problems since her last visit. Recently saw her cardiologist, no med changes. She is not checking BP at home. Hx of completed AV block, pacemaker remotely check every 3-4 months.  She is not on K+ supplementation. CKD III: eGFR 41-51, Cr 1.1-1.16.  Component     Latest Ref Rng & Units 04/02/2019          Sodium     135 - 145 mEq/L 143  Potassium     3.5 - 5.1 mEq/L 3.3 (L)  Chloride     96 - 112 mEq/L 107  CO2     19 - 32 mEq/L 26  Glucose     70 - 99 mg/dL 95  BUN     6 - 23 mg/dL 15  Creatinine     0.40 - 1.20 mg/dL 1.15 (H)  Calcium     8.4 - 10.5 mg/dL 8.7 (L)  Total Protein     6.5 - 8.1 g/dL 7.4  Albumin     3.5 - 5.0 g/dL 3.8   For a while she has been convinced that there is a woman poisoning her food causing diarrhea and "spraying" her legs, causing dermatitis. Today she is telling me that "this woman finally left." Hx of depression,andxiety,and delusional disorder. She has refused treatment and not longer seeing psychiatrist. She is back to Sunday school, frustrated because there is not service at church due to [place].  She has hx of chronic diarrhea, she thinks problem is aggravated by Furosemide, so she is taking 1/4 of tab. She feels like diarrhea is not as bad as when she took the whole Furosemide 20 mg tab. Still not cooking, take out meals.  She does not exercise regularly due to unstable gait. She uses a cane for assistance.  LE edema/venous stasis dermatitis,edema worse at the end of the day. She does not wear compression stockings, hard to put on. Topical steroid helps with erythema and pruritus of skin.  Venous ulcer right ankle/foot, she follows with podiatrist and has wound care 2 times per week. Negative for fever,chills,unusual fatigue,or changes in appetite.  HFpEF: Last echo 12/2012: LVEF  67-34%, grade I diastolic dysfunction. Denies orthopnea and PND.  Hypothyroidism: Last TSH abnormal at 5.43 on 04/02/19. Negative for changes in bowel habits,abnormal wt loss,cold/heat intolerance.  She is on Levothyroxine 50 mcg daily..  States that she has not followed with urologist in years, she wonders if she has to arrange an appt. She has Hx of renal carcinoma,s/p right nephrectomy, 2009.  Urinary frequency. She goes late to bed, so she does not have to get up from bed to urinate.  She does not drink fluids for 4-5 hours before bedtime.  Nocturia x 2 at least. + Urgency and occasionally incontinence if she does not have a bathroom available. Problem is stable. Denies dysuria,decreased urine output,gross hematuria,or flank/pelvic pain.  She tried Myrbetriq 25 mg but did not help, so she discontinued.  Review of Systems  HENT: Negative for mouth sores, nosebleeds and sore throat.   Eyes: Negative for redness and visual disturbance.  Respiratory: Negative for cough, shortness of breath and wheezing.   Cardiovascular: Negative for chest pain and palpitations.  Gastrointestinal: Negative for abdominal pain, nausea and vomiting.       Negative for changes in bowel habits.  Genitourinary:  Negative for difficulty urinating and flank pain.  Musculoskeletal: Positive for arthralgias and gait problem.  Skin: Positive for wound.  Neurological: Negative for syncope, weakness and headaches.  Psychiatric/Behavioral: Positive for sleep disturbance. Negative for confusion. The patient is nervous/anxious.   Rest of ROS, see pertinent positives sand negatives in HPI  Current Outpatient Medications on File Prior to Visit  Medication Sig Dispense Refill  . Calcium Carbonate-Vitamin D (CALTRATE 600+D PO) Take 600 mg by mouth daily after breakfast.    . furosemide (LASIX) 20 MG tablet Take 0.5 tablets (10 mg total) by mouth daily. 45 tablet 2  . Lactobacillus Rhamnosus, GG, (CULTURELLE PO) Take  1 capsule by mouth every morning.    . latanoprost (XALATAN) 0.005 % ophthalmic solution Place 1 drop into both eyes at bedtime.   10  . midodrine (PROAMATINE) 2.5 MG tablet Take 1 tablet (2.5 mg total) by mouth daily after breakfast. 90 tablet 2  . sulfamethoxazole-trimethoprim (BACTRIM) 400-80 MG tablet Take 1 tablet by mouth 2 (two) times daily. 20 tablet 0   No current facility-administered medications on file prior to visit.     Past Medical History:  Diagnosis Date  . Cataract   . Chronic congestive heart failure, unspecified heart failure type ([place]) 09/19/2019  . Complete heart block (HCC)    s/p PPM implant (MDT) by Dr Blanch Media.  Atrial lead could not be paced at time of the procedure.  She has chronic AV dysociation  . Delusional disorder ([place])   . Exogenous obesity   . Hypercholesterolemia   . Invasive ductal carcinoma of breast ([place]) 03/2007   BILATERAL BREASTS  . Orthostatic hypotension    treated with midodrine by Dr Rollene [name]  . PONV (postoperative nausea and vomiting)   . Thyroid disease   . Venous insufficiency    Allergies  Allergen Reactions  . Amoxicillin-Pot Clavulanate Diarrhea and Nausea And Vomiting    Has patient had a PCN reaction causing immediate rash, facial/tongue/throat swelling, SOB or lightheadedness with hypotension: Yes Has patient had a PCN reaction causing severe rash involving mucus membranes or skin necrosis: No Has patient had a PCN reaction that required hospitalization: No Has patient had a PCN reaction occurring within the last 10 years: Yes If all of the above answers are "NO", then may proceed with Cephalosporin use.   . Erythromycin Hives  . Tape Other (See Comments)    BAND AIDS-SKIN IRRITATION  . Codeine Nausea And Vomiting  . Doxycycline Diarrhea  . Flagyl [Metronidazole] Hives  . Macrobid [Nitrofurantoin Macrocrystal] Nausea And Vomiting  . Tolterodine Nausea And Vomiting  . Ciprofloxacin Hives and Rash    Social History    Socioeconomic History  . Marital status: Widowed    Spouse name: Not on file  . Number of children: Not on file  . Years of education: Not on file  . Highest education level: Not on file  Occupational History  . Not on file  Tobacco Use  . Smoking status: Never Smoker  . Smokeless tobacco: Never Used  Substance and Sexual Activity  . Alcohol use: No    Alcohol/week: 0.0 standard drinks  . Drug use: No  . Sexual activity: Not Currently  Other Topics Concern  . Not on file  Social History Narrative  . Not on file   Social Determinants of Health   Financial Resource Strain:   . Difficulty of Paying Living Expenses:   Food Insecurity:   . Worried About Charity fundraiser in the  Last Year:   . [place] in the Last Year:   Transportation Needs:   . Film/video editor (Medical):   Marland Kitchen Lack of Transportation (Non-Medical):   Physical Activity:   . Days of Exercise per Week:   . Minutes of Exercise per Session:   Stress:   . Feeling of Stress :   Social Connections:   . Frequency of Communication with Friends and Family:   . Frequency of Social Gatherings with Friends and Family:   . Attends Religious Services:   . Active Member of Clubs or Organizations:   . Attends Archivist Meetings:   Marland Kitchen Marital Status:     Vitals:   09/17/19 1154  BP: 130/82  Pulse: 94  Resp: 12  Temp: 97.8 F (36.6 C)  SpO2: 96%   Wt Readings from Last 3 Encounters:  09/17/19 210 lb 6 oz (95.4 kg)  05/18/19 219 lb 9.6 oz (99.6 kg)  04/02/19 250 lb (113.4 kg)   Body mass index is 32.95 kg/m.  Physical Exam  Nursing note and vitals reviewed. Constitutional: She is oriented to person, place, and time. She appears well-developed. No distress.  HENT:  Head: Normocephalic and atraumatic.  Mouth/Throat: Oropharynx is clear and moist and mucous membranes are normal.  Eyes: Pupils are equal, round, and reactive to light. Conjunctivae are normal.  Cardiovascular: Normal  rate and regular rhythm.  No murmur heard. Respiratory: Effort normal and breath sounds normal. No respiratory distress.  GI: Soft. She exhibits no mass. There is no abdominal tenderness.  Musculoskeletal:        General: Edema (1+ pitting LE edema, bilateral. Lymphedema LE bilateral.) present.  Neurological: She is alert and oriented to person, place, and time. She has normal strength. No cranial nerve deficit.  Unstable gait assisted by a cane.  Skin: Skin is warm. Rash noted. There is erythema.  Erythema and thick crusty area on pretibial area, bilateral. No local heat, drainage,or tenderness.   Psychiatric: Her mood appears anxious.  Well groomed, good eye contact.   ASSESSMENT AND PLAN:  [name] was seen today for chronic disease management.  Lab Results  Component Value Date   TSH 2.72 09/17/2019   Lab Results  Component Value Date   CREATININE 1.06 09/17/2019   BUN 20 09/17/2019   NA 140 09/17/2019   K 3.4 (L) 09/17/2019   CL 108 09/17/2019   CO2 26 09/17/2019    Hypothyroidism, unspecified type No changes in current management, will follow labs done today and will give further recommendations accordingly. - levothyroxine (SYNTHROID) 50 MCG tablet; Take 1 tablet (50 mcg total) by mouth daily before breakfast.  Dispense: 90 tablet; Refill: 2  Hypokalemia Side effects of Furosemide discussed. Problem can also be aggravated by diarrhea.  Diarrhea, unspecified type Stable. Adequate hydration. Colonoscopy 01/2015 left-sided diverticulosis. She has seen Dr [name].  Chronic heart failure with preserved ejection fraction (HCC) Asymptomatic. She does not want to take whole Furosemide dose (20 mg) Following with cardiologist.  Morbid obesity ([place]) She cannot exercise regularly. Educated about importance of following a healthful low salt diet. Benefits of wt loss discussed.  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  Venous stasis dermatitis of both lower extremities Problem otherwise stable. Skin care discussed. We discuss  Stage 3b chronic kidney disease Low salt diet. Avoid NSAID's. Adequate hydration.  Return in about 6 months (around 03/19/2020).  Kavaughn Faucett G. [place], MD  [facility]. [place] office.  A few things to remember from today's visit:   Try to take Furosemide 10-20 mg in the morning for leg swelling. Lisbeth Ply is for urine frequency, take it at bedtime and let your eye doctor know. No changes in rest of meds.  Please let me know about new medication in a few weeks.  If you need refills please call your pharmacy. Do not use My Chart to request refills or for acute issues that need immediate attention.    Please be sure medication list is accurate. If a new problem present, please set up appointment sooner than planned today.

## 2019-09-18 NOTE — Progress Notes (Signed)
.     Subjective:  86 y.o. female presenting today for follow up evaluation of an ulceration of the right foot. She reports some intermittent pain. She has been having the dressings changed by home health. There are no modifying factors noted. Patient is here for further evaluation and treatment.   Past Medical History:  Diagnosis Date  . Cataract   . Complete heart block (HCC)    s/p PPM implant (MDT) by Dr Blanch Media.  Atrial lead could not be paced at time of the procedure.  She has chronic AV dysociation  . Delusional disorder ([place])   . Exogenous obesity   . Hypercholesterolemia   . Invasive ductal carcinoma of breast ([place]) 03/2007   BILATERAL BREASTS  . Orthostatic hypotension    treated with midodrine by Dr Rollene [name]  . PONV (postoperative nausea and vomiting)   . Thyroid disease   . Venous insufficiency      Objective/Physical Exam General: The patient is alert and oriented x3 in no acute distress.  Dermatology:  Wound #1 noted to the right foot measuring 1.5 x 0.8 x 0.3 cm (LxWxD).   To the noted ulceration(s), there is no eschar. There is a moderate amount of slough, fibrin, and necrotic tissue noted. Granulation tissue and wound base is red. There is a minimal amount of serosanguineous drainage noted. There is no exposed bone muscle-tendon ligament or joint. There is no malodor. Periwound integrity is intact. Erythema and edema noted to the right lower extremity.   Vascular: Palpable pedal pulses bilaterally. Capillary refill within normal limits. Varicosities noted bilateral lower extremities.   Neurological: Epicritic and protective threshold diminished bilaterally.   Musculoskeletal Exam: Range of motion within normal limits to all pedal and ankle joints bilateral. Muscle strength 5/5 in all groups bilateral.   Assessment: #1 ulceration of the right foot secondary to venous insufficiency #2 varicosities bilateral lower extremities   Plan of Care:  #1 Patient was  evaluated.  #2 medically necessary excisional debridement including subcutaneous tissue was performed using a tissue nipper and a chisel blade. Excisional debridement of all the necrotic nonviable tissue down to healthy bleeding viable tissue was performed with post-debridement measurements same as pre-. #3 the wound was cleansed with normal saline. #4 Dry sterile dressing applied.   #5 Continue home health dressings at home.  #6 Return to clinic in 6 weeks.      Edrick Kins, DPM [facility]  Dr. Edrick Kins, [address]                                        [place], [place] [address]                Office [phone]  Fax [phone]

## 2019-09-19 ENCOUNTER — Encounter: Payer: Self-pay | Admitting: Family Medicine

## 2019-09-19 DIAGNOSIS — I509 Heart failure, unspecified: Secondary | ICD-10-CM

## 2019-09-19 DIAGNOSIS — I5030 Unspecified diastolic (congestive) heart failure: Secondary | ICD-10-CM | POA: Insufficient documentation

## 2019-09-19 HISTORY — DX: Heart failure, unspecified: I50.9

## 2019-09-19 MED ORDER — LEVOTHYROXINE SODIUM 50 MCG PO TABS: 50.0000 ug | ORAL_TABLET | Freq: Every day | ORAL | 2 refills | Status: DC

## 2019-09-19 MED ORDER — POTASSIUM CHLORIDE ER 20 MEQ PO TBCR: 20.0000 meq | EXTENDED_RELEASE_TABLET | Freq: Every day | ORAL | 4 refills | Status: DC

## 2019-09-21 ENCOUNTER — Emergency Department (HOSPITAL_COMMUNITY): Payer: Medicare Other

## 2019-09-21 ENCOUNTER — Inpatient Hospital Stay (HOSPITAL_COMMUNITY)
Admission: EM | Admit: 2019-09-21 | Discharge: 2019-09-24 | DRG: 023 | Disposition: A | Discharge status: Home Health | Payer: Medicare Other | Attending: Neurology | Admitting: Neurology

## 2019-09-21 ENCOUNTER — Encounter (HOSPITAL_COMMUNITY)
Admission: EM | Disposition: A | Discharge status: Home Health | Payer: Self-pay | Source: Home / Self Care | Attending: Neurology

## 2019-09-21 ENCOUNTER — Inpatient Hospital Stay (HOSPITAL_COMMUNITY): Payer: Medicare Other

## 2019-09-21 ENCOUNTER — Emergency Department (HOSPITAL_COMMUNITY): Payer: Medicare Other | Admitting: Certified Registered Nurse Anesthetist

## 2019-09-21 DIAGNOSIS — F22 Delusional disorders: Secondary | ICD-10-CM | POA: Diagnosis present

## 2019-09-21 DIAGNOSIS — Z79899 Other long term (current) drug therapy: Secondary | ICD-10-CM

## 2019-09-21 DIAGNOSIS — R29818 Other symptoms and signs involving the nervous system: Secondary | ICD-10-CM | POA: Diagnosis not present

## 2019-09-21 DIAGNOSIS — Z79890 Hormone replacement therapy (postmenopausal): Secondary | ICD-10-CM | POA: Diagnosis not present

## 2019-09-21 DIAGNOSIS — R29810 Facial weakness: Secondary | ICD-10-CM | POA: Diagnosis present

## 2019-09-21 DIAGNOSIS — J9601 Acute respiratory failure with hypoxia: Secondary | ICD-10-CM

## 2019-09-21 DIAGNOSIS — U071 COVID-19: Secondary | ICD-10-CM | POA: Diagnosis present

## 2019-09-21 DIAGNOSIS — E6609 Other obesity due to excess calories: Secondary | ICD-10-CM | POA: Diagnosis present

## 2019-09-21 DIAGNOSIS — R531 Weakness: Secondary | ICD-10-CM | POA: Diagnosis not present

## 2019-09-21 DIAGNOSIS — R279 Unspecified lack of coordination: Secondary | ICD-10-CM | POA: Diagnosis not present

## 2019-09-21 DIAGNOSIS — R29710 NIHSS score 10: Secondary | ICD-10-CM | POA: Diagnosis present

## 2019-09-21 DIAGNOSIS — I951 Orthostatic hypotension: Secondary | ICD-10-CM | POA: Diagnosis present

## 2019-09-21 DIAGNOSIS — C50911 Malignant neoplasm of unspecified site of right female breast: Secondary | ICD-10-CM | POA: Diagnosis present

## 2019-09-21 DIAGNOSIS — N1831 Chronic kidney disease, stage 3a: Secondary | ICD-10-CM | POA: Diagnosis present

## 2019-09-21 DIAGNOSIS — G8194 Hemiplegia, unspecified affecting left nondominant side: Secondary | ICD-10-CM | POA: Diagnosis present

## 2019-09-21 DIAGNOSIS — I639 Cerebral infarction, unspecified: Secondary | ICD-10-CM | POA: Diagnosis present

## 2019-09-21 DIAGNOSIS — E7800 Pure hypercholesterolemia, unspecified: Secondary | ICD-10-CM | POA: Diagnosis present

## 2019-09-21 DIAGNOSIS — I872 Venous insufficiency (chronic) (peripheral): Secondary | ICD-10-CM | POA: Diagnosis present

## 2019-09-21 DIAGNOSIS — I5032 Chronic diastolic (congestive) heart failure: Secondary | ICD-10-CM | POA: Diagnosis present

## 2019-09-21 DIAGNOSIS — I5030 Unspecified diastolic (congestive) heart failure: Secondary | ICD-10-CM | POA: Diagnosis present

## 2019-09-21 DIAGNOSIS — R000 Tachycardia, unspecified: Secondary | ICD-10-CM | POA: Diagnosis not present

## 2019-09-21 DIAGNOSIS — E079 Disorder of thyroid, unspecified: Secondary | ICD-10-CM | POA: Diagnosis present

## 2019-09-21 DIAGNOSIS — E785 Hyperlipidemia, unspecified: Secondary | ICD-10-CM | POA: Diagnosis present

## 2019-09-21 DIAGNOSIS — I2510 Atherosclerotic heart disease of native coronary artery without angina pectoris: Secondary | ICD-10-CM | POA: Diagnosis present

## 2019-09-21 DIAGNOSIS — Z743 Need for continuous supervision: Secondary | ICD-10-CM | POA: Diagnosis not present

## 2019-09-21 DIAGNOSIS — E039 Hypothyroidism, unspecified: Secondary | ICD-10-CM | POA: Diagnosis present

## 2019-09-21 DIAGNOSIS — F419 Anxiety disorder, unspecified: Secondary | ICD-10-CM | POA: Diagnosis present

## 2019-09-21 DIAGNOSIS — R7303 Prediabetes: Secondary | ICD-10-CM | POA: Diagnosis present

## 2019-09-21 DIAGNOSIS — I878 Other specified disorders of veins: Secondary | ICD-10-CM | POA: Diagnosis present

## 2019-09-21 DIAGNOSIS — R0689 Other abnormalities of breathing: Secondary | ICD-10-CM | POA: Diagnosis not present

## 2019-09-21 DIAGNOSIS — Z950 Presence of cardiac pacemaker: Secondary | ICD-10-CM

## 2019-09-21 DIAGNOSIS — I509 Heart failure, unspecified: Secondary | ICD-10-CM | POA: Diagnosis not present

## 2019-09-21 DIAGNOSIS — H53462 Homonymous bilateral field defects, left side: Secondary | ICD-10-CM | POA: Diagnosis present

## 2019-09-21 DIAGNOSIS — Z8249 Family history of ischemic heart disease and other diseases of the circulatory system: Secondary | ICD-10-CM

## 2019-09-21 DIAGNOSIS — Z853 Personal history of malignant neoplasm of breast: Secondary | ICD-10-CM | POA: Diagnosis not present

## 2019-09-21 DIAGNOSIS — N179 Acute kidney failure, unspecified: Secondary | ICD-10-CM | POA: Diagnosis not present

## 2019-09-21 DIAGNOSIS — Z9842 Cataract extraction status, left eye: Secondary | ICD-10-CM

## 2019-09-21 DIAGNOSIS — I6601 Occlusion and stenosis of right middle cerebral artery: Secondary | ICD-10-CM | POA: Diagnosis not present

## 2019-09-21 DIAGNOSIS — I63511 Cerebral infarction due to unspecified occlusion or stenosis of right middle cerebral artery: Secondary | ICD-10-CM | POA: Diagnosis not present

## 2019-09-21 DIAGNOSIS — R410 Disorientation, unspecified: Secondary | ICD-10-CM | POA: Diagnosis not present

## 2019-09-21 DIAGNOSIS — I6523 Occlusion and stenosis of bilateral carotid arteries: Secondary | ICD-10-CM | POA: Diagnosis not present

## 2019-09-21 DIAGNOSIS — Z6833 Body mass index (BMI) 33.0-33.9, adult: Secondary | ICD-10-CM

## 2019-09-21 DIAGNOSIS — I6389 Other cerebral infarction: Secondary | ICD-10-CM | POA: Diagnosis not present

## 2019-09-21 DIAGNOSIS — C50912 Malignant neoplasm of unspecified site of left female breast: Secondary | ICD-10-CM | POA: Diagnosis present

## 2019-09-21 DIAGNOSIS — Z90710 Acquired absence of both cervix and uterus: Secondary | ICD-10-CM

## 2019-09-21 DIAGNOSIS — N183 Chronic kidney disease, stage 3 unspecified: Secondary | ICD-10-CM | POA: Diagnosis present

## 2019-09-21 DIAGNOSIS — Z905 Acquired absence of kidney: Secondary | ICD-10-CM

## 2019-09-21 DIAGNOSIS — I63411 Cerebral infarction due to embolism of right middle cerebral artery: Secondary | ICD-10-CM | POA: Diagnosis not present

## 2019-09-21 DIAGNOSIS — G461 Anterior cerebral artery syndrome: Secondary | ICD-10-CM | POA: Diagnosis not present

## 2019-09-21 DIAGNOSIS — Z9841 Cataract extraction status, right eye: Secondary | ICD-10-CM

## 2019-09-21 HISTORY — PX: RADIOLOGY WITH ANESTHESIA: SHX6223

## 2019-09-21 HISTORY — PX: IR CT HEAD LTD: IMG2386

## 2019-09-21 HISTORY — PX: IR PERCUTANEOUS ART THROMBECTOMY/INFUSION INTRACRANIAL INC DIAG ANGIO: IMG6087

## 2019-09-21 LAB — CBG MONITORING, ED: Glucose-Capillary: 86 mg/dL (ref 70–99)

## 2019-09-21 LAB — DIFFERENTIAL
Abs Immature Granulocytes: 0.04 10*3/uL (ref 0.00–0.07)
Basophils Absolute: 0.1 10*3/uL (ref 0.0–0.1)
Basophils Relative: 1 %
Eosinophils Absolute: 0.6 10*3/uL — ABNORMAL HIGH (ref 0.0–0.5)
Eosinophils Relative: 7 %
Immature Granulocytes: 0 %
Lymphocytes Relative: 15 %
Lymphs Abs: 1.4 10*3/uL (ref 0.7–4.0)
Monocytes Absolute: 0.8 10*3/uL (ref 0.1–1.0)
Monocytes Relative: 9 %
Neutro Abs: 6.3 10*3/uL (ref 1.7–7.7)
Neutrophils Relative %: 68 %

## 2019-09-21 LAB — CBC
HCT: 42.8 % (ref 36.0–46.0)
Hemoglobin: 13.6 g/dL (ref 12.0–15.0)
MCH: 31.1 pg (ref 26.0–34.0)
MCHC: 31.8 g/dL (ref 30.0–36.0)
MCV: 97.7 fL (ref 80.0–100.0)
Platelets: 233 10*3/uL (ref 150–400)
RBC: 4.38 MIL/uL (ref 3.87–5.11)
RDW: 13.8 % (ref 11.5–15.5)
WBC: 9.1 10*3/uL (ref 4.0–10.5)
nRBC: 0 % (ref 0.0–0.2)

## 2019-09-21 LAB — I-STAT CHEM 8, ED
BUN: 19 mg/dL (ref 8–23)
Calcium, Ion: 1.14 mmol/L — ABNORMAL LOW (ref 1.15–1.40)
Chloride: 107 mmol/L (ref 98–111)
Creatinine, Ser: 1.2 mg/dL — ABNORMAL HIGH (ref 0.44–1.00)
Glucose, Bld: 89 mg/dL (ref 70–99)
HCT: 42 % (ref 36.0–46.0)
Hemoglobin: 14.3 g/dL (ref 12.0–15.0)
Potassium: 3.5 mmol/L (ref 3.5–5.1)
Sodium: 143 mmol/L (ref 135–145)
TCO2: 26 mmol/L (ref 22–32)

## 2019-09-21 LAB — APTT: aPTT: 33 seconds (ref 24–36)

## 2019-09-21 LAB — COMPREHENSIVE METABOLIC PANEL
ALT: 12 U/L (ref 0–44)
AST: 17 U/L (ref 15–41)
Albumin: 3.6 g/dL (ref 3.5–5.0)
Alkaline Phosphatase: 84 U/L (ref 38–126)
Anion gap: 9 (ref 5–15)
BUN: 18 mg/dL (ref 8–23)
CO2: 25 mmol/L (ref 22–32)
Calcium: 9 mg/dL (ref 8.9–10.3)
Chloride: 108 mmol/L (ref 98–111)
Creatinine, Ser: 1.23 mg/dL — ABNORMAL HIGH (ref 0.44–1.00)
GFR calc Af Amer: 45 mL/min — ABNORMAL LOW (ref >60)
GFR calc non Af Amer: 39 mL/min — ABNORMAL LOW (ref >60)
Glucose, Bld: 92 mg/dL (ref 70–99)
Potassium: 3.6 mmol/L (ref 3.5–5.1)
Sodium: 142 mmol/L (ref 135–145)
Total Bilirubin: 0.8 mg/dL (ref 0.3–1.2)
Total Protein: 6.9 g/dL (ref 6.5–8.1)

## 2019-09-21 LAB — PROTIME-INR
INR: 1 (ref 0.8–1.2)
Prothrombin Time: 12.4 seconds (ref 11.4–15.2)

## 2019-09-21 LAB — GLUCOSE, CAPILLARY
Glucose-Capillary: 127 mg/dL — ABNORMAL HIGH (ref 70–99)
Glucose-Capillary: 142 mg/dL — ABNORMAL HIGH (ref 70–99)
Glucose-Capillary: 180 mg/dL — ABNORMAL HIGH (ref 70–99)

## 2019-09-21 LAB — MRSA PCR SCREENING: MRSA by PCR: NEGATIVE

## 2019-09-21 LAB — SARS CORONAVIRUS 2 BY RT PCR (HOSPITAL ORDER, PERFORMED IN ~~LOC~~ HOSPITAL LAB): SARS Coronavirus 2: POSITIVE — AB

## 2019-09-21 SURGERY — IR WITH ANESTHESIA
Anesthesia: Choice

## 2019-09-21 MED ORDER — CLEVIDIPINE BUTYRATE 0.5 MG/ML IV EMUL: 0.0000 mg/h | INTRAVENOUS | Status: DC

## 2019-09-21 MED ORDER — PANTOPRAZOLE SODIUM 40 MG IV SOLR
40.0000 mg | Freq: Every day | INTRAVENOUS | Status: DC
  Administered 2019-09-21: 40 mg via INTRAVENOUS
  Filled 2019-09-21: qty 40

## 2019-09-21 MED ORDER — ACETAMINOPHEN 160 MG/5ML PO SOLN: 650.0000 mg | ORAL | Status: DC | PRN

## 2019-09-21 MED ORDER — ACETAMINOPHEN 325 MG PO TABS: 650.0000 mg | ORAL_TABLET | ORAL | Status: DC | PRN

## 2019-09-21 MED ORDER — TICAGRELOR 90 MG PO TABS
ORAL_TABLET | ORAL | Status: AC
  Filled 2019-09-21: qty 2

## 2019-09-21 MED ORDER — SODIUM CHLORIDE 0.9 % IV SOLN
50.0000 mL | Freq: Once | INTRAVENOUS | Status: AC
  Administered 2019-09-21: 50 mL via INTRAVENOUS

## 2019-09-21 MED ORDER — EPTIFIBATIDE 20 MG/10ML IV SOLN
INTRAVENOUS | Status: AC
  Filled 2019-09-21: qty 10

## 2019-09-21 MED ORDER — SUGAMMADEX SODIUM 200 MG/2ML IV SOLN
INTRAVENOUS | Status: DC | PRN
Start: 2019-09-21 — End: 2019-09-21
  Administered 2019-09-21: 400 mg via INTRAVENOUS

## 2019-09-21 MED ORDER — DEXAMETHASONE 6 MG PO TABS
6.0000 mg | ORAL_TABLET | Freq: Every day | ORAL | Status: DC
  Administered 2019-09-21 – 2019-09-23 (×3): 6 mg via ORAL
  Filled 2019-09-21 (×2): qty 2
  Filled 2019-09-21: qty 1

## 2019-09-21 MED ORDER — MIDODRINE HCL 5 MG PO TABS
2.5000 mg | ORAL_TABLET | Freq: Every day | ORAL | Status: DC
  Administered 2019-09-22 – 2019-09-23 (×2): 2.5 mg via ORAL
  Filled 2019-09-21 (×3): qty 1

## 2019-09-21 MED ORDER — IOHEXOL 240 MG/ML SOLN
150.0000 mL | Freq: Once | INTRAMUSCULAR | Status: AC | PRN
  Administered 2019-09-21: 50 mL via INTRA_ARTERIAL

## 2019-09-21 MED ORDER — LIDOCAINE 2% (20 MG/ML) 5 ML SYRINGE
INTRAMUSCULAR | Status: DC | PRN
  Administered 2019-09-21: 60 mg via INTRAVENOUS

## 2019-09-21 MED ORDER — POTASSIUM CHLORIDE CRYS ER 20 MEQ PO TBCR
20.0000 meq | EXTENDED_RELEASE_TABLET | Freq: Every day | ORAL | Status: DC
  Administered 2019-09-22 – 2019-09-23 (×2): 20 meq via ORAL
  Filled 2019-09-21 (×2): qty 1

## 2019-09-21 MED ORDER — SENNOSIDES-DOCUSATE SODIUM 8.6-50 MG PO TABS: 1.0000 | ORAL_TABLET | Freq: Every evening | ORAL | Status: DC | PRN

## 2019-09-21 MED ORDER — CLOPIDOGREL BISULFATE 300 MG PO TABS
ORAL_TABLET | ORAL | Status: AC
  Filled 2019-09-21: qty 1

## 2019-09-21 MED ORDER — PHENYLEPHRINE HCL-NACL 10-0.9 MG/250ML-% IV SOLN
INTRAVENOUS | Status: DC | PRN
  Administered 2019-09-21: 50 ug/min via INTRAVENOUS

## 2019-09-21 MED ORDER — ASPIRIN 81 MG PO CHEW
CHEWABLE_TABLET | ORAL | Status: AC
  Filled 2019-09-21: qty 1

## 2019-09-21 MED ORDER — INSULIN ASPART 100 UNIT/ML ~~LOC~~ SOLN
1.0000 [IU] | SUBCUTANEOUS | Status: DC
  Administered 2019-09-21 – 2019-09-22 (×3): 1 [IU] via SUBCUTANEOUS
  Administered 2019-09-22: 2 [IU] via SUBCUTANEOUS
  Administered 2019-09-22 (×3): 1 [IU] via SUBCUTANEOUS
  Administered 2019-09-22: 2 [IU] via SUBCUTANEOUS
  Administered 2019-09-23: 1 [IU] via SUBCUTANEOUS

## 2019-09-21 MED ORDER — PROPOFOL 10 MG/ML IV BOLUS
INTRAVENOUS | Status: DC | PRN
  Administered 2019-09-21: 120 mg via INTRAVENOUS

## 2019-09-21 MED ORDER — ROCURONIUM BROMIDE 100 MG/10ML IV SOLN
INTRAVENOUS | Status: DC | PRN
  Administered 2019-09-21: 90 mg via INTRAVENOUS

## 2019-09-21 MED ORDER — LACTATED RINGERS IV SOLN: INTRAVENOUS | Status: DC | PRN

## 2019-09-21 MED ORDER — DEXAMETHASONE SODIUM PHOSPHATE 10 MG/ML IJ SOLN: 6.0000 mg | INTRAMUSCULAR | Status: DC

## 2019-09-21 MED ORDER — FUROSEMIDE 20 MG PO TABS
10.0000 mg | ORAL_TABLET | Freq: Every day | ORAL | Status: DC
  Administered 2019-09-22 – 2019-09-23 (×2): 10 mg via ORAL
  Filled 2019-09-21 (×2): qty 1

## 2019-09-21 MED ORDER — SODIUM CHLORIDE 0.9 % IV SOLN: INTRAVENOUS | Status: DC

## 2019-09-21 MED ORDER — SODIUM CHLORIDE 0.9% FLUSH
3.0000 mL | Freq: Once | INTRAVENOUS | Status: AC
Start: 2019-09-21 — End: 2019-09-21
  Administered 2019-09-21: 3 mL via INTRAVENOUS

## 2019-09-21 MED ORDER — ALTEPLASE (STROKE) FULL DOSE INFUSION
0.9000 mg/kg | Freq: Once | INTRAVENOUS | Status: AC
  Administered 2019-09-21: 86.9 mg via INTRAVENOUS
  Filled 2019-09-21: qty 100

## 2019-09-21 MED ORDER — LEVOTHYROXINE SODIUM 50 MCG PO TABS
50.0000 ug | ORAL_TABLET | Freq: Every day | ORAL | Status: DC
  Administered 2019-09-22 – 2019-09-23 (×2): 50 ug via ORAL
  Filled 2019-09-21 (×2): qty 1

## 2019-09-21 MED ORDER — IOHEXOL 240 MG/ML SOLN
INTRAMUSCULAR | Status: AC
  Filled 2019-09-21: qty 200

## 2019-09-21 MED ORDER — PROPOFOL 500 MG/50ML IV EMUL
INTRAVENOUS | Status: DC | PRN
  Administered 2019-09-21: 75 ug/kg/min via INTRAVENOUS

## 2019-09-21 MED ORDER — LATANOPROST 0.005 % OP SOLN
1.0000 [drp] | Freq: Every day | OPHTHALMIC | Status: DC
  Administered 2019-09-21: 1 [drp] via OPHTHALMIC
  Filled 2019-09-21 (×2): qty 2.5

## 2019-09-21 MED ORDER — STROKE: EARLY STAGES OF RECOVERY BOOK: Freq: Once | Status: DC

## 2019-09-21 MED ORDER — VERAPAMIL HCL 2.5 MG/ML IV SOLN
INTRAVENOUS | Status: AC | PRN
  Administered 2019-09-21: 5 mg via INTRA_ARTERIAL

## 2019-09-21 MED ORDER — SODIUM CHLORIDE 0.9 % IV SOLN
100.0000 mg | Freq: Every day | INTRAVENOUS | Status: DC
  Administered 2019-09-22 – 2019-09-23 (×2): 100 mg via INTRAVENOUS
  Filled 2019-09-21 (×3): qty 20

## 2019-09-21 MED ORDER — FENTANYL CITRATE (PF) 100 MCG/2ML IJ SOLN
INTRAMUSCULAR | Status: DC | PRN
  Administered 2019-09-21: 100 ug via INTRAVENOUS

## 2019-09-21 MED ORDER — TIROFIBAN HCL IN NACL 5-0.9 MG/100ML-% IV SOLN
INTRAVENOUS | Status: AC
  Filled 2019-09-21: qty 100

## 2019-09-21 MED ORDER — DEXAMETHASONE SODIUM PHOSPHATE 4 MG/ML IJ SOLN
INTRAMUSCULAR | Status: DC | PRN
  Administered 2019-09-21: 5 mg via INTRAVENOUS

## 2019-09-21 MED ORDER — SODIUM CHLORIDE 0.9 % IV SOLN
200.0000 mg | Freq: Once | INTRAVENOUS | Status: AC
  Administered 2019-09-21: 200 mg via INTRAVENOUS
  Filled 2019-09-21: qty 40

## 2019-09-21 MED ORDER — ONDANSETRON HCL 4 MG/2ML IJ SOLN
INTRAMUSCULAR | Status: DC | PRN
  Administered 2019-09-21: 4 mg via INTRAVENOUS

## 2019-09-21 MED ORDER — VERAPAMIL HCL 2.5 MG/ML IV SOLN
INTRAVENOUS | Status: AC
  Filled 2019-09-21: qty 2

## 2019-09-21 MED ORDER — ACETAMINOPHEN 650 MG RE SUPP: 650.0000 mg | RECTAL | Status: DC | PRN

## 2019-09-21 MED ORDER — LABETALOL HCL 5 MG/ML IV SOLN: 20.0000 mg | Freq: Once | INTRAVENOUS | Status: DC

## 2019-09-21 MED ORDER — CHLORHEXIDINE GLUCONATE CLOTH 2 % EX PADS: 6.0000 | MEDICATED_PAD | Freq: Every day | CUTANEOUS | Status: DC

## 2019-09-21 MED ORDER — IOHEXOL 350 MG/ML SOLN
60.0000 mL | Freq: Once | INTRAVENOUS | Status: AC | PRN
  Administered 2019-09-21: 60 mL via INTRAVENOUS

## 2019-09-21 MED ORDER — FENTANYL CITRATE (PF) 100 MCG/2ML IJ SOLN
INTRAMUSCULAR | Status: AC
  Filled 2019-09-21: qty 2

## 2019-09-21 MED ORDER — PROPOFOL 500 MG/50ML IV EMUL
INTRAVENOUS | Status: DC | PRN
Start: 2019-09-21 — End: 2019-09-21

## 2019-09-21 MED ORDER — IOHEXOL 300 MG/ML  SOLN
50.0000 mL | Freq: Once | INTRAMUSCULAR | Status: AC | PRN
  Administered 2019-09-21: 5 mL

## 2019-09-21 NOTE — Anesthesia Postprocedure Evaluation (Signed)
Anesthesia Post Note  Patient: BRE GETCHELL  Procedure(s) Performed: IR WITH ANESTHESIA CODE STROKE (N/A )     Patient location during evaluation: SICU Anesthesia Type: General Level of consciousness: sedated Pain management: pain level controlled Vital Signs Assessment: post-procedure vital signs reviewed and stable Respiratory status: patient remains intubated per anesthesia plan Cardiovascular status: stable Postop Assessment: no apparent nausea or vomiting Anesthetic complications: no    Last Vitals:  Vitals:   09/21/19 1445 09/21/19 1500  BP: 121/68 107/62  Pulse: 68 60  Resp: 14 17  SpO2: 98% 92%    Last Pain: There were no vitals filed for this visit.               Lidia Collum

## 2019-09-21 NOTE — Consult Note (Signed)
NAME:  [name], MRN:  [id number], DOB:  07-20-1927, LOS: 0 ADMISSION DATE:  09/21/2019, CONSULTATION DATE:  09/21/19 REFERRING MD:  Dr Lorraine Lax, CHIEF COMPLAINT:  Post operative resp insuff  Brief History   86 yo cf presented after L sided weakness when standing in line to get 2nd covid vaccine injection at [facility], R MCA cva s/p thrombectomy, covid +  History of present illness   86 yo cf with pmh chf, complete hb s/p ppm, orthostatic hypotension, venous insufficiency, presented via ems after sudden onset of L sided weakness at 930am. She was noted to have R M2 occlusion given tpa and taken for thrombectomy. Post operatively pt had resp insufficiency req intubation. Upon initial assessment pt was sedated on propofol. When that was weaned pt was awake and following commands equally bilaterally. She was placed on short cpap and subsequently extubated to 4L [place]. Incidentally pt was found to be covid positive (she had completed her first covid vaccine and was in line for her second when the symptoms began).  Pt does endorse she may not have been feeling "my best" for the past few days. Endorses cough. She denies any other complaints and is not sure about the events that led her but states she felt worse after she got here and "you keep messing with me". Pt was surprised to hear about her cva and her being covid positive.   She is sating ~95% on 4L [place].    Past Medical History   Past Medical History:  Diagnosis Date  . Cataract   . Chronic congestive heart failure, unspecified heart failure type ([place]) 09/19/2019  . Complete heart block (HCC)    s/p PPM implant (MDT) by Dr Blanch Media.  Atrial lead could not be paced at time of the procedure.  She has chronic AV dysociation  . Delusional disorder ([place])   . Exogenous obesity   . Hypercholesterolemia   . Invasive ductal carcinoma of breast ([place]) 03/2007   BILATERAL BREASTS  . Orthostatic hypotension    treated with midodrine by Dr Rollene [name]  .  PONV (postoperative nausea and vomiting)   . Thyroid disease   . Venous insufficiency     Significant Hospital Events   5/25: R MCA   Consults:  PCCM  Procedures:  5/25: R M2 thrombectomy with recannulization  Significant Diagnostic Tests:  5/25 cth: 1. Hyperdense right M2 branches compatible with acute thrombus. Negative for acute infarct or hemorrhage 5/25 cta: 1. 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  2. No other arterial stenosis in the head or neck. Dolichoectatic bilateral ICA siphons measuring 7-9 mm diameter. Bilateral fetal PCA origins. Mild for age proximal ICA atherosclerosis.   Micro Data:  5/25 sars2: positive  Antimicrobials:  remdesivir  Interim history/subjective:    Objective   Blood pressure (!) 150/60, pulse 64, resp. rate 14, weight 96.6 kg, SpO2 100 %.    Vent Mode: PRVC FiO2 (%):  [40 %] 40 % Set Rate:  [14 bmp] 14 bmp Vt Set:  [500 mL] 500 mL PEEP:  [5 cmH20] 5 cmH20 Plateau Pressure:  [18 cmH20] 18 cmH20   Intake/Output Summary (Last 24 hours) at 09/21/2019 1402 Last data filed at 09/21/2019 1241 Gross per 24 hour  Intake --  Output 50 ml  Net -50 ml   Filed Weights   09/21/19 1000  Weight: 96.6 kg    Examination: General: no acute distress, ett in place in nad  HEENT: NCAT, EOMI, PERRLA, MMMP Lungs: CTA, no wheezes, rhonchi, rales Cardiovascular: RRR, no m/g/r Abdomen: soft, NT,ND, BS+ Extremities: + edema with erythema, chronic venous stasis changes, R foot wound Skin: no rashes, warm and dry Neuro: moves all 4 extremities equally with 5/5 strength GU: purewic in place  Resolved Hospital Problem list     Assessment & Plan:  Acute post operative resp insuff:  -short cpap then plans to extubate -pt intubated for acte cva  covid 19:  -stat cxr -pt is req 4L [place] and states she was not feeling "her best" the past few days -s/p 1 dose of vaccine (with second due  today) -in light of above will start remdesivir and decadron from 5 and 10 days respectively -titrate oxygen to sat goal >92%  RMCA CVA:  -s/p tpa and thrombectomy -per stroke team  -goal sbp <140 -PT/OT  CKD3a:  -monitor uop and indices  Orthostatic hypotension:  -on chronic midodrine H/o CHF:  -echo pending -unable to pull up old echo for systolic/diastolic Complete hb s/p ppm: -noted  Hypothyroidism:  -levothyroxine  Prediabetes:  -last a1c 09/2018 5.8 -recheck -glucose monitoring.  Best practice:  Diet: advance after swallow eval Pain/Anxiety/Delirium protocol (if indicated): hold in light of plans for extubation VAP protocol (if indicated): n/a DVT prophylaxis: per neuro s/p tpa at this time GI prophylaxis: ppi Glucose control: ssi Mobility: PT/OT eval pending Code Status: FULL Family Communication: per primary Disposition: ICU  Labs   CBC: Recent Labs  Lab 09/21/19 1000 09/21/19 1005  WBC 9.1  --   NEUTROABS 6.3  --   HGB 13.6 14.3  HCT 42.8 42.0  MCV 97.7  --   PLT 233  --     Basic Metabolic Panel: Recent Labs  Lab 09/17/19 1218 09/21/19 1000 09/21/19 1005  NA 140 142 143  K 3.4* 3.6 3.5  CL 108 108 107  CO2 26 25  --   GLUCOSE 90 92 89  BUN 20 18 19   CREATININE 1.06 1.23* 1.20*  CALCIUM 9.0 9.0  --    GFR: Estimated Creatinine Clearance: 37.9 mL/min (A) (by C-G formula based on SCr of 1.2 mg/dL (H)). Recent Labs  Lab 09/21/19 1000  WBC 9.1    Liver Function Tests: Recent Labs  Lab 09/21/19 1000  AST 17  ALT 12  ALKPHOS 84  BILITOT 0.8  PROT 6.9  ALBUMIN 3.6   No results for input(s): LIPASE, AMYLASE in the last 168 hours. No results for input(s): AMMONIA in the last 168 hours.  ABG    Component Value Date/Time   HCO3 29.0 (H) 11/19/2006 1021   TCO2 26 09/21/2019 1005     Coagulation Profile: Recent Labs  Lab 09/21/19 1000  INR 1.0    Cardiac Enzymes: No results for input(s): CKTOTAL, CKMB, CKMBINDEX,  TROPONINI in the last 168 hours.  HbA1C: Hgb A1c MFr Bld  Date/Time Value Ref Range Status  10/05/2018 06:44 AM 5.8 (H) 4.8 - 5.6 % Final    Comment:    (NOTE) Pre diabetes:          5.7%-6.4% Diabetes:              >6.4% Glycemic control for   <7.0% adults with diabetes   08/11/2015 03:45 AM 6.0 (H) 4.8 - 5.6 % Final    Comment:    (NOTE)         Pre-diabetes: 5.7 - 6.4         Diabetes: >6.4  Glycemic control for adults with diabetes: <7.0     CBG: Recent Labs  Lab 09/21/19 1000  GLUCAP 86    Review of Systems:   + cough, no n/v/d, L weakness and confusion (but pt does not remember this). No fever chills.   Past Medical History  She,  has a past medical history of Cataract, Chronic congestive heart failure, unspecified heart failure type ([place]) (09/19/2019), Complete heart block ([place]), Delusional disorder ([place]), Exogenous obesity, Hypercholesterolemia, Invasive ductal carcinoma of breast ([place]) (03/2007), Orthostatic hypotension, PONV (postoperative nausea and vomiting), Thyroid disease, and Venous insufficiency.   Surgical History    Past Surgical History:  Procedure Laterality Date  . ABDOMINAL HYSTERECTOMY    . BREAST LUMPECTOMY Bilateral 2009  . CATARACT EXTRACTION     EYE SURGERY X 2  . CHOLECYSTECTOMY    . COLONOSCOPY N/A 02/16/2015   Procedure: COLONOSCOPY;  Surgeon: Wilford Corner, MD;  Location[facility] ENDOSCOPY;  Service: Endoscopy;  Laterality: N/A;  . EP IMPLANTABLE DEVICE N/A 02/23/2015   pacemaker generator change (MDT Sensia SR) by Dr Rayann Heman   . ESOPHAGOGASTRODUODENOSCOPY N/A 02/16/2015   Procedure: ESOPHAGOGASTRODUODENOSCOPY (EGD);  Surgeon: Wilford Corner, MD;  Location[facility] ENDOSCOPY;  Service: Endoscopy;  Laterality: N/A;  . PACEMAKER INSERTION     MDT implanted by Dr Blanch Media.  Atrial lead placement was unsuccessful.  She has chronic AV dysociation  . remote right radical nephrectomy  2009     Social History   reports that she has never  smoked. She has never used smokeless tobacco. She reports that she does not drink alcohol or use drugs.   Family History   Her family history includes Heart disease in her maternal grandmother and mother.   Allergies Allergies  Allergen Reactions  . Amoxicillin-Pot Clavulanate Diarrhea and Nausea And Vomiting    Has patient had a PCN reaction causing immediate rash, facial/tongue/throat swelling, SOB or lightheadedness with hypotension: Yes Has patient had a PCN reaction causing severe rash involving mucus membranes or skin necrosis: No Has patient had a PCN reaction that required hospitalization: No Has patient had a PCN reaction occurring within the last 10 years: Yes If all of the above answers are "NO", then may proceed with Cephalosporin use.   . Erythromycin Hives  . Tape Other (See Comments)    BAND AIDS-SKIN IRRITATION  . Codeine Nausea And Vomiting  . Doxycycline Diarrhea  . Flagyl [Metronidazole] Hives  . Macrobid [Nitrofurantoin Macrocrystal] Nausea And Vomiting  . Tolterodine Nausea And Vomiting  . Ciprofloxacin Hives and Rash     Home Medications  Prior to Admission medications   Medication Sig Start Date End Date Taking? Authorizing Provider  Calcium Carbonate-Vitamin D (CALTRATE 600+D PO) Take 600 mg by mouth daily after breakfast.    [provider]  fesoterodine (TOVIAZ) 4 MG TB24 tablet Take 1 tablet (4 mg total) by mouth daily. 09/17/19   [place], [name], MD  furosemide (LASIX) 20 MG tablet Take 0.5 tablets (10 mg total) by mouth daily. 06/11/19   [place], [name], MD  Lactobacillus Rhamnosus, GG, (CULTURELLE PO) Take 1 capsule by mouth every morning.    [provider]  latanoprost (XALATAN) 0.005 % ophthalmic solution Place 1 drop into both eyes at bedtime.  07/03/17   [provider]  levothyroxine (SYNTHROID) 50 MCG tablet Take 1 tablet (50 mcg total) by mouth daily before breakfast. 09/19/19   [place], [name], MD  midodrine (PROAMATINE)  2.5 MG tablet Take 1 tablet (2.5 mg  total) by mouth daily after breakfast. 06/11/19   [place], [name], MD  Potassium Chloride ER 20 MEQ TBCR Take 20 mEq by mouth daily. 09/19/19   [place], [name], MD  sulfamethoxazole-trimethoprim (BACTRIM) 400-80 MG tablet Take 1 tablet by mouth 2 (two) times daily. 05/06/19   Trula Slade, DPM     Critical care time: The patient is critically ill with multiple organ systems failure and requires high complexity decision making for assessment and support, frequent evaluation and titration of therapies, application of advanced monitoring technologies and extensive interpretation of multiple databases.  Critical care time 38 mins. This represents my time independent of the NP's/PA's/med students/residents time taking care of the pt. This is excluding procedures.     [facility] 09/21/2019, 2:02 PM

## 2019-09-21 NOTE — ED Notes (Signed)
Report given to IR RN

## 2019-09-21 NOTE — Transfer of Care (Signed)
Immediate Anesthesia Transfer of Care Note  Patient: [name]  Procedure(s) Performed: IR WITH ANESTHESIA CODE STROKE (N/A )  Patient Location: ICU  Anesthesia Type:General  Level of Consciousness: sedated and Patient remains intubated per anesthesia plan  Airway & Oxygen Therapy: Patient remains intubated per anesthesia plan and Patient placed on Ventilator (see vital sign flow sheet for setting)  Post-op Assessment: Report given to RN and Post -op Vital signs reviewed and stable  Post vital signs: Reviewed and stable  Last Vitals:  Vitals Value Taken Time  BP 126/76 09/21/19 1245  Temp    Pulse 62 09/21/19 1337  Resp 14 09/21/19 1337  SpO2 100 % 09/21/19 1337  Vitals shown include unvalidated device data.  Last Pain: There were no vitals filed for this visit.       Complications: No apparent anesthesia complications

## 2019-09-21 NOTE — Anesthesia Procedure Notes (Signed)
Procedure Name: Intubation Date/Time: 09/21/2019 10:54 AM Performed by: Oletta Lamas, CRNA Pre-anesthesia Checklist: Patient identified, Emergency Drugs available, Suction available and Patient being monitored Patient Re-evaluated:Patient Re-evaluated prior to induction Oxygen Delivery Method: Circle System Utilized Preoxygenation: Pre-oxygenation with 100% oxygen Induction Type: IV induction Ventilation: Mask ventilation without difficulty Laryngoscope Size: 3 and Glidescope Grade View: Grade I Tube type: Oral Tube size: 7.5 mm Number of attempts: 1 Airway Equipment and Method: Stylet Placement Confirmation: ETT inserted through vocal cords under direct vision,  positive ETCO2 and breath sounds checked- equal and bilateral Secured at: 22 cm Tube secured with: Tape Dental Injury: Teeth and Oropharynx as per pre-operative assessment

## 2019-09-21 NOTE — Progress Notes (Signed)
Patient extubated by Dr. Ruthann Cancer. No complications.

## 2019-09-21 NOTE — Code Documentation (Signed)
Patient at [facility] filling prescriptions and waiting for her COVID vaccination when employee noticed she was faltering and somewhat confused after acting completely normal. LKW 0930. They helped her down to the ground and called EMS after left-sided weakness was noted. [facility] arrived and called a code stroke after assessment revealed left weakness, right gaze preference, and a right facial droop. Pt arrived to [facility] and was taken to CT by Stroke team, Neurologist, and RN after EDP clearance. CT/CTA complete. NIHSS 10, VAN positive. IR activation called after LVO positive results according to MD on CTA. TPA was started at 1021. See MAR. Pt taken to [facility] and prepped for procedure. Report given to RN Clarise Cruz. ED RN Janett Billow handed off care. Stroke RN remained with pt until after intubation and transfer into IR suite. 4N charge aware of need for stroke ICU bed. Mayu Ronk, Rande Brunt, RN

## 2019-09-21 NOTE — Progress Notes (Signed)
Pharmacist Code Stroke Response  Notified to mix tPA at 1011 by Dr. Lorraine Lax Delivered tPA to RN at 1018  tPA infusion started at 1021  tPA dose = 8.7mg  bolus over 1 minute followed by 78.2 mg for a total dose of 86.9 mg over 1 hour  Issues/delays encountered (if applicable): None  [name], PharmD., BCPS, BCCCP Clinical Pharmacist Clinical phone for 09/21/19 until 3:30pm: [phone] If after 3:30pm, please refer to [facility] for unit-specific pharmacist

## 2019-09-21 NOTE — Anesthesia Preprocedure Evaluation (Signed)
Anesthesia Evaluation   Patient awake  Preop documentation limited or incomplete due to emergent nature of procedure.  History of Anesthesia Complications Negative for: history of anesthetic complications  Airway Mallampati: II  TM Distance: >3 FB Neck ROM: Full    Dental   Pulmonary neg pulmonary ROS,    Pulmonary exam normal        Cardiovascular +CHF  Normal cardiovascular exam+ dysrhythmias (CHB) + pacemaker      Neuro/Psych CVA    GI/Hepatic   Endo/Other    Renal/GU      Musculoskeletal   Abdominal   Peds  Hematology   Anesthesia Other Findings   Reproductive/Obstetrics                             Anesthesia Physical Anesthesia Plan  ASA: IV and emergent  Anesthesia Plan: General   Post-op Pain Management:    Induction: Intravenous and Rapid sequence  PONV Risk Score and Plan: 3 and Ondansetron, Dexamethasone and Treatment may vary due to age or medical condition  Airway Management Planned: Oral ETT  Additional Equipment: None  Intra-op Plan:   Post-operative Plan: Possible Post-op intubation/ventilation  Informed Consent:     Only emergency history available  Plan Discussed with:   Anesthesia Plan Comments:         Anesthesia Quick Evaluation

## 2019-09-21 NOTE — H&P (Addendum)
Neurology history and physical   History is obtained from: EMS  HPI: [name] is a 86 y.o. female with history of thyroid disease, orthostatic hypotension, hypercholesterolemia, delusional disorder, complete heart block status post pacer, chronic congestive heart failure.  Per EMS patient was at [facility] picking up a prescription.  At 9:30 AM they noted that she became confused with left arm and left leg decreased movement.  Patient was lowered to the ground.  They also noticed that she had a right gaze preference.  Blood pressure at that time was 151/90 and she was paced at 70 bpm.  Patient was brought to [facility] as code stroke.  Patient CT of brain did show a right M2 occlusion.  Patient did receive TPA.  ED course  CT head shows-hyperdense M2 branch compatible with acute thrombus.  Chart review-back in June 2020 patient was in the ED complaining about nausea vomiting.  Patient did have a CT scan which showed an incidental vermis hemorrhage that was 8 mm.  LDL at that time was 87, HbA1c 5.8.  At that time thought process was it possibly could be a cavernous angioma.  Unfortunately patient cannot have a MRI secondary to pacemaker.  LKW: 9:30 AM tpa given?: no, yes Premorbid modified Rankin scale (mRS): 0 NIH stroke score of 10   Past Medical History:  Diagnosis Date  . Cataract   . Chronic congestive heart failure, unspecified heart failure type ([place]) 09/19/2019  . Complete heart block (H[facility])    s/p PPM implant (MDT) by Dr Blanch Media.  Atrial lead could not be paced at time of the procedure.  She has chronic AV dysociation  . Delusional disorder ([place])   . Exogenous obesity   . Hypercholesterolemia   . Invasive ductal carcinoma of breast ([place]) 03/2007   BILATERAL BREASTS  . Orthostatic hypotension    treated with midodrine by Dr Rollene [name]  . PONV (postoperative nausea and vomiting)   . Thyroid disease   . Venous insufficiency     Family History  Problem Relation Age of  Onset  . Heart disease Maternal Grandmother   . Heart disease Mother    Social History:   reports that she has never smoked. She has never used smokeless tobacco. She reports that she does not drink alcohol or use drugs.  Medications  Current Facility-Administered Medications:  .  sodium chloride flush (NS) 0.9 % injection 3 mL, 3 mL, Intravenous, Once, Daleen Bo, MD  Current Outpatient Medications:  .  Calcium Carbonate-Vitamin D (CALTRATE 600+D PO), Take 600 mg by mouth daily after breakfast., Disp: , Rfl:  .  fesoterodine (TOVIAZ) 4 MG TB24 tablet, Take 1 tablet (4 mg total) by mouth daily., Disp: 30 tablet, Rfl: 2 .  furosemide (LASIX) 20 MG tablet, Take 0.5 tablets (10 mg total) by mouth daily., Disp: 45 tablet, Rfl: 2 .  Lactobacillus Rhamnosus, GG, (CULTURELLE PO), Take 1 capsule by mouth every morning., Disp: , Rfl:  .  latanoprost (XALATAN) 0.005 % ophthalmic solution, Place 1 drop into both eyes at bedtime. , Disp: , Rfl: 10 .  levothyroxine (SYNTHROID) 50 MCG tablet, Take 1 tablet (50 mcg total) by mouth daily before breakfast., Disp: 90 tablet, Rfl: 2 .  midodrine (PROAMATINE) 2.5 MG tablet, Take 1 tablet (2.5 mg total) by mouth daily after breakfast., Disp: 90 tablet, Rfl: 2 .  Potassium Chloride ER 20 MEQ TBCR, Take 20 mEq by mouth daily., Disp: 30 tablet, Rfl: 4 .  sulfamethoxazole-trimethoprim (BACTRIM) 400-80  MG tablet, Take 1 tablet by mouth 2 (two) times daily., Disp: 20 tablet, Rfl: 0  ROS:     General ROS: negative for - chills, fatigue, fever, night sweats, weight gain or weight loss Psychological ROS: Positive for - behavioral disorder, delusions  ophthalmic ROS: negative for - blurry vision, double vision, eye pain or loss of vision ENT ROS: negative for - epistaxis, nasal discharge, oral lesions, sore throat, tinnitus or vertigo Respiratory ROS: negative for - cough, hemoptysis, shortness of breath or wheezing Cardiovascular ROS: negative for - chest pain,  dyspnea on exertion, edema or irregular heartbeat Gastrointestinal ROS: negative for - abdominal pain, diarrhea, hematemesis, nausea/vomiting or stool incontinence Genito-Urinary ROS: negative for - dysuria, hematuria, incontinence or urinary frequency/urgency Musculoskeletal ROS: Positive for -  muscular weakness Neurological ROS: as noted in HPI Dermatological ROS: negative for rash and skin lesion changes  Exam: Current vital signs: Wt 96.6 kg   BMI 33.35 kg/m  Vital signs in last 24 hours: Weight:  [96.6 kg] 96.6 kg (05/25 1000)   Constitutional: Appears well-developed and well-nourished.  Psych: Delusional and has a delusional disorder that is known Eyes: No scleral injection HENT: No OP obstrucion Head: Normocephalic.  Cardiovascular: Palpable Respiratory: Effort normal, non-labored breathing GI: Soft.  No distension. There is no tenderness.  Skin: WDI  Neuro: Mental Status: Patient is alert, knows month and age, follows commands such as opening closing her eyes and making a fist and then letting go. Cranial Nerves: II: Left hemianopsia III,IV, VI: Right gaze preference pupils equal, round and reactive to light V: Facial sensation is symmetric to temperature VII: Left facial droop VIII: hearing is intact to voice X: Palat elevates symmetrically XI: Shoulder shrug is symmetric. XII: tongue is midline without atrophy or fasciculations.  Motor: Able to hold left arm and leg antigravity but does show drift.  Right arm and leg shows no drift and able to hold antigravity. Sensory: Patient has neglect of the left side.  Sensation is intact Deep Tendon Reflexes: 2+ and symmetric in the biceps and patellae.  Plantars: Toes are downgoing bilaterally.  Cerebellar: FNF and HKS are intact bilaterally  Labs I have reviewed labs in epic and the results pertinent to this consultation are:   CBC    Component Value Date/Time   WBC 9.1 09/21/2019 1000   RBC 4.38 09/21/2019  1000   HGB 14.3 09/21/2019 1005   HGB 15.3 09/19/2016 1002   HCT 42.0 09/21/2019 1005   HCT 45.5 09/19/2016 1002   PLT 233 09/21/2019 1000   PLT 226 09/19/2016 1002   MCV 97.7 09/21/2019 1000   MCV 93.6 09/19/2016 1002   MCH 31.1 09/21/2019 1000   MCHC 31.8 09/21/2019 1000   RDW 13.8 09/21/2019 1000   RDW 12.7 09/19/2016 1002   LYMPHSABS 1.4 09/21/2019 1000   LYMPHSABS 1.1 09/19/2016 1002   MONOABS 0.8 09/21/2019 1000   MONOABS 0.6 09/19/2016 1002   EOSABS 0.6 (H) 09/21/2019 1000   EOSABS 0.5 09/19/2016 1002   BASOSABS 0.1 09/21/2019 1000   BASOSABS 0.1 09/19/2016 1002    CMP     Component Value Date/Time   NA 143 09/21/2019 1005   NA 143 09/19/2016 1002   K 3.5 09/21/2019 1005   K 3.5 09/19/2016 1002   CL 107 09/21/2019 1005   CL 106 03/03/2012 0831   CO2 26 09/17/2019 1218   CO2 27 09/19/2016 1002   GLUCOSE 89 09/21/2019 1005   GLUCOSE 85 09/19/2016 1002  GLUCOSE 124 (H) 03/03/2012 0831   BUN 19 09/21/2019 1005   BUN 18.2 09/19/2016 1002   CREATININE 1.20 (H) 09/21/2019 1005   CREATININE 1.13 (H) 01/09/2018 1550   CREATININE 1.2 (H) 09/19/2016 1002   CALCIUM 9.0 09/17/2019 1218   CALCIUM 10.0 09/19/2016 1002   PROT 7.4 04/02/2019 1754   PROT 7.2 09/19/2016 1002   ALBUMIN 3.8 04/02/2019 1754   ALBUMIN 3.6 09/19/2016 1002   AST 17 04/02/2019 1754   AST 13 09/19/2016 1002   ALT 12 04/02/2019 1754   ALT 12 09/19/2016 1002   ALKPHOS 96 04/02/2019 1754   ALKPHOS 110 09/19/2016 1002   BILITOT 0.7 04/02/2019 1754   BILITOT 0.41 09/19/2016 1002   GFRNONAA 42 (L) 04/02/2019 1754   GFRAA 49 (L) 04/02/2019 1754    Lipid Panel     Component Value Date/Time   CHOL 147 10/05/2018 0644   TRIG 74 10/05/2018 0644   HDL 45 10/05/2018 0644   CHOLHDL 3.3 10/05/2018 0644   VLDL 15 10/05/2018 0644   LDLCALC 87 10/05/2018 0644     Imaging I have reviewed the images obtained:  CT-scan of the brain hyperdense right M2 branch compatible with acute thrombus  CTA  head neck--positive for emergent large vessel occlusion: Right MCA proximal M2 branch.  No other arterial stenosis in the head or neck. Dolichoectatic bilateral ICA siphons measuring 7-9 mm diameter. Bilateral fetal PCA origins. Mild for age proximal ICA atherosclerosis.  Etta Quill PA-C [facility] [phone]  M-F  (9:00 am- 5:00 PM)  09/21/2019, 10:19 AM     Assessment This is an 86 year old female presented to the hospital as code stroke with symptoms of right gaze preference and left arm and leg weakness along with left-sided neglect.  CT scan confirms that she has a right M2 occlusion/thrombus.  tPA was administered and patient was brought immediately back to interventional radiology to remove clot.  Acute ischemic stroke secondary to right M2 occlusion status post IV TPA and emergent mechanical thrombectomy with TICI 3 recanalization  Acuity: Acute Current Suspected Etiology: Artery to artery -Admit to: ICU -Continue Statin -Hold Aspirin until 24 hour post tPA neuroimaging is stable and without evidence of bleeding -Blood pressure control, goal of SYS <140 -CHO/A1C/Lipid panel.  CNS -Close neuro monitoring  hemiparesis following cerebral infarction affecting left side secondary to M2 thrombus, will need PT/OT  CV - goal SBP <140.  Our goal -Titrate oral agents if unable to get patient systolically less than [id number] start Cleviprex and gently titrate to goal blood pressure  Hyperlipidemia, unspecified  - Statin for goal LDL < 70  Fluid/Electrolyte Disorders -Replete gently  Prophylaxis DVT: SCDs GI: Protonix Bowel: Senokot  Diet: NPO until cleared by speech  Code Status: Full Code  Comfort Measures DNR   NEUROHOSPITALIST ADDENDUM Performed a face to face diagnostic evaluation.   I have reviewed the contents of history and physical exam as documented by PA/ARNP/Resident and agree with above documentation.  I have discussed and formulated the above  plan as documented. Edits to the note have been made as needed.  86 year old female with past medical history of spontaneous cerebellar vermis hemorrhage, congestive heart failure, complete heart block status post pacemaker, delusional disorder [facility] with suddenly at 9:30 AM she became confused.  EMS was called and patient noted to be weak on the left side and gaze preference to the right.  Exam: On arrival to [facility], ER-patient alert and oriented x4.  Had visual neglect as well as left homonymous hemianopsia.  Also had sensory neglect to the left side with left upper and lower extremity drift.  NIH stroke scale 10 on my exam.  Patient denied any history of recent bleeding or recent surgery.  CT head was negative for hemorrhage.  Remote cerebellar hemorrhage not absolute contraindication and therefore patient received IV TPA.  CT angiogram showed right M2 occlusion and therefore patient was taken to IR . Plan:  Received TICI 3 recanalization, post IR CT head with no hemorrhage.  Patient incidentally found to be COVID-19 positive. Recommendations as above including work-up for stroke etiology such as paroxysmal fibrillation.  Pending work-up includes MRI brain, echocardiogram, A1c, lipid profile.  Will need frequent neurochecks.  Repeat CT head in 24 hours post TPA.   CRITICAL CARE Performed by: Lanice Schwab Ab Leaming   Total critical care time:45 minutes  Critical care time was exclusive of separately billable procedures and treating other patients.  Critical care was necessary to treat or prevent imminent or life-threatening deterioration.  Critical care was time spent personally by me on the following activities: development of treatment plan with patient and/or surrogate as well as nursing, discussions with consultants, evaluation of patient's response to treatment, examination of patient, obtaining history from patient or surrogate, ordering and performing treatments  and interventions, ordering and review of laboratory studies, ordering and review of radiographic studies, pulse oximetry and re-evaluation of patient's condition.     Karena Addison Lauralei Clouse MD [facility] [phone]   If 7pm to 7am, please call on call as listed on AMION.

## 2019-09-21 NOTE — ED Triage Notes (Signed)
Pt arrived as code stroke via [facility]- [facility] when she suddenly become confused had to be lower to the ground- on ems arrival had left sided weakness and gaze. Pt met by stroke team on arrival and taken to CT.

## 2019-09-21 NOTE — Procedures (Signed)
INTERVENTIONAL NEURORADIOLOGY BRIEF POSTPROCEDURE NOTE  Attending: Dr. Pedro Earls  Assistant: None  Diagnosis: Right M2/MCA occlusion  Procedure: Diagnostic cerebral angiogram + mechanical thrombectomy  Access site: RCFA  Access closure: Perclose Proglide  Anesthesia: General  Medication used: refer to anesthesia documentation.  Complications: None  Estimated blood loss: 50 mL  Specimen: None  Findings: There was a occlusion of the proximal right M2/MCA occlusion. Mechanical thrombectomy performed with a 5 mm Embotrap + aspiration with complete recanalization (TICI3). No thromboembolic or hemorrhagic complication.  The patient tolerated the procedure well without incident or complication and is in stable condition.

## 2019-09-22 ENCOUNTER — Other Ambulatory Visit: Payer: Self-pay

## 2019-09-22 ENCOUNTER — Inpatient Hospital Stay (HOSPITAL_COMMUNITY): Payer: Medicare Other

## 2019-09-22 ENCOUNTER — Encounter (HOSPITAL_COMMUNITY): Payer: Self-pay | Admitting: Neurology

## 2019-09-22 DIAGNOSIS — J1282 Pneumonia due to coronavirus disease 2019: Secondary | ICD-10-CM

## 2019-09-22 DIAGNOSIS — U071 COVID-19: Secondary | ICD-10-CM

## 2019-09-22 DIAGNOSIS — I6389 Other cerebral infarction: Secondary | ICD-10-CM

## 2019-09-22 LAB — GLUCOSE, CAPILLARY
Glucose-Capillary: 126 mg/dL — ABNORMAL HIGH (ref 70–99)
Glucose-Capillary: 112 mg/dL — ABNORMAL HIGH (ref 70–99)
Glucose-Capillary: 153 mg/dL — ABNORMAL HIGH (ref 70–99)
Glucose-Capillary: 135 mg/dL — ABNORMAL HIGH (ref 70–99)
Glucose-Capillary: 145 mg/dL — ABNORMAL HIGH (ref 70–99)
Glucose-Capillary: 141 mg/dL — ABNORMAL HIGH (ref 70–99)

## 2019-09-22 LAB — LIPID PANEL
Cholesterol: 156 mg/dL (ref 0–200)
HDL: 58 mg/dL (ref >40)
LDL Cholesterol: 89 mg/dL (ref 0–99)
Total CHOL/HDL Ratio: 2.7 RATIO
Triglycerides: 45 mg/dL (ref <150)
VLDL: 9 mg/dL (ref 0–40)

## 2019-09-22 LAB — ECHOCARDIOGRAM COMPLETE
Height: 67 in
Weight: 3407.43 oz

## 2019-09-22 LAB — HEMOGLOBIN A1C
Hgb A1c MFr Bld: 6.1 % — ABNORMAL HIGH (ref 4.8–5.6)
Mean Plasma Glucose: 128.37 mg/dL

## 2019-09-22 MED ORDER — ATORVASTATIN CALCIUM 10 MG PO TABS
10.0000 mg | ORAL_TABLET | Freq: Every day | ORAL | Status: DC
  Administered 2019-09-22 – 2019-09-23 (×2): 10 mg via ORAL
  Filled 2019-09-22 (×2): qty 1

## 2019-09-22 NOTE — Progress Notes (Signed)
Referring Physician(s): Code Stroke- Aroor, Sushanth R  Supervising Physician: Pedro Earls  Patient Status:  [facility] - In-pt  Chief Complaint: None  Subjective:  History of acute CVA s/p cerebral arteriogram with emergent mechanical thrombectomy of right MCA M2 occlusion achieving a TICI 3 revascularization via right femoral approach 09/21/2019 by Dr. Karenann Cai. Patient awake and alert sitting in bed watching TV with no complaints at this time. Can spontaneously move all extremities. Right groin incision c/d/i.   Allergies: Amoxicillin-pot clavulanate, Erythromycin, Tape, Codeine, Doxycycline, Flagyl [metronidazole], Macrobid [nitrofurantoin macrocrystal], Tolterodine, and Ciprofloxacin  Medications: Prior to Admission medications   Medication Sig Start Date End Date Taking? Authorizing Provider  Calcium Carbonate-Vitamin D (CALTRATE 600+D PO) Take 600 mg by mouth daily after breakfast.    [provider]  fesoterodine (TOVIAZ) 4 MG TB24 tablet Take 1 tablet (4 mg total) by mouth daily. 09/17/19   [place], [name], MD  furosemide (LASIX) 20 MG tablet Take 0.5 tablets (10 mg total) by mouth daily. 06/11/19   [place], [name], MD  Lactobacillus Rhamnosus, GG, (CULTURELLE PO) Take 1 capsule by mouth every morning.    [provider]  latanoprost (XALATAN) 0.005 % ophthalmic solution Place 1 drop into both eyes at bedtime.  07/03/17   [provider]  levothyroxine (SYNTHROID) 50 MCG tablet Take 1 tablet (50 mcg total) by mouth daily before breakfast. 09/19/19   [place], [name], MD  midodrine (PROAMATINE) 2.5 MG tablet Take 1 tablet (2.5 mg total) by mouth daily after breakfast. 06/11/19   [place], [name], MD  Potassium Chloride ER 20 MEQ TBCR Take 20 mEq by mouth daily. 09/19/19   [place], [name], MD  sulfamethoxazole-trimethoprim (BACTRIM) 400-80 MG tablet Take 1 tablet by mouth 2 (two) times daily. 05/06/19   Trula Slade, DPM      Vital Signs: BP 116/61   Pulse 64   Temp 97.6 F (36.4 C) (Oral)   Resp (!) 21   Ht 5' 7"  (1.702 m)   Wt 212 lb 15.4 oz (96.6 kg)   SpO2 95%   BMI 33.35 kg/m   Physical Exam Vitals and nursing note reviewed.  Constitutional:      General: She is not in acute distress.    Appearance: Normal appearance.  Pulmonary:     Effort: Pulmonary effort is normal. No respiratory distress.  Skin:    General: Skin is warm and dry.     Comments: Right groin incision soft without active bleeding or hematoma.  Neurological:     Mental Status: She is alert.     Comments: Alert, awake, and oriented x3. Speech and comprehension intact. PERRL bilaterally. EOMs intact bilaterally without nystagmus or subjective diplopia. No facial asymmetry. Tongue midline. Can spontaneously move all extremities. No pronator drift. Distal pulses (DPs) 1+ bilaterally.     Imaging: IR CT Head Ltd  Result Date: 09/21/2019 INDICATION: 86 year old female with past medical history significant for thyroid disease, orthostatic hypotension, hypercholesterolemia, delusional disorder, complete heart block status post pacer, chronic congestive heart failure with a baseline modified Rankin scale 0. She presented to emergency with right gaze preference and left-sided weakness, NIHSS 10. Her last normal 9 a.m. on 09/21/2019. Head CT showed no evidence of a large territorial infarct. CT angiogram showed a proximal right M2/MCA. She was taken to our service for diagnostic cerebral angiogram and mechanical thrombectomy. EXAM: Diagnostic cerebral angiogram Mechanical thrombectomy, right MCA/M2 occlusion Flat panel head CT COMPARISON:  CT/CT  angiogram of the head and neck Sep 21, 2019 MEDICATIONS: 5 mg of verapamil given intra-arterially to the right ICA. ANESTHESIA/SEDATION: The procedure was performed under general anesthesia. FLUOROSCOPY TIME:  Fluoroscopy Time: 7 minutes 48 seconds (414 mGy). COMPLICATIONS: None immediate.  TECHNIQUE: Informed written consent was waved as patient was obtained unable to consent for herself and no healthcare proxy or next of kin was identified in time. Discussion with neuro hospitalist held. We agree that going forward with intervention was in the best interest of the patient. Maximal Sterile Barrier Technique was utilized including caps, mask, sterile gowns, sterile gloves, sterile drape, hand hygiene and skin antiseptic. A timeout was performed prior to the initiation of the procedure. The right groin was prepped and draped in the usual sterile fashion. Using a micropuncture kit and the modified Seldinger technique, access was gained to the right common femoral artery and an 8 French sheath was placed. Then, an [place] balloon guide catheter was navigated over a 6 [place] Berenstein 2 and a 0.035 inch Terumo Glidewire into the aortic arch. Under fluoroscopy, the catheter was placed into the right common carotid artery and then advanced to the cervical right internal carotid artery. The Berenstein catheter and the Glidewire were removed. Frontal and lateral angiograms of the upper neck and head were obtained. FINDINGS: 1. Occlusion of the proximal right M2/MCA inferior division branch. 2. Increased tortuosity of the cervical right ICA. 3. Mild atherosclerotic changes in the right carotid bulb without hemodynamically significant stenosis. 4. Ectatic cavernous segment of the right ICA. 5. Severely hypoplastic A1/ACA segment with no significant opacification of the A2 segment. PROCEDURE: Using biplane roadmap, a catalyst 6 aspiration catheter was navigated over a phenom 21 microcatheter and a synchro support flex microguidewire into the cavernous segment of the right ICA. This phenom microcatheter was then advanced into the proximal M3/MCA inferior division branch. A 5 x 37 mm Embotrap III thrombectomy device was deployed spanning the distal M1 and the entire length of the M2 segment. The device was  allowed to intercalated with the clot for 4 minutes. The microcatheter was removed under fluoroscopy. The aspiration catheter was advanced to the level of occlusion and connected to an aspiration pump. The guiding catheter balloon was inflated and the thrombectomy device plus aspiration catheter were removed en bloc under constant aspiration. The guiding catheter was aspirated. Frontal and lateral angiograms of the head were obtained via guide catheter contrast injection. Complete recanalization of the right MCA was seen (TICI 3). Mild device induced vasospasm was noted. Intra arterial infusion of 5 mg of verapamil was performed over 10 minutes. Follow-up angiograms showed vasospasm resolution. No embolus to new territory seen. Flat panel CT of the head was obtained and post processed in a separate workstation with concurrent attending physician supervision. Selected images were sent to PACS. No evidence of hemorrhagic complication seen. A right common femoral artery angiogram was obtained with right anterior oblique view. The puncture is at the level of the common femoral artery. No significant atherosclerotic disease, plaques are stenosis seen. The femoral sheath was exchanged over the wire for a 6 French Perclose ProGlide which was utilized for access closure. Immediate hemostasis was achieved. IMPRESSION: 1. Successful mechanical thrombectomy for treatment of a proximal right M2/MCA inferior division branch occlusion with complete recanalization (TICI3) after 1 pass with combined aspiration and stent retriever (ARTS technique). 2. No thromboembolic or hemorrhagic complication. PLAN: 1. Patient remained intubated given a positive COVID test. 2. Bed rest with no hip  flexion for 6 hours. 3. Further management per Neurology and CCM. Electronically Signed   By: Pedro Earls M.D.   On: 09/21/2019 14:38   DG CHEST PORT 1 VIEW  Result Date: 09/21/2019 CLINICAL DATA:  Acute hypoxemic respiratory  failure.  Extubation EXAM: PORTABLE CHEST 1 VIEW COMPARISON:  04/16/2018 FINDINGS: Cardiac enlargement without heart failure. Mild scarring in the lung bases unchanged. No acute infiltrate or effusion. Single lead pacemaker lead in the right ventricle unchanged from the prior study. Thoracic dextro scoliosis. IMPRESSION: Chronic lung disease with mild scarring in the bases. No acute abnormality. Electronically Signed   By: Franchot Gallo M.D.   On: 09/21/2019 15:02   IR PERCUTANEOUS ART THROMBECTOMY/INFUSION INTRACRANIAL INC DIAG ANGIO  Result Date: 09/21/2019 INDICATION: 86 year old female with past medical history significant for thyroid disease, orthostatic hypotension, hypercholesterolemia, delusional disorder, complete heart block status post pacer, chronic congestive heart failure with a baseline modified Rankin scale 0. She presented to emergency with right gaze preference and left-sided weakness, NIHSS 10. Her last normal 9 a.m. on 09/21/2019. Head CT showed no evidence of a large territorial infarct. CT angiogram showed a proximal right M2/MCA. She was taken to our service for diagnostic cerebral angiogram and mechanical thrombectomy. EXAM: Diagnostic cerebral angiogram Mechanical thrombectomy, right MCA/M2 occlusion Flat panel head CT COMPARISON:  CT/CT angiogram of the head and neck Sep 21, 2019 MEDICATIONS: 5 mg of verapamil given intra-arterially to the right ICA. ANESTHESIA/SEDATION: The procedure was performed under general anesthesia. FLUOROSCOPY TIME:  Fluoroscopy Time: 7 minutes 48 seconds (414 mGy). COMPLICATIONS: None immediate. TECHNIQUE: Informed written consent was waved as patient was obtained unable to consent for herself and no healthcare proxy or next of kin was identified in time. Discussion with neuro hospitalist held. We agree that going forward with intervention was in the best interest of the patient. Maximal Sterile Barrier Technique was utilized including caps, mask, sterile  gowns, sterile gloves, sterile drape, hand hygiene and skin antiseptic. A timeout was performed prior to the initiation of the procedure. The right groin was prepped and draped in the usual sterile fashion. Using a micropuncture kit and the modified Seldinger technique, access was gained to the right common femoral artery and an 8 French sheath was placed. Then, an [place] balloon guide catheter was navigated over a 6 [place] Berenstein 2 and a 0.035 inch Terumo Glidewire into the aortic arch. Under fluoroscopy, the catheter was placed into the right common carotid artery and then advanced to the cervical right internal carotid artery. The Berenstein catheter and the Glidewire were removed. Frontal and lateral angiograms of the upper neck and head were obtained. FINDINGS: 1. Occlusion of the proximal right M2/MCA inferior division branch. 2. Increased tortuosity of the cervical right ICA. 3. Mild atherosclerotic changes in the right carotid bulb without hemodynamically significant stenosis. 4. Ectatic cavernous segment of the right ICA. 5. Severely hypoplastic A1/ACA segment with no significant opacification of the A2 segment. PROCEDURE: Using biplane roadmap, a catalyst 6 aspiration catheter was navigated over a phenom 21 microcatheter and a synchro support flex microguidewire into the cavernous segment of the right ICA. This phenom microcatheter was then advanced into the proximal M3/MCA inferior division branch. A 5 x 37 mm Embotrap III thrombectomy device was deployed spanning the distal M1 and the entire length of the M2 segment. The device was allowed to intercalated with the clot for 4 minutes. The microcatheter was removed under fluoroscopy. The aspiration catheter was advanced to the  level of occlusion and connected to an aspiration pump. The guiding catheter balloon was inflated and the thrombectomy device plus aspiration catheter were removed en bloc under constant aspiration. The guiding catheter  was aspirated. Frontal and lateral angiograms of the head were obtained via guide catheter contrast injection. Complete recanalization of the right MCA was seen (TICI 3). Mild device induced vasospasm was noted. Intra arterial infusion of 5 mg of verapamil was performed over 10 minutes. Follow-up angiograms showed vasospasm resolution. No embolus to new territory seen. Flat panel CT of the head was obtained and post processed in a separate workstation with concurrent attending physician supervision. Selected images were sent to PACS. No evidence of hemorrhagic complication seen. A right common femoral artery angiogram was obtained with right anterior oblique view. The puncture is at the level of the common femoral artery. No significant atherosclerotic disease, plaques are stenosis seen. The femoral sheath was exchanged over the wire for a 6 French Perclose ProGlide which was utilized for access closure. Immediate hemostasis was achieved. IMPRESSION: 1. Successful mechanical thrombectomy for treatment of a proximal right M2/MCA inferior division branch occlusion with complete recanalization (TICI3) after 1 pass with combined aspiration and stent retriever (ARTS technique). 2. No thromboembolic or hemorrhagic complication. PLAN: 1. Patient remained intubated given a positive COVID test. 2. Bed rest with no hip flexion for 6 hours. 3. Further management per Neurology and CCM. Electronically Signed   By: Pedro Earls M.D.   On: 09/21/2019 14:38   CT HEAD CODE STROKE WO CONTRAST  Result Date: 09/21/2019 CLINICAL DATA:  Code stroke.  Confusion, left-sided weakness EXAM: CT HEAD WITHOUT CONTRAST TECHNIQUE: Contiguous axial images were obtained from the base of the skull through the vertex without intravenous contrast. COMPARISON:  CT head 04/02/2019 FINDINGS: Brain: Moderate atrophy. Patchy white matter hypodensity bilaterally consistent with chronic microvascular ischemia. Negative for acute infarct,  hemorrhage, mass Vascular: Hyperdense right MCA in the sylvian fissure involving M2 branches. This is consistent with acute thrombus and was not present previously. No other acute vascular abnormality. Skull: Negative Sinuses/Orbits: Prior sinus surgery. Mucosal edema in the paranasal sinuses. No orbital lesion.  Bilateral cataract extraction. Other: None ASPECTS ([place] Stroke Program Early CT Score) - Ganglionic level infarction (caudate, lentiform nuclei, internal capsule, insula, M1-M3 cortex): 7 - Supraganglionic infarction (M4-M6 cortex): 3 Total score (0-10 with 10 being normal): 10 IMPRESSION: 1. Hyperdense right M2 branches compatible with acute thrombus. Negative for acute infarct or hemorrhage 2. ASPECTS is 10 3. Preliminary results texted to Dr. Lucretia Kern Electronically Signed   By: Franchot Gallo M.D.   On: 09/21/2019 10:17   CT ANGIO HEAD CODE STROKE  Result Date: 09/21/2019 CLINICAL DATA:  86 year old female code stroke presentation with left side weakness. Hyperdense right MCA M2 branches on plain head CT 1008 hours today. EXAM: CT ANGIOGRAPHY HEAD AND NECK TECHNIQUE: Multidetector CT imaging of the head and neck was performed using the standard protocol during bolus administration of intravenous contrast. Multiplanar CT image reconstructions and MIPs were obtained to evaluate the vascular anatomy. Carotid stenosis measurements (when applicable) are obtained utilizing NASCET criteria, using the distal internal carotid diameter as the denominator. CONTRAST:  39m OMNIPAQUE IOHEXOL 350 MG/ML SOLN COMPARISON:  plain head CT 1008 hours today. FINDINGS: CTA NECK Skeleton: Widespread cervical spine degeneration. 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 Left TMJ degeneration. No acute osseous abnormality identified. Upper chest: Left-side cardiac pacemaker type device. Otherwise negative. Other neck: Small bilateral mixed density thyroid  nodules do not meet size criteria for  follow-up (ref: J Am Coll Radiol. 2015 Feb;12(2): 143-50).No acute findings. Aortic arch: 3 vessel arch configuration. Mild for age arch atherosclerosis. Right carotid system: Tortuous brachiocephalic artery and proximal right CCA with mild plaque but no stenosis. Mild calcified plaque at the right ICA origin, soft and calcified plaque at the bulb. But no associated stenosis. Mildly tortuous right ICA. Left carotid system: Tortuous proximal left CCA without stenosis. Mild calcified plaque at the left ICA origin and bulb without stenosis. Ectatic left ICA just below the skull base. Vertebral arteries: Mildly tortuous proximal right subclavian artery with no plaque or stenosis. Normal right vertebral artery origin. Tortuous right V1 segment. The right vertebral is tortuous throughout the neck but patent to the skull base without stenosis. Normal proximal left subclavian artery and left vertebral artery origin. Tortuous left vertebral artery in the neck with no significant plaque or stenosis. CTA HEAD Posterior circulation: Mildly dominant right V4 segment. No distal vertebral plaque or stenosis. Patent PICA origins. Tortuous vertebrobasilar junction is patent without stenosis. The basilar artery is patent but diminutive, functionally terminating in the SCA is. Fetal type bilateral PCA origins. Tortuous posterior communicating arteries. But bilateral PCA branches are within normal limits. Anterior circulation: Both ICA siphons are dolichoectatic, up to 7 mm diameter on the right and nearly 9 mm diameter on the left (series 9, image 110). Both siphons are patent with mild to moderate calcified plaque but no siphon stenosis. Normal ophthalmic and posterior communicating artery origins. Patent mildly ectatic carotid termini. Dominant left and diminutive or absent right ACA A1 segments. Anterior communicating artery and bilateral ACA branches are within normal limits. Left MCA M1 segment and bifurcation are patent without  stenosis. Left MCA branches are within normal limits. Right MCA M1 segment bifurcates slightly early without stenosis (series 11, image 17 and series 10, image 18). However, there is occlusion of the dominant right M2 branch which initially courses anteriorly. See series 10, image 18. And this occlusion appears to occur just proximal to the bifurcation to M3 branches (series 9, image 61). Intermediate appearing right MCA branch reconstitution on these early phase images. Venous sinuses: Pain. Anatomic variants: Dominant left and diminutive or absent right ACA A1 segments. Dominant right vertebral artery and diminutive basilar on the basis of fetal PCA origins. Review of the MIP images confirms the above findings IMPRESSION: 1. 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 2. No other arterial stenosis in the head or neck. Dolichoectatic bilateral ICA siphons measuring 7-9 mm diameter. Bilateral fetal PCA origins. Mild for age proximal ICA atherosclerosis. Preliminary results of #1 were communicated to Dr. Lorraine Lax at 10:32 am on 09/21/2019 by text page via the [facility] messaging system. Electronically Signed   By: Genevie Ann M.D.   On: 09/21/2019 10:41   CT ANGIO NECK CODE STROKE  Result Date: 09/21/2019 CLINICAL DATA:  86 year old female code stroke presentation with left side weakness. Hyperdense right MCA M2 branches on plain head CT 1008 hours today. EXAM: CT ANGIOGRAPHY HEAD AND NECK TECHNIQUE: Multidetector CT imaging of the head and neck was performed using the standard protocol during bolus administration of intravenous contrast. Multiplanar CT image reconstructions and MIPs were obtained to evaluate the vascular anatomy. Carotid stenosis measurements (when applicable) are obtained utilizing NASCET criteria, using the distal internal carotid diameter as the denominator. CONTRAST:  51m OMNIPAQUE IOHEXOL 350 MG/ML SOLN COMPARISON:  plain head CT 1008 hours  today.  FINDINGS: CTA NECK Skeleton: Widespread cervical spine degeneration. 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 Left TMJ degeneration. No acute osseous abnormality identified. Upper chest: Left-side cardiac pacemaker type device. Otherwise negative. Other neck: Small bilateral mixed density thyroid nodules do not meet size criteria for follow-up (ref: J Am Coll Radiol. 2015 Feb;12(2): 143-50).No acute findings. Aortic arch: 3 vessel arch configuration. Mild for age arch atherosclerosis. Right carotid system: Tortuous brachiocephalic artery and proximal right CCA with mild plaque but no stenosis. Mild calcified plaque at the right ICA origin, soft and calcified plaque at the bulb. But no associated stenosis. Mildly tortuous right ICA. Left carotid system: Tortuous proximal left CCA without stenosis. Mild calcified plaque at the left ICA origin and bulb without stenosis. Ectatic left ICA just below the skull base. Vertebral arteries: Mildly tortuous proximal right subclavian artery with no plaque or stenosis. Normal right vertebral artery origin. Tortuous right V1 segment. The right vertebral is tortuous throughout the neck but patent to the skull base without stenosis. Normal proximal left subclavian artery and left vertebral artery origin. Tortuous left vertebral artery in the neck with no significant plaque or stenosis. CTA HEAD Posterior circulation: Mildly dominant right V4 segment. No distal vertebral plaque or stenosis. Patent PICA origins. Tortuous vertebrobasilar junction is patent without stenosis. The basilar artery is patent but diminutive, functionally terminating in the SCA is. Fetal type bilateral PCA origins. Tortuous posterior communicating arteries. But bilateral PCA branches are within normal limits. Anterior circulation: Both ICA siphons are dolichoectatic, up to 7 mm diameter on the right and nearly 9 mm diameter on the left (series 9, image 110). Both siphons are patent with mild  to moderate calcified plaque but no siphon stenosis. Normal ophthalmic and posterior communicating artery origins. Patent mildly ectatic carotid termini. Dominant left and diminutive or absent right ACA A1 segments. Anterior communicating artery and bilateral ACA branches are within normal limits. Left MCA M1 segment and bifurcation are patent without stenosis. Left MCA branches are within normal limits. Right MCA M1 segment bifurcates slightly early without stenosis (series 11, image 17 and series 10, image 18). However, there is occlusion of the dominant right M2 branch which initially courses anteriorly. See series 10, image 18. And this occlusion appears to occur just proximal to the bifurcation to M3 branches (series 9, image 61). Intermediate appearing right MCA branch reconstitution on these early phase images. Venous sinuses: Pain. Anatomic variants: Dominant left and diminutive or absent right ACA A1 segments. Dominant right vertebral artery and diminutive basilar on the basis of fetal PCA origins. Review of the MIP images confirms the above findings IMPRESSION: 1. 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 2. No other arterial stenosis in the head or neck. Dolichoectatic bilateral ICA siphons measuring 7-9 mm diameter. Bilateral fetal PCA origins. Mild for age proximal ICA atherosclerosis. Preliminary results of #1 were communicated to Dr. Lorraine Lax at 10:32 am on 09/21/2019 by text page via the [facility] messaging system. Electronically Signed   By: Genevie Ann M.D.   On: 09/21/2019 10:41    Labs:  CBC: Recent Labs    10/04/18 0726 04/02/19 1754 09/21/19 1000 09/21/19 1005  WBC 7.6 7.1 9.1  --   HGB 13.7 13.3 13.6 14.3  HCT 42.4 43.3 42.8 42.0  PLT 212 250 233  --     COAGS: Recent Labs    09/21/19 1000  INR 1.0  APTT 33    BMP: Recent  Labs    10/04/18 0726 10/04/18 0726 11/30/18 1205 11/30/18 1205 04/02/19 1754 09/17/19 1218  09/21/19 1000 09/21/19 1005  NA 142   < > 143   < > 143 140 142 143  K 3.5   < > 3.6   < > 3.3* 3.4* 3.6 3.5  CL 112*   < > 106   < > 107 108 108 107  CO2 23   < > 24  --  26 26 25   --   GLUCOSE 97   < > 78   < > 95 90 92 89  BUN 16   < > 19   < > 15 20 18 19   CALCIUM 8.7*   < > 9.6  --  8.7* 9.0 9.0  --   CREATININE 1.06*   < > 1.02   < > 1.15* 1.06 1.23* 1.20*  GFRNONAA 47*  --   --   --  42*  --  39*  --   GFRAA 54*  --   --   --  49*  --  45*  --    < > = values in this interval not displayed.    LIVER FUNCTION TESTS: Recent Labs    10/04/18 0726 04/02/19 1754 09/21/19 1000  BILITOT 0.7 0.7 0.8  AST 13* 17 17  ALT 11 12 12   ALKPHOS 81 96 84  PROT 5.8* 7.4 6.9  ALBUMIN 3.2* 3.8 3.6    Assessment and Plan:  History of acute CVA s/p cerebral arteriogram with emergent mechanical thrombectomy of right MCA M2 occlusion achieving a TICI 3 revascularization via right femoral approach 09/21/2019 by Dr. Karenann Cai. Patient's condition stable- no focal neurologic symptoms noted, can spontaneously move all extremities. Right groin incision stable, distal pulses (DPs) 1+ bilaterally. For CT head today. Further plans per neurology- appreciate and agree with management. Please call NIR with questions/concerns.   Electronically Signed: Earley Abide, PA-C 09/22/2019, 10:32 AM   I spent a total of 25 Minutes at the the patient's bedside AND on the patient's hospital floor or unit, greater than 50% of which was counseling/coordinating care for CVA s/p revascularization.

## 2019-09-22 NOTE — Evaluation (Deleted)
Physical Therapy Evaluation Patient Details Name: [name] MRN: [id number] DOB: 1927-10-22 Today's Date: 09/22/2019   History of Present Illness  [name] is a 86 y.o. female with history of thyroid disease, orthostatic hypotension, hypercholesterolemia, delusional disorder, complete heart block status post pacer, chronic congestive heart failure.  Per EMS patient was at [facility] prescription.  At 9:30, the noted that she became confused with decreased movement of the left arm and leg.  Pt was lowered to the ground and noted to have right gaze preference.  Imaging showed M2 occlusion.  Pt was revascularized successfully in IR.  Clinical Impression  Pt admitted with/for signs of stroke with Left sided weakness and taken to IR and revascularized successfully.  Pt now at a min assist to supervision level.  Hopefully pt will be able to go straight back home, but pt is will to go to rehab if needed.  Pt currently limited functionally due to the problems listed below.  (see problems list.)  Pt will benefit from PT to maximize function and safety to be able to get home safely with available assist.     Follow Up Recommendations Home health PT;Supervision - Intermittent    Equipment Recommendations  None recommended by PT    Recommendations for Other Services       Precautions / Restrictions Precautions Precautions: Fall Restrictions Weight Bearing Restrictions: No      Mobility  Bed Mobility Overal bed mobility: Needs Assistance Bed Mobility: Supine to Sit     Supine to sit: Supervision     General bed mobility comments: no assist needed coming up and forward with HOB raised.  Transfers Overall transfer level: Needs assistance   Transfers: Sit to/from Stand;Stand Pivot Transfers Sit to Stand: Min guard Stand pivot transfers: Min assist Squat pivot transfers: Min assist     General transfer comment: steadying assist to take several steps to  chair  Ambulation/Gait Ambulation/Gait assistance: Min assist;+2 physical assistance Gait Distance (Feet): 60 Feet Assistive device: 2 person hand held assist Gait Pattern/deviations: Step-through pattern     General Gait Details: short mildly unsteady and guarded steps  Stairs            Wheelchair Mobility    Modified Rankin (Stroke Patients Only)       Balance Overall balance assessment: Needs assistance   Sitting balance-Leahy Scale: Good       Standing balance-Leahy Scale: Poor Standing balance comment: requires B UE support for dynamic balance                             Pertinent Vitals/Pain Pain Assessment: No/denies pain    Home Living Family/patient expects to be discharged to:: Private residence Living Arrangements: Alone Available Help at Discharge: Other (Comment)(POA PRN) Type of Home: House Home Access: Stairs to enter Entrance Stairs-Rails: Right;Left Entrance Stairs-Number of Steps: 4 Home Layout: One level Home Equipment: Walker - 2 wheels;Cane - single point      Prior Function Level of Independence: Needs assistance   Gait / Transfers Assistance Needed: used RW frequently in the home.  cane consistently outside and during errand running.  Drove to get groceries.  ADL's / Homemaking Assistance Needed: sponge baths at the sink, and did other ADL and IADL        Hand Dominance   Dominant Hand: Right    Extremity/Trunk Assessment   Upper Extremity Assessment Upper Extremity Assessment: Overall [facility]  for tasks assessed(L UE slightly weaker than R, pt states that it is baseline)    Lower Extremity Assessment Lower Extremity Assessment: Defer to PT evaluation    Cervical / Trunk Assessment Cervical / Trunk Assessment: Normal  Communication   Communication: No difficulties  Cognition Arousal/Alertness: Awake/alert Behavior During Therapy: WFL for tasks assessed/performed Overall Cognitive Status: Within Functional  Limits for tasks assessed                                        General Comments      Exercises     Assessment/Plan    PT Assessment Patient needs continued PT services  PT Problem List Decreased strength;Decreased activity tolerance;Decreased mobility;Decreased balance;Decreased knowledge of use of DME       PT Treatment Interventions DME instruction;Gait training;Stair training;Functional mobility training;Therapeutic activities;Balance training;Patient/family education    PT Goals (Current goals can be found in the Care Plan section)  Acute Rehab PT Goals Patient Stated Goal: return home PT Goal Formulation: With patient Time For Goal Achievement: 09/29/19 Potential to Achieve Goals: Good    Frequency Min 3X/week   Barriers to discharge        Co-evaluation PT/OT/SLP Co-Evaluation/Treatment: Yes Reason for Co-Treatment: For patient/therapist safety PT goals addressed during session: Mobility/safety with mobility OT goals addressed during session: ADL's and self-care       AM-PAC PT "6 Clicks" Mobility  Outcome Measure Help needed turning from your back to your side while in a flat bed without using bedrails?: None Help needed moving from lying on your back to sitting on the side of a flat bed without using bedrails?: None Help needed moving to and from a bed to a chair (including a wheelchair)?: A Little Help needed standing up from a chair using your arms (e.g., wheelchair or bedside chair)?: A Little Help needed to walk in hospital room?: A Little Help needed climbing 3-5 steps with a railing? : A Little 6 Click Score: 20    End of Session   Activity Tolerance: Patient tolerated treatment well;Patient limited by fatigue Patient left: in chair;with call bell/phone within reach;with bed alarm set Nurse Communication: Mobility status PT Visit Diagnosis: Unsteadiness on feet (R26.81);Difficulty in walking, not elsewhere classified (R26.2);Other  symptoms and signs involving the nervous system [id number])    Time: [id number] PT Time Calculation (min) (ACUTE ONLY): 32 min   Charges:   PT Evaluation $PT Eval Moderate Complexity: 1 Mod          09/22/2019  Ginger Carne., PT Acute Rehabilitation Services [phone]  (pager) [phone]  (office)  Tessie Fass Olanda Boughner 09/22/2019, 5:30 PM

## 2019-09-22 NOTE — Evaluation (Signed)
Occupational Therapy Evaluation Patient Details Name: [name] MRN: [id number] DOB: 13-May-1927 Today's Date: 09/22/2019    History of Present Illness [name] is a 86 y.o. female with history of thyroid disease, orthostatic hypotension, hypercholesterolemia, delusional disorder, complete heart block status post pacer, chronic congestive heart failure.  Per EMS [facility].  At 9:30, the noted that she became confused with decreased movement of the left arm and leg.  Pt was lowered to the ground and noted to have right gaze preference.  Imaging showed M2 occlusion.  Pt was revascularized successfully in IR.   Clinical Impression   Pt functions modified independently with RW or cane at her baseline. She drives. Pt typically sponge bathes and goes to the hair salon weekly. Pt currently requires up to min assist for ADL and mobilized with B hand held assist in hall. Pt with Sp02 of 91-93% on RA at end of walk. Will follow acutely, anticipate pt will be able to return home, but she is willing to go to rehab if she does not progress as expected.     Follow Up Recommendations  Home health OT    Equipment Recommendations  None recommended by OT    Recommendations for Other Services       Precautions / Restrictions Precautions Precautions: Fall Restrictions Weight Bearing Restrictions: No      Mobility Bed Mobility Overal bed mobility: Needs Assistance Bed Mobility: Supine to Sit     Supine to sit: Supervision     General bed mobility comments: no assist needed coming up and forward with HOB raised.  Transfers Overall transfer level: Needs assistance   Transfers: Sit to/from Stand;Stand Pivot Transfers Sit to Stand: Min guard Stand pivot transfers: Min assist Squat pivot transfers: Min assist     General transfer comment: steadying assist to take several steps to chair    Balance Overall balance assessment: Needs assistance   Sitting balance-Leahy Scale: Good       Standing balance-Leahy Scale: Poor Standing balance comment: requires B UE support for dynamic balance                           ADL either performed or assessed with clinical judgement   ADL Overall ADL's : Needs assistance/impaired Eating/Feeding: Independent;Sitting   Grooming: Wash/dry hands;Wash/dry face;Sitting;Set up   Upper Body Bathing: Set up;Sitting   Lower Body Bathing: Minimal assistance;Sit to/from stand   Upper Body Dressing : Set up;Sitting   Lower Body Dressing: Minimal assistance;Sit to/from stand Lower Body Dressing Details (indicate cue type and reason): assisted for to start L sock over toes Toilet Transfer: +2 for physical assistance;Minimal assistance;Ambulation   Toileting- Clothing Manipulation and Hygiene: Minimal assistance;Sit to/from stand       Functional mobility during ADLs: +2 for physical assistance;Minimal assistance(hand held assist)       Vision Patient Visual Report: No change from baseline       Perception     Praxis      Pertinent Vitals/Pain Pain Assessment: No/denies pain     Hand Dominance Right   Extremity/Trunk Assessment Upper Extremity Assessment Upper Extremity Assessment: Overall WFL for tasks assessed(L UE slightly weaker than R, pt states that it is baseline)   Lower Extremity Assessment Lower Extremity Assessment: Defer to PT evaluation   Cervical / Trunk Assessment Cervical / Trunk Assessment: Normal   Communication Communication Communication: No difficulties   Cognition Arousal/Alertness:  Awake/alert Behavior During Therapy: WFL for tasks assessed/performed Overall Cognitive Status: Within Functional Limits for tasks assessed                                     General Comments       Exercises     Shoulder Instructions      Home Living Family/patient expects to be discharged to:: Private residence Living Arrangements:  Alone Available Help at Discharge: Other (Comment)(POA PRN) Type of Home: House Home Access: Stairs to enter [employer] of Steps: 4 Entrance Stairs-Rails: Right;Left Home Layout: One level     Bathroom Shower/Tub: Other (comment)(sponge bathes)   Bathroom Toilet: Standard     Home Equipment: Walker - 2 wheels;Cane - single point          Prior Functioning/Environment Level of Independence: Needs assistance  Gait / Transfers Assistance Needed: used RW frequently in the home.  cane consistently outside and during errand running.  Drove to get groceries. ADL's / Homemaking Assistance Needed: sponge baths at the sink, and did other ADL and IADL            OT Problem List: Impaired balance (sitting and/or standing);Decreased knowledge of use of DME or AE      OT Treatment/Interventions: Self-care/ADL training;DME and/or AE instruction;Patient/family education;Balance training;Therapeutic activities    OT Goals(Current goals can be found in the care plan section) Acute Rehab OT Goals Patient Stated Goal: return home OT Goal Formulation: With patient Time For Goal Achievement: 10/06/19 Potential to Achieve Goals: Good ADL Goals Pt Will Perform Grooming: with modified independence;standing Pt Will Perform Lower Body Bathing: with modified independence;sit to/from stand Pt Will Perform Lower Body Dressing: with modified independence;sit to/from stand Pt Will Transfer to Toilet: with modified independence;ambulating;regular height toilet Pt Will Perform Toileting - Clothing Manipulation and hygiene: with modified independence;sit to/from stand Additional ADL Goal #1: Pt will gather items necessary for ADL around her room modified independently.  OT Frequency: Min 2X/week   Barriers to D/C:            Co-evaluation PT/OT/SLP Co-Evaluation/Treatment: Yes Reason for Co-Treatment: For patient/therapist safety PT goals addressed during session: Mobility/safety  with mobility OT goals addressed during session: ADL's and self-care      AM-PAC OT "6 Clicks" Daily Activity     Outcome Measure Help from another person eating meals?: None Help from another person taking care of personal grooming?: A Little Help from another person toileting, which includes using toliet, bedpan, or urinal?: A Little Help from another person bathing (including washing, rinsing, drying)?: A Little Help from another person to put on and taking off regular upper body clothing?: None Help from another person to put on and taking off regular lower body clothing?: A Little 6 Click Score: 20   End of Session Nurse Communication: Mobility status  Activity Tolerance: Patient tolerated treatment well Patient left: in chair;with call bell/phone within reach;with nursing/sitter in room  OT Visit Diagnosis: Other abnormalities of gait and mobility (R26.89)                Time: 1210-1229 OT Time Calculation (min): 19 min Charges:  OT General Charges $OT Visit: 1 Visit OT Evaluation $OT Eval Moderate Complexity: 1 Mod  Nestor Lewandowsky, OTR/L Acute Rehabilitation Services Pager: [phone] Office: [phone]  Malka So 09/22/2019, 3:54 PM

## 2019-09-22 NOTE — Evaluation (Signed)
Physical Therapy Evaluation Patient Details Name: [name] MRN: [id number] DOB: October 06, 1927 Today's Date: 09/22/2019   History of Present Illness  [name] is a 86 y.o. female with history of thyroid disease, orthostatic hypotension, hypercholesterolemia, delusional disorder, complete heart block status post pacer, chronic congestive heart failure.  Per EMS [facility].  At 9:30, the noted that she became confused with decreased movement of the left arm and leg.  Pt was lowered to the ground and noted to have right gaze preference.  Imaging showed M2 occlusion.  Pt was revascularized successfully in IR.  Clinical Impression  Pt admitted with/for signs of stroke with L sided weakness.  Pt was successfully revascularized.  Now needing minimal to supervision assist.  Pt currently limited functionally due to the problems listed below.  (see problems list.)  Pt will benefit from PT to maximize function and safety to be able to get home safely with limited available assist.     Follow Up Recommendations Home health PT;Supervision - Intermittent    Equipment Recommendations  None recommended by PT    Recommendations for Other Services       Precautions / Restrictions Precautions Precautions: Fall Restrictions Weight Bearing Restrictions: No      Mobility  Bed Mobility Overal bed mobility: Needs Assistance Bed Mobility: Supine to Sit     Supine to sit: Supervision     General bed mobility comments: no assist needed coming up and forward with HOB raised.  Transfers Overall transfer level: Needs assistance   Transfers: Sit to/from Stand;Stand Pivot Transfers Sit to Stand: Min guard Stand pivot transfers: Min assist Squat pivot transfers: Min assist     General transfer comment: steadying assist to take several steps to chair  Ambulation/Gait Ambulation/Gait assistance: Min assist;+2 physical assistance Gait Distance (Feet): 60  Feet Assistive device: 2 person hand held assist Gait Pattern/deviations: Step-through pattern     General Gait Details: short mildly unsteady and guarded steps  Stairs            Wheelchair Mobility    Modified Rankin (Stroke Patients Only)       Balance Overall balance assessment: Needs assistance   Sitting balance-Leahy Scale: Good       Standing balance-Leahy Scale: Poor Standing balance comment: requires B UE support for dynamic balance                             Pertinent Vitals/Pain Pain Assessment: No/denies pain    Home Living Family/patient expects to be discharged to:: Private residence Living Arrangements: Alone Available Help at Discharge: Other (Comment)(POA PRN) Type of Home: House Home Access: Stairs to enter Entrance Stairs-Rails: Right;Left Entrance Stairs-Number of Steps: 4 Home Layout: One level Home Equipment: Walker - 2 wheels;Cane - single point      Prior Function Level of Independence: Needs assistance   Gait / Transfers Assistance Needed: used RW frequently in the home.  cane consistently outside and during errand running.  Drove to get groceries.  ADL's / Homemaking Assistance Needed: sponge baths at the sink, and did other ADL and IADL        Hand Dominance   Dominant Hand: Right    Extremity/Trunk Assessment   Upper Extremity Assessment Upper Extremity Assessment: Overall WFL for tasks assessed(L UE slightly weaker than R, pt states that it is baseline)    Lower Extremity Assessment Lower Extremity Assessment: Defer to  PT evaluation    Cervical / Trunk Assessment Cervical / Trunk Assessment: Normal  Communication   Communication: No difficulties  Cognition Arousal/Alertness: Awake/alert Behavior During Therapy: WFL for tasks assessed/performed Overall Cognitive Status: Within Functional Limits for tasks assessed                                        General Comments       Exercises     Assessment/Plan    PT Assessment Patient needs continued PT services  PT Problem List Decreased strength;Decreased activity tolerance;Decreased mobility;Decreased balance;Decreased knowledge of use of DME       PT Treatment Interventions DME instruction;Gait training;Stair training;Functional mobility training;Therapeutic activities;Balance training;Patient/family education    PT Goals (Current goals can be found in the Care Plan section)  Acute Rehab PT Goals Patient Stated Goal: return home PT Goal Formulation: With patient Time For Goal Achievement: 09/29/19 Potential to Achieve Goals: Good    Frequency Min 3X/week   Barriers to discharge        Co-evaluation PT/OT/SLP Co-Evaluation/Treatment: Yes Reason for Co-Treatment: For patient/therapist safety PT goals addressed during session: Mobility/safety with mobility OT goals addressed during session: ADL's and self-care       AM-PAC PT "6 Clicks" Mobility  Outcome Measure Help needed turning from your back to your side while in a flat bed without using bedrails?: None Help needed moving from lying on your back to sitting on the side of a flat bed without using bedrails?: None Help needed moving to and from a bed to a chair (including a wheelchair)?: A Little Help needed standing up from a chair using your arms (e.g., wheelchair or bedside chair)?: A Little Help needed to walk in hospital room?: A Little Help needed climbing 3-5 steps with a railing? : A Little 6 Click Score: 20    End of Session   Activity Tolerance: Patient tolerated treatment well;Patient limited by fatigue Patient left: in chair;with call bell/phone within reach;with bed alarm set Nurse Communication: Mobility status PT Visit Diagnosis: Unsteadiness on feet (R26.81);Difficulty in walking, not elsewhere classified (R26.2);Other symptoms and signs involving the nervous system [id number])    Time: [id number] PT Time Calculation (min)  (ACUTE ONLY): 32 min   Charges:   PT Evaluation $PT Eval Moderate Complexity: 1 Mod          09/22/2019  Ginger Carne., PT Acute Rehabilitation Services [phone]  (pager) [phone]  (office)  Tessie Fass Brighton Pilley 09/22/2019, 5:25 PM

## 2019-09-22 NOTE — Progress Notes (Signed)
S: Patient admitted to the ICU for acute CVA to the stroke team (now sp thrombectomy), her covid test was positive.   Medically treated for viral pneumonia by the PCCM team, now patient coming out of the unit to progressive care.   [facility] will continue to follow medically starting 05/27 as consultants (primary continue to be neurology).   O: RR: 19  Pulse oxymetry: 91 to 94%  Fi02: room air.   Chest film (personally reviewed): with faint interstitial infiltrate at the right base.   P: I will order inflammatory markers as part of management of SARS Covid 19 viral pneumonia.   Continue with dexamethasone and Remdesivir.

## 2019-09-22 NOTE — Progress Notes (Signed)
  Echocardiogram 2D Echocardiogram has been performed.  Michiel Cowboy 09/22/2019, 11:46 AM

## 2019-09-22 NOTE — Progress Notes (Signed)
STROKE TEAM PROGRESS NOTE   INTERVAL HISTORY Patient presented with left hemiparesis and right gaze with deviation due to right M2 occlusion and underwent successful treatment with IV TPA followed by thrombectomy and is doing extremely well.  She was extubated.  She was found to be Covid positive but denies any symptoms of active Covid infection.  She in fact has received the first dose of Covid vaccine 3 weeks ago and was due for the second dose within a few days.  She is sitting up in the bed comfortable and has no deficits.  Vital signs are stable.  Vitals:   09/22/19 1100 09/22/19 1129 09/22/19 1200 09/22/19 1300  BP: 116/66  119/72   Pulse: 61  71 61  Resp: 18  (!) 21 16  Temp:  98 F (36.7 C)    TempSrc:  Oral    SpO2: 93%  92% 95%  Weight:      Height:        CBC:  Recent Labs  Lab 09/21/19 1000 09/21/19 1005  WBC 9.1  --   NEUTROABS 6.3  --   HGB 13.6 14.3  HCT 42.8 42.0  MCV 97.7  --   PLT 233  --     Basic Metabolic Panel:  Recent Labs  Lab 09/17/19 1218 09/17/19 1218 09/21/19 1000 09/21/19 1005  NA 140   < > 142 143  K 3.4*   < > 3.6 3.5  CL 108   < > 108 107  CO2 26  --  25  --   GLUCOSE 90   < > 92 89  BUN 20   < > 18 19  CREATININE 1.06   < > 1.23* 1.20*  CALCIUM 9.0  --  9.0  --    < > = values in this interval not displayed.   Lipid Panel:     Component Value Date/Time   CHOL 156 09/22/2019 0848   TRIG 45 09/22/2019 0848   HDL 58 09/22/2019 0848   CHOLHDL 2.7 09/22/2019 0848   VLDL 9 09/22/2019 0848   LDLCALC 89 09/22/2019 0848   HgbA1c:  Lab Results  Component Value Date   HGBA1C 6.1 (H) 09/22/2019   Urine Drug Screen:     Component Value Date/Time   LABOPIA NONE DETECTED 05/05/2016 0246   COCAINSCRNUR NONE DETECTED 05/05/2016 0246   LABBENZ NONE DETECTED 05/05/2016 0246   AMPHETMU NONE DETECTED 05/05/2016 0246   THCU NONE DETECTED 05/05/2016 0246   LABBARB NONE DETECTED 05/05/2016 0246    Alcohol Level     Component Value  Date/Time   ETH <10 04/02/2019 1752    IMAGING past 24 hours ECHOCARDIOGRAM COMPLETE  Result Date: 09/22/2019    ECHOCARDIOGRAM REPORT   Patient Name:   Nichole REEFER Date of Exam: 09/22/2019 Medical Rec #:  [id number]        Height:       67.0 in Accession #:    [id number]       Weight:       213.0 lb Date of Birth:  1927-07-22       BSA:          2.077 m Patient Age:    86 years         BP:           116/61 mmHg Patient Gender: F                HR:  63 bpm. Exam Location:  Inpatient Procedure: 2D Echo, Cardiac Doppler and Color Doppler Indications:    Stroke 434.91 / I163.9  History:        Patient has prior history of Echocardiogram examinations, most                 recent 12/30/2012. CHF, CAD, Pacemaker, Stroke; Risk                 Factors:Dyslipidemia and Non-Smoker. Breast cancer.  Sonographer:    Vickie Epley RDCS Referring Phys: [place] Comments: Covid positive. IMPRESSIONS  1. Left ventricular ejection fraction, by estimation, is 55 to 60%. The left ventricle has normal function. The left ventricle has no regional wall motion abnormalities. Left ventricular diastolic function could not be evaluated.  2. Right ventricular systolic function is normal. The right ventricular size is normal. There is mildly elevated pulmonary artery systolic pressure. The estimated right ventricular systolic pressure is 99991111 mmHg.  3. The mitral valve is grossly normal. No evidence of mitral valve regurgitation. No evidence of mitral stenosis.  4. The aortic valve is tricuspid. Aortic valve regurgitation is not visualized. Mild aortic valve sclerosis is present, with no evidence of aortic valve stenosis.  5. The inferior vena cava is dilated in size with <50% respiratory variability, suggesting right atrial pressure of 15 mmHg. FINDINGS  Left Ventricle: Left ventricular ejection fraction, by estimation, is 55 to 60%. The left ventricle has normal function. The left ventricle has no  regional wall motion abnormalities. The left ventricular internal cavity size was normal in size. There is  no left ventricular hypertrophy. Abnormal (paradoxical) septal motion, consistent with RV pacemaker. Left ventricular diastolic function could not be evaluated due to paced rhythm. Left ventricular diastolic function could not be evaluated. Right Ventricle: The right ventricular size is normal. No increase in right ventricular wall thickness. Right ventricular systolic function is normal. There is mildly elevated pulmonary artery systolic pressure. The tricuspid regurgitant velocity is 2.65  m/s, and with an assumed right atrial pressure of 15 mmHg, the estimated right ventricular systolic pressure is 99991111 mmHg. Left Atrium: Left atrial size was normal in size. Right Atrium: Right atrial size was normal in size. Pericardium: Trivial pericardial effusion is present. Presence of pericardial fat pad. Mitral Valve: The mitral valve is grossly normal. No evidence of mitral valve regurgitation. No evidence of mitral valve stenosis. Tricuspid Valve: The tricuspid valve is grossly normal. Tricuspid valve regurgitation is trivial. No evidence of tricuspid stenosis. Aortic Valve: The aortic valve is tricuspid. Aortic valve regurgitation is not visualized. Mild aortic valve sclerosis is present, with no evidence of aortic valve stenosis. Pulmonic Valve: The pulmonic valve was grossly normal. Pulmonic valve regurgitation is trivial. No evidence of pulmonic stenosis. Aorta: The aortic root is normal in size and structure. Venous: The inferior vena cava is dilated in size with less than 50% respiratory variability, suggesting right atrial pressure of 15 mmHg. IAS/Shunts: The atrial septum is grossly normal. Additional Comments: A pacer wire is visualized in the right atrium and right ventricle.  LEFT VENTRICLE PLAX 2D LVIDd:         5.10 cm LV PW:         1.05 cm LV IVS:        0.82 cm LVOT diam:     2.40 cm LV SV:          89 LV SV Index:   43 LVOT Area:  4.52 cm  LV Volumes (MOD) LV vol d, MOD A2C: 66.5 ml LV vol d, MOD A4C: 70.6 ml LV vol s, MOD A2C: 46.7 ml LV vol s, MOD A4C: 39.5 ml LV SV MOD A2C:     19.8 ml LV SV MOD A4C:     70.6 ml LV SV MOD BP:      27.0 ml RIGHT VENTRICLE TAPSE (M-mode): 1.2 cm LEFT ATRIUM             Index       RIGHT ATRIUM           Index LA Vol (A2C):   38.3 ml 18.44 ml/m RA Area:     15.30 cm LA Vol (A4C):   44.8 ml 21.57 ml/m RA Volume:   36.10 ml  17.38 ml/m LA Biplane Vol: 41.8 ml 20.13 ml/m  AORTIC VALVE LVOT Vmax:   97.30 cm/s LVOT Vmean:  64.700 cm/s LVOT VTI:    0.197 m  AORTA Ao Root diam: 3.10 cm TRICUSPID VALVE TR Peak grad:   28.1 mmHg TR Vmax:        265.00 cm/s  SHUNTS Systemic VTI:  0.20 m Systemic Diam: 2.40 cm Eleonore Chiquito MD Electronically signed by Eleonore Chiquito MD Signature Date/Time: 09/22/2019/2:47:33 PM    Final     PHYSICAL EXAM Pleasant elderly Caucasian lady sitting up comfortably in a chair.  Not in distress. . Afebrile. Head is nontraumatic. Neck is supple without bruit.    Cardiac exam no murmur or gallop. Lungs are clear to auscultation. Distal pulses are well felt. Neurological Exam ;  Awake  Alert oriented x 3. Normal speech and language.eye movements full without nystagmus.fundi were not visualized. Vision acuity and fields appear normal. Hearing is normal. Palatal movements are normal. Face symmetric. Tongue midline. Normal strength, tone, reflexes and coordination. Normal sensation. Gait deferred.  ASSESSMENT/PLAN [name] is a 86 y.o. female with history of thyroid disease, orthostatic hypotension, hypercholesterolemia, delusional disorder, complete heart block status post pacer, chronic congestive heart failure presenting with confusion with L arm and L leg weakness. Received IV tPA 09/21/19 at 1021. Found to have a hyperdense L M2. Taken to IR.   Stroke:  Aborted R MCA infarct s/p tPA and IR w/ TICI3 revascularization, embolic  secondary to unknown source  Code Stroke CT head hyperdense R M2. No hemorrhage. ASPECTS 10.     CTA head & neck R MCA M2 occlusion. B ICQ siphons dolichoectatic. B fetal PCAs. Mild proximal ICA atherosclerosis.  Cerebral angio proximal R M2 occlusion s/p mechanical thrombectomy w/ embotrap and aspiration. TICI3 revascularization    Post IR CT Unremarkable   CT no acute ICH, no stroke  2D Echo EF 55-60%. No source of embolus   LDL 89  HgbA1c 6.1  SCDs for VTE prophylaxis  No antithrombotic prior to admission, now on No antithrombotic within 24h of tPA.    Therapy recommendations:  pending   Disposition:  pending   Transfer to the floor  Covid Infection, asymptomatic  Received 1st dose vaccine 3 weeks ago  COVID + incidental finding  Orthostatic Hypotension  Home meds:  midodrine . BP goal per IR x 24h following IR procedure and tPA administration. Then < 180 . Long-term BP goal normotensive  Hyperlipidemia  Home meds:  No statin  Add lipitor 10 given advanced age and near goal LDL  LDL 89, goal < 70  Continue statin at discharge  Other Stroke Risk Factors  Advanced  age  Obesity, Body mass index is 33.35 kg/m., recommend weight loss, diet and exercise as appropriate   Hx Stroke  09/2018 - Incidental cerebral vermis hemorrhage, source unclear, not felt to be hypertensive.  Possibly a cavernoma.  Unable to do MRI due to incompatible pacemaker  Hx Congestive heart failure  Heart block w/ pacer  Other Active Problems  Hx B breast cancer  Thyroid disease  Delusional disorder  Hospital day # 1 Patient presented with left hemiparesis and right gaze deviation secondary to right M2 occlusion and was treated with IV TPA followed by successful mechanical thrombectomy and seems to be doing quite well and has no residual deficits.  Continue close neurological monitoring and strict blood pressure control as per post TPA and post intervention protocol.   Mobilize out of bed.  Therapy consults.  Continue ongoing stroke work-up.  Transfer to neurology floor bed later today.  No family available for discussion.  Discussed with Dr. Audria Nine critical care medicine and care team.This patient is critically ill and at significant risk of neurological worsening, death and care requires constant monitoring of vital signs, hemodynamics,respiratory and cardiac monitoring, extensive review of multiple databases, frequent neurological assessment, discussion with family, other specialists and medical decision making of high complexity.I have made any additions or clarifications directly to the above note.This critical care time does not reflect procedure time, or teaching time or supervisory time of PA/NP/Med Resident etc but could involve care discussion time.  I spent 30 minutes of neurocritical care time  in the care of  this patient.   Antony Contras, MD Medical Director [facility] Pager: [phone] 09/22/2019 7:33 PM   To contact Stroke Continuity provider, please refer to [email or web address] After hours, contact General Neurology

## 2019-09-22 NOTE — Progress Notes (Signed)
NAME:  [name], MRN:  [id number], DOB:  July 13, 1927, LOS: 1 ADMISSION DATE:  09/21/2019, CONSULTATION DATE:  09/21/19 REFERRING MD:  Dr Lorraine Lax, CHIEF COMPLAINT:  Post operative resp insuff  Brief History   86 yo cf presented after L sided weakness when standing in line to get 2nd covid vaccine injection at [facility], R MCA cva s/p thrombectomy, covid +  History of present illness   86 yo cf with pmh chf, complete hb s/p ppm, orthostatic hypotension, venous insufficiency, presented via ems after sudden onset of L sided weakness at 930am. She was noted to have R M2 occlusion given tpa and taken for thrombectomy. Post operatively pt had resp insufficiency req intubation. Upon initial assessment pt was sedated on propofol. When that was weaned pt was awake and following commands equally bilaterally. She was placed on short cpap and subsequently extubated to 4L [place]. Incidentally pt was found to be covid positive (she had completed her first covid vaccine and was in line for her second when the symptoms began).  Pt does endorse she may not have been feeling "my best" for the past few days. Endorses cough. She denies any other complaints and is not sure about the events that led her but states she felt worse after she got here and "you keep messing with me". Pt was surprised to hear about her cva and her being covid positive.   She is sating ~95% on 4L [place].    Past Medical History   Past Medical History:  Diagnosis Date  . Cataract   . Chronic congestive heart failure, unspecified heart failure type ([place]) 09/19/2019  . Complete heart block (HCC)    s/p PPM implant (MDT) by Dr Blanch Media.  Atrial lead could not be paced at time of the procedure.  She has chronic AV dysociation  . Delusional disorder ([place])   . Exogenous obesity   . Hypercholesterolemia   . Invasive ductal carcinoma of breast ([place]) 03/2007   BILATERAL BREASTS  . Orthostatic hypotension    treated with midodrine by Dr Rollene [name]  .  PONV (postoperative nausea and vomiting)   . Thyroid disease   . Venous insufficiency     Significant Hospital Events   5/25: R MCA   Consults:  PCCM  Procedures:  5/25: R M2 thrombectomy with recannulization  Significant Diagnostic Tests:  5/25 cth: 1. Hyperdense right M2 branches compatible with acute thrombus. Negative for acute infarct or hemorrhage 5/25 cta: 1. 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  2. No other arterial stenosis in the head or neck. Dolichoectatic bilateral ICA siphons measuring 7-9 mm diameter. Bilateral fetal PCA origins. Mild for age proximal ICA atherosclerosis.   Micro Data:  5/25 sars2: positive , CT value 32.3  Antimicrobials:  remdesivir  Interim history/subjective:  5/26: pt extubated yesterday to 4L. Doing ok now on RA.   Objective   Blood pressure 106/61, pulse 60, temperature 98 F (36.7 C), temperature source Axillary, resp. rate 16, height 5\' 7"  (1.702 m), weight 96.6 kg, SpO2 96 %.    Vent Mode: PRVC FiO2 (%):  [40 %] 40 % Set Rate:  [14 bmp] 14 bmp Vt Set:  [500 mL] 500 mL PEEP:  [5 cmH20] 5 cmH20 Plateau Pressure:  [18 cmH20] 18 cmH20   Intake/Output Summary (Last 24 hours) at 09/22/2019 0753 Last data filed at 09/22/2019 0700 Gross per 24 hour  Intake 1285.55 ml  Output 75 ml  Net 1210.55 ml   Filed Weights   09/21/19 1000  Weight: 96.6 kg    Examination: General: no acute distress, sitting up in bed eating breakfast HEENT: NCAT, EOMI, PERRLA, MMMP Lungs: CTA, no wheezes, rhonchi, rales Cardiovascular: RRR, no m/g/r Abdomen: soft, NT,ND, BS+ Extremities: + edema with erythema, chronic venous stasis changes, R foot wound Skin: no rashes, warm and dry Neuro: moves all 4 extremities equally with 5/5 strength GU: purewick in place  Resolved Hospital Problem list     Assessment & Plan:  Acute post operative resp insuff:  -resolved -cxr without  definitive cxr 5/25, personally reviewed by me  covid 19:  -pt states she was not feeling "her best" the past few days -has been successfully weaned from supplemental oxygen -s/p 1 dose of vaccine (with second due 5/25) d/w ID and she should remain on isolation for 10-14days in light of her minimal resp symptoms. She then should be stable for her 2nd dose of her vaccine. Second doses should be given within 6 weeks of the initial dose or as close to that as possible when the 21 day mark is not.  -in light of above will start remdesivir and decadron from 5 and 10 days respectively  RMCA CVA:  -s/p tpa and thrombectomy -per stroke team  -goal sbp <140 -PT/OT  CKD3a:  -monitor uop and indices  Orthostatic hypotension:  -on chronic midodrine H/o CHF:  -echo pending -unable to pull up old echo for systolic/diastolic Complete hb s/p ppm: -noted  Hypothyroidism:  -levothyroxine  Prediabetes:  - a1c 5/21 6.1 -glucose monitoring.  Best practice:  Diet: consistent carb Pain/Anxiety/Delirium protocol (if indicated): n/a VAP protocol (if indicated): n/a DVT prophylaxis: per neuro s/p tpa at this time GI prophylaxis: ppi Glucose control: ssi Mobility: PT/OT eval pending Code Status: FULL Family Communication: per primary Disposition: stable for transfer from ICU from ccm perspective. We will sign off at this time. Please call with any further questions.   Labs   CBC: Recent Labs  Lab 09/21/19 1000 09/21/19 1005  WBC 9.1  --   NEUTROABS 6.3  --   HGB 13.6 14.3  HCT 42.8 42.0  MCV 97.7  --   PLT 233  --     Basic Metabolic Panel: Recent Labs  Lab 09/17/19 1218 09/21/19 1000 09/21/19 1005  NA 140 142 143  K 3.4* 3.6 3.5  CL 108 108 107  CO2 26 25  --   GLUCOSE 90 92 89  BUN 20 18 19   CREATININE 1.06 1.23* 1.20*  CALCIUM 9.0 9.0  --    GFR: Estimated Creatinine Clearance: 37.9 mL/min (A) (by C-G formula based on SCr of 1.2 mg/dL (H)). Recent Labs  Lab  09/21/19 1000  WBC 9.1    Liver Function Tests: Recent Labs  Lab 09/21/19 1000  AST 17  ALT 12  ALKPHOS 84  BILITOT 0.8  PROT 6.9  ALBUMIN 3.6   No results for input(s): LIPASE, AMYLASE in the last 168 hours. No results for input(s): AMMONIA in the last 168 hours.  ABG    Component Value Date/Time   HCO3 29.0 (H) 11/19/2006 1021   TCO2 26 09/21/2019 1005     Coagulation Profile: Recent Labs  Lab 09/21/19 1000  INR 1.0    Cardiac Enzymes: No results for input(s): CKTOTAL, CKMB, CKMBINDEX, TROPONINI in the last 168 hours.  HbA1C: Hgb A1c MFr Bld  Date/Time Value Ref Range Status  10/05/2018 06:44 AM 5.8 (H) 4.8 -  5.6 % Final    Comment:    (NOTE) Pre diabetes:          5.7%-6.4% Diabetes:              >6.4% Glycemic control for   <7.0% adults with diabetes   08/11/2015 03:45 AM 6.0 (H) 4.8 - 5.6 % Final    Comment:    (NOTE)         Pre-diabetes: 5.7 - 6.4         Diabetes: >6.4         Glycemic control for adults with diabetes: <7.0     CBG: Recent Labs  Lab 09/21/19 1000 09/21/19 1607 09/21/19 1917 09/21/19 2324 09/22/19 0323  GLUCAP 86 127* 142* 180* 126*     care time 38 mins. This represents my time independent of the NP's/PA's/med students/residents time taking care of the pt. This is excluding procedures.     [facility] 09/22/2019, 7:53 AM

## 2019-09-23 ENCOUNTER — Other Ambulatory Visit: Payer: Self-pay | Admitting: Cardiology

## 2019-09-23 DIAGNOSIS — U071 COVID-19: Secondary | ICD-10-CM

## 2019-09-23 DIAGNOSIS — I639 Cerebral infarction, unspecified: Secondary | ICD-10-CM

## 2019-09-23 LAB — GLUCOSE, CAPILLARY
Glucose-Capillary: 104 mg/dL — ABNORMAL HIGH (ref 70–99)
Glucose-Capillary: 87 mg/dL (ref 70–99)
Glucose-Capillary: 95 mg/dL (ref 70–99)
Glucose-Capillary: 126 mg/dL — ABNORMAL HIGH (ref 70–99)

## 2019-09-23 LAB — COMPREHENSIVE METABOLIC PANEL
ALT: 14 U/L (ref 0–44)
AST: 17 U/L (ref 15–41)
Albumin: 3.3 g/dL — ABNORMAL LOW (ref 3.5–5.0)
Alkaline Phosphatase: 69 U/L (ref 38–126)
Anion gap: 13 (ref 5–15)
BUN: 25 mg/dL — ABNORMAL HIGH (ref 8–23)
CO2: 21 mmol/L — ABNORMAL LOW (ref 22–32)
Calcium: 8.3 mg/dL — ABNORMAL LOW (ref 8.9–10.3)
Chloride: 108 mmol/L (ref 98–111)
Creatinine, Ser: 1.35 mg/dL — ABNORMAL HIGH (ref 0.44–1.00)
GFR calc Af Amer: 40 mL/min — ABNORMAL LOW (ref >60)
GFR calc non Af Amer: 35 mL/min — ABNORMAL LOW (ref >60)
Glucose, Bld: 101 mg/dL — ABNORMAL HIGH (ref 70–99)
Potassium: 4.4 mmol/L (ref 3.5–5.1)
Sodium: 142 mmol/L (ref 135–145)
Total Bilirubin: 0.7 mg/dL (ref 0.3–1.2)
Total Protein: 6.2 g/dL — ABNORMAL LOW (ref 6.5–8.1)

## 2019-09-23 LAB — D-DIMER, QUANTITATIVE: D-Dimer, Quant: 2.46 ug/mL-FEU — ABNORMAL HIGH (ref 0.00–0.50)

## 2019-09-23 LAB — C-REACTIVE PROTEIN: CRP: 0.6 mg/dL (ref <1.0)

## 2019-09-23 LAB — FERRITIN: Ferritin: 73 ng/mL (ref 11–307)

## 2019-09-23 MED ORDER — ASPIRIN 81 MG PO TBEC: 81.0000 mg | DELAYED_RELEASE_TABLET | Freq: Every day | ORAL | Status: DC

## 2019-09-23 MED ORDER — ATORVASTATIN CALCIUM 10 MG PO TABS: 10.0000 mg | ORAL_TABLET | Freq: Every day | ORAL | 2 refills | Status: DC

## 2019-09-23 MED ORDER — LACTATED RINGERS IV SOLN: INTRAVENOUS | Status: AC

## 2019-09-23 MED ORDER — CLOPIDOGREL BISULFATE 75 MG PO TABS: 75.0000 mg | ORAL_TABLET | Freq: Every day | ORAL | 0 refills | Status: DC

## 2019-09-23 MED ORDER — CLOPIDOGREL BISULFATE 75 MG PO TABS
75.0000 mg | ORAL_TABLET | Freq: Every day | ORAL | Status: DC
  Administered 2019-09-23: 75 mg via ORAL
  Filled 2019-09-23: qty 1

## 2019-09-23 MED ORDER — ASPIRIN EC 81 MG PO TBEC
81.0000 mg | DELAYED_RELEASE_TABLET | Freq: Every day | ORAL | Status: DC
  Administered 2019-09-23: 81 mg via ORAL
  Filled 2019-09-23: qty 1

## 2019-09-23 MED ORDER — LACTATED RINGERS IV SOLN: INTRAVENOUS | Status: DC

## 2019-09-23 NOTE — Evaluation (Addendum)
Speech Language Pathology Evaluation Patient Details Name: [name] MRN: [id number] DOB: 1928-01-27 Today's Date: 09/23/2019 Time: [id number] SLP Time Calculation (min) (ACUTE ONLY): 15 min  Problem List:  Patient Active Problem List   Diagnosis Date Noted  . COVID-19 virus detected 09/23/2019  . Stroke aborted by administration of thrombolytic agent ([place]) - R MCA s/p tPA and mechanical thrombectomy, unk embolic source [id number]  . (HFpEF) heart failure with preserved ejection fraction ([place]) 09/19/2019  . Morbid obesity ([place]) 09/19/2019  . CKD (chronic kidney disease), stage III 09/17/2019  . Spontaneous subarachnoid hemorrhage ([place]) 10/04/2018  . Unstable gait 01/19/2018  . Venous stasis dermatitis of both lower extremities 01/09/2018  . Diarrhea, chronic 01/09/2018  . Lower extremity ulceration ([place]) 10/22/2017  . Anxiety disorder 10/22/2017  . PONV (postoperative nausea and vomiting)   . Orthostatic hypotension   . Hypercholesterolemia   . Exogenous obesity   . Rectal bleeding 03/30/2017  . GI bleed 03/28/2017  . BRBPR (bright red blood per rectum) 03/27/2017  . Sigmoid diverticulitis 10/14/2016  . Diverticulitis 10/14/2016  . Diarrhea of presumed infectious origin 05/11/2016  . Varicose veins of bilateral lower extremities with other complications [id number]  . Venous insufficiency 08/25/2015  . Cellulitis 08/09/2015  . Pressure ulcer 08/09/2015  . CAD (coronary artery disease) 07/07/2015  . Open wound of right foot 07/07/2015  . Delusional disorder ([place]) 07/05/2015  . Bereavement reaction 07/05/2015  . Pacemaker at end of battery life 02/23/2015  . Acute GI bleeding 02/15/2015  . Acute kidney injury (nontraumatic) ([place]) 02/15/2015  . Hypothyroidism 02/15/2015  . Left upper quadrant pain   . Osteopenia 12/22/2013  . Postural dizziness 04/05/2013  . S/P placement of cardiac pacemaker 02/26/2013  . Bilateral breast cancer ([place]) 03/25/2011  . Invasive ductal  carcinoma of breast ([place]) 03/30/2007   Past Medical History:  Past Medical History:  Diagnosis Date  . Cataract   . Chronic congestive heart failure, unspecified heart failure type ([place]) 09/19/2019  . Complete heart block (HCC)    s/p PPM implant (MDT) by Dr Blanch Media.  Atrial lead could not be paced at time of the procedure.  She has chronic AV dysociation  . Delusional disorder ([place])   . Exogenous obesity   . Hypercholesterolemia   . Invasive ductal carcinoma of breast ([place]) 03/2007   BILATERAL BREASTS  . Orthostatic hypotension    treated with midodrine by Dr Rollene [name]  . PONV (postoperative nausea and vomiting)   . Thyroid disease   . Venous insufficiency    Past Surgical History:  Past Surgical History:  Procedure Laterality Date  . ABDOMINAL HYSTERECTOMY    . BREAST LUMPECTOMY Bilateral 2009  . CATARACT EXTRACTION     EYE SURGERY X 2  . CHOLECYSTECTOMY    . COLONOSCOPY N/A 02/16/2015   Procedure: COLONOSCOPY;  Surgeon: Wilford Corner, MD;  [facility]COPY;  Service: Endoscopy;  Laterality: N/A;  . EP IMPLANTABLE DEVICE N/A 02/23/2015   pacemaker generator change (MDT Sensia SR) by Dr Rayann Heman   . ESOPHAGOGASTRODUODENOSCOPY N/A 02/16/2015   Procedure: ESOPHAGOGASTRODUODENOSCOPY (EGD);  Surgeon: Wilford Corner, MD;  Location: [facility] ENDOSCOPY;  Service: Endoscopy;  Laterality: N/A;  . IR CT HEAD LTD  09/21/2019  . IR PERCUTANEOUS ART THROMBECTOMY/INFUSION INTRACRANIAL INC DIAG ANGIO  09/21/2019  . PACEMAKER INSERTION     MDT implanted by Dr Blanch Media.  Atrial lead placement was unsuccessful.  She has chronic AV dysociation  . RADIOLOGY WITH ANESTHESIA N/A 09/21/2019   Procedure: IR WITH ANESTHESIA  CODE STROKE;  Surgeon: Radiologist, Medication, MD;  Location: [facility];  Service: Radiology;  Laterality: N/A;  . remote right radical nephrectomy  2009   HPI:  [name] is a 86 y.o. female with history of thyroid disease, orthostatic hypotension, hypercholesterolemia,  delusional disorder, complete heart block status post pacer, chronic congestive heart failure. Per EMS patient was at [facility] picking up a prescription when she became confused with decreased movement of the left arm and leg.  Pt was lowered to the ground and noted to have right gaze preference.  Imaging showed M2 occlusion.  Pt was revascularized successfully in IR.   Assessment / Plan / Recommendation Clinical Impression  Pt was hyperverbal and tangential during assessment requiring redirection to topic frequently. Her speech is intelligible, language within normal limits. She was able to verbally problem solve basic situations. Pt reports she did not like her pill box and has a system that has been working for her. Therapist suspects verbal abilities are stronger than performance with more complex activities or those with higher cognitive demands.  She may benefit from home health ST to evaluate in home environment.     SLP Assessment  SLP Recommendation/Assessment: Patient does not need any further Speech [facility] SLP Visit Diagnosis: Cognitive communication deficit (R41.841)    Follow Up Recommendations  Home health SLP(possibly for eval )    Frequency and Duration           SLP Evaluation Cognition  Overall Cognitive Status: Within Functional Limits for tasks assessed Arousal/Alertness: Awake/alert Orientation Level: Oriented X4 Safety/Judgment: Appears intact       Comprehension  Auditory Comprehension Overall Auditory Comprehension: Appears within functional limits for tasks assessed Reading Comprehension Reading Status: Not tested    Expression Expression Primary Mode of Expression: Verbal Verbal Expression Overall Verbal Expression: Appears within functional limits for tasks assessed Written Expression Dominant Hand: Right Written Expression: Not tested   Oral / Motor  Oral Motor/Sensory Function Overall Oral Motor/Sensory Function: Within functional  limits Motor Speech Overall Motor Speech: Appears within functional limits for tasks assessed   GO                    Houston Siren 09/23/2019, 5:06 PM   Orbie Pyo Colvin Caroli.Ed Risk analyst [phone] Office [phone]

## 2019-09-23 NOTE — TOC Transition Note (Addendum)
Transition of Care [facility]) - CM/SW Discharge Note   Patient Details  Name: [name] MRN: [id number] Date of Birth: [birth year]  Transition of Care [facility]) CM/SW Contact:  Maryclare Labrador, RN Phone Number: 09/23/2019, 4:53 PM   Clinical Narrative:   Pt deemed stable for diischarge home today.  CM spoke with pt and POA [name].  PTA pt was independent from home alone - pt still drives.  Pt/POA In agreement for [facility] as ordered - confirmed pts home address.  CM provided medicare.gov HH list - no preference given - [facility] accepts referral.  CM verified address in Epic to be correct.  [name] is out of [place] for the evening and will pick up pt's discharge meds tomorrow am ( new meds are due in the am).  Pt will need transportation home - pt confirms she has a key to get into her home.  CM contacted non ambulance transport for COVID pt's however they are unable to schedule for today.  Pt in agreement to transport via [facility].  [name] to arrange for pts neighbor to check on her once she arrives home and provide intermittent supervision.  NO other CM needs determined - CM signing off    Final next level of care: [place]     Patient Goals and CMS Choice Patient states their goals for this hospitalization and ongoing recovery are:: Pt did not specifiy a specific goal at discharge CMS Medicare.gov Compare Post Acute Care list provided to:: Patient Choice offered to / list presented to : [facility] POA / Guardian, Patient  Discharge Placement                Patient to be transferred to facility by: [place] Name of family member notified: POA [name] Patient and family notified of of transfer: 09/23/19  Discharge Plan and Services                          HH Arranged: PT, OT [place] Agency: [place] Date [place]: 09/23/19 Time [place]: [place] Representative spoke with at [place]: [place] ([place]) Interventions      Readmission Risk Interventions No flowsheet data found.

## 2019-09-23 NOTE — Progress Notes (Signed)
CONSULT  PROGRESS NOTE                                                                                                                                                                                                             Patient Demographics:    [name], is a 86 y.o. female, DOB - 29-Aug-1927, XIP:[id number]  Outpatient Primary MD for the patient is [place], [name], MD    LOS - 2  Admit date - 09/21/2019    Chief Complaint  Patient presents with  . Code Stroke       Brief Narrative - this is a 86 year old female who was admitted by stroke team for acute stroke, had urgent thrombectomy with resolution of her stroke symptoms, incidental COVID-19 finding, she is s/p 1 short of COVID-19 vaccine few weeks ago, she has no pulmonary symptoms whatsoever.  We were consulted for managing COVID-19.   Subjective:    [name] today has, No headache, No chest pain, No abdominal pain - No Nausea, No new weakness tingling or numbness, no cough, no shortness of breath, no fatigue.   Assessment  & Plan :      1. Incidental COVID-19 infection in a patient who has received first shot of COVID-19 vaccine several weeks ago and is completely symptom-free on room air -  She incidental infection, no symptoms, chest x-ray negative, CRP 0.6, no Covid specific treatment needed follow with PCP in a week.  I have conveyed this to the primary team which is neurology.  She is good to be discharged from medical standpoint as desired by the neurology team.    Recent Labs  Lab 09/21/19 1027 09/23/19 0527  CRP  --  0.6  DDIMER  --  2.46*  SARSCOV2NAA POSITIVE*  --     Hepatic Function Latest Ref Rng & Units 09/23/2019 09/21/2019 04/02/2019  Total Protein 6.5 - 8.1 [name]/dL 6.2(L) 6.9 7.4  Albumin 3.5 - 5.0 [name]/dL 3.3(L) 3.6 3.8  AST 15 - 41 U/L 17 17 17   ALT 0 - 44 U/L 14 12 12   Alk Phosphatase 38 - 126 U/L 69 84 96  Total Bilirubin 0.3 -  1.2 mg/dL 0.7 0.8 0.7       ABG     Component Value Date/Time   HCO3 29.0 (H) 11/19/2006 1021   TCO2 26 09/21/2019 1005  2.  CVA.  Defer management to primary team which is neurology.  3.  AKI.  Most likely contrast nephropathy, LR to 250 cc bolus for 3 hours, repeat BMP by PCP in 1 week.  Avoid further nephrotoxin use for the next 7 to 10 days.    DVT Prophylaxis  : Per primary team  Lab Results  Component Value Date   PLT 233 09/21/2019    Diet :  Diet Order            Diet Heart Room service appropriate? Yes; Fluid consistency: Thin  Diet effective now               Inpatient Medications  Scheduled Meds: .  stroke: mapping our early stages of recovery book   Does not apply Once  . atorvastatin  10 mg Oral Daily  . Chlorhexidine Gluconate Cloth  6 each Topical Daily  . dexamethasone  6 mg Oral Daily  . furosemide  10 mg Oral Daily  . insulin aspart  1-3 Units Subcutaneous Q4H  . latanoprost  1 drop Both Eyes QHS  . levothyroxine  50 mcg Oral QAC breakfast  . midodrine  2.5 mg Oral QPC breakfast  . potassium chloride SA  20 mEq Oral Daily   Continuous Infusions: . remdesivir 100 mg in NS 100 mL 200 mL/hr at 09/22/19 1000   PRN Meds:.acetaminophen **OR** acetaminophen (TYLENOL) oral liquid 160 mg/5 mL **OR** acetaminophen, senna-docusate  Antibiotics  :    Anti-infectives (From admission, onward)   Start     Dose/Rate Route Frequency Ordered Stop   09/22/19 1000  remdesivir 100 mg in sodium chloride 0.9 % 100 mL IVPB     100 mg 200 mL/hr over 30 Minutes Intravenous Daily 09/21/19 1439 09/26/19 0959   09/21/19 1530  remdesivir 200 mg in sodium chloride 0.9% 250 mL IVPB     200 mg 580 mL/hr over 30 Minutes Intravenous Once 09/21/19 1439 09/21/19 2053       Time Spent in minutes  30   Lala Lund M.D on 09/23/2019 at 9:18 AM  To page go to [email or web address] - password [facility]  [facility] -  Office  [phone]   See all Orders from  today for further details    Objective:   Vitals:   09/22/19 1800 09/22/19 2318 09/23/19 0302 09/23/19 0719  BP: 118/74 118/70 114/73 111/70  Pulse: 61   64  Resp: 19   18  Temp:  98.3 F (36.8 C) 98.2 F (36.8 C) 97.6 F (36.4 C)  TempSrc:  Oral Oral   SpO2: 95%   97%  Weight:      Height:        Wt Readings from Last 3 Encounters:  09/21/19 96.6 kg  09/17/19 95.4 kg  05/18/19 99.6 kg     Intake/Output Summary (Last 24 hours) at 09/23/2019 0918 Last data filed at 09/23/2019 0241 Gross per 24 hour  Intake 314.48 ml  Output 500 ml  Net -185.52 ml     Physical Exam  Awake Alert, No new F.N deficits, minimal left-sided weakness strength is still 5/5.  Normal affect Oval.AT,PERRAL Supple Neck,No JVD, No cervical lymphadenopathy appriciated.  Symmetrical Chest wall movement, Good air movement bilaterally, CTAB RRR,No Gallops,Rubs or new Murmurs, No Parasternal Heave +ve B.Sounds, Abd Soft, No tenderness, No organomegaly appriciated, No rebound - guarding or rigidity. No Cyanosis, Clubbing or edema, No new Rash or bruise     Data Review:    CBC  Recent Labs  Lab 09/21/19 1000 09/21/19 1005  WBC 9.1  --   HGB 13.6 14.3  HCT 42.8 42.0  PLT 233  --   MCV 97.7  --   MCH 31.1  --   MCHC 31.8  --   RDW 13.8  --   LYMPHSABS 1.4  --   MONOABS 0.8  --   EOSABS 0.6*  --   BASOSABS 0.1  --     Chemistries  Recent Labs  Lab 09/17/19 1218 09/17/19 1218 09/21/19 1000 09/21/19 1005 09/22/19 0848 09/23/19 0527  NA  --  140 142 143  --  142  K  --  3.4* 3.6 3.5  --  4.4  CL  --  108 108 107  --  108  CO2  --  26 25  --   --  21*  GLUCOSE  --  90 92 89  --  101*  BUN  --  20 18 19   --  25*  CREATININE  --  1.06 1.23* 1.20*  --  1.35*  CALCIUM  --  9.0 9.0  --   --  8.3*  AST  --   --  17  --   --  17  ALT  --   --  12  --   --  14  ALKPHOS  --   --  84  --   --  69  BILITOT  --   --  0.8  --   --  0.7  INR  --   --  1.0  --   --   --   TSH 2.72  --   --    --   --   --   HGBA1C  --   --   --   --  6.1*  --      ------------------------------------------------------------------------------------------------------------------ Recent Labs    09/22/19 0848  CHOL 156  HDL 58  LDLCALC 89  TRIG 45  CHOLHDL 2.7    Lab Results  Component Value Date   HGBA1C 6.1 (H) 09/22/2019   ------------------------------------------------------------------------------------------------------------------ No results for input(s): TSH, T4TOTAL, T3FREE, THYROIDAB in the last 72 hours.  Invalid input(s): FREET3  Cardiac Enzymes No results for input(s): CKMB, TROPONINI, MYOGLOBIN in the last 168 hours.  Invalid input(s): CK ------------------------------------------------------------------------------------------------------------------    Component Value Date/Time   BNP 313.7 (H) 01/15/2018 1219    Micro Results Recent Results (from the past 240 hour(s))  SARS Coronavirus 2 by RT PCR (hospital order, performed in [facility] hospital lab) Nasopharyngeal Nasopharyngeal Swab     Status: Abnormal   Collection Time: 09/21/19 10:27 AM   Specimen: Nasopharyngeal Swab  Result Value Ref Range Status   SARS Coronavirus 2 POSITIVE (A) NEGATIVE Final    Comment: RESULT CALLED TO, READ BACK BY AND VERIFIED WITH: B. Mullis RT 11:45 09/21/19 ([email or web address]) (NOTE) SARS-CoV-2 target nucleic acids are DETECTED SARS-CoV-2 RNA is generally detectable in upper respiratory specimens  during the acute phase of infection.  Positive results are indicative  of the presence of the identified virus, but do not rule out bacterial infection or co-infection with other pathogens not detected by the test.  Clinical correlation with patient history and  other diagnostic information is necessary to determine patient infection status.  The expected result is negative. Fact Sheet for Patients:   StrictlyIdeas.no  Fact Sheet for Healthcare Providers:     BankingDealers.co.za   This test is not yet approved or cleared by the  [place] Architectural technologist and  has been authorized for detection and/or diagnosis of SARS-CoV-2 by FDA under an Print production planner (EUA).  This EUA will remain in effect (meaning this test can  be used) for the duration of  the COVID-19 declaration under Section 564(b)(1) of the Act, 21 U.S.C. section 360-bbb-3(b)(1), unless the authorization is terminated or revoked sooner. Performed at [facility], [place] [address] [place], [place] [address]   MRSA PCR Screening     Status: None   Collection Time: 09/21/19  1:02 PM   Specimen: Nasal Mucosa; Nasopharyngeal  Result Value Ref Range Status   MRSA by PCR NEGATIVE NEGATIVE Final    Comment:        The GeneXpert MRSA Assay (FDA approved for NASAL specimens only), is one component of a comprehensive MRSA colonization surveillance program. It is not intended to diagnose MRSA infection nor to guide or monitor treatment for MRSA infections. Performed at [facility], [place] [address] [place],  [address]     Radiology Reports CT HEAD WO CONTRAST  Result Date: 09/22/2019 CLINICAL DATA:  Stroke post thrombectomy, follow-up EXAM: CT HEAD WITHOUT CONTRAST TECHNIQUE: Contiguous axial images were obtained from the base of the skull through the vertex without intravenous contrast. COMPARISON:  09/21/2019 FINDINGS: Brain: There is no acute intracranial hemorrhage. No mass effect or edema. No new loss of gray-white differentiation. Ventricles are stable in size. Patchy hypoattenuation in the supratentorial white matter is nonspecific probably reflects chronic microvascular ischemic changes. Vascular: Intracranial atherosclerotic calcification at the skull base. Skull: Unremarkable. Sinuses/Orbits: Sinonasal surgery. Mild mucosal thickening. No significant orbital finding. Other: Mastoid air cells are clear. IMPRESSION: No acute intracranial  hemorrhage. No new loss of gray-white differentiation. Electronically Signed   By: Macy Mis M.D.   On: 09/22/2019 15:20   IR CT Head Ltd  Result Date: 09/21/2019 INDICATION: 86 year old female with past medical history significant for thyroid disease, orthostatic hypotension, hypercholesterolemia, delusional disorder, complete heart block status post pacer, chronic congestive heart failure with a baseline modified Rankin scale 0. She presented to emergency with right gaze preference and left-sided weakness, NIHSS 10. Her last normal 9 a.m. on 09/21/2019. Head CT showed no evidence of a large territorial infarct. CT angiogram showed a proximal right M2/MCA. She was taken to our service for diagnostic cerebral angiogram and mechanical thrombectomy. EXAM: Diagnostic cerebral angiogram Mechanical thrombectomy, right MCA/M2 occlusion Flat panel head CT COMPARISON:  CT/CT angiogram of the head and neck Sep 21, 2019 MEDICATIONS: 5 mg of verapamil given intra-arterially to the right ICA. ANESTHESIA/SEDATION: The procedure was performed under general anesthesia. FLUOROSCOPY TIME:  Fluoroscopy Time: 7 minutes 48 seconds (414 mGy). COMPLICATIONS: None immediate. TECHNIQUE: Informed written consent was waved as patient was obtained unable to consent for herself and no healthcare proxy or next of kin was identified in time. Discussion with neuro hospitalist held. We agree that going forward with intervention was in the best interest of the patient. Maximal Sterile Barrier Technique was utilized including caps, mask, sterile gowns, sterile gloves, sterile drape, hand hygiene and skin antiseptic. A timeout was performed prior to the initiation of the procedure. The right groin was prepped and draped in the usual sterile fashion. Using a micropuncture kit and the modified Seldinger technique, access was gained to the right common femoral artery and an 8 French sheath was placed. Then, an 8 [place] Walrus balloon guide  catheter was navigated over a 6 [place] Berenstein 2 and a 0.035 inch  Terumo Glidewire into the aortic arch. Under fluoroscopy, the catheter was placed into the right common carotid artery and then advanced to the cervical right internal carotid artery. The Berenstein catheter and the Glidewire were removed. Frontal and lateral angiograms of the upper neck and head were obtained. FINDINGS: 1. Occlusion of the proximal right M2/MCA inferior division branch. 2. Increased tortuosity of the cervical right ICA. 3. Mild atherosclerotic changes in the right carotid bulb without hemodynamically significant stenosis. 4. Ectatic cavernous segment of the right ICA. 5. Severely hypoplastic A1/ACA segment with no significant opacification of the A2 segment. PROCEDURE: Using biplane roadmap, a catalyst 6 aspiration catheter was navigated over a phenom 21 microcatheter and a synchro support flex microguidewire into the cavernous segment of the right ICA. This phenom microcatheter was then advanced into the proximal M3/MCA inferior division branch. A 5 x 37 mm Embotrap III thrombectomy device was deployed spanning the distal M1 and the entire length of the M2 segment. The device was allowed to intercalated with the clot for 4 minutes. The microcatheter was removed under fluoroscopy. The aspiration catheter was advanced to the level of occlusion and connected to an aspiration pump. The guiding catheter balloon was inflated and the thrombectomy device plus aspiration catheter were removed en bloc under constant aspiration. The guiding catheter was aspirated. Frontal and lateral angiograms of the head were obtained via guide catheter contrast injection. Complete recanalization of the right MCA was seen (TICI 3). Mild device induced vasospasm was noted. Intra arterial infusion of 5 mg of verapamil was performed over 10 minutes. Follow-up angiograms showed vasospasm resolution. No embolus to new territory seen. Flat panel CT of the head  was obtained and post processed in a separate workstation with concurrent attending physician supervision. Selected images were sent to PACS. No evidence of hemorrhagic complication seen. A right common femoral artery angiogram was obtained with right anterior oblique view. The puncture is at the level of the common femoral artery. No significant atherosclerotic disease, plaques are stenosis seen. The femoral sheath was exchanged over the wire for a 6 French Perclose ProGlide which was utilized for access closure. Immediate hemostasis was achieved. IMPRESSION: 1. Successful mechanical thrombectomy for treatment of a proximal right M2/MCA inferior division branch occlusion with complete recanalization (TICI3) after 1 pass with combined aspiration and stent retriever (ARTS technique). 2. No thromboembolic or hemorrhagic complication. PLAN: 1. Patient remained intubated given a positive COVID test. 2. Bed rest with no hip flexion for 6 hours. 3. Further management per Neurology and CCM. Electronically Signed   By: Pedro Earls M.D.   On: 09/21/2019 14:38   DG CHEST PORT 1 VIEW  Result Date: 09/21/2019 CLINICAL DATA:  Acute hypoxemic respiratory failure.  Extubation EXAM: PORTABLE CHEST 1 VIEW COMPARISON:  04/16/2018 FINDINGS: Cardiac enlargement without heart failure. Mild scarring in the lung bases unchanged. No acute infiltrate or effusion. Single lead pacemaker lead in the right ventricle unchanged from the prior study. Thoracic dextro scoliosis. IMPRESSION: Chronic lung disease with mild scarring in the bases. No acute abnormality. Electronically Signed   By: Franchot Gallo M.D.   On: 09/21/2019 15:02   ECHOCARDIOGRAM COMPLETE  Result Date: 09/22/2019    ECHOCARDIOGRAM REPORT   Patient Name:   PRESLEA RHODUS Date of Exam: 09/22/2019 Medical Rec #:  [id number]        Height:       67.0 in Accession #:    [id number]       Weight:  213.0 lb Date of Birth:  06-01-1927       BSA:           2.077 m Patient Age:    59 years         BP:           116/61 mmHg Patient Gender: F                HR:           63 bpm. Exam Location:  Inpatient Procedure: 2D Echo, Cardiac Doppler and Color Doppler Indications:    Stroke 434.91 / I163.9  History:        Patient has prior history of Echocardiogram examinations, most                 recent 12/30/2012. CHF, CAD, Pacemaker, Stroke; Risk                 Factors:Dyslipidemia and Non-Smoker. Breast cancer.  Sonographer:    Vickie Epley RDCS Referring Phys: [place] Comments: Covid positive. IMPRESSIONS  1. Left ventricular ejection fraction, by estimation, is 55 to 60%. The left ventricle has normal function. The left ventricle has no regional wall motion abnormalities. Left ventricular diastolic function could not be evaluated.  2. Right ventricular systolic function is normal. The right ventricular size is normal. There is mildly elevated pulmonary artery systolic pressure. The estimated right ventricular systolic pressure is 60.6 mmHg.  3. The mitral valve is grossly normal. No evidence of mitral valve regurgitation. No evidence of mitral stenosis.  4. The aortic valve is tricuspid. Aortic valve regurgitation is not visualized. Mild aortic valve sclerosis is present, with no evidence of aortic valve stenosis.  5. The inferior vena cava is dilated in size with <50% respiratory variability, suggesting right atrial pressure of 15 mmHg. FINDINGS  Left Ventricle: Left ventricular ejection fraction, by estimation, is 55 to 60%. The left ventricle has normal function. The left ventricle has no regional wall motion abnormalities. The left ventricular internal cavity size was normal in size. There is  no left ventricular hypertrophy. Abnormal (paradoxical) septal motion, consistent with RV pacemaker. Left ventricular diastolic function could not be evaluated due to paced rhythm. Left ventricular diastolic function could not be evaluated. Right Ventricle:  The right ventricular size is normal. No increase in right ventricular wall thickness. Right ventricular systolic function is normal. There is mildly elevated pulmonary artery systolic pressure. The tricuspid regurgitant velocity is 2.65  m/s, and with an assumed right atrial pressure of 15 mmHg, the estimated right ventricular systolic pressure is 30.1 mmHg. Left Atrium: Left atrial size was normal in size. Right Atrium: Right atrial size was normal in size. Pericardium: Trivial pericardial effusion is present. Presence of pericardial fat pad. Mitral Valve: The mitral valve is grossly normal. No evidence of mitral valve regurgitation. No evidence of mitral valve stenosis. Tricuspid Valve: The tricuspid valve is grossly normal. Tricuspid valve regurgitation is trivial. No evidence of tricuspid stenosis. Aortic Valve: The aortic valve is tricuspid. Aortic valve regurgitation is not visualized. Mild aortic valve sclerosis is present, with no evidence of aortic valve stenosis. Pulmonic Valve: The pulmonic valve was grossly normal. Pulmonic valve regurgitation is trivial. No evidence of pulmonic stenosis. Aorta: The aortic root is normal in size and structure. Venous: The inferior vena cava is dilated in size with less than 50% respiratory variability, suggesting right atrial pressure of 15 mmHg. IAS/Shunts: The atrial septum is grossly normal. Additional  Comments: A pacer wire is visualized in the right atrium and right ventricle.  LEFT VENTRICLE PLAX 2D LVIDd:         5.10 cm LV PW:         1.05 cm LV IVS:        0.82 cm LVOT diam:     2.40 cm LV SV:         89 LV SV Index:   43 LVOT Area:     4.52 cm  LV Volumes (MOD) LV vol d, MOD A2C: 66.5 ml LV vol d, MOD A4C: 70.6 ml LV vol s, MOD A2C: 46.7 ml LV vol s, MOD A4C: 39.5 ml LV SV MOD A2C:     19.8 ml LV SV MOD A4C:     70.6 ml LV SV MOD BP:      27.0 ml RIGHT VENTRICLE TAPSE (M-mode): 1.2 cm LEFT ATRIUM             Index       RIGHT ATRIUM           Index LA Vol  (A2C):   38.3 ml 18.44 ml/m RA Area:     15.30 cm LA Vol (A4C):   44.8 ml 21.57 ml/m RA Volume:   36.10 ml  17.38 ml/m LA Biplane Vol: 41.8 ml 20.13 ml/m  AORTIC VALVE LVOT Vmax:   97.30 cm/s LVOT Vmean:  64.700 cm/s LVOT VTI:    0.197 m  AORTA Ao Root diam: 3.10 cm TRICUSPID VALVE TR Peak grad:   28.1 mmHg TR Vmax:        265.00 cm/s  SHUNTS Systemic VTI:  0.20 m Systemic Diam: 2.40 cm Eleonore Chiquito MD Electronically signed by Eleonore Chiquito MD Signature Date/Time: 09/22/2019/2:47:33 PM    Final    IR PERCUTANEOUS ART THROMBECTOMY/INFUSION INTRACRANIAL INC DIAG ANGIO  Result Date: 09/21/2019 INDICATION: 86 year old female with past medical history significant for thyroid disease, orthostatic hypotension, hypercholesterolemia, delusional disorder, complete heart block status post pacer, chronic congestive heart failure with a baseline modified Rankin scale 0. She presented to emergency with right gaze preference and left-sided weakness, NIHSS 10. Her last normal 9 a.m. on 09/21/2019. Head CT showed no evidence of a large territorial infarct. CT angiogram showed a proximal right M2/MCA. She was taken to our service for diagnostic cerebral angiogram and mechanical thrombectomy. EXAM: Diagnostic cerebral angiogram Mechanical thrombectomy, right MCA/M2 occlusion Flat panel head CT COMPARISON:  CT/CT angiogram of the head and neck Sep 21, 2019 MEDICATIONS: 5 mg of verapamil given intra-arterially to the right ICA. ANESTHESIA/SEDATION: The procedure was performed under general anesthesia. FLUOROSCOPY TIME:  Fluoroscopy Time: 7 minutes 48 seconds (414 mGy). COMPLICATIONS: None immediate. TECHNIQUE: Informed written consent was waved as patient was obtained unable to consent for herself and no healthcare proxy or next of kin was identified in time. Discussion with neuro hospitalist held. We agree that going forward with intervention was in the best interest of the patient. Maximal Sterile Barrier Technique was  utilized including caps, mask, sterile gowns, sterile gloves, sterile drape, hand hygiene and skin antiseptic. A timeout was performed prior to the initiation of the procedure. The right groin was prepped and draped in the usual sterile fashion. Using a micropuncture kit and the modified Seldinger technique, access was gained to the right common femoral artery and an 8 French sheath was placed. Then, an 8 [place] Walrus balloon guide catheter was navigated over a 6 [place] Berenstein 2 and a 0.035 inch Terumo  Glidewire into the aortic arch. Under fluoroscopy, the catheter was placed into the right common carotid artery and then advanced to the cervical right internal carotid artery. The Berenstein catheter and the Glidewire were removed. Frontal and lateral angiograms of the upper neck and head were obtained. FINDINGS: 1. Occlusion of the proximal right M2/MCA inferior division branch. 2. Increased tortuosity of the cervical right ICA. 3. Mild atherosclerotic changes in the right carotid bulb without hemodynamically significant stenosis. 4. Ectatic cavernous segment of the right ICA. 5. Severely hypoplastic A1/ACA segment with no significant opacification of the A2 segment. PROCEDURE: Using biplane roadmap, a catalyst 6 aspiration catheter was navigated over a phenom 21 microcatheter and a synchro support flex microguidewire into the cavernous segment of the right ICA. This phenom microcatheter was then advanced into the proximal M3/MCA inferior division branch. A 5 x 37 mm Embotrap III thrombectomy device was deployed spanning the distal M1 and the entire length of the M2 segment. The device was allowed to intercalated with the clot for 4 minutes. The microcatheter was removed under fluoroscopy. The aspiration catheter was advanced to the level of occlusion and connected to an aspiration pump. The guiding catheter balloon was inflated and the thrombectomy device plus aspiration catheter were removed en bloc under  constant aspiration. The guiding catheter was aspirated. Frontal and lateral angiograms of the head were obtained via guide catheter contrast injection. Complete recanalization of the right MCA was seen (TICI 3). Mild device induced vasospasm was noted. Intra arterial infusion of 5 mg of verapamil was performed over 10 minutes. Follow-up angiograms showed vasospasm resolution. No embolus to new territory seen. Flat panel CT of the head was obtained and post processed in a separate workstation with concurrent attending physician supervision. Selected images were sent to PACS. No evidence of hemorrhagic complication seen. A right common femoral artery angiogram was obtained with right anterior oblique view. The puncture is at the level of the common femoral artery. No significant atherosclerotic disease, plaques are stenosis seen. The femoral sheath was exchanged over the wire for a 6 French Perclose ProGlide which was utilized for access closure. Immediate hemostasis was achieved. IMPRESSION: 1. Successful mechanical thrombectomy for treatment of a proximal right M2/MCA inferior division branch occlusion with complete recanalization (TICI3) after 1 pass with combined aspiration and stent retriever (ARTS technique). 2. No thromboembolic or hemorrhagic complication. PLAN: 1. Patient remained intubated given a positive COVID test. 2. Bed rest with no hip flexion for 6 hours. 3. Further management per Neurology and CCM. Electronically Signed   By: Pedro Earls M.D.   On: 09/21/2019 14:38   CT HEAD CODE STROKE WO CONTRAST  Result Date: 09/21/2019 CLINICAL DATA:  Code stroke.  Confusion, left-sided weakness EXAM: CT HEAD WITHOUT CONTRAST TECHNIQUE: Contiguous axial images were obtained from the base of the skull through the vertex without intravenous contrast. COMPARISON:  CT head 04/02/2019 FINDINGS: Brain: Moderate atrophy. Patchy white matter hypodensity bilaterally consistent with chronic  microvascular ischemia. Negative for acute infarct, hemorrhage, mass Vascular: Hyperdense right MCA in the sylvian fissure involving M2 branches. This is consistent with acute thrombus and was not present previously. No other acute vascular abnormality. Skull: Negative Sinuses/Orbits: Prior sinus surgery. Mucosal edema in the paranasal sinuses. No orbital lesion.  Bilateral cataract extraction. Other: None ASPECTS ([place] Stroke Program Early CT Score) - Ganglionic level infarction (caudate, lentiform nuclei, internal capsule, insula, M1-M3 cortex): 7 - Supraganglionic infarction (M4-M6 cortex): 3 Total score (0-10 with 10 being normal): 10  IMPRESSION: 1. Hyperdense right M2 branches compatible with acute thrombus. Negative for acute infarct or hemorrhage 2. ASPECTS is 10 3. Preliminary results texted to Dr. Lucretia Kern Electronically Signed   By: Franchot Gallo M.D.   On: 09/21/2019 10:17   CT ANGIO HEAD CODE STROKE  Result Date: 09/21/2019 CLINICAL DATA:  86 year old female code stroke presentation with left side weakness. Hyperdense right MCA M2 branches on plain head CT 1008 hours today. EXAM: CT ANGIOGRAPHY HEAD AND NECK TECHNIQUE: Multidetector CT imaging of the head and neck was performed using the standard protocol during bolus administration of intravenous contrast. Multiplanar CT image reconstructions and MIPs were obtained to evaluate the vascular anatomy. Carotid stenosis measurements (when applicable) are obtained utilizing NASCET criteria, using the distal internal carotid diameter as the denominator. CONTRAST:  21m OMNIPAQUE IOHEXOL 350 MG/ML SOLN COMPARISON:  plain head CT 1008 hours today. FINDINGS: CTA NECK Skeleton: Widespread cervical spine degeneration. 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 Left TMJ degeneration. No acute osseous abnormality identified. Upper chest: Left-side cardiac pacemaker type device. Otherwise negative. Other neck: Small bilateral mixed  density thyroid nodules do not meet size criteria for follow-up (ref: J Am Coll Radiol. 2015 Feb;12(2): 143-50).No acute findings. Aortic arch: 3 vessel arch configuration. Mild for age arch atherosclerosis. Right carotid system: Tortuous brachiocephalic artery and proximal right CCA with mild plaque but no stenosis. Mild calcified plaque at the right ICA origin, soft and calcified plaque at the bulb. But no associated stenosis. Mildly tortuous right ICA. Left carotid system: Tortuous proximal left CCA without stenosis. Mild calcified plaque at the left ICA origin and bulb without stenosis. Ectatic left ICA just below the skull base. Vertebral arteries: Mildly tortuous proximal right subclavian artery with no plaque or stenosis. Normal right vertebral artery origin. Tortuous right V1 segment. The right vertebral is tortuous throughout the neck but patent to the skull base without stenosis. Normal proximal left subclavian artery and left vertebral artery origin. Tortuous left vertebral artery in the neck with no significant plaque or stenosis. CTA HEAD Posterior circulation: Mildly dominant right V4 segment. No distal vertebral plaque or stenosis. Patent PICA origins. Tortuous vertebrobasilar junction is patent without stenosis. The basilar artery is patent but diminutive, functionally terminating in the SCA is. Fetal type bilateral PCA origins. Tortuous posterior communicating arteries. But bilateral PCA branches are within normal limits. Anterior circulation: Both ICA siphons are dolichoectatic, up to 7 mm diameter on the right and nearly 9 mm diameter on the left (series 9, image 110). Both siphons are patent with mild to moderate calcified plaque but no siphon stenosis. Normal ophthalmic and posterior communicating artery origins. Patent mildly ectatic carotid termini. Dominant left and diminutive or absent right ACA A1 segments. Anterior communicating artery and bilateral ACA branches are within normal limits.  Left MCA M1 segment and bifurcation are patent without stenosis. Left MCA branches are within normal limits. Right MCA M1 segment bifurcates slightly early without stenosis (series 11, image 17 and series 10, image 18). However, there is occlusion of the dominant right M2 branch which initially courses anteriorly. See series 10, image 18. And this occlusion appears to occur just proximal to the bifurcation to M3 branches (series 9, image 61). Intermediate appearing right MCA branch reconstitution on these early phase images. Venous sinuses: Pain. Anatomic variants: Dominant left and diminutive or absent right ACA A1 segments. Dominant right vertebral artery and diminutive basilar on the basis of fetal PCA origins. Review of the MIP images confirms the above  findings IMPRESSION: 1. 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 2. No other arterial stenosis in the head or neck. Dolichoectatic bilateral ICA siphons measuring 7-9 mm diameter. Bilateral fetal PCA origins. Mild for age proximal ICA atherosclerosis. Preliminary results of #1 were communicated to Dr. Lorraine Lax at 10:32 am on 09/21/2019 by text page via the [facility] messaging system. Electronically Signed   By: Genevie Ann M.D.   On: 09/21/2019 10:41   CT ANGIO NECK CODE STROKE  Result Date: 09/21/2019 CLINICAL DATA:  86 year old female code stroke presentation with left side weakness. Hyperdense right MCA M2 branches on plain head CT 1008 hours today. EXAM: CT ANGIOGRAPHY HEAD AND NECK TECHNIQUE: Multidetector CT imaging of the head and neck was performed using the standard protocol during bolus administration of intravenous contrast. Multiplanar CT image reconstructions and MIPs were obtained to evaluate the vascular anatomy. Carotid stenosis measurements (when applicable) are obtained utilizing NASCET criteria, using the distal internal carotid diameter as the denominator. CONTRAST:  12m OMNIPAQUE IOHEXOL 350  MG/ML SOLN COMPARISON:  plain head CT 1008 hours today. FINDINGS: CTA NECK Skeleton: Widespread cervical spine degeneration. 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 Left TMJ degeneration. No acute osseous abnormality identified. Upper chest: Left-side cardiac pacemaker type device. Otherwise negative. Other neck: Small bilateral mixed density thyroid nodules do not meet size criteria for follow-up (ref: J Am Coll Radiol. 2015 Feb;12(2): 143-50).No acute findings. Aortic arch: 3 vessel arch configuration. Mild for age arch atherosclerosis. Right carotid system: Tortuous brachiocephalic artery and proximal right CCA with mild plaque but no stenosis. Mild calcified plaque at the right ICA origin, soft and calcified plaque at the bulb. But no associated stenosis. Mildly tortuous right ICA. Left carotid system: Tortuous proximal left CCA without stenosis. Mild calcified plaque at the left ICA origin and bulb without stenosis. Ectatic left ICA just below the skull base. Vertebral arteries: Mildly tortuous proximal right subclavian artery with no plaque or stenosis. Normal right vertebral artery origin. Tortuous right V1 segment. The right vertebral is tortuous throughout the neck but patent to the skull base without stenosis. Normal proximal left subclavian artery and left vertebral artery origin. Tortuous left vertebral artery in the neck with no significant plaque or stenosis. CTA HEAD Posterior circulation: Mildly dominant right V4 segment. No distal vertebral plaque or stenosis. Patent PICA origins. Tortuous vertebrobasilar junction is patent without stenosis. The basilar artery is patent but diminutive, functionally terminating in the SCA is. Fetal type bilateral PCA origins. Tortuous posterior communicating arteries. But bilateral PCA branches are within normal limits. Anterior circulation: Both ICA siphons are dolichoectatic, up to 7 mm diameter on the right and nearly 9 mm diameter on the left  (series 9, image 110). Both siphons are patent with mild to moderate calcified plaque but no siphon stenosis. Normal ophthalmic and posterior communicating artery origins. Patent mildly ectatic carotid termini. Dominant left and diminutive or absent right ACA A1 segments. Anterior communicating artery and bilateral ACA branches are within normal limits. Left MCA M1 segment and bifurcation are patent without stenosis. Left MCA branches are within normal limits. Right MCA M1 segment bifurcates slightly early without stenosis (series 11, image 17 and series 10, image 18). However, there is occlusion of the dominant right M2 branch which initially courses anteriorly. See series 10, image 18. And this occlusion appears to occur just proximal to the bifurcation to M3 branches (series 9, image 61). Intermediate appearing right MCA branch reconstitution on these  early phase images. Venous sinuses: Pain. Anatomic variants: Dominant left and diminutive or absent right ACA A1 segments. Dominant right vertebral artery and diminutive basilar on the basis of fetal PCA origins. Review of the MIP images confirms the above findings IMPRESSION: 1. 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 2. No other arterial stenosis in the head or neck. Dolichoectatic bilateral ICA siphons measuring 7-9 mm diameter. Bilateral fetal PCA origins. Mild for age proximal ICA atherosclerosis. Preliminary results of #1 were communicated to Dr. Lorraine Lax at 10:32 am on 09/21/2019 by text page via the [facility] messaging system. Electronically Signed   By: Genevie Ann M.D.   On: 09/21/2019 10:41

## 2019-09-23 NOTE — Plan of Care (Signed)
  Problem: Clinical Measurements: Goal: Respiratory complications will improve Outcome: Progressing   

## 2019-09-23 NOTE — Discharge Summary (Addendum)
Stroke Discharge Summary  Patient ID: [name]   MRN: [id number]      DOB: 1928/01/08  Date of Admission: 09/21/2019 Date of Discharge: 09/23/2019  Attending Physician:  Garvin Fila, MD, Stroke MD Consultant(s):    [facility], Pulmonary Critical Care  Patient's PCP:  [place], [name], MD  DISCHARGE DIAGNOSIS:  Principal Problem:   Stroke aborted by administration of thrombolytic agent ([place]) - R MCA s/p tPA and mechanical thrombectomy, unk embolic source Active Problems:   Bilateral breast cancer ([place])   Hypothyroidism   Orthostatic hypotension   Hypercholesterolemia   Exogenous obesity   Anxiety disorder   CAD (coronary artery disease)   (HFpEF) heart failure with preserved ejection fraction ([place])   CKD (chronic kidney disease), stage III   COVID-19 virus detected   Allergies as of 09/23/2019      Reactions   Amoxicillin-pot Clavulanate Diarrhea, Nausea And Vomiting   Has patient had a PCN reaction causing immediate rash, facial/tongue/throat swelling, SOB or lightheadedness with hypotension: Yes Has patient had a PCN reaction causing severe rash involving mucus membranes or skin necrosis: No Has patient had a PCN reaction that required hospitalization: No Has patient had a PCN reaction occurring within the last 10 years: Yes If all of the above answers are "NO", then may proceed with Cephalosporin use.   Erythromycin Hives   Tape Other (See Comments)   BAND AIDS-SKIN IRRITATION   Codeine Nausea And Vomiting   Doxycycline Diarrhea   Flagyl [metronidazole] Hives   Macrobid [nitrofurantoin Macrocrystal] Nausea And Vomiting   Tolterodine Nausea And Vomiting   Ciprofloxacin Hives, Rash      Medication List    TAKE these medications   aspirin 81 MG EC tablet Take 1 tablet (81 mg total) by mouth daily.   atorvastatin 10 MG tablet Commonly known as: LIPITOR Take 1 tablet (10 mg total) by mouth daily. Start taking on: Sep 24, 2019    CALTRATE 600+D PO Take 1 tablet by mouth daily after breakfast.   clopidogrel 75 MG tablet Commonly known as: PLAVIX Take 1 tablet (75 mg total) by mouth daily. Stop taking after 21 days and continue aspirin   CULTURELLE PO Take 1 capsule by mouth every morning.   fesoterodine 4 MG Tb24 tablet Commonly known as: Toviaz Take 1 tablet (4 mg total) by mouth daily.   furosemide 20 MG tablet Commonly known as: LASIX Take 0.5 tablets (10 mg total) by mouth daily.   latanoprost 0.005 % ophthalmic solution Commonly known as: XALATAN Place 1 drop into both eyes at bedtime.   levothyroxine 50 MCG tablet Commonly known as: Synthroid Take 1 tablet (50 mcg total) by mouth daily before breakfast.   midodrine 2.5 MG tablet Commonly known as: PROAMATINE Take 1 tablet (2.5 mg total) by mouth daily after breakfast.   Potassium Chloride ER 20 MEQ Tbcr Take 20 mEq by mouth daily.       LABORATORY STUDIES CBC    Component Value Date/Time   WBC 9.1 09/21/2019 1000   RBC 4.38 09/21/2019 1000   HGB 14.3 09/21/2019 1005   HGB 15.3 09/19/2016 1002   HCT 42.0 09/21/2019 1005   HCT 45.5 09/19/2016 1002   PLT 233 09/21/2019 1000   PLT 226 09/19/2016 1002   MCV 97.7 09/21/2019 1000   MCV 93.6 09/19/2016 1002   MCH 31.1 09/21/2019 1000   MCHC 31.8 09/21/2019 1000   RDW 13.8 09/21/2019 1000   RDW  12.7 09/19/2016 1002   LYMPHSABS 1.4 09/21/2019 1000   LYMPHSABS 1.1 09/19/2016 1002   MONOABS 0.8 09/21/2019 1000   MONOABS 0.6 09/19/2016 1002   EOSABS 0.6 (H) 09/21/2019 1000   EOSABS 0.5 09/19/2016 1002   BASOSABS 0.1 09/21/2019 1000   BASOSABS 0.1 09/19/2016 1002   CMP    Component Value Date/Time   NA 142 09/23/2019 0527   NA 143 09/19/2016 1002   K 4.4 09/23/2019 0527   K 3.5 09/19/2016 1002   CL 108 09/23/2019 0527   CL 106 03/03/2012 0831   CO2 21 (L) 09/23/2019 0527   CO2 27 09/19/2016 1002   GLUCOSE 101 (H) 09/23/2019 0527   GLUCOSE 85 09/19/2016 1002   GLUCOSE 124  (H) 03/03/2012 0831   BUN 25 (H) 09/23/2019 0527   BUN 18.2 09/19/2016 1002   CREATININE 1.35 (H) 09/23/2019 0527   CREATININE 1.13 (H) 01/09/2018 1550   CREATININE 1.2 (H) 09/19/2016 1002   CALCIUM 8.3 (L) 09/23/2019 0527   CALCIUM 10.0 09/19/2016 1002   PROT 6.2 (L) 09/23/2019 0527   PROT 7.2 09/19/2016 1002   ALBUMIN 3.3 (L) 09/23/2019 0527   ALBUMIN 3.6 09/19/2016 1002   AST 17 09/23/2019 0527   AST 13 09/19/2016 1002   ALT 14 09/23/2019 0527   ALT 12 09/19/2016 1002   ALKPHOS 69 09/23/2019 0527   ALKPHOS 110 09/19/2016 1002   BILITOT 0.7 09/23/2019 0527   BILITOT 0.41 09/19/2016 1002   GFRNONAA 35 (L) 09/23/2019 0527   GFRAA 40 (L) 09/23/2019 0527   COAGS Lab Results  Component Value Date   INR 1.0 09/21/2019   INR 0.96 03/30/2017   INR 0.98 03/27/2017   Lipid Panel    Component Value Date/Time   CHOL 156 09/22/2019 0848   TRIG 45 09/22/2019 0848   HDL 58 09/22/2019 0848   CHOLHDL 2.7 09/22/2019 0848   VLDL 9 09/22/2019 0848   LDLCALC 89 09/22/2019 0848   HgbA1C  Lab Results  Component Value Date   HGBA1C 6.1 (H) 09/22/2019    SIGNIFICANT DIAGNOSTIC STUDIES CT HEAD WO CONTRAST  Result Date: 09/22/2019 CLINICAL DATA:  Stroke post thrombectomy, follow-up EXAM: CT HEAD WITHOUT CONTRAST TECHNIQUE: Contiguous axial images were obtained from the base of the skull through the vertex without intravenous contrast. COMPARISON:  09/21/2019 FINDINGS: Brain: There is no acute intracranial hemorrhage. No mass effect or edema. No new loss of gray-white differentiation. Ventricles are stable in size. Patchy hypoattenuation in the supratentorial white matter is nonspecific probably reflects chronic microvascular ischemic changes. Vascular: Intracranial atherosclerotic calcification at the skull base. Skull: Unremarkable. Sinuses/Orbits: Sinonasal surgery. Mild mucosal thickening. No significant orbital finding. Other: Mastoid air cells are clear. IMPRESSION: No acute intracranial  hemorrhage. No new loss of gray-white differentiation. Electronically Signed   By: Macy Mis M.D.   On: 09/22/2019 15:20   IR CT Head Ltd  Result Date: 09/21/2019 INDICATION: 86 year old female with past medical history significant for thyroid disease, orthostatic hypotension, hypercholesterolemia, delusional disorder, complete heart block status post pacer, chronic congestive heart failure with a baseline modified Rankin scale 0. She presented to emergency with right gaze preference and left-sided weakness, NIHSS 10. Her last normal 9 a.m. on 09/21/2019. Head CT showed no evidence of a large territorial infarct. CT angiogram showed a proximal right M2/MCA. She was taken to our service for diagnostic cerebral angiogram and mechanical thrombectomy. EXAM: Diagnostic cerebral angiogram Mechanical thrombectomy, right MCA/M2 occlusion Flat panel head CT COMPARISON:  CT/CT angiogram  of the head and neck Sep 21, 2019 MEDICATIONS: 5 mg of verapamil given intra-arterially to the right ICA. ANESTHESIA/SEDATION: The procedure was performed under general anesthesia. FLUOROSCOPY TIME:  Fluoroscopy Time: 7 minutes 48 seconds (414 mGy). COMPLICATIONS: None immediate. TECHNIQUE: Informed written consent was waved as patient was obtained unable to consent for herself and no healthcare proxy or next of kin was identified in time. Discussion with neuro hospitalist held. We agree that going forward with intervention was in the best interest of the patient. Maximal Sterile Barrier Technique was utilized including caps, mask, sterile gowns, sterile gloves, sterile drape, hand hygiene and skin antiseptic. A timeout was performed prior to the initiation of the procedure. The right groin was prepped and draped in the usual sterile fashion. Using a micropuncture kit and the modified Seldinger technique, access was gained to the right common femoral artery and an 8 French sheath was placed. Then, an [place] balloon guide  catheter was navigated over a 6 [place] Berenstein 2 and a 0.035 inch Terumo Glidewire into the aortic arch. Under fluoroscopy, the catheter was placed into the right common carotid artery and then advanced to the cervical right internal carotid artery. The Berenstein catheter and the Glidewire were removed. Frontal and lateral angiograms of the upper neck and head were obtained. FINDINGS: 1. Occlusion of the proximal right M2/MCA inferior division branch. 2. Increased tortuosity of the cervical right ICA. 3. Mild atherosclerotic changes in the right carotid bulb without hemodynamically significant stenosis. 4. Ectatic cavernous segment of the right ICA. 5. Severely hypoplastic A1/ACA segment with no significant opacification of the A2 segment. PROCEDURE: Using biplane roadmap, a catalyst 6 aspiration catheter was navigated over a phenom 21 microcatheter and a synchro support flex microguidewire into the cavernous segment of the right ICA. This phenom microcatheter was then advanced into the proximal M3/MCA inferior division branch. A 5 x 37 mm Embotrap III thrombectomy device was deployed spanning the distal M1 and the entire length of the M2 segment. The device was allowed to intercalated with the clot for 4 minutes. The microcatheter was removed under fluoroscopy. The aspiration catheter was advanced to the level of occlusion and connected to an aspiration pump. The guiding catheter balloon was inflated and the thrombectomy device plus aspiration catheter were removed en bloc under constant aspiration. The guiding catheter was aspirated. Frontal and lateral angiograms of the head were obtained via guide catheter contrast injection. Complete recanalization of the right MCA was seen (TICI 3). Mild device induced vasospasm was noted. Intra arterial infusion of 5 mg of verapamil was performed over 10 minutes. Follow-up angiograms showed vasospasm resolution. No embolus to new territory seen. Flat panel CT of the head  was obtained and post processed in a separate workstation with concurrent attending physician supervision. Selected images were sent to PACS. No evidence of hemorrhagic complication seen. A right common femoral artery angiogram was obtained with right anterior oblique view. The puncture is at the level of the common femoral artery. No significant atherosclerotic disease, plaques are stenosis seen. The femoral sheath was exchanged over the wire for a 6 French Perclose ProGlide which was utilized for access closure. Immediate hemostasis was achieved. IMPRESSION: 1. Successful mechanical thrombectomy for treatment of a proximal right M2/MCA inferior division branch occlusion with complete recanalization (TICI3) after 1 pass with combined aspiration and stent retriever (ARTS technique). 2. No thromboembolic or hemorrhagic complication. PLAN: 1. Patient remained intubated given a positive COVID test. 2. Bed rest with no hip flexion  for 6 hours. 3. Further management per Neurology and CCM. Electronically Signed   By: Pedro Earls M.D.   On: 09/21/2019 14:38   DG CHEST PORT 1 VIEW  Result Date: 09/21/2019 CLINICAL DATA:  Acute hypoxemic respiratory failure.  Extubation EXAM: PORTABLE CHEST 1 VIEW COMPARISON:  04/16/2018 FINDINGS: Cardiac enlargement without heart failure. Mild scarring in the lung bases unchanged. No acute infiltrate or effusion. Single lead pacemaker lead in the right ventricle unchanged from the prior study. Thoracic dextro scoliosis. IMPRESSION: Chronic lung disease with mild scarring in the bases. No acute abnormality. Electronically Signed   By: Franchot Gallo M.D.   On: 09/21/2019 15:02   ECHOCARDIOGRAM COMPLETE  Result Date: 09/22/2019    ECHOCARDIOGRAM REPORT   Patient Name:   [name] Date of Exam: 09/22/2019 Medical Rec #:  [id number]        Height:       67.0 in Accession #:    [id number]       Weight:       213.0 lb Date of Birth:  06-15-1927       BSA:           2.077 m Patient Age:    14 years         BP:           116/61 mmHg Patient Gender: F                HR:           63 bpm. Exam Location:  Inpatient Procedure: 2D Echo, Cardiac Doppler and Color Doppler Indications:    Stroke 434.91 / I163.9  History:        Patient has prior history of Echocardiogram examinations, most                 recent 12/30/2012. CHF, CAD, Pacemaker, Stroke; Risk                 Factors:Dyslipidemia and Non-Smoker. Breast cancer.  Sonographer:    Vickie Epley RDCS Referring Phys: [place] Comments: Covid positive. IMPRESSIONS  1. Left ventricular ejection fraction, by estimation, is 55 to 60%. The left ventricle has normal function. The left ventricle has no regional wall motion abnormalities. Left ventricular diastolic function could not be evaluated.  2. Right ventricular systolic function is normal. The right ventricular size is normal. There is mildly elevated pulmonary artery systolic pressure. The estimated right ventricular systolic pressure is 06.3 mmHg.  3. The mitral valve is grossly normal. No evidence of mitral valve regurgitation. No evidence of mitral stenosis.  4. The aortic valve is tricuspid. Aortic valve regurgitation is not visualized. Mild aortic valve sclerosis is present, with no evidence of aortic valve stenosis.  5. The inferior vena cava is dilated in size with <50% respiratory variability, suggesting right atrial pressure of 15 mmHg. FINDINGS  Left Ventricle: Left ventricular ejection fraction, by estimation, is 55 to 60%. The left ventricle has normal function. The left ventricle has no regional wall motion abnormalities. The left ventricular internal cavity size was normal in size. There is  no left ventricular hypertrophy. Abnormal (paradoxical) septal motion, consistent with RV pacemaker. Left ventricular diastolic function could not be evaluated due to paced rhythm. Left ventricular diastolic function could not be evaluated. Right Ventricle:  The right ventricular size is normal. No increase in right ventricular wall thickness. Right ventricular systolic function is normal. There is mildly elevated pulmonary  artery systolic pressure. The tricuspid regurgitant velocity is 2.65  m/s, and with an assumed right atrial pressure of 15 mmHg, the estimated right ventricular systolic pressure is 54.2 mmHg. Left Atrium: Left atrial size was normal in size. Right Atrium: Right atrial size was normal in size. Pericardium: Trivial pericardial effusion is present. Presence of pericardial fat pad. Mitral Valve: The mitral valve is grossly normal. No evidence of mitral valve regurgitation. No evidence of mitral valve stenosis. Tricuspid Valve: The tricuspid valve is grossly normal. Tricuspid valve regurgitation is trivial. No evidence of tricuspid stenosis. Aortic Valve: The aortic valve is tricuspid. Aortic valve regurgitation is not visualized. Mild aortic valve sclerosis is present, with no evidence of aortic valve stenosis. Pulmonic Valve: The pulmonic valve was grossly normal. Pulmonic valve regurgitation is trivial. No evidence of pulmonic stenosis. Aorta: The aortic root is normal in size and structure. Venous: The inferior vena cava is dilated in size with less than 50% respiratory variability, suggesting right atrial pressure of 15 mmHg. IAS/Shunts: The atrial septum is grossly normal. Additional Comments: A pacer wire is visualized in the right atrium and right ventricle.  LEFT VENTRICLE PLAX 2D LVIDd:         5.10 cm LV PW:         1.05 cm LV IVS:        0.82 cm LVOT diam:     2.40 cm LV SV:         89 LV SV Index:   43 LVOT Area:     4.52 cm  LV Volumes (MOD) LV vol d, MOD A2C: 66.5 ml LV vol d, MOD A4C: 70.6 ml LV vol s, MOD A2C: 46.7 ml LV vol s, MOD A4C: 39.5 ml LV SV MOD A2C:     19.8 ml LV SV MOD A4C:     70.6 ml LV SV MOD BP:      27.0 ml RIGHT VENTRICLE TAPSE (M-mode): 1.2 cm LEFT ATRIUM             Index       RIGHT ATRIUM           Index LA Vol  (A2C):   38.3 ml 18.44 ml/m RA Area:     15.30 cm LA Vol (A4C):   44.8 ml 21.57 ml/m RA Volume:   36.10 ml  17.38 ml/m LA Biplane Vol: 41.8 ml 20.13 ml/m  AORTIC VALVE LVOT Vmax:   97.30 cm/s LVOT Vmean:  64.700 cm/s LVOT VTI:    0.197 m  AORTA Ao Root diam: 3.10 cm TRICUSPID VALVE TR Peak grad:   28.1 mmHg TR Vmax:        265.00 cm/s  SHUNTS Systemic VTI:  0.20 m Systemic Diam: 2.40 cm Eleonore Chiquito MD Electronically signed by Eleonore Chiquito MD Signature Date/Time: 09/22/2019/2:47:33 PM    Final    IR PERCUTANEOUS ART THROMBECTOMY/INFUSION INTRACRANIAL INC DIAG ANGIO  Result Date: 09/21/2019 INDICATION: 86 year old female with past medical history significant for thyroid disease, orthostatic hypotension, hypercholesterolemia, delusional disorder, complete heart block status post pacer, chronic congestive heart failure with a baseline modified Rankin scale 0. She presented to emergency with right gaze preference and left-sided weakness, NIHSS 10. Her last normal 9 a.m. on 09/21/2019. Head CT showed no evidence of a large territorial infarct. CT angiogram showed a proximal right M2/MCA. She was taken to our service for diagnostic cerebral angiogram and mechanical thrombectomy. EXAM: Diagnostic cerebral angiogram Mechanical thrombectomy, right MCA/M2 occlusion Flat panel head CT COMPARISON:  CT/CT angiogram of the head and neck Sep 21, 2019 MEDICATIONS: 5 mg of verapamil given intra-arterially to the right ICA. ANESTHESIA/SEDATION: The procedure was performed under general anesthesia. FLUOROSCOPY TIME:  Fluoroscopy Time: 7 minutes 48 seconds (414 mGy). COMPLICATIONS: None immediate. TECHNIQUE: Informed written consent was waved as patient was obtained unable to consent for herself and no healthcare proxy or next of kin was identified in time. Discussion with neuro hospitalist held. We agree that going forward with intervention was in the best interest of the patient. Maximal Sterile Barrier Technique was  utilized including caps, mask, sterile gowns, sterile gloves, sterile drape, hand hygiene and skin antiseptic. A timeout was performed prior to the initiation of the procedure. The right groin was prepped and draped in the usual sterile fashion. Using a micropuncture kit and the modified Seldinger technique, access was gained to the right common femoral artery and an 8 French sheath was placed. Then, an [place] balloon guide catheter was navigated over a 6 [place] Berenstein 2 and a 0.035 inch Terumo Glidewire into the aortic arch. Under fluoroscopy, the catheter was placed into the right common carotid artery and then advanced to the cervical right internal carotid artery. The Berenstein catheter and the Glidewire were removed. Frontal and lateral angiograms of the upper neck and head were obtained. FINDINGS: 1. Occlusion of the proximal right M2/MCA inferior division branch. 2. Increased tortuosity of the cervical right ICA. 3. Mild atherosclerotic changes in the right carotid bulb without hemodynamically significant stenosis. 4. Ectatic cavernous segment of the right ICA. 5. Severely hypoplastic A1/ACA segment with no significant opacification of the A2 segment. PROCEDURE: Using biplane roadmap, a catalyst 6 aspiration catheter was navigated over a phenom 21 microcatheter and a synchro support flex microguidewire into the cavernous segment of the right ICA. This phenom microcatheter was then advanced into the proximal M3/MCA inferior division branch. A 5 x 37 mm Embotrap III thrombectomy device was deployed spanning the distal M1 and the entire length of the M2 segment. The device was allowed to intercalated with the clot for 4 minutes. The microcatheter was removed under fluoroscopy. The aspiration catheter was advanced to the level of occlusion and connected to an aspiration pump. The guiding catheter balloon was inflated and the thrombectomy device plus aspiration catheter were removed en bloc under  constant aspiration. The guiding catheter was aspirated. Frontal and lateral angiograms of the head were obtained via guide catheter contrast injection. Complete recanalization of the right MCA was seen (TICI 3). Mild device induced vasospasm was noted. Intra arterial infusion of 5 mg of verapamil was performed over 10 minutes. Follow-up angiograms showed vasospasm resolution. No embolus to new territory seen. Flat panel CT of the head was obtained and post processed in a separate workstation with concurrent attending physician supervision. Selected images were sent to PACS. No evidence of hemorrhagic complication seen. A right common femoral artery angiogram was obtained with right anterior oblique view. The puncture is at the level of the common femoral artery. No significant atherosclerotic disease, plaques are stenosis seen. The femoral sheath was exchanged over the wire for a 6 French Perclose ProGlide which was utilized for access closure. Immediate hemostasis was achieved. IMPRESSION: 1. Successful mechanical thrombectomy for treatment of a proximal right M2/MCA inferior division branch occlusion with complete recanalization (TICI3) after 1 pass with combined aspiration and stent retriever (ARTS technique). 2. No thromboembolic or hemorrhagic complication. PLAN: 1. Patient remained intubated given a positive COVID test. 2. Bed rest with no  hip flexion for 6 hours. 3. Further management per Neurology and CCM. Electronically Signed   By: Pedro Earls M.D.   On: 09/21/2019 14:38   CT HEAD CODE STROKE WO CONTRAST  Result Date: 09/21/2019 CLINICAL DATA:  Code stroke.  Confusion, left-sided weakness EXAM: CT HEAD WITHOUT CONTRAST TECHNIQUE: Contiguous axial images were obtained from the base of the skull through the vertex without intravenous contrast. COMPARISON:  CT head 04/02/2019 FINDINGS: Brain: Moderate atrophy. Patchy white matter hypodensity bilaterally consistent with chronic  microvascular ischemia. Negative for acute infarct, hemorrhage, mass Vascular: Hyperdense right MCA in the sylvian fissure involving M2 branches. This is consistent with acute thrombus and was not present previously. No other acute vascular abnormality. Skull: Negative Sinuses/Orbits: Prior sinus surgery. Mucosal edema in the paranasal sinuses. No orbital lesion.  Bilateral cataract extraction. Other: None ASPECTS ([place] Stroke Program Early CT Score) - Ganglionic level infarction (caudate, lentiform nuclei, internal capsule, insula, M1-M3 cortex): 7 - Supraganglionic infarction (M4-M6 cortex): 3 Total score (0-10 with 10 being normal): 10 IMPRESSION: 1. Hyperdense right M2 branches compatible with acute thrombus. Negative for acute infarct or hemorrhage 2. ASPECTS is 10 3. Preliminary results texted to Dr. Lucretia Kern Electronically Signed   By: Franchot Gallo M.D.   On: 09/21/2019 10:17   CT ANGIO HEAD CODE STROKE  Result Date: 09/21/2019 CLINICAL DATA:  86 year old female code stroke presentation with left side weakness. Hyperdense right MCA M2 branches on plain head CT 1008 hours today. EXAM: CT ANGIOGRAPHY HEAD AND NECK TECHNIQUE: Multidetector CT imaging of the head and neck was performed using the standard protocol during bolus administration of intravenous contrast. Multiplanar CT image reconstructions and MIPs were obtained to evaluate the vascular anatomy. Carotid stenosis measurements (when applicable) are obtained utilizing NASCET criteria, using the distal internal carotid diameter as the denominator. CONTRAST:  31m OMNIPAQUE IOHEXOL 350 MG/ML SOLN COMPARISON:  plain head CT 1008 hours today. FINDINGS: CTA NECK Skeleton: Widespread cervical spine degeneration. 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 Left TMJ degeneration. No acute osseous abnormality identified. Upper chest: Left-side cardiac pacemaker type device. Otherwise negative. Other neck: Small bilateral mixed  density thyroid nodules do not meet size criteria for follow-up (ref: J Am Coll Radiol. 2015 Feb;12(2): 143-50).No acute findings. Aortic arch: 3 vessel arch configuration. Mild for age arch atherosclerosis. Right carotid system: Tortuous brachiocephalic artery and proximal right CCA with mild plaque but no stenosis. Mild calcified plaque at the right ICA origin, soft and calcified plaque at the bulb. But no associated stenosis. Mildly tortuous right ICA. Left carotid system: Tortuous proximal left CCA without stenosis. Mild calcified plaque at the left ICA origin and bulb without stenosis. Ectatic left ICA just below the skull base. Vertebral arteries: Mildly tortuous proximal right subclavian artery with no plaque or stenosis. Normal right vertebral artery origin. Tortuous right V1 segment. The right vertebral is tortuous throughout the neck but patent to the skull base without stenosis. Normal proximal left subclavian artery and left vertebral artery origin. Tortuous left vertebral artery in the neck with no significant plaque or stenosis. CTA HEAD Posterior circulation: Mildly dominant right V4 segment. No distal vertebral plaque or stenosis. Patent PICA origins. Tortuous vertebrobasilar junction is patent without stenosis. The basilar artery is patent but diminutive, functionally terminating in the SCA is. Fetal type bilateral PCA origins. Tortuous posterior communicating arteries. But bilateral PCA branches are within normal limits. Anterior circulation: Both ICA siphons are dolichoectatic, up to 7 mm diameter on the right  and nearly 9 mm diameter on the left (series 9, image 110). Both siphons are patent with mild to moderate calcified plaque but no siphon stenosis. Normal ophthalmic and posterior communicating artery origins. Patent mildly ectatic carotid termini. Dominant left and diminutive or absent right ACA A1 segments. Anterior communicating artery and bilateral ACA branches are within normal limits.  Left MCA M1 segment and bifurcation are patent without stenosis. Left MCA branches are within normal limits. Right MCA M1 segment bifurcates slightly early without stenosis (series 11, image 17 and series 10, image 18). However, there is occlusion of the dominant right M2 branch which initially courses anteriorly. See series 10, image 18. And this occlusion appears to occur just proximal to the bifurcation to M3 branches (series 9, image 61). Intermediate appearing right MCA branch reconstitution on these early phase images. Venous sinuses: Pain. Anatomic variants: Dominant left and diminutive or absent right ACA A1 segments. Dominant right vertebral artery and diminutive basilar on the basis of fetal PCA origins. Review of the MIP images confirms the above findings IMPRESSION: 1. 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 2. No other arterial stenosis in the head or neck. Dolichoectatic bilateral ICA siphons measuring 7-9 mm diameter. Bilateral fetal PCA origins. Mild for age proximal ICA atherosclerosis. Preliminary results of #1 were communicated to Dr. Lorraine Lax at 10:32 am on 09/21/2019 by text page via the [facility] messaging system. Electronically Signed   By: Genevie Ann M.D.   On: 09/21/2019 10:41   CT ANGIO NECK CODE STROKE  Result Date: 09/21/2019 CLINICAL DATA:  86 year old female code stroke presentation with left side weakness. Hyperdense right MCA M2 branches on plain head CT 1008 hours today. EXAM: CT ANGIOGRAPHY HEAD AND NECK TECHNIQUE: Multidetector CT imaging of the head and neck was performed using the standard protocol during bolus administration of intravenous contrast. Multiplanar CT image reconstructions and MIPs were obtained to evaluate the vascular anatomy. Carotid stenosis measurements (when applicable) are obtained utilizing NASCET criteria, using the distal internal carotid diameter as the denominator. CONTRAST:  54m OMNIPAQUE IOHEXOL 350  MG/ML SOLN COMPARISON:  plain head CT 1008 hours today. FINDINGS: CTA NECK Skeleton: Widespread cervical spine degeneration. 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 Left TMJ degeneration. No acute osseous abnormality identified. Upper chest: Left-side cardiac pacemaker type device. Otherwise negative. Other neck: Small bilateral mixed density thyroid nodules do not meet size criteria for follow-up (ref: J Am Coll Radiol. 2015 Feb;12(2): 143-50).No acute findings. Aortic arch: 3 vessel arch configuration. Mild for age arch atherosclerosis. Right carotid system: Tortuous brachiocephalic artery and proximal right CCA with mild plaque but no stenosis. Mild calcified plaque at the right ICA origin, soft and calcified plaque at the bulb. But no associated stenosis. Mildly tortuous right ICA. Left carotid system: Tortuous proximal left CCA without stenosis. Mild calcified plaque at the left ICA origin and bulb without stenosis. Ectatic left ICA just below the skull base. Vertebral arteries: Mildly tortuous proximal right subclavian artery with no plaque or stenosis. Normal right vertebral artery origin. Tortuous right V1 segment. The right vertebral is tortuous throughout the neck but patent to the skull base without stenosis. Normal proximal left subclavian artery and left vertebral artery origin. Tortuous left vertebral artery in the neck with no significant plaque or stenosis. CTA HEAD Posterior circulation: Mildly dominant right V4 segment. No distal vertebral plaque or stenosis. Patent PICA origins. Tortuous vertebrobasilar junction is patent without stenosis. The basilar artery is patent  but diminutive, functionally terminating in the SCA is. Fetal type bilateral PCA origins. Tortuous posterior communicating arteries. But bilateral PCA branches are within normal limits. Anterior circulation: Both ICA siphons are dolichoectatic, up to 7 mm diameter on the right and nearly 9 mm diameter on the left  (series 9, image 110). Both siphons are patent with mild to moderate calcified plaque but no siphon stenosis. Normal ophthalmic and posterior communicating artery origins. Patent mildly ectatic carotid termini. Dominant left and diminutive or absent right ACA A1 segments. Anterior communicating artery and bilateral ACA branches are within normal limits. Left MCA M1 segment and bifurcation are patent without stenosis. Left MCA branches are within normal limits. Right MCA M1 segment bifurcates slightly early without stenosis (series 11, image 17 and series 10, image 18). However, there is occlusion of the dominant right M2 branch which initially courses anteriorly. See series 10, image 18. And this occlusion appears to occur just proximal to the bifurcation to M3 branches (series 9, image 61). Intermediate appearing right MCA branch reconstitution on these early phase images. Venous sinuses: Pain. Anatomic variants: Dominant left and diminutive or absent right ACA A1 segments. Dominant right vertebral artery and diminutive basilar on the basis of fetal PCA origins. Review of the MIP images confirms the above findings IMPRESSION: 1. 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 2. No other arterial stenosis in the head or neck. Dolichoectatic bilateral ICA siphons measuring 7-9 mm diameter. Bilateral fetal PCA origins. Mild for age proximal ICA atherosclerosis. Preliminary results of #1 were communicated to Dr. Lorraine Lax at 10:32 am on 09/21/2019 by text page via the [facility] messaging system. Electronically Signed   By: Genevie Ann M.D.   On: 09/21/2019 10:41      HISTORY OF PRESENT ILLNESS [name] is a 86 y.o. female with history of thyroid disease, orthostatic hypotension, hypercholesterolemia, delusional disorder, complete heart block status post pacer, chronic congestive heart failure.  Per EMS [facility].  At 9:30 AM on  09/21/2019 (LKW) they noted that she became confused with left arm and left leg decreased movement.  Patient was lowered to the ground.  They also noticed that she had a right gaze preference.  Blood pressure at that time was 151/90 and she was paced at 70 bpm.  Patient was brought to [facility] as code stroke.  Patient CT of brain did show a right M2 occlusion.  Patient did receive TPA. Premorbid modified Rankin scale (mRS): 0. NIH stroke score of 10.   HOSPITAL COURSE [name] is a 86 y.o. female with history of thyroid disease, orthostatic hypotension, hypercholesterolemia, delusional disorder, complete heart block status post pacer, chronic congestive heart failure presenting with confusion with L arm and L leg weakness. Received IV tPA 09/21/19 at 1021. Found to have a hyperdense L M2. Taken to IR.   Stroke:  Aborted R MCA infarct s/p tPA and IR w/ TICI3 revascularization, embolic secondary to unknown source  Code Stroke CT head hyperdense R M2. No hemorrhage. ASPECTS 10.     CTA head & neck R MCA M2 occlusion. B ICQ siphons dolichoectatic. B fetal PCAs. Mild proximal ICA atherosclerosis.  Cerebral angio proximal R M2 occlusion s/p mechanical thrombectomy w/ embotrap and aspiration. TICI3 revascularization    Post IR CT Unremarkable   CT no acute ICH, no stroke  2D Echo EF 55-60%. No source of embolus   LDL  89  HgbA1c 6.1  No antithrombotic prior to admission, add aspirin and plavix x 3 weeks then aspirin alone   Therapy recommendations:  HH OT, HH PT   Disposition:  return home   Covid virus detected, asymptomatic  Received 1st dose vaccine 3 weeks ago  COVID + incidental finding  No indication for further COVID treatment  Orthostatic Hypotension  Home meds:  midodrine  BP goal normotensive  Hyperlipidemia  Home meds:  No statin  Add lipitor 10 given advanced age and near goal LDL  LDL 89, goal < 70  Continue statin at  discharge  Other Stroke Risk Factors  Advanced age  Obesity, Body mass index is 33.35 kg/m., recommend weight loss, diet and exercise as appropriate   Hx Stroke ? 09/2018 - Incidental cerebral vermis hemorrhage, source unclear, not felt to be hypertensive. Possibly a cavernoma. Unable to do MRI due to incompatible pacemaker  Hx Congestive heart failure  Heart block w/ pacer  Other Active Problems  Hx B breast cancer  Thyroid disease  Delusional disorder  DISCHARGE EXAM Blood pressure 111/70, pulse 64, temperature 97.6 F (36.4 C), resp. rate 18, height _0  (1.702 m), weight 96.6 kg, SpO2 97 %. Pleasant elderly Caucasian lady sitting up comfortably in a chair.  Not in distress. . Afebrile. Head is nontraumatic. Neck is supple without bruit.    Cardiac exam no murmur or gallop. Lungs are clear to auscultation. Distal pulses are well felt. Neurological Exam ;  Awake  Alert oriented x 3. Normal speech and language.eye movements full without nystagmus.fundi were not visualized. Vision acuity and fields appear normal. Hearing is normal. Palatal movements are normal. Face symmetric. Tongue midline. Normal strength, tone, reflexes and coordination. Normal sensation. Gait deferred.  Discharge Diet   Heart health thin liquids  DISCHARGE PLAN  Disposition:  Return home  [place] PT and OT  aspirin 81 mg daily and clopidogrel 75 mg daily for secondary stroke prevention for 3 weeks then aspirin alone.  Ongoing stroke risk factor control by Primary Care Physician at time of discharge  [place] notified for need of 30d monitor to look for AF as source of stroke  Follow-up PCP [place], [name], MD in 1 week.  Follow-up in [facility] in 4 weeks, office to schedule an appointment.   Follow covid isolation procedures as currently recommended  35 minutes were spent preparing discharge.  Burnetta Sabin, MSN, APRN, ANVP-BC,  AGPCNP-BC Advanced Practice Stroke Nurse [place] for Schedule & Pager information 09/23/2019 3:02 PM   I have personally obtained history,examined this patient, reviewed notes, independently viewed imaging studies, participated in medical decision making and plan of care.ROS completed by me personally and pertinent positives fully documented  I have made any additions or clarifications directly to the above note. Agree with note above.    Antony Contras, MD Medical Director [facility] Pager: [phone] 09/23/2019 4:14 PM

## 2019-09-23 NOTE — Discharge Instructions (Signed)
Per Dr. Candiss Norse, patient is to remain in isolation for 2 weeks.  Patient may resume regular activities after isolation.  Patient can get second dose of the COVID Vaccine 3 months from today.

## 2019-09-24 NOTE — Progress Notes (Signed)
Patient currently leaving with [facility].  All belongings sent with patient.   

## 2019-09-25 NOTE — Progress Notes (Signed)
Later entry for missed modified rankin score.  Score based on review of the medical record.    09/24/19 1616  Modified Rankin (Stroke Patients Only)  Pre-Morbid Rankin Score 0  Modified Rankin [place], [place]   Acute Rehabilitation Services  Pager [phone] Office [phone] 09/25/2019

## 2019-10-01 DIAGNOSIS — M8580 Other specified disorders of bone density and structure, unspecified site: Secondary | ICD-10-CM | POA: Diagnosis not present

## 2019-10-01 DIAGNOSIS — I872 Venous insufficiency (chronic) (peripheral): Secondary | ICD-10-CM | POA: Diagnosis not present

## 2019-10-01 DIAGNOSIS — E039 Hypothyroidism, unspecified: Secondary | ICD-10-CM | POA: Diagnosis not present

## 2019-10-01 DIAGNOSIS — E785 Hyperlipidemia, unspecified: Secondary | ICD-10-CM | POA: Diagnosis not present

## 2019-10-01 DIAGNOSIS — I442 Atrioventricular block, complete: Secondary | ICD-10-CM | POA: Diagnosis not present

## 2019-10-01 DIAGNOSIS — L97412 Non-pressure chronic ulcer of right heel and midfoot with fat layer exposed: Secondary | ICD-10-CM | POA: Diagnosis not present

## 2019-10-05 DIAGNOSIS — E039 Hypothyroidism, unspecified: Secondary | ICD-10-CM | POA: Diagnosis not present

## 2019-10-05 DIAGNOSIS — E785 Hyperlipidemia, unspecified: Secondary | ICD-10-CM | POA: Diagnosis not present

## 2019-10-05 DIAGNOSIS — I442 Atrioventricular block, complete: Secondary | ICD-10-CM | POA: Diagnosis not present

## 2019-10-05 DIAGNOSIS — M8580 Other specified disorders of bone density and structure, unspecified site: Secondary | ICD-10-CM | POA: Diagnosis not present

## 2019-10-05 DIAGNOSIS — L97412 Non-pressure chronic ulcer of right heel and midfoot with fat layer exposed: Secondary | ICD-10-CM | POA: Diagnosis not present

## 2019-10-05 DIAGNOSIS — I872 Venous insufficiency (chronic) (peripheral): Secondary | ICD-10-CM | POA: Diagnosis not present

## 2019-10-06 ENCOUNTER — Telehealth: Payer: Self-pay | Admitting: *Deleted

## 2019-10-06 ENCOUNTER — Encounter: Payer: Self-pay | Admitting: *Deleted

## 2019-10-06 NOTE — Telephone Encounter (Signed)
Attempted to contact patient regarding 30 day cardiac event monitor.  No answer, mailbox full. Patient will be enrolled for [employer] to ship a 30 day cardiac event monitor to her home.  Letter with instructions to be mailed to patients home.

## 2019-10-07 ENCOUNTER — Other Ambulatory Visit: Payer: Self-pay

## 2019-10-08 ENCOUNTER — Encounter: Payer: Self-pay | Admitting: Family Medicine

## 2019-10-08 ENCOUNTER — Ambulatory Visit (INDEPENDENT_AMBULATORY_CARE_PROVIDER_SITE_OTHER): Payer: Medicare Other | Admitting: Family Medicine

## 2019-10-08 VITALS — BP 124/82 | HR 88 | Temp 97.4°F | Resp 16 | Ht 67.0 in | Wt 209.0 lb

## 2019-10-08 DIAGNOSIS — L97912 Non-pressure chronic ulcer of unspecified part of right lower leg with fat layer exposed: Secondary | ICD-10-CM

## 2019-10-08 DIAGNOSIS — E039 Hypothyroidism, unspecified: Secondary | ICD-10-CM

## 2019-10-08 DIAGNOSIS — E876 Hypokalemia: Secondary | ICD-10-CM | POA: Diagnosis not present

## 2019-10-08 DIAGNOSIS — I951 Orthostatic hypotension: Secondary | ICD-10-CM | POA: Diagnosis not present

## 2019-10-08 DIAGNOSIS — N1832 Chronic kidney disease, stage 3b: Secondary | ICD-10-CM | POA: Diagnosis not present

## 2019-10-08 DIAGNOSIS — I442 Atrioventricular block, complete: Secondary | ICD-10-CM | POA: Diagnosis not present

## 2019-10-08 DIAGNOSIS — E785 Hyperlipidemia, unspecified: Secondary | ICD-10-CM | POA: Diagnosis not present

## 2019-10-08 DIAGNOSIS — I639 Cerebral infarction, unspecified: Secondary | ICD-10-CM | POA: Diagnosis not present

## 2019-10-08 DIAGNOSIS — I872 Venous insufficiency (chronic) (peripheral): Secondary | ICD-10-CM

## 2019-10-08 DIAGNOSIS — L97412 Non-pressure chronic ulcer of right heel and midfoot with fat layer exposed: Secondary | ICD-10-CM | POA: Diagnosis not present

## 2019-10-08 DIAGNOSIS — M8580 Other specified disorders of bone density and structure, unspecified site: Secondary | ICD-10-CM | POA: Diagnosis not present

## 2019-10-08 NOTE — Progress Notes (Signed)
Chief Complaint  Patient presents with  . Hospitalization Follow-up   HPI: [name] is a 86 y.o. female with hx of complete heart block (s/p pacemaker placement),CHF,and delusional disorder here today to follow on recent hospitalization.  She was admitted on 09/21/2019 and discharged home 09/23/2019. [facility], in line waiting for her second COVID 19 vaccine and to pick a Rx, she became confused and left-sided weakness + right gaze preference were noted. BP was elevated at 151/90 and paced HR 70/min.  Head and neck CTA:1. Hyperdense right M2 branches compatible with acute thrombus.Negative for acute infarct or hemorrhage. No other arterial stenosis in the head or neck. Dolichoectatic bilateral ICA siphons measuring 7-9 mm diameter. Bilateral fetal PCA origins. Mild for age proximal ICA atherosclerosis. She received TPA.  Echo: LVEF 55-60%, no source of embolus. She denies residual weakness. She feels like she is back to her baseline.  HypoK+: She is not taking KCL 20 meq daily but rather once per week. She increased K+ rich diet intake.  Lab Results  Component Value Date   CREATININE 1.35 (H) 09/23/2019   BUN 25 (H) 09/23/2019   NA 142 09/23/2019   K 4.4 09/23/2019   CL 108 09/23/2019   CO2 21 (L) 09/23/2019   Lab Results  Component Value Date   WBC 9.1 09/21/2019   HGB 14.3 09/21/2019   HCT 42.0 09/21/2019   MCV 97.7 09/21/2019   PLT 233 09/21/2019   COVID 19 test was positive, she has been asymptomatic.  Discharged home. She is receiving [place] services, PT and OT were added. Due to venous ulcers she already has wound care 2 times per week. Currently she is on Plavix 75 mg daily to complete 3 weeks and Aspirin 81 mg daily. Planning on cardiac monitor for possible atrial fib as the etiologic factor for thrombus formation.  She has not noted severe/frequent headache, visual changes, chest pain, dyspnea, palpitation, focal weakness, or worsening  edema.  Atorvastatin 10 mg was added. Lab Results  Component Value Date   CHOL 156 09/22/2019   HDL 58 09/22/2019   LDLCALC 89 09/22/2019   TRIG 45 09/22/2019   CHOLHDL 2.7 09/22/2019   Hypothyroidism: Levothyroxine dose was not changed, she is taking 50 mcg daily.  Lab Results  Component Value Date   TSH 2.72 09/17/2019   Orthostatic hypotension: Still on Midodrine 2.5 mg daily. Nurse visits,checking BP at home a few times per week.  Venous stasis dermatitis: She is taking Furosemide 10 mg daily.  Toviaz 4 mg was added last visit to help with urinary frequency. Reporting improvement of urinary symptoms and no side effects. Denies dysuria,gross hematuria,decreased urine output,and foam in urine.  Review of Systems  Constitutional: Negative for activity change, appetite change, fatigue and fever.  HENT: Negative for mouth sores, nosebleeds and sore throat.   Respiratory: Negative for cough and wheezing.   Cardiovascular: Positive for leg swelling.  Gastrointestinal: Negative for abdominal pain, nausea and vomiting.       Negative for changes in bowel habits.  Endocrine: Negative for cold intolerance and heat intolerance.  Musculoskeletal: Positive for arthralgias and gait problem.  Skin: Positive for wound.  Neurological: Negative for syncope, facial asymmetry and speech difficulty.  Psychiatric/Behavioral: Negative for confusion. The patient is nervous/anxious.   Rest see pertinent positives and negatives per HPI.  Current Outpatient Medications on File Prior to Visit  Medication Sig Dispense Refill  . aspirin EC 81 MG EC  tablet Take 1 tablet (81 mg total) by mouth daily.    Marland Kitchen atorvastatin (LIPITOR) 10 MG tablet Take 1 tablet (10 mg total) by mouth daily. 30 tablet 2  . Calcium Carbonate-Vitamin D (CALTRATE 600+D PO) Take 1 tablet by mouth daily after breakfast.     . clopidogrel (PLAVIX) 75 MG tablet Take 1 tablet (75 mg total) by mouth daily. Stop taking after 21 days  and continue aspirin 21 tablet 0  . fesoterodine (TOVIAZ) 4 MG TB24 tablet Take 1 tablet (4 mg total) by mouth daily. 30 tablet 2  . furosemide (LASIX) 20 MG tablet Take 0.5 tablets (10 mg total) by mouth daily. 45 tablet 2  . Lactobacillus Rhamnosus, GG, (CULTURELLE PO) Take 1 capsule by mouth every morning.    . latanoprost (XALATAN) 0.005 % ophthalmic solution Place 1 drop into both eyes at bedtime.   10  . levothyroxine (SYNTHROID) 50 MCG tablet Take 1 tablet (50 mcg total) by mouth daily before breakfast. 90 tablet 2  . midodrine (PROAMATINE) 2.5 MG tablet Take 1 tablet (2.5 mg total) by mouth daily after breakfast. 90 tablet 2  . Potassium Chloride ER 20 MEQ TBCR Take 20 mEq by mouth daily. 30 tablet 4   No current facility-administered medications on file prior to visit.   Past Medical History:  Diagnosis Date  . Cataract   . Chronic congestive heart failure, unspecified heart failure type ([place]) 09/19/2019  . Complete heart block (HCC)    s/p PPM implant (MDT) by Dr Blanch Media.  Atrial lead could not be paced at time of the procedure.  She has chronic AV dysociation  . Delusional disorder ([place])   . Exogenous obesity   . Hypercholesterolemia   . Invasive ductal carcinoma of breast ([place]) 03/2007   BILATERAL BREASTS  . Orthostatic hypotension    treated with midodrine by Dr Rollene [name]  . PONV (postoperative nausea and vomiting)   . Thyroid disease   . Venous insufficiency    Allergies  Allergen Reactions  . Amoxicillin-Pot Clavulanate Diarrhea and Nausea And Vomiting    Has patient had a PCN reaction causing immediate rash, facial/tongue/throat swelling, SOB or lightheadedness with hypotension: Yes Has patient had a PCN reaction causing severe rash involving mucus membranes or skin necrosis: No Has patient had a PCN reaction that required hospitalization: No Has patient had a PCN reaction occurring within the last 10 years: Yes If all of the above answers are "NO", then may proceed  with Cephalosporin use.   . Erythromycin Hives  . Tape Other (See Comments)    BAND AIDS-SKIN IRRITATION  . Codeine Nausea And Vomiting  . Doxycycline Diarrhea  . Flagyl [Metronidazole] Hives  . Macrobid [Nitrofurantoin Macrocrystal] Nausea And Vomiting  . Tolterodine Nausea And Vomiting  . Ciprofloxacin Hives and Rash    Social History   Socioeconomic History  . Marital status: Widowed    Spouse name: Not on file  . Number of children: Not on file  . Years of education: Not on file  . Highest education level: Not on file  Occupational History  . Not on file  Tobacco Use  . Smoking status: Never Smoker  . Smokeless tobacco: Never Used  Vaping Use  . Vaping Use: Never used  Substance and Sexual Activity  . Alcohol use: No    Alcohol/week: 0.0 standard drinks  . Drug use: No  . Sexual activity: Not Currently  Other Topics Concern  . Not on file  Social History Narrative  .  Not on file   Social Determinants of Health   Financial Resource Strain:   . Difficulty of Paying Living Expenses:   Food Insecurity:   . Worried About Charity fundraiser in the Last Year:   . Arboriculturist in the Last Year:   Transportation Needs:   . Film/video editor (Medical):   Marland Kitchen Lack of Transportation (Non-Medical):   Physical Activity:   . Days of Exercise per Week:   . Minutes of Exercise per Session:   Stress:   . Feeling of Stress :   Social Connections:   . Frequency of Communication with Friends and Family:   . Frequency of Social Gatherings with Friends and Family:   . Attends Religious Services:   . Active Member of Clubs or Organizations:   . Attends Archivist Meetings:   Marland Kitchen Marital Status:     Vitals:   10/08/19 1141  BP: 124/82  Pulse: 88  Resp: 16  Temp: (!) 97.4 F (36.3 C)  SpO2: 98%   Body mass index is 32.73 kg/m.  Physical Exam  Nursing note and vitals reviewed. Constitutional: She is oriented to person, place, and time. She appears  well-developed. No distress.  HENT:  Head: Normocephalic and atraumatic.  Eyes: Pupils are equal, round, and reactive to light. Conjunctivae are normal.  Cardiovascular: Normal rate and regular rhythm.  No murmur heard. Pulses:      Dorsalis pedis pulses are 2+ on the right side and 2+ on the left side.  Respiratory: Effort normal and breath sounds normal. No respiratory distress.  GI: Soft. Normal appearance. She exhibits no mass. There is no hepatomegaly. There is no abdominal tenderness.  Musculoskeletal:     Right lower leg: Edema present.     Left lower leg: Edema present.       Feet:     Comments: 2+ pitting LE edema,bilateral.  Lymphadenopathy:    She has no cervical adenopathy.  Neurological: She is alert and oriented to person, place, and time. No cranial nerve deficit.  Unstable gait assisted by a 4 leg cane.  Skin: Skin is warm. Ecchymosis (left arm, above lateral epicondyle.) noted. No erythema.  Erythema and thick crusty area on pretibial area,bilateral.No tenderness or local heat.  Psychiatric:  Well groomed, good eye contact.    ASSESSMENT AND PLAN:  Ms. Tattiana was seen today for hospitalization follow-up.  Diagnoses and all orders for this visit:  Stroke aborted by administration of thrombolytic agent [facility]) She is aware of appt with neuro. Continue Plavix 75 mg daily to complete 3 weeks then continue with Aspirin 81 mg daily. We discussed the purpose of medication and side effects.  Stage 3b chronic kidney disease AKI. She is not interested in having labs today. Adequate hydration,low salt diet,and continue avoiding NSAID's.. Instructed about warning signs.  Orthostatic hypotension BP adequately controlled. BP is being monitored at home. Continue Midodrine 2.5 mg daily.  Venous stasis dermatitis of both lower extremities Stable. Continue appropriate skin care. She is applying Triamcinolone cream daily as needed. Difficult to wear compression  stocking. Continue Furosemide 10 mg daily.  Ulcer of right lower extremity with fat layer exposed ([place]) Chronic. Stable. Following with podiatrist every 6 weeks.  Hypothyroidism, unspecified type Well controlled. No changes in current management.  Hypokalemia K+ 4.4 upon discharge. She refused having labs today. For now continue KCL 20 meq weekly and K+ rich diet.   She lives 50 min from here. She drives, so  I am concerned about safety, I have recommended to establish with a provider closer to her. She lives alone and doe snot want to move to a smaller place,senior community. She feels safe at home, appreciates that she has somebody from [facility] at home monitoring VS and wound + doing PT; so she feels safe at home. Pacemaker check q 3 months. Next appt with Dr Peer [place] in 04/2020,she remembers she is supposed to call in 01/2020 to arrange appt. She would like not to change PCP for now, next appt is scheduled for 02/2020.   Rupa Lagan G. [place], MD  [facility]. [place] office.

## 2019-10-08 NOTE — Patient Instructions (Signed)
A few things to remember from today's visit:  No changes today. Keep appt with neuro. I will see you back in November. If you decide to change to a closer place,please let me know.Be careful with driving.  If you need refills please call your pharmacy. Do not use My Chart to request refills or for acute issues that need immediate attention.    Please be sure medication list is accurate. If a new problem present, please set up appointment sooner than planned today.

## 2019-10-09 DIAGNOSIS — N183 Chronic kidney disease, stage 3 unspecified: Secondary | ICD-10-CM | POA: Diagnosis not present

## 2019-10-09 DIAGNOSIS — M8580 Other specified disorders of bone density and structure, unspecified site: Secondary | ICD-10-CM | POA: Diagnosis not present

## 2019-10-09 DIAGNOSIS — Z8673 Personal history of transient ischemic attack (TIA), and cerebral infarction without residual deficits: Secondary | ICD-10-CM | POA: Diagnosis not present

## 2019-10-09 DIAGNOSIS — I5030 Unspecified diastolic (congestive) heart failure: Secondary | ICD-10-CM | POA: Diagnosis not present

## 2019-10-09 DIAGNOSIS — L97412 Non-pressure chronic ulcer of right heel and midfoot with fat layer exposed: Secondary | ICD-10-CM | POA: Diagnosis not present

## 2019-10-09 DIAGNOSIS — Z950 Presence of cardiac pacemaker: Secondary | ICD-10-CM | POA: Diagnosis not present

## 2019-10-09 DIAGNOSIS — E039 Hypothyroidism, unspecified: Secondary | ICD-10-CM | POA: Diagnosis not present

## 2019-10-09 DIAGNOSIS — E785 Hyperlipidemia, unspecified: Secondary | ICD-10-CM | POA: Diagnosis not present

## 2019-10-09 DIAGNOSIS — I2510 Atherosclerotic heart disease of native coronary artery without angina pectoris: Secondary | ICD-10-CM | POA: Diagnosis not present

## 2019-10-09 DIAGNOSIS — U071 COVID-19: Secondary | ICD-10-CM | POA: Diagnosis not present

## 2019-10-09 DIAGNOSIS — F419 Anxiety disorder, unspecified: Secondary | ICD-10-CM | POA: Diagnosis not present

## 2019-10-09 DIAGNOSIS — I951 Orthostatic hypotension: Secondary | ICD-10-CM | POA: Diagnosis not present

## 2019-10-09 DIAGNOSIS — Z853 Personal history of malignant neoplasm of breast: Secondary | ICD-10-CM | POA: Diagnosis not present

## 2019-10-09 DIAGNOSIS — I442 Atrioventricular block, complete: Secondary | ICD-10-CM | POA: Diagnosis not present

## 2019-10-09 DIAGNOSIS — I872 Venous insufficiency (chronic) (peripheral): Secondary | ICD-10-CM | POA: Diagnosis not present

## 2019-10-11 DIAGNOSIS — E039 Hypothyroidism, unspecified: Secondary | ICD-10-CM | POA: Diagnosis not present

## 2019-10-11 DIAGNOSIS — I442 Atrioventricular block, complete: Secondary | ICD-10-CM | POA: Diagnosis not present

## 2019-10-11 DIAGNOSIS — E785 Hyperlipidemia, unspecified: Secondary | ICD-10-CM | POA: Diagnosis not present

## 2019-10-11 DIAGNOSIS — M8580 Other specified disorders of bone density and structure, unspecified site: Secondary | ICD-10-CM | POA: Diagnosis not present

## 2019-10-11 DIAGNOSIS — L97412 Non-pressure chronic ulcer of right heel and midfoot with fat layer exposed: Secondary | ICD-10-CM | POA: Diagnosis not present

## 2019-10-11 DIAGNOSIS — I872 Venous insufficiency (chronic) (peripheral): Secondary | ICD-10-CM | POA: Diagnosis not present

## 2019-10-14 DIAGNOSIS — I442 Atrioventricular block, complete: Secondary | ICD-10-CM | POA: Diagnosis not present

## 2019-10-14 DIAGNOSIS — E785 Hyperlipidemia, unspecified: Secondary | ICD-10-CM | POA: Diagnosis not present

## 2019-10-14 DIAGNOSIS — L97412 Non-pressure chronic ulcer of right heel and midfoot with fat layer exposed: Secondary | ICD-10-CM | POA: Diagnosis not present

## 2019-10-14 DIAGNOSIS — M8580 Other specified disorders of bone density and structure, unspecified site: Secondary | ICD-10-CM | POA: Diagnosis not present

## 2019-10-14 DIAGNOSIS — I872 Venous insufficiency (chronic) (peripheral): Secondary | ICD-10-CM | POA: Diagnosis not present

## 2019-10-14 DIAGNOSIS — E039 Hypothyroidism, unspecified: Secondary | ICD-10-CM | POA: Diagnosis not present

## 2019-10-15 DIAGNOSIS — L97412 Non-pressure chronic ulcer of right heel and midfoot with fat layer exposed: Secondary | ICD-10-CM | POA: Diagnosis not present

## 2019-10-15 DIAGNOSIS — I442 Atrioventricular block, complete: Secondary | ICD-10-CM | POA: Diagnosis not present

## 2019-10-15 DIAGNOSIS — E039 Hypothyroidism, unspecified: Secondary | ICD-10-CM | POA: Diagnosis not present

## 2019-10-15 DIAGNOSIS — I872 Venous insufficiency (chronic) (peripheral): Secondary | ICD-10-CM | POA: Diagnosis not present

## 2019-10-15 DIAGNOSIS — E785 Hyperlipidemia, unspecified: Secondary | ICD-10-CM | POA: Diagnosis not present

## 2019-10-15 DIAGNOSIS — M8580 Other specified disorders of bone density and structure, unspecified site: Secondary | ICD-10-CM | POA: Diagnosis not present

## 2019-10-18 DIAGNOSIS — E785 Hyperlipidemia, unspecified: Secondary | ICD-10-CM | POA: Diagnosis not present

## 2019-10-18 DIAGNOSIS — L97412 Non-pressure chronic ulcer of right heel and midfoot with fat layer exposed: Secondary | ICD-10-CM | POA: Diagnosis not present

## 2019-10-18 DIAGNOSIS — M8580 Other specified disorders of bone density and structure, unspecified site: Secondary | ICD-10-CM | POA: Diagnosis not present

## 2019-10-18 DIAGNOSIS — I872 Venous insufficiency (chronic) (peripheral): Secondary | ICD-10-CM | POA: Diagnosis not present

## 2019-10-18 DIAGNOSIS — I442 Atrioventricular block, complete: Secondary | ICD-10-CM | POA: Diagnosis not present

## 2019-10-18 DIAGNOSIS — E039 Hypothyroidism, unspecified: Secondary | ICD-10-CM | POA: Diagnosis not present

## 2019-10-19 ENCOUNTER — Other Ambulatory Visit: Payer: Self-pay | Admitting: *Deleted

## 2019-10-19 DIAGNOSIS — E876 Hypokalemia: Secondary | ICD-10-CM

## 2019-10-19 MED ORDER — POTASSIUM CHLORIDE ER 20 MEQ PO TBCR: 20.0000 meq | EXTENDED_RELEASE_TABLET | Freq: Every day | ORAL | 3 refills | Status: DC

## 2019-10-21 ENCOUNTER — Inpatient Hospital Stay: Payer: Medicare Other | Admitting: Adult Health

## 2019-10-21 DIAGNOSIS — M8580 Other specified disorders of bone density and structure, unspecified site: Secondary | ICD-10-CM | POA: Diagnosis not present

## 2019-10-21 DIAGNOSIS — I442 Atrioventricular block, complete: Secondary | ICD-10-CM | POA: Diagnosis not present

## 2019-10-21 DIAGNOSIS — I872 Venous insufficiency (chronic) (peripheral): Secondary | ICD-10-CM | POA: Diagnosis not present

## 2019-10-21 DIAGNOSIS — E785 Hyperlipidemia, unspecified: Secondary | ICD-10-CM | POA: Diagnosis not present

## 2019-10-21 DIAGNOSIS — E039 Hypothyroidism, unspecified: Secondary | ICD-10-CM | POA: Diagnosis not present

## 2019-10-21 DIAGNOSIS — L97412 Non-pressure chronic ulcer of right heel and midfoot with fat layer exposed: Secondary | ICD-10-CM | POA: Diagnosis not present

## 2019-10-22 ENCOUNTER — Telehealth: Payer: Self-pay | Admitting: Podiatry

## 2019-10-22 NOTE — Telephone Encounter (Signed)
Crystal from [place] [facility] called and wanted to f/u on oreders for 10/02/19 aand 10/06/19  Please assist Crystal contact # [phone]

## 2019-10-25 ENCOUNTER — Ambulatory Visit (INDEPENDENT_AMBULATORY_CARE_PROVIDER_SITE_OTHER): Payer: Medicare Other | Admitting: Adult Health

## 2019-10-25 ENCOUNTER — Encounter: Payer: Self-pay | Admitting: Adult Health

## 2019-10-25 ENCOUNTER — Ambulatory Visit (INDEPENDENT_AMBULATORY_CARE_PROVIDER_SITE_OTHER): Payer: Medicare Other | Admitting: *Deleted

## 2019-10-25 VITALS — BP 136/89 | HR 75 | Ht 67.0 in | Wt 209.0 lb

## 2019-10-25 DIAGNOSIS — I442 Atrioventricular block, complete: Secondary | ICD-10-CM

## 2019-10-25 DIAGNOSIS — E039 Hypothyroidism, unspecified: Secondary | ICD-10-CM | POA: Diagnosis not present

## 2019-10-25 DIAGNOSIS — E785 Hyperlipidemia, unspecified: Secondary | ICD-10-CM

## 2019-10-25 DIAGNOSIS — I639 Cerebral infarction, unspecified: Secondary | ICD-10-CM | POA: Diagnosis not present

## 2019-10-25 DIAGNOSIS — L97412 Non-pressure chronic ulcer of right heel and midfoot with fat layer exposed: Secondary | ICD-10-CM | POA: Diagnosis not present

## 2019-10-25 DIAGNOSIS — I872 Venous insufficiency (chronic) (peripheral): Secondary | ICD-10-CM | POA: Diagnosis not present

## 2019-10-25 DIAGNOSIS — M8580 Other specified disorders of bone density and structure, unspecified site: Secondary | ICD-10-CM | POA: Diagnosis not present

## 2019-10-25 MED ORDER — ROSUVASTATIN CALCIUM 5 MG PO TABS
5.0000 mg | ORAL_TABLET | Freq: Every day | ORAL | 3 refills | Status: DC
Start: 2019-10-25 — End: 2019-11-03

## 2019-10-25 NOTE — Patient Instructions (Signed)
Continue aspirin 81 mg daily for secondary stroke prevention  Recommend stopping atorvastatin (lipitor) due to stomach concerns  Start Crestor 5mg  daily for cholesterol management   Continue to follow up with PCP regarding cholesterol and blood pressure management   Continue to monitor blood pressure at home  Maintain strict control of hypertension with blood pressure goal below 130/90, diabetes with hemoglobin A1c goal below 6.5% and cholesterol with LDL cholesterol (bad cholesterol) goal below 70 mg/dL. I also advised the patient to eat a healthy diet with plenty of whole grains, cereals, fruits and vegetables, exercise regularly and maintain ideal body weight.  Followup in the future with me in 3 months or call earlier if needed       Thank you for coming to see [place] at [facility]. I hope we have been able to provide you high quality care today.  You may receive a patient satisfaction survey over the next few weeks. We would appreciate your feedback and comments so that we may continue to improve ourselves and the health of our patients.

## 2019-10-25 NOTE — Progress Notes (Signed)
[facility] [address] [place]. [place] [address] [phone]       [place] [name] Date of Birth:  November 06, 1927 Medical Record Number:  [id number]   Reason for Referral:  hospital stroke follow up    SUBJECTIVE:   CHIEF COMPLAINT:  Chief Complaint  Patient presents with   Follow-up    hospital fu, rm 9, pt reports dizziness    HPI:   Nichole L Sherrillis a 86 y.o.femalewith history of thyroid disease, orthostatic hypotension, hypercholesterolemia, delusional disorder, complete heart block status post pacer, chronic congestive heart failurewho presented on 09/21/2019 with confusion with L arm and L leg weakness.  Stroke work-up revealed aborted right MCA infarct s/p tPA and IR w/ TICI revascularization of right MCA M2 occlusion, infarct embolic secondary to unknown source.  Recommended 30-day cardiac event monitor outpatient to assess for atrial fibrillation as possible etiology.  Recommended DAPT for 3 weeks and aspirin alone.  Covid positive likely incidental finding as asymptomatic and received first dose of vaccination 3 weeks prior.  History of orthostatic hypotension and recommended continuation of midodrine.  LDL 89 and recommended atorvastatin 10 mg daily.  Other stroke risk factors include advanced age, obesity, history of incidental cerebral vermis hemorrhage vs cavernoma in 09/2018 (no MRI d/t pacer), history of CHF and heart block with pacemaker.  Other active problems include history of bilateral breast cancer, thyroid disease and delusional disorder.  Evaluated by therapies and discharged home with recommendation of home health PT/OT.  Stroke:Aborted R MCAinfarcts/p tPA and IR w/ TICI3 revascularization,embolic secondary tounknownsource  Code Stroke CT headhyperdense R M2. No hemorrhage.ASPECTS 10.   CTA head & neckR MCA M2 occlusion. B ICQ siphons dolichoectatic. B fetal PCAs. Mild proximal ICA  atherosclerosis.  Cerebral angio proximal R M2 occlusion s/p mechanical thrombectomy w/ embotrap and aspiration. TICI3 revascularization   Post IR CTUnremarkable  CT no acute ICH, no stroke  2D EchoEF55-60%. No source of embolus  LDL89  HgbA1c6.1  No antithromboticprior to admission, add aspirin and plavix x 3 weeks then aspirin alone   Therapy recommendations:HH OT, [place] home   Today, 10/25/2019, [name] is being seen for hospital follow-up.  She has been doing well since discharge with residual gait impairment but does report ongoing improvement. She does report intermittent dizziness with position changes but she believes it is due to furosemide (history of orthostatic hypotension). [facility]. Continues to ambulate with a cane and denies recent falls. Completed 3 weeks DAPT and continues on aspirin 81mg  daily alone without bleeding or bruising.  Continues on atorvastatin 10 mg daily without myalgias but is concerned regarding GI side effects.  Blood pressure today 136/89. She has completed cardiac monitor this morning and plans on sending back in.  No concerns at this time.       ROS:   14 system review of systems performed and negative with exception of gait impairment  PMH:  Past Medical History:  Diagnosis Date   Cataract    Chronic congestive heart failure, unspecified heart failure type ([place]) 09/19/2019   Complete heart block (HCC)    s/p PPM implant (MDT) by Dr Blanch Media.  Atrial lead could not be paced at time of the procedure.  She has chronic AV dysociation   Delusional disorder ([place])    Exogenous obesity    Hypercholesterolemia    Invasive ductal carcinoma of breast ([place]) 03/2007  BILATERAL BREASTS   Orthostatic hypotension    treated with midodrine by Dr Rollene [name]   PONV (postoperative nausea and vomiting)    Thyroid disease    Venous insufficiency      PSH:  Past Surgical History:  Procedure Laterality Date   ABDOMINAL HYSTERECTOMY     BREAST LUMPECTOMY Bilateral 2009   CATARACT EXTRACTION     EYE SURGERY X 2   CHOLECYSTECTOMY     COLONOSCOPY N/A 02/16/2015   Procedure: COLONOSCOPY;  Surgeon: Wilford Corner, MD;  Location: [facility] ENDOSCOPY;  Service: Endoscopy;  Laterality: N/A;   EP IMPLANTABLE DEVICE N/A 02/23/2015   pacemaker generator change (MDT Sensia SR) by Dr Rayann Heman    ESOPHAGOGASTRODUODENOSCOPY N/A 02/16/2015   Procedure: ESOPHAGOGASTRODUODENOSCOPY (EGD);  Surgeon: Wilford Corner, MD;  Location: [facility] ENDOSCOPY;  Service: Endoscopy;  Laterality: N/A;   IR CT HEAD LTD  09/21/2019   IR PERCUTANEOUS ART THROMBECTOMY/INFUSION INTRACRANIAL INC DIAG ANGIO  09/21/2019   PACEMAKER INSERTION     MDT implanted by Dr Blanch Media.  Atrial lead placement was unsuccessful.  She has chronic AV dysociation   RADIOLOGY WITH ANESTHESIA N/A 09/21/2019   Procedure: IR WITH ANESTHESIA CODE STROKE;  Surgeon: Radiologist, Medication, MD;  Location: [facility];  Service: Radiology;  Laterality: N/A;   remote right radical nephrectomy  2009    Social History:  Social History   Socioeconomic History   Marital status: Widowed    Spouse name: Not on file   Number of children: Not on file   Years of education: Not on file   Highest education level: Not on file  Occupational History   Not on file  Tobacco Use   Smoking status: Never Smoker   Smokeless tobacco: Never Used  Vaping Use   Vaping Use: Never used  Substance and Sexual Activity   Alcohol use: No    Alcohol/week: 0.0 standard drinks   Drug use: No   Sexual activity: Not Currently  Other Topics Concern   Not on file  Social History Narrative   Not on file   Social Determinants of Health   Financial Resource Strain:    Difficulty of Paying Living Expenses:   Food Insecurity:    Worried About Charity fundraiser in the Last Year:    Arboriculturist in the  Last Year:   Transportation Needs:    Film/video editor (Medical):    Lack of Transportation (Non-Medical):   Physical Activity:    Days of Exercise per Week:    Minutes of Exercise per Session:   Stress:    Feeling of Stress :   Social Connections:    Frequency of Communication with Friends and Family:    Frequency of Social Gatherings with Friends and Family:    Attends Religious Services:    Active Member of Clubs or Organizations:    Attends Music therapist:    Marital Status:   Intimate Partner Violence:    Fear of Current or Ex-Partner:    Emotionally Abused:    Physically Abused:    Sexually Abused:     Family History:  Family History  Problem Relation Age of Onset   Heart disease Maternal Grandmother    Heart disease Mother     Medications:   Current Outpatient Medications on File Prior to Visit  Medication Sig Dispense Refill   aspirin EC 81 MG EC tablet Take 1 tablet (81 mg total) by mouth daily.     atorvastatin (LIPITOR)  10 MG tablet Take 1 tablet (10 mg total) by mouth daily. 30 tablet 2   Calcium Carbonate-Vitamin D (CALTRATE 600+D PO) Take 1 tablet by mouth daily after breakfast.      fesoterodine (TOVIAZ) 4 MG TB24 tablet Take 1 tablet (4 mg total) by mouth daily. 30 tablet 2   furosemide (LASIX) 20 MG tablet Take 0.5 tablets (10 mg total) by mouth daily. 45 tablet 2   Lactobacillus Rhamnosus, GG, (CULTURELLE PO) Take 1 capsule by mouth every morning.     latanoprost (XALATAN) 0.005 % ophthalmic solution Place 1 drop into both eyes at bedtime.   10   levothyroxine (SYNTHROID) 50 MCG tablet Take 1 tablet (50 mcg total) by mouth daily before breakfast. 90 tablet 2   midodrine (PROAMATINE) 2.5 MG tablet Take 1 tablet (2.5 mg total) by mouth daily after breakfast. 90 tablet 2   Potassium Chloride ER 20 MEQ TBCR Take 20 mEq by mouth daily. 90 tablet 3   No current facility-administered medications on file prior to  visit.    Allergies:   Allergies  Allergen Reactions   Amoxicillin-Pot Clavulanate Diarrhea and Nausea And Vomiting    Has patient had a PCN reaction causing immediate rash, facial/tongue/throat swelling, SOB or lightheadedness with hypotension: Yes Has patient had a PCN reaction causing severe rash involving mucus membranes or skin necrosis: No Has patient had a PCN reaction that required hospitalization: No Has patient had a PCN reaction occurring within the last 10 years: Yes If all of the above answers are "NO", then may proceed with Cephalosporin use.    Erythromycin Hives   Tape Other (See Comments)    BAND AIDS-SKIN IRRITATION   Codeine Nausea And Vomiting   Doxycycline Diarrhea   Flagyl [Metronidazole] Hives   Macrobid [Nitrofurantoin Macrocrystal] Nausea And Vomiting   Tolterodine Nausea And Vomiting   Ciprofloxacin Hives and Rash      OBJECTIVE:  Physical Exam  Vitals:   10/25/19 1325  BP: 136/89  Pulse: 75  Weight: 209 lb (94.8 kg)  Height: 5\' 7"  (1.702 m)   Body mass index is 32.73 kg/m. No exam data present  Depression screen [facility] 2/9 04/20/2019  Decreased Interest 0  Down, Depressed, Hopeless 0  PHQ - 2 Score 0  Some recent data might be hidden     General: well developed, well nourished,  very pleasant elderly Caucasian female, seated, in no evident distress Head: head normocephalic and atraumatic.   Neck: supple with no carotid or supraclavicular bruits Cardiovascular: regular rate and rhythm, no murmurs Musculoskeletal: no deformity Skin:  no rash/petichiae Vascular:  Normal pulses all extremities   Neurologic Exam Mental Status: Awake and fully alert.   Fluent speech and language.  Oriented to place and time. Recent and remote memory intact. Attention span, concentration and fund of knowledge appropriate during visit. Mood and affect appropriate.  Cranial Nerves: Fundoscopic exam reveals sharp disc margins. Pupils equal, briskly  reactive to light. Extraocular movements full without nystagmus. Visual fields full to confrontation. Hearing intact. Facial sensation intact. Face, tongue, palate moves normally and symmetrically.  Motor: Normal bulk and tone. Normal strength in all tested extremity muscles. Sensory.: intact to touch , pinprick , position and vibratory sensation.  Coordination: Rapid alternating movements normal in all extremities. Finger-to-nose and heel-to-shin performed accurately bilaterally. Gait and Station: Arises from chair without difficulty. Stance is normal. Gait demonstrates  short shuffled steps with use of cane Reflexes: 1+ and symmetric. Toes downgoing.  NIHSS  0 Modified Rankin  2      ASSESSMENT: [name] is a 86 y.o. year old female presented with confusion and left hemiparesis on 09/21/2019 with stroke work-up aborted right MCA infarct s/p TPA and IR with TICI 3 revascularization of right M2 occlusion, infarct embolic secondary to unknown source. Vascular risk factors include HLD, advanced age, prior stroke history, CHF and heart block with pacer.  Residual gait impairment     PLAN:  1. Right MCA stroke: Continue aspirin 81 mg daily  and Crestor 5 mg daily for secondary stroke prevention.  Completed cardiac monitor this morning and will be returning to cardiology for further review.  Maintain strict control of hypertension with blood pressure goal below 130/90, diabetes with hemoglobin A1c goal below 6.5% and cholesterol with LDL cholesterol (bad cholesterol) goal below 70 mg/dL.  I also advised the patient to eat a healthy diet with plenty of whole grains, cereals, fruits and vegetables, exercise regularly with at least 30 minutes of continuous activity daily and maintain ideal body weight. 2. HLD: Possible statin side effect therefore recommend discontinuing atorvastatin and initiate Crestor 5 mg daily.  Continue to follow closely with PCP for ongoing monitoring and  management     Follow up in 3 months or call earlier if needed   I spent 45 minutes of face-to-face and non-face-to-face time with patient.  This included previsit chart review, lab review, study review, order entry, electronic health record documentation, patient education regarding recent stroke, residual deficits, importance of managing stroke risk factors and answered all questions to patient satisfaction     Frann Rider, [facility]  [facility] [address] [place], [place] [address]  Phone [phone] Fax [phone] Note: This document was prepared with digital dictation and possible smart phrase technology. Any transcriptional errors that result from this process are unintentional.

## 2019-10-26 LAB — CUP PACEART REMOTE DEVICE CHECK
Battery Impedance: 1880 Ohm
Battery Remaining Longevity: 38 mo
Battery Voltage: 2.75 V
Brady Statistic RV Percent Paced: 99 %
Date Time Interrogation Session: 20210628064305
Implantable Lead Implant Date: 20141208
Implantable Lead Location: 753860
Implantable Lead Model: 4076
Implantable Pulse Generator Implant Date: 20161027
Lead Channel Impedance Value: 0 Ohm
Lead Channel Impedance Value: 397 Ohm
Lead Channel Pacing Threshold Amplitude: 1.125 V
Lead Channel Pacing Threshold Pulse Width: 0.4 ms
Lead Channel Setting Pacing Amplitude: 2.25 V
Lead Channel Setting Pacing Pulse Width: 0.4 ms
Lead Channel Setting Sensing Sensitivity: 2 mV

## 2019-10-27 ENCOUNTER — Telehealth: Payer: Self-pay | Admitting: Podiatry

## 2019-10-27 ENCOUNTER — Other Ambulatory Visit: Payer: Self-pay

## 2019-10-27 ENCOUNTER — Encounter: Payer: Self-pay | Admitting: Adult Health

## 2019-10-27 ENCOUNTER — Ambulatory Visit (INDEPENDENT_AMBULATORY_CARE_PROVIDER_SITE_OTHER): Payer: Medicare Other | Admitting: Podiatry

## 2019-10-27 VITALS — Temp 97.0°F

## 2019-10-27 DIAGNOSIS — I872 Venous insufficiency (chronic) (peripheral): Secondary | ICD-10-CM

## 2019-10-27 DIAGNOSIS — L97512 Non-pressure chronic ulcer of other part of right foot with fat layer exposed: Secondary | ICD-10-CM | POA: Diagnosis not present

## 2019-10-27 NOTE — Progress Notes (Signed)
.     Subjective:  86 y.o. female presenting today for follow up evaluation of an ulceration of the right foot.  Patient states she is doing much better.  She has been having the nurses come out to her house 2 times per week for dressing changes.  No new complaints at this time.  Past Medical History:  Diagnosis Date  . Cataract   . Chronic congestive heart failure, unspecified heart failure type ([place]) 09/19/2019  . Complete heart block (HCC)    s/p PPM implant (MDT) by Dr Blanch Media.  Atrial lead could not be paced at time of the procedure.  She has chronic AV dysociation  . Delusional disorder ([place])   . Exogenous obesity   . Hypercholesterolemia   . Invasive ductal carcinoma of breast ([place]) 03/2007   BILATERAL BREASTS  . Orthostatic hypotension    treated with midodrine by Dr Rollene [name]  . PONV (postoperative nausea and vomiting)   . Thyroid disease   . Venous insufficiency      Objective/Physical Exam General: The patient is alert and oriented x3 in no acute distress.  Dermatology:  Wound #1 noted to the right foot measuring 1.0 x 0.8 x 0.2 cm (LxWxD).   To the noted ulceration(s), there is no eschar. There is a moderate amount of slough, fibrin, and necrotic tissue noted. Granulation tissue and wound base is red. There is a minimal amount of serosanguineous drainage noted. There is no exposed bone muscle-tendon ligament or joint. There is no malodor. Periwound integrity is intact. Erythema and edema noted to the right lower extremity.   Vascular: Palpable pedal pulses bilaterally. Capillary refill within normal limits. Varicosities noted bilateral lower extremities.   Neurological: Epicritic and protective threshold diminished bilaterally.   Musculoskeletal Exam: Advanced degenerative changes noted to the bilateral feet with arch collapse and progressive DJD given the patient's age.  Assessment: #1 ulceration of the right foot secondary to venous insufficiency #2 varicosities  bilateral lower extremities   Plan of Care:  #1 Patient was evaluated.  #2 medically necessary excisional debridement including subcutaneous tissue was performed using a tissue nipper and a chisel blade. Excisional debridement of all the necrotic nonviable tissue down to healthy bleeding viable tissue was performed with post-debridement measurements same as pre-. #3 the wound was cleansed with normal saline. #4 Dry sterile dressing applied.   #5 Continue home health dressings at home 2 times per week.  #6 Return to clinic in 6 weeks for follow-up and routine nail trim.      Edrick Kins, DPM [facility]  Dr. Edrick Kins, [place]                                        [place], [place] [address]                Office [phone]  Fax [phone]

## 2019-10-27 NOTE — Progress Notes (Signed)
Remote pacemaker transmission.   

## 2019-10-27 NOTE — Progress Notes (Signed)
I agree with the above plan 

## 2019-10-27 NOTE — Telephone Encounter (Signed)
Crystal from home health called to get staus of orders that were faxed 10/05/19 order  #[id number] , & 10/07/19 order # [id number] please assist

## 2019-10-28 DIAGNOSIS — I442 Atrioventricular block, complete: Secondary | ICD-10-CM | POA: Diagnosis not present

## 2019-10-28 DIAGNOSIS — L97412 Non-pressure chronic ulcer of right heel and midfoot with fat layer exposed: Secondary | ICD-10-CM | POA: Diagnosis not present

## 2019-10-28 DIAGNOSIS — E039 Hypothyroidism, unspecified: Secondary | ICD-10-CM | POA: Diagnosis not present

## 2019-10-28 DIAGNOSIS — I872 Venous insufficiency (chronic) (peripheral): Secondary | ICD-10-CM | POA: Diagnosis not present

## 2019-10-28 DIAGNOSIS — M8580 Other specified disorders of bone density and structure, unspecified site: Secondary | ICD-10-CM | POA: Diagnosis not present

## 2019-10-28 DIAGNOSIS — E785 Hyperlipidemia, unspecified: Secondary | ICD-10-CM | POA: Diagnosis not present

## 2019-10-29 DIAGNOSIS — E039 Hypothyroidism, unspecified: Secondary | ICD-10-CM | POA: Diagnosis not present

## 2019-10-29 DIAGNOSIS — L97412 Non-pressure chronic ulcer of right heel and midfoot with fat layer exposed: Secondary | ICD-10-CM | POA: Diagnosis not present

## 2019-10-29 DIAGNOSIS — M8580 Other specified disorders of bone density and structure, unspecified site: Secondary | ICD-10-CM | POA: Diagnosis not present

## 2019-10-29 DIAGNOSIS — I442 Atrioventricular block, complete: Secondary | ICD-10-CM | POA: Diagnosis not present

## 2019-10-29 DIAGNOSIS — E785 Hyperlipidemia, unspecified: Secondary | ICD-10-CM | POA: Diagnosis not present

## 2019-10-29 DIAGNOSIS — I872 Venous insufficiency (chronic) (peripheral): Secondary | ICD-10-CM | POA: Diagnosis not present

## 2019-11-02 DIAGNOSIS — I442 Atrioventricular block, complete: Secondary | ICD-10-CM | POA: Diagnosis not present

## 2019-11-02 DIAGNOSIS — E785 Hyperlipidemia, unspecified: Secondary | ICD-10-CM | POA: Diagnosis not present

## 2019-11-02 DIAGNOSIS — L97412 Non-pressure chronic ulcer of right heel and midfoot with fat layer exposed: Secondary | ICD-10-CM | POA: Diagnosis not present

## 2019-11-02 DIAGNOSIS — I872 Venous insufficiency (chronic) (peripheral): Secondary | ICD-10-CM | POA: Diagnosis not present

## 2019-11-02 DIAGNOSIS — E039 Hypothyroidism, unspecified: Secondary | ICD-10-CM | POA: Diagnosis not present

## 2019-11-02 DIAGNOSIS — M8580 Other specified disorders of bone density and structure, unspecified site: Secondary | ICD-10-CM | POA: Diagnosis not present

## 2019-11-03 ENCOUNTER — Other Ambulatory Visit: Payer: Self-pay | Admitting: *Deleted

## 2019-11-03 ENCOUNTER — Telehealth: Payer: Self-pay | Admitting: Family Medicine

## 2019-11-03 DIAGNOSIS — I442 Atrioventricular block, complete: Secondary | ICD-10-CM | POA: Diagnosis not present

## 2019-11-03 DIAGNOSIS — M8580 Other specified disorders of bone density and structure, unspecified site: Secondary | ICD-10-CM | POA: Diagnosis not present

## 2019-11-03 DIAGNOSIS — E785 Hyperlipidemia, unspecified: Secondary | ICD-10-CM | POA: Diagnosis not present

## 2019-11-03 DIAGNOSIS — I872 Venous insufficiency (chronic) (peripheral): Secondary | ICD-10-CM | POA: Diagnosis not present

## 2019-11-03 DIAGNOSIS — E039 Hypothyroidism, unspecified: Secondary | ICD-10-CM | POA: Diagnosis not present

## 2019-11-03 DIAGNOSIS — L97412 Non-pressure chronic ulcer of right heel and midfoot with fat layer exposed: Secondary | ICD-10-CM | POA: Diagnosis not present

## 2019-11-03 MED ORDER — ROSUVASTATIN CALCIUM 5 MG PO TABS: 5.0000 mg | ORAL_TABLET | Freq: Every day | ORAL | 3 refills | Status: DC

## 2019-11-03 NOTE — Telephone Encounter (Signed)
Need to speak with CMA about medication she is taking and having Problem with Asprin  [phone]

## 2019-11-03 NOTE — Telephone Encounter (Signed)
Aspirin was recommended for prevention of another CV event. [She is not longer on Plavix.]  Is she taking Aspirin 325 mg or 81 mg? If she is taking Aspirin 325 mg , she can try lower dose: 81 mg. If she is taking Aspirin 81 mg and she is having rectal bleed, she may need to stop it. If rectal bleed is mild and it continues she will need to be evaluated here in the office. If red bright rectal blood,she needs to go to the ER. Thanks, BJ

## 2019-11-03 NOTE — Telephone Encounter (Signed)
I spoke with patient. She is having problems with the Aspirin. It is causing her to feel really drowsy in the morning & have some rectal bleeding. She said this has happened in the past and they had to take her off of it. She is wanting to know if something different can be sent in for her to take to replace the aspirin.

## 2019-11-04 ENCOUNTER — Other Ambulatory Visit: Payer: Self-pay | Admitting: Family Medicine

## 2019-11-04 DIAGNOSIS — I442 Atrioventricular block, complete: Secondary | ICD-10-CM | POA: Diagnosis not present

## 2019-11-04 DIAGNOSIS — M8580 Other specified disorders of bone density and structure, unspecified site: Secondary | ICD-10-CM | POA: Diagnosis not present

## 2019-11-04 DIAGNOSIS — E039 Hypothyroidism, unspecified: Secondary | ICD-10-CM

## 2019-11-04 DIAGNOSIS — I872 Venous insufficiency (chronic) (peripheral): Secondary | ICD-10-CM | POA: Diagnosis not present

## 2019-11-04 DIAGNOSIS — L97412 Non-pressure chronic ulcer of right heel and midfoot with fat layer exposed: Secondary | ICD-10-CM | POA: Diagnosis not present

## 2019-11-04 DIAGNOSIS — E785 Hyperlipidemia, unspecified: Secondary | ICD-10-CM | POA: Diagnosis not present

## 2019-11-04 NOTE — Telephone Encounter (Signed)
The Lasix is only available in 20, 40, and 80 in tablet form. They do have a 10 mg in the liquid form.

## 2019-11-04 NOTE — Telephone Encounter (Signed)
Pt is calling in stating that she received a call from her pharmacy stating that she needs to call the office to see if they can release her medications so that they can send them out.  Rx's needed is furosemide (LASIX) 20 MG (pt would like to see if this can be ordered as 10 MG b/c she is having a hard time cutting them in half) and levothyroxine (SYNTHROID) 50 MCG   Pharm:  [facility] mail order.

## 2019-11-05 ENCOUNTER — Encounter (INDEPENDENT_AMBULATORY_CARE_PROVIDER_SITE_OTHER): Payer: Medicare Other

## 2019-11-05 ENCOUNTER — Encounter: Payer: Self-pay | Admitting: Internal Medicine

## 2019-11-05 ENCOUNTER — Telehealth: Payer: Self-pay | Admitting: Internal Medicine

## 2019-11-05 DIAGNOSIS — I442 Atrioventricular block, complete: Secondary | ICD-10-CM | POA: Diagnosis not present

## 2019-11-05 DIAGNOSIS — I639 Cerebral infarction, unspecified: Secondary | ICD-10-CM

## 2019-11-05 DIAGNOSIS — I4891 Unspecified atrial fibrillation: Secondary | ICD-10-CM | POA: Diagnosis not present

## 2019-11-05 DIAGNOSIS — I872 Venous insufficiency (chronic) (peripheral): Secondary | ICD-10-CM | POA: Diagnosis not present

## 2019-11-05 DIAGNOSIS — E785 Hyperlipidemia, unspecified: Secondary | ICD-10-CM | POA: Diagnosis not present

## 2019-11-05 DIAGNOSIS — E039 Hypothyroidism, unspecified: Secondary | ICD-10-CM | POA: Diagnosis not present

## 2019-11-05 DIAGNOSIS — M8580 Other specified disorders of bone density and structure, unspecified site: Secondary | ICD-10-CM | POA: Diagnosis not present

## 2019-11-05 DIAGNOSIS — L97412 Non-pressure chronic ulcer of right heel and midfoot with fat layer exposed: Secondary | ICD-10-CM | POA: Diagnosis not present

## 2019-11-05 MED ORDER — LEVOTHYROXINE SODIUM 50 MCG PO TABS: 50.0000 ug | ORAL_TABLET | Freq: Every day | ORAL | 2 refills | Status: DC

## 2019-11-05 MED ORDER — FUROSEMIDE 20 MG PO TABS: 10.0000 mg | ORAL_TABLET | Freq: Every day | ORAL | 2 refills | Status: DC

## 2019-11-05 NOTE — Telephone Encounter (Signed)
Tim from [employer] with EKG results.

## 2019-11-05 NOTE — Addendum Note (Signed)
Addended by: Rodrigo Ran on: 11/05/2019 02:32 PM   Modules accepted: Orders

## 2019-11-05 NOTE — Telephone Encounter (Signed)
Spoke with Tim from [employer] who states that critical event was received that showed the patient to be in atrial flutter with rate of 60 bpm. Report has been faxed over. They have tried to reach out to the patient, but there was no answer.

## 2019-11-05 NOTE — Telephone Encounter (Signed)
Spoke with pt, she was in the grocery store. Will call back around noon.

## 2019-11-05 NOTE — Telephone Encounter (Signed)
I called and spoke with patient. She will stop taking the Aspirin.

## 2019-11-05 NOTE — Telephone Encounter (Signed)
Spoke with pt, she will continue using the 20 mg tablet & cutting it in half. Refill sent.

## 2019-11-05 NOTE — Telephone Encounter (Signed)
Spoke with the patient who states that someone came by and put the monitor on her about an hour ago. Since then she has just been sitting watching TV. She denies any symptoms.

## 2019-11-08 DIAGNOSIS — I5030 Unspecified diastolic (congestive) heart failure: Secondary | ICD-10-CM | POA: Diagnosis not present

## 2019-11-08 DIAGNOSIS — Z950 Presence of cardiac pacemaker: Secondary | ICD-10-CM | POA: Diagnosis not present

## 2019-11-08 DIAGNOSIS — M8580 Other specified disorders of bone density and structure, unspecified site: Secondary | ICD-10-CM | POA: Diagnosis not present

## 2019-11-08 DIAGNOSIS — I951 Orthostatic hypotension: Secondary | ICD-10-CM | POA: Diagnosis not present

## 2019-11-08 DIAGNOSIS — Z853 Personal history of malignant neoplasm of breast: Secondary | ICD-10-CM | POA: Diagnosis not present

## 2019-11-08 DIAGNOSIS — I872 Venous insufficiency (chronic) (peripheral): Secondary | ICD-10-CM | POA: Diagnosis not present

## 2019-11-08 DIAGNOSIS — E785 Hyperlipidemia, unspecified: Secondary | ICD-10-CM | POA: Diagnosis not present

## 2019-11-08 DIAGNOSIS — I2510 Atherosclerotic heart disease of native coronary artery without angina pectoris: Secondary | ICD-10-CM | POA: Diagnosis not present

## 2019-11-08 DIAGNOSIS — E039 Hypothyroidism, unspecified: Secondary | ICD-10-CM | POA: Diagnosis not present

## 2019-11-08 DIAGNOSIS — Z8673 Personal history of transient ischemic attack (TIA), and cerebral infarction without residual deficits: Secondary | ICD-10-CM | POA: Diagnosis not present

## 2019-11-08 DIAGNOSIS — F419 Anxiety disorder, unspecified: Secondary | ICD-10-CM | POA: Diagnosis not present

## 2019-11-08 DIAGNOSIS — I442 Atrioventricular block, complete: Secondary | ICD-10-CM | POA: Diagnosis not present

## 2019-11-08 DIAGNOSIS — Z8616 Personal history of COVID-19: Secondary | ICD-10-CM | POA: Diagnosis not present

## 2019-11-08 DIAGNOSIS — L97412 Non-pressure chronic ulcer of right heel and midfoot with fat layer exposed: Secondary | ICD-10-CM | POA: Diagnosis not present

## 2019-11-08 NOTE — Progress Notes (Signed)
[name] Date of Birth: 11/24/1927 Medical Record #[id number]  History of Present Illness: [name] is seen today for follow up of complete heart block.  She has a history of complete heart block with 12 second pause and underwent permanent pacemaker implant on 10/09/2007 with a [employer] adapta VVI pacemaker. She had revision of her pacemaker on February 23, 2015. She was admitted at that time with a GI bleed felt to be diverticular. No recurrence.  She also has a history of orthostatic dizziness managed with midodrine. She has no history of coronary disease or congestive heart failure. Last pacemaker check in December 2019 was normal.  She was admitted in June 2018 with acute diverticulitis. Treated with hydration and antibiotics. Admitted in November and December 2018 with hematochezia. Felt to be diverticular bleed. Conservative therapy recommended. ASA discontinued.   She has a history of chronic nonhealing wound on her right leg. She was seen in September in the ED with complaints of asymmetric leg swelling. Doppler negative for DVT.  Of note in June 2020 she was admitted with delusional ideation.  She presented without any focal neurological symptoms or deficits but due to symptoms of delusion hallucinations had a CT scan done which showed a tiny incidental 8 mm midline cerebellar hemorrhage exact etiology unclear but unlikely to be hypertensive.  Possibly cavernous angioma. Repeat follow up CT showed resolution of hemorrhage.   She was recently admitted 5/25-5/27/21 with acute right MCA CVA. She was treated emergently with TPA and mechanical thrombectomy with aborted stroke. Discharged on ASA and plavix for 3 weeks then ASA only. Started on statin. Echo was unremarkable. 30 day event monitor was scheduled. As recorded on 7/9 this showed atrial flutter with variable block rate 60.   Since DC she has done well from a neurological standpoint. Denies any left sided weakness. Quit  taking lipitor because she states it caused her bowels to run. Quit taking ASA. States it made her bleed. Noted rectal bleeding with this. Feels well without chest pain or SOB.     Current Outpatient Medications on File Prior to Visit  Medication Sig Dispense Refill  . Calcium Carbonate-Vitamin D (CALTRATE 600+D PO) Take 1 tablet by mouth daily after breakfast.     . furosemide (LASIX) 20 MG tablet Take 0.5 tablets (10 mg total) by mouth daily. 45 tablet 2  . Lactobacillus Rhamnosus, GG, (CULTURELLE PO) Take 1 capsule by mouth every morning.    . latanoprost (XALATAN) 0.005 % ophthalmic solution Place 1 drop into both eyes at bedtime.   10  . levothyroxine (SYNTHROID) 50 MCG tablet Take 1 tablet (50 mcg total) by mouth daily before breakfast. 90 tablet 2  . midodrine (PROAMATINE) 2.5 MG tablet Take 1 tablet (2.5 mg total) by mouth daily after breakfast. 90 tablet 2  . Potassium Chloride ER 20 MEQ TBCR Take 20 mEq by mouth daily. 90 tablet 3  . rosuvastatin (CRESTOR) 5 MG tablet Take 1 tablet (5 mg total) by mouth daily. 90 tablet 3   No current facility-administered medications on file prior to visit.    Allergies  Allergen Reactions  . Amoxicillin-Pot Clavulanate Diarrhea and Nausea And Vomiting    Has patient had a PCN reaction causing immediate rash, facial/tongue/throat swelling, SOB or lightheadedness with hypotension: Yes Has patient had a PCN reaction causing severe rash involving mucus membranes or skin necrosis: No Has patient had a PCN reaction that required hospitalization: No Has patient had a PCN reaction occurring within the  last 10 years: Yes If all of the above answers are "NO", then may proceed with Cephalosporin use.   . Erythromycin Hives  . Tape Other (See Comments)    BAND AIDS-SKIN IRRITATION  . Codeine Nausea And Vomiting  . Doxycycline Diarrhea  . Flagyl [Metronidazole] Hives  . Macrobid [Nitrofurantoin Macrocrystal] Nausea And Vomiting  . Tolterodine Nausea  And Vomiting  . Ciprofloxacin Hives and Rash    Past Medical History:  Diagnosis Date  . Cataract   . Chronic congestive heart failure, unspecified heart failure type ([place]) 09/19/2019  . Complete heart block (HCC)    s/p PPM implant (MDT) by Dr Blanch Media.  Atrial lead could not be paced at time of the procedure.  She has chronic AV dysociation  . Delusional disorder ([place])   . Exogenous obesity   . Hypercholesterolemia   . Invasive ductal carcinoma of breast ([place]) 03/2007   BILATERAL BREASTS  . Orthostatic hypotension    treated with midodrine by Dr Rollene [name]  . PONV (postoperative nausea and vomiting)   . Thyroid disease   . Venous insufficiency     Past Surgical History:  Procedure Laterality Date  . ABDOMINAL HYSTERECTOMY    . BREAST LUMPECTOMY Bilateral 2009  . CATARACT EXTRACTION     EYE SURGERY X 2  . CHOLECYSTECTOMY    . COLONOSCOPY N/A 02/16/2015   Procedure: COLONOSCOPY;  Surgeon: Wilford Corner, MD;  [facility];  Service: Endoscopy;  Laterality: N/A;  . EP IMPLANTABLE DEVICE N/A 02/23/2015   pacemaker generator change (MDT Sensia SR) by Dr Rayann Heman   . ESOPHAGOGASTRODUODENOSCOPY N/A 02/16/2015   Procedure: ESOPHAGOGASTRODUODENOSCOPY (EGD);  Surgeon: Wilford Corner, MD;  Location: [facility] ENDOSCOPY;  Service: Endoscopy;  Laterality: N/A;  . IR CT HEAD LTD  09/21/2019  . IR PERCUTANEOUS ART THROMBECTOMY/INFUSION INTRACRANIAL INC DIAG ANGIO  09/21/2019  . PACEMAKER INSERTION     MDT implanted by Dr Blanch Media.  Atrial lead placement was unsuccessful.  She has chronic AV dysociation  . RADIOLOGY WITH ANESTHESIA N/A 09/21/2019   Procedure: IR WITH ANESTHESIA CODE STROKE;  Surgeon: Radiologist, Medication, MD;  Location: [facility];  Service: Radiology;  Laterality: N/A;  . remote right radical nephrectomy  2009    Social History   Tobacco Use  Smoking Status Never Smoker  Smokeless Tobacco Never Used    Social History   Substance and Sexual Activity  Alcohol Use  No  . Alcohol/week: 0.0 standard drinks    Family History  Problem Relation Age of Onset  . Heart disease Maternal Grandmother   . Heart disease Mother     Review of Systems: As noted in history of present illness.  All other systems were reviewed and are negative.  Physical Exam: BP 130/76   Pulse 87   Temp (!) 97 F (36.1 C)   Ht 5\' 7"  (1.702 m)   Wt 206 lb 6.4 oz (93.6 kg)   SpO2 94%   BMI 32.33 kg/m  GENERAL:  Well appearing overweight WF in NAD HEENT:  PERRL, EOMI, sclera are clear. Oropharynx is clear. NECK:  No jugular venous distention, carotid upstroke brisk and symmetric, no bruits, no thyromegaly or adenopathy LUNGS:  Clear to auscultation bilaterally CHEST:  Unremarkable HEART:  RRR,  PMI not displaced or sustained,S1 and S2 within normal limits, no S3, no S4: no clicks, no rubs, no murmurs ABD:  Soft, nontender. BS +, no masses or bruits. No hepatomegaly, no splenomegaly EXT:  2 + pulses throughout, trace edema. SKIN:  Warm and dry.  No rashes NEURO:  Alert and oriented x 3. Cranial nerves II through XII intact. PSYCH:  Cognitively intact      LABORATORY DATA: Lab Results  Component Value Date   WBC 9.1 09/21/2019   HGB 14.3 09/21/2019   HCT 42.0 09/21/2019   PLT 233 09/21/2019   GLUCOSE 101 (H) 09/23/2019   CHOL 156 09/22/2019   TRIG 45 09/22/2019   HDL 58 09/22/2019   LDLCALC 89 09/22/2019   ALT 14 09/23/2019   AST 17 09/23/2019   NA 142 09/23/2019   K 4.4 09/23/2019   CL 108 09/23/2019   CREATININE 1.35 (H) 09/23/2019   BUN 25 (H) 09/23/2019   CO2 21 (L) 09/23/2019   TSH 2.72 09/17/2019   INR 1.0 09/21/2019   HGBA1C 6.1 (H) 09/22/2019   MICROALBUR 2.0 (H) 11/30/2018   Labs dated 04/12/16: cholesterol 178, triglycerides 101, LDL 94, HDL 64. BUN 18, creatinine 1.24. Other chemistries, TSH, CBC normal. Dated 01/24/17: HDL 62, triglycerides 83, A1c 5.9%.  Dated 08/28/17: cholesterol 185, triglycerides 81, HDL 75, LDL 94. A1c 6.1%.   .Echo  09/22/19: IMPRESSIONS    1. Left ventricular ejection fraction, by estimation, is 55 to 60%. The  left ventricle has normal function. The left ventricle has no regional  wall motion abnormalities. Left ventricular diastolic function could not  be evaluated.  2. Right ventricular systolic function is normal. The right ventricular  size is normal. There is mildly elevated pulmonary artery systolic  pressure. The estimated right ventricular systolic pressure is 78.6 mmHg.  3. The mitral valve is grossly normal. No evidence of mitral valve  regurgitation. No evidence of mitral stenosis.  4. The aortic valve is tricuspid. Aortic valve regurgitation is not  visualized. Mild aortic valve sclerosis is present, with no evidence of  aortic valve stenosis.  5. The inferior vena cava is dilated in size with <50% respiratory  variability, suggesting right atrial pressure of 15 mmHg.    Assessment / Plan: 1. History complete heart block status post permanent VVIR pacemaker placement in June of 2009. Revised 02/23/15.  Clinically doing well. She is followed by Dr. Rayann Heman in the pacer clinic.  2. History of orthostatic hypotension-on midodrine once a day. Asymptomatic.  3. Hyperlipidemia reports intolerance of lipitor. LDL 89 in hospital. Now on Crestor 5 mg daily. Will repeat fasting lab in 3 months.  4. History of recurrent diverticular bleed.   5. LE edema due to venous stasis.   6. Recent right MCA aborted CVA treated with TPA and mechanical thrombectomy. Did take Plavix for 3 weeks. Plan was to continue ASA  but she stopped ASA due to bleeding. Will not take. Noted Atrial flutter on event monitor. With prior cerebellar bleed in 2020 I doubt she is a candidate for anticoagulation. Will discuss with Dr Leonie Man. It may be a moot point since she is not willing to take anything that will cause her to bleed.   7. Atrial flutter - noted on event monitor.  Rate controlled and asymptomatic. No  additional therapy needed.   Follow up 3 months.

## 2019-11-08 NOTE — Telephone Encounter (Signed)
Fax received.  Pt with aflutter.  Pt not on anticoagulation d/t history of GI bleed and cerebral hemorrhage (?)  Appt made with [name] 11/09/19 at 10:00 am to discuss blood thinner d/t recent CVA.  Pt notified and will come to appt.

## 2019-11-09 ENCOUNTER — Other Ambulatory Visit: Payer: Self-pay

## 2019-11-09 ENCOUNTER — Ambulatory Visit (INDEPENDENT_AMBULATORY_CARE_PROVIDER_SITE_OTHER): Payer: Medicare Other | Admitting: Cardiology

## 2019-11-09 ENCOUNTER — Encounter: Payer: Self-pay | Admitting: Cardiology

## 2019-11-09 VITALS — BP 130/76 | HR 87 | Temp 97.0°F | Ht 67.0 in | Wt 206.4 lb

## 2019-11-09 DIAGNOSIS — Z950 Presence of cardiac pacemaker: Secondary | ICD-10-CM

## 2019-11-09 DIAGNOSIS — I4892 Unspecified atrial flutter: Secondary | ICD-10-CM | POA: Diagnosis not present

## 2019-11-09 DIAGNOSIS — I442 Atrioventricular block, complete: Secondary | ICD-10-CM | POA: Diagnosis not present

## 2019-11-09 DIAGNOSIS — I639 Cerebral infarction, unspecified: Secondary | ICD-10-CM

## 2019-11-09 NOTE — Progress Notes (Signed)
I agree with the above plan 

## 2019-11-10 DIAGNOSIS — E039 Hypothyroidism, unspecified: Secondary | ICD-10-CM | POA: Diagnosis not present

## 2019-11-10 DIAGNOSIS — I872 Venous insufficiency (chronic) (peripheral): Secondary | ICD-10-CM | POA: Diagnosis not present

## 2019-11-10 DIAGNOSIS — E785 Hyperlipidemia, unspecified: Secondary | ICD-10-CM | POA: Diagnosis not present

## 2019-11-10 DIAGNOSIS — M8580 Other specified disorders of bone density and structure, unspecified site: Secondary | ICD-10-CM | POA: Diagnosis not present

## 2019-11-10 DIAGNOSIS — I442 Atrioventricular block, complete: Secondary | ICD-10-CM | POA: Diagnosis not present

## 2019-11-10 DIAGNOSIS — L97412 Non-pressure chronic ulcer of right heel and midfoot with fat layer exposed: Secondary | ICD-10-CM | POA: Diagnosis not present

## 2019-11-12 DIAGNOSIS — L97412 Non-pressure chronic ulcer of right heel and midfoot with fat layer exposed: Secondary | ICD-10-CM | POA: Diagnosis not present

## 2019-11-12 DIAGNOSIS — M8580 Other specified disorders of bone density and structure, unspecified site: Secondary | ICD-10-CM | POA: Diagnosis not present

## 2019-11-12 DIAGNOSIS — I872 Venous insufficiency (chronic) (peripheral): Secondary | ICD-10-CM | POA: Diagnosis not present

## 2019-11-12 DIAGNOSIS — E039 Hypothyroidism, unspecified: Secondary | ICD-10-CM | POA: Diagnosis not present

## 2019-11-12 DIAGNOSIS — E785 Hyperlipidemia, unspecified: Secondary | ICD-10-CM | POA: Diagnosis not present

## 2019-11-12 DIAGNOSIS — I442 Atrioventricular block, complete: Secondary | ICD-10-CM | POA: Diagnosis not present

## 2019-11-16 ENCOUNTER — Telehealth: Payer: Self-pay | Admitting: Podiatry

## 2019-11-16 DIAGNOSIS — E785 Hyperlipidemia, unspecified: Secondary | ICD-10-CM | POA: Diagnosis not present

## 2019-11-16 DIAGNOSIS — I442 Atrioventricular block, complete: Secondary | ICD-10-CM | POA: Diagnosis not present

## 2019-11-16 DIAGNOSIS — I872 Venous insufficiency (chronic) (peripheral): Secondary | ICD-10-CM | POA: Diagnosis not present

## 2019-11-16 DIAGNOSIS — M8580 Other specified disorders of bone density and structure, unspecified site: Secondary | ICD-10-CM | POA: Diagnosis not present

## 2019-11-16 DIAGNOSIS — L97412 Non-pressure chronic ulcer of right heel and midfoot with fat layer exposed: Secondary | ICD-10-CM | POA: Diagnosis not present

## 2019-11-16 DIAGNOSIS — E039 Hypothyroidism, unspecified: Secondary | ICD-10-CM | POA: Diagnosis not present

## 2019-11-16 NOTE — Telephone Encounter (Signed)
Crystal from [facility] called to see if Dr.Evans has signed the orders that were faxed over

## 2019-11-17 NOTE — Telephone Encounter (Signed)
Signed. - Dr. [name]

## 2019-11-18 DIAGNOSIS — L97412 Non-pressure chronic ulcer of right heel and midfoot with fat layer exposed: Secondary | ICD-10-CM | POA: Diagnosis not present

## 2019-11-18 DIAGNOSIS — M8580 Other specified disorders of bone density and structure, unspecified site: Secondary | ICD-10-CM | POA: Diagnosis not present

## 2019-11-18 DIAGNOSIS — I442 Atrioventricular block, complete: Secondary | ICD-10-CM | POA: Diagnosis not present

## 2019-11-18 DIAGNOSIS — E039 Hypothyroidism, unspecified: Secondary | ICD-10-CM | POA: Diagnosis not present

## 2019-11-18 DIAGNOSIS — I872 Venous insufficiency (chronic) (peripheral): Secondary | ICD-10-CM | POA: Diagnosis not present

## 2019-11-18 DIAGNOSIS — E785 Hyperlipidemia, unspecified: Secondary | ICD-10-CM | POA: Diagnosis not present

## 2019-11-19 ENCOUNTER — Other Ambulatory Visit: Payer: Self-pay | Admitting: Cardiology

## 2019-11-19 DIAGNOSIS — I639 Cerebral infarction, unspecified: Secondary | ICD-10-CM

## 2019-11-19 DIAGNOSIS — I4891 Unspecified atrial fibrillation: Secondary | ICD-10-CM

## 2019-11-23 DIAGNOSIS — I872 Venous insufficiency (chronic) (peripheral): Secondary | ICD-10-CM | POA: Diagnosis not present

## 2019-11-23 DIAGNOSIS — I442 Atrioventricular block, complete: Secondary | ICD-10-CM | POA: Diagnosis not present

## 2019-11-23 DIAGNOSIS — L97412 Non-pressure chronic ulcer of right heel and midfoot with fat layer exposed: Secondary | ICD-10-CM | POA: Diagnosis not present

## 2019-11-23 DIAGNOSIS — E785 Hyperlipidemia, unspecified: Secondary | ICD-10-CM | POA: Diagnosis not present

## 2019-11-23 DIAGNOSIS — M8580 Other specified disorders of bone density and structure, unspecified site: Secondary | ICD-10-CM | POA: Diagnosis not present

## 2019-11-23 DIAGNOSIS — E039 Hypothyroidism, unspecified: Secondary | ICD-10-CM | POA: Diagnosis not present

## 2019-11-25 DIAGNOSIS — M8580 Other specified disorders of bone density and structure, unspecified site: Secondary | ICD-10-CM | POA: Diagnosis not present

## 2019-11-25 DIAGNOSIS — I442 Atrioventricular block, complete: Secondary | ICD-10-CM | POA: Diagnosis not present

## 2019-11-25 DIAGNOSIS — I872 Venous insufficiency (chronic) (peripheral): Secondary | ICD-10-CM | POA: Diagnosis not present

## 2019-11-25 DIAGNOSIS — L97412 Non-pressure chronic ulcer of right heel and midfoot with fat layer exposed: Secondary | ICD-10-CM | POA: Diagnosis not present

## 2019-11-25 DIAGNOSIS — E785 Hyperlipidemia, unspecified: Secondary | ICD-10-CM | POA: Diagnosis not present

## 2019-11-25 DIAGNOSIS — E039 Hypothyroidism, unspecified: Secondary | ICD-10-CM | POA: Diagnosis not present

## 2019-11-29 DIAGNOSIS — L97412 Non-pressure chronic ulcer of right heel and midfoot with fat layer exposed: Secondary | ICD-10-CM | POA: Diagnosis not present

## 2019-11-29 DIAGNOSIS — I442 Atrioventricular block, complete: Secondary | ICD-10-CM | POA: Diagnosis not present

## 2019-11-29 DIAGNOSIS — I872 Venous insufficiency (chronic) (peripheral): Secondary | ICD-10-CM | POA: Diagnosis not present

## 2019-11-29 DIAGNOSIS — M8580 Other specified disorders of bone density and structure, unspecified site: Secondary | ICD-10-CM | POA: Diagnosis not present

## 2019-11-29 DIAGNOSIS — E039 Hypothyroidism, unspecified: Secondary | ICD-10-CM | POA: Diagnosis not present

## 2019-11-29 DIAGNOSIS — E785 Hyperlipidemia, unspecified: Secondary | ICD-10-CM | POA: Diagnosis not present

## 2019-12-02 DIAGNOSIS — E785 Hyperlipidemia, unspecified: Secondary | ICD-10-CM | POA: Diagnosis not present

## 2019-12-02 DIAGNOSIS — E039 Hypothyroidism, unspecified: Secondary | ICD-10-CM | POA: Diagnosis not present

## 2019-12-02 DIAGNOSIS — M8580 Other specified disorders of bone density and structure, unspecified site: Secondary | ICD-10-CM | POA: Diagnosis not present

## 2019-12-02 DIAGNOSIS — I872 Venous insufficiency (chronic) (peripheral): Secondary | ICD-10-CM | POA: Diagnosis not present

## 2019-12-02 DIAGNOSIS — L97412 Non-pressure chronic ulcer of right heel and midfoot with fat layer exposed: Secondary | ICD-10-CM | POA: Diagnosis not present

## 2019-12-02 DIAGNOSIS — I442 Atrioventricular block, complete: Secondary | ICD-10-CM | POA: Diagnosis not present

## 2019-12-06 DIAGNOSIS — I872 Venous insufficiency (chronic) (peripheral): Secondary | ICD-10-CM | POA: Diagnosis not present

## 2019-12-06 DIAGNOSIS — E785 Hyperlipidemia, unspecified: Secondary | ICD-10-CM | POA: Diagnosis not present

## 2019-12-06 DIAGNOSIS — I442 Atrioventricular block, complete: Secondary | ICD-10-CM | POA: Diagnosis not present

## 2019-12-06 DIAGNOSIS — E039 Hypothyroidism, unspecified: Secondary | ICD-10-CM | POA: Diagnosis not present

## 2019-12-06 DIAGNOSIS — L97412 Non-pressure chronic ulcer of right heel and midfoot with fat layer exposed: Secondary | ICD-10-CM | POA: Diagnosis not present

## 2019-12-06 DIAGNOSIS — M8580 Other specified disorders of bone density and structure, unspecified site: Secondary | ICD-10-CM | POA: Diagnosis not present

## 2019-12-08 ENCOUNTER — Other Ambulatory Visit: Payer: Self-pay

## 2019-12-08 ENCOUNTER — Ambulatory Visit (INDEPENDENT_AMBULATORY_CARE_PROVIDER_SITE_OTHER): Payer: Medicare Other | Admitting: Podiatry

## 2019-12-08 DIAGNOSIS — E785 Hyperlipidemia, unspecified: Secondary | ICD-10-CM | POA: Diagnosis not present

## 2019-12-08 DIAGNOSIS — Z950 Presence of cardiac pacemaker: Secondary | ICD-10-CM | POA: Diagnosis not present

## 2019-12-08 DIAGNOSIS — I951 Orthostatic hypotension: Secondary | ICD-10-CM | POA: Diagnosis not present

## 2019-12-08 DIAGNOSIS — F419 Anxiety disorder, unspecified: Secondary | ICD-10-CM | POA: Diagnosis not present

## 2019-12-08 DIAGNOSIS — L97512 Non-pressure chronic ulcer of other part of right foot with fat layer exposed: Secondary | ICD-10-CM | POA: Diagnosis not present

## 2019-12-08 DIAGNOSIS — Z853 Personal history of malignant neoplasm of breast: Secondary | ICD-10-CM | POA: Diagnosis not present

## 2019-12-08 DIAGNOSIS — I872 Venous insufficiency (chronic) (peripheral): Secondary | ICD-10-CM | POA: Diagnosis not present

## 2019-12-08 DIAGNOSIS — I2510 Atherosclerotic heart disease of native coronary artery without angina pectoris: Secondary | ICD-10-CM | POA: Diagnosis not present

## 2019-12-08 DIAGNOSIS — I5030 Unspecified diastolic (congestive) heart failure: Secondary | ICD-10-CM | POA: Diagnosis not present

## 2019-12-08 DIAGNOSIS — E039 Hypothyroidism, unspecified: Secondary | ICD-10-CM | POA: Diagnosis not present

## 2019-12-08 DIAGNOSIS — I442 Atrioventricular block, complete: Secondary | ICD-10-CM | POA: Diagnosis not present

## 2019-12-08 DIAGNOSIS — L97412 Non-pressure chronic ulcer of right heel and midfoot with fat layer exposed: Secondary | ICD-10-CM | POA: Diagnosis not present

## 2019-12-08 DIAGNOSIS — M8580 Other specified disorders of bone density and structure, unspecified site: Secondary | ICD-10-CM | POA: Diagnosis not present

## 2019-12-08 DIAGNOSIS — Z8673 Personal history of transient ischemic attack (TIA), and cerebral infarction without residual deficits: Secondary | ICD-10-CM | POA: Diagnosis not present

## 2019-12-08 DIAGNOSIS — Z8616 Personal history of COVID-19: Secondary | ICD-10-CM | POA: Diagnosis not present

## 2019-12-08 NOTE — Progress Notes (Signed)
.     Subjective:  86 y.o. female presenting today for follow up evaluation of an ulceration of the right foot.  Patient has been doing well and she has been nurses coming out to her home 2 times per week for dressing changes.  She is very satisfied with the home health nurse agency and she has no new complaints at this time  Past Medical History:  Diagnosis Date  . Cataract   . Chronic congestive heart failure, unspecified heart failure type ([place]) 09/19/2019  . Complete heart block (HCC)    s/p PPM implant (MDT) by Dr Blanch Media.  Atrial lead could not be paced at time of the procedure.  She has chronic AV dysociation  . Delusional disorder ([place])   . Exogenous obesity   . Hypercholesterolemia   . Invasive ductal carcinoma of breast ([place]) 03/2007   BILATERAL BREASTS  . Orthostatic hypotension    treated with midodrine by Dr Rollene [name]  . PONV (postoperative nausea and vomiting)   . Thyroid disease   . Venous insufficiency        Objective/Physical Exam General: The patient is alert and oriented x3 in no acute distress.  Dermatology:  Wound #1 noted to the right foot measuring 1.5x1.0 x 0.2 cm (LxWxD).   To the noted ulceration(s), there is no eschar. There is a moderate amount of slough, fibrin, and necrotic tissue noted. Granulation tissue and wound base is red. There is a minimal amount of serosanguineous drainage noted. There is no exposed bone muscle-tendon ligament or joint. There is no malodor. Periwound integrity is intact. Erythema and edema noted to the right lower extremity.   Vascular: Palpable pedal pulses bilaterally. Capillary refill within normal limits. Varicosities noted bilateral lower extremities.   Neurological: Epicritic and protective threshold diminished bilaterally.   Musculoskeletal Exam: Advanced degenerative changes noted to the bilateral feet with arch collapse and progressive DJD given the patient's age.  Assessment: #1 ulceration of the right foot  secondary to venous insufficiency #2 varicosities bilateral lower extremities   Plan of Care:  #1 Patient was evaluated.  #2 medically necessary excisional debridement including subcutaneous tissue was performed using a tissue nipper and a chisel blade. Excisional debridement of all the necrotic nonviable tissue down to healthy bleeding viable tissue was performed with post-debridement measurements same as pre-. #3 the wound was cleansed with normal saline. #4 Dry sterile dressing applied.   #5 Continue home health dressings at home 2 times per week.  #6 Return to clinic in 6 weeks for follow-up and routine nail trim.      Edrick Kins, DPM [facility]  Dr. Edrick Kins, [place]                                        [place], [place] [address]                Office [phone]  Fax [phone]

## 2019-12-09 DIAGNOSIS — I872 Venous insufficiency (chronic) (peripheral): Secondary | ICD-10-CM | POA: Diagnosis not present

## 2019-12-09 DIAGNOSIS — I442 Atrioventricular block, complete: Secondary | ICD-10-CM | POA: Diagnosis not present

## 2019-12-09 DIAGNOSIS — L97412 Non-pressure chronic ulcer of right heel and midfoot with fat layer exposed: Secondary | ICD-10-CM | POA: Diagnosis not present

## 2019-12-09 DIAGNOSIS — M8580 Other specified disorders of bone density and structure, unspecified site: Secondary | ICD-10-CM | POA: Diagnosis not present

## 2019-12-09 DIAGNOSIS — E039 Hypothyroidism, unspecified: Secondary | ICD-10-CM | POA: Diagnosis not present

## 2019-12-09 DIAGNOSIS — E785 Hyperlipidemia, unspecified: Secondary | ICD-10-CM | POA: Diagnosis not present

## 2019-12-14 DIAGNOSIS — E785 Hyperlipidemia, unspecified: Secondary | ICD-10-CM | POA: Diagnosis not present

## 2019-12-14 DIAGNOSIS — L97412 Non-pressure chronic ulcer of right heel and midfoot with fat layer exposed: Secondary | ICD-10-CM | POA: Diagnosis not present

## 2019-12-14 DIAGNOSIS — E039 Hypothyroidism, unspecified: Secondary | ICD-10-CM | POA: Diagnosis not present

## 2019-12-14 DIAGNOSIS — I442 Atrioventricular block, complete: Secondary | ICD-10-CM | POA: Diagnosis not present

## 2019-12-14 DIAGNOSIS — I872 Venous insufficiency (chronic) (peripheral): Secondary | ICD-10-CM | POA: Diagnosis not present

## 2019-12-14 DIAGNOSIS — M8580 Other specified disorders of bone density and structure, unspecified site: Secondary | ICD-10-CM | POA: Diagnosis not present

## 2019-12-16 ENCOUNTER — Other Ambulatory Visit: Payer: Self-pay | Admitting: Family Medicine

## 2019-12-16 DIAGNOSIS — I872 Venous insufficiency (chronic) (peripheral): Secondary | ICD-10-CM | POA: Diagnosis not present

## 2019-12-16 DIAGNOSIS — I442 Atrioventricular block, complete: Secondary | ICD-10-CM | POA: Diagnosis not present

## 2019-12-16 DIAGNOSIS — M8580 Other specified disorders of bone density and structure, unspecified site: Secondary | ICD-10-CM | POA: Diagnosis not present

## 2019-12-16 DIAGNOSIS — E785 Hyperlipidemia, unspecified: Secondary | ICD-10-CM | POA: Diagnosis not present

## 2019-12-16 DIAGNOSIS — E039 Hypothyroidism, unspecified: Secondary | ICD-10-CM | POA: Diagnosis not present

## 2019-12-16 DIAGNOSIS — L97412 Non-pressure chronic ulcer of right heel and midfoot with fat layer exposed: Secondary | ICD-10-CM | POA: Diagnosis not present

## 2019-12-21 DIAGNOSIS — E039 Hypothyroidism, unspecified: Secondary | ICD-10-CM | POA: Diagnosis not present

## 2019-12-21 DIAGNOSIS — I442 Atrioventricular block, complete: Secondary | ICD-10-CM | POA: Diagnosis not present

## 2019-12-21 DIAGNOSIS — E785 Hyperlipidemia, unspecified: Secondary | ICD-10-CM | POA: Diagnosis not present

## 2019-12-21 DIAGNOSIS — M8580 Other specified disorders of bone density and structure, unspecified site: Secondary | ICD-10-CM | POA: Diagnosis not present

## 2019-12-21 DIAGNOSIS — L97412 Non-pressure chronic ulcer of right heel and midfoot with fat layer exposed: Secondary | ICD-10-CM | POA: Diagnosis not present

## 2019-12-21 DIAGNOSIS — Z23 Encounter for immunization: Secondary | ICD-10-CM | POA: Diagnosis not present

## 2019-12-21 DIAGNOSIS — I872 Venous insufficiency (chronic) (peripheral): Secondary | ICD-10-CM | POA: Diagnosis not present

## 2019-12-23 DIAGNOSIS — M8580 Other specified disorders of bone density and structure, unspecified site: Secondary | ICD-10-CM | POA: Diagnosis not present

## 2019-12-23 DIAGNOSIS — I872 Venous insufficiency (chronic) (peripheral): Secondary | ICD-10-CM | POA: Diagnosis not present

## 2019-12-23 DIAGNOSIS — L97412 Non-pressure chronic ulcer of right heel and midfoot with fat layer exposed: Secondary | ICD-10-CM | POA: Diagnosis not present

## 2019-12-23 DIAGNOSIS — I442 Atrioventricular block, complete: Secondary | ICD-10-CM | POA: Diagnosis not present

## 2019-12-23 DIAGNOSIS — E785 Hyperlipidemia, unspecified: Secondary | ICD-10-CM | POA: Diagnosis not present

## 2019-12-23 DIAGNOSIS — E039 Hypothyroidism, unspecified: Secondary | ICD-10-CM | POA: Diagnosis not present

## 2019-12-24 ENCOUNTER — Other Ambulatory Visit: Payer: Self-pay

## 2019-12-24 NOTE — Patient Outreach (Signed)
[facility]) Care Management  12/24/2019  [name] [birth year] [id number]  First telephone outreach attempt to obtain mRS. No answer-called home and mobile number and called [facility]-POA for the patient, no voicemail were set up. Not able to leave a message.  [facility] Management Assistant [phone]

## 2019-12-27 DIAGNOSIS — E039 Hypothyroidism, unspecified: Secondary | ICD-10-CM | POA: Diagnosis not present

## 2019-12-27 DIAGNOSIS — L97412 Non-pressure chronic ulcer of right heel and midfoot with fat layer exposed: Secondary | ICD-10-CM | POA: Diagnosis not present

## 2019-12-27 DIAGNOSIS — I442 Atrioventricular block, complete: Secondary | ICD-10-CM | POA: Diagnosis not present

## 2019-12-27 DIAGNOSIS — E785 Hyperlipidemia, unspecified: Secondary | ICD-10-CM | POA: Diagnosis not present

## 2019-12-27 DIAGNOSIS — M8580 Other specified disorders of bone density and structure, unspecified site: Secondary | ICD-10-CM | POA: Diagnosis not present

## 2019-12-27 DIAGNOSIS — I872 Venous insufficiency (chronic) (peripheral): Secondary | ICD-10-CM | POA: Diagnosis not present

## 2019-12-28 ENCOUNTER — Other Ambulatory Visit: Payer: Self-pay

## 2019-12-28 NOTE — Patient Outreach (Signed)
[facility]) Care Management  12/28/2019  [name] [birth year] [id number]  Telephone outreach to patient to obtain mRS was successfully completed. MRS= 0.  [place] Management Assistant [phone]

## 2020-01-03 DIAGNOSIS — E785 Hyperlipidemia, unspecified: Secondary | ICD-10-CM | POA: Diagnosis not present

## 2020-01-03 DIAGNOSIS — I872 Venous insufficiency (chronic) (peripheral): Secondary | ICD-10-CM | POA: Diagnosis not present

## 2020-01-03 DIAGNOSIS — M8580 Other specified disorders of bone density and structure, unspecified site: Secondary | ICD-10-CM | POA: Diagnosis not present

## 2020-01-03 DIAGNOSIS — I442 Atrioventricular block, complete: Secondary | ICD-10-CM | POA: Diagnosis not present

## 2020-01-03 DIAGNOSIS — L97412 Non-pressure chronic ulcer of right heel and midfoot with fat layer exposed: Secondary | ICD-10-CM | POA: Diagnosis not present

## 2020-01-03 DIAGNOSIS — E039 Hypothyroidism, unspecified: Secondary | ICD-10-CM | POA: Diagnosis not present

## 2020-01-07 DIAGNOSIS — M8580 Other specified disorders of bone density and structure, unspecified site: Secondary | ICD-10-CM | POA: Diagnosis not present

## 2020-01-07 DIAGNOSIS — I2510 Atherosclerotic heart disease of native coronary artery without angina pectoris: Secondary | ICD-10-CM | POA: Diagnosis not present

## 2020-01-07 DIAGNOSIS — Z950 Presence of cardiac pacemaker: Secondary | ICD-10-CM | POA: Diagnosis not present

## 2020-01-07 DIAGNOSIS — I872 Venous insufficiency (chronic) (peripheral): Secondary | ICD-10-CM | POA: Diagnosis not present

## 2020-01-07 DIAGNOSIS — I5030 Unspecified diastolic (congestive) heart failure: Secondary | ICD-10-CM | POA: Diagnosis not present

## 2020-01-07 DIAGNOSIS — L97412 Non-pressure chronic ulcer of right heel and midfoot with fat layer exposed: Secondary | ICD-10-CM | POA: Diagnosis not present

## 2020-01-07 DIAGNOSIS — I951 Orthostatic hypotension: Secondary | ICD-10-CM | POA: Diagnosis not present

## 2020-01-07 DIAGNOSIS — Z8673 Personal history of transient ischemic attack (TIA), and cerebral infarction without residual deficits: Secondary | ICD-10-CM | POA: Diagnosis not present

## 2020-01-07 DIAGNOSIS — E039 Hypothyroidism, unspecified: Secondary | ICD-10-CM | POA: Diagnosis not present

## 2020-01-07 DIAGNOSIS — Z853 Personal history of malignant neoplasm of breast: Secondary | ICD-10-CM | POA: Diagnosis not present

## 2020-01-07 DIAGNOSIS — F419 Anxiety disorder, unspecified: Secondary | ICD-10-CM | POA: Diagnosis not present

## 2020-01-07 DIAGNOSIS — I442 Atrioventricular block, complete: Secondary | ICD-10-CM | POA: Diagnosis not present

## 2020-01-07 DIAGNOSIS — Z8616 Personal history of COVID-19: Secondary | ICD-10-CM | POA: Diagnosis not present

## 2020-01-07 DIAGNOSIS — E785 Hyperlipidemia, unspecified: Secondary | ICD-10-CM | POA: Diagnosis not present

## 2020-01-10 ENCOUNTER — Telehealth: Payer: Self-pay | Admitting: Family Medicine

## 2020-01-10 DIAGNOSIS — E876 Hypokalemia: Secondary | ICD-10-CM

## 2020-01-10 DIAGNOSIS — E039 Hypothyroidism, unspecified: Secondary | ICD-10-CM

## 2020-01-10 DIAGNOSIS — I872 Venous insufficiency (chronic) (peripheral): Secondary | ICD-10-CM

## 2020-01-10 MED ORDER — LEVOTHYROXINE SODIUM 50 MCG PO TABS: 50.0000 ug | ORAL_TABLET | Freq: Every day | ORAL | 2 refills | Status: DC

## 2020-01-10 MED ORDER — LATANOPROST 0.005 % OP SOLN: 1.0000 [drp] | Freq: Every day | OPHTHALMIC | 10 refills | Status: AC

## 2020-01-10 MED ORDER — FUROSEMIDE 20 MG PO TABS: 10.0000 mg | ORAL_TABLET | Freq: Every day | ORAL | 2 refills | Status: DC

## 2020-01-10 MED ORDER — POTASSIUM CHLORIDE ER 20 MEQ PO TBCR: 20.0000 meq | EXTENDED_RELEASE_TABLET | Freq: Every day | ORAL | 3 refills | Status: DC

## 2020-01-10 MED ORDER — MIDODRINE HCL 2.5 MG PO TABS: 2.5000 mg | ORAL_TABLET | Freq: Every day | ORAL | 2 refills | Status: DC

## 2020-01-10 MED ORDER — FESOTERODINE FUMARATE ER 4 MG PO TB24: 4.0000 mg | ORAL_TABLET | Freq: Every day | ORAL | 3 refills | Status: DC

## 2020-01-10 MED ORDER — ROSUVASTATIN CALCIUM 5 MG PO TABS: 5.0000 mg | ORAL_TABLET | Freq: Every day | ORAL | 3 refills | Status: DC

## 2020-01-10 NOTE — Telephone Encounter (Signed)
Rx's sent in. °

## 2020-01-10 NOTE — Telephone Encounter (Signed)
need all the following  RX for mail order  [facility]  pt said she needed furosemide (LASIX) 20 MG tablet 10 mg instead of 20; she needs this medication now.  Calcium Carbonate-Vitamin D (CALTRATE 600+D PO) furosemide (LASIX) 20 MG tablet Lactobacillus Rhamnosus, GG, (CULTURELLE PO) latanoprost (XALATAN) 0.005 % ophthalmic solution levothyroxine (SYNTHROID) 50 MCG tablet midodrine (PROAMATINE) 2.5 MG tablet Potassium Chloride ER 20 MEQ TBCR rosuvastatin (CRESTOR) 5 MG tablet TOVIAZ 4 MG TB24 tablet

## 2020-01-10 NOTE — Telephone Encounter (Signed)
need all the following  RX for mail order  [facility]  pt said she needed furosemide (LASIX) 20 MG tablet 10 mg instead of 20; she needs this medication now. Please call [phone]- [facility] Calcium Carbonate-Vitamin D (CALTRATE 600+D PO) furosemide (LASIX) 20 MG tablet Lactobacillus Rhamnosus, GG, (CULTURELLE PO) latanoprost (XALATAN) 0.005 % ophthalmic solution levothyroxine (SYNTHROID) 50 MCG tablet midodrine (PROAMATINE) 2.5 MG tablet Potassium Chloride ER 20 MEQ TBCR rosuvastatin (CRESTOR) 5 MG tablet TOVIAZ 4 MG TB24 tablet

## 2020-01-10 NOTE — Addendum Note (Signed)
Addended by: Nathanial Millman E on: 01/10/2020 02:07 PM   Modules accepted: Orders

## 2020-01-11 DIAGNOSIS — I5030 Unspecified diastolic (congestive) heart failure: Secondary | ICD-10-CM | POA: Diagnosis not present

## 2020-01-11 DIAGNOSIS — I872 Venous insufficiency (chronic) (peripheral): Secondary | ICD-10-CM | POA: Diagnosis not present

## 2020-01-11 DIAGNOSIS — I951 Orthostatic hypotension: Secondary | ICD-10-CM | POA: Diagnosis not present

## 2020-01-11 DIAGNOSIS — I2510 Atherosclerotic heart disease of native coronary artery without angina pectoris: Secondary | ICD-10-CM | POA: Diagnosis not present

## 2020-01-11 DIAGNOSIS — I442 Atrioventricular block, complete: Secondary | ICD-10-CM | POA: Diagnosis not present

## 2020-01-11 DIAGNOSIS — L97412 Non-pressure chronic ulcer of right heel and midfoot with fat layer exposed: Secondary | ICD-10-CM | POA: Diagnosis not present

## 2020-01-13 DIAGNOSIS — I442 Atrioventricular block, complete: Secondary | ICD-10-CM | POA: Diagnosis not present

## 2020-01-13 DIAGNOSIS — I5030 Unspecified diastolic (congestive) heart failure: Secondary | ICD-10-CM | POA: Diagnosis not present

## 2020-01-13 DIAGNOSIS — L97412 Non-pressure chronic ulcer of right heel and midfoot with fat layer exposed: Secondary | ICD-10-CM | POA: Diagnosis not present

## 2020-01-13 DIAGNOSIS — I872 Venous insufficiency (chronic) (peripheral): Secondary | ICD-10-CM | POA: Diagnosis not present

## 2020-01-13 DIAGNOSIS — I951 Orthostatic hypotension: Secondary | ICD-10-CM | POA: Diagnosis not present

## 2020-01-13 DIAGNOSIS — I2510 Atherosclerotic heart disease of native coronary artery without angina pectoris: Secondary | ICD-10-CM | POA: Diagnosis not present

## 2020-01-18 DIAGNOSIS — L97412 Non-pressure chronic ulcer of right heel and midfoot with fat layer exposed: Secondary | ICD-10-CM | POA: Diagnosis not present

## 2020-01-18 DIAGNOSIS — I951 Orthostatic hypotension: Secondary | ICD-10-CM | POA: Diagnosis not present

## 2020-01-18 DIAGNOSIS — I872 Venous insufficiency (chronic) (peripheral): Secondary | ICD-10-CM | POA: Diagnosis not present

## 2020-01-18 DIAGNOSIS — I5030 Unspecified diastolic (congestive) heart failure: Secondary | ICD-10-CM | POA: Diagnosis not present

## 2020-01-18 DIAGNOSIS — I2510 Atherosclerotic heart disease of native coronary artery without angina pectoris: Secondary | ICD-10-CM | POA: Diagnosis not present

## 2020-01-18 DIAGNOSIS — I442 Atrioventricular block, complete: Secondary | ICD-10-CM | POA: Diagnosis not present

## 2020-01-19 ENCOUNTER — Ambulatory Visit (INDEPENDENT_AMBULATORY_CARE_PROVIDER_SITE_OTHER): Payer: Medicare Other | Admitting: Podiatry

## 2020-01-19 ENCOUNTER — Other Ambulatory Visit: Payer: Self-pay

## 2020-01-19 DIAGNOSIS — I872 Venous insufficiency (chronic) (peripheral): Secondary | ICD-10-CM

## 2020-01-19 DIAGNOSIS — L97512 Non-pressure chronic ulcer of other part of right foot with fat layer exposed: Secondary | ICD-10-CM | POA: Diagnosis not present

## 2020-01-19 DIAGNOSIS — B351 Tinea unguium: Secondary | ICD-10-CM | POA: Diagnosis not present

## 2020-01-19 DIAGNOSIS — M79676 Pain in unspecified toe(s): Secondary | ICD-10-CM | POA: Diagnosis not present

## 2020-01-19 NOTE — Progress Notes (Signed)
.     Subjective:  86 y.o. female presenting today for follow up evaluation of an ulceration of the right foot.  Patient has been doing well and she has been nurses coming out to her home 2 times per week for dressing changes.  She is very satisfied with the home health nurse agency  Patient is also requesting a nail trim today.  She relates pain and tenderness to the toe secondary to the nails.  She is unable to trim her own nails.  Past Medical History:  Diagnosis Date  . Cataract   . Chronic congestive heart failure, unspecified heart failure type ([place]) 09/19/2019  . Complete heart block (HCC)    s/p PPM implant (MDT) by Dr Blanch Media.  Atrial lead could not be paced at time of the procedure.  She has chronic AV dysociation  . Delusional disorder ([place])   . Exogenous obesity   . Hypercholesterolemia   . Invasive ductal carcinoma of breast ([place]) 03/2007   BILATERAL BREASTS  . Orthostatic hypotension    treated with midodrine by Dr Rollene [name]  . PONV (postoperative nausea and vomiting)   . Thyroid disease   . Venous insufficiency      Objective/Physical Exam General: The patient is alert and oriented x3 in no acute distress.  Dermatology:  Wound #1 noted to the right foot measuring 1.0x1.0 x 0.2 cm (LxWxD).  There continues to be improvement of the wound  To the noted ulceration(s), there is no eschar. There is a moderate amount of slough, fibrin, and necrotic tissue noted. Granulation tissue and wound base is red. There is a minimal amount of serosanguineous drainage noted. There is no exposed bone muscle-tendon ligament or joint. There is no malodor. Periwound integrity is intact. Erythema and edema noted to the right lower extremity.   Hyperkeratotic dystrophic thickened nails noted 1-5 bilateral  Vascular: Palpable pedal pulses bilaterally. Capillary refill within normal limits. Varicosities noted bilateral lower extremities.   Neurological: Epicritic and protective threshold  diminished bilaterally.   Musculoskeletal Exam: Advanced degenerative changes noted to the bilateral feet with arch collapse and progressive DJD given the patient's age.  Assessment: #1 ulceration of the right foot secondary to venous insufficiency #2 varicosities bilateral lower extremities   Plan of Care:  #1 Patient was evaluated.  #2 medically necessary excisional debridement including subcutaneous tissue was performed using a tissue nipper and a chisel blade. Excisional debridement of all the necrotic nonviable tissue down to healthy bleeding viable tissue was performed with post-debridement measurements same as pre-. #3 the wound was cleansed with normal saline. #4 Dry sterile dressing applied.   #5 Continue home health dressings at home 2 times per week.  #6  Mechanical debridement of nails 1-5 was performed bilateral with a nail nipper without incident or bleeding  #7 return to clinic in 6 weeks for follow-up   Edrick Kins, DPM [facility]  Dr. Edrick Kins, [address]                                        [place], [place] [address]                Office [phone]  Fax [phone]

## 2020-01-21 DIAGNOSIS — I872 Venous insufficiency (chronic) (peripheral): Secondary | ICD-10-CM | POA: Diagnosis not present

## 2020-01-21 DIAGNOSIS — I5030 Unspecified diastolic (congestive) heart failure: Secondary | ICD-10-CM | POA: Diagnosis not present

## 2020-01-21 DIAGNOSIS — I951 Orthostatic hypotension: Secondary | ICD-10-CM | POA: Diagnosis not present

## 2020-01-21 DIAGNOSIS — I2510 Atherosclerotic heart disease of native coronary artery without angina pectoris: Secondary | ICD-10-CM | POA: Diagnosis not present

## 2020-01-21 DIAGNOSIS — I442 Atrioventricular block, complete: Secondary | ICD-10-CM | POA: Diagnosis not present

## 2020-01-21 DIAGNOSIS — L97412 Non-pressure chronic ulcer of right heel and midfoot with fat layer exposed: Secondary | ICD-10-CM | POA: Diagnosis not present

## 2020-01-24 ENCOUNTER — Ambulatory Visit (INDEPENDENT_AMBULATORY_CARE_PROVIDER_SITE_OTHER): Payer: Medicare Other | Admitting: Emergency Medicine

## 2020-01-24 DIAGNOSIS — I442 Atrioventricular block, complete: Secondary | ICD-10-CM | POA: Diagnosis not present

## 2020-01-25 DIAGNOSIS — I5030 Unspecified diastolic (congestive) heart failure: Secondary | ICD-10-CM | POA: Diagnosis not present

## 2020-01-25 DIAGNOSIS — L97412 Non-pressure chronic ulcer of right heel and midfoot with fat layer exposed: Secondary | ICD-10-CM | POA: Diagnosis not present

## 2020-01-25 DIAGNOSIS — I442 Atrioventricular block, complete: Secondary | ICD-10-CM | POA: Diagnosis not present

## 2020-01-25 DIAGNOSIS — I872 Venous insufficiency (chronic) (peripheral): Secondary | ICD-10-CM | POA: Diagnosis not present

## 2020-01-25 DIAGNOSIS — I951 Orthostatic hypotension: Secondary | ICD-10-CM | POA: Diagnosis not present

## 2020-01-25 DIAGNOSIS — I2510 Atherosclerotic heart disease of native coronary artery without angina pectoris: Secondary | ICD-10-CM | POA: Diagnosis not present

## 2020-01-25 LAB — CUP PACEART REMOTE DEVICE CHECK
Battery Impedance: 2095 Ohm
Battery Remaining Longevity: 35 mo
Battery Voltage: 2.75 V
Brady Statistic RV Percent Paced: 99 %
Date Time Interrogation Session: 20210927071342
Implantable Lead Implant Date: 20141208
Implantable Lead Location: 753860
Implantable Lead Model: 4076
Implantable Pulse Generator Implant Date: 20161027
Lead Channel Impedance Value: 0 Ohm
Lead Channel Impedance Value: 400 Ohm
Lead Channel Pacing Threshold Amplitude: 1.125 V
Lead Channel Pacing Threshold Pulse Width: 0.4 ms
Lead Channel Setting Pacing Amplitude: 2.25 V
Lead Channel Setting Pacing Pulse Width: 0.4 ms
Lead Channel Setting Sensing Sensitivity: 2 mV

## 2020-01-27 DIAGNOSIS — I5030 Unspecified diastolic (congestive) heart failure: Secondary | ICD-10-CM | POA: Diagnosis not present

## 2020-01-27 DIAGNOSIS — I872 Venous insufficiency (chronic) (peripheral): Secondary | ICD-10-CM | POA: Diagnosis not present

## 2020-01-27 DIAGNOSIS — I951 Orthostatic hypotension: Secondary | ICD-10-CM | POA: Diagnosis not present

## 2020-01-27 DIAGNOSIS — I2510 Atherosclerotic heart disease of native coronary artery without angina pectoris: Secondary | ICD-10-CM | POA: Diagnosis not present

## 2020-01-27 DIAGNOSIS — L97412 Non-pressure chronic ulcer of right heel and midfoot with fat layer exposed: Secondary | ICD-10-CM | POA: Diagnosis not present

## 2020-01-27 DIAGNOSIS — I442 Atrioventricular block, complete: Secondary | ICD-10-CM | POA: Diagnosis not present

## 2020-01-27 NOTE — Progress Notes (Signed)
Remote pacemaker transmission.   

## 2020-01-28 DIAGNOSIS — I442 Atrioventricular block, complete: Secondary | ICD-10-CM | POA: Diagnosis not present

## 2020-01-28 DIAGNOSIS — I639 Cerebral infarction, unspecified: Secondary | ICD-10-CM | POA: Diagnosis not present

## 2020-01-28 DIAGNOSIS — Z950 Presence of cardiac pacemaker: Secondary | ICD-10-CM | POA: Diagnosis not present

## 2020-01-28 DIAGNOSIS — I4892 Unspecified atrial flutter: Secondary | ICD-10-CM | POA: Diagnosis not present

## 2020-01-28 LAB — BASIC METABOLIC PANEL
BUN/Creatinine Ratio: 14 (ref 12–28)
BUN: 17 mg/dL (ref 8–27)
CO2: 22 mmol/L (ref 20–29)
Calcium: 8.9 mg/dL (ref 8.7–10.3)
Chloride: 106 mmol/L (ref 96–106)
Creatinine, Ser: 1.19 mg/dL — ABNORMAL HIGH (ref 0.57–1.00)
GFR calc Af Amer: 47 mL/min/{1.73_m2} — ABNORMAL LOW (ref >59)
GFR calc non Af Amer: 41 mL/min/{1.73_m2} — ABNORMAL LOW (ref >59)
Glucose: 92 mg/dL (ref 65–99)
Potassium: 4.1 mmol/L (ref 3.5–5.2)
Sodium: 144 mmol/L (ref 134–144)

## 2020-01-28 LAB — HEPATIC FUNCTION PANEL
ALT: 11 IU/L (ref 0–32)
AST: 19 IU/L (ref 0–40)
Albumin: 4.2 g/dL (ref 3.6–4.6)
Alkaline Phosphatase: 99 IU/L (ref 44–121)
Bilirubin Total: 0.5 mg/dL (ref 0.0–1.2)
Bilirubin, Direct: 0.21 mg/dL (ref 0.00–0.40)
Total Protein: 6.7 g/dL (ref 6.0–8.5)

## 2020-01-28 LAB — LIPID PANEL
Chol/HDL Ratio: 2.3 ratio (ref 0.0–4.4)
Cholesterol, Total: 141 mg/dL (ref 100–199)
HDL: 61 mg/dL (ref >39)
LDL Chol Calc (NIH): 66 mg/dL (ref 0–99)
Triglycerides: 68 mg/dL (ref 0–149)
VLDL Cholesterol Cal: 14 mg/dL (ref 5–40)

## 2020-02-01 DIAGNOSIS — I5030 Unspecified diastolic (congestive) heart failure: Secondary | ICD-10-CM | POA: Diagnosis not present

## 2020-02-01 DIAGNOSIS — I2510 Atherosclerotic heart disease of native coronary artery without angina pectoris: Secondary | ICD-10-CM | POA: Diagnosis not present

## 2020-02-01 DIAGNOSIS — I442 Atrioventricular block, complete: Secondary | ICD-10-CM | POA: Diagnosis not present

## 2020-02-01 DIAGNOSIS — L97412 Non-pressure chronic ulcer of right heel and midfoot with fat layer exposed: Secondary | ICD-10-CM | POA: Diagnosis not present

## 2020-02-01 DIAGNOSIS — I951 Orthostatic hypotension: Secondary | ICD-10-CM | POA: Diagnosis not present

## 2020-02-01 DIAGNOSIS — I872 Venous insufficiency (chronic) (peripheral): Secondary | ICD-10-CM | POA: Diagnosis not present

## 2020-02-03 NOTE — Progress Notes (Signed)
[name] Date of Birth: February 27, 1928 Medical Record #[id number]  History of Present Illness: [name] is seen today for follow up of complete heart block.  She has a history of complete heart block with 12 second pause and underwent permanent pacemaker implant on 10/09/2007 with a [employer]onic adapta VVI pacemaker. She had revision of her pacemaker on February 23, 2015. She was admitted at that time with a GI bleed felt to be diverticular. No recurrence.  She also has a history of orthostatic dizziness managed with midodrine. She has no history of coronary disease or congestive heart failure. Last pacemaker check in December 2019 was normal.  She was admitted in June 2018 with acute diverticulitis. Treated with hydration and antibiotics. Admitted in November and December 2018 with hematochezia. Felt to be diverticular bleed. Conservative therapy recommended. ASA discontinued.   She has a history of chronic nonhealing wound on her right leg. This is being treated by podiatry. She was seen in September in the ED with complaints of asymmetric leg swelling. Doppler negative for DVT.  Of note in June 2020 she was admitted with delusional ideation.  She presented without any focal neurological symptoms or deficits but due to symptoms of delusion hallucinations had a CT scan done which showed a tiny incidental 8 mm midline cerebellar hemorrhage exact etiology unclear but unlikely to be hypertensive.  Possibly cavernous angioma. Repeat follow up CT showed resolution of hemorrhage.   She was  admitted 5/25-5/27/21 with acute right MCA CVA. She was treated emergently with TPA and mechanical thrombectomy with aborted stroke. Discharged on ASA and plavix for 3 weeks then ASA only. Started on statin. Echo was unremarkable. 30 day event monitor was scheduled. As recorded on 7/9 this showed atrial flutter with variable block rate 60. On follow up patient refused to take any anticoagulant or antiplatelet  therapy due to concerns  About bleeding.   On follow up she is doing well. Denies any chest pain, dyspnea, palpitations. No recurrent TIA or CVA symptoms.     Current Outpatient Medications on File Prior to Visit  Medication Sig Dispense Refill  . Calcium Carbonate-Vitamin D (CALTRATE 600+D PO) Take 1 tablet by mouth daily after breakfast.     . fesoterodine (TOVIAZ) 4 MG TB24 tablet Take 1 tablet (4 mg total) by mouth daily. 90 tablet 3  . furosemide (LASIX) 20 MG tablet Take 0.5 tablets (10 mg total) by mouth daily. 45 tablet 2  . Lactobacillus Rhamnosus, GG, (CULTURELLE PO) Take 1 capsule by mouth every morning.    . latanoprost (XALATAN) 0.005 % ophthalmic solution Place 1 drop into both eyes at bedtime. 2.5 mL 10  . levothyroxine (SYNTHROID) 50 MCG tablet Take 1 tablet (50 mcg total) by mouth daily before breakfast. 90 tablet 2  . midodrine (PROAMATINE) 2.5 MG tablet Take 1 tablet (2.5 mg total) by mouth daily after breakfast. 90 tablet 2  . Potassium Chloride ER 20 MEQ TBCR Take 20 mEq by mouth daily. 90 tablet 3  . rosuvastatin (CRESTOR) 5 MG tablet Take 1 tablet (5 mg total) by mouth daily. 90 tablet 3   No current facility-administered medications on file prior to visit.    Allergies  Allergen Reactions  . Amoxicillin-Pot Clavulanate Diarrhea and Nausea And Vomiting    Has patient had a PCN reaction causing immediate rash, facial/tongue/throat swelling, SOB or lightheadedness with hypotension: Yes Has patient had a PCN reaction causing severe rash involving mucus membranes or skin necrosis: No Has patient had  a PCN reaction that required hospitalization: No Has patient had a PCN reaction occurring within the last 10 years: Yes If all of the above answers are "NO", then may proceed with Cephalosporin use.   . Erythromycin Hives  . Tape Other (See Comments)    BAND AIDS-SKIN IRRITATION  . Codeine Nausea And Vomiting  . Doxycycline Diarrhea  . Flagyl [Metronidazole] Hives  .  Macrobid [Nitrofurantoin Macrocrystal] Nausea And Vomiting  . Tolterodine Nausea And Vomiting  . Ciprofloxacin Hives and Rash    Past Medical History:  Diagnosis Date  . Cataract   . Chronic congestive heart failure, unspecified heart failure type ([place]) 09/19/2019  . Complete heart block (HCC)    s/p PPM implant (MDT) by Dr Blanch Media.  Atrial lead could not be paced at time of the procedure.  She has chronic AV dysociation  . Delusional disorder ([place])   . Exogenous obesity   . Hypercholesterolemia   . Invasive ductal carcinoma of breast ([place]) 03/2007   BILATERAL BREASTS  . Orthostatic hypotension    treated with midodrine by Dr Rollene [name]  . PONV (postoperative nausea and vomiting)   . Thyroid disease   . Venous insufficiency     Past Surgical History:  Procedure Laterality Date  . ABDOMINAL HYSTERECTOMY    . BREAST LUMPECTOMY Bilateral 2009  . CATARACT EXTRACTION     EYE SURGERY X 2  . CHOLECYSTECTOMY    . COLONOSCOPY N/A 02/16/2015   Procedure: COLONOSCOPY;  Surgeon: Wilford Corner, MD;  Location: [facility] ENDOSCOPY;  Service: Endoscopy;  Laterality: N/A;  . EP IMPLANTABLE DEVICE N/A 02/23/2015   pacemaker generator change (MDT Sensia SR) by Dr Rayann Heman   . ESOPHAGOGASTRODUODENOSCOPY N/A 02/16/2015   Procedure: ESOPHAGOGASTRODUODENOSCOPY (EGD);  Surgeon: Wilford Corner, MD;  Location: [facility] ENDOSCOPY;  Service: Endoscopy;  Laterality: N/A;  . IR CT HEAD LTD  09/21/2019  . IR PERCUTANEOUS ART THROMBECTOMY/INFUSION INTRACRANIAL INC DIAG ANGIO  09/21/2019  . PACEMAKER INSERTION     MDT implanted by Dr Blanch Media.  Atrial lead placement was unsuccessful.  She has chronic AV dysociation  . RADIOLOGY WITH ANESTHESIA N/A 09/21/2019   Procedure: IR WITH ANESTHESIA CODE STROKE;  Surgeon: Radiologist, Medication, MD;  Location: [place];  Service: Radiology;  Laterality: N/A;  . remote right radical nephrectomy  2009    Social History   Tobacco Use  Smoking Status Never Smoker  Smokeless Tobacco  Never Used    Social History   Substance and Sexual Activity  Alcohol Use No  . Alcohol/week: 0.0 standard drinks    Family History  Problem Relation Age of Onset  . Heart disease Maternal Grandmother   . Heart disease Mother     Review of Systems: As noted in history of present illness.  All other systems were reviewed and are negative.  Physical Exam: BP 130/80   Pulse 88   Ht 5\' 7"  (1.702 m)   Wt 216 lb (98 kg)   SpO2 98%   BMI 33.83 kg/m  GENERAL:  Well appearing obese WF in NAD HEENT:  PERRL, EOMI, sclera are clear. Oropharynx is clear. NECK:  No jugular venous distention, carotid upstroke brisk and symmetric, no bruits, no thyromegaly or adenopathy LUNGS:  Clear to auscultation bilaterally CHEST:  Unremarkable HEART:  RRR,  PMI not displaced or sustained,S1 and S2 within normal limits, no S3, no S4: no clicks, no rubs, no murmurs ABD:  Soft, nontender. BS +, no masses or bruits. No hepatomegaly, no splenomegaly EXT:  2 +  pulses throughout, trace edema. SKIN:  Warm and dry.  No rashes NEURO:  Alert and oriented x 3. Cranial nerves II through XII intact. PSYCH:  Cognitively intact      LABORATORY DATA: Lab Results  Component Value Date   WBC 9.1 09/21/2019   HGB 14.3 09/21/2019   HCT 42.0 09/21/2019   PLT 233 09/21/2019   GLUCOSE 92 01/28/2020   CHOL 141 01/28/2020   TRIG 68 01/28/2020   HDL 61 01/28/2020   LDLCALC 66 01/28/2020   ALT 11 01/28/2020   AST 19 01/28/2020   NA 144 01/28/2020   K 4.1 01/28/2020   CL 106 01/28/2020   CREATININE 1.19 (H) 01/28/2020   BUN 17 01/28/2020   CO2 22 01/28/2020   TSH 2.72 09/17/2019   INR 1.0 09/21/2019   HGBA1C 6.1 (H) 09/22/2019   MICROALBUR 2.0 (H) 11/30/2018   Labs dated 04/12/16: cholesterol 178, triglycerides 101, LDL 94, HDL 64. BUN 18, creatinine 1.24. Other chemistries, TSH, CBC normal. Dated 01/24/17: HDL 62, triglycerides 83, A1c 5.9%.  Dated 08/28/17: cholesterol 185, triglycerides 81, HDL 75, LDL  94. A1c 6.1%.   .Echo 09/22/19: IMPRESSIONS    1. Left ventricular ejection fraction, by estimation, is 55 to 60%. The  left ventricle has normal function. The left ventricle has no regional  wall motion abnormalities. Left ventricular diastolic function could not  be evaluated.  2. Right ventricular systolic function is normal. The right ventricular  size is normal. There is mildly elevated pulmonary artery systolic  pressure. The estimated right ventricular systolic pressure is 02.5 mmHg.  3. The mitral valve is grossly normal. No evidence of mitral valve  regurgitation. No evidence of mitral stenosis.  4. The aortic valve is tricuspid. Aortic valve regurgitation is not  visualized. Mild aortic valve sclerosis is present, with no evidence of  aortic valve stenosis.  5. The inferior vena cava is dilated in size with <50% respiratory  variability, suggesting right atrial pressure of 15 mmHg.    Assessment / Plan: 1. History complete heart block status post permanent VVIR pacemaker placement in June of 2009. Revised 02/23/15.  Clinically doing well. She is followed by Dr. Rayann Heman in the pacer clinic.  2. History of orthostatic hypotension-on midodrine once a day. Asymptomatic.  3. Hyperlipidemia reports intolerance of lipitor. LDL 89 in hospital. Now on Crestor 5 mg daily. LDL at goal 66.   4. History of recurrent diverticular bleed.   5. LE edema due to venous stasis. Nonhealing venous ulcer followed by podiatry.   6. S/p  right MCA aborted CVA treated with TPA and mechanical thrombectomy. Did take Plavix for 3 weeks. Plan was to continue ASA  but she stopped ASA due to bleeding. Will not take. Noted Atrial flutter on event monitor. With prior cerebellar bleed in 2020 I doubt she is a candidate for anticoagulation. This is a  moot point since she is not willing to take anything that will cause her to bleed.   7. Atrial flutter - noted on event monitor.  Rate controlled and  asymptomatic. No additional therapy needed.   Follow up 6 months.

## 2020-02-04 DIAGNOSIS — I5030 Unspecified diastolic (congestive) heart failure: Secondary | ICD-10-CM | POA: Diagnosis not present

## 2020-02-04 DIAGNOSIS — I951 Orthostatic hypotension: Secondary | ICD-10-CM | POA: Diagnosis not present

## 2020-02-04 DIAGNOSIS — L97412 Non-pressure chronic ulcer of right heel and midfoot with fat layer exposed: Secondary | ICD-10-CM | POA: Diagnosis not present

## 2020-02-04 DIAGNOSIS — I442 Atrioventricular block, complete: Secondary | ICD-10-CM | POA: Diagnosis not present

## 2020-02-04 DIAGNOSIS — I2510 Atherosclerotic heart disease of native coronary artery without angina pectoris: Secondary | ICD-10-CM | POA: Diagnosis not present

## 2020-02-04 DIAGNOSIS — I872 Venous insufficiency (chronic) (peripheral): Secondary | ICD-10-CM | POA: Diagnosis not present

## 2020-02-06 DIAGNOSIS — I951 Orthostatic hypotension: Secondary | ICD-10-CM | POA: Diagnosis not present

## 2020-02-06 DIAGNOSIS — M8580 Other specified disorders of bone density and structure, unspecified site: Secondary | ICD-10-CM | POA: Diagnosis not present

## 2020-02-06 DIAGNOSIS — Z8673 Personal history of transient ischemic attack (TIA), and cerebral infarction without residual deficits: Secondary | ICD-10-CM | POA: Diagnosis not present

## 2020-02-06 DIAGNOSIS — Z950 Presence of cardiac pacemaker: Secondary | ICD-10-CM | POA: Diagnosis not present

## 2020-02-06 DIAGNOSIS — I442 Atrioventricular block, complete: Secondary | ICD-10-CM | POA: Diagnosis not present

## 2020-02-06 DIAGNOSIS — Z8616 Personal history of COVID-19: Secondary | ICD-10-CM | POA: Diagnosis not present

## 2020-02-06 DIAGNOSIS — F419 Anxiety disorder, unspecified: Secondary | ICD-10-CM | POA: Diagnosis not present

## 2020-02-06 DIAGNOSIS — I5030 Unspecified diastolic (congestive) heart failure: Secondary | ICD-10-CM | POA: Diagnosis not present

## 2020-02-06 DIAGNOSIS — E039 Hypothyroidism, unspecified: Secondary | ICD-10-CM | POA: Diagnosis not present

## 2020-02-06 DIAGNOSIS — E785 Hyperlipidemia, unspecified: Secondary | ICD-10-CM | POA: Diagnosis not present

## 2020-02-06 DIAGNOSIS — L97412 Non-pressure chronic ulcer of right heel and midfoot with fat layer exposed: Secondary | ICD-10-CM | POA: Diagnosis not present

## 2020-02-06 DIAGNOSIS — I2510 Atherosclerotic heart disease of native coronary artery without angina pectoris: Secondary | ICD-10-CM | POA: Diagnosis not present

## 2020-02-06 DIAGNOSIS — I872 Venous insufficiency (chronic) (peripheral): Secondary | ICD-10-CM | POA: Diagnosis not present

## 2020-02-06 DIAGNOSIS — Z853 Personal history of malignant neoplasm of breast: Secondary | ICD-10-CM | POA: Diagnosis not present

## 2020-02-07 ENCOUNTER — Ambulatory Visit (INDEPENDENT_AMBULATORY_CARE_PROVIDER_SITE_OTHER): Payer: Medicare Other | Admitting: Cardiology

## 2020-02-07 ENCOUNTER — Other Ambulatory Visit: Payer: Self-pay

## 2020-02-07 ENCOUNTER — Encounter: Payer: Self-pay | Admitting: Cardiology

## 2020-02-07 VITALS — BP 130/80 | HR 88 | Ht 67.0 in | Wt 216.0 lb

## 2020-02-07 DIAGNOSIS — Z950 Presence of cardiac pacemaker: Secondary | ICD-10-CM

## 2020-02-07 DIAGNOSIS — I442 Atrioventricular block, complete: Secondary | ICD-10-CM | POA: Diagnosis not present

## 2020-02-07 DIAGNOSIS — I83893 Varicose veins of bilateral lower extremities with other complications: Secondary | ICD-10-CM

## 2020-02-07 DIAGNOSIS — I4892 Unspecified atrial flutter: Secondary | ICD-10-CM | POA: Diagnosis not present

## 2020-02-07 DIAGNOSIS — I639 Cerebral infarction, unspecified: Secondary | ICD-10-CM

## 2020-02-08 DIAGNOSIS — L97412 Non-pressure chronic ulcer of right heel and midfoot with fat layer exposed: Secondary | ICD-10-CM | POA: Diagnosis not present

## 2020-02-08 DIAGNOSIS — I951 Orthostatic hypotension: Secondary | ICD-10-CM | POA: Diagnosis not present

## 2020-02-08 DIAGNOSIS — I872 Venous insufficiency (chronic) (peripheral): Secondary | ICD-10-CM | POA: Diagnosis not present

## 2020-02-08 DIAGNOSIS — I2510 Atherosclerotic heart disease of native coronary artery without angina pectoris: Secondary | ICD-10-CM | POA: Diagnosis not present

## 2020-02-08 DIAGNOSIS — I5030 Unspecified diastolic (congestive) heart failure: Secondary | ICD-10-CM | POA: Diagnosis not present

## 2020-02-08 DIAGNOSIS — I442 Atrioventricular block, complete: Secondary | ICD-10-CM | POA: Diagnosis not present

## 2020-02-10 DIAGNOSIS — I951 Orthostatic hypotension: Secondary | ICD-10-CM | POA: Diagnosis not present

## 2020-02-10 DIAGNOSIS — I442 Atrioventricular block, complete: Secondary | ICD-10-CM | POA: Diagnosis not present

## 2020-02-10 DIAGNOSIS — L97412 Non-pressure chronic ulcer of right heel and midfoot with fat layer exposed: Secondary | ICD-10-CM | POA: Diagnosis not present

## 2020-02-10 DIAGNOSIS — I2510 Atherosclerotic heart disease of native coronary artery without angina pectoris: Secondary | ICD-10-CM | POA: Diagnosis not present

## 2020-02-10 DIAGNOSIS — I872 Venous insufficiency (chronic) (peripheral): Secondary | ICD-10-CM | POA: Diagnosis not present

## 2020-02-10 DIAGNOSIS — I5030 Unspecified diastolic (congestive) heart failure: Secondary | ICD-10-CM | POA: Diagnosis not present

## 2020-02-15 DIAGNOSIS — I5030 Unspecified diastolic (congestive) heart failure: Secondary | ICD-10-CM | POA: Diagnosis not present

## 2020-02-15 DIAGNOSIS — I951 Orthostatic hypotension: Secondary | ICD-10-CM | POA: Diagnosis not present

## 2020-02-15 DIAGNOSIS — L97412 Non-pressure chronic ulcer of right heel and midfoot with fat layer exposed: Secondary | ICD-10-CM | POA: Diagnosis not present

## 2020-02-15 DIAGNOSIS — I442 Atrioventricular block, complete: Secondary | ICD-10-CM | POA: Diagnosis not present

## 2020-02-15 DIAGNOSIS — I2510 Atherosclerotic heart disease of native coronary artery without angina pectoris: Secondary | ICD-10-CM | POA: Diagnosis not present

## 2020-02-15 DIAGNOSIS — I872 Venous insufficiency (chronic) (peripheral): Secondary | ICD-10-CM | POA: Diagnosis not present

## 2020-02-17 ENCOUNTER — Encounter: Payer: Self-pay | Admitting: Adult Health

## 2020-02-17 ENCOUNTER — Ambulatory Visit (INDEPENDENT_AMBULATORY_CARE_PROVIDER_SITE_OTHER): Payer: Medicare Other | Admitting: Adult Health

## 2020-02-17 VITALS — BP 153/91 | HR 89 | Ht 63.0 in | Wt 215.0 lb

## 2020-02-17 DIAGNOSIS — I483 Typical atrial flutter: Secondary | ICD-10-CM

## 2020-02-17 DIAGNOSIS — I5030 Unspecified diastolic (congestive) heart failure: Secondary | ICD-10-CM | POA: Diagnosis not present

## 2020-02-17 DIAGNOSIS — I639 Cerebral infarction, unspecified: Secondary | ICD-10-CM

## 2020-02-17 DIAGNOSIS — I2510 Atherosclerotic heart disease of native coronary artery without angina pectoris: Secondary | ICD-10-CM | POA: Diagnosis not present

## 2020-02-17 DIAGNOSIS — E785 Hyperlipidemia, unspecified: Secondary | ICD-10-CM | POA: Diagnosis not present

## 2020-02-17 DIAGNOSIS — I872 Venous insufficiency (chronic) (peripheral): Secondary | ICD-10-CM | POA: Diagnosis not present

## 2020-02-17 DIAGNOSIS — L97412 Non-pressure chronic ulcer of right heel and midfoot with fat layer exposed: Secondary | ICD-10-CM | POA: Diagnosis not present

## 2020-02-17 DIAGNOSIS — I442 Atrioventricular block, complete: Secondary | ICD-10-CM | POA: Diagnosis not present

## 2020-02-17 DIAGNOSIS — I951 Orthostatic hypotension: Secondary | ICD-10-CM | POA: Diagnosis not present

## 2020-02-17 NOTE — Progress Notes (Signed)
[facility] [address] [place]. [place] [address] [phone]       STROKE FOLLOW UP NOTE  [name] Date of Birth:  08/23/1927 Medical Record Number:  [id number]   Reason for Referral: stroke follow up    SUBJECTIVE:   CHIEF COMPLAINT:  Chief Complaint  Patient presents with  . Follow-up    rm 9  . Cerebrovascular Accident    HPI:   Today, 02/17/2020, [name] returns for stroke follow-up.  She has been doing well since prior visit denying residual deficits or new stroke/TIA symptoms.  Cardiac monitor showed evidence of atrial flutter with follow-up with cardiology but patient refuses anticoagulation and even self discontinued aspirin due to bleeding concerns.  Switch from atorvastatin to Crestor at prior visit due to patient reporting GI side effects but apparently recently discontinued Crestor due to unknown reasons.  Blood pressure today 153/91.  Monitors at home which has been stable.  No further concerns at this time.    History provided for reference purposes only Initial visit 10/25/2019 JM: [name] is being seen for hospital follow-up.  She has been doing well since discharge with residual gait impairment but does report ongoing improvement. She does report intermittent dizziness with position changes but she believes it is due to furosemide (history of orthostatic hypotension). She currently is working with [facility] therapy for gait improvement. Continues to ambulate with a cane and denies recent falls. Completed 3 weeks DAPT and continues on aspirin 81mg  daily alone without bleeding or bruising.  Continues on atorvastatin 10 mg daily without myalgias but is concerned regarding GI side effects.  Blood pressure today 136/89. She has completed cardiac monitor this morning and plans on sending back in.  No concerns at this time.  Stroke admission 09/21/2019 Ms.Nichole Masri Sherrillis a 86 y.o.femalewith history of thyroid disease,  orthostatic hypotension, hypercholesterolemia, delusional disorder, complete heart block status post pacer, chronic congestive heart failurewho presented on 09/21/2019 with confusion with L arm and L leg weakness.  Stroke work-up revealed aborted right MCA infarct s/p tPA and IR w/ TICI revascularization of right MCA M2 occlusion, infarct embolic secondary to unknown source.  Recommended 30-day cardiac event monitor outpatient to assess for atrial fibrillation as possible etiology.  Recommended DAPT for 3 weeks and aspirin alone.  Covid positive likely incidental finding as asymptomatic and received first dose of vaccination 3 weeks prior.  History of orthostatic hypotension and recommended continuation of midodrine.  LDL 89 and recommended atorvastatin 10 mg daily.  Other stroke risk factors include advanced age, obesity, history of incidental cerebral vermis hemorrhage vs cavernoma in 09/2018 (no MRI d/t pacer), history of CHF and heart block with pacemaker.  Other active problems include history of bilateral breast cancer, thyroid disease and delusional disorder.  Evaluated by therapies and discharged home with recommendation of home health PT/OT.  Stroke:Aborted R MCAinfarcts/p tPA and IR w/ TICI3 revascularization,embolic secondary tounknownsource  Code Stroke CT headhyperdense R M2. No hemorrhage.ASPECTS 10.   CTA head & neckR MCA M2 occlusion. B ICQ siphons dolichoectatic. B fetal PCAs. Mild proximal ICA atherosclerosis.  Cerebral angio proximal R M2 occlusion s/p mechanical thrombectomy w/ embotrap and aspiration. TICI3 revascularization   Post IR CTUnremarkable  CT no acute ICH, no stroke  2D EchoEF55-60%. No source of embolus  LDL89  HgbA1c6.1  No antithromboticprior to admission, add aspirin and plavix x 3 weeks then aspirin alone   Therapy recommendations:HH OT, [place] home  ROS:   14 system review of systems performed and  negative with exception of gait impairment  PMH:  Past Medical History:  Diagnosis Date  . Cataract   . Chronic congestive heart failure, unspecified heart failure type ([place]) 09/19/2019  . Complete heart block (HCC)    s/p PPM implant (MDT) by Dr Blanch Media.  Atrial lead could not be paced at time of the procedure.  She has chronic AV dysociation  . Delusional disorder ([place])   . Exogenous obesity   . Hypercholesterolemia   . Invasive ductal carcinoma of breast ([place]) 03/2007   BILATERAL BREASTS  . Orthostatic hypotension    treated with midodrine by Dr Rollene [name]  . PONV (postoperative nausea and vomiting)   . Thyroid disease   . Venous insufficiency     PSH:  Past Surgical History:  Procedure Laterality Date  . ABDOMINAL HYSTERECTOMY    . BREAST LUMPECTOMY Bilateral 2009  . CATARACT EXTRACTION     EYE SURGERY X 2  . CHOLECYSTECTOMY    . COLONOSCOPY N/A 02/16/2015   Procedure: COLONOSCOPY;  Surgeon: Wilford Corner, MD;  Location: [facility] ENDOSCOPY;  Service: Endoscopy;  Laterality: N/A;  . EP IMPLANTABLE DEVICE N/A 02/23/2015   pacemaker generator change (MDT Sensia SR) by Dr Rayann Heman   . ESOPHAGOGASTRODUODENOSCOPY N/A 02/16/2015   Procedure: ESOPHAGOGASTRODUODENOSCOPY (EGD);  Surgeon: Wilford Corner, MD;  Location: [facility] ENDOSCOPY;  Service: Endoscopy;  Laterality: N/A;  . IR CT HEAD LTD  09/21/2019  . IR PERCUTANEOUS ART THROMBECTOMY/INFUSION INTRACRANIAL INC DIAG ANGIO  09/21/2019  . PACEMAKER INSERTION     MDT implanted by Dr Blanch Media.  Atrial lead placement was unsuccessful.  She has chronic AV dysociation  . RADIOLOGY WITH ANESTHESIA N/A 09/21/2019   Procedure: IR WITH ANESTHESIA CODE STROKE;  Surgeon: Radiologist, Medication, MD;  Location: [place];  Service: Radiology;  Laterality: N/A;  . remote right radical nephrectomy  2009    Social History:  Social History   Socioeconomic History  . Marital status: Widowed    Spouse name: Not on file  . Number of children: Not on file    . Years of education: Not on file  . Highest education level: Not on file  Occupational History  . Not on file  Tobacco Use  . Smoking status: Never Smoker  . Smokeless tobacco: Never Used  Vaping Use  . Vaping Use: Never used  Substance and Sexual Activity  . Alcohol use: No    Alcohol/week: 0.0 standard drinks  . Drug use: No  . Sexual activity: Not Currently  Other Topics Concern  . Not on file  Social History Narrative  . Not on file   Social Determinants of Health   Financial Resource Strain:   . Difficulty of Paying Living Expenses: Not on file  Food Insecurity:   . Worried About Charity fundraiser in the Last Year: Not on file  . Ran Out of Food in the Last Year: Not on file  Transportation Needs:   . Lack of Transportation (Medical): Not on file  . Lack of Transportation (Non-Medical): Not on file  Physical Activity:   . Days of Exercise per Week: Not on file  . Minutes of Exercise per Session: Not on file  Stress:   . Feeling of Stress : Not on file  Social Connections:   . Frequency of Communication with Friends and Family: Not on file  . Frequency of Social Gatherings with Friends and Family: Not on file  . Attends  Religious Services: Not on file  . Active Member of Clubs or Organizations: Not on file  . Attends Archivist Meetings: Not on file  . Marital Status: Not on file  Intimate Partner Violence:   . Fear of Current or Ex-Partner: Not on file  . Emotionally Abused: Not on file  . Physically Abused: Not on file  . Sexually Abused: Not on file    Family History:  Family History  Problem Relation Age of Onset  . Heart disease Maternal Grandmother   . Heart disease Mother     Medications:   Current Outpatient Medications on File Prior to Visit  Medication Sig Dispense Refill  . Calcium Carbonate-Vitamin D (CALTRATE 600+D PO) Take 1 tablet by mouth daily after breakfast.     . fesoterodine (TOVIAZ) 4 MG TB24 tablet Take 1 tablet (4  mg total) by mouth daily. 90 tablet 3  . furosemide (LASIX) 20 MG tablet Take 0.5 tablets (10 mg total) by mouth daily. 45 tablet 2  . Lactobacillus Rhamnosus, GG, (CULTURELLE PO) Take 1 capsule by mouth every morning.    . latanoprost (XALATAN) 0.005 % ophthalmic solution Place 1 drop into both eyes at bedtime. 2.5 mL 10  . levothyroxine (SYNTHROID) 50 MCG tablet Take 1 tablet (50 mcg total) by mouth daily before breakfast. 90 tablet 2  . midodrine (PROAMATINE) 2.5 MG tablet Take 1 tablet (2.5 mg total) by mouth daily after breakfast. 90 tablet 2  . Potassium Chloride ER 20 MEQ TBCR Take 20 mEq by mouth daily. 90 tablet 3  . potassium chloride SA (KLOR-CON M20) 20 MEQ tablet Take 20 mEq by mouth 2 (two) times daily.     No current facility-administered medications on file prior to visit.    Allergies:   Allergies  Allergen Reactions  . Amoxicillin-Pot Clavulanate Diarrhea and Nausea And Vomiting    Has patient had a PCN reaction causing immediate rash, facial/tongue/throat swelling, SOB or lightheadedness with hypotension: Yes Has patient had a PCN reaction causing severe rash involving mucus membranes or skin necrosis: No Has patient had a PCN reaction that required hospitalization: No Has patient had a PCN reaction occurring within the last 10 years: Yes If all of the above answers are "NO", then may proceed with Cephalosporin use.   . Erythromycin Hives  . Tape Other (See Comments)    BAND AIDS-SKIN IRRITATION  . Codeine Nausea And Vomiting  . Doxycycline Diarrhea  . Flagyl [Metronidazole] Hives  . Macrobid [Nitrofurantoin Macrocrystal] Nausea And Vomiting  . Tolterodine Nausea And Vomiting  . Ciprofloxacin Hives and Rash      OBJECTIVE:  Physical Exam  Vitals:   02/17/20 1233  BP: (!) 153/91  Pulse: 89  Weight: 215 lb (97.5 kg)  Height: 5\' 3"  (1.6 m)   Body mass index is 38.09 kg/m. No exam data present  General: well developed, well nourished, very pleasant  elderly Caucasian female, seated, in no evident distress Head: head normocephalic and atraumatic.   Neck: supple with no carotid or supraclavicular bruits Cardiovascular: regular rate and rhythm, no murmurs Musculoskeletal: no deformity Skin:  no rash/petichiae Vascular:  Normal pulses all extremities   Neurologic Exam Mental Status: Awake and fully alert. Fluent speech and language. Oriented to place and time. Recent and remote memory intact. Attention span, concentration and fund of knowledge appropriate during visit. Mood and affect appropriate.  Cranial Nerves: Pupils equal, briskly reactive to light. Extraocular movements full without nystagmus. Visual fields full to confrontation. Hearing  intact. Facial sensation intact. Face, tongue, palate moves normally and symmetrically.  Motor: Normal bulk and tone. Normal strength in all tested extremity muscles. Sensory.: intact to touch , pinprick , position and vibratory sensation.  Coordination: Rapid alternating movements normal in all extremities. Finger-to-nose and heel-to-shin performed accurately bilaterally. Gait and Station: Arises from chair without difficulty. Stance is normal. Gait demonstrates  short shuffled steps with use of cane and mild imbalance Reflexes: 1+ and symmetric. Toes downgoing.       ASSESSMENT/PLAN:   [name] is a 86 y.o. year old female presented with confusion and left hemiparesis on 09/21/2019 with stroke work-up aborted right MCA infarct s/p TPA and IR with TICI 3 revascularization of right M2 occlusion, infarct embolic secondary to unknown source. Vascular risk factors include atrial flutter noted on cardiac monitor, HLD, advanced age, prior stroke history, CHF and heart block with pacer.       1. Right MCA stroke, cryptogenic:  a. Pt reports recovered well without residual deficits.  Continues to have gait impairment but per patient, she is at baseline b. Cardiac monitor noted atrial fibrillation  - followed up with cardiology and patient refusing anticoagulation or even antithrombotics at this point due to concern of bleeding.  Patient reports rectal bleeding with aspirin usage as well as history of GI bleed and ICH.  Discussed risk versus benefit with use of anticoagulation and patient understands potential risk of recurrent stroke without anticoagulation or antithrombotic usage and still declines use of either therapy c. Discussed secondary stroke prevention measures and importance of close PCP follow-up for aggressive stroke risk factor management. 2. Atrial flutter: Found recently on cardiac monitor.  Currently in regular rhythm and asymptomatic.  Continues to be monitored by cardiology.  Patient declines use of anticoagulation due to bleeding concerns with history of ICH and GI bleed 3. HLD: LDL goal<70.  Prior intolerance to atorvastatin and recently self discontinued Crestor -she declines interest in restarting any type of lipid or cholesterol medications.  She plans on further speaking with her PCP   Overall stable from stroke standpoint and per patient request, she may follow-up on an as-needed basis   I spent 25 minutes of face-to-face and non-face-to-face time with patient.  This included previsit chart review, lab review, study review, order entry, electronic health record documentation, patient education regarding recent stroke and recent diagnosis of atrial flutter found on cardiac monitor likely stroke etiology, risk versus benefit with use of AC, importance of managing stroke risk factors and answered all questions to patient satisfaction   Frann Rider, AGNP-BC  [facility] [address] [place], [place] [address]  Phone [phone] Fax [phone] Note: This document was prepared with digital dictation and possible smart phrase technology. Any transcriptional errors that result from this process are unintentional.

## 2020-02-18 NOTE — Progress Notes (Signed)
I agree with the above plan 

## 2020-02-22 DIAGNOSIS — I872 Venous insufficiency (chronic) (peripheral): Secondary | ICD-10-CM | POA: Diagnosis not present

## 2020-02-22 DIAGNOSIS — I2510 Atherosclerotic heart disease of native coronary artery without angina pectoris: Secondary | ICD-10-CM | POA: Diagnosis not present

## 2020-02-22 DIAGNOSIS — I442 Atrioventricular block, complete: Secondary | ICD-10-CM | POA: Diagnosis not present

## 2020-02-22 DIAGNOSIS — I951 Orthostatic hypotension: Secondary | ICD-10-CM | POA: Diagnosis not present

## 2020-02-22 DIAGNOSIS — L97412 Non-pressure chronic ulcer of right heel and midfoot with fat layer exposed: Secondary | ICD-10-CM | POA: Diagnosis not present

## 2020-02-22 DIAGNOSIS — I5030 Unspecified diastolic (congestive) heart failure: Secondary | ICD-10-CM | POA: Diagnosis not present

## 2020-02-24 DIAGNOSIS — L97412 Non-pressure chronic ulcer of right heel and midfoot with fat layer exposed: Secondary | ICD-10-CM | POA: Diagnosis not present

## 2020-02-24 DIAGNOSIS — I442 Atrioventricular block, complete: Secondary | ICD-10-CM | POA: Diagnosis not present

## 2020-02-24 DIAGNOSIS — I2510 Atherosclerotic heart disease of native coronary artery without angina pectoris: Secondary | ICD-10-CM | POA: Diagnosis not present

## 2020-02-24 DIAGNOSIS — I951 Orthostatic hypotension: Secondary | ICD-10-CM | POA: Diagnosis not present

## 2020-02-24 DIAGNOSIS — I872 Venous insufficiency (chronic) (peripheral): Secondary | ICD-10-CM | POA: Diagnosis not present

## 2020-02-24 DIAGNOSIS — I5030 Unspecified diastolic (congestive) heart failure: Secondary | ICD-10-CM | POA: Diagnosis not present

## 2020-02-29 DIAGNOSIS — H43813 Vitreous degeneration, bilateral: Secondary | ICD-10-CM | POA: Diagnosis not present

## 2020-02-29 DIAGNOSIS — Z23 Encounter for immunization: Secondary | ICD-10-CM | POA: Diagnosis not present

## 2020-02-29 DIAGNOSIS — I951 Orthostatic hypotension: Secondary | ICD-10-CM | POA: Diagnosis not present

## 2020-02-29 DIAGNOSIS — I2510 Atherosclerotic heart disease of native coronary artery without angina pectoris: Secondary | ICD-10-CM | POA: Diagnosis not present

## 2020-02-29 DIAGNOSIS — H401132 Primary open-angle glaucoma, bilateral, moderate stage: Secondary | ICD-10-CM | POA: Diagnosis not present

## 2020-02-29 DIAGNOSIS — I442 Atrioventricular block, complete: Secondary | ICD-10-CM | POA: Diagnosis not present

## 2020-02-29 DIAGNOSIS — Z961 Presence of intraocular lens: Secondary | ICD-10-CM | POA: Diagnosis not present

## 2020-02-29 DIAGNOSIS — L97412 Non-pressure chronic ulcer of right heel and midfoot with fat layer exposed: Secondary | ICD-10-CM | POA: Diagnosis not present

## 2020-02-29 DIAGNOSIS — I5030 Unspecified diastolic (congestive) heart failure: Secondary | ICD-10-CM | POA: Diagnosis not present

## 2020-02-29 DIAGNOSIS — I872 Venous insufficiency (chronic) (peripheral): Secondary | ICD-10-CM | POA: Diagnosis not present

## 2020-03-01 ENCOUNTER — Ambulatory Visit (INDEPENDENT_AMBULATORY_CARE_PROVIDER_SITE_OTHER): Payer: Medicare Other | Admitting: Podiatry

## 2020-03-01 ENCOUNTER — Other Ambulatory Visit: Payer: Self-pay

## 2020-03-01 DIAGNOSIS — I83015 Varicose veins of right lower extremity with ulcer other part of foot: Secondary | ICD-10-CM

## 2020-03-01 DIAGNOSIS — L97512 Non-pressure chronic ulcer of other part of right foot with fat layer exposed: Secondary | ICD-10-CM

## 2020-03-01 DIAGNOSIS — L97519 Non-pressure chronic ulcer of other part of right foot with unspecified severity: Secondary | ICD-10-CM

## 2020-03-01 NOTE — Progress Notes (Signed)
.     Subjective:  86 y.o. female presenting today for follow up evaluation of an ulceration of the right foot.  Patient has been doing well and she has been nurses coming out to her home 2 times per week for dressing changes.  She is very satisfied with the home health nurse agency  Past Medical History:  Diagnosis Date  . Cataract   . Chronic congestive heart failure, unspecified heart failure type ([place]) 09/19/2019  . Complete heart block (HCC)    s/p PPM implant (MDT) by Dr Blanch Media.  Atrial lead could not be paced at time of the procedure.  She has chronic AV dysociation  . Delusional disorder ([place])   . Exogenous obesity   . Hypercholesterolemia   . Invasive ductal carcinoma of breast ([place]) 03/2007   BILATERAL BREASTS  . Orthostatic hypotension    treated with midodrine by Dr Rollene [name]  . PONV (postoperative nausea and vomiting)   . Thyroid disease   . Venous insufficiency      Objective/Physical Exam General: The patient is alert and oriented x3 in no acute distress.  Dermatology:  Wound #1 noted to the right foot measuring 1.0x1.0 x 0.2 cm (LxWxD).  There continues to be improvement of the wound  To the noted ulceration(s), there is no eschar. There is a moderate amount of slough, fibrin, and necrotic tissue noted. Granulation tissue and wound base is red. There is a minimal amount of serosanguineous drainage noted. There is no exposed bone muscle-tendon ligament or joint. There is no malodor. Periwound integrity is intact. Erythema and edema noted to the right lower extremity.   Hyperkeratotic dystrophic thickened nails noted 1-5 bilateral  Vascular: Palpable pedal pulses bilaterally. Capillary refill within normal limits. Varicosities noted bilateral lower extremities.   Neurological: Epicritic and protective threshold diminished bilaterally.   Musculoskeletal Exam: Advanced degenerative changes noted to the bilateral feet with arch collapse and progressive DJD given the  patient's age.  Assessment: #1 ulceration of the right foot secondary to venous insufficiency #2 varicosities bilateral lower extremities   Plan of Care:  #1 Patient was evaluated.  #2 medically necessary excisional debridement including subcutaneous tissue was performed using a tissue nipper and a chisel blade. Excisional debridement of all the necrotic nonviable tissue down to healthy bleeding viable tissue was performed with post-debridement measurements same as pre-. #3 the wound was cleansed with normal saline. #4 Dry sterile dressing applied.   #5 Continue home health dressings at home 2 times per week.  #6  return to clinic in 6 weeks for follow-up   Edrick Kins, DPM [facility]  Dr. Edrick Kins, [place] [place] [address]                                        [place], [place] [address]                Office [phone]  Fax [phone]

## 2020-03-03 DIAGNOSIS — L97412 Non-pressure chronic ulcer of right heel and midfoot with fat layer exposed: Secondary | ICD-10-CM | POA: Diagnosis not present

## 2020-03-03 DIAGNOSIS — I872 Venous insufficiency (chronic) (peripheral): Secondary | ICD-10-CM | POA: Diagnosis not present

## 2020-03-03 DIAGNOSIS — I5030 Unspecified diastolic (congestive) heart failure: Secondary | ICD-10-CM | POA: Diagnosis not present

## 2020-03-03 DIAGNOSIS — I951 Orthostatic hypotension: Secondary | ICD-10-CM | POA: Diagnosis not present

## 2020-03-03 DIAGNOSIS — I2510 Atherosclerotic heart disease of native coronary artery without angina pectoris: Secondary | ICD-10-CM | POA: Diagnosis not present

## 2020-03-03 DIAGNOSIS — I442 Atrioventricular block, complete: Secondary | ICD-10-CM | POA: Diagnosis not present

## 2020-03-07 DIAGNOSIS — Z853 Personal history of malignant neoplasm of breast: Secondary | ICD-10-CM | POA: Diagnosis not present

## 2020-03-07 DIAGNOSIS — F419 Anxiety disorder, unspecified: Secondary | ICD-10-CM | POA: Diagnosis not present

## 2020-03-07 DIAGNOSIS — I872 Venous insufficiency (chronic) (peripheral): Secondary | ICD-10-CM | POA: Diagnosis not present

## 2020-03-07 DIAGNOSIS — Z8616 Personal history of COVID-19: Secondary | ICD-10-CM | POA: Diagnosis not present

## 2020-03-07 DIAGNOSIS — L97412 Non-pressure chronic ulcer of right heel and midfoot with fat layer exposed: Secondary | ICD-10-CM | POA: Diagnosis not present

## 2020-03-07 DIAGNOSIS — Z950 Presence of cardiac pacemaker: Secondary | ICD-10-CM | POA: Diagnosis not present

## 2020-03-07 DIAGNOSIS — I5030 Unspecified diastolic (congestive) heart failure: Secondary | ICD-10-CM | POA: Diagnosis not present

## 2020-03-07 DIAGNOSIS — I442 Atrioventricular block, complete: Secondary | ICD-10-CM | POA: Diagnosis not present

## 2020-03-07 DIAGNOSIS — M8580 Other specified disorders of bone density and structure, unspecified site: Secondary | ICD-10-CM | POA: Diagnosis not present

## 2020-03-07 DIAGNOSIS — Z8673 Personal history of transient ischemic attack (TIA), and cerebral infarction without residual deficits: Secondary | ICD-10-CM | POA: Diagnosis not present

## 2020-03-07 DIAGNOSIS — E039 Hypothyroidism, unspecified: Secondary | ICD-10-CM | POA: Diagnosis not present

## 2020-03-07 DIAGNOSIS — E785 Hyperlipidemia, unspecified: Secondary | ICD-10-CM | POA: Diagnosis not present

## 2020-03-07 DIAGNOSIS — I951 Orthostatic hypotension: Secondary | ICD-10-CM | POA: Diagnosis not present

## 2020-03-07 DIAGNOSIS — I2510 Atherosclerotic heart disease of native coronary artery without angina pectoris: Secondary | ICD-10-CM | POA: Diagnosis not present

## 2020-03-10 DIAGNOSIS — I872 Venous insufficiency (chronic) (peripheral): Secondary | ICD-10-CM | POA: Diagnosis not present

## 2020-03-10 DIAGNOSIS — I442 Atrioventricular block, complete: Secondary | ICD-10-CM | POA: Diagnosis not present

## 2020-03-10 DIAGNOSIS — L97412 Non-pressure chronic ulcer of right heel and midfoot with fat layer exposed: Secondary | ICD-10-CM | POA: Diagnosis not present

## 2020-03-10 DIAGNOSIS — I5030 Unspecified diastolic (congestive) heart failure: Secondary | ICD-10-CM | POA: Diagnosis not present

## 2020-03-10 DIAGNOSIS — I2510 Atherosclerotic heart disease of native coronary artery without angina pectoris: Secondary | ICD-10-CM | POA: Diagnosis not present

## 2020-03-10 DIAGNOSIS — I951 Orthostatic hypotension: Secondary | ICD-10-CM | POA: Diagnosis not present

## 2020-03-14 DIAGNOSIS — I951 Orthostatic hypotension: Secondary | ICD-10-CM | POA: Diagnosis not present

## 2020-03-14 DIAGNOSIS — L97412 Non-pressure chronic ulcer of right heel and midfoot with fat layer exposed: Secondary | ICD-10-CM | POA: Diagnosis not present

## 2020-03-14 DIAGNOSIS — I2510 Atherosclerotic heart disease of native coronary artery without angina pectoris: Secondary | ICD-10-CM | POA: Diagnosis not present

## 2020-03-14 DIAGNOSIS — I5030 Unspecified diastolic (congestive) heart failure: Secondary | ICD-10-CM | POA: Diagnosis not present

## 2020-03-14 DIAGNOSIS — I442 Atrioventricular block, complete: Secondary | ICD-10-CM | POA: Diagnosis not present

## 2020-03-14 DIAGNOSIS — I872 Venous insufficiency (chronic) (peripheral): Secondary | ICD-10-CM | POA: Diagnosis not present

## 2020-03-17 DIAGNOSIS — I872 Venous insufficiency (chronic) (peripheral): Secondary | ICD-10-CM | POA: Diagnosis not present

## 2020-03-17 DIAGNOSIS — I951 Orthostatic hypotension: Secondary | ICD-10-CM | POA: Diagnosis not present

## 2020-03-17 DIAGNOSIS — I5030 Unspecified diastolic (congestive) heart failure: Secondary | ICD-10-CM | POA: Diagnosis not present

## 2020-03-17 DIAGNOSIS — L97412 Non-pressure chronic ulcer of right heel and midfoot with fat layer exposed: Secondary | ICD-10-CM | POA: Diagnosis not present

## 2020-03-17 DIAGNOSIS — I2510 Atherosclerotic heart disease of native coronary artery without angina pectoris: Secondary | ICD-10-CM | POA: Diagnosis not present

## 2020-03-17 DIAGNOSIS — I442 Atrioventricular block, complete: Secondary | ICD-10-CM | POA: Diagnosis not present

## 2020-03-21 DIAGNOSIS — I442 Atrioventricular block, complete: Secondary | ICD-10-CM | POA: Diagnosis not present

## 2020-03-21 DIAGNOSIS — I2510 Atherosclerotic heart disease of native coronary artery without angina pectoris: Secondary | ICD-10-CM | POA: Diagnosis not present

## 2020-03-21 DIAGNOSIS — L97412 Non-pressure chronic ulcer of right heel and midfoot with fat layer exposed: Secondary | ICD-10-CM | POA: Diagnosis not present

## 2020-03-21 DIAGNOSIS — I872 Venous insufficiency (chronic) (peripheral): Secondary | ICD-10-CM | POA: Diagnosis not present

## 2020-03-21 DIAGNOSIS — I5030 Unspecified diastolic (congestive) heart failure: Secondary | ICD-10-CM | POA: Diagnosis not present

## 2020-03-21 DIAGNOSIS — I951 Orthostatic hypotension: Secondary | ICD-10-CM | POA: Diagnosis not present

## 2020-03-25 DIAGNOSIS — I951 Orthostatic hypotension: Secondary | ICD-10-CM | POA: Diagnosis not present

## 2020-03-25 DIAGNOSIS — I5030 Unspecified diastolic (congestive) heart failure: Secondary | ICD-10-CM | POA: Diagnosis not present

## 2020-03-25 DIAGNOSIS — I442 Atrioventricular block, complete: Secondary | ICD-10-CM | POA: Diagnosis not present

## 2020-03-25 DIAGNOSIS — I2510 Atherosclerotic heart disease of native coronary artery without angina pectoris: Secondary | ICD-10-CM | POA: Diagnosis not present

## 2020-03-25 DIAGNOSIS — I872 Venous insufficiency (chronic) (peripheral): Secondary | ICD-10-CM | POA: Diagnosis not present

## 2020-03-25 DIAGNOSIS — L97412 Non-pressure chronic ulcer of right heel and midfoot with fat layer exposed: Secondary | ICD-10-CM | POA: Diagnosis not present

## 2020-03-27 ENCOUNTER — Telehealth: Payer: Self-pay

## 2020-03-27 NOTE — Telephone Encounter (Signed)
Nurse Laurance Flatten RN called today from the [facility], wanted to report that the pt has what looks to be cellulitis on her left lower leg. The entire lower left leg is red warm to touch and is oozing. The nurse feels that the pt need an antibiotic called in or needs to come in to be evaluated. Please advise.

## 2020-03-29 DIAGNOSIS — I872 Venous insufficiency (chronic) (peripheral): Secondary | ICD-10-CM | POA: Diagnosis not present

## 2020-03-29 DIAGNOSIS — I951 Orthostatic hypotension: Secondary | ICD-10-CM | POA: Diagnosis not present

## 2020-03-29 DIAGNOSIS — I5030 Unspecified diastolic (congestive) heart failure: Secondary | ICD-10-CM | POA: Diagnosis not present

## 2020-03-29 DIAGNOSIS — I2510 Atherosclerotic heart disease of native coronary artery without angina pectoris: Secondary | ICD-10-CM | POA: Diagnosis not present

## 2020-03-29 DIAGNOSIS — I442 Atrioventricular block, complete: Secondary | ICD-10-CM | POA: Diagnosis not present

## 2020-03-29 DIAGNOSIS — L97412 Non-pressure chronic ulcer of right heel and midfoot with fat layer exposed: Secondary | ICD-10-CM | POA: Diagnosis not present

## 2020-03-31 ENCOUNTER — Other Ambulatory Visit: Payer: Self-pay | Admitting: Podiatry

## 2020-03-31 MED ORDER — CLINDAMYCIN HCL 300 MG PO CAPS
300.0000 mg | ORAL_CAPSULE | Freq: Three times a day (TID) | ORAL | 0 refills | Status: DC
Start: 2020-03-31 — End: 2020-12-04

## 2020-03-31 NOTE — Progress Notes (Signed)
PRN cellulitis

## 2020-04-04 DIAGNOSIS — I442 Atrioventricular block, complete: Secondary | ICD-10-CM | POA: Diagnosis not present

## 2020-04-04 DIAGNOSIS — I951 Orthostatic hypotension: Secondary | ICD-10-CM | POA: Diagnosis not present

## 2020-04-04 DIAGNOSIS — I2510 Atherosclerotic heart disease of native coronary artery without angina pectoris: Secondary | ICD-10-CM | POA: Diagnosis not present

## 2020-04-04 DIAGNOSIS — I5030 Unspecified diastolic (congestive) heart failure: Secondary | ICD-10-CM | POA: Diagnosis not present

## 2020-04-04 DIAGNOSIS — I872 Venous insufficiency (chronic) (peripheral): Secondary | ICD-10-CM | POA: Diagnosis not present

## 2020-04-04 DIAGNOSIS — L97412 Non-pressure chronic ulcer of right heel and midfoot with fat layer exposed: Secondary | ICD-10-CM | POA: Diagnosis not present

## 2020-04-06 DIAGNOSIS — I951 Orthostatic hypotension: Secondary | ICD-10-CM | POA: Diagnosis not present

## 2020-04-06 DIAGNOSIS — Z950 Presence of cardiac pacemaker: Secondary | ICD-10-CM | POA: Diagnosis not present

## 2020-04-06 DIAGNOSIS — M8580 Other specified disorders of bone density and structure, unspecified site: Secondary | ICD-10-CM | POA: Diagnosis not present

## 2020-04-06 DIAGNOSIS — Z8673 Personal history of transient ischemic attack (TIA), and cerebral infarction without residual deficits: Secondary | ICD-10-CM | POA: Diagnosis not present

## 2020-04-06 DIAGNOSIS — L97412 Non-pressure chronic ulcer of right heel and midfoot with fat layer exposed: Secondary | ICD-10-CM | POA: Diagnosis not present

## 2020-04-06 DIAGNOSIS — E785 Hyperlipidemia, unspecified: Secondary | ICD-10-CM | POA: Diagnosis not present

## 2020-04-06 DIAGNOSIS — Z853 Personal history of malignant neoplasm of breast: Secondary | ICD-10-CM | POA: Diagnosis not present

## 2020-04-06 DIAGNOSIS — E039 Hypothyroidism, unspecified: Secondary | ICD-10-CM | POA: Diagnosis not present

## 2020-04-06 DIAGNOSIS — F419 Anxiety disorder, unspecified: Secondary | ICD-10-CM | POA: Diagnosis not present

## 2020-04-06 DIAGNOSIS — I442 Atrioventricular block, complete: Secondary | ICD-10-CM | POA: Diagnosis not present

## 2020-04-06 DIAGNOSIS — I5030 Unspecified diastolic (congestive) heart failure: Secondary | ICD-10-CM | POA: Diagnosis not present

## 2020-04-06 DIAGNOSIS — I872 Venous insufficiency (chronic) (peripheral): Secondary | ICD-10-CM | POA: Diagnosis not present

## 2020-04-06 DIAGNOSIS — I2510 Atherosclerotic heart disease of native coronary artery without angina pectoris: Secondary | ICD-10-CM | POA: Diagnosis not present

## 2020-04-06 DIAGNOSIS — Z8616 Personal history of COVID-19: Secondary | ICD-10-CM | POA: Diagnosis not present

## 2020-04-07 DIAGNOSIS — I442 Atrioventricular block, complete: Secondary | ICD-10-CM | POA: Diagnosis not present

## 2020-04-07 DIAGNOSIS — I872 Venous insufficiency (chronic) (peripheral): Secondary | ICD-10-CM | POA: Diagnosis not present

## 2020-04-07 DIAGNOSIS — I5030 Unspecified diastolic (congestive) heart failure: Secondary | ICD-10-CM | POA: Diagnosis not present

## 2020-04-07 DIAGNOSIS — I951 Orthostatic hypotension: Secondary | ICD-10-CM | POA: Diagnosis not present

## 2020-04-07 DIAGNOSIS — L97412 Non-pressure chronic ulcer of right heel and midfoot with fat layer exposed: Secondary | ICD-10-CM | POA: Diagnosis not present

## 2020-04-07 DIAGNOSIS — I2510 Atherosclerotic heart disease of native coronary artery without angina pectoris: Secondary | ICD-10-CM | POA: Diagnosis not present

## 2020-04-11 DIAGNOSIS — L97412 Non-pressure chronic ulcer of right heel and midfoot with fat layer exposed: Secondary | ICD-10-CM | POA: Diagnosis not present

## 2020-04-11 DIAGNOSIS — I2510 Atherosclerotic heart disease of native coronary artery without angina pectoris: Secondary | ICD-10-CM | POA: Diagnosis not present

## 2020-04-11 DIAGNOSIS — I951 Orthostatic hypotension: Secondary | ICD-10-CM | POA: Diagnosis not present

## 2020-04-11 DIAGNOSIS — I442 Atrioventricular block, complete: Secondary | ICD-10-CM | POA: Diagnosis not present

## 2020-04-11 DIAGNOSIS — I872 Venous insufficiency (chronic) (peripheral): Secondary | ICD-10-CM | POA: Diagnosis not present

## 2020-04-11 DIAGNOSIS — I5030 Unspecified diastolic (congestive) heart failure: Secondary | ICD-10-CM | POA: Diagnosis not present

## 2020-04-14 DIAGNOSIS — I2510 Atherosclerotic heart disease of native coronary artery without angina pectoris: Secondary | ICD-10-CM | POA: Diagnosis not present

## 2020-04-14 DIAGNOSIS — I872 Venous insufficiency (chronic) (peripheral): Secondary | ICD-10-CM | POA: Diagnosis not present

## 2020-04-14 DIAGNOSIS — I951 Orthostatic hypotension: Secondary | ICD-10-CM | POA: Diagnosis not present

## 2020-04-14 DIAGNOSIS — I5030 Unspecified diastolic (congestive) heart failure: Secondary | ICD-10-CM | POA: Diagnosis not present

## 2020-04-14 DIAGNOSIS — L97412 Non-pressure chronic ulcer of right heel and midfoot with fat layer exposed: Secondary | ICD-10-CM | POA: Diagnosis not present

## 2020-04-14 DIAGNOSIS — I442 Atrioventricular block, complete: Secondary | ICD-10-CM | POA: Diagnosis not present

## 2020-04-17 ENCOUNTER — Ambulatory Visit (INDEPENDENT_AMBULATORY_CARE_PROVIDER_SITE_OTHER): Payer: Medicare Other | Admitting: Podiatry

## 2020-04-17 ENCOUNTER — Other Ambulatory Visit: Payer: Self-pay

## 2020-04-17 DIAGNOSIS — I83015 Varicose veins of right lower extremity with ulcer other part of foot: Secondary | ICD-10-CM

## 2020-04-17 DIAGNOSIS — L97512 Non-pressure chronic ulcer of other part of right foot with fat layer exposed: Secondary | ICD-10-CM | POA: Diagnosis not present

## 2020-04-17 DIAGNOSIS — L97519 Non-pressure chronic ulcer of other part of right foot with unspecified severity: Secondary | ICD-10-CM

## 2020-04-17 NOTE — Progress Notes (Signed)
.     Subjective:  86 y.o. female presenting today for follow up evaluation of an ulceration of the right foot.  Patient has been doing well and she has been nurses coming out to her home 2 times per week for dressing changes.  She is very satisfied with the home health nurse agency  Past Medical History:  Diagnosis Date  . Cataract   . Chronic congestive heart failure, unspecified heart failure type ([place]) 09/19/2019  . Complete heart block (HCC)    s/p PPM implant (MDT) by Dr Blanch Media.  Atrial lead could not be paced at time of the procedure.  She has chronic AV dysociation  . Delusional disorder ([place])   . Exogenous obesity   . Hypercholesterolemia   . Invasive ductal carcinoma of breast ([place]) 03/2007   BILATERAL BREASTS  . Orthostatic hypotension    treated with midodrine by Dr Rollene [name]  . PONV (postoperative nausea and vomiting)   . Thyroid disease   . Venous insufficiency      Objective/Physical Exam General: The patient is alert and oriented x3 in no acute distress.  Dermatology:  Wound #1 noted to the right foot measuring 1.0x1.0 x 0.2 cm (LxWxD).  There continues to be improvement of the wound  To the noted ulceration(s), there is no eschar. There is a moderate amount of slough, fibrin, and necrotic tissue noted. Granulation tissue and wound base is red. There is a minimal amount of serosanguineous drainage noted. There is no exposed bone muscle-tendon ligament or joint. There is no malodor. Periwound integrity is intact. Erythema and edema noted to the right lower extremity.   Hyperkeratotic dystrophic thickened nails noted 1-5 bilateral  Vascular: Palpable pedal pulses bilaterally. Capillary refill within normal limits. Varicosities noted bilateral lower extremities.   Neurological: Epicritic and protective threshold diminished bilaterally.   Musculoskeletal Exam: Advanced degenerative changes noted to the bilateral feet with arch collapse and progressive DJD given the  patient's age.  Assessment: #1 ulceration of the right foot secondary to venous insufficiency #2 varicosities bilateral lower extremities   Plan of Care:  #1 Patient was evaluated.  #2 medically necessary excisional debridement including subcutaneous tissue was performed using a tissue nipper and a chisel blade. Excisional debridement of all the necrotic nonviable tissue down to healthy bleeding viable tissue was performed with post-debridement measurements same as pre-. #3 the wound was cleansed with normal saline. #4 Dry sterile dressing applied.   #5 Continue home health dressings at home 2 times per week.  #6  return to clinic in 6 weeks for follow-up   Edrick Kins, DPM [facility]  Dr. Edrick Kins, [address] [place]                                        [place], [place] [address]                Office [phone]  Fax [phone]

## 2020-04-20 DIAGNOSIS — I442 Atrioventricular block, complete: Secondary | ICD-10-CM | POA: Diagnosis not present

## 2020-04-20 DIAGNOSIS — L97412 Non-pressure chronic ulcer of right heel and midfoot with fat layer exposed: Secondary | ICD-10-CM | POA: Diagnosis not present

## 2020-04-20 DIAGNOSIS — I872 Venous insufficiency (chronic) (peripheral): Secondary | ICD-10-CM | POA: Diagnosis not present

## 2020-04-20 DIAGNOSIS — I2510 Atherosclerotic heart disease of native coronary artery without angina pectoris: Secondary | ICD-10-CM | POA: Diagnosis not present

## 2020-04-20 DIAGNOSIS — I5030 Unspecified diastolic (congestive) heart failure: Secondary | ICD-10-CM | POA: Diagnosis not present

## 2020-04-20 DIAGNOSIS — I951 Orthostatic hypotension: Secondary | ICD-10-CM | POA: Diagnosis not present

## 2020-04-24 ENCOUNTER — Ambulatory Visit (INDEPENDENT_AMBULATORY_CARE_PROVIDER_SITE_OTHER): Payer: Medicare Other

## 2020-04-24 DIAGNOSIS — I442 Atrioventricular block, complete: Secondary | ICD-10-CM

## 2020-04-25 DIAGNOSIS — L97412 Non-pressure chronic ulcer of right heel and midfoot with fat layer exposed: Secondary | ICD-10-CM | POA: Diagnosis not present

## 2020-04-25 DIAGNOSIS — I5030 Unspecified diastolic (congestive) heart failure: Secondary | ICD-10-CM | POA: Diagnosis not present

## 2020-04-25 DIAGNOSIS — I872 Venous insufficiency (chronic) (peripheral): Secondary | ICD-10-CM | POA: Diagnosis not present

## 2020-04-25 DIAGNOSIS — I442 Atrioventricular block, complete: Secondary | ICD-10-CM | POA: Diagnosis not present

## 2020-04-25 DIAGNOSIS — I951 Orthostatic hypotension: Secondary | ICD-10-CM | POA: Diagnosis not present

## 2020-04-25 DIAGNOSIS — I2510 Atherosclerotic heart disease of native coronary artery without angina pectoris: Secondary | ICD-10-CM | POA: Diagnosis not present

## 2020-04-25 LAB — CUP PACEART REMOTE DEVICE CHECK
Battery Impedance: 2244 Ohm
Battery Remaining Longevity: 32 mo
Battery Voltage: 2.74 V
Brady Statistic RV Percent Paced: 99 %
Date Time Interrogation Session: 20211227070752
Implantable Lead Implant Date: 20141208
Implantable Lead Location: 753860
Implantable Lead Model: 4076
Implantable Pulse Generator Implant Date: 20161027
Lead Channel Impedance Value: 0 Ohm
Lead Channel Impedance Value: 375 Ohm
Lead Channel Pacing Threshold Amplitude: 1.125 V
Lead Channel Pacing Threshold Pulse Width: 0.4 ms
Lead Channel Setting Pacing Amplitude: 2.25 V
Lead Channel Setting Pacing Pulse Width: 0.4 ms
Lead Channel Setting Sensing Sensitivity: 2 mV

## 2020-04-27 DIAGNOSIS — I951 Orthostatic hypotension: Secondary | ICD-10-CM | POA: Diagnosis not present

## 2020-04-27 DIAGNOSIS — L97412 Non-pressure chronic ulcer of right heel and midfoot with fat layer exposed: Secondary | ICD-10-CM | POA: Diagnosis not present

## 2020-04-27 DIAGNOSIS — I872 Venous insufficiency (chronic) (peripheral): Secondary | ICD-10-CM | POA: Diagnosis not present

## 2020-04-27 DIAGNOSIS — I442 Atrioventricular block, complete: Secondary | ICD-10-CM | POA: Diagnosis not present

## 2020-04-27 DIAGNOSIS — I5030 Unspecified diastolic (congestive) heart failure: Secondary | ICD-10-CM | POA: Diagnosis not present

## 2020-04-27 DIAGNOSIS — I2510 Atherosclerotic heart disease of native coronary artery without angina pectoris: Secondary | ICD-10-CM | POA: Diagnosis not present

## 2020-05-03 DIAGNOSIS — I2510 Atherosclerotic heart disease of native coronary artery without angina pectoris: Secondary | ICD-10-CM | POA: Diagnosis not present

## 2020-05-03 DIAGNOSIS — I442 Atrioventricular block, complete: Secondary | ICD-10-CM | POA: Diagnosis not present

## 2020-05-03 DIAGNOSIS — L97412 Non-pressure chronic ulcer of right heel and midfoot with fat layer exposed: Secondary | ICD-10-CM | POA: Diagnosis not present

## 2020-05-03 DIAGNOSIS — I951 Orthostatic hypotension: Secondary | ICD-10-CM | POA: Diagnosis not present

## 2020-05-03 DIAGNOSIS — I872 Venous insufficiency (chronic) (peripheral): Secondary | ICD-10-CM | POA: Diagnosis not present

## 2020-05-03 DIAGNOSIS — I5030 Unspecified diastolic (congestive) heart failure: Secondary | ICD-10-CM | POA: Diagnosis not present

## 2020-05-04 ENCOUNTER — Telehealth: Payer: Self-pay | Admitting: Family Medicine

## 2020-05-04 NOTE — Telephone Encounter (Signed)
Tried call patient to  schedule Medicare Annual Wellness Visit (AWV) either virtually or in office.  No answer   Last AWV no information please schedule at anytime with [facility] Nurse Health Advisor 1 or 2   This should be a 45 minute visit.

## 2020-05-05 NOTE — Progress Notes (Signed)
Remote pacemaker transmission.   

## 2020-05-06 DIAGNOSIS — I872 Venous insufficiency (chronic) (peripheral): Secondary | ICD-10-CM | POA: Diagnosis not present

## 2020-05-06 DIAGNOSIS — F419 Anxiety disorder, unspecified: Secondary | ICD-10-CM | POA: Diagnosis not present

## 2020-05-06 DIAGNOSIS — I5030 Unspecified diastolic (congestive) heart failure: Secondary | ICD-10-CM | POA: Diagnosis not present

## 2020-05-06 DIAGNOSIS — I951 Orthostatic hypotension: Secondary | ICD-10-CM | POA: Diagnosis not present

## 2020-05-06 DIAGNOSIS — E039 Hypothyroidism, unspecified: Secondary | ICD-10-CM | POA: Diagnosis not present

## 2020-05-06 DIAGNOSIS — Z8673 Personal history of transient ischemic attack (TIA), and cerebral infarction without residual deficits: Secondary | ICD-10-CM | POA: Diagnosis not present

## 2020-05-06 DIAGNOSIS — Z853 Personal history of malignant neoplasm of breast: Secondary | ICD-10-CM | POA: Diagnosis not present

## 2020-05-06 DIAGNOSIS — I2510 Atherosclerotic heart disease of native coronary artery without angina pectoris: Secondary | ICD-10-CM | POA: Diagnosis not present

## 2020-05-06 DIAGNOSIS — I442 Atrioventricular block, complete: Secondary | ICD-10-CM | POA: Diagnosis not present

## 2020-05-06 DIAGNOSIS — L97412 Non-pressure chronic ulcer of right heel and midfoot with fat layer exposed: Secondary | ICD-10-CM | POA: Diagnosis not present

## 2020-05-06 DIAGNOSIS — Z8616 Personal history of COVID-19: Secondary | ICD-10-CM | POA: Diagnosis not present

## 2020-05-06 DIAGNOSIS — E785 Hyperlipidemia, unspecified: Secondary | ICD-10-CM | POA: Diagnosis not present

## 2020-05-06 DIAGNOSIS — Z950 Presence of cardiac pacemaker: Secondary | ICD-10-CM | POA: Diagnosis not present

## 2020-05-06 DIAGNOSIS — M8580 Other specified disorders of bone density and structure, unspecified site: Secondary | ICD-10-CM | POA: Diagnosis not present

## 2020-05-10 DIAGNOSIS — I951 Orthostatic hypotension: Secondary | ICD-10-CM | POA: Diagnosis not present

## 2020-05-10 DIAGNOSIS — I442 Atrioventricular block, complete: Secondary | ICD-10-CM | POA: Diagnosis not present

## 2020-05-10 DIAGNOSIS — I872 Venous insufficiency (chronic) (peripheral): Secondary | ICD-10-CM | POA: Diagnosis not present

## 2020-05-10 DIAGNOSIS — I5030 Unspecified diastolic (congestive) heart failure: Secondary | ICD-10-CM | POA: Diagnosis not present

## 2020-05-10 DIAGNOSIS — I2510 Atherosclerotic heart disease of native coronary artery without angina pectoris: Secondary | ICD-10-CM | POA: Diagnosis not present

## 2020-05-10 DIAGNOSIS — L97412 Non-pressure chronic ulcer of right heel and midfoot with fat layer exposed: Secondary | ICD-10-CM | POA: Diagnosis not present

## 2020-05-12 DIAGNOSIS — I5030 Unspecified diastolic (congestive) heart failure: Secondary | ICD-10-CM | POA: Diagnosis not present

## 2020-05-12 DIAGNOSIS — I442 Atrioventricular block, complete: Secondary | ICD-10-CM | POA: Diagnosis not present

## 2020-05-12 DIAGNOSIS — L97412 Non-pressure chronic ulcer of right heel and midfoot with fat layer exposed: Secondary | ICD-10-CM | POA: Diagnosis not present

## 2020-05-12 DIAGNOSIS — I951 Orthostatic hypotension: Secondary | ICD-10-CM | POA: Diagnosis not present

## 2020-05-12 DIAGNOSIS — I2510 Atherosclerotic heart disease of native coronary artery without angina pectoris: Secondary | ICD-10-CM | POA: Diagnosis not present

## 2020-05-12 DIAGNOSIS — I872 Venous insufficiency (chronic) (peripheral): Secondary | ICD-10-CM | POA: Diagnosis not present

## 2020-05-16 DIAGNOSIS — I442 Atrioventricular block, complete: Secondary | ICD-10-CM | POA: Diagnosis not present

## 2020-05-16 DIAGNOSIS — I5030 Unspecified diastolic (congestive) heart failure: Secondary | ICD-10-CM | POA: Diagnosis not present

## 2020-05-16 DIAGNOSIS — L97412 Non-pressure chronic ulcer of right heel and midfoot with fat layer exposed: Secondary | ICD-10-CM | POA: Diagnosis not present

## 2020-05-16 DIAGNOSIS — I872 Venous insufficiency (chronic) (peripheral): Secondary | ICD-10-CM | POA: Diagnosis not present

## 2020-05-16 DIAGNOSIS — I2510 Atherosclerotic heart disease of native coronary artery without angina pectoris: Secondary | ICD-10-CM | POA: Diagnosis not present

## 2020-05-16 DIAGNOSIS — I951 Orthostatic hypotension: Secondary | ICD-10-CM | POA: Diagnosis not present

## 2020-05-19 DIAGNOSIS — I2510 Atherosclerotic heart disease of native coronary artery without angina pectoris: Secondary | ICD-10-CM | POA: Diagnosis not present

## 2020-05-19 DIAGNOSIS — L97412 Non-pressure chronic ulcer of right heel and midfoot with fat layer exposed: Secondary | ICD-10-CM | POA: Diagnosis not present

## 2020-05-19 DIAGNOSIS — I5030 Unspecified diastolic (congestive) heart failure: Secondary | ICD-10-CM | POA: Diagnosis not present

## 2020-05-19 DIAGNOSIS — I442 Atrioventricular block, complete: Secondary | ICD-10-CM | POA: Diagnosis not present

## 2020-05-19 DIAGNOSIS — I872 Venous insufficiency (chronic) (peripheral): Secondary | ICD-10-CM | POA: Diagnosis not present

## 2020-05-19 DIAGNOSIS — I951 Orthostatic hypotension: Secondary | ICD-10-CM | POA: Diagnosis not present

## 2020-05-23 DIAGNOSIS — I2510 Atherosclerotic heart disease of native coronary artery without angina pectoris: Secondary | ICD-10-CM | POA: Diagnosis not present

## 2020-05-23 DIAGNOSIS — I5030 Unspecified diastolic (congestive) heart failure: Secondary | ICD-10-CM | POA: Diagnosis not present

## 2020-05-23 DIAGNOSIS — I951 Orthostatic hypotension: Secondary | ICD-10-CM | POA: Diagnosis not present

## 2020-05-23 DIAGNOSIS — I872 Venous insufficiency (chronic) (peripheral): Secondary | ICD-10-CM | POA: Diagnosis not present

## 2020-05-23 DIAGNOSIS — L97412 Non-pressure chronic ulcer of right heel and midfoot with fat layer exposed: Secondary | ICD-10-CM | POA: Diagnosis not present

## 2020-05-23 DIAGNOSIS — I442 Atrioventricular block, complete: Secondary | ICD-10-CM | POA: Diagnosis not present

## 2020-05-26 DIAGNOSIS — I442 Atrioventricular block, complete: Secondary | ICD-10-CM | POA: Diagnosis not present

## 2020-05-26 DIAGNOSIS — L97412 Non-pressure chronic ulcer of right heel and midfoot with fat layer exposed: Secondary | ICD-10-CM | POA: Diagnosis not present

## 2020-05-26 DIAGNOSIS — I2510 Atherosclerotic heart disease of native coronary artery without angina pectoris: Secondary | ICD-10-CM | POA: Diagnosis not present

## 2020-05-26 DIAGNOSIS — I5030 Unspecified diastolic (congestive) heart failure: Secondary | ICD-10-CM | POA: Diagnosis not present

## 2020-05-26 DIAGNOSIS — I951 Orthostatic hypotension: Secondary | ICD-10-CM | POA: Diagnosis not present

## 2020-05-26 DIAGNOSIS — I872 Venous insufficiency (chronic) (peripheral): Secondary | ICD-10-CM | POA: Diagnosis not present

## 2020-05-29 ENCOUNTER — Ambulatory Visit (INDEPENDENT_AMBULATORY_CARE_PROVIDER_SITE_OTHER): Payer: Medicare Other | Admitting: Podiatry

## 2020-05-29 ENCOUNTER — Other Ambulatory Visit: Payer: Self-pay

## 2020-05-29 DIAGNOSIS — L97512 Non-pressure chronic ulcer of other part of right foot with fat layer exposed: Secondary | ICD-10-CM | POA: Diagnosis not present

## 2020-05-29 DIAGNOSIS — L97519 Non-pressure chronic ulcer of other part of right foot with unspecified severity: Secondary | ICD-10-CM

## 2020-05-29 DIAGNOSIS — I83015 Varicose veins of right lower extremity with ulcer other part of foot: Secondary | ICD-10-CM

## 2020-05-29 NOTE — Progress Notes (Signed)
.     Subjective:  87 y.o. female presenting today for follow up evaluation of an ulceration of the right foot.  Patient has been doing well and she has been nurses coming out to her home 2 times per week for dressing changes.  She is very satisfied with the home health nurse agency  Past Medical History:  Diagnosis Date  . Cataract   . Chronic congestive heart failure, unspecified heart failure type ([place]) 09/19/2019  . Complete heart block (HCC)    s/p PPM implant (MDT) by Dr Blanch Media.  Atrial lead could not be paced at time of the procedure.  She has chronic AV dysociation  . Delusional disorder ([place])   . Exogenous obesity   . Hypercholesterolemia   . Invasive ductal carcinoma of breast ([place]) 03/2007   BILATERAL BREASTS  . Orthostatic hypotension    treated with midodrine by Dr Rollene [name]  . PONV (postoperative nausea and vomiting)   . Thyroid disease   . Venous insufficiency      Objective/Physical Exam General: The patient is alert and oriented x3 in no acute distress.  Dermatology:  Wound #1 noted to the right foot measuring 1.0x1.0 x 0.2 cm (LxWxD).  There continues to be improvement of the wound  To the noted ulceration(s), there is no eschar. There is a moderate amount of slough, fibrin, and necrotic tissue noted. Granulation tissue and wound base is red. There is a minimal amount of serosanguineous drainage noted. There is no exposed bone muscle-tendon ligament or joint. There is no malodor. Periwound integrity is intact. Erythema and edema noted to the right lower extremity.   Hyperkeratotic dystrophic thickened nails noted 1-5 bilateral  Vascular: Palpable pedal pulses bilaterally. Capillary refill within normal limits. Varicosities noted bilateral lower extremities.   Neurological: Epicritic and protective threshold diminished bilaterally.   Musculoskeletal Exam: Advanced degenerative changes noted to the bilateral feet with arch collapse and progressive DJD given the  patient's age.  Assessment: #1 ulceration of the right foot secondary to venous insufficiency #2 varicosities bilateral lower extremities   Plan of Care:  #1 Patient was evaluated.  #2 medically necessary excisional debridement including subcutaneous tissue was performed using a tissue nipper and a chisel blade. Excisional debridement of all the necrotic nonviable tissue down to healthy bleeding viable tissue was performed with post-debridement measurements same as pre-. #3 the wound was cleansed with normal saline. #4 Dry sterile dressing applied.   #5 Continue home health dressings at home 2 times per week.  #6  return to clinic in 8 weeks for follow-up   Edrick Kins, DPM [facility]  Dr. Edrick Kins, [address]                                        [place], [place] [address]                Office [phone]  Fax [phone]

## 2020-06-02 DIAGNOSIS — L97412 Non-pressure chronic ulcer of right heel and midfoot with fat layer exposed: Secondary | ICD-10-CM | POA: Diagnosis not present

## 2020-06-02 DIAGNOSIS — I951 Orthostatic hypotension: Secondary | ICD-10-CM | POA: Diagnosis not present

## 2020-06-02 DIAGNOSIS — I872 Venous insufficiency (chronic) (peripheral): Secondary | ICD-10-CM | POA: Diagnosis not present

## 2020-06-02 DIAGNOSIS — I442 Atrioventricular block, complete: Secondary | ICD-10-CM | POA: Diagnosis not present

## 2020-06-02 DIAGNOSIS — I2510 Atherosclerotic heart disease of native coronary artery without angina pectoris: Secondary | ICD-10-CM | POA: Diagnosis not present

## 2020-06-02 DIAGNOSIS — I5030 Unspecified diastolic (congestive) heart failure: Secondary | ICD-10-CM | POA: Diagnosis not present

## 2020-06-05 DIAGNOSIS — I442 Atrioventricular block, complete: Secondary | ICD-10-CM | POA: Diagnosis not present

## 2020-06-05 DIAGNOSIS — Z8673 Personal history of transient ischemic attack (TIA), and cerebral infarction without residual deficits: Secondary | ICD-10-CM | POA: Diagnosis not present

## 2020-06-05 DIAGNOSIS — Z8616 Personal history of COVID-19: Secondary | ICD-10-CM | POA: Diagnosis not present

## 2020-06-05 DIAGNOSIS — Z853 Personal history of malignant neoplasm of breast: Secondary | ICD-10-CM | POA: Diagnosis not present

## 2020-06-05 DIAGNOSIS — M8580 Other specified disorders of bone density and structure, unspecified site: Secondary | ICD-10-CM | POA: Diagnosis not present

## 2020-06-05 DIAGNOSIS — I2510 Atherosclerotic heart disease of native coronary artery without angina pectoris: Secondary | ICD-10-CM | POA: Diagnosis not present

## 2020-06-05 DIAGNOSIS — F419 Anxiety disorder, unspecified: Secondary | ICD-10-CM | POA: Diagnosis not present

## 2020-06-05 DIAGNOSIS — E039 Hypothyroidism, unspecified: Secondary | ICD-10-CM | POA: Diagnosis not present

## 2020-06-05 DIAGNOSIS — I951 Orthostatic hypotension: Secondary | ICD-10-CM | POA: Diagnosis not present

## 2020-06-05 DIAGNOSIS — I872 Venous insufficiency (chronic) (peripheral): Secondary | ICD-10-CM | POA: Diagnosis not present

## 2020-06-05 DIAGNOSIS — Z950 Presence of cardiac pacemaker: Secondary | ICD-10-CM | POA: Diagnosis not present

## 2020-06-05 DIAGNOSIS — E785 Hyperlipidemia, unspecified: Secondary | ICD-10-CM | POA: Diagnosis not present

## 2020-06-05 DIAGNOSIS — L97412 Non-pressure chronic ulcer of right heel and midfoot with fat layer exposed: Secondary | ICD-10-CM | POA: Diagnosis not present

## 2020-06-05 DIAGNOSIS — I5030 Unspecified diastolic (congestive) heart failure: Secondary | ICD-10-CM | POA: Diagnosis not present

## 2020-06-06 DIAGNOSIS — I872 Venous insufficiency (chronic) (peripheral): Secondary | ICD-10-CM | POA: Diagnosis not present

## 2020-06-06 DIAGNOSIS — I442 Atrioventricular block, complete: Secondary | ICD-10-CM | POA: Diagnosis not present

## 2020-06-06 DIAGNOSIS — L97412 Non-pressure chronic ulcer of right heel and midfoot with fat layer exposed: Secondary | ICD-10-CM | POA: Diagnosis not present

## 2020-06-06 DIAGNOSIS — I5030 Unspecified diastolic (congestive) heart failure: Secondary | ICD-10-CM | POA: Diagnosis not present

## 2020-06-06 DIAGNOSIS — I2510 Atherosclerotic heart disease of native coronary artery without angina pectoris: Secondary | ICD-10-CM | POA: Diagnosis not present

## 2020-06-06 DIAGNOSIS — I951 Orthostatic hypotension: Secondary | ICD-10-CM | POA: Diagnosis not present

## 2020-06-08 DIAGNOSIS — L97412 Non-pressure chronic ulcer of right heel and midfoot with fat layer exposed: Secondary | ICD-10-CM | POA: Diagnosis not present

## 2020-06-08 DIAGNOSIS — I2510 Atherosclerotic heart disease of native coronary artery without angina pectoris: Secondary | ICD-10-CM | POA: Diagnosis not present

## 2020-06-08 DIAGNOSIS — I872 Venous insufficiency (chronic) (peripheral): Secondary | ICD-10-CM | POA: Diagnosis not present

## 2020-06-08 DIAGNOSIS — I5030 Unspecified diastolic (congestive) heart failure: Secondary | ICD-10-CM | POA: Diagnosis not present

## 2020-06-08 DIAGNOSIS — I951 Orthostatic hypotension: Secondary | ICD-10-CM | POA: Diagnosis not present

## 2020-06-08 DIAGNOSIS — I442 Atrioventricular block, complete: Secondary | ICD-10-CM | POA: Diagnosis not present

## 2020-06-13 DIAGNOSIS — I442 Atrioventricular block, complete: Secondary | ICD-10-CM | POA: Diagnosis not present

## 2020-06-13 DIAGNOSIS — I951 Orthostatic hypotension: Secondary | ICD-10-CM | POA: Diagnosis not present

## 2020-06-13 DIAGNOSIS — L97412 Non-pressure chronic ulcer of right heel and midfoot with fat layer exposed: Secondary | ICD-10-CM | POA: Diagnosis not present

## 2020-06-13 DIAGNOSIS — I5030 Unspecified diastolic (congestive) heart failure: Secondary | ICD-10-CM | POA: Diagnosis not present

## 2020-06-13 DIAGNOSIS — I2510 Atherosclerotic heart disease of native coronary artery without angina pectoris: Secondary | ICD-10-CM | POA: Diagnosis not present

## 2020-06-13 DIAGNOSIS — I872 Venous insufficiency (chronic) (peripheral): Secondary | ICD-10-CM | POA: Diagnosis not present

## 2020-06-15 DIAGNOSIS — L97412 Non-pressure chronic ulcer of right heel and midfoot with fat layer exposed: Secondary | ICD-10-CM | POA: Diagnosis not present

## 2020-06-15 DIAGNOSIS — I5030 Unspecified diastolic (congestive) heart failure: Secondary | ICD-10-CM | POA: Diagnosis not present

## 2020-06-15 DIAGNOSIS — I442 Atrioventricular block, complete: Secondary | ICD-10-CM | POA: Diagnosis not present

## 2020-06-15 DIAGNOSIS — I2510 Atherosclerotic heart disease of native coronary artery without angina pectoris: Secondary | ICD-10-CM | POA: Diagnosis not present

## 2020-06-15 DIAGNOSIS — I872 Venous insufficiency (chronic) (peripheral): Secondary | ICD-10-CM | POA: Diagnosis not present

## 2020-06-15 DIAGNOSIS — I951 Orthostatic hypotension: Secondary | ICD-10-CM | POA: Diagnosis not present

## 2020-06-20 DIAGNOSIS — I951 Orthostatic hypotension: Secondary | ICD-10-CM | POA: Diagnosis not present

## 2020-06-20 DIAGNOSIS — L97412 Non-pressure chronic ulcer of right heel and midfoot with fat layer exposed: Secondary | ICD-10-CM | POA: Diagnosis not present

## 2020-06-20 DIAGNOSIS — I2510 Atherosclerotic heart disease of native coronary artery without angina pectoris: Secondary | ICD-10-CM | POA: Diagnosis not present

## 2020-06-20 DIAGNOSIS — I872 Venous insufficiency (chronic) (peripheral): Secondary | ICD-10-CM | POA: Diagnosis not present

## 2020-06-20 DIAGNOSIS — I5030 Unspecified diastolic (congestive) heart failure: Secondary | ICD-10-CM | POA: Diagnosis not present

## 2020-06-20 DIAGNOSIS — I442 Atrioventricular block, complete: Secondary | ICD-10-CM | POA: Diagnosis not present

## 2020-06-22 DIAGNOSIS — I2510 Atherosclerotic heart disease of native coronary artery without angina pectoris: Secondary | ICD-10-CM | POA: Diagnosis not present

## 2020-06-22 DIAGNOSIS — I872 Venous insufficiency (chronic) (peripheral): Secondary | ICD-10-CM | POA: Diagnosis not present

## 2020-06-22 DIAGNOSIS — I5030 Unspecified diastolic (congestive) heart failure: Secondary | ICD-10-CM | POA: Diagnosis not present

## 2020-06-22 DIAGNOSIS — I951 Orthostatic hypotension: Secondary | ICD-10-CM | POA: Diagnosis not present

## 2020-06-22 DIAGNOSIS — I442 Atrioventricular block, complete: Secondary | ICD-10-CM | POA: Diagnosis not present

## 2020-06-22 DIAGNOSIS — L97412 Non-pressure chronic ulcer of right heel and midfoot with fat layer exposed: Secondary | ICD-10-CM | POA: Diagnosis not present

## 2020-06-28 ENCOUNTER — Telehealth: Payer: Self-pay | Admitting: *Deleted

## 2020-06-28 DIAGNOSIS — I5030 Unspecified diastolic (congestive) heart failure: Secondary | ICD-10-CM | POA: Diagnosis not present

## 2020-06-28 DIAGNOSIS — I951 Orthostatic hypotension: Secondary | ICD-10-CM | POA: Diagnosis not present

## 2020-06-28 DIAGNOSIS — I2510 Atherosclerotic heart disease of native coronary artery without angina pectoris: Secondary | ICD-10-CM | POA: Diagnosis not present

## 2020-06-28 DIAGNOSIS — I872 Venous insufficiency (chronic) (peripheral): Secondary | ICD-10-CM | POA: Diagnosis not present

## 2020-06-28 DIAGNOSIS — I442 Atrioventricular block, complete: Secondary | ICD-10-CM | POA: Diagnosis not present

## 2020-06-28 DIAGNOSIS — L97412 Non-pressure chronic ulcer of right heel and midfoot with fat layer exposed: Secondary | ICD-10-CM | POA: Diagnosis not present

## 2020-06-28 NOTE — Telephone Encounter (Signed)
Crystal w/ [facility] home [facility] is requesting a plan of care order(#[id number]) for patient. Please fax information to [phone]. Any questions please SFSE:[phone].

## 2020-07-04 DIAGNOSIS — L97412 Non-pressure chronic ulcer of right heel and midfoot with fat layer exposed: Secondary | ICD-10-CM | POA: Diagnosis not present

## 2020-07-04 DIAGNOSIS — H43813 Vitreous degeneration, bilateral: Secondary | ICD-10-CM | POA: Diagnosis not present

## 2020-07-04 DIAGNOSIS — I2510 Atherosclerotic heart disease of native coronary artery without angina pectoris: Secondary | ICD-10-CM | POA: Diagnosis not present

## 2020-07-04 DIAGNOSIS — H401132 Primary open-angle glaucoma, bilateral, moderate stage: Secondary | ICD-10-CM | POA: Diagnosis not present

## 2020-07-04 DIAGNOSIS — I5030 Unspecified diastolic (congestive) heart failure: Secondary | ICD-10-CM | POA: Diagnosis not present

## 2020-07-04 DIAGNOSIS — I951 Orthostatic hypotension: Secondary | ICD-10-CM | POA: Diagnosis not present

## 2020-07-04 DIAGNOSIS — I872 Venous insufficiency (chronic) (peripheral): Secondary | ICD-10-CM | POA: Diagnosis not present

## 2020-07-04 DIAGNOSIS — Z961 Presence of intraocular lens: Secondary | ICD-10-CM | POA: Diagnosis not present

## 2020-07-04 DIAGNOSIS — I442 Atrioventricular block, complete: Secondary | ICD-10-CM | POA: Diagnosis not present

## 2020-07-11 ENCOUNTER — Telehealth: Payer: Self-pay | Admitting: Family Medicine

## 2020-07-11 NOTE — Telephone Encounter (Signed)
Tried calling patient to  schedule Medicare Annual Wellness Visit (AWV) either virtually or in office. No answer  Last AWV no information  please schedule at anytime with [facility] Nurse Health Advisor 1 or 2   This should be a 45 minute visit.

## 2020-07-24 ENCOUNTER — Ambulatory Visit (INDEPENDENT_AMBULATORY_CARE_PROVIDER_SITE_OTHER): Payer: Medicare Other

## 2020-07-24 ENCOUNTER — Ambulatory Visit (INDEPENDENT_AMBULATORY_CARE_PROVIDER_SITE_OTHER): Payer: Medicare Other | Admitting: Podiatry

## 2020-07-24 ENCOUNTER — Other Ambulatory Visit: Payer: Self-pay

## 2020-07-24 DIAGNOSIS — I442 Atrioventricular block, complete: Secondary | ICD-10-CM

## 2020-07-24 DIAGNOSIS — L97512 Non-pressure chronic ulcer of other part of right foot with fat layer exposed: Secondary | ICD-10-CM | POA: Diagnosis not present

## 2020-07-24 DIAGNOSIS — I83015 Varicose veins of right lower extremity with ulcer other part of foot: Secondary | ICD-10-CM

## 2020-07-24 DIAGNOSIS — L989 Disorder of the skin and subcutaneous tissue, unspecified: Secondary | ICD-10-CM

## 2020-07-24 DIAGNOSIS — L97519 Non-pressure chronic ulcer of other part of right foot with unspecified severity: Secondary | ICD-10-CM

## 2020-07-24 LAB — CUP PACEART REMOTE DEVICE CHECK
Battery Impedance: 2440 Ohm
Battery Remaining Longevity: 29 mo
Battery Voltage: 2.74 V
Brady Statistic RV Percent Paced: 99 %
Date Time Interrogation Session: 20220328130033
Implantable Lead Implant Date: 20141208
Implantable Lead Location: 753860
Implantable Lead Model: 4076
Implantable Pulse Generator Implant Date: 20161027
Lead Channel Impedance Value: 0 Ohm
Lead Channel Impedance Value: 373 Ohm
Lead Channel Pacing Threshold Amplitude: 1.125 V
Lead Channel Pacing Threshold Pulse Width: 0.4 ms
Lead Channel Setting Pacing Amplitude: 2.25 V
Lead Channel Setting Pacing Pulse Width: 0.4 ms
Lead Channel Setting Sensing Sensitivity: 2 mV

## 2020-07-24 NOTE — Progress Notes (Signed)
.     Subjective:  87 y.o. female presenting today for follow up evaluation of an ulceration of the right foot.  Patient has been doing well and she has been nurses coming out to her home 2 times per week for dressing changes.  She is very satisfied with the home health nurse agency  Past Medical History:  Diagnosis Date  . Cataract   . Chronic congestive heart failure, unspecified heart failure type ([place]) 09/19/2019  . Complete heart block (HCC)    s/p PPM implant (MDT) by Dr Blanch Media.  Atrial lead could not be paced at time of the procedure.  She has chronic AV dysociation  . Delusional disorder ([place])   . Exogenous obesity   . Hypercholesterolemia   . Invasive ductal carcinoma of breast ([place]) 03/2007   BILATERAL BREASTS  . Orthostatic hypotension    treated with midodrine by Dr Rollene [name]  . PONV (postoperative nausea and vomiting)   . Thyroid disease   . Venous insufficiency      Objective/Physical Exam General: The patient is alert and oriented x3 in no acute distress.  Dermatology:  Wound #1 noted to the right foot measuring 0.1 x 0.1 x 0.1 cm (LxWxD).  There continues to be improvement of the wound  To the noted ulceration(s), there is no eschar. There is a moderate amount of slough, fibrin, and necrotic tissue noted. Granulation tissue and wound base is red. There is a minimal amount of serosanguineous drainage noted. There is no exposed bone muscle-tendon ligament or joint. There is no malodor. Periwound integrity is intact. Erythema and edema noted to the right lower extremity.   Hyperkeratotic dystrophic thickened nails noted 1-5 bilateral  Vascular: Palpable pedal pulses bilaterally. Capillary refill within normal limits. Varicosities noted bilateral lower extremities.   Neurological: Epicritic and protective threshold diminished bilaterally.   Musculoskeletal Exam: Advanced degenerative changes noted to the bilateral feet with arch collapse and progressive DJD given the  patient's age.  Assessment: #1 ulceration of the right foot secondary to venous insufficiency #2 varicosities bilateral lower extremities   Plan of Care:  #1 Patient was evaluated.  #2 medically necessary excisional debridement including subcutaneous tissue was performed using a tissue nipper and a chisel blade. Excisional debridement of all the necrotic nonviable tissue down to healthy bleeding viable tissue was performed with post-debridement measurements same as pre-.  Wound is mostly healed today. #3 the wound was cleansed with normal saline. #4 Dry sterile dressing applied.   #5 Continue home health dressings at home 2 times per week.  #6  Return to clinic in 3 months for routine foot care  Edrick Kins, DPM [facility]  Dr. Edrick Kins, DPM    [address], [place] [address]                Office [phone]  Fax [phone]

## 2020-08-04 NOTE — Progress Notes (Deleted)
[name] Date of Birth: 08-15-1927 Medical Record #[id number]  History of Present Illness: [name] is seen today for follow up of complete heart block.  She has a history of complete heart block with 12 second pause and underwent permanent pacemaker implant on 10/09/2007 with a [employer] adapta VVI pacemaker. She had revision of her pacemaker on February 23, 2015. She was admitted at that time with a GI bleed felt to be diverticular. No recurrence.  She also has a history of orthostatic dizziness managed with midodrine. She has no history of coronary disease or congestive heart failure. Last pacemaker check in December 2019 was normal.  She was admitted in June 2018 with acute diverticulitis. Treated with hydration and antibiotics. Admitted in November and December 2018 with hematochezia. Felt to be diverticular bleed. Conservative therapy recommended. ASA discontinued.   She has a history of chronic nonhealing wound on her right leg. This is being treated by podiatry. She was seen in September in the ED with complaints of asymmetric leg swelling. Doppler negative for DVT.  Of note in June 2020 she was admitted with delusional ideation.  She presented without any focal neurological symptoms or deficits but due to symptoms of delusion hallucinations had a CT scan done which showed a tiny incidental 8 mm midline cerebellar hemorrhage exact etiology unclear but unlikely to be hypertensive.  Possibly cavernous angioma. Repeat follow up CT showed resolution of hemorrhage.   She was  admitted 5/25-5/27/21 with acute right MCA CVA. She was treated emergently with TPA and mechanical thrombectomy with aborted stroke. Discharged on ASA and plavix for 3 weeks then ASA only. Started on statin. Echo was unremarkable. 30 day event monitor was scheduled. As recorded on 7/9 this showed atrial flutter with variable block rate 60. On follow up patient refused to take any anticoagulant or antiplatelet  therapy due to concerns  About bleeding. Last pacer check in March was OK.  On follow up she is doing well. Denies any chest pain, dyspnea, palpitations. No recurrent TIA or CVA symptoms.     Current Outpatient Medications on File Prior to Visit  Medication Sig Dispense Refill  . Calcium Carbonate-Vitamin D (CALTRATE 600+D PO) Take 1 tablet by mouth daily after breakfast.     . clindamycin (CLEOCIN) 300 MG capsule Take 1 capsule (300 mg total) by mouth 3 (three) times daily. 30 capsule 0  . fesoterodine (TOVIAZ) 4 MG TB24 tablet Take 1 tablet (4 mg total) by mouth daily. 90 tablet 3  . furosemide (LASIX) 20 MG tablet Take 0.5 tablets (10 mg total) by mouth daily. 45 tablet 2  . Lactobacillus Rhamnosus, GG, (CULTURELLE PO) Take 1 capsule by mouth every morning.    . latanoprost (XALATAN) 0.005 % ophthalmic solution Place 1 drop into both eyes at bedtime. 2.5 mL 10  . levothyroxine (SYNTHROID) 50 MCG tablet Take 1 tablet (50 mcg total) by mouth daily before breakfast. 90 tablet 2  . midodrine (PROAMATINE) 2.5 MG tablet Take 1 tablet (2.5 mg total) by mouth daily after breakfast. 90 tablet 2  . Potassium Chloride ER 20 MEQ TBCR Take 20 mEq by mouth daily. 90 tablet 3  . potassium chloride SA (KLOR-CON M20) 20 MEQ tablet Take 20 mEq by mouth 2 (two) times daily.     No current facility-administered medications on file prior to visit.    Allergies  Allergen Reactions  . Amoxicillin-Pot Clavulanate Diarrhea and Nausea And Vomiting    Has patient had a PCN reaction  causing immediate rash, facial/tongue/throat swelling, SOB or lightheadedness with hypotension: Yes Has patient had a PCN reaction causing severe rash involving mucus membranes or skin necrosis: No Has patient had a PCN reaction that required hospitalization: No Has patient had a PCN reaction occurring within the last 10 years: Yes If all of the above answers are "NO", then may proceed with Cephalosporin use.   . Erythromycin Hives   . Tape Other (See Comments)    BAND AIDS-SKIN IRRITATION  . Codeine Nausea And Vomiting  . Doxycycline Diarrhea  . Flagyl [Metronidazole] Hives  . Macrobid [Nitrofurantoin Macrocrystal] Nausea And Vomiting  . Tolterodine Nausea And Vomiting  . Ciprofloxacin Hives and Rash    Past Medical History:  Diagnosis Date  . Cataract   . Chronic congestive heart failure, unspecified heart failure type ([place]) 09/19/2019  . Complete heart block (HCC)    s/p PPM implant (MDT) by Dr Blanch Media.  Atrial lead could not be paced at time of the procedure.  She has chronic AV dysociation  . Delusional disorder ([place])   . Exogenous obesity   . Hypercholesterolemia   . Invasive ductal carcinoma of breast ([place]) 03/2007   BILATERAL BREASTS  . Orthostatic hypotension    treated with midodrine by Dr Rollene [name]  . PONV (postoperative nausea and vomiting)   . Thyroid disease   . Venous insufficiency     Past Surgical History:  Procedure Laterality Date  . ABDOMINAL HYSTERECTOMY    . BREAST LUMPECTOMY Bilateral 2009  . CATARACT EXTRACTION     EYE SURGERY X 2  . CHOLECYSTECTOMY    . COLONOSCOPY N/A 02/16/2015   Procedure: COLONOSCOPY;  Surgeon: Wilford Corner, MD;  Location: [facility] ENDOSCOPY;  Service: Endoscopy;  Laterality: N/A;  . EP IMPLANTABLE DEVICE N/A 02/23/2015   pacemaker generator change (MDT Sensia SR) by Dr Rayann Heman   . ESOPHAGOGASTRODUODENOSCOPY N/A 02/16/2015   Procedure: ESOPHAGOGASTRODUODENOSCOPY (EGD);  Surgeon: Wilford Corner, MD;  Location[facility] ENDOSCOPY;  Service: Endoscopy;  Laterality: N/A;  . IR CT HEAD LTD  09/21/2019  . IR PERCUTANEOUS ART THROMBECTOMY/INFUSION INTRACRANIAL INC DIAG ANGIO  09/21/2019  . PACEMAKER INSERTION     MDT implanted by Dr Blanch Media.  Atrial lead placement was unsuccessful.  She has chronic AV dysociation  . RADIOLOGY WITH ANESTHESIA N/A 09/21/2019   Procedure: IR WITH ANESTHESIA CODE STROKE;  Surgeon: Radiologist, Medication, MD;  Location: [facility];  Service:  Radiology;  Laterality: N/A;  . remote right radical nephrectomy  2009    Social History   Tobacco Use  Smoking Status Never Smoker  Smokeless Tobacco Never Used    Social History   Substance and Sexual Activity  Alcohol Use No  . Alcohol/week: 0.0 standard drinks    Family History  Problem Relation Age of Onset  . Heart disease Maternal Grandmother   . Heart disease Mother     Review of Systems: As noted in history of present illness.  All other systems were reviewed and are negative.  Physical Exam: There were no vitals taken for this visit. GENERAL:  Well appearing obese WF in NAD HEENT:  PERRL, EOMI, sclera are clear. Oropharynx is clear. NECK:  No jugular venous distention, carotid upstroke brisk and symmetric, no bruits, no thyromegaly or adenopathy LUNGS:  Clear to auscultation bilaterally CHEST:  Unremarkable HEART:  RRR,  PMI not displaced or sustained,S1 and S2 within normal limits, no S3, no S4: no clicks, no rubs, no murmurs ABD:  Soft, nontender. BS +, no masses or  bruits. No hepatomegaly, no splenomegaly EXT:  2 + pulses throughout, trace edema. SKIN:  Warm and dry.  No rashes NEURO:  Alert and oriented x 3. Cranial nerves II through XII intact. PSYCH:  Cognitively intact      LABORATORY DATA: Lab Results  Component Value Date   WBC 9.1 09/21/2019   HGB 14.3 09/21/2019   HCT 42.0 09/21/2019   PLT 233 09/21/2019   GLUCOSE 92 01/28/2020   CHOL 141 01/28/2020   TRIG 68 01/28/2020   HDL 61 01/28/2020   LDLCALC 66 01/28/2020   ALT 11 01/28/2020   AST 19 01/28/2020   NA 144 01/28/2020   K 4.1 01/28/2020   CL 106 01/28/2020   CREATININE 1.19 (H) 01/28/2020   BUN 17 01/28/2020   CO2 22 01/28/2020   TSH 2.72 09/17/2019   INR 1.0 09/21/2019   HGBA1C 6.1 (H) 09/22/2019   MICROALBUR 2.0 (H) 11/30/2018   Labs dated 04/12/16: cholesterol 178, triglycerides 101, LDL 94, HDL 64. BUN 18, creatinine 1.24. Other chemistries, TSH, CBC normal. Dated  01/24/17: HDL 62, triglycerides 83, A1c 5.9%.  Dated 08/28/17: cholesterol 185, triglycerides 81, HDL 75, LDL 94. A1c 6.1%.   .Echo 09/22/19: IMPRESSIONS    1. Left ventricular ejection fraction, by estimation, is 55 to 60%. The  left ventricle has normal function. The left ventricle has no regional  wall motion abnormalities. Left ventricular diastolic function could not  be evaluated.  2. Right ventricular systolic function is normal. The right ventricular  size is normal. There is mildly elevated pulmonary artery systolic  pressure. The estimated right ventricular systolic pressure is 16.1 mmHg.  3. The mitral valve is grossly normal. No evidence of mitral valve  regurgitation. No evidence of mitral stenosis.  4. The aortic valve is tricuspid. Aortic valve regurgitation is not  visualized. Mild aortic valve sclerosis is present, with no evidence of  aortic valve stenosis.  5. The inferior vena cava is dilated in size with <50% respiratory  variability, suggesting right atrial pressure of 15 mmHg.    Assessment / Plan: 1. History complete heart block status post permanent VVIR pacemaker placement in June of 2009. Revised 02/23/15.  Clinically doing well. She is followed by Dr. Rayann Heman in the pacer clinic. Last check in March was OK  2. History of orthostatic hypotension-on midodrine once a day. Asymptomatic.  3. Hyperlipidemia reports intolerance of lipitor. LDL 89 in hospital. Now on Crestor 5 mg daily. LDL at goal 66.   4. History of recurrent diverticular bleed.   5. LE edema due to venous stasis. Nonhealing venous ulcer followed by podiatry.   6. S/p  right MCA aborted CVA treated with TPA and mechanical thrombectomy. Did take Plavix for 3 weeks. Plan was to continue ASA  but she stopped ASA due to bleeding. Will not take. Noted Atrial flutter on event monitor. With prior cerebellar bleed in 2020 I doubt she is a candidate for anticoagulation. This is a  moot point since she is  not willing to take anything that will cause her to bleed.   7. Atrial flutter - noted on event monitor.  Rate controlled and asymptomatic. No additional therapy needed.   Follow up 6 months.

## 2020-08-07 NOTE — Progress Notes (Signed)
Remote pacemaker transmission.   

## 2020-08-10 ENCOUNTER — Ambulatory Visit: Payer: Medicare Other | Admitting: Cardiology

## 2020-08-13 NOTE — Progress Notes (Signed)
Cardiology Office Note Date:  08/15/2020  Patient ID:  Nichole, [name] May 06, 1927, MRN [id number] PCP:  Martinique, [name], MD  Cardiologist:  [name] Electrophysiologist: Dr. Rayann Heman     Chief Complaint: annual visit  History of Present Illness: [name] is a 87 y.o. female with history of GIB (2016 suspect diverticular), orthostatic hypotension, 2020 admitted with delusions incidentally found with small cerebellar hemorrhage suspect 2/2 HTN vs cavernous angioma, f/u CT was resolved, May 2021 stroke (treated with tPA and thrombectomy >> monitor noted aflutter, pt refused a/c, PPM 2/2 heart block  Podiatry has been treating a LE/foot wound  She comes in today to be seen for Dr. Rayann Heman, last seen by him via tele health April 2020.  Discussed at the time of her implant Dr. Thersa Salt was unable to place an A lead and done well with VVI pacing.  More recently saw [name], Oct 2021, doing well maintained on midodrine, discussed likely a poor [place] candidate with prior bleeds, though regardless, pt declined.  TODAY She is accompanied by one of her close friend's daughter (she is notparticularly involved in health care of the patient), familiar with her through long friendship with her Mom, knows the patient has a POA, lives independently.  The patient is a little tangential in her stories and repetitive regarding some things, this appears to be her baseline She had a fall about 10 days ago, said she had too many clothes on her walker and it tipped and she fell.  Denies syncope then or evere. She denies any recuirrent falling She denies difficulty in her home, she again was seeing podiatry for a R foot wund, had a visiting nurse twice weekly for several weeks though recently stopped with healing of the wounds She reports her POA is not a family member and does come and visit and calls to check on her.   She denies any CP, palpitations or cardiac awareness No SOB She is very  sedentary, spend much of her day seated by choice it seems, gets up only to get food, use the bathroom     Device information MDT single chamber PPM implanted 02/23/2015 Unable to place Atrial lead at time of implant  Past Medical History:  Diagnosis Date  . Cataract   . Chronic congestive heart failure, unspecified heart failure type ([place]) 09/19/2019  . Complete heart block (HCC)    s/p PPM implant (MDT) by Dr Blanch Media.  Atrial lead could not be paced at time of the procedure.  She has chronic AV dysociation  . Delusional disorder ([place])   . Exogenous obesity   . Hypercholesterolemia   . Invasive ductal carcinoma of breast ([place]) 03/2007   BILATERAL BREASTS  . Orthostatic hypotension    treated with midodrine by Dr Rollene [name]  . PONV (postoperative nausea and vomiting)   . Thyroid disease   . Venous insufficiency     Past Surgical History:  Procedure Laterality Date  . ABDOMINAL HYSTERECTOMY    . BREAST LUMPECTOMY Bilateral 2009  . CATARACT EXTRACTION     EYE SURGERY X 2  . CHOLECYSTECTOMY    . COLONOSCOPY N/A 02/16/2015   Procedure: COLONOSCOPY;  Surgeon: Wilford Corner, MD;  Location: [facility] ENDOSCOPY;  Service: Endoscopy;  Laterality: N/A;  . EP IMPLANTABLE DEVICE N/A 02/23/2015   pacemaker generator change (MDT Sensia SR) by Dr Rayann Heman   . ESOPHAGOGASTRODUODENOSCOPY N/A 02/16/2015   Procedure: ESOPHAGOGASTRODUODENOSCOPY (EGD);  Surgeon: Wilford Corner, MD;  Location: [facility] ENDOSCOPY;  Service: Endoscopy;  Laterality: N/A;  . IR CT HEAD LTD  09/21/2019  . IR PERCUTANEOUS ART THROMBECTOMY/INFUSION INTRACRANIAL INC DIAG ANGIO  09/21/2019  . PACEMAKER INSERTION     MDT implanted by Dr Blanch Media.  Atrial lead placement was unsuccessful.  She has chronic AV dysociation  . RADIOLOGY WITH ANESTHESIA N/A 09/21/2019   Procedure: IR WITH ANESTHESIA CODE STROKE;  Surgeon: Radiologist, Medication, MD;  Location: [place];  Service: Radiology;  Laterality: N/A;  . remote right radical nephrectomy   2009    Current Outpatient Medications  Medication Sig Dispense Refill  . Calcium Carbonate-Vitamin D (CALTRATE 600+D PO) Take 1 tablet by mouth daily after breakfast.     . clindamycin (CLEOCIN) 300 MG capsule Take 1 capsule (300 mg total) by mouth 3 (three) times daily. 30 capsule 0  . fesoterodine (TOVIAZ) 4 MG TB24 tablet Take 1 tablet (4 mg total) by mouth daily. 90 tablet 3  . furosemide (LASIX) 20 MG tablet Take 0.5 tablets (10 mg total) by mouth daily. 45 tablet 2  . Lactobacillus Rhamnosus, GG, (CULTURELLE PO) Take 1 capsule by mouth every morning.    . latanoprost (XALATAN) 0.005 % ophthalmic solution Place 1 drop into both eyes at bedtime. 2.5 mL 10  . levothyroxine (SYNTHROID) 50 MCG tablet Take 1 tablet (50 mcg total) by mouth daily before breakfast. 90 tablet 2  . midodrine (PROAMATINE) 2.5 MG tablet Take 1 tablet (2.5 mg total) by mouth daily after breakfast. 90 tablet 2   No current facility-administered medications for this visit.    Allergies:   Amoxicillin-pot clavulanate, Erythromycin, Tape, Codeine, Doxycycline, Flagyl [metronidazole], Macrobid [nitrofurantoin macrocrystal], Tolterodine, and Ciprofloxacin   Social History:  The patient  reports that she has never smoked. She has never used smokeless tobacco. She reports that she does not drink alcohol and does not use drugs.   Family History:  The patient's family history includes Heart disease in her maternal grandmother and mother.  ROS:  Please see the history of present illness.    All other systems are reviewed and otherwise negative.   PHYSICAL EXAM:  VS:  BP 112/68   Pulse 65   Ht 5' 7.5" (1.715 m)   Wt 212 lb 6.4 oz (96.3 kg)   SpO2 95%   BMI 32.78 kg/m  BMI: Body mass index is 32.78 kg/m. Well nourished, well developed, in no acute distress HEENT: normocephalic, atraumatic Neck: no JVD, carotid bruits or masses Cardiac:  RRR; no significant murmurs, no rubs, or gallops Lungs:  CTA b/l, no wheezing,  rhonchi or rales Abd: soft, nontender MS: no deformity or atrophy Ext: she has very chronic looking skin changes some erythema though no heat with thick plaque type/brown areas.  Thease are clearly not new and assume noted by podiatry, PMD, + edema, somewhat brawny in appearance Skin: warm and dry, no rash Neuro:  No gross deficits appreciated Psych: euthymic mood, full affect  PPM site is stable, no tethering or discomfort   EKG:  Done today and reviewed by myself shows  Looks like Afib V paced  Device interrogation done today and reviewed by myself:  Battery and lead measurements are OK No HVR VP at 40bpm today with a single intrinsic beat rarely, not enough to measure an R wave  11/19/2019: Monitor Presenting rhythm appears to be atrial flutter with ventricular pacing. Baseline artifact limits interpretation. Office 12 lead ekg may be helpful in further evaluation.  The patient's pacemaker is programmed VVIR.  I  therefore cannot exclude sinus with AV dissociation and VVI pacing. Of note, the patient has had prior GI bleeding and declines anticoagulation currently.  Dr Collier Salina Jordan's recent note is reviewed.  She will follow closely with he and Dr Leonie Man.    09/22/2019: TTE IMPRESSIONS  1. Left ventricular ejection fraction, by estimation, is 55 to 60%. The  left ventricle has normal function. The left ventricle has no regional  wall motion abnormalities. Left ventricular diastolic function could not  be evaluated.  2. Right ventricular systolic function is normal. The right ventricular  size is normal. There is mildly elevated pulmonary artery systolic  pressure. The estimated right ventricular systolic pressure is 76.7 mmHg.  3. The mitral valve is grossly normal. No evidence of mitral valve  regurgitation. No evidence of mitral stenosis.  4. The aortic valve is tricuspid. Aortic valve regurgitation is not  visualized. Mild aortic valve sclerosis is present, with no  evidence of  aortic valve stenosis.  5. The inferior vena cava is dilated in size with <50% respiratory  variability, suggesting right atrial pressure of 15 mmHg.   Recent Labs: 09/17/2019: TSH 2.72 09/21/2019: Hemoglobin 14.3; Platelets 233 01/28/2020: ALT 11; BUN 17; Creatinine, Ser 1.19; Potassium 4.1; Sodium 144  09/22/2019: VLDL 9 01/28/2020: Chol/HDL Ratio 2.3; Cholesterol, Total 141; HDL 61; LDL Chol Calc (NIH) 66; Triglycerides 68   CrCl cannot be calculated (Patient's most recent lab result is older than the maximum 21 days allowed.).   Wt Readings from Last 3 Encounters:  08/15/20 212 lb 6.4 oz (96.3 kg)  02/17/20 215 lb (97.5 kg)  02/07/20 216 lb (98 kg)     Other studies reviewed: Additional studies/records reviewed today include: summarized above  ASSESSMENT AND PLAN:  1. PPM     Intact function, no programming changes made  2. PAFlutter, EKG looks like Afib today     Pt has declined a/c, as discussed above     Rate controlled  3. Orthostatic hypotension     chronically on midodrine     Denies dizzy spells     Had a fall recently, denied syncope  4. LE edema, very chronic looking skin changes     The patient reports her legs have been this way for quite some time, goes into a story about someone spraying them and have been "this way ever since", the person with her today denies this as a true story, and suspect some degree of dementia      Her lungs are clear, she is not SOB     She has been seeing podiatry regularly and reports seeing her PMD as well (who she really likes)      There is a POA that apparently is not family but the patient reports close to her and checks on her and calls. I have asked that she see her PMD to follow up on her LE skin care. Advised her to elevate her feet when seated, she says "everyone tells me to do that and I don't like to"   Disposition: F/u with remotes as usual and in clinic in 1 year with EP, sooner if needed.  Current  medicines are reviewed at length with the patient today.  The patient did not have any concerns regarding medicines.  Venetia Night, PA-C 08/15/2020 5:09 PM     [address] [place] [address] [phone] (office)  [phone] (fax)

## 2020-08-15 ENCOUNTER — Encounter: Payer: Self-pay | Admitting: Physician Assistant

## 2020-08-15 ENCOUNTER — Other Ambulatory Visit: Payer: Self-pay

## 2020-08-15 ENCOUNTER — Ambulatory Visit (INDEPENDENT_AMBULATORY_CARE_PROVIDER_SITE_OTHER): Payer: Medicare Other | Admitting: Physician Assistant

## 2020-08-15 VITALS — BP 112/68 | HR 65 | Ht 67.5 in | Wt 212.4 lb

## 2020-08-15 DIAGNOSIS — Z950 Presence of cardiac pacemaker: Secondary | ICD-10-CM

## 2020-08-15 DIAGNOSIS — I5032 Chronic diastolic (congestive) heart failure: Secondary | ICD-10-CM | POA: Diagnosis not present

## 2020-08-15 DIAGNOSIS — I951 Orthostatic hypotension: Secondary | ICD-10-CM

## 2020-08-15 DIAGNOSIS — I4891 Unspecified atrial fibrillation: Secondary | ICD-10-CM

## 2020-08-15 LAB — CUP PACEART INCLINIC DEVICE CHECK
Battery Impedance: 2532 Ohm
Battery Remaining Longevity: 28 mo
Battery Voltage: 2.74 V
Brady Statistic RV Percent Paced: 99 %
Date Time Interrogation Session: 20220419170058
Implantable Lead Implant Date: 20141208
Implantable Lead Location: 753860
Implantable Lead Model: 4076
Implantable Pulse Generator Implant Date: 20161027
Lead Channel Impedance Value: 0 Ohm
Lead Channel Impedance Value: 404 Ohm
Lead Channel Pacing Threshold Amplitude: 1.125 V
Lead Channel Pacing Threshold Amplitude: 1.25 V
Lead Channel Pacing Threshold Pulse Width: 0.4 ms
Lead Channel Pacing Threshold Pulse Width: 0.4 ms
Lead Channel Setting Pacing Amplitude: 2.25 V
Lead Channel Setting Pacing Pulse Width: 0.4 ms
Lead Channel Setting Sensing Sensitivity: 2 mV

## 2020-08-15 NOTE — Patient Instructions (Signed)

## 2020-08-16 NOTE — Addendum Note (Signed)
Addended by: Georgiann Cocker on: 08/16/2020 04:28 PM   Modules accepted: Orders

## 2020-09-16 ENCOUNTER — Other Ambulatory Visit: Payer: Self-pay | Admitting: Family Medicine

## 2020-09-16 DIAGNOSIS — I872 Venous insufficiency (chronic) (peripheral): Secondary | ICD-10-CM

## 2020-10-06 ENCOUNTER — Ambulatory Visit: Payer: Medicare Other

## 2020-10-06 ENCOUNTER — Ambulatory Visit (INDEPENDENT_AMBULATORY_CARE_PROVIDER_SITE_OTHER): Payer: Medicare Other

## 2020-10-06 DIAGNOSIS — Z0000 Encounter for general adult medical examination without abnormal findings: Secondary | ICD-10-CM

## 2020-10-06 NOTE — Progress Notes (Signed)
Subjective:   [name] is a 87 y.o. female who presents for an Initial Medicare Annual Wellness Visit.  I connected with [name] today by telephone and verified that I am speaking with the correct person using two identifiers. Location patient: home Location provider: work Persons participating in the virtual visit: patient, provider.   I discussed the limitations, risks, security and privacy concerns of performing an evaluation and management service by telephone and the availability of in person appointments. I also discussed with the patient that there may be a patient responsible charge related to this service. The patient expressed understanding and verbally consented to this telephonic visit.    Interactive audio and video telecommunications were attempted between this provider and patient, however failed, due to patient having technical difficulties OR patient did not have access to video capability.  We continued and completed visit with audio only.    Review of Systems    N/a       Objective:    There were no vitals filed for this visit. There is no height or weight on file to calculate BMI.  Advanced Directives 09/22/2019 09/21/2019 04/02/2019 04/02/2019 10/04/2018 10/04/2018 04/01/2017  Does Patient Have a Medical Advance Directive? - Yes Yes Yes Yes Yes -  Type of Advance Directive - Healthcare Power of [place]  Does patient want to make changes to medical advance directive? Yes (Inpatient - patient defers changing a medical advance directive at this time - Information given) - No - Patient declined No - Patient declined - No - Patient declined -  Copy of [place] in Chart? - - - Yes - validated most recent copy scanned in chart (See row information) - - No - copy requested  Would patient like information on creating a medical  advance directive? - - No - Patient declined No - Patient declined - - No - Patient declined    Current Medications (verified) Outpatient Encounter Medications as of 10/06/2020  Medication Sig   Calcium Carbonate-Vitamin D (CALTRATE 600+D PO) Take 1 tablet by mouth daily after breakfast.    clindamycin (CLEOCIN) 300 MG capsule Take 1 capsule (300 mg total) by mouth 3 (three) times daily.   fesoterodine (TOVIAZ) 4 MG TB24 tablet Take 1 tablet (4 mg total) by mouth daily.   furosemide (LASIX) 20 MG tablet TAKE 1/2 TABLET DAILY   Lactobacillus Rhamnosus, GG, (CULTURELLE PO) Take 1 capsule by mouth every morning.   latanoprost (XALATAN) 0.005 % ophthalmic solution Place 1 drop into both eyes at bedtime.   levothyroxine (SYNTHROID) 50 MCG tablet Take 1 tablet (50 mcg total) by mouth daily before breakfast.   midodrine (PROAMATINE) 2.5 MG tablet Take 1 tablet (2.5 mg total) by mouth daily after breakfast.   No facility-administered encounter medications on file as of 10/06/2020.    Allergies (verified) Amoxicillin-pot clavulanate, Erythromycin, Tape, Codeine, Doxycycline, Flagyl [metronidazole], Macrobid [nitrofurantoin macrocrystal], Tolterodine, and Ciprofloxacin   History: Past Medical History:  Diagnosis Date   Cataract    Chronic congestive heart failure, unspecified heart failure type ([facility]) 09/19/2019   Complete heart block (HCC)    s/p PPM implant (MDT) by Dr Blanch Media.  Atrial lead could not be paced at time of the procedure.  She has chronic AV dysociation   Delusional disorder ([place])    Exogenous obesity    Hypercholesterolemia    Invasive ductal carcinoma of  breast ([place]) 03/2007   BILATERAL BREASTS   Orthostatic hypotension    treated with midodrine by Dr Rollene [name]   PONV (postoperative nausea and vomiting)    Thyroid disease    Venous insufficiency    Past Surgical History:  Procedure Laterality Date   ABDOMINAL HYSTERECTOMY     BREAST LUMPECTOMY Bilateral 2009   CATARACT  EXTRACTION     EYE SURGERY X 2   CHOLECYSTECTOMY     COLONOSCOPY N/A 02/16/2015   Procedure: COLONOSCOPY;  Surgeon: Wilford Corner, MD;  Location: [facility] ENDOSCOPY;  Service: Endoscopy;  Laterality: N/A;   EP IMPLANTABLE DEVICE N/A 02/23/2015   pacemaker generator change (MDT Sensia SR) by Dr Rayann Heman    ESOPHAGOGASTRODUODENOSCOPY N/A 02/16/2015   Procedure: ESOPHAGOGASTRODUODENOSCOPY (EGD);  Surgeon: Wilford Corner, MD;  Location: [facility] ENDOSCOPY;  Service: Endoscopy;  Laterality: N/A;   IR CT HEAD LTD  09/21/2019   IR PERCUTANEOUS ART THROMBECTOMY/INFUSION INTRACRANIAL INC DIAG ANGIO  09/21/2019   PACEMAKER INSERTION     MDT implanted by Dr Blanch Media.  Atrial lead placement was unsuccessful.  She has chronic AV dysociation   RADIOLOGY WITH ANESTHESIA N/A 09/21/2019   Procedure: IR WITH ANESTHESIA CODE STROKE;  Surgeon: Radiologist, Medication, MD;  Location: [place];  Service: Radiology;  Laterality: N/A;   remote right radical nephrectomy  2009   Family History  Problem Relation Age of Onset   Heart disease Maternal Grandmother    Heart disease Mother    Social History   Socioeconomic History   Marital status: Widowed    Spouse name: Not on file   Number of children: Not on file   Years of education: Not on file   Highest education level: Not on file  Occupational History   Not on file  Tobacco Use   Smoking status: Never   Smokeless tobacco: Never  Vaping Use   Vaping Use: Never used  Substance and Sexual Activity   Alcohol use: No    Alcohol/week: 0.0 standard drinks   Drug use: No   Sexual activity: Not Currently  Other Topics Concern   Not on file  Social History Narrative   Not on file   Social Determinants of Health   Financial Resource Strain: Not on file  Food Insecurity: Not on file  Transportation Needs: Not on file  Physical Activity: Not on file  Stress: Not on file  Social Connections: Not on file    Tobacco Counseling Counseling given: Not  Answered   Clinical Intake:                 Diabetic?no          Activities of Daily Living No flowsheet data found.  Patient Care Team: Martinique, [name], MD as PCP - General (Family Medicine) Martinique, Peter M, MD as PCP - Cardiology (Cardiology) Thompson Grayer, MD as Referring Physician (Cardiology)  Indicate any recent Medical Services you may have received from other than [facility] providers in the past year (date may be approximate).     Assessment:   This is a routine wellness examination for Dashawn.  Hearing/Vision screen No results found.  Dietary issues and exercise activities discussed:     Goals Addressed   None    Depression Screen PHQ 2/9 Scores 04/20/2019 10/22/2017  PHQ - 2 Score 0 0    Fall Risk Fall Risk  12/25/2015  Falls in the past year? Yes  Comment Emmi Telephone Survey: data to providers prior to load  Number falls  in past yr: 2 or more  Comment Emmi Telephone Survey Actual Response = 90  Injury with Fall? Yes    FALL RISK PREVENTION PERTAINING TO THE HOME:  Any stairs in or around the home? No  If so, are there any without handrails? No  Home free of loose throw rugs in walkways, pet beds, electrical cords, etc? Yes  Adequate lighting in your home to reduce risk of falls? Yes   ASSISTIVE DEVICES UTILIZED TO PREVENT FALLS:  Life alert? No  Use of a cane, walker or w/c? Yes  Grab bars in the bathroom? Yes  Shower chair or bench in shower? Yes  Elevated toilet seat or a handicapped toilet? Yes     Cognitive Function:    Normal cognitive status assessed by direct observation by this Nurse Health Advisor. No abnormalities found.      Immunizations Immunization History  Administered Date(s) Administered   Fluad Quad(high Dose 65+) 01/09/2019   Influenza, High Dose Seasonal PF 02/29/2020   Influenza,inj,Quad PF,6+ Mos 02/16/2015   PFIZER(Purple Top)SARS-COV-2 Vaccination 08/31/2019, 12/21/2019   Pneumococcal Conjugate-13  03/20/2018    TDAP status: Due, Education has been provided regarding the importance of this vaccine. Advised may receive this vaccine at local pharmacy or Health Dept. Aware to provide a copy of the vaccination record if obtained from local pharmacy or Health Dept. Verbalized acceptance and understanding.  Flu Vaccine status: Up to date  Pneumococcal vaccine status: Up to date  Covid-19 vaccine status: Completed vaccines  Qualifies for Shingles Vaccine? Yes   Zostavax completed No   Shingrix Completed?: No.    Education has been provided regarding the importance of this vaccine. Patient has been advised to call insurance company to determine out of pocket expense if they have not yet received this vaccine. Advised may also receive vaccine at local pharmacy or Health Dept. Verbalized acceptance and understanding.  Screening Tests Health Maintenance  Topic Date Due   Zoster Vaccines- Shingrix (1 of 2) Never done   COVID-19 Vaccine (3 - Pfizer risk series) 01/18/2020   INFLUENZA VACCINE  11/27/2020   DEXA SCAN  Completed   HPV VACCINES  Aged Out   TETANUS/TDAP  Discontinued   PNA vac Low Risk Adult  Discontinued    Health Maintenance  Health Maintenance Due  Topic Date Due   Zoster Vaccines- Shingrix (1 of 2) Never done   COVID-19 Vaccine (3 - Pfizer risk series) 01/18/2020    Colorectal cancer screening: No longer required.   Mammogram status: No longer required due to age.  Bone Density status: Completed 05/11/2014. Results reflect: Bone density results: OSTEOPOROSIS. Repeat every 2 years.  Lung Cancer Screening: (Low Dose CT Chest recommended if Age 5-80 years, 30 pack-year currently smoking OR have quit w/in 15years.) does not qualify.   Lung Cancer Screening Referral: n/a  Additional Screening:  Hepatitis C Screening: does not qualify  Vision Screening: Recommended annual ophthalmology exams for early detection of glaucoma and other disorders of the eye. Is the  patient up to date with their annual eye exam?  Yes  Who is the provider or what is the name of the office in which the patient attends annual eye exams? [facility] If pt is not established with a provider, would they like to be referred to a provider to establish care? No .   Dental Screening: Recommended annual dental exams for proper oral hygiene  Community Resource Referral / Chronic Care Management: CRR required this visit?  No  CCM required this visit?  No      Plan:     I have personally reviewed and noted the following in the patient's chart:   Medical and social history Use of alcohol, tobacco or illicit drugs  Current medications and supplements including opioid prescriptions. Patient is not currently taking opioid prescriptions. Functional ability and status Nutritional status Physical activity Advanced directives List of other physicians Hospitalizations, surgeries, and ER visits in previous 12 months Vitals Screenings to include cognitive, depression, and falls Referrals and appointments  In addition, I have reviewed and discussed with patient certain preventive protocols, quality metrics, and best practice recommendations. A written personalized care plan for preventive services as well as general preventive health recommendations were provided to patient.     Randel Pigg, LPN   8/89/1694   Nurse Notes: none

## 2020-10-06 NOTE — Patient Instructions (Signed)
[name] , Thank you for taking time to come for your Medicare Wellness Visit. I appreciate your ongoing commitment to your health goals. Please review the following plan we discussed and let me know if I can assist you in the future.   Screening recommendations/referrals: Colonoscopy: no longer required Mammogram: no longer required Bone Density: 05/11/2014 Recommended yearly ophthalmology/optometry visit for glaucoma screening and checkup Recommended yearly dental visit for hygiene and checkup  Vaccinations: Influenza vaccine: due in fall of 2022 Pneumococcal vaccine: completed series Tdap vaccine: due Shingles vaccine: patient declined  Advanced directives: will provide copies  Conditions/risks identified: none   Next appointment: none    Preventive Care 90 Years and Older, Female Preventive care refers to lifestyle choices and visits with your health care provider that can promote health and wellness. What does preventive care include? A yearly physical exam. This is also called an annual well check. Dental exams once or twice a year. Routine eye exams. Ask your health care provider how often you should have your eyes checked. Personal lifestyle choices, including: Daily care of your teeth and gums. Regular physical activity. Eating a healthy diet. Avoiding tobacco and drug use. Limiting alcohol use. Practicing safe sex. Taking low-dose aspirin every day. Taking vitamin and mineral supplements as recommended by your health care provider. What happens during an annual well check? The services and screenings done by your health care provider during your annual well check will depend on your age, overall health, lifestyle risk factors, and family history of disease. Counseling  Your health care provider may ask you questions about your: Alcohol use. Tobacco use. Drug use. Emotional well-being. Home and relationship well-being. Sexual activity. Eating  habits. History of falls. Memory and ability to understand (cognition). Work and work Statistician. Reproductive health. Screening  You may have the following tests or measurements: Height, weight, and BMI. Blood pressure. Lipid and cholesterol levels. These may be checked every 5 years, or more frequently if you are over 31 years old. Skin check. Lung cancer screening. You may have this screening every year starting at age 73 if you have a 30-pack-year history of smoking and currently smoke or have quit within the past 15 years. Fecal occult blood test (FOBT) of the stool. You may have this test every year starting at age 5. Flexible sigmoidoscopy or colonoscopy. You may have a sigmoidoscopy every 5 years or a colonoscopy every 10 years starting at age 3. Hepatitis C blood test. Hepatitis B blood test. Sexually transmitted disease (STD) testing. Diabetes screening. This is done by checking your blood sugar (glucose) after you have not eaten for a while (fasting). You may have this done every 1-3 years. Bone density scan. This is done to screen for osteoporosis. You may have this done starting at age 40. Mammogram. This may be done every 1-2 years. Talk to your health care provider about how often you should have regular mammograms. Talk with your health care provider about your test results, treatment options, and if necessary, the need for more tests. Vaccines  Your health care provider may recommend certain vaccines, such as: Influenza vaccine. This is recommended every year. Tetanus, diphtheria, and acellular pertussis (Tdap, Td) vaccine. You may need a Td booster every 10 years. Zoster vaccine. You may need this after age 3. Pneumococcal 13-valent conjugate (PCV13) vaccine. One dose is recommended after age 89. Pneumococcal polysaccharide (PPSV23) vaccine. One dose is recommended after age 85. Talk to your health care provider about which screenings and vaccines  you need and how  often you need them. This information is not intended to replace advice given to you by your health care provider. Make sure you discuss any questions you have with your health care provider. Document Released: 05/12/2015 Document Revised: 01/03/2016 Document Reviewed: 02/14/2015 Elsevier Interactive Patient Education  2017 [place] Prevention in the Home Falls can cause injuries. They can happen to people of all ages. There are many things you can do to make your home safe and to help prevent falls. What can I do on the outside of my home? Regularly fix the edges of walkways and driveways and fix any cracks. Remove anything that might make you trip as you walk through a door, such as a raised step or threshold. Trim any bushes or trees on the path to your home. Use bright outdoor lighting. Clear any walking paths of anything that might make someone trip, such as rocks or tools. Regularly check to see if handrails are loose or broken. Make sure that both sides of any steps have handrails. Any raised decks and porches should have guardrails on the edges. Have any leaves, snow, or ice cleared regularly. Use sand or salt on walking paths during winter. Clean up any spills in your garage right away. This includes oil or grease spills. What can I do in the bathroom? Use night lights. Install grab bars by the toilet and in the tub and shower. Do not use towel bars as grab bars. Use non-skid mats or decals in the tub or shower. If you need to sit down in the shower, use a plastic, non-slip stool. Keep the floor dry. Clean up any water that spills on the floor as soon as it happens. Remove soap buildup in the tub or shower regularly. Attach bath mats securely with double-sided non-slip rug tape. Do not have throw rugs and other things on the floor that can make you trip. What can I do in the bedroom? Use night lights. Make sure that you have a light by your bed that is easy to  reach. Do not use any sheets or blankets that are too big for your bed. They should not hang down onto the floor. Have a firm chair that has side arms. You can use this for support while you get dressed. Do not have throw rugs and other things on the floor that can make you trip. What can I do in the kitchen? Clean up any spills right away. Avoid walking on wet floors. Keep items that you use a lot in easy-to-reach places. If you need to reach something above you, use a strong step stool that has a grab bar. Keep electrical cords out of the way. Do not use floor polish or wax that makes floors slippery. If you must use wax, use non-skid floor wax. Do not have throw rugs and other things on the floor that can make you trip. What can I do with my stairs? Do not leave any items on the stairs. Make sure that there are handrails on both sides of the stairs and use them. Fix handrails that are broken or loose. Make sure that handrails are as long as the stairways. Check any carpeting to make sure that it is firmly attached to the stairs. Fix any carpet that is loose or worn. Avoid having throw rugs at the top or bottom of the stairs. If you do have throw rugs, attach them to the floor with carpet tape. Make sure  that you have a light switch at the top of the stairs and the bottom of the stairs. If you do not have them, ask someone to add them for you. What else can I do to help prevent falls? Wear shoes that: Do not have high heels. Have rubber bottoms. Are comfortable and fit you well. Are closed at the toe. Do not wear sandals. If you use a stepladder: Make sure that it is fully opened. Do not climb a closed stepladder. Make sure that both sides of the stepladder are locked into place. Ask someone to hold it for you, if possible. Clearly mark and make sure that you can see: Any grab bars or handrails. First and last steps. Where the edge of each step is. Use tools that help you move  around (mobility aids) if they are needed. These include: Canes. Walkers. Scooters. Crutches. Turn on the lights when you go into a dark area. Replace any light bulbs as soon as they burn out. Set up your furniture so you have a clear path. Avoid moving your furniture around. If any of your floors are uneven, fix them. If there are any pets around you, be aware of where they are. Review your medicines with your doctor. Some medicines can make you feel dizzy. This can increase your chance of falling. Ask your doctor what other things that you can do to help prevent falls. This information is not intended to replace advice given to you by your health care provider. Make sure you discuss any questions you have with your health care provider. Document Released: 02/09/2009 Document Revised: 09/21/2015 Document Reviewed: 05/20/2014 Elsevier Interactive Patient Education  2017 [employer].

## 2020-10-09 ENCOUNTER — Other Ambulatory Visit: Payer: Self-pay

## 2020-10-25 ENCOUNTER — Other Ambulatory Visit: Payer: Self-pay

## 2020-10-25 ENCOUNTER — Ambulatory Visit (INDEPENDENT_AMBULATORY_CARE_PROVIDER_SITE_OTHER): Payer: Medicare Other | Admitting: Podiatry

## 2020-10-25 DIAGNOSIS — B351 Tinea unguium: Secondary | ICD-10-CM

## 2020-10-25 DIAGNOSIS — M79676 Pain in unspecified toe(s): Secondary | ICD-10-CM | POA: Diagnosis not present

## 2020-10-25 DIAGNOSIS — L97512 Non-pressure chronic ulcer of other part of right foot with fat layer exposed: Secondary | ICD-10-CM | POA: Diagnosis not present

## 2020-10-25 DIAGNOSIS — L97519 Non-pressure chronic ulcer of other part of right foot with unspecified severity: Secondary | ICD-10-CM

## 2020-10-25 DIAGNOSIS — I83015 Varicose veins of right lower extremity with ulcer other part of foot: Secondary | ICD-10-CM

## 2020-10-25 DIAGNOSIS — L989 Disorder of the skin and subcutaneous tissue, unspecified: Secondary | ICD-10-CM

## 2020-10-25 NOTE — Progress Notes (Signed)
.   Subjective:  87 y.o. female presenting today for follow up evaluation of an ulceration of the right foot.  Patient has been doing well and she has been nurses coming out to her home 2 times per week for dressing changes.  She is very satisfied with the home health nurse agency  Patient also is requesting a nail trim today.  Her nails are thick and long and she is unable to trim them herself.  They are becoming very symptomatic and painful in her shoes  Past Medical History:  Diagnosis Date   Cataract    Chronic congestive heart failure, unspecified heart failure type ([place]) 09/19/2019   Complete heart block (HCC)    s/p PPM implant (MDT) by Dr Blanch Media.  Atrial lead could not be paced at time of the procedure.  She has chronic AV dysociation   Delusional disorder ([place])    Exogenous obesity    Hypercholesterolemia    Invasive ductal carcinoma of breast ([place]) 03/2007   BILATERAL BREASTS   Orthostatic hypotension    treated with midodrine by Dr Rollene [name]   PONV (postoperative nausea and vomiting)    Thyroid disease    Venous insufficiency      Objective/Physical Exam General: The patient is alert and oriented x3 in no acute distress.  Dermatology:  Wound #1 noted to the right foot measuring 0.1 x 0.1 x 0.1 cm (LxWxD).  There continues to be improvement of the wound  To the noted ulceration(s), there is no eschar. There is a moderate amount of slough, fibrin, and necrotic tissue noted. Granulation tissue and wound base is red. There is a minimal amount of serosanguineous drainage noted. There is no exposed bone muscle-tendon ligament or joint. There is no malodor. Periwound integrity is intact. Erythema and edema noted to the right lower extremity.   Hyperkeratotic dystrophic thickened nails noted 1-5 bilateral  Vascular: Palpable pedal pulses bilaterally. Capillary refill within normal limits. Varicosities noted bilateral lower extremities.   Neurological: Epicritic and  protective threshold diminished bilaterally.   Musculoskeletal Exam: Advanced degenerative changes noted to the bilateral feet with arch collapse and progressive DJD given the patient's age.  Assessment: #1 ulceration of the right foot secondary to venous insufficiency #2 varicosities bilateral lower extremities #3 pain due to onychomycosis of toenails bilateral #4 preulcerative callus lesions bilateral   Plan of Care:  #1 Patient was evaluated.  #2 medically necessary excisional debridement including subcutaneous tissue was performed using a tissue nipper and a chisel blade. Excisional debridement of all the necrotic nonviable tissue down to healthy bleeding viable tissue was performed with post-debridement measurements same as pre-.  Wound is mostly healed today. #3 the wound was cleansed with normal saline. #4 Dry sterile dressing applied.   #5 Continue home health dressings at home 2 times per week.  #6  Mechanical debridement of nails 1-5 bilateral was performed using a nail nipper without incident or bleeding  #7 excisional debridement of the hyperkeratotic preulcerative callus lesions was also performed using a 312 scalpel without incident or bleeding  #8 return to clinic in 3 months for routine foot care  Edrick Kins, DPM [facility]  Dr. Edrick Kins, DPM    [address], [place] [address]  Office [phone]  Fax [phone]

## 2020-11-03 DIAGNOSIS — H401132 Primary open-angle glaucoma, bilateral, moderate stage: Secondary | ICD-10-CM | POA: Diagnosis not present

## 2020-11-03 DIAGNOSIS — Z961 Presence of intraocular lens: Secondary | ICD-10-CM | POA: Diagnosis not present

## 2020-11-03 DIAGNOSIS — H43813 Vitreous degeneration, bilateral: Secondary | ICD-10-CM | POA: Diagnosis not present

## 2020-11-24 NOTE — Progress Notes (Signed)
[name] Date of Birth: February 09, 1928 Medical Record [id number]  History of Present Illness: [name] is seen today for follow up of complete heart block.  She has a history of complete heart block with 12 second pause and underwent permanent pacemaker implant on 10/09/2007 with a Medtronic adapta VVI pacemaker. She had revision of her pacemaker on February 23, 2015. She was admitted at that time with a GI bleed felt to be diverticular. No recurrence.  She also has a history of orthostatic dizziness managed with midodrine. She has no history of coronary disease or congestive heart failure. Last pacemaker check in December 2019 was normal.  She was admitted in June 2018 with acute diverticulitis. Treated with hydration and antibiotics. Admitted in November and December 2018 with hematochezia. Felt to be diverticular bleed. Conservative therapy recommended. ASA discontinued.   She has a history of chronic nonhealing wound on her right leg. She was seen in September in the ED with complaints of asymmetric leg swelling. Doppler negative for DVT.  Of note in June 2020 she was admitted with delusional ideation.  She presented without any focal neurological symptoms or deficits but due to symptoms of delusion hallucinations had a CT scan done which showed a tiny incidental 8 mm midline cerebellar hemorrhage exact etiology unclear but unlikely to be hypertensive.  Possibly cavernous angioma. Repeat follow up CT showed resolution of hemorrhage.   She was recently admitted 5/25-5/27/21 with acute right MCA CVA. She was treated emergently with TPA and mechanical thrombectomy with aborted stroke. Discharged on ASA and plavix for 3 weeks then ASA only. Started on statin. Echo was unremarkable. 30 day event monitor was scheduled. As recorded on 7/9 this showed atrial flutter with variable block rate 60.   On follow up today she has no specific complaints. Denies any recurrent CVA symptoms. No increase  in swelling. States her remote pacer transmitter broke and she is going to get a new one. Quit taking Crestor- states it caused her to have diarrhea. Still has diarrhea a lot. Refuses to take ASA or Plavix.     Current Outpatient Medications on File Prior to Visit  Medication Sig Dispense Refill   Calcium Carbonate-Vitamin D (CALTRATE 600+D PO) Take 1 tablet by mouth daily after breakfast.      furosemide (LASIX) 20 MG tablet TAKE 1/2 TABLET DAILY 45 tablet 1   Lactobacillus Rhamnosus, GG, (CULTURELLE PO) Take 1 capsule by mouth every morning.     latanoprost (XALATAN) 0.005 % ophthalmic solution Place 1 drop into both eyes at bedtime. 2.5 mL 10   levothyroxine (SYNTHROID) 50 MCG tablet Take 1 tablet (50 mcg total) by mouth daily before breakfast. 90 tablet 2   midodrine (PROAMATINE) 2.5 MG tablet Take 1 tablet (2.5 mg total) by mouth daily after breakfast. 90 tablet 2   No current facility-administered medications on file prior to visit.    Allergies  Allergen Reactions   Amoxicillin-Pot Clavulanate Diarrhea and Nausea And Vomiting    Has patient had a PCN reaction causing immediate rash, facial/tongue/throat swelling, SOB or lightheadedness with hypotension: Yes Has patient had a PCN reaction causing severe rash involving mucus membranes or skin necrosis: No Has patient had a PCN reaction that required hospitalization: No Has patient had a PCN reaction occurring within the last 10 years: Yes If all of the above answers are "NO", then may proceed with Cephalosporin use.    Erythromycin Hives   Tape Other (See Comments)    BAND AIDS-SKIN  IRRITATION   Codeine Nausea And Vomiting   Doxycycline Diarrhea   Flagyl [Metronidazole] Hives   Macrobid [Nitrofurantoin Macrocrystal] Nausea And Vomiting   Tolterodine Nausea And Vomiting   Ciprofloxacin Hives and Rash    Past Medical History:  Diagnosis Date   Cataract    Chronic congestive heart failure, unspecified heart failure type ([place])  09/19/2019   Complete heart block (HCC)    s/p PPM implant (MDT) by Dr Blanch Media.  Atrial lead could not be paced at time of the procedure.  She has chronic AV dysociation   Delusional disorder ([place])    Exogenous obesity    Hypercholesterolemia    Invasive ductal carcinoma of breast ([place]) 03/2007   BILATERAL BREASTS   Orthostatic hypotension    treated with midodrine by Dr Rollene [name]   PONV (postoperative nausea and vomiting)    Thyroid disease    Venous insufficiency     Past Surgical History:  Procedure Laterality Date   ABDOMINAL HYSTERECTOMY     BREAST LUMPECTOMY Bilateral 2009   CATARACT EXTRACTION     EYE SURGERY X 2   CHOLECYSTECTOMY     COLONOSCOPY N/A 02/16/2015   Procedure: COLONOSCOPY;  Surgeon: Wilford Corner, MD;  Location[facility] ENDOSCOPY;  Service: Endoscopy;  Laterality: N/A;   EP IMPLANTABLE DEVICE N/A 02/23/2015   pacemaker generator change (MDT Sensia SR) by Dr Rayann Heman    ESOPHAGOGASTRODUODENOSCOPY N/A 02/16/2015   Procedure: ESOPHAGOGASTRODUODENOSCOPY (EGD);  Surgeon: Wilford Corner, MD;  Location: [facility] ENDOSCOPY;  Service: Endoscopy;  Laterality: N/A;   IR CT HEAD LTD  09/21/2019   IR PERCUTANEOUS ART THROMBECTOMY/INFUSION INTRACRANIAL INC DIAG ANGIO  09/21/2019   PACEMAKER INSERTION     MDT implanted by Dr Blanch Media.  Atrial lead placement was unsuccessful.  She has chronic AV dysociation   RADIOLOGY WITH ANESTHESIA N/A 09/21/2019   Procedure: IR WITH ANESTHESIA CODE STROKE;  Surgeon: Radiologist, Medication, MD;  Location: [facility];  Service: Radiology;  Laterality: N/A;   remote right radical nephrectomy  2009    Social History   Tobacco Use  Smoking Status Never  Smokeless Tobacco Never    Social History   Substance and Sexual Activity  Alcohol Use No   Alcohol/week: 0.0 standard drinks    Family History  Problem Relation Age of Onset   Heart disease Maternal Grandmother    Heart disease Mother     Review of Systems: As noted in history of present  illness.  All other systems were reviewed and are negative.  Physical Exam: BP 116/68   Pulse 63   Resp 20   Ht '5\' 7"'$  (1.702 m)   Wt 215 lb 9.6 oz (97.8 kg)   SpO2 94%   BMI 33.77 kg/m  GENERAL:  Well appearing overweight WF in NAD HEENT:  PERRL, EOMI, sclera are clear. Oropharynx is clear. NECK:  No jugular venous distention, carotid upstroke brisk and symmetric, no bruits, no thyromegaly or adenopathy LUNGS:  Clear to auscultation bilaterally CHEST:  Unremarkable HEART:  RRR,  PMI not displaced or sustained,S1 and S2 within normal limits, no S3, no S4: no clicks, no rubs, no murmurs ABD:  Soft, nontender. BS +, no masses or bruits. No hepatomegaly, no splenomegaly EXT:  2 + pulses throughout, trace edema. SKIN:  Warm and dry.  No rashes NEURO:  Alert and oriented x 3. Cranial nerves II through XII intact. PSYCH:  Cognitively intact      LABORATORY DATA: Lab Results  Component Value Date   WBC  9.1 09/21/2019   HGB 14.3 09/21/2019   HCT 42.0 09/21/2019   PLT 233 09/21/2019   GLUCOSE 92 01/28/2020   CHOL 141 01/28/2020   TRIG 68 01/28/2020   HDL 61 01/28/2020   LDLCALC 66 01/28/2020   ALT 11 01/28/2020   AST 19 01/28/2020   NA 144 01/28/2020   K 4.1 01/28/2020   CL 106 01/28/2020   CREATININE 1.19 (H) 01/28/2020   BUN 17 01/28/2020   CO2 22 01/28/2020   TSH 2.72 09/17/2019   INR 1.0 09/21/2019   HGBA1C 6.1 (H) 09/22/2019   MICROALBUR 2.0 (H) 11/30/2018   Labs dated 04/12/16: cholesterol 178, triglycerides 101, LDL 94, HDL 64. BUN 18, creatinine 1.24. Other chemistries, TSH, CBC normal. Dated 01/24/17: HDL 62, triglycerides 83, A1c 5.9%.  Dated 08/28/17: cholesterol 185, triglycerides 81, HDL 75, LDL 94. A1c 6.1%.   Ecg today shows V paced rhythm rate 63. I have personally reviewed and interpreted this study.   .Echo 09/22/19: IMPRESSIONS     1. Left ventricular ejection fraction, by estimation, is 55 to 60%. The  left ventricle has normal function. The left  ventricle has no regional  wall motion abnormalities. Left ventricular diastolic function could not  be evaluated.   2. Right ventricular systolic function is normal. The right ventricular  size is normal. There is mildly elevated pulmonary artery systolic  pressure. The estimated right ventricular systolic pressure is 99991111 mmHg.   3. The mitral valve is grossly normal. No evidence of mitral valve  regurgitation. No evidence of mitral stenosis.   4. The aortic valve is tricuspid. Aortic valve regurgitation is not  visualized. Mild aortic valve sclerosis is present, with no evidence of  aortic valve stenosis.   5. The inferior vena cava is dilated in size with <50% respiratory  variability, suggesting right atrial pressure of 15 mmHg.    Assessment / Plan: 1. History complete heart block status post permanent VVIR pacemaker placement in June of 2009. Revised 02/23/15.  Clinically doing well. She is followed by Dr. Rayann Heman in the pacer clinic.  2. History of orthostatic hypotension-on midodrine once a day. Asymptomatic.  3. Hyperlipidemia reports intolerance of lipitor. LDL 89 in hospital. Now on Crestor 5 mg daily and LDL improved to 61 but she is unwilling to take medication.   4. History of recurrent diverticular bleed.   5. LE edema due to venous stasis. Stable.   6. Recent right MCA aborted CVA treated with TPA and mechanical thrombectomy. Did take Plavix for 3 weeks. Plan was to continue ASA  but she stopped ASA due to bleeding. Will not take. Noted Atrial flutter on event monitor. She is not willing to take any antiplatelet or anticoagulant therapy.  7. Atrial flutter - noted on event monitor.  Rate controlled and asymptomatic. No additional therapy needed.   Follow up 6 months.

## 2020-12-04 ENCOUNTER — Encounter: Payer: Self-pay | Admitting: Cardiology

## 2020-12-04 ENCOUNTER — Other Ambulatory Visit: Payer: Self-pay

## 2020-12-04 ENCOUNTER — Ambulatory Visit (INDEPENDENT_AMBULATORY_CARE_PROVIDER_SITE_OTHER): Payer: Medicare Other | Admitting: Cardiology

## 2020-12-04 VITALS — BP 116/68 | HR 63 | Resp 20 | Ht 67.0 in | Wt 215.6 lb

## 2020-12-04 DIAGNOSIS — I951 Orthostatic hypotension: Secondary | ICD-10-CM | POA: Diagnosis not present

## 2020-12-04 DIAGNOSIS — Z950 Presence of cardiac pacemaker: Secondary | ICD-10-CM | POA: Diagnosis not present

## 2020-12-04 DIAGNOSIS — I442 Atrioventricular block, complete: Secondary | ICD-10-CM

## 2020-12-04 DIAGNOSIS — I4892 Unspecified atrial flutter: Secondary | ICD-10-CM

## 2020-12-04 DIAGNOSIS — I639 Cerebral infarction, unspecified: Secondary | ICD-10-CM | POA: Diagnosis not present

## 2021-01-04 ENCOUNTER — Other Ambulatory Visit: Payer: Self-pay | Admitting: Family Medicine

## 2021-01-04 DIAGNOSIS — E039 Hypothyroidism, unspecified: Secondary | ICD-10-CM

## 2021-01-16 DIAGNOSIS — Z23 Encounter for immunization: Secondary | ICD-10-CM | POA: Diagnosis not present

## 2021-01-17 ENCOUNTER — Ambulatory Visit (INDEPENDENT_AMBULATORY_CARE_PROVIDER_SITE_OTHER): Payer: Medicare Other | Admitting: Podiatry

## 2021-01-17 ENCOUNTER — Other Ambulatory Visit: Payer: Self-pay

## 2021-01-17 DIAGNOSIS — L989 Disorder of the skin and subcutaneous tissue, unspecified: Secondary | ICD-10-CM

## 2021-01-17 DIAGNOSIS — M79676 Pain in unspecified toe(s): Secondary | ICD-10-CM

## 2021-01-17 DIAGNOSIS — L97512 Non-pressure chronic ulcer of other part of right foot with fat layer exposed: Secondary | ICD-10-CM | POA: Diagnosis not present

## 2021-01-17 DIAGNOSIS — L97519 Non-pressure chronic ulcer of other part of right foot with unspecified severity: Secondary | ICD-10-CM

## 2021-01-17 DIAGNOSIS — B351 Tinea unguium: Secondary | ICD-10-CM | POA: Diagnosis not present

## 2021-01-17 DIAGNOSIS — I83015 Varicose veins of right lower extremity with ulcer other part of foot: Secondary | ICD-10-CM

## 2021-01-17 NOTE — Progress Notes (Signed)
.     Subjective:  87 y.o. female presenting today for follow up evaluation of an ulceration of the right foot.  Patient states that the nurses are no longer coming to the home.  Patient also is requesting a nail trim today.  Her nails are thick and long and she is unable to trim them herself.  They are becoming very symptomatic and painful in her shoes  Past Medical History:  Diagnosis Date   Cataract    Chronic congestive heart failure, unspecified heart failure type ([place]) 09/19/2019   Complete heart block (HCC)    s/p PPM implant (MDT) by Dr Blanch Media.  Atrial lead could not be paced at time of the procedure.  She has chronic AV dysociation   Delusional disorder ([place])    Exogenous obesity    Hypercholesterolemia    Invasive ductal carcinoma of breast ([place]) 03/2007   BILATERAL BREASTS   Orthostatic hypotension    treated with midodrine by Dr Rollene [name]   PONV (postoperative nausea and vomiting)    Thyroid disease    Venous insufficiency      Objective/Physical Exam General: The patient is alert and oriented x3 in no acute distress.  Dermatology:  Wound #1 noted to the right foot measuring 0.1 x 0.1 x 0.1 cm (LxWxD).  There continues to be improvement of the wound.There is a large skin tag noted to the area as well which is chronic in nature.  Hyperkeratotic dystrophic thickened nails noted 1-5 bilateral  Vascular: Palpable pedal pulses bilaterally. Capillary refill within normal limits. Varicosities noted bilateral lower extremities.   Neurological: Epicritic and protective threshold diminished bilaterally.   Musculoskeletal Exam: Advanced degenerative changes noted to the bilateral feet with arch collapse and progressive DJD given the patient's age.  Assessment: #1 ulceration of the right foot secondary to venous insufficiency #2 varicosities bilateral lower extremities #3 pain due to onychomycosis of toenails bilateral #4 preulcerative callus lesions bilateral   Plan of  Care:  #1 Patient was evaluated.  #2 medically necessary excisional debridement including subcutaneous tissue was performed using a tissue nipper and a chisel blade. Excisional debridement of all the necrotic nonviable tissue down to healthy bleeding viable tissue was performed with post-debridement measurements same as pre-.  Wound is mostly healed today. #3 the wound was cleansed with normal saline. #4 Dry sterile dressing applied.   #5 recommend light dressing daily.  Patient states that the home health nurse no longer comes to the house #6  Mechanical debridement of nails 1-5 bilateral was performed using a nail nipper without incident or bleeding  #7 excisional debridement of the hyperkeratotic preulcerative callus lesions was also performed using a 312 scalpel without incident or bleeding  #8 return to clinic in 3 months for routine foot care  Edrick Kins, DPM [facility]  Dr. Edrick Kins, DPM    [address], [place] [address]                Office [phone]  Fax [phone]

## 2021-03-06 DIAGNOSIS — H401132 Primary open-angle glaucoma, bilateral, moderate stage: Secondary | ICD-10-CM | POA: Diagnosis not present

## 2021-03-06 DIAGNOSIS — Z961 Presence of intraocular lens: Secondary | ICD-10-CM | POA: Diagnosis not present

## 2021-03-06 DIAGNOSIS — H43813 Vitreous degeneration, bilateral: Secondary | ICD-10-CM | POA: Diagnosis not present

## 2021-03-08 ENCOUNTER — Other Ambulatory Visit: Payer: Self-pay

## 2021-03-08 ENCOUNTER — Emergency Department (HOSPITAL_COMMUNITY): Payer: Medicare Other

## 2021-03-08 ENCOUNTER — Emergency Department (HOSPITAL_COMMUNITY)
Admission: EM | Admit: 2021-03-08 | Discharge: 2021-03-12 | Disposition: A | Discharge status: Home / Self Care | Payer: Medicare Other | Attending: Emergency Medicine | Admitting: Emergency Medicine

## 2021-03-08 ENCOUNTER — Encounter (HOSPITAL_COMMUNITY): Payer: Self-pay | Admitting: *Deleted

## 2021-03-08 DIAGNOSIS — Z853 Personal history of malignant neoplasm of breast: Secondary | ICD-10-CM | POA: Diagnosis not present

## 2021-03-08 DIAGNOSIS — I2510 Atherosclerotic heart disease of native coronary artery without angina pectoris: Secondary | ICD-10-CM | POA: Insufficient documentation

## 2021-03-08 DIAGNOSIS — I509 Heart failure, unspecified: Secondary | ICD-10-CM | POA: Insufficient documentation

## 2021-03-08 DIAGNOSIS — S52501A Unspecified fracture of the lower end of right radius, initial encounter for closed fracture: Secondary | ICD-10-CM | POA: Insufficient documentation

## 2021-03-08 DIAGNOSIS — M25531 Pain in right wrist: Secondary | ICD-10-CM | POA: Diagnosis not present

## 2021-03-08 DIAGNOSIS — W010XXA Fall on same level from slipping, tripping and stumbling without subsequent striking against object, initial encounter: Secondary | ICD-10-CM | POA: Insufficient documentation

## 2021-03-08 DIAGNOSIS — S62101A Fracture of unspecified carpal bone, right wrist, initial encounter for closed fracture: Secondary | ICD-10-CM

## 2021-03-08 DIAGNOSIS — R609 Edema, unspecified: Secondary | ICD-10-CM | POA: Diagnosis not present

## 2021-03-08 DIAGNOSIS — R0902 Hypoxemia: Secondary | ICD-10-CM | POA: Diagnosis not present

## 2021-03-08 DIAGNOSIS — R9431 Abnormal electrocardiogram [ECG] [EKG]: Secondary | ICD-10-CM | POA: Diagnosis not present

## 2021-03-08 DIAGNOSIS — E039 Hypothyroidism, unspecified: Secondary | ICD-10-CM | POA: Insufficient documentation

## 2021-03-08 DIAGNOSIS — Z8616 Personal history of COVID-19: Secondary | ICD-10-CM | POA: Insufficient documentation

## 2021-03-08 DIAGNOSIS — Z950 Presence of cardiac pacemaker: Secondary | ICD-10-CM | POA: Insufficient documentation

## 2021-03-08 DIAGNOSIS — S6991XA Unspecified injury of right wrist, hand and finger(s), initial encounter: Secondary | ICD-10-CM | POA: Diagnosis present

## 2021-03-08 DIAGNOSIS — S6291XA Unspecified fracture of right wrist and hand, initial encounter for closed fracture: Secondary | ICD-10-CM | POA: Diagnosis not present

## 2021-03-08 DIAGNOSIS — N183 Chronic kidney disease, stage 3 unspecified: Secondary | ICD-10-CM | POA: Diagnosis not present

## 2021-03-08 DIAGNOSIS — I959 Hypotension, unspecified: Secondary | ICD-10-CM | POA: Diagnosis not present

## 2021-03-08 DIAGNOSIS — Z20822 Contact with and (suspected) exposure to covid-19: Secondary | ICD-10-CM | POA: Insufficient documentation

## 2021-03-08 DIAGNOSIS — W19XXXA Unspecified fall, initial encounter: Secondary | ICD-10-CM | POA: Diagnosis not present

## 2021-03-08 DIAGNOSIS — Y9301 Activity, walking, marching and hiking: Secondary | ICD-10-CM | POA: Diagnosis not present

## 2021-03-08 LAB — CBC WITH DIFFERENTIAL/PLATELET
Abs Immature Granulocytes: 0.04 10*3/uL (ref 0.00–0.07)
Basophils Absolute: 0 10*3/uL (ref 0.0–0.1)
Basophils Relative: 0 %
Eosinophils Absolute: 0.3 10*3/uL (ref 0.0–0.5)
Eosinophils Relative: 3 %
HCT: 41.1 % (ref 36.0–46.0)
Hemoglobin: 13.3 g/dL (ref 12.0–15.0)
Immature Granulocytes: 0 %
Lymphocytes Relative: 10 %
Lymphs Abs: 0.9 10*3/uL (ref 0.7–4.0)
MCH: 32.1 pg (ref 26.0–34.0)
MCHC: 32.4 g/dL (ref 30.0–36.0)
MCV: 99.3 fL (ref 80.0–100.0)
Monocytes Absolute: 1 10*3/uL (ref 0.1–1.0)
Monocytes Relative: 10 %
Neutro Abs: 7.4 10*3/uL (ref 1.7–7.7)
Neutrophils Relative %: 77 %
Platelets: 240 10*3/uL (ref 150–400)
RBC: 4.14 MIL/uL (ref 3.87–5.11)
RDW: 12.9 % (ref 11.5–15.5)
WBC: 9.8 10*3/uL (ref 4.0–10.5)
nRBC: 0 % (ref 0.0–0.2)

## 2021-03-08 LAB — BASIC METABOLIC PANEL
Anion gap: 8 (ref 5–15)
BUN: 14 mg/dL (ref 8–23)
CO2: 26 mmol/L (ref 22–32)
Calcium: 8.4 mg/dL — ABNORMAL LOW (ref 8.9–10.3)
Chloride: 107 mmol/L (ref 98–111)
Creatinine, Ser: 1.14 mg/dL — ABNORMAL HIGH (ref 0.44–1.00)
GFR, Estimated: 45 mL/min — ABNORMAL LOW (ref >60)
Glucose, Bld: 91 mg/dL (ref 70–99)
Potassium: 3.6 mmol/L (ref 3.5–5.1)
Sodium: 141 mmol/L (ref 135–145)

## 2021-03-08 LAB — RESP PANEL BY RT-PCR (FLU A&B, COVID) ARPGX2
Influenza A by PCR: NEGATIVE
Influenza B by PCR: NEGATIVE
SARS Coronavirus 2 by RT PCR: NEGATIVE

## 2021-03-08 MED ORDER — FUROSEMIDE 20 MG PO TABS
10.0000 mg | ORAL_TABLET | Freq: Every day | ORAL | Status: DC
  Administered 2021-03-09 – 2021-03-12 (×4): 10 mg via ORAL
  Filled 2021-03-08 (×5): qty 1

## 2021-03-08 MED ORDER — MIDODRINE HCL 5 MG PO TABS
2.5000 mg | ORAL_TABLET | Freq: Every day | ORAL | Status: DC
  Administered 2021-03-09 – 2021-03-12 (×4): 2.5 mg via ORAL
  Filled 2021-03-08 (×5): qty 1

## 2021-03-08 MED ORDER — LEVOTHYROXINE SODIUM 25 MCG PO TABS: 50.0000 ug | ORAL_TABLET | Freq: Every day | ORAL | Status: DC

## 2021-03-08 MED ORDER — OXYCODONE HCL 5 MG PO TABS
5.0000 mg | ORAL_TABLET | Freq: Once | ORAL | Status: AC
  Administered 2021-03-08: 5 mg via ORAL
  Filled 2021-03-08: qty 1

## 2021-03-08 MED ORDER — FUROSEMIDE 20 MG PO TABS: 10.0000 mg | ORAL_TABLET | Freq: Every day | ORAL | Status: DC

## 2021-03-08 MED ORDER — LEVOTHYROXINE SODIUM 25 MCG PO TABS
50.0000 ug | ORAL_TABLET | Freq: Every day | ORAL | Status: DC
  Administered 2021-03-09 – 2021-03-12 (×4): 50 ug via ORAL
  Filled 2021-03-08: qty 2
  Filled 2021-03-08: qty 1
  Filled 2021-03-08 (×3): qty 2

## 2021-03-08 MED ORDER — TRAMADOL HCL 50 MG PO TABS: 50.0000 mg | ORAL_TABLET | Freq: Three times a day (TID) | ORAL | 0 refills | Status: DC | PRN

## 2021-03-08 MED ORDER — MIDODRINE HCL 5 MG PO TABS: 2.5000 mg | ORAL_TABLET | Freq: Every day | ORAL | Status: DC

## 2021-03-08 NOTE — Progress Notes (Signed)
CSW spoke with patient and patients POA at bedside. POA, [name] explained patient has had some weakness lately and he has encouraged patient to go to a SNF but she refused. POA stated he is a friend and is hoping with this fall that patient will understand she needs more assistance. POA mentioned [facility] but CSW stated she might have to refer to other facilities if they do not have a bed. POA understood. CSW asked what will be the discharge plan after SNF and he stated he was not sure. CSW encouraged them to apply for medicaid why at the facility in case they become interested in long term care. POA stated patient would like to go home after SNF but is hoping she might change her mind.

## 2021-03-08 NOTE — ED Provider Notes (Addendum)
[facility] EMERGENCY DEPARTMENT Provider Note   CSN: [id number] Arrival date & time: 03/08/21  4098     History Chief Complaint  Patient presents with   Lytle Michaels    [name] is a 87 y.o. female.  Patient tripped and fell while walking with a pot.  Landed on her right hand.  Did not hit her head or lose consciousness.  Pain and deformity to the right wrist.  Denies any other pain elsewhere.  Not on blood thinners.  The history is provided by the patient.  Wrist Pain This is a new problem. The current episode started 3 to 5 hours ago. The problem occurs constantly. The problem has not changed since onset.Pertinent negatives include no chest pain, no abdominal pain, no headaches and no shortness of breath. Nothing aggravates the symptoms. Nothing relieves the symptoms. She has tried nothing for the symptoms. The treatment provided no relief.      Past Medical History:  Diagnosis Date   Cataract    Chronic congestive heart failure, unspecified heart failure type ([place]) 09/19/2019   Complete heart block (HCC)    s/p PPM implant (MDT) by Dr Blanch Media.  Atrial lead could not be paced at time of the procedure.  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 ([place]) - R MCA s/p tPA and mechanical thrombectomy, unk embolic source 11/91/4782   (HFpEF) heart failure with preserved ejection fraction ([place]) 09/19/2019   Morbid obesity ([place]) 09/19/2019   CKD (chronic kidney disease), stage III ([place]) 09/17/2019   Spontaneous subarachnoid  hemorrhage ([place]) 10/04/2018   Unstable gait 01/19/2018   Venous stasis dermatitis of both lower extremities 01/09/2018   Diarrhea, chronic 01/09/2018   Lower extremity ulceration ([place]) 10/22/2017   Anxiety disorder 10/22/2017   PONV (postoperative nausea and vomiting)    Orthostatic hypotension    Hypercholesterolemia    Exogenous obesity    Rectal bleeding 03/30/2017   GI bleed 03/28/2017   BRBPR (bright red blood per rectum) 03/27/2017   Sigmoid diverticulitis 10/14/2016   Diverticulitis 10/14/2016   Diarrhea of presumed infectious origin 05/11/2016   Varicose veins of bilateral lower extremities with other complications 95/62/1308   Venous insufficiency 08/25/2015   Cellulitis 08/09/2015   Pressure ulcer 08/09/2015   CAD (coronary artery disease) 07/07/2015   Open wound of right foot 07/07/2015   Delusional disorder ([place]) 07/05/2015   Bereavement reaction 07/05/2015   Pacemaker at end of battery life 02/23/2015   Acute GI bleeding 02/15/2015   Acute kidney injury (nontraumatic) ([place]) 02/15/2015   Hypothyroidism 02/15/2015   Left upper quadrant pain    Osteopenia 12/22/2013   Postural dizziness 04/05/2013   S/P placement of cardiac pacemaker 02/26/2013   Bilateral breast cancer ([place]) 03/25/2011   Invasive ductal carcinoma of breast ([place]) 03/30/2007    Past Surgical History:  Procedure Laterality Date   ABDOMINAL HYSTERECTOMY     BREAST LUMPECTOMY Bilateral 2009   CATARACT EXTRACTION     EYE SURGERY X 2   CHOLECYSTECTOMY  COLONOSCOPY N/A 02/16/2015   Procedure: COLONOSCOPY;  Surgeon: Wilford Corner, MD;  Location[facility] ENDOSCOPY;  Service: Endoscopy;  Laterality: N/A;   EP IMPLANTABLE DEVICE N/A 02/23/2015   pacemaker generator change (MDT Sensia SR) by Dr Rayann Heman    ESOPHAGOGASTRODUODENOSCOPY N/A 02/16/2015   Procedure: ESOPHAGOGASTRODUODENOSCOPY (EGD);  Surgeon: Wilford Corner, MD;  Location: [facility] ENDOSCOPY;  Service: Endoscopy;  Laterality: N/A;   IR CT HEAD LTD   09/21/2019   IR PERCUTANEOUS ART THROMBECTOMY/INFUSION INTRACRANIAL INC DIAG ANGIO  09/21/2019   PACEMAKER INSERTION     MDT implanted by Dr Blanch Media.  Atrial lead placement was unsuccessful.  She has chronic AV dysociation   RADIOLOGY WITH ANESTHESIA N/A 09/21/2019   Procedure: IR WITH ANESTHESIA CODE STROKE;  Surgeon: Radiologist, Medication, MD;  Location: [place];  Service: Radiology;  Laterality: N/A;   remote right radical nephrectomy  2009     OB History   No obstetric history on file.     Family History  Problem Relation Age of Onset   Heart disease Maternal Grandmother    Heart disease Mother     Social History   Tobacco Use   Smoking status: Never   Smokeless tobacco: Never  Vaping Use   Vaping Use: Never used  Substance Use Topics   Alcohol use: No    Alcohol/week: 0.0 standard drinks   Drug use: No    Home Medications Prior to Admission medications   Medication Sig Start Date End Date Taking? Authorizing Provider  Ascorbic Acid (VITAMIN C PO) Take 1 tablet by mouth daily.   Yes [provider]  furosemide (LASIX) 20 MG tablet TAKE 1/2 TABLET DAILY Patient taking differently: Take 10 mg by mouth daily. 09/18/20  Yes [place], [name], MD  latanoprost (XALATAN) 0.005 % ophthalmic solution Place 1 drop into both eyes at bedtime. Patient taking differently: Place 1 drop into both eyes daily. 01/10/20  Yes [place], [name], MD  midodrine (PROAMATINE) 2.5 MG tablet Take 1 tablet (2.5 mg total) by mouth daily after breakfast. 01/10/20  Yes [place], [name], MD  SYNTHROID 50 MCG tablet TAKE 1 TABLET DAILY BEFORE BREAKFAST Patient taking differently: Take 50 mcg by mouth daily before breakfast. 01/05/21  Yes [place], [name], MD    Allergies    Amoxicillin-pot clavulanate, Erythromycin, Tape, Codeine, Doxycycline, Flagyl [metronidazole], Macrobid [nitrofurantoin macrocrystal], Tolterodine, and Ciprofloxacin  Review of Systems   Review of Systems  Constitutional:   Negative for chills and fever.  HENT:  Negative for ear pain and sore throat.   Eyes:  Negative for pain and visual disturbance.  Respiratory:  Negative for cough and shortness of breath.   Cardiovascular:  Negative for chest pain and palpitations.  Gastrointestinal:  Negative for abdominal pain and vomiting.  Genitourinary:  Negative for dysuria and hematuria.  Musculoskeletal:  Positive for arthralgias. Negative for back pain.  Skin:  Negative for color change, pallor, rash and wound.  Neurological:  Negative for seizures, syncope, weakness, numbness and headaches.  All other systems reviewed and are negative.  Physical Exam Updated Vital Signs   ED Triage Vitals  Enc Vitals Group     BP 03/08/21 0730 (!) 138/100     Pulse Rate 03/08/21 0730 61     Resp --      Temp --      Temp src --      SpO2 03/08/21 0730 99 %     Weight 03/08/21 0541 250 lb (113.4 kg)  Height 03/08/21 0541 5' 7.5" (1.715 m)     Head Circumference --      Peak Flow --      Pain Score 03/08/21 0541 0     Pain Loc --      Pain Edu? --      Excl. in [place]? --     Physical Exam Vitals and nursing note reviewed.  Constitutional:      General: She is not in acute distress.    Appearance: She is well-developed.  HENT:     Head: Normocephalic and atraumatic.     Nose: Nose normal.     Mouth/Throat:     Mouth: Mucous membranes are moist.  Eyes:     Extraocular Movements: Extraocular movements intact.     Conjunctiva/sclera: Conjunctivae normal.     Pupils: Pupils are equal, round, and reactive to light.  Neck:     Comments: No midline spinal tenderness Cardiovascular:     Rate and Rhythm: Normal rate and regular rhythm.     Pulses: Normal pulses.     Heart sounds: Normal heart sounds. No murmur heard. Pulmonary:     Effort: Pulmonary effort is normal. No respiratory distress.     Breath sounds: Normal breath sounds.  Abdominal:     Palpations: Abdomen is soft.     Tenderness: There is no  abdominal tenderness.  Musculoskeletal:        General: Tenderness and deformity (right wrist) present. No swelling.     Cervical back: Normal range of motion and neck supple. No tenderness.     Comments: No midline spinal tenderness, tenderness to the right wrist with very subtle deformity, decreased range of motion secondary to pain  Skin:    General: Skin is warm and dry.  Neurological:     General: No focal deficit present.     Mental Status: She is alert and oriented to person, place, and time.     Cranial Nerves: No cranial nerve deficit.     Sensory: No sensory deficit.     Motor: No weakness.     Coordination: Coordination normal.     Comments: Motor function appears to be intact in the right upper extremity including her fingers, sensation intact in the right upper extremity and throughout as well.  Psychiatric:        Mood and Affect: Mood normal.    ED Results / Procedures / Treatments   Labs (all labs ordered are listed, but only abnormal results are displayed) Labs Reviewed  RESP PANEL BY RT-PCR (FLU A&B, COVID) ARPGX2  CBC WITH DIFFERENTIAL/PLATELET  BASIC METABOLIC PANEL    EKG None  Radiology DG Wrist Complete Right  Result Date: 03/08/2021 CLINICAL DATA:  Fall. EXAM: RIGHT WRIST - COMPLETE 3+ VIEW COMPARISON:  None. FINDINGS: Acute transverse fracture through the distal radial metaphysis with mild posterior displacement measuring 2-3 mm. Mild radial sided impaction. Located wrist. STT osteoarthritis. IMPRESSION: Mildly displaced and impacted distal radius fracture. Electronically Signed   By: Jorje Guild M.D.   On: 03/08/2021 06:14    Procedures Procedures   Medications Ordered in ED Medications  furosemide (LASIX) tablet 10 mg (has no administration in time range)  levothyroxine (SYNTHROID) tablet 50 mcg (has no administration in time range)  midodrine (PROAMATINE) tablet 2.5 mg (has no administration in time range)  oxyCODONE (Oxy IR/ROXICODONE)  immediate release tablet 5 mg (5 mg Oral Given 03/08/21 0900)    ED Course  I have reviewed the triage  vital signs and the nursing notes.  Pertinent labs & imaging results that were available during my care of the patient were reviewed by me and considered in my medical decision making (see chart for details).    MDM Rules/Calculators/A&P                           DELVINA MIZZELL is a 87 year old female who presents to the ED after she fell injuring her right wrist.  Patient did not hit her head or lose consciousness.  She is not on blood thinners.  She has pain and some mild deformity to the right wrist.  X-ray showed mildly displaced and impacted distal radius fracture.  Talk to Hilbert Odor with orthopedics who recommended splint and outpatient follow-up with Dr. Fredna Dow.  Suspect that this may be nonoperative.  The patient does live by herself.  This is her dominant hand.  She has no family or support at home.  Her power of attorney is on nurse who works across the street supposedly.  I was able to talk on the phone with him.  He was already concerned about her living on her own before this fall today.  Overall will reach out to social work and case management about possible placement.  We will have physical therapy evaluate her.  We will get medical screening labs.  Anticipate placement to short-term living facility.  Right now patient is awaiting social work and case management placement.  We will follow-up with orthopedics outpatient next week.  Patient evaluated by PT.  Recommending short-term placement.  Social work/case management have been able to find a place for her.  Anticipating that she will be able to go later today or tomorrow.  Home medications have been ordered.  This chart was dictated using voice recognition software.  Despite best efforts to proofread,  errors can occur which can change the documentation meaning.   Final Clinical Impression(s) / ED Diagnoses Final  diagnoses:  Closed fracture of right wrist, initial encounter    Rx / DC Orders ED Discharge Orders     None        Lennice Sites, DO 03/08/21 [place], [place], DO 03/08/21 1219

## 2021-03-08 NOTE — Progress Notes (Signed)
Orthopedic Tech Progress Note Patient Details:  [name] 06/14/1927 [id number]  Ortho Devices Type of Ortho Device: Sugartong splint Ortho Device/Splint Location: RUE Ortho Device/Splint Interventions: Ordered, Application   Post Interventions Patient Tolerated: Well Instructions Provided: Care of device  [name] 03/08/2021, 11:06 AM

## 2021-03-08 NOTE — ED Notes (Signed)
Help patient to the bedside toilet patient did well patient is now back in bed on the monitor with call bell in reach  

## 2021-03-08 NOTE — ED Notes (Signed)
POA [name]  was notified at patient request.

## 2021-03-08 NOTE — ED Triage Notes (Signed)
Patient states she was coming back from the bathroom and had a pot in her hand and fell, denies being dizzy states she just fell, c/o pain in the right wrist. Denies anyother injuries.

## 2021-03-08 NOTE — ED Notes (Signed)
Called ortho to place short arm splint

## 2021-03-08 NOTE — Evaluation (Signed)
Physical Therapy Evaluation Patient Details Name: [name] MRN: [id number] DOB: 06-01-1927 Today's Date: 03/08/2021  History of Present Illness  Patient to ED by EMS on 11/10 after fall at home resulting in right wrist injury resulting in right distal radius fx. Non operative management in sling. PMH:CHF, CHB, pacemaker, Orthostatic hypotension, CVA, anxiety  Clinical Impression  Pt admitted with above diagnosis. Pt currently needing mod assist for all aspects of mobility. Injured her dominant hand.  Will need SNF for rehab to work on balance and endurance trainning prior to d/c home.  Pt currently with functional limitations due to the deficits listed below (see PT Problem List). Pt will benefit from skilled PT to increase their independence and safety with mobility to allow discharge to the venue listed below.          Recommendations for follow up therapy are one component of a multi-disciplinary discharge planning process, led by the attending physician.  Recommendations may be updated based on patient status, additional functional criteria and insurance authorization.  Follow Up Recommendations Skilled nursing-short term rehab (<3 hours/day)    Assistance Recommended at Discharge Frequent or constant Supervision/Assistance  Functional Status Assessment Patient has had a recent decline in their functional status and demonstrates the ability to make significant improvements in function in a reasonable and predictable amount of time.  Equipment Recommendations  Other (comment) (TBA)    Recommendations for Other Services       Precautions / Restrictions Precautions Precautions: Fall Required Braces or Orthoses: Sling Restrictions Weight Bearing Restrictions: Yes RUE Weight Bearing: Non weight bearing      Mobility  Bed Mobility Overal bed mobility: Needs Assistance Bed Mobility: Supine to Sit     Supine to sit: Min assist;Mod assist     General bed mobility  comments: mod cues and assist to come to edge of stretcher.    Transfers Overall transfer level: Needs assistance Equipment used: 1 person hand held assist Transfers: Sit to/from Stand;Bed to chair/wheelchair/BSC Sit to Stand: Mod assist;From elevated surface Stand pivot transfers: Mod assist;From elevated surface         General transfer comment: Pt needed mod assist for power up with cues for hand placement. Pt reaching for anything around her due to feeling unsteady. Constant need for left UE support. Pivoted to 3N1 and urinated first.  Assiost needing to pull panties down. Needed assist to wipe but was able to help with pulling up panties.    Ambulation/Gait Ambulation/Gait assistance: Mod assist Gait Distance (Feet): 8 Feet Assistive device: 1 person hand held assist Gait Pattern/deviations: Step-to pattern;Decreased step length - left;Decreased stance time - left;Decreased weight shift to left;Trunk flexed;Wide base of support;Antalgic;Staggering right;Staggering left   Gait velocity interpretation: <1.31 ft/sec, indicative of household ambulator   General Gait Details: Pt very unsteady on her feet with left UE support only. Pt must have left UE support for balance as well.  Pt with noted weakness left LE stepping that LE with short step length as well as does not weight bear as much on left side.  Noted drainage bil LEs left worse than right.  Stairs            Wheelchair Mobility    Modified Rankin (Stroke Patients Only)       Balance Overall balance assessment: Needs assistance Sitting-balance support: No upper extremity supported;Feet supported Sitting balance-Leahy Scale: Fair     Standing balance support: Single extremity supported;Reliant on assistive device for balance;During functional activity Standing balance-Leahy  Scale: Poor Standing balance comment: relies on left UE support and external support                             Pertinent  Vitals/Pain Pain Assessment: Faces Faces Pain Scale: Hurts even more Pain Location: right UE Pain Descriptors / Indicators: Aching;Grimacing;Guarding Pain Intervention(s): Limited activity within patient's tolerance;Monitored during session;Repositioned    Home Living Family/patient expects to be discharged to:: Private residence Living Arrangements: Alone Available Help at Discharge: Available PRN/intermittently (POA prn) Type of Home: House Home Access: Stairs to enter Entrance Stairs-Rails: Psychiatric nurse of Steps: 4   Home Layout: One level Home Equipment: Conservation officer, nature (2 wheels);Cane - single point;BSC/3in1      Prior Function Prior Level of Function : Independent/Modified Independent;Driving;History of Falls (last six months);Needs assist  Cognitive Assist : Mobility (cognitive);ADLs (cognitive)     Physical Assist : Mobility (physical);ADLs (physical)     Mobility Comments: Did all on her own, 2 falls in last 6 months including this one ADLs Comments: No assist needed     Hand Dominance   Dominant Hand: Right    Extremity/Trunk Assessment   Upper Extremity Assessment Upper Extremity Assessment: Defer to OT evaluation    Lower Extremity Assessment Lower Extremity Assessment: Generalized weakness    Cervical / Trunk Assessment Cervical / Trunk Assessment: Kyphotic  Communication   Communication: No difficulties  Cognition Arousal/Alertness: Awake/alert Behavior During Therapy: WFL for tasks assessed/performed Overall Cognitive Status: Within Functional Limits for tasks assessed                                          General Comments General comments (skin integrity, edema, etc.): bil LEs draining    Exercises     Assessment/Plan    PT Assessment Patient needs continued PT services  PT Problem List Decreased activity tolerance;Decreased balance;Decreased mobility;Decreased knowledge of use of DME;Decreased  safety awareness;Decreased knowledge of precautions;Decreased strength       PT Treatment Interventions DME instruction;Gait training;Functional mobility training;Therapeutic activities;Therapeutic exercise;Balance training;Patient/family education;Stair training    PT Goals (Current goals can be found in the Care Plan section)  Acute Rehab PT Goals Patient Stated Goal: to go to Reehab PT Goal Formulation: With patient Time For Goal Achievement: 03/22/21 Potential to Achieve Goals: Good    Frequency Min 3X/week   Barriers to discharge Decreased caregiver support      Co-evaluation               AM-PAC PT "6 Clicks" Mobility  Outcome Measure Help needed turning from your back to your side while in a flat bed without using bedrails?: A Lot Help needed moving from lying on your back to sitting on the side of a flat bed without using bedrails?: A Lot Help needed moving to and from a bed to a chair (including a wheelchair)?: A Lot Help needed standing up from a chair using your arms (e.g., wheelchair or bedside chair)?: A Lot Help needed to walk in hospital room?: A Lot Help needed climbing 3-5 steps with a railing? : Total 6 Click Score: 11    End of Session Equipment Utilized During Treatment: Gait belt Activity Tolerance: Patient limited by fatigue Patient left: in bed;with call bell/phone within reach (stretcher) Nurse Communication: Mobility status PT Visit Diagnosis: Unsteadiness on feet (R26.81);Muscle weakness (generalized) (M62.81)  Time: 3817-7116 PT Time Calculation (min) (ACUTE ONLY): 24 min   Charges:   PT Evaluation $PT Eval Moderate Complexity: 1 Mod PT Treatments $Gait Training: 8-22 mins        Mariah Harn M,PT Acute Rehab Services [phone] (pager)   Alvira Philips 03/08/2021, 2:13 PM

## 2021-03-08 NOTE — Evaluation (Signed)
Occupational Therapy Evaluation Patient Details Name: [name] MRN: [id number] DOB: 1927/09/06 Today's Date: 03/08/2021   History of Present Illness Patient to ED by EMS on 11/10 after fall at home resulting in right wrist injury resulting in right distal radius fx. Non operative management in sling. PMH:CHF, CHB, pacemaker, Orthostatic hypotension, CVA, anxiety   Clinical Impression   Patient admitted for the above diagnosis and injuries.  PTA she lives alone, continues to drive, care for her meds, walks without an AD, and sponge bathes without assist.  Deficits impacting independence are listed below.  Currently she is needing up to Mod A of 1 for basic mobility and transfers, and up to Max A for ADL completion from a sit/stand level.  Given no assist at home, and the need for 24 hour heavy assist, OT recommends short term rehab at a local SNF prior to returning home.  OT will follow in the acute setting to maximize her functional status.         Recommendations for follow up therapy are one component of a multi-disciplinary discharge planning process, led by the attending physician.  Recommendations may be updated based on patient status, additional functional criteria and insurance authorization.   Follow Up Recommendations  Skilled nursing-short term rehab (<3 hours/day)    Assistance Recommended at Discharge Frequent or constant Supervision/Assistance  Functional Status Assessment  Patient has had a recent decline in their functional status and demonstrates the ability to make significant improvements in function in a reasonable and predictable amount of time.  Equipment Recommendations  BSC/3in1    Recommendations for Other Services       Precautions / Restrictions Precautions Precautions: Fall Required Braces or Orthoses: Sling;Splint/Cast Splint/Cast: long arm sugar-tong Splint/Cast - Date Prophylactic Dressing Applied (if applicable): 27/74/12 Restrictions Weight  Bearing Restrictions: Yes RUE Weight Bearing: Non weight bearing      Mobility Bed Mobility Overal bed mobility: Needs Assistance Bed Mobility: Supine to Sit;Sit to Supine     Supine to sit: Min assist;Mod assist Sit to supine: Mod assist   General bed mobility comments: mod cues and assist to come to edge of stretcher.    Transfers Overall transfer level: Needs assistance Equipment used: 1 person hand held assist Transfers: Sit to/from Stand Sit to Stand: Mod assist Stand pivot transfers: Mod assist;From elevated surface         General transfer comment: side steps to [facility] with Min A      Balance Overall balance assessment: Needs assistance Sitting-balance support: No upper extremity supported;Feet supported Sitting balance-Leahy Scale: Good     Standing balance support: Single extremity supported;During functional activity Standing balance-Leahy Scale: Poor Standing balance comment: relies on left UE support and external support                           ADL either performed or assessed with clinical judgement   ADL Overall ADL's : Needs assistance/impaired Eating/Feeding: Set up;Sitting   Grooming: Wash/dry hands;Wash/dry face;Minimal assistance;Sitting   Upper Body Bathing: Moderate assistance;Sitting   Lower Body Bathing: Maximal assistance;Sit to/from stand   Upper Body Dressing : Sitting;Moderate assistance   Lower Body Dressing: Maximal assistance;Sit to/from stand   Toilet Transfer: Moderate assistance Toilet Transfer Details (indicate cue type and reason): HHA Toileting- Clothing Manipulation and Hygiene: Moderate assistance;Sit to/from stand               Vision Baseline Vision/History: 0 No visual deficits Patient Visual  Report: No change from baseline       Perception Perception Perception: Not tested   Praxis Praxis Praxis: Not tested    Pertinent Vitals/Pain Faces Pain Scale: Hurts a little bit Pain Location: right  UE and low back from lying all day Pain Descriptors / Indicators: Aching;Grimacing;Guarding Pain Intervention(s): Monitored during session;Repositioned     Hand Dominance Right   Extremity/Trunk Assessment Upper Extremity Assessment Upper Extremity Assessment: RUE deficits/detail RUE Deficits / Details: splinted and in a sling - no ortho consults in the chart as of eval RUE: Unable to fully assess due to pain;Unable to fully assess due to immobilization   Lower Extremity Assessment Lower Extremity Assessment: Defer to PT evaluation   Cervical / Trunk Assessment Cervical / Trunk Assessment: Kyphotic   Communication Communication Communication: No difficulties   Cognition Arousal/Alertness: Awake/alert Behavior During Therapy: WFL for tasks assessed/performed Overall Cognitive Status: Within Functional Limits for tasks assessed                                       General Comments  bil LEs draining    Exercises     Shoulder Instructions      Home Living Family/patient expects to be discharged to:: Private residence Living Arrangements: Alone Available Help at Discharge: Available PRN/intermittently Type of Home: House Home Access: Stairs to enter [employer] of Steps: 4 Entrance Stairs-Rails: Right;Left Home Layout: One level     Bathroom Shower/Tub: Sponge bathes at baseline   [employer]: Standard Bathroom Accessibility: Yes How Accessible: Accessible via walker Home Equipment: Rolling Walker (2 wheels);Cane - single point;BSC/3in1          Prior Functioning/Environment Prior Level of Function : Independent/Modified Independent;Driving;History of Falls (last six months);Needs assist               ADLs Comments: No assist needed for ADL and IADL.        OT Problem List: Decreased strength;Decreased range of motion;Decreased activity tolerance;Impaired balance (sitting and/or standing);Decreased knowledge of use of  DME or AE;Decreased knowledge of precautions;Pain;Impaired UE functional use;Increased edema      OT Treatment/Interventions: Self-care/ADL training;Therapeutic exercise;Balance training;Therapeutic activities;DME and/or AE instruction    OT Goals(Current goals can be found in the care plan section) Acute Rehab OT Goals Patient Stated Goal: I want to sit up OT Goal Formulation: With patient Time For Goal Achievement: 03/22/21 Potential to Achieve Goals: Good ADL Goals Pt Will Perform Grooming: with set-up;standing Pt Will Perform Upper Body Bathing: with supervision;sitting Pt Will Perform Upper Body Dressing: with supervision;sitting Pt Will Transfer to Toilet: with supervision;ambulating;regular height toilet Pt Will Perform Toileting - Clothing Manipulation and hygiene: with supervision;sit to/from stand Pt/caregiver will Perform Home Exercise Program: Increased ROM;Right Upper extremity;With minimal assist  OT Frequency: Min 2X/week   Barriers to D/C: Decreased caregiver support          Co-evaluation              AM-PAC OT "6 Clicks" Daily Activity     Outcome Measure Help from another person eating meals?: A Little Help from another person taking care of personal grooming?: A Little Help from another person toileting, which includes using toliet, bedpan, or urinal?: A Lot Help from another person bathing (including washing, rinsing, drying)?: A Lot Help from another person to put on and taking off regular upper body clothing?: A Lot Help from another person  to put on and taking off regular lower body clothing?: A Lot 6 Click Score: 14   End of Session Nurse Communication: Mobility status  Activity Tolerance: Patient tolerated treatment well Patient left: in bed;with call bell/phone within reach  OT Visit Diagnosis: Unsteadiness on feet (R26.81);Muscle weakness (generalized) (M62.81);Pain Pain - Right/Left: Right Pain - part of body: Hand                Time:  1640-1700 OT Time Calculation (min): 20 min Charges:  OT General Charges $OT Visit: 1 Visit OT Evaluation $OT Eval Moderate Complexity: 1 Mod  03/08/2021  RP, OTR/L  Acute Rehabilitation Services  Office:  [phone]   Metta Clines 03/08/2021, 5:11 PM

## 2021-03-08 NOTE — Progress Notes (Signed)
SNF referrals sent to [facility] and other facilities.

## 2021-03-08 NOTE — ED Provider Notes (Signed)
MSE was initiated and I personally evaluated the patient and placed orders (if any) at  5:34 AM on March 08, 2021.  Patient to ED by EMS after fall at home resulting in right wrist injury. Not anticoagulated. She was carrying a pot and tripped landing on extended right wrist. No head injury, neck/chest/abdomen/pelvic pain.   Moves all extremities in full range except for splinted right wrist.  No midline cervical tenderness No chest tenderness No abdominal tenderness. No pelvic tenderness.  The patient appears stable so that the remainder of the MSE may be completed by another provider.   Charlann Lange, PA-C 03/08/21 0536    Merrily Pew, MD 03/08/21 [phone]

## 2021-03-08 NOTE — Discharge Instructions (Signed)
Follow-up with Dr. Fredna Dow next week.  Recommend ice, Tylenol for pain.  Keep your splint dry.  Use your sling for comfort.

## 2021-03-08 NOTE — NC FL2 (Signed)
[place] LEVEL OF CARE SCREENING TOOL     IDENTIFICATION  Patient Name: [name] Birthdate: 1927-07-12 Sex: female Admission Date (Current Location): 03/08/2021  [facility] and [place] Number:  Herbalist and Address:  [facility] [place] [place], [place] [address], [place], [place] [address]      Provider Number: [id number]  Attending Physician Name and Address:  Lennice Sites, DO  Relative Name and Phone Number:  [name], [phone]    Current Level of Care: SNF Recommended Level of Care: [place] Prior Approval Number:    Date Approved/Denied:   PASRR Number: [id number]  Discharge Plan: SNF    Current Diagnoses: Patient Active Problem List   Diagnosis Date Noted   COVID-19 virus detected 09/23/2019   Stroke aborted by administration of thrombolytic agent ([place]) - R MCA s/p tPA and mechanical thrombectomy, unk embolic source 62/69/4854   (HFpEF) heart failure with preserved ejection fraction ([place]) 09/19/2019   Morbid obesity ([place]) 09/19/2019   CKD (chronic kidney disease), stage III ([place]) 09/17/2019   Spontaneous subarachnoid hemorrhage ([place]) 10/04/2018   Unstable gait 01/19/2018   Venous stasis dermatitis of both lower extremities 01/09/2018   Diarrhea, chronic 01/09/2018   Lower extremity ulceration ([place]) 10/22/2017   Anxiety disorder 10/22/2017   PONV (postoperative nausea and vomiting)    Orthostatic hypotension    Hypercholesterolemia    Exogenous obesity    Rectal bleeding 03/30/2017   GI bleed 03/28/2017   BRBPR (bright red blood per rectum) 03/27/2017   Sigmoid diverticulitis 10/14/2016   Diverticulitis 10/14/2016   Diarrhea of presumed infectious origin 05/11/2016   Varicose veins of bilateral lower extremities with other complications 62/70/3500   Venous insufficiency 08/25/2015   Cellulitis 08/09/2015   Pressure ulcer 08/09/2015   CAD (coronary artery disease) 07/07/2015   Open wound of  right foot 07/07/2015   Delusional disorder ([place]) 07/05/2015   Bereavement reaction 07/05/2015   Pacemaker at end of battery life 02/23/2015   Acute GI bleeding 02/15/2015   Acute kidney injury (nontraumatic) ([place]) 02/15/2015   Hypothyroidism 02/15/2015   Left upper quadrant pain    Osteopenia 12/22/2013   Postural dizziness 04/05/2013   S/P placement of cardiac pacemaker 02/26/2013   Bilateral breast cancer ([place]) 03/25/2011   Invasive ductal carcinoma of breast ([place]) 03/30/2007    Orientation RESPIRATION BLADDER Height & Weight     Self, Time, Situation, Place  Normal Continent Weight: 250 lb (113.4 kg) Height:  5' 7.5" (171.5 cm)  BEHAVIORAL SYMPTOMS/MOOD NEUROLOGICAL BOWEL NUTRITION STATUS      Continent    AMBULATORY STATUS COMMUNICATION OF NEEDS Skin   Extensive Assist Verbally Normal                       Personal Care Assistance Level of Assistance  Bathing, Dressing, Feeding Bathing Assistance: Maximum assistance Feeding assistance: Limited assistance Dressing Assistance: Maximum assistance     Functional Limitations Info  Sight, Hearing, Speech Sight Info: Impaired Hearing Info: Adequate Speech Info: Adequate    SPECIAL CARE FACTORS FREQUENCY                       Contractures Contractures Info: Not present    Additional Factors Info  Code Status, Allergies Code Status Info: Prior, full code Allergies Info: Codeine, Doxycycline Flagyl (metronidazole), Macrobid (nitrofurantoin Macrocrystal), Tolterodine, Ciprofloxacin, Amoxicillin-pot Clavulanate, Erythromycin, Tape           Current  Medications (03/08/2021):  This is the current hospital active medication list Current Facility-Administered Medications  Medication Dose Route Frequency Provider Last Rate Last Admin   [START ON 03/09/2021] furosemide (LASIX) tablet 10 mg  10 mg Oral Daily Curatolo, Adam, DO       [START ON 03/09/2021] levothyroxine (SYNTHROID) tablet 50 mcg  50 mcg Oral  Q0600 Curatolo, Adam, DO       [START ON 03/09/2021] midodrine (PROAMATINE) tablet 2.5 mg  2.5 mg Oral QPC breakfast Lennice Sites, DO       Current Outpatient Medications  Medication Sig Dispense Refill   Ascorbic Acid (VITAMIN C PO) Take 1 tablet by mouth daily.     furosemide (LASIX) 20 MG tablet TAKE 1/2 TABLET DAILY (Patient taking differently: Take 10 mg by mouth daily.) 45 tablet 1   latanoprost (XALATAN) 0.005 % ophthalmic solution Place 1 drop into both eyes at bedtime. (Patient taking differently: Place 1 drop into both eyes daily.) 2.5 mL 10   midodrine (PROAMATINE) 2.5 MG tablet Take 1 tablet (2.5 mg total) by mouth daily after breakfast. 90 tablet 2   SYNTHROID 50 MCG tablet TAKE 1 TABLET DAILY BEFORE BREAKFAST (Patient taking differently: Take 50 mcg by mouth daily before breakfast.) 90 tablet 0   traMADol (ULTRAM) 50 MG tablet Take 1 tablet (50 mg total) by mouth every 8 (eight) hours as needed for up to 10 doses for severe pain. 10 tablet 0     Discharge Medications: Please see discharge summary for a list of discharge medications.  Relevant Imaging Results:  Relevant Lab Results:   Additional Information SSN # [id number], 2 covid shots  Raina Mina, LCSWA

## 2021-03-09 DIAGNOSIS — S52501A Unspecified fracture of the lower end of right radius, initial encounter for closed fracture: Secondary | ICD-10-CM | POA: Diagnosis not present

## 2021-03-09 NOTE — ED Notes (Signed)
Pt placed on hospital bed at this time.  

## 2021-03-09 NOTE — Progress Notes (Signed)
[facility] denied patient. Family willing to move forward with [facility]. [facility] is not able to take patient until Monday.

## 2021-03-09 NOTE — ED Notes (Signed)
Assisted pt to bedside commode where she was continent of loose stool. Placed pt in fresh brief, changed incontinence pad and bottom sheet on bed, and reconnected pt to monitor.

## 2021-03-10 DIAGNOSIS — S52501A Unspecified fracture of the lower end of right radius, initial encounter for closed fracture: Secondary | ICD-10-CM | POA: Diagnosis not present

## 2021-03-10 NOTE — ED Notes (Addendum)
Assisted to [facility], A/Ox4, able to stand and pivot to [facility] without assist. Left lower ;eg with dressing in place. Right arm remains in splint with sling in place.

## 2021-03-10 NOTE — ED Notes (Signed)
Assumed care of pt at this time. Pt lying in bed with eyes closed. Respirations even and unlabored. NAD noted, will continue to monitor.

## 2021-03-10 NOTE — Consult Note (Signed)
WOC Nurse Consult Note: Reason for Consult:Patient is s/p fall at home with new area of weeping partial thickness skin loss on left pretibial area Wound type:trauma vs venous insufficiency Pressure Injury POA: Yes/No/NA Measurement: Total area of involvement to be measured by bedside RN and documented on nursing flow sheet Drainage (amount, consistency, odor) serous exudate Dressing procedure/placement/frequency: I have provided Nursing with conservative guidance for the care of this area using a daily soap and water cleanse followed by a rinse and pat dry. Topical care will be with folded pieces of antimicrobial nonadherent dressings (xeroform, Lawson # [id number]) topped with an ABD pad and secured with a few turns of Kerlix roll gauze/paper tape.  [place] nursing team will not follow, but will remain available to this patient, the nursing and medical teams.  Please re-consult if needed. Thanks, Maudie Flakes, MSN, RN, Ruth, Arther Abbott  Pager# [phone]

## 2021-03-10 NOTE — ED Notes (Signed)
Left shin has multiple grouped, oozing wounds with a moderate amount of purulent/serosanguineous drainage. RN dressed with ABD pad and Kerlix gauze, wrapping with 4" ace wrap. Placed WOC consult for continued wound maintenance eval.

## 2021-03-10 NOTE — ED Notes (Signed)
Assisted to recliner for comfort.

## 2021-03-10 NOTE — ED Notes (Signed)
Assisted to bed from recliner, able to walk to bed.

## 2021-03-10 NOTE — Progress Notes (Signed)
Occupational Therapy Treatment Patient Details Name: [name] MRN: [id number] DOB: 15-Mar-1928 Today's Date: 03/10/2021   History of present illness Patient to ED by EMS on 11/10 after fall at home resulting in right wrist injury resulting in right distal radius fx. Non operative management in sling. PMH:CHF, CHB, pacemaker, Orthostatic hypotension, CVA, anxiety   OT comments  Patient with good progress toward patient focused goals.  Able to sit EOB with supervision, and transfer to recliner to [place].  Patient with good follow through of HEP established prior session.  Continues to A/PROM to fingers on R hand, and move her shoulder with activity.  OT will continue to follow in the acute setting, but SNF for post acute rehab is recommended to maximize her functional status for a safe return home eventually.     Recommendations for follow up therapy are one component of a multi-disciplinary discharge planning process, led by the attending physician.  Recommendations may be updated based on patient status, additional functional criteria and insurance authorization.    Follow Up Recommendations  Skilled nursing-short term rehab (<3 hours/day)    Assistance Recommended at Discharge Frequent or constant Supervision/Assistance  Equipment Recommendations  BSC/3in1    Recommendations for Other Services      Precautions / Restrictions Precautions Precautions: Fall Required Braces or Orthoses: Sling;Splint/Cast Splint/Cast: long arm sugar-tong Restrictions Weight Bearing Restrictions: Yes RUE Weight Bearing: Non weight bearing       Mobility Bed Mobility Overal bed mobility: Needs Assistance Bed Mobility: Supine to Sit     Supine to sit: Min assist;HOB elevated          Transfers Overall transfer level: Needs assistance Equipment used: 1 person hand held assist Transfers: Sit to/from Stand;Bed to chair/wheelchair/BSC Sit to Stand: Min assist   Step pivot transfers: Min  assist             Balance Overall balance assessment: Needs assistance Sitting-balance support: No upper extremity supported;Feet supported Sitting balance-Leahy Scale: Good     Standing balance support: Single extremity supported;During functional activity Standing balance-Leahy Scale: Poor Standing balance comment: relies on left UE support and external support                           ADL either performed or assessed with clinical judgement   ADL   Eating/Feeding: Set up;Sitting   Grooming: Wash/dry hands;Wash/dry face;Minimal assistance;Sitting               Lower Body Dressing: Maximal assistance;Sit to/from stand   Toilet Transfer: Minimal [employer] production planner Details (indicate cue type and reason): HHA Toileting- Clothing Manipulation and Hygiene: Moderate assistance;Sit to/from stand              Extremity/Trunk Assessment Upper Extremity Assessment Upper Extremity Assessment: RUE deficits/detail RUE Deficits / Details: splinted and in a sling - no ortho consults in the chart as of eval RUE: Unable to fully assess due to immobilization RUE Sensation: WNL RUE Coordination: WNL   Lower Extremity Assessment Lower Extremity Assessment: Defer to PT evaluation   Cervical / Trunk Assessment Cervical / Trunk Assessment: Kyphotic    Vision Baseline Vision/History: 0 No visual deficits Patient Visual Report: No change from baseline     Perception Perception Perception: Not tested   Praxis Praxis Praxis: Not tested    Cognition Arousal/Alertness: Awake/alert Behavior During Therapy: WFL for tasks assessed/performed Overall Cognitive Status: No family/caregiver present to determine baseline cognitive functioning  General Comments: Patient with odd comments this date: "my family has a listening device on me", "they steal everything from me", "the police won't listen." ? Baseline  cognitive dysfunction.                              Pertinent Vitals/ Pain       Faces Pain Scale: Hurts a little bit Pain Location: right UE and low back from lying all day Pain Descriptors / Indicators: Aching;Grimacing;Guarding Pain Intervention(s): Monitored during session                                                          Frequency  Min 2X/week        Progress Toward Goals  OT Goals(current goals can now be found in the care plan section)  Progress towards OT goals: Progressing toward goals  Acute Rehab OT Goals Patient Stated Goal: I need to get back home OT Goal Formulation: With patient Time For Goal Achievement: 03/22/21 Potential to Achieve Goals: Good  Plan Discharge plan remains appropriate    Co-evaluation                 AM-PAC OT "6 Clicks" Daily Activity     Outcome Measure   Help from another person eating meals?: A Little Help from another person taking care of personal grooming?: A Little Help from another person toileting, which includes using toliet, bedpan, or urinal?: A Lot Help from another person bathing (including washing, rinsing, drying)?: A Lot Help from another person to put on and taking off regular upper body clothing?: A Lot Help from another person to put on and taking off regular lower body clothing?: A Lot 6 Click Score: 14    End of Session    OT Visit Diagnosis: Unsteadiness on feet (R26.81);Muscle weakness (generalized) (M62.81);Pain Pain - Right/Left: Right Pain - part of body: Hand   Activity Tolerance Patient tolerated treatment well   Patient Left in chair;with call bell/phone within reach   Nurse Communication Mobility status        Time: 1212-1228 OT Time Calculation (min): 16 min  Charges: OT General Charges $OT Visit: 1 Visit OT Treatments $Self Care/Home Management : 8-22 mins  03/10/2021  RP, OTR/L  Acute Rehabilitation Services  Office:   [phone]   Metta Clines 03/10/2021, 12:36 PM

## 2021-03-11 DIAGNOSIS — S52501A Unspecified fracture of the lower end of right radius, initial encounter for closed fracture: Secondary | ICD-10-CM | POA: Diagnosis not present

## 2021-03-12 DIAGNOSIS — Z8679 Personal history of other diseases of the circulatory system: Secondary | ICD-10-CM | POA: Diagnosis not present

## 2021-03-12 DIAGNOSIS — Z20822 Contact with and (suspected) exposure to covid-19: Secondary | ICD-10-CM | POA: Diagnosis not present

## 2021-03-12 DIAGNOSIS — I872 Venous insufficiency (chronic) (peripheral): Secondary | ICD-10-CM | POA: Diagnosis not present

## 2021-03-12 DIAGNOSIS — E7800 Pure hypercholesterolemia, unspecified: Secondary | ICD-10-CM | POA: Diagnosis not present

## 2021-03-12 DIAGNOSIS — Z1331 Encounter for screening for depression: Secondary | ICD-10-CM | POA: Diagnosis not present

## 2021-03-12 DIAGNOSIS — S6291XA Unspecified fracture of right wrist and hand, initial encounter for closed fracture: Secondary | ICD-10-CM | POA: Diagnosis not present

## 2021-03-12 DIAGNOSIS — R29818 Other symptoms and signs involving the nervous system: Secondary | ICD-10-CM | POA: Diagnosis not present

## 2021-03-12 DIAGNOSIS — Z881 Allergy status to other antibiotic agents status: Secondary | ICD-10-CM | POA: Diagnosis not present

## 2021-03-12 DIAGNOSIS — N1831 Chronic kidney disease, stage 3a: Secondary | ICD-10-CM | POA: Diagnosis not present

## 2021-03-12 DIAGNOSIS — R29810 Facial weakness: Secondary | ICD-10-CM | POA: Diagnosis not present

## 2021-03-12 DIAGNOSIS — E039 Hypothyroidism, unspecified: Secondary | ICD-10-CM | POA: Diagnosis not present

## 2021-03-12 DIAGNOSIS — M6281 Muscle weakness (generalized): Secondary | ICD-10-CM | POA: Diagnosis not present

## 2021-03-12 DIAGNOSIS — I10 Essential (primary) hypertension: Secondary | ICD-10-CM | POA: Diagnosis not present

## 2021-03-12 DIAGNOSIS — I739 Peripheral vascular disease, unspecified: Secondary | ICD-10-CM | POA: Diagnosis not present

## 2021-03-12 DIAGNOSIS — I63412 Cerebral infarction due to embolism of left middle cerebral artery: Secondary | ICD-10-CM | POA: Diagnosis not present

## 2021-03-12 DIAGNOSIS — Z90710 Acquired absence of both cervix and uterus: Secondary | ICD-10-CM | POA: Diagnosis not present

## 2021-03-12 DIAGNOSIS — R262 Difficulty in walking, not elsewhere classified: Secondary | ICD-10-CM | POA: Diagnosis not present

## 2021-03-12 DIAGNOSIS — I4892 Unspecified atrial flutter: Secondary | ICD-10-CM | POA: Diagnosis not present

## 2021-03-12 DIAGNOSIS — R4781 Slurred speech: Secondary | ICD-10-CM | POA: Diagnosis not present

## 2021-03-12 DIAGNOSIS — S52501A Unspecified fracture of the lower end of right radius, initial encounter for closed fracture: Secondary | ICD-10-CM | POA: Diagnosis not present

## 2021-03-12 DIAGNOSIS — B965 Pseudomonas (aeruginosa) (mallei) (pseudomallei) as the cause of diseases classified elsewhere: Secondary | ICD-10-CM | POA: Diagnosis not present

## 2021-03-12 DIAGNOSIS — I639 Cerebral infarction, unspecified: Secondary | ICD-10-CM | POA: Diagnosis not present

## 2021-03-12 DIAGNOSIS — S52101A Unspecified fracture of upper end of right radius, initial encounter for closed fracture: Secondary | ICD-10-CM | POA: Diagnosis not present

## 2021-03-12 DIAGNOSIS — I83028 Varicose veins of left lower extremity with ulcer other part of lower leg: Secondary | ICD-10-CM | POA: Diagnosis not present

## 2021-03-12 DIAGNOSIS — I2510 Atherosclerotic heart disease of native coronary artery without angina pectoris: Secondary | ICD-10-CM | POA: Diagnosis not present

## 2021-03-12 DIAGNOSIS — D759 Disease of blood and blood-forming organs, unspecified: Secondary | ICD-10-CM | POA: Diagnosis not present

## 2021-03-12 DIAGNOSIS — S6291XD Unspecified fracture of right wrist and hand, subsequent encounter for fracture with routine healing: Secondary | ICD-10-CM | POA: Diagnosis not present

## 2021-03-12 DIAGNOSIS — I70245 Atherosclerosis of native arteries of left leg with ulceration of other part of foot: Secondary | ICD-10-CM | POA: Diagnosis not present

## 2021-03-12 DIAGNOSIS — Z8616 Personal history of COVID-19: Secondary | ICD-10-CM | POA: Diagnosis not present

## 2021-03-12 DIAGNOSIS — I5032 Chronic diastolic (congestive) heart failure: Secondary | ICD-10-CM | POA: Diagnosis not present

## 2021-03-12 DIAGNOSIS — R471 Dysarthria and anarthria: Secondary | ICD-10-CM | POA: Diagnosis not present

## 2021-03-12 DIAGNOSIS — S52101D Unspecified fracture of upper end of right radius, subsequent encounter for closed fracture with routine healing: Secondary | ICD-10-CM | POA: Diagnosis not present

## 2021-03-12 DIAGNOSIS — Z743 Need for continuous supervision: Secondary | ICD-10-CM | POA: Diagnosis not present

## 2021-03-12 DIAGNOSIS — I484 Atypical atrial flutter: Secondary | ICD-10-CM | POA: Diagnosis not present

## 2021-03-12 DIAGNOSIS — Z8673 Personal history of transient ischemic attack (TIA), and cerebral infarction without residual deficits: Secondary | ICD-10-CM | POA: Diagnosis not present

## 2021-03-12 DIAGNOSIS — Z905 Acquired absence of kidney: Secondary | ICD-10-CM | POA: Diagnosis not present

## 2021-03-12 DIAGNOSIS — F22 Delusional disorders: Secondary | ICD-10-CM | POA: Diagnosis not present

## 2021-03-12 DIAGNOSIS — I70235 Atherosclerosis of native arteries of right leg with ulceration of other part of foot: Secondary | ICD-10-CM | POA: Diagnosis not present

## 2021-03-12 DIAGNOSIS — S52551D Other extraarticular fracture of lower end of right radius, subsequent encounter for closed fracture with routine healing: Secondary | ICD-10-CM | POA: Diagnosis not present

## 2021-03-12 DIAGNOSIS — R9431 Abnormal electrocardiogram [ECG] [EKG]: Secondary | ICD-10-CM | POA: Diagnosis not present

## 2021-03-12 DIAGNOSIS — N183 Chronic kidney disease, stage 3 unspecified: Secondary | ICD-10-CM | POA: Diagnosis not present

## 2021-03-12 DIAGNOSIS — R000 Tachycardia, unspecified: Secondary | ICD-10-CM | POA: Diagnosis not present

## 2021-03-12 DIAGNOSIS — Z853 Personal history of malignant neoplasm of breast: Secondary | ICD-10-CM | POA: Diagnosis not present

## 2021-03-12 DIAGNOSIS — I509 Heart failure, unspecified: Secondary | ICD-10-CM | POA: Diagnosis not present

## 2021-03-12 DIAGNOSIS — I959 Hypotension, unspecified: Secondary | ICD-10-CM | POA: Diagnosis not present

## 2021-03-12 DIAGNOSIS — R29703 NIHSS score 3: Secondary | ICD-10-CM | POA: Diagnosis not present

## 2021-03-12 DIAGNOSIS — L89312 Pressure ulcer of right buttock, stage 2: Secondary | ICD-10-CM | POA: Diagnosis not present

## 2021-03-12 DIAGNOSIS — R197 Diarrhea, unspecified: Secondary | ICD-10-CM | POA: Diagnosis not present

## 2021-03-12 DIAGNOSIS — Z8249 Family history of ischemic heart disease and other diseases of the circulatory system: Secondary | ICD-10-CM | POA: Diagnosis not present

## 2021-03-12 DIAGNOSIS — R269 Unspecified abnormalities of gait and mobility: Secondary | ICD-10-CM | POA: Diagnosis not present

## 2021-03-12 DIAGNOSIS — K219 Gastro-esophageal reflux disease without esophagitis: Secondary | ICD-10-CM | POA: Diagnosis not present

## 2021-03-12 DIAGNOSIS — L97509 Non-pressure chronic ulcer of other part of unspecified foot with unspecified severity: Secondary | ICD-10-CM | POA: Diagnosis not present

## 2021-03-12 DIAGNOSIS — L0390 Cellulitis, unspecified: Secondary | ICD-10-CM | POA: Diagnosis not present

## 2021-03-12 DIAGNOSIS — U071 COVID-19: Secondary | ICD-10-CM | POA: Diagnosis not present

## 2021-03-12 DIAGNOSIS — I442 Atrioventricular block, complete: Secondary | ICD-10-CM | POA: Diagnosis not present

## 2021-03-12 DIAGNOSIS — S52551A Other extraarticular fracture of lower end of right radius, initial encounter for closed fracture: Secondary | ICD-10-CM | POA: Diagnosis not present

## 2021-03-12 DIAGNOSIS — Z950 Presence of cardiac pacemaker: Secondary | ICD-10-CM | POA: Diagnosis not present

## 2021-03-12 DIAGNOSIS — T148XXA Other injury of unspecified body region, initial encounter: Secondary | ICD-10-CM | POA: Diagnosis not present

## 2021-03-12 DIAGNOSIS — W19XXXA Unspecified fall, initial encounter: Secondary | ICD-10-CM | POA: Diagnosis present

## 2021-03-12 DIAGNOSIS — I6389 Other cerebral infarction: Secondary | ICD-10-CM | POA: Diagnosis not present

## 2021-03-12 DIAGNOSIS — I951 Orthostatic hypotension: Secondary | ICD-10-CM | POA: Diagnosis not present

## 2021-03-12 NOTE — Progress Notes (Signed)
Patient will discharge to [facility] this afternoon.

## 2021-03-12 NOTE — ED Notes (Signed)
Pt provided discharge instructions and prescription information. Pt was given the opportunity to ask questions and questions were answered. Discharge signature not obtained in the setting of the COVID-19 pandemic in order to reduce high touch surfaces.   Pt care transferred to [facility].

## 2021-03-12 NOTE — ED Notes (Signed)
[facility] called to transport patient, will arrive at 4:30

## 2021-03-12 NOTE — Progress Notes (Signed)
Message left with [facility]

## 2021-03-12 NOTE — Progress Notes (Signed)
Physical Therapy Treatment Patient Details Name: [name] MRN: [id number] DOB: [birth year] Today's Date: 03/12/2021   History of Present Illness Patient to ED by EMS on 11/10 after fall at home resulting in right wrist injury resulting in right distal radius fx. Non operative management in sling. PMH:CHF, CHB, pacemaker, Orthostatic hypotension, CVA, anxiety    PT Comments    Pt progressing towards goals and able to increase ambulation distance this session. Remains unsteady and required min to mod A throughout. Performed seated and standing HEP with assist. Current recommendations appropriate. Will continue to follow acutely.    Recommendations for follow up therapy are one component of a multi-disciplinary discharge planning process, led by the attending physician.  Recommendations may be updated based on patient status, additional functional criteria and insurance authorization.  Follow Up Recommendations  Skilled nursing-short term rehab (<3 hours/day)     Assistance Recommended at Discharge Frequent or constant Supervision/Assistance  Equipment Recommendations  Other (comment) (TBD)    Recommendations for Other Services       Precautions / Restrictions Precautions Precautions: Fall Required Braces or Orthoses: Sling;Splint/Cast Restrictions Weight Bearing Restrictions: Yes RUE Weight Bearing: Non weight bearing     Mobility  Bed Mobility               General bed mobility comments: Sitting EOB upon entry    Transfers Overall transfer level: Needs assistance Equipment used: 1 person hand held assist Transfers: Sit to/from Stand Sit to Stand: Mod assist           General transfer comment: Mod A for lift assist and steadying. Stood X5 this session.    Ambulation/Gait Ambulation/Gait assistance: Min assist;Mod assist Gait Distance (Feet): 20 Feet Assistive device: 1 person hand held assist Gait Pattern/deviations: Step-to pattern;Decreased step  length - left;Decreased stance time - left;Decreased weight shift to left;Trunk flexed;Wide base of support;Antalgic;Staggering right;Staggering left Gait velocity: Decreased     General Gait Details: Unsteady during gait requiring min to mod A for support. Pt with increased fatigue and SOB which limited mobility tolerance.   Stairs             Wheelchair Mobility    Modified Rankin (Stroke Patients Only)       Balance Overall balance assessment: Needs assistance Sitting-balance support: No upper extremity supported;Feet supported Sitting balance-Leahy Scale: Good     Standing balance support: Single extremity supported;During functional activity Standing balance-Leahy Scale: Poor Standing balance comment: relies on left UE support and external support                            Cognition Arousal/Alertness: Awake/alert Behavior During Therapy: WFL for tasks assessed/performed Overall Cognitive Status: No family/caregiver present to determine baseline cognitive functioning                                 General Comments: Pt very repetitive throughout session. STM deficits noted.        Exercises General Exercises - Lower Extremity Long Arc Quad: AROM;Both;10 reps;Seated Hip Flexion/Marching: AROM;Both;10 reps;Standing (with LUE support and min A for steadying)    General Comments        Pertinent Vitals/Pain Pain Assessment: Faces Faces Pain Scale: Hurts a little bit Pain Location: right UE and low back from lying all day Pain Descriptors / Indicators: Aching;Grimacing;Guarding Pain Intervention(s): Monitored during session;Limited activity within patient's tolerance;Repositioned  Home Living                          Prior Function            PT Goals (current goals can now be found in the care plan section) Acute Rehab PT Goals Patient Stated Goal: To go to SNF PT Goal Formulation: With patient Time For Goal  Achievement: 03/22/21 Potential to Achieve Goals: Good Progress towards PT goals: Progressing toward goals    Frequency    Min 3X/week      PT Plan Current plan remains appropriate    Co-evaluation              AM-PAC PT "6 Clicks" Mobility   Outcome Measure  Help needed turning from your back to your side while in a flat bed without using bedrails?: A Lot Help needed moving from lying on your back to sitting on the side of a flat bed without using bedrails?: A Lot Help needed moving to and from a bed to a chair (including a wheelchair)?: A Lot Help needed standing up from a chair using your arms (e.g., wheelchair or bedside chair)?: A Lot Help needed to walk in hospital room?: A Lot Help needed climbing 3-5 steps with a railing? : Total 6 Click Score: 11    End of Session Equipment Utilized During Treatment: Gait belt;Other (comment) (sling) Activity Tolerance: Patient limited by fatigue Patient left: in bed (sitting EOB in hallway in ED at nurses station) Nurse Communication: Mobility status PT Visit Diagnosis: Unsteadiness on feet (R26.81);Muscle weakness (generalized) (M62.81)     Time: 3976-7341 PT Time Calculation (min) (ACUTE ONLY): 20 min  Charges:  $Gait Training: 8-22 mins                     Lou Miner, DPT  Acute Rehabilitation Services  Pager: [phone] Office: [phone]    Rudean Hitt 03/12/2021, 10:45 AM

## 2021-03-13 ENCOUNTER — Other Ambulatory Visit: Payer: Self-pay | Admitting: Family Medicine

## 2021-03-13 DIAGNOSIS — R262 Difficulty in walking, not elsewhere classified: Secondary | ICD-10-CM | POA: Diagnosis not present

## 2021-03-13 DIAGNOSIS — N183 Chronic kidney disease, stage 3 unspecified: Secondary | ICD-10-CM | POA: Diagnosis not present

## 2021-03-13 DIAGNOSIS — I442 Atrioventricular block, complete: Secondary | ICD-10-CM | POA: Diagnosis not present

## 2021-03-13 DIAGNOSIS — F22 Delusional disorders: Secondary | ICD-10-CM | POA: Diagnosis not present

## 2021-03-13 DIAGNOSIS — D759 Disease of blood and blood-forming organs, unspecified: Secondary | ICD-10-CM | POA: Diagnosis not present

## 2021-03-13 DIAGNOSIS — I509 Heart failure, unspecified: Secondary | ICD-10-CM | POA: Diagnosis not present

## 2021-03-13 DIAGNOSIS — I872 Venous insufficiency (chronic) (peripheral): Secondary | ICD-10-CM | POA: Diagnosis not present

## 2021-03-13 DIAGNOSIS — I2510 Atherosclerotic heart disease of native coronary artery without angina pectoris: Secondary | ICD-10-CM | POA: Diagnosis not present

## 2021-03-13 DIAGNOSIS — S52101A Unspecified fracture of upper end of right radius, initial encounter for closed fracture: Secondary | ICD-10-CM | POA: Diagnosis not present

## 2021-03-13 DIAGNOSIS — I951 Orthostatic hypotension: Secondary | ICD-10-CM | POA: Diagnosis not present

## 2021-03-13 DIAGNOSIS — R197 Diarrhea, unspecified: Secondary | ICD-10-CM | POA: Diagnosis not present

## 2021-03-13 DIAGNOSIS — E039 Hypothyroidism, unspecified: Secondary | ICD-10-CM | POA: Diagnosis not present

## 2021-03-15 DIAGNOSIS — I872 Venous insufficiency (chronic) (peripheral): Secondary | ICD-10-CM | POA: Diagnosis not present

## 2021-03-15 DIAGNOSIS — N183 Chronic kidney disease, stage 3 unspecified: Secondary | ICD-10-CM | POA: Diagnosis not present

## 2021-03-15 DIAGNOSIS — S52101D Unspecified fracture of upper end of right radius, subsequent encounter for closed fracture with routine healing: Secondary | ICD-10-CM | POA: Diagnosis not present

## 2021-03-15 DIAGNOSIS — I509 Heart failure, unspecified: Secondary | ICD-10-CM | POA: Diagnosis not present

## 2021-03-20 DIAGNOSIS — I70245 Atherosclerosis of native arteries of left leg with ulceration of other part of foot: Secondary | ICD-10-CM | POA: Diagnosis not present

## 2021-03-20 DIAGNOSIS — I83028 Varicose veins of left lower extremity with ulcer other part of lower leg: Secondary | ICD-10-CM | POA: Diagnosis not present

## 2021-03-20 DIAGNOSIS — I70235 Atherosclerosis of native arteries of right leg with ulceration of other part of foot: Secondary | ICD-10-CM | POA: Diagnosis not present

## 2021-03-20 DIAGNOSIS — L89312 Pressure ulcer of right buttock, stage 2: Secondary | ICD-10-CM | POA: Diagnosis not present

## 2021-03-27 DIAGNOSIS — I70245 Atherosclerosis of native arteries of left leg with ulceration of other part of foot: Secondary | ICD-10-CM | POA: Diagnosis not present

## 2021-03-27 DIAGNOSIS — S52551A Other extraarticular fracture of lower end of right radius, initial encounter for closed fracture: Secondary | ICD-10-CM | POA: Diagnosis not present

## 2021-03-27 DIAGNOSIS — I83028 Varicose veins of left lower extremity with ulcer other part of lower leg: Secondary | ICD-10-CM | POA: Diagnosis not present

## 2021-03-27 DIAGNOSIS — L89312 Pressure ulcer of right buttock, stage 2: Secondary | ICD-10-CM | POA: Diagnosis not present

## 2021-03-27 DIAGNOSIS — I70235 Atherosclerosis of native arteries of right leg with ulceration of other part of foot: Secondary | ICD-10-CM | POA: Diagnosis not present

## 2021-03-28 ENCOUNTER — Other Ambulatory Visit: Payer: Self-pay | Admitting: Family Medicine

## 2021-03-28 DIAGNOSIS — E039 Hypothyroidism, unspecified: Secondary | ICD-10-CM

## 2021-03-29 ENCOUNTER — Other Ambulatory Visit: Payer: Self-pay | Admitting: *Deleted

## 2021-03-29 DIAGNOSIS — I872 Venous insufficiency (chronic) (peripheral): Secondary | ICD-10-CM | POA: Diagnosis not present

## 2021-03-29 DIAGNOSIS — S6291XD Unspecified fracture of right wrist and hand, subsequent encounter for fracture with routine healing: Secondary | ICD-10-CM | POA: Diagnosis not present

## 2021-03-29 DIAGNOSIS — Z1331 Encounter for screening for depression: Secondary | ICD-10-CM | POA: Diagnosis not present

## 2021-03-29 DIAGNOSIS — F22 Delusional disorders: Secondary | ICD-10-CM | POA: Diagnosis not present

## 2021-03-29 DIAGNOSIS — M6281 Muscle weakness (generalized): Secondary | ICD-10-CM | POA: Diagnosis not present

## 2021-03-29 DIAGNOSIS — U071 COVID-19: Secondary | ICD-10-CM | POA: Diagnosis not present

## 2021-03-29 DIAGNOSIS — I509 Heart failure, unspecified: Secondary | ICD-10-CM | POA: Diagnosis not present

## 2021-03-29 DIAGNOSIS — R269 Unspecified abnormalities of gait and mobility: Secondary | ICD-10-CM | POA: Diagnosis not present

## 2021-03-29 DIAGNOSIS — L0390 Cellulitis, unspecified: Secondary | ICD-10-CM | POA: Diagnosis not present

## 2021-03-29 DIAGNOSIS — S52101D Unspecified fracture of upper end of right radius, subsequent encounter for closed fracture with routine healing: Secondary | ICD-10-CM | POA: Diagnosis not present

## 2021-03-29 DIAGNOSIS — I739 Peripheral vascular disease, unspecified: Secondary | ICD-10-CM | POA: Diagnosis not present

## 2021-03-29 DIAGNOSIS — S52551D Other extraarticular fracture of lower end of right radius, subsequent encounter for closed fracture with routine healing: Secondary | ICD-10-CM | POA: Diagnosis not present

## 2021-03-29 DIAGNOSIS — S6291XA Unspecified fracture of right wrist and hand, initial encounter for closed fracture: Secondary | ICD-10-CM | POA: Diagnosis not present

## 2021-03-29 NOTE — Patient Outreach (Signed)
Member screened for potential [facility] Care Management needs. Per [employer] member resides in [facility].   Communication sent to facility SW to inquire about transition plans.    Marthenia Rolling, MSN, RN,BSN [place] Acute Care Coordinator [phone] [facility]) [phone]  (Toll free office)

## 2021-03-30 ENCOUNTER — Other Ambulatory Visit: Payer: Self-pay | Admitting: *Deleted

## 2021-03-30 NOTE — Patient Outreach (Signed)
[place] Coordinator follow up. Member screened for potential [facility] Care Management needs.   Update received from [place] indicating [name] will either remain at [facility] long term vs transition to ALF if wounds heal.  No identifiable [facility] Care Management needs at this time.    Nichole Rolling, MSN, [name],[name] [place] Acute Care Coordinator [phone] [facility]) [phone]  (Toll free office)

## 2021-04-03 DIAGNOSIS — I70235 Atherosclerosis of native arteries of right leg with ulceration of other part of foot: Secondary | ICD-10-CM | POA: Diagnosis not present

## 2021-04-03 DIAGNOSIS — I70245 Atherosclerosis of native arteries of left leg with ulceration of other part of foot: Secondary | ICD-10-CM | POA: Diagnosis not present

## 2021-04-03 DIAGNOSIS — I83028 Varicose veins of left lower extremity with ulcer other part of lower leg: Secondary | ICD-10-CM | POA: Diagnosis not present

## 2021-04-03 DIAGNOSIS — L89312 Pressure ulcer of right buttock, stage 2: Secondary | ICD-10-CM | POA: Diagnosis not present

## 2021-04-09 DIAGNOSIS — Z1331 Encounter for screening for depression: Secondary | ICD-10-CM | POA: Diagnosis not present

## 2021-04-09 DIAGNOSIS — S52101D Unspecified fracture of upper end of right radius, subsequent encounter for closed fracture with routine healing: Secondary | ICD-10-CM | POA: Diagnosis not present

## 2021-04-09 DIAGNOSIS — I509 Heart failure, unspecified: Secondary | ICD-10-CM | POA: Diagnosis not present

## 2021-04-09 DIAGNOSIS — U071 COVID-19: Secondary | ICD-10-CM | POA: Diagnosis not present

## 2021-04-09 DIAGNOSIS — I739 Peripheral vascular disease, unspecified: Secondary | ICD-10-CM | POA: Diagnosis not present

## 2021-04-09 DIAGNOSIS — F22 Delusional disorders: Secondary | ICD-10-CM | POA: Diagnosis not present

## 2021-04-10 DIAGNOSIS — I70245 Atherosclerosis of native arteries of left leg with ulceration of other part of foot: Secondary | ICD-10-CM | POA: Diagnosis not present

## 2021-04-10 DIAGNOSIS — I70235 Atherosclerosis of native arteries of right leg with ulceration of other part of foot: Secondary | ICD-10-CM | POA: Diagnosis not present

## 2021-04-10 DIAGNOSIS — I83028 Varicose veins of left lower extremity with ulcer other part of lower leg: Secondary | ICD-10-CM | POA: Diagnosis not present

## 2021-04-17 DIAGNOSIS — S52551D Other extraarticular fracture of lower end of right radius, subsequent encounter for closed fracture with routine healing: Secondary | ICD-10-CM | POA: Diagnosis not present

## 2021-04-17 DIAGNOSIS — L0390 Cellulitis, unspecified: Secondary | ICD-10-CM | POA: Diagnosis not present

## 2021-04-17 DIAGNOSIS — I872 Venous insufficiency (chronic) (peripheral): Secondary | ICD-10-CM | POA: Diagnosis not present

## 2021-04-18 ENCOUNTER — Ambulatory Visit: Payer: Medicare Other | Admitting: Podiatry

## 2021-04-26 DIAGNOSIS — I70235 Atherosclerosis of native arteries of right leg with ulceration of other part of foot: Secondary | ICD-10-CM | POA: Diagnosis not present

## 2021-04-26 DIAGNOSIS — I83028 Varicose veins of left lower extremity with ulcer other part of lower leg: Secondary | ICD-10-CM | POA: Diagnosis not present

## 2021-05-01 DIAGNOSIS — I70235 Atherosclerosis of native arteries of right leg with ulceration of other part of foot: Secondary | ICD-10-CM | POA: Diagnosis not present

## 2021-05-01 DIAGNOSIS — I83028 Varicose veins of left lower extremity with ulcer other part of lower leg: Secondary | ICD-10-CM | POA: Diagnosis not present

## 2021-05-03 ENCOUNTER — Inpatient Hospital Stay (HOSPITAL_COMMUNITY): Payer: Medicare Other

## 2021-05-03 ENCOUNTER — Other Ambulatory Visit: Payer: Self-pay

## 2021-05-03 ENCOUNTER — Emergency Department (HOSPITAL_COMMUNITY): Payer: Medicare Other

## 2021-05-03 ENCOUNTER — Inpatient Hospital Stay (HOSPITAL_COMMUNITY)
Admission: EM | Admit: 2021-05-03 | Discharge: 2021-05-06 | DRG: 062 | Disposition: A | Discharge status: Skilled Nursing Facility | Payer: Medicare Other | Source: Skilled Nursing Facility | Attending: Neurology | Admitting: Neurology

## 2021-05-03 ENCOUNTER — Encounter (HOSPITAL_COMMUNITY): Payer: Self-pay

## 2021-05-03 DIAGNOSIS — Z881 Allergy status to other antibiotic agents status: Secondary | ICD-10-CM | POA: Diagnosis not present

## 2021-05-03 DIAGNOSIS — T148XXA Other injury of unspecified body region, initial encounter: Secondary | ICD-10-CM | POA: Diagnosis not present

## 2021-05-03 DIAGNOSIS — Z743 Need for continuous supervision: Secondary | ICD-10-CM | POA: Diagnosis not present

## 2021-05-03 DIAGNOSIS — N1831 Chronic kidney disease, stage 3a: Secondary | ICD-10-CM | POA: Diagnosis present

## 2021-05-03 DIAGNOSIS — R000 Tachycardia, unspecified: Secondary | ICD-10-CM | POA: Diagnosis not present

## 2021-05-03 DIAGNOSIS — W19XXXA Unspecified fall, initial encounter: Secondary | ICD-10-CM | POA: Diagnosis present

## 2021-05-03 DIAGNOSIS — I951 Orthostatic hypotension: Secondary | ICD-10-CM | POA: Diagnosis present

## 2021-05-03 DIAGNOSIS — R531 Weakness: Secondary | ICD-10-CM | POA: Diagnosis not present

## 2021-05-03 DIAGNOSIS — Z8673 Personal history of transient ischemic attack (TIA), and cerebral infarction without residual deficits: Secondary | ICD-10-CM

## 2021-05-03 DIAGNOSIS — I639 Cerebral infarction, unspecified: Secondary | ICD-10-CM | POA: Diagnosis not present

## 2021-05-03 DIAGNOSIS — Z8679 Personal history of other diseases of the circulatory system: Secondary | ICD-10-CM | POA: Diagnosis not present

## 2021-05-03 DIAGNOSIS — S52501A Unspecified fracture of the lower end of right radius, initial encounter for closed fracture: Secondary | ICD-10-CM | POA: Diagnosis not present

## 2021-05-03 DIAGNOSIS — S6291XA Unspecified fracture of right wrist and hand, initial encounter for closed fracture: Secondary | ICD-10-CM | POA: Diagnosis not present

## 2021-05-03 DIAGNOSIS — I6389 Other cerebral infarction: Secondary | ICD-10-CM

## 2021-05-03 DIAGNOSIS — Z950 Presence of cardiac pacemaker: Secondary | ICD-10-CM

## 2021-05-03 DIAGNOSIS — R29810 Facial weakness: Secondary | ICD-10-CM | POA: Diagnosis present

## 2021-05-03 DIAGNOSIS — I63412 Cerebral infarction due to embolism of left middle cerebral artery: Secondary | ICD-10-CM | POA: Diagnosis not present

## 2021-05-03 DIAGNOSIS — Z79890 Hormone replacement therapy (postmenopausal): Secondary | ICD-10-CM

## 2021-05-03 DIAGNOSIS — I5032 Chronic diastolic (congestive) heart failure: Secondary | ICD-10-CM | POA: Diagnosis present

## 2021-05-03 DIAGNOSIS — E039 Hypothyroidism, unspecified: Secondary | ICD-10-CM | POA: Diagnosis present

## 2021-05-03 DIAGNOSIS — I4892 Unspecified atrial flutter: Secondary | ICD-10-CM | POA: Diagnosis present

## 2021-05-03 DIAGNOSIS — I2510 Atherosclerotic heart disease of native coronary artery without angina pectoris: Secondary | ICD-10-CM | POA: Diagnosis present

## 2021-05-03 DIAGNOSIS — I6523 Occlusion and stenosis of bilateral carotid arteries: Secondary | ICD-10-CM | POA: Diagnosis not present

## 2021-05-03 DIAGNOSIS — Z7401 Bed confinement status: Secondary | ICD-10-CM | POA: Diagnosis not present

## 2021-05-03 DIAGNOSIS — R471 Dysarthria and anarthria: Secondary | ICD-10-CM | POA: Diagnosis not present

## 2021-05-03 DIAGNOSIS — Z905 Acquired absence of kidney: Secondary | ICD-10-CM

## 2021-05-03 DIAGNOSIS — R29703 NIHSS score 3: Secondary | ICD-10-CM | POA: Diagnosis present

## 2021-05-03 DIAGNOSIS — B965 Pseudomonas (aeruginosa) (mallei) (pseudomallei) as the cause of diseases classified elsewhere: Secondary | ICD-10-CM | POA: Diagnosis present

## 2021-05-03 DIAGNOSIS — Z880 Allergy status to penicillin: Secondary | ICD-10-CM

## 2021-05-03 DIAGNOSIS — S6291XD Unspecified fracture of right wrist and hand, subsequent encounter for fracture with routine healing: Secondary | ICD-10-CM | POA: Diagnosis not present

## 2021-05-03 DIAGNOSIS — M6281 Muscle weakness (generalized): Secondary | ICD-10-CM | POA: Diagnosis not present

## 2021-05-03 DIAGNOSIS — K219 Gastro-esophageal reflux disease without esophagitis: Secondary | ICD-10-CM | POA: Diagnosis not present

## 2021-05-03 DIAGNOSIS — Z8249 Family history of ischemic heart disease and other diseases of the circulatory system: Secondary | ICD-10-CM | POA: Diagnosis not present

## 2021-05-03 DIAGNOSIS — U071 COVID-19: Secondary | ICD-10-CM | POA: Diagnosis not present

## 2021-05-03 DIAGNOSIS — M4312 Spondylolisthesis, cervical region: Secondary | ICD-10-CM | POA: Diagnosis not present

## 2021-05-03 DIAGNOSIS — Z90710 Acquired absence of both cervix and uterus: Secondary | ICD-10-CM | POA: Diagnosis not present

## 2021-05-03 DIAGNOSIS — R29818 Other symptoms and signs involving the nervous system: Secondary | ICD-10-CM | POA: Diagnosis not present

## 2021-05-03 DIAGNOSIS — Z79899 Other long term (current) drug therapy: Secondary | ICD-10-CM

## 2021-05-03 DIAGNOSIS — R9431 Abnormal electrocardiogram [ECG] [EKG]: Secondary | ICD-10-CM | POA: Diagnosis not present

## 2021-05-03 DIAGNOSIS — Z888 Allergy status to other drugs, medicaments and biological substances status: Secondary | ICD-10-CM

## 2021-05-03 DIAGNOSIS — E7800 Pure hypercholesterolemia, unspecified: Secondary | ICD-10-CM | POA: Diagnosis not present

## 2021-05-03 DIAGNOSIS — I442 Atrioventricular block, complete: Secondary | ICD-10-CM | POA: Diagnosis present

## 2021-05-03 DIAGNOSIS — Z853 Personal history of malignant neoplasm of breast: Secondary | ICD-10-CM

## 2021-05-03 DIAGNOSIS — I959 Hypotension, unspecified: Secondary | ICD-10-CM | POA: Diagnosis not present

## 2021-05-03 DIAGNOSIS — I771 Stricture of artery: Secondary | ICD-10-CM | POA: Diagnosis not present

## 2021-05-03 DIAGNOSIS — R4781 Slurred speech: Secondary | ICD-10-CM | POA: Diagnosis not present

## 2021-05-03 DIAGNOSIS — I7789 Other specified disorders of arteries and arterioles: Secondary | ICD-10-CM | POA: Diagnosis not present

## 2021-05-03 DIAGNOSIS — I484 Atypical atrial flutter: Secondary | ICD-10-CM | POA: Diagnosis not present

## 2021-05-03 DIAGNOSIS — Z8616 Personal history of COVID-19: Secondary | ICD-10-CM

## 2021-05-03 DIAGNOSIS — M47812 Spondylosis without myelopathy or radiculopathy, cervical region: Secondary | ICD-10-CM | POA: Diagnosis not present

## 2021-05-03 DIAGNOSIS — I872 Venous insufficiency (chronic) (peripheral): Secondary | ICD-10-CM | POA: Diagnosis not present

## 2021-05-03 DIAGNOSIS — R269 Unspecified abnormalities of gait and mobility: Secondary | ICD-10-CM | POA: Diagnosis not present

## 2021-05-03 DIAGNOSIS — Z885 Allergy status to narcotic agent status: Secondary | ICD-10-CM

## 2021-05-03 LAB — ECHOCARDIOGRAM COMPLETE
AR max vel: 1.65 cm2
AV Area VTI: 2.02 cm2
AV Area mean vel: 1.73 cm2
AV Mean grad: 7 mmHg
AV Peak grad: 14.1 mmHg
Ao pk vel: 1.88 m/s
Area-P 1/2: 3.68 cm2
S' Lateral: 3.5 cm
Weight: 2988.8 oz

## 2021-05-03 LAB — APTT: aPTT: 35 seconds (ref 24–36)

## 2021-05-03 LAB — COMPREHENSIVE METABOLIC PANEL
ALT: 13 U/L (ref 0–44)
AST: 21 U/L (ref 15–41)
Albumin: 3 g/dL — ABNORMAL LOW (ref 3.5–5.0)
Alkaline Phosphatase: 120 U/L (ref 38–126)
Anion gap: 9 (ref 5–15)
BUN: 17 mg/dL (ref 8–23)
CO2: 26 mmol/L (ref 22–32)
Calcium: 8.5 mg/dL — ABNORMAL LOW (ref 8.9–10.3)
Chloride: 107 mmol/L (ref 98–111)
Creatinine, Ser: 1.22 mg/dL — ABNORMAL HIGH (ref 0.44–1.00)
GFR, Estimated: 42 mL/min — ABNORMAL LOW (ref >60)
Glucose, Bld: 122 mg/dL — ABNORMAL HIGH (ref 70–99)
Potassium: 3.6 mmol/L (ref 3.5–5.1)
Sodium: 142 mmol/L (ref 135–145)
Total Bilirubin: 0.7 mg/dL (ref 0.3–1.2)
Total Protein: 6.6 g/dL (ref 6.5–8.1)

## 2021-05-03 LAB — CBC WITH DIFFERENTIAL/PLATELET
Abs Immature Granulocytes: 0.02 10*3/uL (ref 0.00–0.07)
Basophils Absolute: 0 10*3/uL (ref 0.0–0.1)
Basophils Relative: 0 %
Eosinophils Absolute: 0.4 10*3/uL (ref 0.0–0.5)
Eosinophils Relative: 5 %
HCT: 41.1 % (ref 36.0–46.0)
Hemoglobin: 13.1 g/dL (ref 12.0–15.0)
Immature Granulocytes: 0 %
Lymphocytes Relative: 13 %
Lymphs Abs: 1 10*3/uL (ref 0.7–4.0)
MCH: 32 pg (ref 26.0–34.0)
MCHC: 31.9 g/dL (ref 30.0–36.0)
MCV: 100.5 fL — ABNORMAL HIGH (ref 80.0–100.0)
Monocytes Absolute: 0.7 10*3/uL (ref 0.1–1.0)
Monocytes Relative: 10 %
Neutro Abs: 5.1 10*3/uL (ref 1.7–7.7)
Neutrophils Relative %: 72 %
Platelets: 272 10*3/uL (ref 150–400)
RBC: 4.09 MIL/uL (ref 3.87–5.11)
RDW: 14.9 % (ref 11.5–15.5)
WBC: 7.3 10*3/uL (ref 4.0–10.5)
nRBC: 0 % (ref 0.0–0.2)

## 2021-05-03 LAB — PROTIME-INR
INR: 1.1 (ref 0.8–1.2)
Prothrombin Time: 14 seconds (ref 11.4–15.2)

## 2021-05-03 LAB — I-STAT CHEM 8, ED
BUN: 21 mg/dL (ref 8–23)
Calcium, Ion: 0.98 mmol/L — ABNORMAL LOW (ref 1.15–1.40)
Chloride: 106 mmol/L (ref 98–111)
Creatinine, Ser: 1.1 mg/dL — ABNORMAL HIGH (ref 0.44–1.00)
Glucose, Bld: 119 mg/dL — ABNORMAL HIGH (ref 70–99)
HCT: 43 % (ref 36.0–46.0)
Hemoglobin: 14.6 g/dL (ref 12.0–15.0)
Potassium: 3.7 mmol/L (ref 3.5–5.1)
Sodium: 140 mmol/L (ref 135–145)
TCO2: 26 mmol/L (ref 22–32)

## 2021-05-03 LAB — MRSA NEXT GEN BY PCR, NASAL: MRSA by PCR Next Gen: NOT DETECTED

## 2021-05-03 LAB — RESP PANEL BY RT-PCR (FLU A&B, COVID) ARPGX2
Influenza A by PCR: NEGATIVE
Influenza B by PCR: NEGATIVE
SARS Coronavirus 2 by RT PCR: POSITIVE — AB

## 2021-05-03 LAB — ETHANOL: Alcohol, Ethyl (B): <10 mg/dL (ref <10)

## 2021-05-03 MED ORDER — TENECTEPLASE FOR STROKE
0.2500 mg/kg | PACK | Freq: Once | INTRAVENOUS | Status: AC
  Administered 2021-05-03: 21 mg via INTRAVENOUS
  Filled 2021-05-03: qty 10

## 2021-05-03 MED ORDER — SODIUM CHLORIDE 0.9 % IV SOLN: INTRAVENOUS | Status: DC

## 2021-05-03 MED ORDER — ACETAMINOPHEN 650 MG RE SUPP: 650.0000 mg | RECTAL | Status: DC | PRN

## 2021-05-03 MED ORDER — STROKE: EARLY STAGES OF RECOVERY BOOK
Freq: Once | Status: DC
  Filled 2021-05-03: qty 1

## 2021-05-03 MED ORDER — PANTOPRAZOLE SODIUM 40 MG IV SOLR
40.0000 mg | Freq: Every day | INTRAVENOUS | Status: DC
  Administered 2021-05-03: 40 mg via INTRAVENOUS
  Filled 2021-05-03: qty 40

## 2021-05-03 MED ORDER — ACETAMINOPHEN 325 MG PO TABS: 650.0000 mg | ORAL_TABLET | ORAL | Status: DC | PRN

## 2021-05-03 MED ORDER — ACETAMINOPHEN 160 MG/5ML PO SOLN: 650.0000 mg | ORAL | Status: DC | PRN

## 2021-05-03 MED ORDER — SENNOSIDES-DOCUSATE SODIUM 8.6-50 MG PO TABS: 1.0000 | ORAL_TABLET | Freq: Every evening | ORAL | Status: DC | PRN

## 2021-05-03 MED ORDER — CHLORHEXIDINE GLUCONATE CLOTH 2 % EX PADS
6.0000 | MEDICATED_PAD | Freq: Every day | CUTANEOUS | Status: DC
  Administered 2021-05-04: 6 via TOPICAL

## 2021-05-03 NOTE — Code Documentation (Addendum)
[name] where MD Leonie Man, Pharmacy, and ED RN were at the bedside with decision to give TNK.   Patient placed on the monitor, BP taken, and NIHSS completed. See timeline.  [name]  Stroke Response Coordination

## 2021-05-03 NOTE — ED Notes (Signed)
Pt had COVID 04/01/21

## 2021-05-03 NOTE — Progress Notes (Signed)
Per infection prevention, since patient is >3 weeks from initially testing positive for COVID and is asymptomatic, she does not need to be on airborne and contact precautions.   Montez Hageman, RN

## 2021-05-03 NOTE — Progress Notes (Signed)
Pt has a Sensia [id number] pacemaker, deemed unsafe for MRI per [employer]. Ordering NP aware.

## 2021-05-03 NOTE — Progress Notes (Deleted)
Pt has a Sensia [id number] pacemaker, deemed unsafe for MRI per [employer]. Ordering NP aware.

## 2021-05-03 NOTE — ED Triage Notes (Addendum)
Pt BIBA from [facility], where she is at for rehab for fall. Pt was last seen well at 0840 at breakfast. [name], [place], came to see pt at 0930 and pt had aphasia. Code Stroke activated in the field.

## 2021-05-03 NOTE — H&P (Signed)
Admission H&P    Chief Complaint: Speech difficulty and code stroke HPI: [name] is an 88 y.o. female with past medical history of congestive heart failure, complete heart block s/p permanent pacemaker implantation, hyperlipidemia, invasive ductal carcinoma of the breast, thyroid disease and venous insufficiency.  Patient had a fall on 03/08/2021 and sustained fracture of the right distal radius and was recuperating in rehab at an place.  She was last seen normal at 830 and she was having breakfast.  When her power of attorney arrived at 930 he noticed that she was struggling to speak and finding words and complete sentences.  EMS were called who called a code stroke improved.  Patient states she has had previous history of strokes but has made full recovery without lasting deficits.  She was independent in actives of daily living prior to her fall and recent admission to rehab.  She is not on any blood thinners and has not had any recent surgeries.  She denies any recent bleeding.  At presently in rehab she is not able to ambulate and uses a wheelchair mostly for ambulation. She has prior history of right MCA infarct on 09/21/2019 treated with IV tPA and successful mechanical thrombectomy and NIH stroke scale improved from 10 on admission to 0 at discharge She also has history of incidental cerebellar vermis hemorrhage in June 2020 source unclear but not felt to be hypertensive possibly a cavernoma but MRI could not be obtained due to patient having pacemaker tracing compatible LSN: 9:30 AM 05/03/2021 tPA Given: Yes after careful discussion of risk-benefit with patient and her health power of attorney NIH stroke scale 3  Past Medical History:  Diagnosis Date   Cataract    Chronic congestive heart failure, unspecified heart failure type ([place]) 09/19/2019   Complete heart block (HCC)    s/p PPM implant (MDT) by Dr Blanch Media.  Atrial lead could not be paced at time of the procedure.  She has chronic  AV dysociation   Delusional disorder ([place])    Exogenous obesity    Hypercholesterolemia    Invasive ductal carcinoma of breast ([place]) 03/2007   BILATERAL BREASTS   Orthostatic hypotension    treated with midodrine by Dr Rollene [name]   PONV (postoperative nausea and vomiting)    Thyroid disease    Venous insufficiency     Past Surgical History:  Procedure Laterality Date   ABDOMINAL HYSTERECTOMY     BREAST LUMPECTOMY Bilateral 2009   CATARACT EXTRACTION     EYE SURGERY X 2   CHOLECYSTECTOMY     COLONOSCOPY N/A 02/16/2015   Procedure: COLONOSCOPY;  Surgeon: Wilford Corner, MD;  Location[facility] ENDOSCOPY;  Service: Endoscopy;  Laterality: N/A;   EP IMPLANTABLE DEVICE N/A 02/23/2015   pacemaker generator change (MDT Sensia SR) by Dr Rayann Heman    ESOPHAGOGASTRODUODENOSCOPY N/A 02/16/2015   Procedure: ESOPHAGOGASTRODUODENOSCOPY (EGD);  Surgeon: Wilford Corner, MD;  Location: [facility] ENDOSCOPY;  Service: Endoscopy;  Laterality: N/A;   IR CT HEAD LTD  09/21/2019   IR PERCUTANEOUS ART THROMBECTOMY/INFUSION INTRACRANIAL INC DIAG ANGIO  09/21/2019   PACEMAKER INSERTION     MDT implanted by Dr Blanch Media.  Atrial lead placement was unsuccessful.  She has chronic AV dysociation   RADIOLOGY WITH ANESTHESIA N/A 09/21/2019   Procedure: IR WITH ANESTHESIA CODE STROKE;  Surgeon: Radiologist, Medication, MD;  Location: [place];  Service: Radiology;  Laterality: N/A;   remote right radical nephrectomy  2009    Family History  Problem Relation Age of  Onset   Heart disease Maternal Grandmother    Heart disease Mother    Social History:  reports that she has never smoked. She has never used smokeless tobacco. She reports that she does not drink alcohol and does not use drugs.  Allergies:  Allergies  Allergen Reactions   Amoxicillin-Pot Clavulanate Diarrhea and Nausea And Vomiting    Has patient had a PCN reaction causing immediate rash, facial/tongue/throat swelling, SOB or lightheadedness with hypotension:  Yes Has patient had a PCN reaction causing severe rash involving mucus membranes or skin necrosis: No Has patient had a PCN reaction that required hospitalization: No Has patient had a PCN reaction occurring within the last 10 years: Yes If all of the above answers are "NO", then may proceed with Cephalosporin use.    Erythromycin Hives   Tape Other (See Comments)    BAND AIDS-SKIN IRRITATION   Codeine Nausea And Vomiting   Doxycycline Diarrhea   Flagyl [Metronidazole] Hives   Macrobid [Nitrofurantoin Macrocrystal] Nausea And Vomiting   Tolterodine Nausea And Vomiting   Ciprofloxacin Hives and Rash    (Not in a hospital admission)   ROS: 14 system review of system was performed and was positive only for fall, hand pain, speech difficulty, word finding difficulty all other systems negative  Physical Examination: Blood pressure 113/61, pulse (!) 59, temperature 97.7 F (36.5 C), temperature source Oral, resp. rate 17, weight 84.7 kg, SpO2 94 %. Frail elderly Caucasian lady not in distress.  Slightly hard of hearing.  She has 1+ pedal edema and erythematous and thickened skin in the lower extremities bilaterally. . Afebrile. Head is nontraumatic. Neck is supple without bruit.    Cardiac exam no murmur or gallop. Lungs are clear to auscultation. Distal pulses are well felt.    Neurologic Examination: Awake alert oriented to time place and person.  Mild dysarthria but can be understood with some difficulties.  Mild nonfluent speech with occasional word finding difficulties and some word hesitancy.  No paraphasic errors.  Good naming, repetition and comprehension.  Follows commands well.  Extraocular movements are full range without nystagmus.  No visual field defect.  Mild right lower facial asymmetry when she smiles.  Tongue midline.  Motor system exam shows no upper or lower extremity drift.  No focal extremity weakness.  Mild symmetric bilateral lower extremity proximal weakness.  Deep  tendon reflexes symmetric.  Plantars downgoing.  Sensation intact.  Coordination slow but accurate.  Gait not tested.  NIHSS 3 (one-point each for dysarthria, right facial droop and nonfluent speech) Baseline modified Rankin scale 4 Results for orders placed or performed during the hospital encounter of 05/03/21 (from the past 48 hour(s))  Ethanol     Status: None   Collection Time: 05/03/21 10:35 AM  Result Value Ref Range   Alcohol, Ethyl (B) <10 <10 mg/dL    Comment: (NOTE) Lowest detectable limit for serum alcohol is 10 mg/dL.  For medical purposes only. Performed at [facility], [place] [address] [place], [place] [address]   Protime-INR     Status: None   Collection Time: 05/03/21 10:35 AM  Result Value Ref Range   Prothrombin Time 14.0 11.4 - 15.2 seconds   INR 1.1 0.8 - 1.2    Comment: (NOTE) INR goal varies based on device and disease states. Performed at [facility], [place] [address] [place], [place] [address]   APTT     Status: None   Collection Time: 05/03/21 10:35 AM  Result Value Ref Range   aPTT 35 24 - 36 seconds    Comment: Performed at [place] [address], [place], [place] [address]  Comprehensive metabolic panel     Status: Abnormal   Collection Time: 05/03/21 10:35 AM  Result Value Ref Range   Sodium 142 135 - 145 mmol/L   Potassium 3.6 3.5 - 5.1 mmol/L   Chloride 107 98 - 111 mmol/L   CO2 26 22 - 32 mmol/L   Glucose, Bld 122 (H) 70 - 99 mg/dL    Comment: Glucose reference range applies only to samples taken after fasting for at least 8 hours.   BUN 17 8 - 23 mg/dL   Creatinine, Ser 1.22 (H) 0.44 - 1.00 mg/dL   Calcium 8.5 (L) 8.9 - 10.3 mg/dL   Total Protein 6.6 6.5 - 8.1 g/dL   Albumin 3.0 (L) 3.5 - 5.0 g/dL   AST 21 15 - 41 U/L   ALT 13 0 - 44 U/L   Alkaline Phosphatase 120 38 - 126 U/L   Total Bilirubin 0.7 0.3 - 1.2 mg/dL   GFR, Estimated 42 (L) >60 mL/min    Comment: (NOTE) Calculated using the CKD-EPI Creatinine  Equation (2021)    Anion gap 9 5 - 15    Comment: Performed at [facility], [place] [address] [place], [place] [address]  I-stat chem 8, ED     Status: Abnormal   Collection Time: 05/03/21 10:41 AM  Result Value Ref Range   Sodium 140 135 - 145 mmol/L   Potassium 3.7 3.5 - 5.1 mmol/L   Chloride 106 98 - 111 mmol/L   BUN 21 8 - 23 mg/dL   Creatinine, Ser 1.10 (H) 0.44 - 1.00 mg/dL   Glucose, Bld 119 (H) 70 - 99 mg/dL    Comment: Glucose reference range applies only to samples taken after fasting for at least 8 hours.   Calcium, Ion 0.98 (L) 1.15 - 1.40 mmol/L   TCO2 26 22 - 32 mmol/L   Hemoglobin 14.6 12.0 - 15.0 g/dL   HCT 43.0 36.0 - 46.0 %  CBC with Differential/Platelet     Status: Abnormal   Collection Time: 05/03/21 11:07 AM  Result Value Ref Range   WBC 7.3 4.0 - 10.5 K/uL   RBC 4.09 3.87 - 5.11 MIL/uL   Hemoglobin 13.1 12.0 - 15.0 g/dL   HCT 41.1 36.0 - 46.0 %   MCV 100.5 (H) 80.0 - 100.0 fL   MCH 32.0 26.0 - 34.0 pg   MCHC 31.9 30.0 - 36.0 g/dL   RDW 14.9 11.5 - 15.5 %   Platelets 272 150 - 400 K/uL   nRBC 0.0 0.0 - 0.2 %   Neutrophils Relative % 72 %   Neutro Abs 5.1 1.7 - 7.7 K/uL   Lymphocytes Relative 13 %   Lymphs Abs 1.0 0.7 - 4.0 K/uL   Monocytes Relative 10 %   Monocytes Absolute 0.7 0.1 - 1.0 K/uL   Eosinophils Relative 5 %   Eosinophils Absolute 0.4 0.0 - 0.5 K/uL   Basophils Relative 0 %   Basophils Absolute 0.0 0.0 - 0.1 K/uL   Immature Granulocytes 0 %   Abs Immature Granulocytes 0.02 0.00 - 0.07 K/uL    Comment: Performed at [facility], [address], [place], [place] [address]   CT HEAD CODE STROKE WO CONTRAST  Result Date: 05/03/2021 CLINICAL DATA:  Code stroke.  Acute neuro deficit.  Aphasia. EXAM: CT HEAD WITHOUT CONTRAST TECHNIQUE: Contiguous axial images were obtained from the base of the skull through the vertex without intravenous contrast. COMPARISON:  CT head 09/22/2019 FINDINGS: Brain: Generalized atrophy. Mild white matter  changes with patchy hypodensity in the cerebral white matter bilaterally, unchanged from the prior study. Negative for acute infarct, hemorrhage, mass. Vascular: Negative for hyperdense vessel Skull: Negative Sinuses/Orbits: Mild mucosal edema paranasal sinuses. Prior sinus surgery. Negative orbit Other: None ASPECTS ([place] Stroke Program Early CT Score) - Ganglionic level infarction (caudate, lentiform nuclei, internal capsule, insula, M1-M3 cortex): 7 - Supraganglionic infarction (M4-M6 cortex): 3 Total score (0-10 with 10 being normal): 10 IMPRESSION: 1. No acute abnormality 2. ASPECTS is 10 3. Atrophy and chronic microvascular ischemic change in the white matter. 4. Code stroke imaging results were communicated on 05/03/2021 at 10:59 am to provider Leonie Man via text page Electronically Signed   By: Franchot Gallo M.D.   On: 05/03/2021 10:59    Assessment: 88 y.o. female with sudden onset of speech and word finding difficulty and dysarthria and right facial droop right knee from small left MCA branch infarct likely of embolic etiology.  Remote history of right MCA infarct of cryptogenic etiology in May 2021 treated with IV tPA and successful mechanical thrombectomy with excellent clinical outcome  Stroke Risk Factors - hyperlipidemia and history of stroke and coronary artery disease  Plan: Admit to intensive care unit with close neurological monitoring and strict blood pressure control as per post thrombolytic protocol.  Keep systolic below 938/101 and use as needed IV labetalol or hydralazine. Check swallow eval and if she passes we will start her on home medications and oral diet. Check CT angiogram of the brain and neck close to 24 hours post thrombolysis cannot have an MRI for pacemaker which is not compatible. Check lipid profile, hemoglobin A1c echocardiogram and cardiac monitoring for A. Fib Physical, occupational and speech therapy consults. SCDs for DVT prophylaxis for now. Long discussion with  patient and her healthcare power of attorney at the bedside and answered questions. This patient is critically ill and at significant risk of neurological worsening, death and care requires constant monitoring of vital signs, hemodynamics,respiratory and cardiac monitoring, extensive review of multiple databases, frequent neurological assessment, discussion with family, other specialists and medical decision making of high complexity.I have made any additions or clarifications directly to the above note.This critical care time does not reflect procedure time, or teaching time or supervisory time of PA/NP/Med Resident etc but could involve care discussion time.  I spent 50 minutes of neurocritical care time  in the care of  this patient.   I have personally obtained history,examined this patient, reviewed notes, independently viewed imaging studies, participated in medical decision making and plan of care.ROS completed by me personally and pertinent positives fully documented  I have made any additions or clarifications directly to the above note. Agree with note above.    Antony Contras, MD Medical Director [facility] Pager: [phone] 05/03/2021 1:11 PM

## 2021-05-03 NOTE — Progress Notes (Signed)
°  Echocardiogram 2D Echocardiogram has been performed.  [name] F 05/03/2021, 1:52 PM

## 2021-05-03 NOTE — TOC CAGE-AID Note (Signed)
Transition of Care [facility]) - CAGE-AID Screening   Patient Details  Name: [name] MRN: [id number] Date of Birth: Oct 16, 1927  Transition of Care [facility]) CM/SW Contact:    [name], [place] Phone Number: 05/03/2021, 2:46 PM   Clinical Narrative: Pt is unable to participate in Cage Aid.    [name], MSW, LCSW-A Pronouns:  She/Her/Hers [name] Transitions of Care Clinical Social Worker Direct Number:  [phone] [name]   CAGE-AID Screening: Substance Abuse Screening unable to be completed due to: : Patient unable to participate             Substance Abuse Education Offered: No

## 2021-05-03 NOTE — ED Provider Notes (Signed)
[facility] EMERGENCY DEPARTMENT Provider Note   CSN: [id number] Arrival date & time: 05/03/21  1028  An emergency department physician performed an initial assessment on this suspected stroke patient at 1035.  History  Chief Complaint  Patient presents with   Code Stroke    [name] is a 88 y.o. female.  Pt presents to the ED today as a code stroke.  Pt was LSN at 0840.  Pt's close friend and POA, one of the ACs at [facility] came to visit at [facility] and noticed that she was unable to talk.  This was around 0930.  She normally is very alert and is able to communicate well.  She was living on her own until about November.  Facility called EMS and a code stroke was called.  Pt has had a prior stroke, but has fully recovered.  She is not on blood thinners.      Home Medications Prior to Admission medications   Medication Sig Start Date End Date Taking? Authorizing Provider  acetaminophen (TYLENOL) 500 MG tablet Take 500 mg by mouth in the morning and at bedtime.   Yes [provider]  Amino Acids-Protein Hydrolys (PRO-STAT PO) Take 30 mLs by mouth in the morning and at bedtime.   Yes [provider]  Ascorbic Acid (VITAMIN C PO) Take 1 tablet by mouth daily.   Yes [provider]  busPIRone (BUSPAR) 5 MG tablet Take 2.5 mg by mouth 3 (three) times daily.   Yes [provider]  furosemide (LASIX) 20 MG tablet TAKE 1/2 TABLET DAILY Patient taking differently: Take 10 mg by mouth daily. 09/18/20  Yes Martinique, [name], MD  gentamicin cream (GARAMYCIN) 0.1 % Apply 1 application topically daily.   Yes [provider]  latanoprost (XALATAN) 0.005 % ophthalmic solution Place 1 drop into both eyes at bedtime. 01/10/20  Yes [place], [name], MD  midodrine (PROAMATINE) 2.5 MG tablet TAKE 1 TABLET DAILY AFTER  BREAKFAST Patient taking differently: Take 2.5 mg by mouth daily. 03/13/21  Yes [place], [name], MD  SYNTHROID 50 MCG tablet  TAKE 1 TABLET DAILY BEFORE BREAKFAST Patient taking differently: Take 50 mcg by mouth daily before breakfast. 03/28/21  Yes [place], [name], MD  traMADol (ULTRAM) 50 MG tablet Take 1 tablet (50 mg total) by mouth every 8 (eight) hours as needed for up to 10 doses for severe pain. Patient not taking: Reported on 05/03/2021 03/08/21   Lennice Sites, DO      Allergies    Amoxicillin-pot clavulanate, Erythromycin, Tape, Codeine, Doxycycline, Flagyl [metronidazole], Macrobid [nitrofurantoin macrocrystal], Tolterodine, and Ciprofloxacin    Review of Systems   Review of Systems  Neurological:  Positive for speech difficulty.  All other systems reviewed and are negative.  Physical Exam Updated Vital Signs BP (!) 111/59    Pulse (!) 58    Temp 97.7 F (36.5 C) (Oral)    Resp 20    Wt 84.7 kg    SpO2 93%    BMI 28.83 kg/m  Physical Exam Vitals and nursing note reviewed.  Constitutional:      Appearance: Normal appearance.  HENT:     Head: Normocephalic and atraumatic.     Right Ear: External ear normal.     Left Ear: External ear normal.     Nose: Nose normal.     Mouth/Throat:     Mouth: Mucous membranes are moist.     Pharynx: Oropharynx is clear.  Eyes:  Extraocular Movements: Extraocular movements intact.     Conjunctiva/sclera: Conjunctivae normal.     Pupils: Pupils are equal, round, and reactive to light.  Cardiovascular:     Rate and Rhythm: Normal rate and regular rhythm.     Pulses: Normal pulses.     Heart sounds: Normal heart sounds.  Pulmonary:     Effort: Pulmonary effort is normal.     Breath sounds: Normal breath sounds.  Abdominal:     General: Abdomen is flat. Bowel sounds are normal.     Palpations: Abdomen is soft.  Musculoskeletal:        General: Normal range of motion.     Cervical back: Normal range of motion and neck supple.  Skin:    General: Skin is warm.     Capillary Refill: Capillary refill takes less than 2 seconds.  Neurological:     Mental  Status: She is alert and oriented to person, place, and time.     Comments: Mild slurred speech; right facial droop  Psychiatric:        Mood and Affect: Mood normal.        Behavior: Behavior normal.    ED Results / Procedures / Treatments   Labs (all labs ordered are listed, but only abnormal results are displayed) Labs Reviewed  RESP PANEL BY RT-PCR (FLU A&B, COVID) ARPGX2 - Abnormal; Notable for the following components:      Result Value   SARS Coronavirus 2 by RT PCR POSITIVE (*)    All other components within normal limits  COMPREHENSIVE METABOLIC PANEL - Abnormal; Notable for the following components:   Glucose, Bld 122 (*)    Creatinine, Ser 1.22 (*)    Calcium 8.5 (*)    Albumin 3.0 (*)    GFR, Estimated 42 (*)    All other components within normal limits  CBC WITH DIFFERENTIAL/PLATELET - Abnormal; Notable for the following components:   MCV 100.5 (*)    All other components within normal limits  I-STAT CHEM 8, ED - Abnormal; Notable for the following components:   Creatinine, Ser 1.10 (*)    Glucose, Bld 119 (*)    Calcium, Ion 0.98 (*)    All other components within normal limits  ETHANOL  PROTIME-INR  APTT  RAPID URINE DRUG SCREEN, HOSP PERFORMED  URINALYSIS, ROUTINE W REFLEX MICROSCOPIC    EKG EKG Interpretation  Date/Time:  Thursday May 03 2021 11:11:14 EST Ventricular Rate:  63 PR Interval:    QRS Duration: 184 QT Interval:  493 QTC Calculation: 505 R Axis:   -77 Text Interpretation: VENTRICULAR PACED RHYTHM No significant change since last tracing Confirmed by Isla Pence [phone]) on 05/03/2021 11:24:44 AM  Radiology CT HEAD CODE STROKE WO CONTRAST  Result Date: 05/03/2021 CLINICAL DATA:  Code stroke.  Acute neuro deficit.  Aphasia. EXAM: CT HEAD WITHOUT CONTRAST TECHNIQUE: Contiguous axial images were obtained from the base of the skull through the vertex without intravenous contrast. COMPARISON:  CT head 09/22/2019 FINDINGS: Brain:  Generalized atrophy. Mild white matter changes with patchy hypodensity in the cerebral white matter bilaterally, unchanged from the prior study. Negative for acute infarct, hemorrhage, mass. Vascular: Negative for hyperdense vessel Skull: Negative Sinuses/Orbits: Mild mucosal edema paranasal sinuses. Prior sinus surgery. Negative orbit Other: None ASPECTS ([place] Stroke Program Early CT Score) - Ganglionic level infarction (caudate, lentiform nuclei, internal capsule, insula, M1-M3 cortex): 7 - Supraganglionic infarction (M4-M6 cortex): 3 Total score (0-10 with 10 being normal): 10 IMPRESSION: 1. No  acute abnormality 2. ASPECTS is 10 3. Atrophy and chronic microvascular ischemic change in the white matter. 4. Code stroke imaging results were communicated on 05/03/2021 at 10:59 am to provider Leonie Man via text page Electronically Signed   By: Franchot Gallo M.D.   On: 05/03/2021 10:59    Procedures Procedures    Medications Ordered in ED Medications   stroke: mapping our early stages of recovery book (0 each Does not apply Hold 05/03/21 1157)  0.9 %  sodium chloride infusion ( Intravenous New Bag/Given 05/03/21 1200)  acetaminophen (TYLENOL) tablet 650 mg (has no administration in time range)    Or  acetaminophen (TYLENOL) 160 MG/5ML solution 650 mg (has no administration in time range)    Or  acetaminophen (TYLENOL) suppository 650 mg (has no administration in time range)  senna-docusate (Senokot-S) tablet 1 tablet (has no administration in time range)  pantoprazole (PROTONIX) injection 40 mg (has no administration in time range)  tenecteplase (TNKASE) injection for Stroke 21 mg (21 mg Intravenous Given 05/03/21 1103)    ED Course/ Medical Decision Making/ A&P                           Medical Decision Making Pt was met at the bridge by me and the code stroke team upon arrival.  Labs and scans reviewed by me.   Pt given Tnk at 1102 per Dr. Leonie Man.  CT and labs reviewed.  MRI and MRA head/neck  ordered by neurology, but pt has a pacemaker that is not MRI compatible.    [name] ([place]) at pt's bedside and is aware of plan.  Neurology to admit.  CRITICAL CARE Performed by: Isla Pence   Total critical care time: 30 minutes  Critical care time was exclusive of separately billable procedures and treating other patients.  Critical care was necessary to treat or prevent imminent or life-threatening deterioration.  Critical care was time spent personally by me on the following activities: development of treatment plan with patient and/or surrogate as well as nursing, discussions with consultants, evaluation of patient's response to treatment, examination of patient, obtaining history from patient or surrogate, ordering and performing treatments and interventions, ordering and review of laboratory studies, ordering and review of radiographic studies, pulse oximetry and re-evaluation of patient's condition.         Final Clinical Impression(s) / ED Diagnoses Final diagnoses:  Acute ischemic stroke [facility])    Rx / DC Orders ED Discharge Orders     None         Isla Pence, MD 05/03/21 1406

## 2021-05-03 NOTE — Progress Notes (Signed)
PHARMACIST CODE STROKE RESPONSE  Notified to mix TNK at 10:55 AM  by Dr. Leonie Man Delivered TNK to RN at 10:59 AM   TNK dose = 21mg  IV over 5 seconds  Issues/delays encountered (if applicable): called back due to decision change- initially thought to have recent surgery but then confirmed with son that was not operated on. Then rechecked BP. Given 11:02 AM hi  Brain Hilts 05/03/21 10:55 AM

## 2021-05-03 NOTE — ED Notes (Signed)
Delay on transport, echo at bedside.

## 2021-05-04 ENCOUNTER — Inpatient Hospital Stay (HOSPITAL_COMMUNITY): Payer: Medicare Other

## 2021-05-04 ENCOUNTER — Encounter (HOSPITAL_COMMUNITY): Payer: Medicare Other

## 2021-05-04 ENCOUNTER — Encounter (HOSPITAL_COMMUNITY): Payer: Self-pay

## 2021-05-04 DIAGNOSIS — I63412 Cerebral infarction due to embolism of left middle cerebral artery: Secondary | ICD-10-CM | POA: Diagnosis not present

## 2021-05-04 LAB — LIPID PANEL
Cholesterol: 126 mg/dL (ref 0–200)
HDL: 46 mg/dL (ref >40)
LDL Cholesterol: 71 mg/dL (ref 0–99)
Total CHOL/HDL Ratio: 2.7 RATIO
Triglycerides: 46 mg/dL (ref <150)
VLDL: 9 mg/dL (ref 0–40)

## 2021-05-04 LAB — HEMOGLOBIN A1C
Hgb A1c MFr Bld: 5.4 % (ref 4.8–5.6)
Mean Plasma Glucose: 108.28 mg/dL

## 2021-05-04 MED ORDER — MIDODRINE HCL 5 MG PO TABS
2.5000 mg | ORAL_TABLET | Freq: Every day | ORAL | Status: DC
  Administered 2021-05-05: 2.5 mg via ORAL
  Filled 2021-05-04: qty 1

## 2021-05-04 MED ORDER — ASPIRIN EC 81 MG PO TBEC
81.0000 mg | DELAYED_RELEASE_TABLET | Freq: Every day | ORAL | Status: DC
  Administered 2021-05-04 – 2021-05-05 (×2): 81 mg via ORAL
  Filled 2021-05-04 (×2): qty 1

## 2021-05-04 MED ORDER — BUSPIRONE HCL 5 MG PO TABS
2.5000 mg | ORAL_TABLET | Freq: Three times a day (TID) | ORAL | Status: DC
  Administered 2021-05-04 – 2021-05-05 (×4): 2.5 mg via ORAL
  Filled 2021-05-04 (×5): qty 0.5

## 2021-05-04 MED ORDER — PRAVASTATIN SODIUM 40 MG PO TABS
20.0000 mg | ORAL_TABLET | Freq: Every day | ORAL | Status: DC
  Administered 2021-05-05: 20 mg via ORAL
  Filled 2021-05-04: qty 1

## 2021-05-04 MED ORDER — PANTOPRAZOLE SODIUM 40 MG PO TBEC
40.0000 mg | DELAYED_RELEASE_TABLET | Freq: Every day | ORAL | Status: DC
  Administered 2021-05-04 – 2021-05-05 (×2): 40 mg via ORAL
  Filled 2021-05-04 (×2): qty 1

## 2021-05-04 MED ORDER — FUROSEMIDE 20 MG PO TABS
10.0000 mg | ORAL_TABLET | Freq: Every day | ORAL | Status: DC
  Administered 2021-05-05: 10 mg via ORAL
  Filled 2021-05-04: qty 1

## 2021-05-04 MED ORDER — IOHEXOL 350 MG/ML SOLN
50.0000 mL | Freq: Once | INTRAVENOUS | Status: AC | PRN
  Administered 2021-05-04: 50 mL via INTRAVENOUS

## 2021-05-04 MED ORDER — LEVOTHYROXINE SODIUM 50 MCG PO TABS
50.0000 ug | ORAL_TABLET | Freq: Every day | ORAL | Status: DC
  Administered 2021-05-05: 50 ug via ORAL
  Filled 2021-05-04: qty 1

## 2021-05-04 MED ORDER — LATANOPROST 0.005 % OP SOLN
1.0000 [drp] | Freq: Every day | OPHTHALMIC | Status: DC
  Administered 2021-05-04 – 2021-05-05 (×2): 1 [drp] via OPHTHALMIC
  Filled 2021-05-04: qty 2.5

## 2021-05-04 MED ORDER — CLOPIDOGREL BISULFATE 75 MG PO TABS
75.0000 mg | ORAL_TABLET | Freq: Every day | ORAL | Status: DC
  Administered 2021-05-05: 75 mg via ORAL
  Filled 2021-05-04: qty 1

## 2021-05-04 NOTE — NC FL2 (Addendum)
[place] LEVEL OF CARE SCREENING TOOL     IDENTIFICATION  Patient Name: [name] Birthdate: [birth year] Sex: female Admission Date (Current Location): 05/03/2021  [facility] and [place] Number:  Herbalist and Address:  [facility], [place] [address], [place], [place] [address]      Provider Number: [id number]  Attending Physician Name and Address:  Stroke, Md, MD  Relative Name and Phone Number:       Current Level of Care: Hospital Recommended Level of Care: [place] Prior Approval Number:    Date Approved/Denied:   PASRR Number: [id number]  Discharge Plan: SNF    Current Diagnoses: Patient Active Problem List   Diagnosis Date Noted   Stroke [facility]) 05/03/2021   COVID-19 virus detected 09/23/2019   Stroke aborted by administration of thrombolytic agent ([place]) - R MCA s/p tPA and mechanical thrombectomy, unk embolic source 99/24/2683   (HFpEF) heart failure with preserved ejection fraction ([place]) 09/19/2019   Morbid obesity ([place]) 09/19/2019   CKD (chronic kidney disease), stage III ([place]) 09/17/2019   Spontaneous subarachnoid hemorrhage ([place]) 10/04/2018   Unstable gait 01/19/2018   Venous stasis dermatitis of both lower extremities 01/09/2018   Diarrhea, chronic 01/09/2018   Lower extremity ulceration ([place]) 10/22/2017   Anxiety disorder 10/22/2017   PONV (postoperative nausea and vomiting)    Orthostatic hypotension    Hypercholesterolemia    Exogenous obesity    Rectal bleeding 03/30/2017   GI bleed 03/28/2017   BRBPR (bright red blood per rectum) 03/27/2017   Sigmoid diverticulitis 10/14/2016   Diverticulitis 10/14/2016   Diarrhea of presumed infectious origin 05/11/2016   Varicose veins of bilateral lower extremities with other complications 41/96/2229   Venous insufficiency 08/25/2015   Cellulitis 08/09/2015   Pressure ulcer 08/09/2015   CAD (coronary artery disease) 07/07/2015   Open wound  of right foot 07/07/2015   Delusional disorder ([place]) 07/05/2015   Bereavement reaction 07/05/2015   Pacemaker at end of battery life 02/23/2015   Acute GI bleeding 02/15/2015   Acute kidney injury (nontraumatic) ([place]) 02/15/2015   Hypothyroidism 02/15/2015   Left upper quadrant pain    Osteopenia 12/22/2013   Postural dizziness 04/05/2013   S/P placement of cardiac pacemaker 02/26/2013   Bilateral breast cancer ([place]) 03/25/2011   Invasive ductal carcinoma of breast ([place]) 03/30/2007    Orientation RESPIRATION BLADDER Height & Weight     Time, Self, Situation, Place  Normal Continent Weight: 186 lb 12.8 oz (84.7 kg) Height:     BEHAVIORAL SYMPTOMS/MOOD NEUROLOGICAL BOWEL NUTRITION STATUS      Continent Diet (see dc summary)  AMBULATORY STATUS COMMUNICATION OF NEEDS Skin   Limited Assist Verbally Normal                       Personal Care Assistance Level of Assistance  Bathing, Dressing, Feeding Bathing Assistance: Limited assistance Feeding assistance: Independent Dressing Assistance: Limited assistance     Functional Limitations Info             [place]  PT (By licensed PT)     PT Frequency: 5x/week              Contractures Contractures Info: Not present    Additional Factors Info  Code Status, Allergies Code Status Info: Full Allergies Info: Amoxicillin-pot Clavulanate, Erythromycin, Tape, Codeine, Doxycycline, Flagyl (Metronidazole), Macrobid (Nitrofurantoin Macrocrystal), Tolterodine, Ciprofloxacin  Current Medications (05/04/2021):  This is the current hospital active medication list Current Facility-Administered Medications  Medication Dose Route Frequency Provider Last Rate Last Admin    stroke: mapping our early stages of recovery book   Does not apply Once Janine Ores, NP       0.9 %  sodium chloride infusion   Intravenous Continuous Janine Ores, NP   Stopped at 05/04/21 5573   acetaminophen (TYLENOL)  tablet 650 mg  650 mg Oral Q4H PRN Janine Ores, NP       Or   acetaminophen (TYLENOL) 160 MG/5ML solution 650 mg  650 mg Per Tube Q4H PRN Janine Ores, NP       Or   acetaminophen (TYLENOL) suppository 650 mg  650 mg Rectal Q4H PRN Janine Ores, NP       aspirin EC tablet 81 mg  81 mg Oral Daily Garvin Fila, MD       Chlorhexidine Gluconate Cloth 2 % PADS 6 each  6 each Topical Daily Donnetta Simpers, MD   6 each at 05/04/21 1114   pantoprazole (PROTONIX) EC tablet 40 mg  40 mg Oral QHS Garvin Fila, MD       senna-docusate (Senokot-S) tablet 1 tablet  1 tablet Oral QHS PRN Janine Ores, NP         Discharge Medications: Please see discharge summary for a list of discharge medications.  Relevant Imaging Results:  Relevant Lab Results:   Additional Information SSN # [id number], 2 covid shots  Benard Halsted, LCSW  I have personally obtained history,examined this patient, reviewed notes, independently viewed imaging studies, participated in medical decision making and plan of care.ROS completed by me personally and pertinent positives fully documented  I have made any additions or clarifications directly to the above note. Agree with note above.    Antony Contras, MD Medical Director [place] Pager: [phone] 05/04/2021 6:09 PM

## 2021-05-04 NOTE — Progress Notes (Signed)
Pharmacy Antibiotic Note  [name] is a 88 y.o. female admitted on 05/03/2021 as code stroke.    Consulted by neurology to assess culture results from [facility] in regards for treatment. Nursing called to inform neurology and pharmacy of positive wound culture for pseudomonas.   Patient currently afebrile,  wbc within normal limits. Patient does not endorse any fever or chills recently. She states that she has been dealing with a weeping left leg for weeks and at one time was on antibiotics but stopped due to GI side effects.   Her left leg is wrapped so unable to see where the culture was taken or do proper assessment on wounds. Patient does not appear septic or in discomfort.   I discussed treatment options with neurology, if treatment is desired only oral option would be levofloxacin 250mg  daily based on renal function (rash noted to cipro although patient unable to confirm). Other treatment options would have to be IV antibiotics. With pending discharge back to [facility] place tomorrow will leave any antibiotic decisions to primary service at SNF without a formal antibiotic consult received here in the hospital.   Weight: 84.7 kg (186 lb 12.8 oz)  Temp (24hrs), Avg:98.2 F (36.8 C), Min:97.6 F (36.4 C), Max:98.7 F (37.1 C)  Recent Labs  Lab 05/03/21 1035 05/03/21 1041 05/03/21 1107  WBC  --   --  7.3  CREATININE 1.22* 1.10*  --     Estimated Creatinine Clearance: 37.7 mL/min (A) (by C-G formula based on SCr of 1.1 mg/dL (H)).    Allergies  Allergen Reactions   Amoxicillin-Pot Clavulanate Diarrhea and Nausea And Vomiting    Has patient had a PCN reaction causing immediate rash, facial/tongue/throat swelling, SOB or lightheadedness with hypotension: Yes Has patient had a PCN reaction causing severe rash involving mucus membranes or skin necrosis: No Has patient had a PCN reaction that required hospitalization: No Has patient had a PCN reaction occurring within the last 10  years: Yes If all of the above answers are "NO", then may proceed with Cephalosporin use.    Erythromycin Hives   Tape Other (See Comments)    BAND AIDS-SKIN IRRITATION   Codeine Nausea And Vomiting   Doxycycline Diarrhea   Flagyl [Metronidazole] Hives   Macrobid [Nitrofurantoin Macrocrystal] Nausea And Vomiting   Tolterodine Nausea And Vomiting   Ciprofloxacin Hives and Rash    Thank you for allowing pharmacy to be a part of this patients care.  Erin Hearing PharmD., BCPS Clinical Pharmacist 05/04/2021 5:17 PM

## 2021-05-04 NOTE — Progress Notes (Signed)
Patient has been at [facility] since November. TOC to continue to follow.   Gilmore Laroche, MSW, [facility]

## 2021-05-04 NOTE — Progress Notes (Addendum)
STROKE TEAM PROGRESS NOTE   INTERVAL HISTORY Her RN is at the bedside.  Patient reports her dysarthria and word finding difficulties have improved but she reports not quite being back to baseline yet. She denies any new symptoms at this time.   Discussed plan with patient to plan for discharge back to rehab later today or tomorrow once PT/OT evaluate patient and she gets her CT angio today. Discussed recommendation to resume all home medications besides blood thinners until after 24 hour have passed since TnK administration (1/5 11:00 AM). Patient verbalized understanding and had no other questions at this time.   We received a phone call from social worker late afternoon that [facility] which is growing Pseudomonas on culture which is pansensitive.  Patient is currently afebrile with normal white count hence I am not sure we need to treat her with antibiotics for this.  She has a long list of allergies to antibiotics including Cipro which complicates decision-making as to how to treat her Pseudomonas . Vitals:   05/04/21 0500 05/04/21 0600 05/04/21 0700 05/04/21 0800  BP: (!) 100/51 119/61 (!) 146/73 (!) 91/57  Pulse: 60 69 61 61  Resp: 20 (!) 21 (!) 21 18  Temp:      TempSrc:      SpO2: 94% 93% 92% 91%  Weight:       CBC:  Recent Labs  Lab 05/03/21 1041 05/03/21 1107  WBC  --  7.3  NEUTROABS  --  5.1  HGB 14.6 13.1  HCT 43.0 41.1  MCV  --  100.5*  PLT  --  494   Basic Metabolic Panel:  Recent Labs  Lab 05/03/21 1035 05/03/21 1041  NA 142 140  K 3.6 3.7  CL 107 106  CO2 26  --   GLUCOSE 122* 119*  BUN 17 21  CREATININE 1.22* 1.10*  CALCIUM 8.5*  --    Lipid Panel:  Recent Labs  Lab 05/04/21 0400  CHOL 126  TRIG 46  HDL 46  CHOLHDL 2.7  VLDL 9  LDLCALC 71   HgbA1c:  Recent Labs  Lab 05/04/21 0400  HGBA1C 5.4   Urine Drug Screen: No results for input(s): LABOPIA, COCAINSCRNUR, LABBENZ, AMPHETMU,  THCU, LABBARB in the last 168 hours.  Alcohol Level  Recent Labs  Lab 05/03/21 1035  ETH <10    IMAGING past 24 hours ECHOCARDIOGRAM COMPLETE  Result Date: 05/03/2021    ECHOCARDIOGRAM REPORT   Patient Name:   [name] Date of Exam: 05/03/2021 Medical Rec #:  [id number]        Height:       67.5 in Accession #:    [id number]       Weight:       186.8 lb Date of Birth:  [birth year]       BSA:          1.975 m Patient Age:    88 years         BP:           113/61 mmHg Patient Gender: F                HR:           60 bpm. Exam Location:  Inpatient Procedure: 2D Echo, Cardiac Doppler and Color Doppler Indications:    Stroke  History:        Patient has prior history of Echocardiogram examinations,  most                 recent 09/22/2019. CHF, CAD, Stroke; Risk Factors:Dyslipidemia.  Sonographer:    Merrie Roof RDCS Referring Phys: [id number] [facility] IMPRESSIONS  1. DIfficult acoustic windows Apical images are foreshortened. Cannot fullly evaluate regional wall motion. . Left ventricular ejection fraction, by estimation, is 55 to 60%. The left ventricle has normal function. The left ventricle has no regional wall motion abnormalities. The left ventricular internal cavity size was mildly dilated.  2. Right ventricular systolic function is low normal. The right ventricular size is mildly enlarged. There is moderately elevated pulmonary artery systolic pressure.  3. Left atrial size was severely dilated.  4. Right atrial size was severely dilated.  5. Mild mitral valve regurgitation.  6. Tricuspid valve regurgitation is mild to moderate.  7. The aortic valve is tricuspid. Aortic valve regurgitation is not visualized.  8. The inferior vena cava is dilated in size with <50% respiratory variability, suggesting right atrial pressure of 15 mmHg. Comparison(s): The left ventricular function is unchanged. FINDINGS  Left Ventricle: DIfficult acoustic windows Apical images are foreshortened. Cannot fullly evaluate  regional wall motion. Left ventricular ejection fraction, by estimation, is 55 to 60%. The left ventricle has normal function. The left ventricle has no regional wall motion abnormalities. The left ventricular internal cavity size was mildly dilated. There is no left ventricular hypertrophy. Right Ventricle: The right ventricular size is mildly enlarged. Right vetricular wall thickness was not assessed. Right ventricular systolic function is low normal. There is moderately elevated pulmonary artery systolic pressure. The tricuspid regurgitant velocity is 3.00 m/s, and with an assumed right atrial pressure of 15 mmHg, the estimated right ventricular systolic pressure is 59.9 mmHg. Left Atrium: Left atrial size was severely dilated. Right Atrium: Right atrial size was severely dilated. Pericardium: There is no evidence of pericardial effusion. Mitral Valve: Mild mitral annular calcification. Mild mitral valve regurgitation. Tricuspid Valve: The tricuspid valve is normal in structure. Tricuspid valve regurgitation is mild to moderate. Aortic Valve: The aortic valve is tricuspid. Aortic valve regurgitation is not visualized. Aortic valve mean gradient measures 7.0 mmHg. Aortic valve peak gradient measures 14.1 mmHg. Aortic valve area, by VTI measures 2.02 cm. Pulmonic Valve: The pulmonic valve was grossly normal. Pulmonic valve regurgitation is trivial. Aorta: The aortic root and ascending aorta are structurally normal, with no evidence of dilitation. Venous: The inferior vena cava is dilated in size with less than 50% respiratory variability, suggesting right atrial pressure of 15 mmHg. IAS/Shunts: No atrial level shunt detected by color flow Doppler. Additional Comments: A device lead is visualized.  LEFT VENTRICLE PLAX 2D LVIDd:         5.30 cm LVIDs:         3.50 cm LV PW:         1.10 cm LV IVS:        1.00 cm LVOT diam:     1.80 cm LV SV:         78 LV SV Index:   39 LVOT Area:     2.54 cm  RIGHT VENTRICLE             IVC RV Basal diam:  4.10 cm    IVC diam: 2.40 cm RV Mid diam:    3.30 cm RV S prime:     8.05 cm/s TAPSE (M-mode): 1.8 cm LEFT ATRIUM             Index  RIGHT ATRIUM           Index LA diam:        5.20 cm 2.63 cm/m   RA Area:     25.80 cm LA Vol (A2C):   58.2 ml 29.46 ml/m  RA Volume:   92.30 ml  46.73 ml/m LA Vol (A4C):   96.6 ml 48.90 ml/m LA Biplane Vol: 83.1 ml 42.07 ml/m  AORTIC VALVE AV Area (Vmax):    1.65 cm AV Area (Vmean):   1.73 cm AV Area (VTI):     2.02 cm AV Vmax:           188.00 cm/s AV Vmean:          123.000 cm/s AV VTI:            0.384 m AV Peak Grad:      14.1 mmHg AV Mean Grad:      7.0 mmHg LVOT Vmax:         122.00 cm/s LVOT Vmean:        83.400 cm/s LVOT VTI:          0.305 m LVOT/AV VTI ratio: 0.79  AORTA Ao Root diam: 3.00 cm Ao Asc diam:  3.10 cm MITRAL VALVE                TRICUSPID VALVE MV Area (PHT): 3.68 cm     TR Peak grad:   36.0 mmHg MV Decel Time: 206 msec     TR Vmax:        300.00 cm/s MV E velocity: 102.00 cm/s MV A velocity: 88.70 cm/s   SHUNTS MV E/A ratio:  1.15         Systemic VTI:  0.30 m                             Systemic Diam: 1.80 cm Dorris Carnes MD Electronically signed by Dorris Carnes MD Signature Date/Time: 05/03/2021/5:29:19 PM    Final    CT HEAD CODE STROKE WO CONTRAST  Result Date: 05/03/2021 CLINICAL DATA:  Code stroke.  Acute neuro deficit.  Aphasia. EXAM: CT HEAD WITHOUT CONTRAST TECHNIQUE: Contiguous axial images were obtained from the base of the skull through the vertex without intravenous contrast. COMPARISON:  CT head 09/22/2019 FINDINGS: Brain: Generalized atrophy. Mild white matter changes with patchy hypodensity in the cerebral white matter bilaterally, unchanged from the prior study. Negative for acute infarct, hemorrhage, mass. Vascular: Negative for hyperdense vessel Skull: Negative Sinuses/Orbits: Mild mucosal edema paranasal sinuses. Prior sinus surgery. Negative orbit Other: None ASPECTS ([place] Stroke Program Early CT  Score) - Ganglionic level infarction (caudate, lentiform nuclei, internal capsule, insula, M1-M3 cortex): 7 - Supraganglionic infarction (M4-M6 cortex): 3 Total score (0-10 with 10 being normal): 10 IMPRESSION: 1. No acute abnormality 2. ASPECTS is 10 3. Atrophy and chronic microvascular ischemic change in the white matter. 4. Code stroke imaging results were communicated on 05/03/2021 at 10:59 am to provider Leonie Man via text page Electronically Signed   By: Franchot Gallo M.D.   On: 05/03/2021 10:59    PHYSICAL EXAM  Temp:  [97.7 F (36.5 C)-98.7 F (37.1 C)] 98.7 F (37.1 C) (01/06 0400) Pulse Rate:  [58-71] 61 (01/06 0800) Resp:  [16-24] 18 (01/06 0800) BP: (81-146)/(39-78) 91/57 (01/06 0800) SpO2:  [89 %-99 %] 91 % (01/06 0800) Weight:  [84.7 kg] 84.7 kg (01/05 1055)  General - Well nourished, well developed, in no  apparent distress.  Cardiovascular - Regular rhythm and rate.  Mental Status -  Level of arousal and orientation to time, place, and person were intact. Language including expression and comprehension was assessed and found intact. Minor naming components of objects and repetition were noted. Attention span and concentration were normal. Recent and remote memory were intact. Fund of Knowledge was assessed and was intact.  Cranial Nerves II - XII - II - Visual field intact. III, IV, VI - Extraocular movements intact. V - Facial sensation intact bilaterally. VII - Facial movement intact bilaterally. VIII - Hearing & vestibular intact bilaterally. X - Palate elevates symmetrically. XI - Chin turning & shoulder shrug intact bilaterally. XII - Tongue protrusion intact.  Motor Strength - The patients strength was normal in all extremities and pronator drift was absent.  Bulk was normal and fasciculations were absent.   Motor Tone - Muscle tone was assessed at the neck and appendages and was normal.  Reflexes - The patients reflexes were symmetrical in all extremities and  she had no pathological reflexes.  Sensory - Light touch, temperature/pinprick were assessed and were symmetrical.    Coordination - The patient had normal movements in the hands and feet with no ataxia or dysmetria.  Tremor was absent.  Gait and Station - deferred.  ASSESSMENT/PLAN [name] is a 88 y.o. female with history of CHF, complete heart block s/p permanent pacemaker implanation, hyperlipidemia, invasive ductal carcinoma of the breast, thyroid disease, venous insufficiency, hx of stroke (08/2019 s/p TPA, 09/2018 concerning for cerebellar hemorrhage) presenting with dysarthria and word finding difficulties. She received TnK 1/5 11:00 AM.    Stroke: Suspect small left MCA branch infarct likely secondary due to embolism source Code Stroke CT head No acute abnormality. Atrophy and chronic microvascular ischemic disease in white matter. ASPECTS 10.  CTA head & neck pending MRI  not performed due to patient's pacemaker Carotid Doppler  scheduled 2D Echo EF 55-60%.  No wall motion abnormalities LDL 71 HgbA1c 5.4 VTE prophylaxis - SCDs    Diet   Diet Heart Room service appropriate? Yes; Fluid consistency: Thin   No antithrombotic prior to admission, now on No antithrombotic. As she just had TnK <24 hours prior Therapy recommendations:  resume home meds upon discharge Disposition:  return to [facility] rehab today or tomorrow  Other Stroke Risk Factors Advanced Age >/= 17  Hx stroke/TIA Coronary artery disease Congestive heart failure  Other Active Problems GERD: resume home med pantoprazole 40 mg  Hospital day # 1  France Ravens, MD PGY1 Resident STROKE MD NOTE :  I have personally obtained history,examined this patient, reviewed notes, independently viewed imaging studies, participated in medical decision making and plan of care.ROS completed by me personally and pertinent positives fully documented  I have made any additions or clarifications directly to the above note.  Agree with note above.  Patient presented with sudden onset of speech difficulties, dysarthria and right facial droop and was treated with IV TN K with near complete improvement.  CT scan is negative for acute stroke and MRI cannot be obtained because she has AICD.  She has prior history of right M2 occlusion treated with successful mechanical thrombectomy in May 2021 with excellent clinical recovery.  Strong suspicion for paroxysmal A. fib and may need prolonged cardiac monitoring at discharge.  Recommend strict blood pressure control and close neurological monitoring as per post tPA protocol.  Mobilize out of bed.  Physical occupational and speech therapy consults.  Check CT angiogram of the brain this afternoon and if no bleed start aspirin for stroke prevention.  Continue ongoing stroke work-up.  Plan to hold off on starting antibiotics for Pseudomonas growing from her leg wheezing as she does not appear to have active signs of infection and is afebrile with normal white count.  I discussed this over the phone with patient's and power of attorney [name] and he is breathing in agreement with the plan.  Patient will likely be transferred to [facility] rehabilitation tomorrow This patient is critically ill and at significant risk of neurological worsening, death and care requires constant monitoring of vital signs, hemodynamics,respiratory and cardiac monitoring, extensive review of multiple databases, frequent neurological assessment, discussion with family, other specialists and medical decision making of high complexity.I have made any additions or clarifications directly to the above note.This critical care time does not reflect procedure time, or teaching time or supervisory time of PA/NP/Med Resident etc but could involve care discussion time.  I spent 40 minutes of neurocritical care time  in the care of  this patient.      Antony Contras, MD Medical Director [facility] Pager:  [phone] 05/04/2021 5:11 PM  To contact Stroke Continuity provider, please refer to [email or web address] After hours, contact General Neurology

## 2021-05-04 NOTE — Evaluation (Signed)
Occupational Therapy Evaluation Patient Details Name: [name] MRN: [id number] DOB: August 07, 1927 Today's Date: 05/04/2021   History of Present Illness Patient to ED by EMS on 11/10 after fall at home resulting in right wrist injury resulting in right distal radius fx. Non operative management in sling. PMH:CHF, CHB, pacemaker, Orthostatic hypotension, CVA, anxiety   Clinical Impression   Patient admitted for the above diagnosis.  PTA she was at a local SNF for ST rehab following a recent hospitalization for mechanical fall at home.  Deficits impacting independence are listed below.  Currently she is needing up to [place] for lower body ADL, and up to [place] for basic mobility at a RW level.  OT recommends a return to SNF for continuation of rehab, and determination of possible transition to LTC.  OT to follow in the acute setting.         Recommendations for follow up therapy are one component of a multi-disciplinary discharge planning process, led by the attending physician.  Recommendations may be updated based on patient status, additional functional criteria and insurance authorization.   Follow Up Recommendations  Skilled nursing-short term rehab (<3 hours/day)    Assistance Recommended at Discharge Frequent or constant Supervision/Assistance  Patient can return home with the following      Functional Status Assessment  Patient has had a recent decline in their functional status and demonstrates the ability to make significant improvements in function in a reasonable and predictable amount of time.  Equipment Recommendations  None recommended by OT    Recommendations for Other Services       Precautions / Restrictions Precautions Precautions: Fall Required Braces or Orthoses: Splint/Cast Splint/Cast: R wrist immobilizer Restrictions Weight Bearing Restrictions: No      Mobility Bed Mobility Overal bed mobility: Needs Assistance Bed Mobility: Supine to Sit      Supine to sit: Min assist     General bed mobility comments: assist to bring legs off the bed Patient Response: Anxious;Restless  Transfers Overall transfer level: Needs assistance Equipment used: Rolling walker (2 wheels) Transfers: Sit to/from Stand;Bed to chair/wheelchair/BSC Sit to Stand: Min assist                  Balance Overall balance assessment: Needs assistance Sitting-balance support: Feet supported Sitting balance-Leahy Scale: Fair     Standing balance support: Reliant on assistive device for balance Standing balance-Leahy Scale: Poor                             ADL either performed or assessed with clinical judgement   ADL       Grooming: Wash/dry hands;Min guard;Sitting           Upper Body Dressing : Minimal assistance;Sitting   Lower Body Dressing: Maximal assistance;Sit to/from stand   Toilet Transfer: Minimal Teacher, English as a foreign language;Ambulation   Toileting- Clothing Manipulation and Hygiene: Min guard;Sit to/from stand               Vision Baseline Vision/History: 1 Wears glasses Patient Visual Report: No change from baseline       Perception Perception Perception: Not tested   Praxis Praxis Praxis: Not tested    Pertinent Vitals/Pain Pain Assessment: Faces Faces Pain Scale: No hurt Pain Intervention(s): Monitored during session     Hand Dominance Right   Extremity/Trunk Assessment Upper Extremity Assessment Upper Extremity Assessment: Generalized weakness   Lower Extremity Assessment Lower Extremity Assessment: Defer to PT  evaluation   Cervical / Trunk Assessment Cervical / Trunk Assessment: Kyphotic   Communication Communication Communication: No difficulties   Cognition Arousal/Alertness: Awake/alert Behavior During Therapy: Restless;Anxious Overall Cognitive Status: No family/caregiver present to determine baseline cognitive functioning                                 General  Comments: patient following commands and oriented to self, situation and place     General Comments   VSS on RA    Exercises     Shoulder Instructions      Home Living Family/patient expects to be discharged to:: Skilled nursing facility                                        Prior Functioning/Environment Prior Level of Function : Needs assist  Cognitive Assist : Mobility (cognitive);ADLs (cognitive) Mobility (Cognitive): Intermittent cues ADLs (Cognitive): Intermittent cues Physical Assist : Mobility (physical);ADLs (physical) Mobility (physical): Transfers;Gait ADLs (physical): Grooming;Bathing;Dressing;Toileting Mobility Comments: at SNF for ST Rehab at w/c level per chart ADLs Comments: Assume assist as needed        OT Problem List: Decreased strength;Decreased activity tolerance;Impaired balance (sitting and/or standing);Decreased safety awareness      OT Treatment/Interventions: Self-care/ADL training;Therapeutic exercise;Therapeutic activities;Balance training;Patient/family education    OT Goals(Current goals can be found in the care plan section) Acute Rehab OT Goals Patient Stated Goal: Go back to rehab OT Goal Formulation: With patient Time For Goal Achievement: 05/18/21 Potential to Achieve Goals: Good ADL Goals Pt Will Perform Grooming: with set-up;sitting Pt Will Perform Upper Body Bathing: with set-up;sitting Pt Will Perform Upper Body Dressing: with set-up;sitting Pt Will Transfer to Toilet: ambulating;regular height toilet;with min guard assist  OT Frequency: Min 2X/week    Co-evaluation PT/OT/SLP Co-Evaluation/Treatment: Yes Reason for Co-Treatment: Complexity of the patient's impairments (multi-system involvement);For patient/therapist safety;Necessary to address cognition/behavior during functional activity;To address functional/ADL transfers   OT goals addressed during session: ADL's and self-care      AM-PAC OT "6 Clicks"  Daily Activity     Outcome Measure Help from another person eating meals?: None Help from another person taking care of personal grooming?: A Little Help from another person toileting, which includes using toliet, bedpan, or urinal?: A Little Help from another person bathing (including washing, rinsing, drying)?: A Lot Help from another person to put on and taking off regular upper body clothing?: A Little Help from another person to put on and taking off regular lower body clothing?: A Lot 6 Click Score: 17   End of Session Equipment Utilized During Treatment: Gait belt;Rolling walker (2 wheels) Nurse Communication: Mobility status  Activity Tolerance: Patient tolerated treatment well Patient left: in chair;with call bell/phone within reach  OT Visit Diagnosis: Unsteadiness on feet (R26.81);Other symptoms and signs involving cognitive function                Time: 0093-8182 OT Time Calculation (min): 23 min Charges:  OT General Charges $OT Visit: 1 Visit OT Evaluation $OT Eval Moderate Complexity: 1 Mod  05/04/2021  RP, OTR/L  Acute Rehabilitation Services  Office:  [phone]   [name] 05/04/2021, 2:25 PM

## 2021-05-04 NOTE — Evaluation (Signed)
Physical Therapy Evaluation Patient Details Name: [name] MRN: [id number] DOB: [birth year] Today's Date: 05/04/2021  History of Present Illness  88 y/o female presented to ED on 05/03/21 for aphasia. TNK given. CT head negative. Unable to obtain MRI due to pacemaker. Recently admitted 11/10 following fall resulting in R distal radius fx which is non-operative, d/c'ed to SNF. PMH: CHF, complete heart block s/p pacemaker implant, invasive ductal carcinoma of breast, thyroid disease, stroke, venous insufficiency.  Clinical Impression  Patient admitted with above diagnosis. Patient presents with generalized weakness, impaired balance, decreased activity tolerance, and impaired cognition. Patient requires minA for bed mobility and sit to stand transfer. Patient ambulated to/from bathroom with RW and minA. Patient very anxious throughout session requiring cues for deep breathing. Patient will benefit from skilled PT services during acute stay to address listed deficits. Recommend return to SNF at discharge to continue therapy services.      Recommendations for follow up therapy are one component of a multi-disciplinary discharge planning process, led by the attending physician.  Recommendations may be updated based on patient status, additional functional criteria and insurance authorization.  Follow Up Recommendations Skilled nursing-short term rehab (<3 hours/day)    Assistance Recommended at Discharge Frequent or constant Supervision/Assistance  Patient can return home with the following       Equipment Recommendations Other (comment) (defer to next venue of care)  Recommendations for Other Services       Functional Status Assessment Patient has had a recent decline in their functional status and/or demonstrates limited ability to make significant improvements in function in a reasonable and predictable amount of time     Precautions / Restrictions Precautions Precautions:  Fall Required Braces or Orthoses: Splint/Cast Splint/Cast: R wrist immobilizer Restrictions Weight Bearing Restrictions: No      Mobility  Bed Mobility Overal bed mobility: Needs Assistance Bed Mobility: Supine to Sit     Supine to sit: Min assist     General bed mobility comments: assist to bring legs off the bed    Transfers Overall transfer level: Needs assistance Equipment used: Rolling Keighley Deckman (2 wheels) Transfers: Sit to/from Stand Sit to Stand: Min assist                Ambulation/Gait Ambulation/Gait assistance: Min assist Gait Distance (Feet): 10 Feet (+10') Assistive device: Rolling Annaclaire Walsworth (2 wheels) Gait Pattern/deviations: Step-through pattern;Decreased stride length;Drifts right/left;Trunk flexed Gait velocity: decreased     General Gait Details: minA for balance with RW. Cues for RW management. Patient anxious with mobility with increased RR. Cues for slowing breathing down  Stairs            Wheelchair Mobility    Modified Rankin (Stroke Patients Only) Modified Rankin (Stroke Patients Only) Pre-Morbid Rankin Score: Moderately severe disability Modified Rankin: Moderately severe disability     Balance Overall balance assessment: Needs assistance Sitting-balance support: Feet supported Sitting balance-Leahy Scale: Fair     Standing balance support: Reliant on assistive device for balance Standing balance-Leahy Scale: Poor                               Pertinent Vitals/Pain Pain Assessment: Faces Faces Pain Scale: No hurt Pain Intervention(s): Monitored during session    Home Living Family/patient expects to be discharged to:: Skilled nursing facility                        Prior  Function Prior Level of Function : Needs assist  Cognitive Assist : Mobility (cognitive);ADLs (cognitive) Mobility (Cognitive): Intermittent cues ADLs (Cognitive): Intermittent cues Physical Assist : Mobility (physical);ADLs  (physical) Mobility (physical): Transfers;Gait ADLs (physical): Grooming;Bathing;Dressing;Toileting Mobility Comments: at SNF for ST Rehab at w/c level per chart ADLs Comments: Assume assist as needed     Hand Dominance   Dominant Hand: Right    Extremity/Trunk Assessment   Upper Extremity Assessment Upper Extremity Assessment: Defer to OT evaluation    Lower Extremity Assessment Lower Extremity Assessment: Generalized weakness    Cervical / Trunk Assessment Cervical / Trunk Assessment: Kyphotic  Communication   Communication: No difficulties  Cognition Arousal/Alertness: Awake/alert Behavior During Therapy: Restless;Anxious Overall Cognitive Status: No family/caregiver present to determine baseline cognitive functioning                                 General Comments: patient following commands and oriented to self, situation and place        General Comments General comments (skin integrity, edema, etc.): VSS on RA    Exercises     Assessment/Plan    PT Assessment Patient needs continued PT services  PT Problem List Decreased strength;Decreased activity tolerance;Decreased mobility;Decreased balance;Decreased coordination;Decreased cognition;Decreased knowledge of use of DME;Decreased safety awareness;Decreased knowledge of precautions       PT Treatment Interventions DME instruction;Gait training;Functional mobility training;Therapeutic activities;Therapeutic exercise;Balance training;Neuromuscular re-education;Patient/family education    PT Goals (Current goals can be found in the Care Plan section)  Acute Rehab PT Goals Patient Stated Goal: to go to rehab PT Goal Formulation: With patient Time For Goal Achievement: 05/18/21 Potential to Achieve Goals: Fair    Frequency Min 3X/week     Co-evaluation   Reason for Co-Treatment: Complexity of the patient's impairments (multi-system involvement);For patient/therapist safety;Necessary to  address cognition/behavior during functional activity;To address functional/ADL transfers   OT goals addressed during session: ADL's and self-care       AM-PAC PT "6 Clicks" Mobility  Outcome Measure Help needed turning from your back to your side while in a flat bed without using bedrails?: A Little Help needed moving from lying on your back to sitting on the side of a flat bed without using bedrails?: A Little Help needed moving to and from a bed to a chair (including a wheelchair)?: A Little Help needed standing up from a chair using your arms (e.g., wheelchair or bedside chair)?: A Little Help needed to walk in hospital room?: A Little Help needed climbing 3-5 steps with a railing? : A Lot 6 Click Score: 17    End of Session   Activity Tolerance: Patient tolerated treatment well Patient left: in chair;with call bell/phone within reach;with chair alarm set Nurse Communication: Mobility status PT Visit Diagnosis: Unsteadiness on feet (R26.81);Muscle weakness (generalized) (M62.81);History of falling (Z91.81);Other abnormalities of gait and mobility (R26.89)    Time: 4585-9292 PT Time Calculation (min) (ACUTE ONLY): 19 min   Charges:   PT Evaluation $PT Eval Moderate Complexity: 1 Mod          Shahira Fiske A. Gilford Rile PT, DPT Acute Rehabilitation Services Pager [phone] Office [phone]   Linna Hoff 05/04/2021, 2:38 PM

## 2021-05-04 NOTE — TOC Initial Note (Signed)
Transition of Care [facility]) - Initial/Assessment Note    Patient Details  Name: [name] MRN: [id number] Date of Birth: Dec 10, 1927  Transition of [facility]) CM/SW Contact:    Benard Halsted, [place] Phone Number: 05/04/2021, 4:17 PM  Clinical Narrative:                 Patient has been at [facility] since November. Per [facility], they are able to accept patient on the weekend if medically stable. [facility] sent CSW a lab report that CSW placed on patient's hard chart. MD and RN aware.   Expected Discharge Plan: Skilled Nursing Facility Barriers to Discharge: Continued Medical Work up   Patient Goals and CMS Choice Patient states their goals for this hospitalization and ongoing recovery are:: Return to snf CMS Medicare.gov Compare Post Acute Care list provided to:: Patient Choice offered to / list presented to : Patient  Expected Discharge Plan and Services Expected Discharge Plan: [place] In-house Referral: Clinical Social Work   Post Acute Care Choice: [place] Living arrangements for the past 2 months: [place]                                      Prior Living Arrangements/Services Living arrangements for the past 2 months: [place] Lives with:: Facility Resident Patient language and need for interpreter reviewed:: Yes Do you feel safe going back to the place where you live?: Yes      Need for Family Participation in Patient Care: Yes (Comment) Care giver support system in place?: Yes (comment)   Criminal Activity/Legal Involvement Pertinent to Current Situation/Hospitalization: No - Comment as needed  Activities of Daily Living Home Assistive Devices/Equipment: Cane (specify quad or straight) ADL Screening (condition at time of admission) Patient's cognitive ability adequate to safely complete daily activities?: Yes Is the patient deaf or have difficulty hearing?: No Does the patient have difficulty  seeing, even when wearing glasses/contacts?: No Does the patient have difficulty concentrating, remembering, or making decisions?: No Patient able to express need for assistance with ADLs?: Yes Does the patient have difficulty dressing or bathing?: No Independently performs ADLs?: Yes (appropriate for developmental age) Does the patient have difficulty walking or climbing stairs?: Yes Weakness of Legs: None Weakness of Arms/Hands: None  Permission Sought/Granted Permission sought to share information with : Facility Art therapist granted to share information with : Yes, Verbal Permission Granted     Permission granted to share info w AGENCY: [facility]        Emotional Assessment Appearance:: Appears stated age     Orientation: : Oriented to Self, Oriented to Place, Oriented to  Time, Oriented to Situation Alcohol / Substance Use: Not Applicable Psych Involvement: No (comment)  Admission diagnosis:  Stroke [facility]) [I63.9] Acute ischemic stroke [facility]) [I63.9] Patient Active Problem List   Diagnosis Date Noted   Stroke ([place]) 05/03/2021   COVID-19 virus detected 09/23/2019   Stroke aborted by administration of thrombolytic agent ([place]) - R MCA s/p tPA and mechanical thrombectomy, unk embolic source 90/24/0973   (HFpEF) heart failure with preserved ejection fraction ([place]) 09/19/2019   Morbid obesity ([place]) 09/19/2019   CKD (chronic kidney disease), stage III ([place]) 09/17/2019   Spontaneous subarachnoid hemorrhage ([place]) 10/04/2018   Unstable gait 01/19/2018   Venous stasis dermatitis of both lower extremities 01/09/2018   Diarrhea, chronic 01/09/2018   Lower extremity ulceration ([place])  10/22/2017   Anxiety disorder 10/22/2017   PONV (postoperative nausea and vomiting)    Orthostatic hypotension    Hypercholesterolemia    Exogenous obesity    Rectal bleeding 03/30/2017   GI bleed 03/28/2017   BRBPR (bright red blood per rectum) 03/27/2017   Sigmoid diverticulitis  10/14/2016   Diverticulitis 10/14/2016   Diarrhea of presumed infectious origin 05/11/2016   Varicose veins of bilateral lower extremities with other complications 51/89/8421   Venous insufficiency 08/25/2015   Cellulitis 08/09/2015   Pressure ulcer 08/09/2015   CAD (coronary artery disease) 07/07/2015   Open wound of right foot 07/07/2015   Delusional disorder ([place]) 07/05/2015   Bereavement reaction 07/05/2015   Pacemaker at end of battery life 02/23/2015   Acute GI bleeding 02/15/2015   Acute kidney injury (nontraumatic) ([place]) 02/15/2015   Hypothyroidism 02/15/2015   Left upper quadrant pain    Osteopenia 12/22/2013   Postural dizziness 04/05/2013   S/P placement of cardiac pacemaker 02/26/2013   Bilateral breast cancer ([place]) 03/25/2011   Invasive ductal carcinoma of breast ([place]) 03/30/2007   PCP:  Martin[place], [name], MD Pharmacy:   [place], [place] - [address] [place] [address] Phone: [phone] Fax: [phone]  [id number] [place], [place] [address] [place] [address] Phone: [phone] Fax: [phone]  [facility] [place], [place] [facility] [address] [address] [place] [address] Phone: [phone] Fax: [phone]     Social Determinants of Health (SDOH) Interventions    Readmission Risk Interventions No flowsheet data found.

## 2021-05-05 DIAGNOSIS — I63412 Cerebral infarction due to embolism of left middle cerebral artery: Secondary | ICD-10-CM | POA: Diagnosis not present

## 2021-05-05 DIAGNOSIS — Z8673 Personal history of transient ischemic attack (TIA), and cerebral infarction without residual deficits: Secondary | ICD-10-CM | POA: Diagnosis not present

## 2021-05-05 DIAGNOSIS — I639 Cerebral infarction, unspecified: Secondary | ICD-10-CM

## 2021-05-05 DIAGNOSIS — Z950 Presence of cardiac pacemaker: Secondary | ICD-10-CM

## 2021-05-05 DIAGNOSIS — I484 Atypical atrial flutter: Secondary | ICD-10-CM

## 2021-05-05 DIAGNOSIS — I442 Atrioventricular block, complete: Secondary | ICD-10-CM

## 2021-05-05 DIAGNOSIS — Z8679 Personal history of other diseases of the circulatory system: Secondary | ICD-10-CM

## 2021-05-05 LAB — CBC
HCT: 38.1 % (ref 36.0–46.0)
Hemoglobin: 11.7 g/dL — ABNORMAL LOW (ref 12.0–15.0)
MCH: 30.7 pg (ref 26.0–34.0)
MCHC: 30.7 g/dL (ref 30.0–36.0)
MCV: 100 fL (ref 80.0–100.0)
Platelets: 259 10*3/uL (ref 150–400)
RBC: 3.81 MIL/uL — ABNORMAL LOW (ref 3.87–5.11)
RDW: 15 % (ref 11.5–15.5)
WBC: 8.4 10*3/uL (ref 4.0–10.5)
nRBC: 0 % (ref 0.0–0.2)

## 2021-05-05 LAB — BASIC METABOLIC PANEL
Anion gap: 6 (ref 5–15)
BUN: 11 mg/dL (ref 8–23)
CO2: 28 mmol/L (ref 22–32)
Calcium: 8.1 mg/dL — ABNORMAL LOW (ref 8.9–10.3)
Chloride: 108 mmol/L (ref 98–111)
Creatinine, Ser: 1.15 mg/dL — ABNORMAL HIGH (ref 0.44–1.00)
GFR, Estimated: 45 mL/min — ABNORMAL LOW (ref >60)
Glucose, Bld: 87 mg/dL (ref 70–99)
Potassium: 3.6 mmol/L (ref 3.5–5.1)
Sodium: 142 mmol/L (ref 135–145)

## 2021-05-05 MED ORDER — APIXABAN 5 MG PO TABS: 5.0000 mg | ORAL_TABLET | Freq: Two times a day (BID) | ORAL | 2 refills | Status: DC

## 2021-05-05 MED ORDER — APIXABAN 5 MG PO TABS
5.0000 mg | ORAL_TABLET | Freq: Two times a day (BID) | ORAL | Status: DC
  Administered 2021-05-05: 5 mg via ORAL
  Filled 2021-05-05: qty 1

## 2021-05-05 MED ORDER — ASPIRIN 81 MG PO TBEC: 81.0000 mg | DELAYED_RELEASE_TABLET | Freq: Every day | ORAL | 11 refills | Status: DC

## 2021-05-05 MED ORDER — PANTOPRAZOLE SODIUM 40 MG PO TBEC: 40.0000 mg | DELAYED_RELEASE_TABLET | Freq: Every day | ORAL | 1 refills | Status: DC

## 2021-05-05 MED ORDER — CLOPIDOGREL BISULFATE 75 MG PO TABS: 75.0000 mg | ORAL_TABLET | Freq: Every day | ORAL | 0 refills | Status: DC

## 2021-05-05 MED ORDER — PRAVASTATIN SODIUM 20 MG PO TABS: 20.0000 mg | ORAL_TABLET | Freq: Every day | ORAL | 1 refills | Status: DC

## 2021-05-05 NOTE — Evaluation (Signed)
Speech Language Pathology Evaluation Patient Details Name: [name] MRN: [id number] DOB: [birth year] Today's Date: 05/05/2021 Time: 0102-7253 SLP Time Calculation (min) (ACUTE ONLY): 27 min  Problem List:  Patient Active Problem List   Diagnosis Date Noted   Stroke ([place]) 05/03/2021   COVID-19 virus detected 09/23/2019   Stroke aborted by administration of thrombolytic agent ([place]) - R MCA s/p tPA and mechanical thrombectomy, unk embolic source 66/44/0347   (HFpEF) heart failure with preserved ejection fraction ([place]) 09/19/2019   Morbid obesity ([place]) 09/19/2019   CKD (chronic kidney disease), stage III ([place]) 09/17/2019   Spontaneous subarachnoid hemorrhage ([place]) 10/04/2018   Unstable gait 01/19/2018   Venous stasis dermatitis of both lower extremities 01/09/2018   Diarrhea, chronic 01/09/2018   Lower extremity ulceration ([place]) 10/22/2017   Anxiety disorder 10/22/2017   PONV (postoperative nausea and vomiting)    Orthostatic hypotension    Hypercholesterolemia    Exogenous obesity    Rectal bleeding 03/30/2017   GI bleed 03/28/2017   BRBPR (bright red blood per rectum) 03/27/2017   Sigmoid diverticulitis 10/14/2016   Diverticulitis 10/14/2016   Diarrhea of presumed infectious origin 05/11/2016   Varicose veins of bilateral lower extremities with other complications 42/59/5638   Venous insufficiency 08/25/2015   Cellulitis 08/09/2015   Pressure ulcer 08/09/2015   CAD (coronary artery disease) 07/07/2015   Open wound of right foot 07/07/2015   Delusional disorder ([place]) 07/05/2015   Bereavement reaction 07/05/2015   Pacemaker at end of battery life 02/23/2015   Acute GI bleeding 02/15/2015   Acute kidney injury (nontraumatic) ([place]) 02/15/2015   Hypothyroidism 02/15/2015   Left upper quadrant pain    Osteopenia 12/22/2013   Postural dizziness 04/05/2013   S/P placement of cardiac pacemaker 02/26/2013   Bilateral breast cancer ([place]) 03/25/2011   Invasive ductal  carcinoma of breast ([place]) 03/30/2007   Past Medical History:  Past Medical History:  Diagnosis Date   Cataract    Chronic congestive heart failure, unspecified heart failure type ([place]) 09/19/2019   Complete heart block (HCC)    s/p PPM implant (MDT) by Dr Blanch Media.  Atrial lead could not be paced at time of the procedure.  She has chronic AV dysociation   Delusional disorder ([place])    Exogenous obesity    Hypercholesterolemia    Invasive ductal carcinoma of breast ([place]) 03/2007   BILATERAL BREASTS   Orthostatic hypotension    treated with midodrine by Dr Rollene [name]   PONV (postoperative nausea and vomiting)    Thyroid disease    Venous insufficiency    Past Surgical History:  Past Surgical History:  Procedure Laterality Date   ABDOMINAL HYSTERECTOMY     BREAST LUMPECTOMY Bilateral 2009   CATARACT EXTRACTION     EYE SURGERY X 2   CHOLECYSTECTOMY     COLONOSCOPY N/A 02/16/2015   Procedure: COLONOSCOPY;  Surgeon: Wilford Corner, MD;  Location: [facility] ENDOSCOPY;  Service: Endoscopy;  Laterality: N/A;   EP IMPLANTABLE DEVICE N/A 02/23/2015   pacemaker generator change (MDT Sensia SR) by Dr Rayann Heman    ESOPHAGOGASTRODUODENOSCOPY N/A 02/16/2015   Procedure: ESOPHAGOGASTRODUODENOSCOPY (EGD);  Surgeon: Wilford Corner, MD;  Location: [facility] ENDOSCOPY;  Service: Endoscopy;  Laterality: N/A;   IR CT HEAD LTD  09/21/2019   IR PERCUTANEOUS ART THROMBECTOMY/INFUSION INTRACRANIAL INC DIAG ANGIO  09/21/2019   PACEMAKER INSERTION     MDT implanted by Dr Blanch Media.  Atrial lead placement was unsuccessful.  She has chronic AV dysociation   RADIOLOGY WITH ANESTHESIA N/A 09/21/2019  Procedure: IR WITH ANESTHESIA CODE STROKE;  Surgeon: Radiologist, Medication, MD;  Location: [facility];  Service: Radiology;  Laterality: N/A;   remote right radical nephrectomy  2009   HPI:  88 yo female adm to [facility] with expressive language deficits.  Pt's brain imaging was negative. Pt with PMH + for COVID, prior TIA. She reports to  manage her own bills, medications, etc.  Speech evaluation ordered due to pt's neurological based language concerns.   Assessment / Plan / Recommendation Clinical Impression  SLUMS ([place] Mental Status Exam) -- administered with pt scoring 30/30 - a perfect score!  She demonstrates excellent cognition and language skills. No dysarthria present - nor language deficits.   No focal CN deficits apparent in his remarkable 88 yo.  SLP educated pt to findings/recommendations. No follow up needed.    SLP Assessment  SLP Recommendation/Assessment: Patient does not need any further Speech [place] Pathology Services SLP Visit Diagnosis: Cognitive communication deficit (R41.841)    Recommendations for follow up therapy are one component of a multi-disciplinary discharge planning process, led by the attending physician.  Recommendations may be updated based on patient status, additional functional criteria and insurance authorization.    Follow Up Recommendations  No SLP follow up    Assistance Recommended at Discharge  None  Functional Status Assessment  N/a  Frequency and Duration     N/a      SLP Evaluation Cognition  Overall Cognitive Status: No family/caregiver present to determine baseline cognitive functioning Arousal/Alertness: Awake/alert Orientation Level: Oriented X4 Year: 2023 Month: January Day of Week: Correct Attention: Selective Memory: Appears intact (5/5 words recalled without cue) Awareness: Appears intact Problem Solving: Appears intact Safety/Judgment: Appears intact       Comprehension  Auditory Comprehension Overall Auditory Comprehension: Appears within functional limits for tasks assessed Yes/No Questions: Within Functional Limits Commands: Within Functional Limits Conversation: Complex Visual Recognition/Discrimination Discrimination: Within Function Limits Reading Comprehension Reading Status: Not tested    Expression Expression Primary  Mode of Expression: Verbal Verbal Expression Overall Verbal Expression: Appears within functional limits for tasks assessed Initiation: No impairment Level of Generative/Spontaneous Verbalization: Conversation Repetition: No impairment Naming: No impairment Pragmatics: No impairment Written Expression Dominant Hand: Right Written Expression: Not tested   Oral / Motor  Oral Motor/Sensory Function Overall Oral Motor/Sensory Function: Within functional limits Motor Speech Respiration: Within functional limits Phonation: Normal Resonance: Within functional limits Articulation: Within functional limitis Intelligibility: Intelligible Motor Planning: Witnin functional limits Motor Speech Errors: Not applicable Interfering Components: Premorbid status Effective Techniques: Slow rate            Macario Golds 05/05/2021, 12:38 PM Kathleen Lime, MS [facility] SLP Acute Rehab Services Office [phone] Cell [phone]

## 2021-05-05 NOTE — Progress Notes (Incomplete)
STROKE TEAM PROGRESS NOTE   INTERVAL HISTORY Her RN is at the bedside.  Patient reports her dysarthria and word finding difficulties have improved but she reports not quite being back to baseline yet. She denies any new symptoms at this time.   Discussed plan with patient to plan for discharge back to rehab later today or tomorrow once PT/OT evaluate patient and she gets her CT angio today. Discussed recommendation to resume all home medications besides blood thinners until after 24 hour have passed since TnK administration (1/5 11:00 AM). Patient verbalized understanding and had no other questions at this time.   We received a phone call from social worker late afternoon that patient had oozing from her legs at the end skilled nursing facility which is growing Pseudomonas on culture which is pansensitive.  Patient is currently afebrile with normal white count hence I am not sure we need to treat her with antibiotics for this.  She has a long list of allergies to antibiotics including Cipro which complicates decision-making as to how to treat her Pseudomonas .  Vitals:   05/04/21 2152 05/05/21 0200 05/05/21 0341 05/05/21 0845  BP: 139/76 105/60 (!) 105/55 (!) 95/55  Pulse: 66 60 64 60  Resp: 20 16 18 20   Temp: (!) 97.5 F (36.4 C) 97.6 F (36.4 C) 98.4 F (36.9 C) 98.8 F (37.1 C)  TempSrc: Oral Oral Oral Oral  SpO2: 100% 94% 94% 94%  Weight:       CBC:  Recent Labs  Lab 05/03/21 1107 05/05/21 0205  WBC 7.3 8.4  NEUTROABS 5.1  --   HGB 13.1 11.7*  HCT 41.1 38.1  MCV 100.5* 100.0  PLT 272 324   Basic Metabolic Panel:  Recent Labs  Lab 05/03/21 1035 05/03/21 1041 05/05/21 0205  NA 142 140 142  K 3.6 3.7 3.6  CL 107 106 108  CO2 26  --  28  GLUCOSE 122* 119* 87  BUN 17 21 11   CREATININE 1.22* 1.10* 1.15*  CALCIUM 8.5*  --  8.1*   Lipid Panel:  Recent Labs  Lab 05/04/21 0400  CHOL 126  TRIG 46  HDL 46  CHOLHDL 2.7  VLDL 9  LDLCALC 71   HgbA1c:  Recent Labs   Lab 05/04/21 0400  HGBA1C 5.4   Urine Drug Screen: No results for input(s): LABOPIA, COCAINSCRNUR, LABBENZ, AMPHETMU, THCU, LABBARB in the last 168 hours.  Alcohol Level  Recent Labs  Lab 05/03/21 1035  ETH <10    IMAGING   CTA Head and Neck 05/04/2021 IMPRESSION: 1. Negative for emergent large vessel occlusion. Normalized Right MCA M2 branch which was abnormal in 2021. 2. Otherwise stable CTA since 2021. 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 Mild for age calcified atherosclerosis with no significant arterial stenosis. 3. Stable non contrast CT appearance of the brain. 4. Advanced cervical spine degeneration including increasing spondylolisthesis at the cervicothoracic junction in the setting of cervical spine ankylosis and facet arthropathy.  PHYSICAL EXAM  Temp:  [97.5 F (36.4 C)-98.8 F (37.1 C)] 98.8 F (37.1 C) (01/07 0845) Pulse Rate:  [59-76] 60 (01/07 0845) Resp:  [16-23] 20 (01/07 0845) BP: (95-139)/(51-92) 95/55 (01/07 0845) SpO2:  [89 %-100 %] 94 % (01/07 0845)  General - Well nourished, well developed, in no apparent distress.  Cardiovascular - Regular rhythm and rate.  Mental Status -  Level of arousal and orientation to time, place, and person were intact. Language including expression and comprehension was assessed and  found intact. Minor naming components of objects and repetition were noted. Attention span and concentration were normal. Recent and remote memory were intact. Fund of Knowledge was assessed and was intact.  Cranial Nerves II - XII - II - Visual field intact. III, IV, VI - Extraocular movements intact. V - Facial sensation intact bilaterally. VII - Facial movement intact bilaterally. VIII - Hearing & vestibular intact bilaterally. X - Palate elevates symmetrically. XI - Chin turning & shoulder shrug intact bilaterally. XII - Tongue protrusion intact.  Motor Strength - The patients strength was normal in all  extremities and pronator drift was absent.  Bulk was normal and fasciculations were absent.   Motor Tone - Muscle tone was assessed at the neck and appendages and was normal.  Reflexes - The patients reflexes were symmetrical in all extremities and she had no pathological reflexes.  Sensory - Light touch, temperature/pinprick were assessed and were symmetrical.    Coordination - The patient had normal movements in the hands and feet with no ataxia or dysmetria.  Tremor was absent.  Gait and Station - deferred.  ASSESSMENT/PLAN [name] is a 88 y.o. female with history of CHF, complete heart block s/p permanent pacemaker implanation, hyperlipidemia, invasive ductal carcinoma of the breast, thyroid disease, venous insufficiency, hx of stroke (08/2019 s/p TPA, 09/2018 concerning for cerebellar hemorrhage) presenting with dysarthria and word finding difficulties. She received TnK 1/5 11:00 AM.    Stroke: Suspect small left MCA branch infarct likely secondary due to embolism source Code Stroke CT head No acute abnormality. Atrophy and chronic microvascular ischemic disease in white matter. ASPECTS 10.  CTA head & neck - as above MRI  not performed due to patient's pacemaker Carotid Doppler  scheduled 2D Echo EF 55-60%.  No wall motion abnormalities LDL 71 HgbA1c 5.4 VTE prophylaxis - SCDs    Diet   Diet Heart Room service appropriate? Yes; Fluid consistency: Thin   No antithrombotic prior to admission, now on No antithrombotic. As she just had TnK <24 hours prior Therapy recommendations: PT and OT evaluated patient -> Skilled nursing-short term rehab (<3 hours/day). Speech therapy is not indicated at this time. Disposition:  return to [place] rehab 1/6 or 1/7     Other Stroke Risk Factors Advanced Age >/= 65  Hx stroke/TIA Coronary artery disease Congestive heart failure  Other Active Problems GERD: resume home med pantoprazole 40 mg Pseudomonas on culture of fluid oozing  from her legs at the end skilled nursing facility - Pharmacy note here on 05/04/21 -> "if treatment is desired only oral option would be levofloxacin 250mg  daily based on renal function (rash noted to cipro although patient unable to confirm)" Per Dr Leonie Man -> Plan to hold off on starting antibiotics for Pseudomonas growing from her leg wheezing as she does not appear to have active signs of infection and is afebrile with normal white count.  Right M2 occlusion treated with successful mechanical thrombectomy in May 2021 with excellent clinical recovery -> strong suspicion for paroxysmal A. fib and may need prolonged cardiac monitoring at discharge.  If no bleed on CTA start aspirin for stroke prevention (per Dr Leonie Man) (currently on ASA 81 mg and Plavix 75 mg daily) Patient's power of attorney is [name] Patient presented with sudden onset of speech difficulties, dysarthria and right facial droop and was treated with IV TN K 05/03/2021 with near complete improvement.  CT scan is negative for acute stroke and MRI cannot be obtained because she  has AICD.  Per Dr Leonie Man - Recommend strict blood pressure control  Per Dr Leonie Man - resume home meds upon discharge  Hospital day # 2          To contact Stroke Continuity provider, please refer to [email or web address] After hours, contact General Neurology

## 2021-05-05 NOTE — Plan of Care (Signed)
  Problem: Clinical Measurements: Goal: Diagnostic test results will improve Outcome: Progressing Goal: Respiratory complications will improve Outcome: Progressing Goal: Cardiovascular complication will be avoided Outcome: Progressing   Problem: Activity: Goal: Risk for activity intolerance will decrease Outcome: Progressing   

## 2021-05-05 NOTE — Progress Notes (Signed)
Spoke with Marisue Brooklyn, Nurse at [facility]. Gave report on pt.

## 2021-05-05 NOTE — Progress Notes (Signed)
Pt stated she had a wrist watch while present in the emergency room. I searched pt's belongings but was not able to locate A watch. Called down to the Emergency room as well as the security dest. No watch reported.

## 2021-05-05 NOTE — Progress Notes (Signed)
Received from ICU.  Oriented to room and surroundings. Stroke education begun.

## 2021-05-05 NOTE — Discharge Summary (Addendum)
Patient ID: [name]   MRN: [id number]      DOB: [birth year]  Date of Admission: 05/03/2021 Date of Discharge: 05/05/2021  Attending Physician:  Stroke, Md, MD, Stroke MD Consultant(s):    none Patient's PCP:  Martinique, [name], MD  DISCHARGE DIAGNOSIS:  Possible stroke s/p TNK Lower extremity wound (culture from outside facility reported as Pseudomonas) Hx of aflutter Complete heart block s/p pacemaker Hx of stroke s/p tPA Hx of cerebellar hemorrhage Chronic Kidney Disease IIIa GERD   Past Medical History:  Diagnosis Date   Cataract    Chronic congestive heart failure, unspecified heart failure type ([place]) 09/19/2019   Complete heart block ([place])    s/p PPM implant (MDT) by Dr Blanch Media.  Atrial lead could not be paced at time of the procedure.  She has chronic AV dysociation   Delusional disorder ([place])    Exogenous obesity    Hypercholesterolemia    Invasive ductal carcinoma of breast ([place]) 03/2007   BILATERAL BREASTS   Orthostatic hypotension    treated with midodrine by Dr Rollene [name]   PONV (postoperative nausea and vomiting)    Thyroid disease    Venous insufficiency    Past Surgical History:  Procedure Laterality Date   ABDOMINAL HYSTERECTOMY     BREAST LUMPECTOMY Bilateral 2009   CATARACT EXTRACTION     EYE SURGERY X 2   CHOLECYSTECTOMY     COLONOSCOPY N/A 02/16/2015   Procedure: COLONOSCOPY;  Surgeon: Wilford Corner, MD;  Location: [facility] ENDOSCOPY;  Service: Endoscopy;  Laterality: N/A;   EP IMPLANTABLE DEVICE N/A 02/23/2015   pacemaker generator change (MDT Sensia SR) by Dr Rayann Heman    ESOPHAGOGASTRODUODENOSCOPY N/A 02/16/2015   Procedure: ESOPHAGOGASTRODUODENOSCOPY (EGD);  Surgeon: Wilford Corner, MD;  Location: [facility] ENDOSCOPY;  Service: Endoscopy;  Laterality: N/A;   IR CT HEAD LTD  09/21/2019   IR PERCUTANEOUS ART THROMBECTOMY/INFUSION INTRACRANIAL INC DIAG ANGIO  09/21/2019   PACEMAKER INSERTION     MDT implanted by Dr Blanch Media.  Atrial lead placement was  unsuccessful.  She has chronic AV dysociation   RADIOLOGY WITH ANESTHESIA N/A 09/21/2019   Procedure: IR WITH ANESTHESIA CODE STROKE;  Surgeon: Radiologist, Medication, MD;  Location: [place];  Service: Radiology;  Laterality: N/A;   remote right radical nephrectomy  2009    Family History Family History  Problem Relation Age of Onset   Heart disease Maternal Grandmother    Heart disease Mother     Social History  reports that she has never smoked. She has never used smokeless tobacco. She reports that she does not drink alcohol and does not use drugs.  Allergies as of 05/05/2021       Reactions   Amoxicillin-pot Clavulanate Diarrhea, Nausea And Vomiting   Has patient had a PCN reaction causing immediate rash, facial/tongue/throat swelling, SOB or lightheadedness with hypotension: Yes Has patient had a PCN reaction causing severe rash involving mucus membranes or skin necrosis: No Has patient had a PCN reaction that required hospitalization: No Has patient had a PCN reaction occurring within the last 10 years: Yes If all of the above answers are "NO", then may proceed with Cephalosporin use.   Erythromycin Hives   Tape Other (See Comments)   BAND AIDS-SKIN IRRITATION   Codeine Nausea And Vomiting   Doxycycline Diarrhea   Flagyl [metronidazole] Hives   Macrobid [nitrofurantoin Macrocrystal] Nausea And Vomiting   Tolterodine Nausea And Vomiting   Ciprofloxacin Hives, Rash  Medication List     TAKE these medications    acetaminophen 500 MG tablet Commonly known as: TYLENOL Take 500 mg by mouth in the morning and at bedtime.   apixaban 5 MG Tabs tablet Commonly known as: ELIQUIS Take 1 tablet (5 mg total) by mouth 2 (two) times daily.   busPIRone 5 MG tablet Commonly known as: BUSPAR Take 2.5 mg by mouth 3 (three) times daily.   furosemide 20 MG tablet Commonly known as: LASIX TAKE 1/2 TABLET DAILY   gentamicin cream 0.1 % Commonly known as: GARAMYCIN Apply 1  application topically daily.   latanoprost 0.005 % ophthalmic solution Commonly known as: XALATAN Place 1 drop into both eyes at bedtime.   midodrine 2.5 MG tablet Commonly known as: PROAMATINE TAKE 1 TABLET DAILY AFTER  BREAKFAST What changed: See the new instructions.   pravastatin 20 MG tablet Commonly known as: PRAVACHOL Take 1 tablet (20 mg total) by mouth daily at 6 PM.   PRO-STAT PO Take 30 mLs by mouth in the morning and at bedtime.   Synthroid 50 MCG tablet Generic drug: levothyroxine TAKE 1 TABLET DAILY BEFORE BREAKFAST What changed: how much to take   traMADol 50 MG tablet Commonly known as: ULTRAM Take 1 tablet (50 mg total) by mouth every 8 (eight) hours as needed for up to 10 doses for severe pain.   VITAMIN C PO Take 1 tablet by mouth daily.        HOME MEDICATIONS PRIOR TO ADMISSION Medications Prior to Admission  Medication Sig Dispense Refill   acetaminophen (TYLENOL) 500 MG tablet Take 500 mg by mouth in the morning and at bedtime.     Amino Acids-Protein Hydrolys (PRO-STAT PO) Take 30 mLs by mouth in the morning and at bedtime.     Ascorbic Acid (VITAMIN C PO) Take 1 tablet by mouth daily.     busPIRone (BUSPAR) 5 MG tablet Take 2.5 mg by mouth 3 (three) times daily.     furosemide (LASIX) 20 MG tablet TAKE 1/2 TABLET DAILY (Patient taking differently: Take 10 mg by mouth daily.) 45 tablet 1   gentamicin cream (GARAMYCIN) 0.1 % Apply 1 application topically daily.     latanoprost (XALATAN) 0.005 % ophthalmic solution Place 1 drop into both eyes at bedtime. 2.5 mL 10   midodrine (PROAMATINE) 2.5 MG tablet TAKE 1 TABLET DAILY AFTER  BREAKFAST (Patient taking differently: Take 2.5 mg by mouth daily.) 90 tablet 0   SYNTHROID 50 MCG tablet TAKE 1 TABLET DAILY BEFORE BREAKFAST (Patient taking differently: Take 50 mcg by mouth daily before breakfast.) 90 tablet 0   traMADol (ULTRAM) 50 MG tablet Take 1 tablet (50 mg total) by mouth every 8 (eight) hours as  needed for up to 10 doses for severe pain. (Patient not taking: Reported on 05/03/2021) 10 tablet 0     HOSPITAL MEDICATIONS   stroke: mapping our early stages of recovery book   Does not apply Once   apixaban  5 mg Oral BID   busPIRone  2.5 mg Oral TID   Chlorhexidine Gluconate Cloth  6 each Topical Daily   furosemide  10 mg Oral Daily   latanoprost  1 drop Both Eyes QHS   levothyroxine  50 mcg Oral QAC breakfast   midodrine  2.5 mg Oral Daily   pantoprazole  40 mg Oral QHS   pravastatin  20 mg Oral q1800    LABORATORY STUDIES CBC    Component Value Date/Time   WBC  8.4 05/05/2021 0205   RBC 3.81 (L) 05/05/2021 0205   HGB 11.7 (L) 05/05/2021 0205   HGB 15.3 09/19/2016 1002   HCT 38.1 05/05/2021 0205   HCT 45.5 09/19/2016 1002   PLT 259 05/05/2021 0205   PLT 226 09/19/2016 1002   MCV 100.0 05/05/2021 0205   MCV 93.6 09/19/2016 1002   MCH 30.7 05/05/2021 0205   MCHC 30.7 05/05/2021 0205   RDW 15.0 05/05/2021 0205   RDW 12.7 09/19/2016 1002   LYMPHSABS 1.0 05/03/2021 1107   LYMPHSABS 1.1 09/19/2016 1002   MONOABS 0.7 05/03/2021 1107   MONOABS 0.6 09/19/2016 1002   EOSABS 0.4 05/03/2021 1107   EOSABS 0.5 09/19/2016 1002   BASOSABS 0.0 05/03/2021 1107   BASOSABS 0.1 09/19/2016 1002   CMP    Component Value Date/Time   NA 142 05/05/2021 0205   NA 144 01/28/2020 0935   NA 143 09/19/2016 1002   K 3.6 05/05/2021 0205   K 3.5 09/19/2016 1002   CL 108 05/05/2021 0205   CL 106 03/03/2012 0831   CO2 28 05/05/2021 0205   CO2 27 09/19/2016 1002   GLUCOSE 87 05/05/2021 0205   GLUCOSE 85 09/19/2016 1002   GLUCOSE 124 (H) 03/03/2012 0831   BUN 11 05/05/2021 0205   BUN 17 01/28/2020 0935   BUN 18.2 09/19/2016 1002   CREATININE 1.15 (H) 05/05/2021 0205   CREATININE 1.13 (H) 01/09/2018 1550   CREATININE 1.2 (H) 09/19/2016 1002   CALCIUM 8.1 (L) 05/05/2021 0205   CALCIUM 10.0 09/19/2016 1002   PROT 6.6 05/03/2021 1035   PROT 6.7 01/28/2020 0935   PROT 7.2 09/19/2016 1002    ALBUMIN 3.0 (L) 05/03/2021 1035   ALBUMIN 4.2 01/28/2020 0935   ALBUMIN 3.6 09/19/2016 1002   AST 21 05/03/2021 1035   AST 13 09/19/2016 1002   ALT 13 05/03/2021 1035   ALT 12 09/19/2016 1002   ALKPHOS 120 05/03/2021 1035   ALKPHOS 110 09/19/2016 1002   BILITOT 0.7 05/03/2021 1035   BILITOT 0.5 01/28/2020 0935   BILITOT 0.41 09/19/2016 1002   GFRNONAA 45 (L) 05/05/2021 0205   GFRAA 47 (L) 01/28/2020 0935   COAGS Lab Results  Component Value Date   INR 1.1 05/03/2021   INR 1.0 09/21/2019   INR 0.96 03/30/2017   Lipid Panel    Component Value Date/Time   CHOL 126 05/04/2021 0400   CHOL 141 01/28/2020 0935   TRIG 46 05/04/2021 0400   HDL 46 05/04/2021 0400   HDL 61 01/28/2020 0935   CHOLHDL 2.7 05/04/2021 0400   VLDL 9 05/04/2021 0400   LDLCALC 71 05/04/2021 0400   LDLCALC 66 01/28/2020 0935   HgbA1C  Lab Results  Component Value Date   HGBA1C 5.4 05/04/2021   Urinalysis    Component Value Date/Time   COLORURINE YELLOW 04/02/2019 1836   APPEARANCEUR HAZY (A) 04/02/2019 1836   LABSPEC 1.018 04/02/2019 1836   PHURINE 5.0 04/02/2019 1836   GLUCOSEU NEGATIVE 04/02/2019 1836   HGBUR SMALL (A) 04/02/2019 1836   BILIRUBINUR NEGATIVE 04/02/2019 1836   KETONESUR NEGATIVE 04/02/2019 1836   PROTEINUR 30 (A) 04/02/2019 1836   UROBILINOGEN 0.2 04/12/2014 1928   NITRITE NEGATIVE 04/02/2019 1836   LEUKOCYTESUR LARGE (A) 04/02/2019 1836   Urine Drug Screen     Component Value Date/Time   LABOPIA NONE DETECTED 05/05/2016 0246   COCAINSCRNUR NONE DETECTED 05/05/2016 0246   LABBENZ NONE DETECTED 05/05/2016 0246   AMPHETMU NONE DETECTED 05/05/2016 0246  THCU NONE DETECTED 05/05/2016 0246   LABBARB NONE DETECTED 05/05/2016 0246    Alcohol Level    Component Value Date/Time   ETH <10 05/03/2021 1035     SIGNIFICANT DIAGNOSTIC STUDIES  CT ANGIO HEAD NECK W WO CM  Result Date: 05/04/2021 CLINICAL DATA:  88 year old female status post right MCA ELVO in 2021.  Treated with endovascular thrombectomy. Code stroke presentation yesterday. EXAM: CT ANGIOGRAPHY HEAD AND NECK TECHNIQUE: Multidetector CT imaging of the head and neck was performed using the standard protocol during bolus administration of intravenous contrast. Multiplanar CT image reconstructions and MIPs were obtained to evaluate the vascular anatomy. Carotid stenosis measurements (when applicable) are obtained utilizing NASCET criteria, using the distal internal carotid diameter as the denominator. CONTRAST:  58mL OMNIPAQUE IOHEXOL 350 MG/ML SOLN COMPARISON:  Head CT 05/03/2021.  CTA head and neck 09/21/2019. FINDINGS: CT HEAD Brain: Stable non contrast CT appearance of the brain. Calvarium and skull base: No acute osseous abnormality identified. Paranasal sinuses: Visualized paranasal sinuses and mastoids are stable and well aerated. Orbits: Visualized orbits and scalp soft tissues are within normal limits. CTA NECK Skeleton: Advanced cervical facet arthropathy redemonstrated. There is some lower cervical interbody ankylosis associated with chronic but increased anterolisthesis at the cervicothoracic junction. No acute osseous abnormality identified. Upper chest: Chronic left chest pacemaker. Partially visible enlarged central left pulmonary artery appears patent. Otherwise negative. Other neck: Negative. Aortic arch: 3 vessel arch configuration. Little arch atherosclerosis. Tortuous proximal great vessels. Right carotid system: Tortuosity of the right CCA without plaque or stenosis. Mild to moderate calcified plaque at the right ICA origin and bulb with no stenosis. Left carotid system: Tortuous left CCA without plaque or stenosis. Mild to moderate calcified plaque at the left ICA origin and bulb without stenosis. Severely tortuous left ICA just below the skull base is stable. No stenosis. Vertebral arteries: Tortuous proximal right subclavian artery without plaque or stenosis. Normal vertebral artery origin.  Tortuous right V1 segment. Right vertebral artery is tortuous but patent to the skull base with no plaque or stenosis. Normal proximal left subclavian artery and left vertebral artery origin. Tortuous left vertebral artery throughout the neck and to the skull base but no atherosclerosis or stenosis. CTA HEAD Posterior circulation: Distal vertebral arteries are stable, the right V4 segment is mildly dominant. Tortuous and diminutive vertebrobasilar junction with patent PICA origins. Chronically diminutive basilar artery appears stable with fetal type bilateral PCA origins. Faint patent SCA origins. Bilateral PCA branches are stable. Anterior circulation: Both ICA siphons are patent. Siphon dolichoectasia and mild calcified plaque without stenosis is stable from last year. The left siphon measures up to 8 mm diameter, 7 mm on the right. Normal ophthalmic and posterior communicating artery origins. Patent carotid termini with dominant left and diminutive or absent right ACA A1 segments. Anterior communicating artery and bilateral ACA branches are stable and within normal limits. Left MCA M1 segment and bifurcation are patent without stenosis. Left MCA branches are stable and within normal limits. Right MCA M1 segment and right MCA bifurcation are patent with normalized enhancement of the occluded right M2 branch in 2021. Right MCA branches now are within normal limits. Venous sinuses: Early contrast timing, not well evaluated. Anatomic variants:  Dominant left and diminutive or absent right ACA A1 segments. Dominant right vertebral artery and diminutive basilar on the basis of fetal PCA origins. Review of the MIP images confirms the above findings IMPRESSION: 1. Negative for emergent large vessel occlusion. Normalized Right MCA M2 branch  which was abnormal in 2021. 2. Otherwise stable CTA since 2021. 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 Mild for age calcified atherosclerosis with no significant  arterial stenosis. 3. Stable non contrast CT appearance of the brain. 4. Advanced cervical spine degeneration including increasing spondylolisthesis at the cervicothoracic junction in the setting of cervical spine ankylosis and facet arthropathy. Electronically Signed   By: Genevie Ann M.D.   On: 05/04/2021 11:52   DG Wrist Complete Right  Result Date: 03/08/2021 CLINICAL DATA:  Fall. EXAM: RIGHT WRIST - COMPLETE 3+ VIEW COMPARISON:  None. FINDINGS: Acute transverse fracture through the distal radial metaphysis with mild posterior displacement measuring 2-3 mm. Mild radial sided impaction. Located wrist. STT osteoarthritis. IMPRESSION: Mildly displaced and impacted distal radius fracture. Electronically Signed   By: Jorje Guild M.D.   On: 03/08/2021 06:14   ECHOCARDIOGRAM COMPLETE  Result Date: 05/03/2021    ECHOCARDIOGRAM REPORT   Patient Name:   [name] Date of Exam: 05/03/2021 Medical Rec #:  [id number]        Height:       67.5 in Accession #:    [id number]       Weight:       186.8 lb Date of Birth:  [birth year]       BSA:          1.975 m Patient Age:    65 years         BP:           113/61 mmHg Patient Gender: F                HR:           60 bpm. Exam Location:  Inpatient Procedure: 2D Echo, Cardiac Doppler and Color Doppler Indications:    Stroke  History:        Patient has prior history of Echocardiogram examinations, most                 recent 09/22/2019. CHF, CAD, Stroke; Risk Factors:Dyslipidemia.  Sonographer:    Merrie Roof RDCS Referring Phys: [id number] [facility] IMPRESSIONS  1. DIfficult acoustic windows Apical images are foreshortened. Cannot fullly evaluate regional wall motion. . Left ventricular ejection fraction, by estimation, is 55 to 60%. The left ventricle has normal function. The left ventricle has no regional wall motion abnormalities. The left ventricular internal cavity size was mildly dilated.  2. Right ventricular systolic function is low normal. The right  ventricular size is mildly enlarged. There is moderately elevated pulmonary artery systolic pressure.  3. Left atrial size was severely dilated.  4. Right atrial size was severely dilated.  5. Mild mitral valve regurgitation.  6. Tricuspid valve regurgitation is mild to moderate.  7. The aortic valve is tricuspid. Aortic valve regurgitation is not visualized.  8. The inferior vena cava is dilated in size with <50% respiratory variability, suggesting right atrial pressure of 15 mmHg. Comparison(s): The left ventricular function is unchanged. FINDINGS  Left Ventricle: DIfficult acoustic windows Apical images are foreshortened. Cannot fullly evaluate regional wall motion. Left ventricular ejection fraction, by estimation, is 55 to 60%. The left ventricle has normal function. The left ventricle has no regional wall motion abnormalities. The left ventricular internal cavity size was mildly dilated. There is no left ventricular hypertrophy. Right Ventricle: The right ventricular size is mildly enlarged. Right vetricular wall thickness was not assessed. Right ventricular systolic function is low normal. There is moderately elevated pulmonary artery systolic  pressure. The tricuspid regurgitant velocity is 3.00 m/s, and with an assumed right atrial pressure of 15 mmHg, the estimated right ventricular systolic pressure is 88.9 mmHg. Left Atrium: Left atrial size was severely dilated. Right Atrium: Right atrial size was severely dilated. Pericardium: There is no evidence of pericardial effusion. Mitral Valve: Mild mitral annular calcification. Mild mitral valve regurgitation. Tricuspid Valve: The tricuspid valve is normal in structure. Tricuspid valve regurgitation is mild to moderate. Aortic Valve: The aortic valve is tricuspid. Aortic valve regurgitation is not visualized. Aortic valve mean gradient measures 7.0 mmHg. Aortic valve peak gradient measures 14.1 mmHg. Aortic valve area, by VTI measures 2.02 cm. Pulmonic Valve:  The pulmonic valve was grossly normal. Pulmonic valve regurgitation is trivial. Aorta: The aortic root and ascending aorta are structurally normal, with no evidence of dilitation. Venous: The inferior vena cava is dilated in size with less than 50% respiratory variability, suggesting right atrial pressure of 15 mmHg. IAS/Shunts: No atrial level shunt detected by color flow Doppler. Additional Comments: A device lead is visualized.  LEFT VENTRICLE PLAX 2D LVIDd:         5.30 cm LVIDs:         3.50 cm LV PW:         1.10 cm LV IVS:        1.00 cm LVOT diam:     1.80 cm LV SV:         78 LV SV Index:   39 LVOT Area:     2.54 cm  RIGHT VENTRICLE            IVC RV Basal diam:  4.10 cm    IVC diam: 2.40 cm RV Mid diam:    3.30 cm RV S prime:     8.05 cm/s TAPSE (M-mode): 1.8 cm LEFT ATRIUM             Index        RIGHT ATRIUM           Index LA diam:        5.20 cm 2.63 cm/m   RA Area:     25.80 cm LA Vol (A2C):   58.2 ml 29.46 ml/m  RA Volume:   92.30 ml  46.73 ml/m LA Vol (A4C):   96.6 ml 48.90 ml/m LA Biplane Vol: 83.1 ml 42.07 ml/m  AORTIC VALVE AV Area (Vmax):    1.65 cm AV Area (Vmean):   1.73 cm AV Area (VTI):     2.02 cm AV Vmax:           188.00 cm/s AV Vmean:          123.000 cm/s AV VTI:            0.384 m AV Peak Grad:      14.1 mmHg AV Mean Grad:      7.0 mmHg LVOT Vmax:         122.00 cm/s LVOT Vmean:        83.400 cm/s LVOT VTI:          0.305 m LVOT/AV VTI ratio: 0.79  AORTA Ao Root diam: 3.00 cm Ao Asc diam:  3.10 cm MITRAL VALVE                TRICUSPID VALVE MV Area (PHT): 3.68 cm     TR Peak grad:   36.0 mmHg MV Decel Time: 206 msec     TR Vmax:  300.00 cm/s MV E velocity: 102.00 cm/s MV A velocity: 88.70 cm/s   SHUNTS MV E/A ratio:  1.15         Systemic VTI:  0.30 m                             Systemic Diam: 1.80 cm Dorris Carnes MD Electronically signed by Dorris Carnes MD Signature Date/Time: 05/03/2021/5:29:19 PM    Final    CT HEAD CODE STROKE WO CONTRAST  Result Date:  05/03/2021 CLINICAL DATA:  Code stroke.  Acute neuro deficit.  Aphasia. EXAM: CT HEAD WITHOUT CONTRAST TECHNIQUE: Contiguous axial images were obtained from the base of the skull through the vertex without intravenous contrast. COMPARISON:  CT head 09/22/2019 FINDINGS: Brain: Generalized atrophy. Mild white matter changes with patchy hypodensity in the cerebral white matter bilaterally, unchanged from the prior study. Negative for acute infarct, hemorrhage, mass. Vascular: Negative for hyperdense vessel Skull: Negative Sinuses/Orbits: Mild mucosal edema paranasal sinuses. Prior sinus surgery. Negative orbit Other: None ASPECTS ([place] Stroke Program Early CT Score) - Ganglionic level infarction (caudate, lentiform nuclei, internal capsule, insula, M1-M3 cortex): 7 - Supraganglionic infarction (M4-M6 cortex): 3 Total score (0-10 with 10 being normal): 10 IMPRESSION: 1. No acute abnormality 2. ASPECTS is 10 3. Atrophy and chronic microvascular ischemic change in the white matter. 4. Code stroke imaging results were communicated on 05/03/2021 at 10:59 am to provider Leonie Man via text page Electronically Signed   By: Franchot Gallo M.D.   On: 05/03/2021 10:59       HISTORY OF PRESENT ILLNESS (From H&P by Dr Leonie Man on 05/03/2021) [name] is an 88 y.o. female with past medical history of congestive heart failure, complete heart block s/p permanent pacemaker implantation, hyperlipidemia, invasive ductal carcinoma of the breast, thyroid disease and venous insufficiency.  Patient had a fall on 03/08/2021 and sustained fracture of the right distal radius and was recuperating in rehab at an place.  She was last seen normal at 830 and she was having breakfast.  When her power of attorney arrived at 930 he noticed that she was struggling to speak and finding words and complete sentences.  EMS were called who called a code stroke improved.  Patient states she has had previous history of strokes but has made full recovery  without lasting deficits.  She was independent in actives of daily living prior to her fall and recent admission to rehab.  She is not on any blood thinners and has not had any recent surgeries.  She denies any recent bleeding.  At presently in rehab she is not able to ambulate and uses a wheelchair mostly for ambulation. She has prior history of right MCA infarct on 09/21/2019 treated with IV tPA and successful mechanical thrombectomy and NIH stroke scale improved from 10 on admission to 0 at discharge She also has history of incidental cerebellar vermis hemorrhage in June 2020 source unclear but not felt to be hypertensive possibly a cavernoma but MRI could not be obtained due to patient having pacemaker tracing compatible LSN: 9:30 AM 05/03/2021 tPA Given: Yes after careful discussion of risk-benefit with patient and her health power of attorney NIH stroke scale [place] [name] is a 88 y.o. female with history of CHF, complete heart block s/p permanent pacemaker implanation, hyperlipidemia, invasive ductal carcinoma of the breast, thyroid disease, venous insufficiency, hx of stroke (08/2019 s/p TPA, 09/2018 concerning for  cerebellar hemorrhage) presenting with dysarthria and word finding difficulties. She received TNK 1/5 11:00 AM.     Stroke: Suspect small left MCA branch infarct likely secondary due to embolism from know Aflutter not on [facility] CT head No acute abnormality. Atrophy and chronic microvascular ischemic disease in white matter. ASPECTS 10.  CTA head & neck right M2 now recannulized from previous occlusion in 08/2019 MRI  not performed due to patient's pacemaker 2D Echo EF 55-60%.  No wall motion abnormalities LDL 71 HgbA1c 5.4 VTE prophylaxis -Eliquis No antithrombotic prior to admission, now patient agree with Eliquis 5 mg twice daily for stroke prevention.  Therapy recommendations: SNF Disposition: Back to SNF today  A flutter 10/2019 Cardiac event monitoring  showed atrial flutter with ventricular pacing 01/2020 follow-up in neurology clinic discussed about anticoagulation but patient refused at that time due to previous small cerebellar ICH and vaginal bleeding Now given second time stroke status post thrombolytics, discussed with patient again about anticoagulation.  She agreed to start Eliquis. Close monitoring of bleeding in SNF.  Complete heart block status post pacemaker Pacemaker interrogation shows single chamber pacemaker Not able to tell A. fib or a flutter found single-chamber pacemaker She has follow-up with [name] next months in cardiology office  History of ICH and stroke 09/2018 small cerebellar vermis ICH 8 mm, LDL 87 and A1c 5.8 08/2019 admitted for left-sided weakness, confusion.  CT showed right M2 hyperdense status post tPA.  CTA head and neck showed right M2 occlusion, status post IR with TICI3 reperfusion.  CT repeat showed no acute infarct.  EF 55 to 60%, LDL 89, A1c 6.1, COVID-positive.  Discharged on DAPT for 3 weeks with home health PT/OT.  Other Stroke Risk Factors Advanced Age >/= 22  Coronary artery disease Congestive heart failure   Other Active Problems GERD: resume home med pantoprazole 40 mg Pseudomonas on culture of fluid oozing from her legs at the end skilled nursing facility - Pharmacy note here on 05/04/21 -> "if treatment is desired only oral option would be levofloxacin 250mg  daily based on renal function (rash noted to cipro although patient unable to confirm)" Per Dr Leonie Man -> Plan to hold off on starting antibiotics for Pseudomonas growing from her leg wheezing as she does not appear to have active signs of infection and is afebrile with normal white count.  History of breast cancer Patient's power of attorney is [name]   DISCHARGE EXAM Vitals:   05/05/21 0200 05/05/21 0341 05/05/21 0845 05/05/21 1221  BP: 105/60 (!) 105/55 (!) 95/55 132/86  Pulse: 60 64 60 70  Resp: 16 18 20 16   Temp: 97.6 F  (36.4 C) 98.4 F (36.9 C) 98.8 F (37.1 C) 98.3 F (36.8 C)  TempSrc: Oral Oral Oral Oral  SpO2: 94% 94% 94% 96%  Weight:       General - Well nourished, well developed, in no apparent distress.   Cardiovascular - Regular rhythm and rate.   Mental Status -  Level of arousal and orientation to time, place, and person were intact. Language including expression and comprehension was assessed and found intact. Minor naming components of objects and repetition were noted.  Mild dysarthria   Cranial Nerves II - XII - II - Visual field intact. III, IV, VI - Extraocular movements intact. V - Facial sensation intact bilaterally. VII - Facial movement intact bilaterally. VIII - Hearing & vestibular intact bilaterally. X - Palate elevates symmetrically. XI - Chin turning & shoulder shrug intact bilaterally.  XII - Tongue protrusion intact.   Motor Strength - The patients strength was symmetrical in all extremities and pronator drift was absent.  Bulk was normal and fasciculations were absent.   Motor Tone - Muscle tone was assessed at the neck and appendages and was normal.   Reflexes - The patients reflexes were symmetrical in all extremities and she had no pathological reflexes.   Sensory - Light touch, temperature/pinprick were assessed and were symmetrical.     Coordination - The patient had normal movements in the hands with no ataxia or dysmetria.  Tremor was absent.   Gait and Station - deferred   DISCHARGE DIET   Diet Order             Diet Heart Room service appropriate? Yes; Fluid consistency: Thin  Diet effective now                  liquids  DISCHARGE PLAN Disposition:  Discharge to [facility] Eliquis 5 mg twice daily for secondary stroke prevention Ongoing risk factor control by Primary Care Physician at time of discharge Follow-up Martinique, [name], MD in 2 weeks. Follow-up in [facility] in 4  weeks, office to schedule an appointment.  Follow-up with Cardiology as scheduled in February.  45 minutes were spent preparing discharge.  Rosalin Hawking, MD PhD Stroke Neurology 05/05/2021 7:04 PM

## 2021-05-05 NOTE — TOC Transition Note (Signed)
Transition of Care [facility]) - CM/SW Discharge Note   Patient Details  Name: KENSLEI HEARTY MRN: [id number] Date of Birth: 02-Jan-1928  Transition of Care [facility]) CM/SW Contact:  Coralee Pesa, [place] Phone Number: 05/05/2021, 3:29 PM   Clinical Narrative:     CSW to be transported to [facility] via [facility].  Nurse to call report to [phone].  Final next level of care: Skilled Nursing Facility Barriers to Discharge: Barriers Resolved   Patient Goals and CMS Choice Patient states their goals for this hospitalization and ongoing recovery are:: Return to snf CMS Medicare.gov Compare Post Acute Care list provided to:: Patient Choice offered to / list presented to : Patient  Discharge Placement              Patient chooses bed at: [facility] Patient to be transferred to facility by: [place] Name of family member notified: Patient Patient and family notified of of transfer: 05/05/21  Discharge Plan and Services In-house Referral: Clinical Social Work   Post Acute Care Choice: [place]                               Social Determinants of Health ([place]) Interventions     Readmission Risk Interventions No flowsheet data found.

## 2021-05-06 DIAGNOSIS — I442 Atrioventricular block, complete: Secondary | ICD-10-CM | POA: Diagnosis not present

## 2021-05-06 DIAGNOSIS — R269 Unspecified abnormalities of gait and mobility: Secondary | ICD-10-CM | POA: Diagnosis not present

## 2021-05-06 DIAGNOSIS — L853 Xerosis cutis: Secondary | ICD-10-CM | POA: Diagnosis not present

## 2021-05-06 DIAGNOSIS — Z09 Encounter for follow-up examination after completed treatment for conditions other than malignant neoplasm: Secondary | ICD-10-CM | POA: Diagnosis not present

## 2021-05-06 DIAGNOSIS — Z9114 Patient's other noncompliance with medication regimen: Secondary | ICD-10-CM | POA: Diagnosis not present

## 2021-05-06 DIAGNOSIS — F4322 Adjustment disorder with anxiety: Secondary | ICD-10-CM | POA: Diagnosis not present

## 2021-05-06 DIAGNOSIS — M6281 Muscle weakness (generalized): Secondary | ICD-10-CM | POA: Diagnosis not present

## 2021-05-06 DIAGNOSIS — N183 Chronic kidney disease, stage 3 unspecified: Secondary | ICD-10-CM | POA: Diagnosis not present

## 2021-05-06 DIAGNOSIS — D72829 Elevated white blood cell count, unspecified: Secondary | ICD-10-CM | POA: Diagnosis not present

## 2021-05-06 DIAGNOSIS — D759 Disease of blood and blood-forming organs, unspecified: Secondary | ICD-10-CM | POA: Diagnosis not present

## 2021-05-06 DIAGNOSIS — Z8673 Personal history of transient ischemic attack (TIA), and cerebral infarction without residual deficits: Secondary | ICD-10-CM | POA: Diagnosis not present

## 2021-05-06 DIAGNOSIS — R531 Weakness: Secondary | ICD-10-CM | POA: Diagnosis not present

## 2021-05-06 DIAGNOSIS — S52101D Unspecified fracture of upper end of right radius, subsequent encounter for closed fracture with routine healing: Secondary | ICD-10-CM | POA: Diagnosis not present

## 2021-05-06 DIAGNOSIS — R262 Difficulty in walking, not elsewhere classified: Secondary | ICD-10-CM | POA: Diagnosis not present

## 2021-05-06 DIAGNOSIS — I509 Heart failure, unspecified: Secondary | ICD-10-CM | POA: Diagnosis not present

## 2021-05-06 DIAGNOSIS — Z9189 Other specified personal risk factors, not elsewhere classified: Secondary | ICD-10-CM | POA: Diagnosis not present

## 2021-05-06 DIAGNOSIS — B351 Tinea unguium: Secondary | ICD-10-CM | POA: Diagnosis not present

## 2021-05-06 DIAGNOSIS — I739 Peripheral vascular disease, unspecified: Secondary | ICD-10-CM | POA: Diagnosis not present

## 2021-05-06 DIAGNOSIS — I83028 Varicose veins of left lower extremity with ulcer other part of lower leg: Secondary | ICD-10-CM | POA: Diagnosis not present

## 2021-05-06 DIAGNOSIS — S52551D Other extraarticular fracture of lower end of right radius, subsequent encounter for closed fracture with routine healing: Secondary | ICD-10-CM | POA: Diagnosis not present

## 2021-05-06 DIAGNOSIS — L0390 Cellulitis, unspecified: Secondary | ICD-10-CM | POA: Diagnosis not present

## 2021-05-06 DIAGNOSIS — Z743 Need for continuous supervision: Secondary | ICD-10-CM | POA: Diagnosis not present

## 2021-05-06 DIAGNOSIS — S6291XA Unspecified fracture of right wrist and hand, initial encounter for closed fracture: Secondary | ICD-10-CM | POA: Diagnosis not present

## 2021-05-06 DIAGNOSIS — Z7401 Bed confinement status: Secondary | ICD-10-CM | POA: Diagnosis not present

## 2021-05-06 DIAGNOSIS — I2510 Atherosclerotic heart disease of native coronary artery without angina pectoris: Secondary | ICD-10-CM | POA: Diagnosis not present

## 2021-05-06 DIAGNOSIS — I872 Venous insufficiency (chronic) (peripheral): Secondary | ICD-10-CM | POA: Diagnosis not present

## 2021-05-06 DIAGNOSIS — D2371 Other benign neoplasm of skin of right lower limb, including hip: Secondary | ICD-10-CM | POA: Diagnosis not present

## 2021-05-06 DIAGNOSIS — E039 Hypothyroidism, unspecified: Secondary | ICD-10-CM | POA: Diagnosis not present

## 2021-05-06 DIAGNOSIS — F22 Delusional disorders: Secondary | ICD-10-CM | POA: Diagnosis not present

## 2021-05-06 DIAGNOSIS — F419 Anxiety disorder, unspecified: Secondary | ICD-10-CM | POA: Diagnosis not present

## 2021-05-06 DIAGNOSIS — I70235 Atherosclerosis of native arteries of right leg with ulceration of other part of foot: Secondary | ICD-10-CM | POA: Diagnosis not present

## 2021-05-06 DIAGNOSIS — S6291XD Unspecified fracture of right wrist and hand, subsequent encounter for fracture with routine healing: Secondary | ICD-10-CM | POA: Diagnosis not present

## 2021-05-06 DIAGNOSIS — L97909 Non-pressure chronic ulcer of unspecified part of unspecified lower leg with unspecified severity: Secondary | ICD-10-CM | POA: Diagnosis not present

## 2021-05-06 DIAGNOSIS — I951 Orthostatic hypotension: Secondary | ICD-10-CM | POA: Diagnosis not present

## 2021-05-06 NOTE — Plan of Care (Signed)
°  Problem: Education: Goal: Knowledge of General Education information will improve Description: Including pain rating scale, medication(s)/side effects and non-pharmacologic comfort measures Outcome: Adequate for Discharge   Problem: Health Behavior/Discharge Planning: Goal: Ability to manage health-related needs will improve Outcome: Adequate for Discharge   Problem: Clinical Measurements: Goal: Ability to maintain clinical measurements within normal limits will improve Outcome: Adequate for Discharge Goal: Will remain free from infection Outcome: Adequate for Discharge Goal: Diagnostic test results will improve Outcome: Adequate for Discharge Goal: Respiratory complications will improve Outcome: Adequate for Discharge Goal: Cardiovascular complication will be avoided Outcome: Adequate for Discharge   Problem: Activity: Goal: Risk for activity intolerance will decrease Outcome: Adequate for Discharge   Problem: Nutrition: Goal: Adequate nutrition will be maintained Outcome: Adequate for Discharge   Problem: Coping: Goal: Level of anxiety will decrease Outcome: Adequate for Discharge   Problem: Elimination: Goal: Will not experience complications related to bowel motility Outcome: Adequate for Discharge Goal: Will not experience complications related to urinary retention Outcome: Adequate for Discharge   Problem: Pain Managment: Goal: General experience of comfort will improve Outcome: Adequate for Discharge   Problem: Safety: Goal: Ability to remain free from injury will improve Outcome: Adequate for Discharge   Problem: Skin Integrity: Goal: Risk for impaired skin integrity will decrease Outcome: Adequate for Discharge   Problem: Education: Goal: Knowledge of disease or condition will improve Outcome: Adequate for Discharge Goal: Knowledge of secondary prevention will improve ([facility] ALL) Outcome: Adequate for Discharge Goal: Knowledge of patient specific  risk factors will improve (INDIVIDUALIZE FOR PATIENT) Outcome: Adequate for Discharge Goal: Individualized Educational Video(s) Outcome: Adequate for Discharge   Problem: Coping: Goal: Will verbalize positive feelings about self Outcome: Adequate for Discharge Goal: Will identify appropriate support needs Outcome: Adequate for Discharge   Problem: Health Behavior/Discharge Planning: Goal: Ability to manage health-related needs will improve Outcome: Adequate for Discharge   Problem: Self-Care: Goal: Ability to participate in self-care as condition permits will improve Outcome: Adequate for Discharge Goal: Verbalization of feelings and concerns over difficulty with self-care will improve Outcome: Adequate for Discharge Goal: Ability to communicate needs accurately will improve Outcome: Adequate for Discharge   Problem: Nutrition: Goal: Risk of aspiration will decrease Outcome: Adequate for Discharge Goal: Dietary intake will improve Outcome: Adequate for Discharge   Problem: Ischemic Stroke/TIA Tissue Perfusion: Goal: Complications of ischemic stroke/TIA will be minimized Outcome: Adequate for Discharge

## 2021-05-08 DIAGNOSIS — I872 Venous insufficiency (chronic) (peripheral): Secondary | ICD-10-CM | POA: Diagnosis not present

## 2021-05-08 DIAGNOSIS — I442 Atrioventricular block, complete: Secondary | ICD-10-CM | POA: Diagnosis not present

## 2021-05-08 DIAGNOSIS — I2510 Atherosclerotic heart disease of native coronary artery without angina pectoris: Secondary | ICD-10-CM | POA: Diagnosis not present

## 2021-05-08 DIAGNOSIS — D759 Disease of blood and blood-forming organs, unspecified: Secondary | ICD-10-CM | POA: Diagnosis not present

## 2021-05-08 DIAGNOSIS — I70235 Atherosclerosis of native arteries of right leg with ulceration of other part of foot: Secondary | ICD-10-CM | POA: Diagnosis not present

## 2021-05-08 DIAGNOSIS — N183 Chronic kidney disease, stage 3 unspecified: Secondary | ICD-10-CM | POA: Diagnosis not present

## 2021-05-08 DIAGNOSIS — Z09 Encounter for follow-up examination after completed treatment for conditions other than malignant neoplasm: Secondary | ICD-10-CM | POA: Diagnosis not present

## 2021-05-08 DIAGNOSIS — I951 Orthostatic hypotension: Secondary | ICD-10-CM | POA: Diagnosis not present

## 2021-05-08 DIAGNOSIS — I83028 Varicose veins of left lower extremity with ulcer other part of lower leg: Secondary | ICD-10-CM | POA: Diagnosis not present

## 2021-05-08 DIAGNOSIS — R262 Difficulty in walking, not elsewhere classified: Secondary | ICD-10-CM | POA: Diagnosis not present

## 2021-05-08 DIAGNOSIS — I739 Peripheral vascular disease, unspecified: Secondary | ICD-10-CM | POA: Diagnosis not present

## 2021-05-08 DIAGNOSIS — Z8673 Personal history of transient ischemic attack (TIA), and cerebral infarction without residual deficits: Secondary | ICD-10-CM | POA: Diagnosis not present

## 2021-05-08 DIAGNOSIS — I509 Heart failure, unspecified: Secondary | ICD-10-CM | POA: Diagnosis not present

## 2021-05-08 DIAGNOSIS — L97909 Non-pressure chronic ulcer of unspecified part of unspecified lower leg with unspecified severity: Secondary | ICD-10-CM | POA: Diagnosis not present

## 2021-05-08 DIAGNOSIS — F419 Anxiety disorder, unspecified: Secondary | ICD-10-CM | POA: Diagnosis not present

## 2021-05-08 DIAGNOSIS — E039 Hypothyroidism, unspecified: Secondary | ICD-10-CM | POA: Diagnosis not present

## 2021-05-09 ENCOUNTER — Ambulatory Visit: Payer: Medicare Other | Admitting: Podiatry

## 2021-05-14 DIAGNOSIS — I739 Peripheral vascular disease, unspecified: Secondary | ICD-10-CM | POA: Diagnosis not present

## 2021-05-14 DIAGNOSIS — B351 Tinea unguium: Secondary | ICD-10-CM | POA: Diagnosis not present

## 2021-05-14 DIAGNOSIS — L853 Xerosis cutis: Secondary | ICD-10-CM | POA: Diagnosis not present

## 2021-05-14 DIAGNOSIS — D2371 Other benign neoplasm of skin of right lower limb, including hip: Secondary | ICD-10-CM | POA: Diagnosis not present

## 2021-05-15 DIAGNOSIS — I83028 Varicose veins of left lower extremity with ulcer other part of lower leg: Secondary | ICD-10-CM | POA: Diagnosis not present

## 2021-05-15 DIAGNOSIS — I70235 Atherosclerosis of native arteries of right leg with ulceration of other part of foot: Secondary | ICD-10-CM | POA: Diagnosis not present

## 2021-05-16 DIAGNOSIS — S52551D Other extraarticular fracture of lower end of right radius, subsequent encounter for closed fracture with routine healing: Secondary | ICD-10-CM | POA: Diagnosis not present

## 2021-05-21 ENCOUNTER — Ambulatory Visit (INDEPENDENT_AMBULATORY_CARE_PROVIDER_SITE_OTHER): Payer: Medicare Other

## 2021-05-21 DIAGNOSIS — I442 Atrioventricular block, complete: Secondary | ICD-10-CM

## 2021-05-21 LAB — CUP PACEART REMOTE DEVICE CHECK
Battery Impedance: 3408 Ohm
Battery Remaining Longevity: 12 mo
Battery Voltage: 2.69 V
Brady Statistic RV Percent Paced: 99 %
Date Time Interrogation Session: 20230122144341
Implantable Lead Implant Date: 20141208
Implantable Lead Location: 753860
Implantable Lead Model: 4076
Implantable Pulse Generator Implant Date: 20161027
Lead Channel Impedance Value: 0 Ohm
Lead Channel Impedance Value: 373 Ohm
Lead Channel Pacing Threshold Amplitude: 1.5 V
Lead Channel Pacing Threshold Pulse Width: 0.4 ms
Lead Channel Setting Pacing Amplitude: 3 V
Lead Channel Setting Pacing Pulse Width: 0.46 ms
Lead Channel Setting Sensing Sensitivity: 2 mV

## 2021-05-22 DIAGNOSIS — I70235 Atherosclerosis of native arteries of right leg with ulceration of other part of foot: Secondary | ICD-10-CM | POA: Diagnosis not present

## 2021-05-22 DIAGNOSIS — I83028 Varicose veins of left lower extremity with ulcer other part of lower leg: Secondary | ICD-10-CM | POA: Diagnosis not present

## 2021-05-28 DIAGNOSIS — F4322 Adjustment disorder with anxiety: Secondary | ICD-10-CM | POA: Diagnosis not present

## 2021-05-28 DIAGNOSIS — F22 Delusional disorders: Secondary | ICD-10-CM | POA: Diagnosis not present

## 2021-05-29 DIAGNOSIS — I83028 Varicose veins of left lower extremity with ulcer other part of lower leg: Secondary | ICD-10-CM | POA: Diagnosis not present

## 2021-05-29 DIAGNOSIS — I70235 Atherosclerosis of native arteries of right leg with ulceration of other part of foot: Secondary | ICD-10-CM | POA: Diagnosis not present

## 2021-06-01 NOTE — Progress Notes (Signed)
Remote pacemaker transmission.   

## 2021-06-05 DIAGNOSIS — I83028 Varicose veins of left lower extremity with ulcer other part of lower leg: Secondary | ICD-10-CM | POA: Diagnosis not present

## 2021-06-05 DIAGNOSIS — N183 Chronic kidney disease, stage 3 unspecified: Secondary | ICD-10-CM | POA: Diagnosis not present

## 2021-06-05 DIAGNOSIS — F419 Anxiety disorder, unspecified: Secondary | ICD-10-CM | POA: Diagnosis not present

## 2021-06-05 DIAGNOSIS — I509 Heart failure, unspecified: Secondary | ICD-10-CM | POA: Diagnosis not present

## 2021-06-05 DIAGNOSIS — E039 Hypothyroidism, unspecified: Secondary | ICD-10-CM | POA: Diagnosis not present

## 2021-06-05 DIAGNOSIS — I70235 Atherosclerosis of native arteries of right leg with ulceration of other part of foot: Secondary | ICD-10-CM | POA: Diagnosis not present

## 2021-06-05 DIAGNOSIS — D759 Disease of blood and blood-forming organs, unspecified: Secondary | ICD-10-CM | POA: Diagnosis not present

## 2021-06-07 DIAGNOSIS — L0390 Cellulitis, unspecified: Secondary | ICD-10-CM | POA: Diagnosis not present

## 2021-06-07 DIAGNOSIS — Z9189 Other specified personal risk factors, not elsewhere classified: Secondary | ICD-10-CM | POA: Diagnosis not present

## 2021-06-07 DIAGNOSIS — D72829 Elevated white blood cell count, unspecified: Secondary | ICD-10-CM | POA: Diagnosis not present

## 2021-06-07 DIAGNOSIS — Z9114 Patient's other noncompliance with medication regimen: Secondary | ICD-10-CM | POA: Diagnosis not present

## 2021-06-11 DIAGNOSIS — L97909 Non-pressure chronic ulcer of unspecified part of unspecified lower leg with unspecified severity: Secondary | ICD-10-CM | POA: Diagnosis not present

## 2021-06-11 DIAGNOSIS — S52101D Unspecified fracture of upper end of right radius, subsequent encounter for closed fracture with routine healing: Secondary | ICD-10-CM | POA: Diagnosis not present

## 2021-06-11 DIAGNOSIS — I951 Orthostatic hypotension: Secondary | ICD-10-CM | POA: Diagnosis not present

## 2021-06-11 DIAGNOSIS — I509 Heart failure, unspecified: Secondary | ICD-10-CM | POA: Diagnosis not present

## 2021-06-11 DIAGNOSIS — E039 Hypothyroidism, unspecified: Secondary | ICD-10-CM | POA: Diagnosis not present

## 2021-06-11 DIAGNOSIS — L0390 Cellulitis, unspecified: Secondary | ICD-10-CM | POA: Diagnosis not present

## 2021-06-11 DIAGNOSIS — D72829 Elevated white blood cell count, unspecified: Secondary | ICD-10-CM | POA: Diagnosis not present

## 2021-06-11 DIAGNOSIS — N183 Chronic kidney disease, stage 3 unspecified: Secondary | ICD-10-CM | POA: Diagnosis not present

## 2021-06-11 DIAGNOSIS — Z8673 Personal history of transient ischemic attack (TIA), and cerebral infarction without residual deficits: Secondary | ICD-10-CM | POA: Diagnosis not present

## 2021-06-11 DIAGNOSIS — I872 Venous insufficiency (chronic) (peripheral): Secondary | ICD-10-CM | POA: Diagnosis not present

## 2021-06-14 DIAGNOSIS — Z6832 Body mass index (BMI) 32.0-32.9, adult: Secondary | ICD-10-CM | POA: Diagnosis not present

## 2021-06-14 DIAGNOSIS — Z950 Presence of cardiac pacemaker: Secondary | ICD-10-CM | POA: Diagnosis not present

## 2021-06-14 DIAGNOSIS — I088 Other rheumatic multiple valve diseases: Secondary | ICD-10-CM | POA: Diagnosis not present

## 2021-06-14 DIAGNOSIS — E7800 Pure hypercholesterolemia, unspecified: Secondary | ICD-10-CM | POA: Diagnosis not present

## 2021-06-14 DIAGNOSIS — I951 Orthostatic hypotension: Secondary | ICD-10-CM | POA: Diagnosis not present

## 2021-06-14 DIAGNOSIS — I2510 Atherosclerotic heart disease of native coronary artery without angina pectoris: Secondary | ICD-10-CM | POA: Diagnosis not present

## 2021-06-14 DIAGNOSIS — I890 Lymphedema, not elsewhere classified: Secondary | ICD-10-CM | POA: Diagnosis not present

## 2021-06-14 DIAGNOSIS — Z905 Acquired absence of kidney: Secondary | ICD-10-CM | POA: Diagnosis not present

## 2021-06-14 DIAGNOSIS — N1831 Chronic kidney disease, stage 3a: Secondary | ICD-10-CM | POA: Diagnosis not present

## 2021-06-14 DIAGNOSIS — I872 Venous insufficiency (chronic) (peripheral): Secondary | ICD-10-CM | POA: Diagnosis not present

## 2021-06-14 DIAGNOSIS — S52591D Other fractures of lower end of right radius, subsequent encounter for closed fracture with routine healing: Secondary | ICD-10-CM | POA: Diagnosis not present

## 2021-06-14 DIAGNOSIS — I4892 Unspecified atrial flutter: Secondary | ICD-10-CM | POA: Diagnosis not present

## 2021-06-14 DIAGNOSIS — I130 Hypertensive heart and chronic kidney disease with heart failure and stage 1 through stage 4 chronic kidney disease, or unspecified chronic kidney disease: Secondary | ICD-10-CM | POA: Diagnosis not present

## 2021-06-14 DIAGNOSIS — L97821 Non-pressure chronic ulcer of other part of left lower leg limited to breakdown of skin: Secondary | ICD-10-CM | POA: Diagnosis not present

## 2021-06-14 DIAGNOSIS — E039 Hypothyroidism, unspecified: Secondary | ICD-10-CM | POA: Diagnosis not present

## 2021-06-14 DIAGNOSIS — K219 Gastro-esophageal reflux disease without esophagitis: Secondary | ICD-10-CM | POA: Diagnosis not present

## 2021-06-14 DIAGNOSIS — I5032 Chronic diastolic (congestive) heart failure: Secondary | ICD-10-CM | POA: Diagnosis not present

## 2021-06-14 DIAGNOSIS — B965 Pseudomonas (aeruginosa) (mallei) (pseudomallei) as the cause of diseases classified elsewhere: Secondary | ICD-10-CM | POA: Diagnosis not present

## 2021-06-14 DIAGNOSIS — Z8673 Personal history of transient ischemic attack (TIA), and cerebral infarction without residual deficits: Secondary | ICD-10-CM | POA: Diagnosis not present

## 2021-06-14 DIAGNOSIS — E6609 Other obesity due to excess calories: Secondary | ICD-10-CM | POA: Diagnosis not present

## 2021-06-14 DIAGNOSIS — Z853 Personal history of malignant neoplasm of breast: Secondary | ICD-10-CM | POA: Diagnosis not present

## 2021-06-14 DIAGNOSIS — I739 Peripheral vascular disease, unspecified: Secondary | ICD-10-CM | POA: Diagnosis not present

## 2021-06-14 DIAGNOSIS — Z7902 Long term (current) use of antithrombotics/antiplatelets: Secondary | ICD-10-CM | POA: Diagnosis not present

## 2021-06-14 DIAGNOSIS — S80911D Unspecified superficial injury of right knee, subsequent encounter: Secondary | ICD-10-CM | POA: Diagnosis not present

## 2021-06-14 DIAGNOSIS — F419 Anxiety disorder, unspecified: Secondary | ICD-10-CM | POA: Diagnosis not present

## 2021-06-14 NOTE — Progress Notes (Signed)
HPI: [name] is a 88 y.o. female with hx of congestive heart failure, complete heart block s/p permanent pacemaker implantation, hyperlipidemia, invasive ductal carcinoma of the breast, thyroid disease and venous insufficiency here today with her neighbor to follow on recent hospitalization. She was admitted on 05/03/21 and discharged back to SNF on 05/05/21 [facility]). Discharged home on 06/11/21. She was last seen in the office on 10/08/19. I do not see transition of care call.  Fell on 70/01/74 complicated by fracture of right distal radius, she was completing rehab at a SNF. Her POA was eating with her, she remembers that she could say what she was thinking because "heavy tongue "sensation and slurred speach. She was transported to the ED via EMS. She recovered with no residual deficit. Discharged on Eliquis 5 mg bid for stroke prevention.  Right MCA infarct on 09/21/19 treated with IV tPA and successful mechanical thrombectomy and fully recovered with no residual deficit.  She is on Pravastatin 20 mg daily. Lab Results  Component Value Date   CHOL 126 05/04/2021   HDL 46 05/04/2021   LDLCALC 71 05/04/2021   TRIG 46 05/04/2021   CHOLHDL 2.7 05/04/2021   Orthostatic hypotension:She is on midodrine 2.5 mg daily. She is no checking BP's at home. Denies severe/frequent headache, visual changes, chest pain, dyspnea, palpitation,focal weakness, or worsening edema.  Lab Results  Component Value Date   CREATININE 1.15 (H) 05/05/2021   BUN 11 05/05/2021   NA 142 05/05/2021   K 3.6 05/05/2021   CL 108 05/05/2021   CO2 28 05/05/2021   Lab Results  Component Value Date   WBC 8.4 05/05/2021   HGB 11.7 (L) 05/05/2021   HCT 38.1 05/05/2021   MCV 100.0 05/05/2021   PLT 259 05/05/2021   Hypothyroidism on Synthroid 50 mcg daily. Lab Results  Component Value Date   TSH 2.72 09/17/2019   Anxiety: Buspar was added, she is taking 2.5 mg tid.  Venous stasis dermatitis LE's with  left LE ulcer. Wound care through [facility] 3 times per week. Her neighbor helped her to change dressing yesterday because it was wet.  She has difficulty getting on and off bed, so sleeping on a couch with legs down.  She is on abx, she does not remember name. Causing GI symptoms (diarrhea), she is taking probiotic, which has helped. 2 stools per day. One more week left of abx, which she is taking bid.   HH evaluation completed, she is supposed to start PT 2 times per week and PT once per week. She is using a walker at home.  Review of Systems  Constitutional:  Positive for activity change. Negative for appetite change and fever.  HENT:  Negative for nosebleeds and sore throat.   Respiratory:  Negative for cough and wheezing.   Gastrointestinal:  Negative for abdominal pain, nausea and vomiting.       Negative for changes in bowel habits.  Genitourinary:  Negative for decreased urine volume, dysuria and hematuria.  Musculoskeletal:  Positive for gait problem.  Skin:  Positive for wound.  Neurological:  Negative for syncope, facial asymmetry and weakness.  Psychiatric/Behavioral:  Negative for confusion. The patient is nervous/anxious.   Rest see pertinent positives and negatives per HPI.  Current Outpatient Medications on File Prior to Visit  Medication Sig Dispense Refill   acetaminophen (TYLENOL) 500 MG tablet Take 500 mg by mouth in the morning and at bedtime.     Amino Acids-Protein Hydrolys (PRO-STAT PO) Take 30  mLs by mouth in the morning and at bedtime.     apixaban (ELIQUIS) 5 MG TABS tablet Take 1 tablet (5 mg total) by mouth 2 (two) times daily. 60 tablet 2   Ascorbic Acid (VITAMIN C PO) Take 1 tablet by mouth daily.     busPIRone (BUSPAR) 5 MG tablet Take 2.5 mg by mouth 3 (three) times daily.     furosemide (LASIX) 20 MG tablet TAKE 1/2 TABLET DAILY (Patient taking differently: Take 10 mg by mouth daily.) 45 tablet 1   gentamicin cream (GARAMYCIN) 0.1 % Apply 1 application  topically daily.     latanoprost (XALATAN) 0.005 % ophthalmic solution Place 1 drop into both eyes at bedtime. 2.5 mL 10   midodrine (PROAMATINE) 2.5 MG tablet TAKE 1 TABLET DAILY AFTER  BREAKFAST (Patient taking differently: Take 2.5 mg by mouth daily.) 90 tablet 0   pravastatin (PRAVACHOL) 20 MG tablet Take 1 tablet (20 mg total) by mouth daily at 6 PM. 30 tablet 1   SYNTHROID 50 MCG tablet TAKE 1 TABLET DAILY BEFORE BREAKFAST (Patient taking differently: Take 50 mcg by mouth daily before breakfast.) 90 tablet 0   traMADol (ULTRAM) 50 MG tablet Take 1 tablet (50 mg total) by mouth every 8 (eight) hours as needed for up to 10 doses for severe pain. 10 tablet 0   No current facility-administered medications on file prior to visit.   Past Medical History:  Diagnosis Date   Cataract    Chronic congestive heart failure, unspecified heart failure type ([place]) 09/19/2019   Complete heart block (HCC)    s/p PPM implant (MDT) by Dr [name].  Atrial lead could not be paced at time of the procedure.  She has chronic AV dysociation   Delusional disorder ([place])    Exogenous obesity    Hypercholesterolemia    Invasive ductal carcinoma of breast ([place]) 03/2007   BILATERAL BREASTS   Orthostatic hypotension    treated with midodrine by Dr [name]   PONV (postoperative nausea and vomiting)    Thyroid disease    Venous insufficiency    Allergies  Allergen Reactions   Amoxicillin-Pot Clavulanate Diarrhea and Nausea And Vomiting    Has patient had a PCN reaction causing immediate rash, facial/tongue/throat swelling, SOB or lightheadedness with hypotension: Yes Has patient had a PCN reaction causing severe rash involving mucus membranes or skin necrosis: No Has patient had a PCN reaction that required hospitalization: No Has patient had a PCN reaction occurring within the last 10 years: Yes If all of the above answers are "NO", then may proceed with Cephalosporin use.    Erythromycin Hives   Tape Other  (See Comments)    BAND AIDS-SKIN IRRITATION   Codeine Nausea And Vomiting   Doxycycline Diarrhea   Flagyl [Metronidazole] Hives   Macrobid [Nitrofurantoin Macrocrystal] Nausea And Vomiting   Tolterodine Nausea And Vomiting   Ciprofloxacin Hives and Rash    Social History   Socioeconomic History   Marital status: Widowed    Spouse name: Not on file   Number of children: Not on file   Years of education: Not on file   Highest education level: Not on file  Occupational History   Not on file  Tobacco Use   Smoking status: Never   Smokeless tobacco: Never  Vaping Use   Vaping Use: Never used  Substance and Sexual Activity   Alcohol use: No    Alcohol/week: 0.0 standard drinks   Drug use: No   Sexual  activity: Not Currently  Other Topics Concern   Not on file  Social History Narrative   Not on file   Social Determinants of Health   Financial Resource Strain: Low Risk    Difficulty of Paying Living Expenses: Not hard at all  Food Insecurity: No Food Insecurity   Worried About Charity fundraiser in the Last Year: Never true   [place] in the Last Year: Never true  Transportation Needs: No Transportation Needs   Lack of Transportation (Medical): No   Lack of Transportation (Non-Medical): No  Physical Activity: Inactive   Days of Exercise per Week: 0 days   Minutes of Exercise per Session: 0 min  Stress: No Stress Concern Present   Feeling of Stress : Not at all  Social Connections: Moderately Isolated   Frequency of Communication with Friends and Family: Twice a week   Frequency of Social Gatherings with Friends and Family: Twice a week   Attends Religious Services: More than 4 times per year   Active Member of [employer] or Organizations: No   Attends Archivist Meetings: Never   Marital Status: Widowed   Vitals:   06/15/21 0822  BP: 120/60  Pulse: 63  Resp: 16  SpO2: 90%   Body mass index is 28.83 kg/m.  Physical Exam Vitals and nursing note  reviewed.  Constitutional:      General: She is not in acute distress.    Appearance: She is well-developed.  HENT:     Head: Normocephalic and atraumatic.     Mouth/Throat:     Mouth: Mucous membranes are moist.     Pharynx: Oropharynx is clear.  Eyes:     Conjunctiva/sclera: Conjunctivae normal.  Cardiovascular:     Rate and Rhythm: Normal rate and regular rhythm.     Heart sounds: No murmur heard.    Comments: DP pulses present. Pulmonary:     Effort: Pulmonary effort is normal. No respiratory distress.     Breath sounds: Normal breath sounds.  Abdominal:     Palpations: Abdomen is soft.     Tenderness: There is no abdominal tenderness.  Lymphadenopathy:     Cervical: No cervical adenopathy.  Skin:    General: Skin is warm.     Findings: No erythema or rash.       Neurological:     General: No focal deficit present.     Mental Status: She is alert and oriented to person, place, and time.     Cranial Nerves: No cranial nerve deficit.     Comments: In a wheel chair.  Psychiatric:        Mood and Affect: Mood is anxious.     Comments: Well groomed, good eye contact.   ASSESSMENT AND PLAN:  Ms.Kimiko was seen today for hospitalization follow-up.  Diagnoses and all orders for this visit: Orders Placed This Encounter  Procedures   TSH   Venous stasis ulcer of left lower extremity ([place]) Complete abx treatment. Keep wound clean with soap and water. Uncovered for 1-2 hours daily, cold compressions of domeboro mixed with water and couple times per day may help to keep it dry. LE elevation. Continue wound care through [facility].  -     aluminum sulfate-calcium acetate (DOMEBORO) packet; Apply 1 packet topically daily as needed (Mixed with water,lukewarm and apply compressions on affected area.).  Stage 3b chronic kidney disease (HCC) Cr 1.1-1.2 and e GFR 40's. Continue adequate hydration,low salt diet,and avoidance of NSAID's.  Adequate BP controlled.  Venous stasis  dermatitis of both lower extremities Continue Furosemide 20 mg daily and appropriate skin care. It is hard for her to put on compression stockings.  Hypothyroidism Problem has been well controlled. Continue Synthroid 50 mcg daily. Further recommendations according to TSH result.  Orthostatic hypotension BP adequately controlled. Continue Midodrine 2.5 mg daily. Monitor BP regularly. Has an appt with Dr Peter Martinique 06/26/21.  Anxiety disorder Reluctant to continue medication, strongly recommend continuing BuSpar 5 mg 1/2 tab tid.  (HFpEF) heart failure with preserved ejection fraction (HCC) Continue low salt diet and Furosemide same dose. Has appt with cardiologist in a few days.  I spent a total of 56 minutes in both face to face and non face to face activities for this visit on the date of this encounter. During this time history was obtained and documented, examination was performed, prior labs/imaging reviewed, and assessment/plan discussed. She has appt with neuro on 07/04/21. Cardiology appt 06/26/21.  Return in about 3 months (around 09/12/2021).  Kenyatte Gruber G. Martinique, MD  [facility] [place] office.

## 2021-06-15 ENCOUNTER — Encounter: Payer: Self-pay | Admitting: Family Medicine

## 2021-06-15 ENCOUNTER — Ambulatory Visit (INDEPENDENT_AMBULATORY_CARE_PROVIDER_SITE_OTHER): Payer: Medicare Other | Admitting: Family Medicine

## 2021-06-15 ENCOUNTER — Telehealth: Payer: Self-pay | Admitting: Family Medicine

## 2021-06-15 VITALS — BP 120/60 | HR 63 | Resp 16 | Ht 67.5 in

## 2021-06-15 DIAGNOSIS — N1832 Chronic kidney disease, stage 3b: Secondary | ICD-10-CM | POA: Diagnosis not present

## 2021-06-15 DIAGNOSIS — I5032 Chronic diastolic (congestive) heart failure: Secondary | ICD-10-CM | POA: Diagnosis not present

## 2021-06-15 DIAGNOSIS — F419 Anxiety disorder, unspecified: Secondary | ICD-10-CM

## 2021-06-15 DIAGNOSIS — I872 Venous insufficiency (chronic) (peripheral): Secondary | ICD-10-CM | POA: Diagnosis not present

## 2021-06-15 DIAGNOSIS — E039 Hypothyroidism, unspecified: Secondary | ICD-10-CM | POA: Diagnosis not present

## 2021-06-15 DIAGNOSIS — I639 Cerebral infarction, unspecified: Secondary | ICD-10-CM

## 2021-06-15 DIAGNOSIS — I83029 Varicose veins of left lower extremity with ulcer of unspecified site: Secondary | ICD-10-CM

## 2021-06-15 DIAGNOSIS — L97929 Non-pressure chronic ulcer of unspecified part of left lower leg with unspecified severity: Secondary | ICD-10-CM

## 2021-06-15 DIAGNOSIS — I951 Orthostatic hypotension: Secondary | ICD-10-CM | POA: Diagnosis not present

## 2021-06-15 MED ORDER — ALUM SULFATE-CA ACETATE EX PACK: 1.0000 | PACK | Freq: Every day | CUTANEOUS | 0 refills | Status: DC | PRN

## 2021-06-15 NOTE — Assessment & Plan Note (Signed)
BP adequately controlled. Continue Midodrine 2.5 mg daily. Monitor BP regularly. Has an appt with Dr Peter Martinique 06/26/21.

## 2021-06-15 NOTE — Patient Instructions (Addendum)
A few things to remember from today's visit:  Hypothyroidism, unspecified type - Plan: TSH  Stage 3b chronic kidney disease ([place])  Orthostatic hypotension  Venous stasis dermatitis of both lower extremities  Venous stasis ulcer of left lower extremity ([place])  If you need refills please call your pharmacy. Do not use My Chart to request refills or for acute issues that need immediate attention.   If possible uncovered wound for 2 hours daily to help it dry. Lower extremities elevation above heart level. Compression of domeboro on areas that are weeping. Complete antibiotic treatment. Continue Align 1 caps daily.  No changes in medications today. Monitor blood pressure at home. Keep appt with neuro and cardiologist.  Please be sure medication list is accurate. If a new problem present, please set up appointment sooner than planned today.

## 2021-06-15 NOTE — Telephone Encounter (Signed)
Keke with [facility] called in requesting verbal orders for the patient. The verbal orders is plan of care approval for 2 week 7 for wound care . Raul Del is also requesting an order for medical social worker 1 week 1.   Raul Del could be contacted at [phone].  Please advise.

## 2021-06-15 NOTE — Assessment & Plan Note (Signed)
Reluctant to continue medication, strongly recommend continuing BuSpar 5 mg 1/2 tab tid.

## 2021-06-15 NOTE — Assessment & Plan Note (Signed)
Continue low salt diet and Furosemide same dose. Has appt with cardiologist in a few days.

## 2021-06-15 NOTE — Assessment & Plan Note (Signed)
Continue Furosemide 20 mg daily and appropriate skin care. It is hard for her to put on compression stockings.

## 2021-06-15 NOTE — Telephone Encounter (Signed)
I called and spoke with KeKe, verbal given.

## 2021-06-15 NOTE — Assessment & Plan Note (Signed)
Problem has been well controlled. Continue Synthroid 50 mcg daily. Further recommendations according to TSH result.

## 2021-06-18 ENCOUNTER — Telehealth: Payer: Self-pay | Admitting: Family Medicine

## 2021-06-18 ENCOUNTER — Other Ambulatory Visit: Payer: Self-pay

## 2021-06-18 DIAGNOSIS — I739 Peripheral vascular disease, unspecified: Secondary | ICD-10-CM | POA: Diagnosis not present

## 2021-06-18 DIAGNOSIS — I130 Hypertensive heart and chronic kidney disease with heart failure and stage 1 through stage 4 chronic kidney disease, or unspecified chronic kidney disease: Secondary | ICD-10-CM | POA: Diagnosis not present

## 2021-06-18 DIAGNOSIS — I872 Venous insufficiency (chronic) (peripheral): Secondary | ICD-10-CM | POA: Diagnosis not present

## 2021-06-18 DIAGNOSIS — L97821 Non-pressure chronic ulcer of other part of left lower leg limited to breakdown of skin: Secondary | ICD-10-CM | POA: Diagnosis not present

## 2021-06-18 DIAGNOSIS — I2510 Atherosclerotic heart disease of native coronary artery without angina pectoris: Secondary | ICD-10-CM | POA: Diagnosis not present

## 2021-06-18 DIAGNOSIS — B965 Pseudomonas (aeruginosa) (mallei) (pseudomallei) as the cause of diseases classified elsewhere: Secondary | ICD-10-CM | POA: Diagnosis not present

## 2021-06-18 MED ORDER — MIDODRINE HCL 2.5 MG PO TABS: 2.5000 mg | ORAL_TABLET | Freq: Every day | ORAL | 2 refills | Status: DC

## 2021-06-18 NOTE — Telephone Encounter (Signed)
Anissa lpn [facility] is calling pt had a fall on Friday 06-15-2021 and injury her right knee and has skin tear and anissa would like order for wound care. Pt also states she had a fall last night no injuries

## 2021-06-19 DIAGNOSIS — L97821 Non-pressure chronic ulcer of other part of left lower leg limited to breakdown of skin: Secondary | ICD-10-CM | POA: Diagnosis not present

## 2021-06-19 DIAGNOSIS — I739 Peripheral vascular disease, unspecified: Secondary | ICD-10-CM | POA: Diagnosis not present

## 2021-06-19 DIAGNOSIS — I130 Hypertensive heart and chronic kidney disease with heart failure and stage 1 through stage 4 chronic kidney disease, or unspecified chronic kidney disease: Secondary | ICD-10-CM | POA: Diagnosis not present

## 2021-06-19 DIAGNOSIS — I872 Venous insufficiency (chronic) (peripheral): Secondary | ICD-10-CM | POA: Diagnosis not present

## 2021-06-19 DIAGNOSIS — I2510 Atherosclerotic heart disease of native coronary artery without angina pectoris: Secondary | ICD-10-CM | POA: Diagnosis not present

## 2021-06-19 DIAGNOSIS — B965 Pseudomonas (aeruginosa) (mallei) (pseudomallei) as the cause of diseases classified elsewhere: Secondary | ICD-10-CM | POA: Diagnosis not present

## 2021-06-19 NOTE — Telephone Encounter (Signed)
VO given.

## 2021-06-20 ENCOUNTER — Telehealth: Payer: Self-pay | Admitting: Family Medicine

## 2021-06-20 NOTE — Telephone Encounter (Signed)
Needs [name] to verbally sign off on PT orders--just an ok that when they send the therapy plan she will sign off on it.

## 2021-06-20 NOTE — Telephone Encounter (Signed)
Spoke with [name], he is aware pcp will sign off on orders.

## 2021-06-21 ENCOUNTER — Telehealth: Payer: Self-pay | Admitting: Family Medicine

## 2021-06-21 DIAGNOSIS — I872 Venous insufficiency (chronic) (peripheral): Secondary | ICD-10-CM | POA: Diagnosis not present

## 2021-06-21 DIAGNOSIS — I130 Hypertensive heart and chronic kidney disease with heart failure and stage 1 through stage 4 chronic kidney disease, or unspecified chronic kidney disease: Secondary | ICD-10-CM | POA: Diagnosis not present

## 2021-06-21 DIAGNOSIS — B965 Pseudomonas (aeruginosa) (mallei) (pseudomallei) as the cause of diseases classified elsewhere: Secondary | ICD-10-CM | POA: Diagnosis not present

## 2021-06-21 DIAGNOSIS — I739 Peripheral vascular disease, unspecified: Secondary | ICD-10-CM | POA: Diagnosis not present

## 2021-06-21 DIAGNOSIS — L97821 Non-pressure chronic ulcer of other part of left lower leg limited to breakdown of skin: Secondary | ICD-10-CM | POA: Diagnosis not present

## 2021-06-21 DIAGNOSIS — I2510 Atherosclerotic heart disease of native coronary artery without angina pectoris: Secondary | ICD-10-CM | POA: Diagnosis not present

## 2021-06-21 NOTE — Telephone Encounter (Signed)
[name] from [facility] call and stated she need the new order for pt.Her # is [phone].

## 2021-06-22 DIAGNOSIS — B965 Pseudomonas (aeruginosa) (mallei) (pseudomallei) as the cause of diseases classified elsewhere: Secondary | ICD-10-CM | POA: Diagnosis not present

## 2021-06-22 DIAGNOSIS — L97821 Non-pressure chronic ulcer of other part of left lower leg limited to breakdown of skin: Secondary | ICD-10-CM | POA: Diagnosis not present

## 2021-06-22 DIAGNOSIS — I872 Venous insufficiency (chronic) (peripheral): Secondary | ICD-10-CM | POA: Diagnosis not present

## 2021-06-22 DIAGNOSIS — I2510 Atherosclerotic heart disease of native coronary artery without angina pectoris: Secondary | ICD-10-CM | POA: Diagnosis not present

## 2021-06-22 DIAGNOSIS — I130 Hypertensive heart and chronic kidney disease with heart failure and stage 1 through stage 4 chronic kidney disease, or unspecified chronic kidney disease: Secondary | ICD-10-CM | POA: Diagnosis not present

## 2021-06-22 DIAGNOSIS — I739 Peripheral vascular disease, unspecified: Secondary | ICD-10-CM | POA: Diagnosis not present

## 2021-06-22 NOTE — Telephone Encounter (Signed)
Santiago Glad with [facility] would like to change the verbal order for social to next week instead because patient wasn't able to make it.   Santiago Glad would like to be contacted at [phone].  Please advise.

## 2021-06-22 NOTE — Telephone Encounter (Signed)
I tried contacting [name], but unable to leave a message. We need to know what the orders are for.

## 2021-06-22 NOTE — Telephone Encounter (Signed)
VO given to Santiago Glad.

## 2021-06-25 DIAGNOSIS — I739 Peripheral vascular disease, unspecified: Secondary | ICD-10-CM | POA: Diagnosis not present

## 2021-06-25 DIAGNOSIS — L97821 Non-pressure chronic ulcer of other part of left lower leg limited to breakdown of skin: Secondary | ICD-10-CM | POA: Diagnosis not present

## 2021-06-25 DIAGNOSIS — I2510 Atherosclerotic heart disease of native coronary artery without angina pectoris: Secondary | ICD-10-CM | POA: Diagnosis not present

## 2021-06-25 DIAGNOSIS — B965 Pseudomonas (aeruginosa) (mallei) (pseudomallei) as the cause of diseases classified elsewhere: Secondary | ICD-10-CM | POA: Diagnosis not present

## 2021-06-25 DIAGNOSIS — I872 Venous insufficiency (chronic) (peripheral): Secondary | ICD-10-CM | POA: Diagnosis not present

## 2021-06-25 DIAGNOSIS — I130 Hypertensive heart and chronic kidney disease with heart failure and stage 1 through stage 4 chronic kidney disease, or unspecified chronic kidney disease: Secondary | ICD-10-CM | POA: Diagnosis not present

## 2021-06-25 NOTE — Telephone Encounter (Signed)
I called and spoke with [name].   They will do compression dressings, leave uncovered for 1-2 hours daily, and use the domeboro with water.

## 2021-06-26 ENCOUNTER — Ambulatory Visit: Payer: Medicare Other | Admitting: Cardiology

## 2021-06-26 DIAGNOSIS — I130 Hypertensive heart and chronic kidney disease with heart failure and stage 1 through stage 4 chronic kidney disease, or unspecified chronic kidney disease: Secondary | ICD-10-CM | POA: Diagnosis not present

## 2021-06-26 DIAGNOSIS — B965 Pseudomonas (aeruginosa) (mallei) (pseudomallei) as the cause of diseases classified elsewhere: Secondary | ICD-10-CM | POA: Diagnosis not present

## 2021-06-26 DIAGNOSIS — L97821 Non-pressure chronic ulcer of other part of left lower leg limited to breakdown of skin: Secondary | ICD-10-CM | POA: Diagnosis not present

## 2021-06-26 DIAGNOSIS — I739 Peripheral vascular disease, unspecified: Secondary | ICD-10-CM | POA: Diagnosis not present

## 2021-06-26 DIAGNOSIS — I872 Venous insufficiency (chronic) (peripheral): Secondary | ICD-10-CM | POA: Diagnosis not present

## 2021-06-26 DIAGNOSIS — I2510 Atherosclerotic heart disease of native coronary artery without angina pectoris: Secondary | ICD-10-CM | POA: Diagnosis not present

## 2021-06-27 ENCOUNTER — Telehealth: Payer: Self-pay | Admitting: Family Medicine

## 2021-06-27 DIAGNOSIS — I872 Venous insufficiency (chronic) (peripheral): Secondary | ICD-10-CM | POA: Diagnosis not present

## 2021-06-27 DIAGNOSIS — I739 Peripheral vascular disease, unspecified: Secondary | ICD-10-CM | POA: Diagnosis not present

## 2021-06-27 DIAGNOSIS — B965 Pseudomonas (aeruginosa) (mallei) (pseudomallei) as the cause of diseases classified elsewhere: Secondary | ICD-10-CM | POA: Diagnosis not present

## 2021-06-27 DIAGNOSIS — I130 Hypertensive heart and chronic kidney disease with heart failure and stage 1 through stage 4 chronic kidney disease, or unspecified chronic kidney disease: Secondary | ICD-10-CM | POA: Diagnosis not present

## 2021-06-27 DIAGNOSIS — L97821 Non-pressure chronic ulcer of other part of left lower leg limited to breakdown of skin: Secondary | ICD-10-CM | POA: Diagnosis not present

## 2021-06-27 DIAGNOSIS — I5032 Chronic diastolic (congestive) heart failure: Secondary | ICD-10-CM

## 2021-06-27 DIAGNOSIS — I2510 Atherosclerotic heart disease of native coronary artery without angina pectoris: Secondary | ICD-10-CM | POA: Diagnosis not present

## 2021-06-27 DIAGNOSIS — R0602 Shortness of breath: Secondary | ICD-10-CM

## 2021-06-27 NOTE — Telephone Encounter (Signed)
[name] with [facility] called in stating that today during her nurse visit patient's heart rate started off at 52 and after the visit it was 117. Her oxygen has been hanging between 86 and 89. Patient is okay while sitting but is having trouble breathing while moving. The appointment length was 1 hr. ? ?[name] could be contacted at [phone]. ? ?Please advise. ?

## 2021-06-27 NOTE — Telephone Encounter (Signed)
I called and spoke with Paris. ? ?Okay given for verbal orders & beside commode.  ? ?I advised Paris that we would order home oxygen, but if pt has any chest pain or shortness of breath (outside her normal) she needs to go to the ED. Oxygen will be delivered for pt.  ?

## 2021-06-27 NOTE — Telephone Encounter (Signed)
Paris OT with [facility] is calling and needs verbal orders for OT 1x1, 2x6 also bedside commode. Paris is calling to report pt oxygen was 88 and then pt walk to bathroom it dropped to 87. Please advise ?

## 2021-06-27 NOTE — Addendum Note (Signed)
Addended by: Rodrigo Ran on: 06/27/2021 03:34 PM ? ? Modules accepted: Orders ? ?

## 2021-06-27 NOTE — Telephone Encounter (Signed)
Can you please call pt and inquire about CP,SOB,palpitations,wheezing,and/or cough. ?If feeling SOB and O2 sats still < 90%, she needs to be evaluated in the ER. ?Thanks, ?BJ ?

## 2021-06-27 NOTE — Telephone Encounter (Signed)
I tried to call and speak with patient, but she did not answer and couldn't leave a message. ? ?I called and spoke with [facility]. Patient denied any chest pain, does have some shortness of breath with movement. Declines going to the ED, she is "fine" at rest. Per orders of pcp, home oxygen to be ordered at 2 Liters per minute to maintain O2 level of 89% or higher.  ?

## 2021-06-28 ENCOUNTER — Telehealth: Payer: Self-pay | Admitting: Family Medicine

## 2021-06-28 NOTE — Telephone Encounter (Signed)
Patient needs FL2 form completed and faxed to [facility] -- [phone] ? ?-[name] will need the last visit notes ? ?-please give social worker a call back ?

## 2021-06-29 ENCOUNTER — Inpatient Hospital Stay (HOSPITAL_COMMUNITY)
Admission: EM | Admit: 2021-06-29 | Discharge: 2021-07-03 | DRG: 603 | Disposition: A | Discharge status: Skilled Nursing Facility | Payer: Medicare Other | Attending: Internal Medicine | Admitting: Internal Medicine

## 2021-06-29 ENCOUNTER — Other Ambulatory Visit: Payer: Self-pay

## 2021-06-29 ENCOUNTER — Emergency Department (HOSPITAL_COMMUNITY): Payer: Medicare Other

## 2021-06-29 ENCOUNTER — Observation Stay (HOSPITAL_COMMUNITY): Payer: Medicare Other

## 2021-06-29 ENCOUNTER — Encounter (HOSPITAL_COMMUNITY): Payer: Self-pay

## 2021-06-29 DIAGNOSIS — B372 Candidiasis of skin and nail: Secondary | ICD-10-CM

## 2021-06-29 DIAGNOSIS — R54 Age-related physical debility: Secondary | ICD-10-CM | POA: Diagnosis present

## 2021-06-29 DIAGNOSIS — Z950 Presence of cardiac pacemaker: Secondary | ICD-10-CM

## 2021-06-29 DIAGNOSIS — Z79899 Other long term (current) drug therapy: Secondary | ICD-10-CM

## 2021-06-29 DIAGNOSIS — L03115 Cellulitis of right lower limb: Principal | ICD-10-CM | POA: Diagnosis present

## 2021-06-29 DIAGNOSIS — I5032 Chronic diastolic (congestive) heart failure: Secondary | ICD-10-CM | POA: Diagnosis not present

## 2021-06-29 DIAGNOSIS — Z20822 Contact with and (suspected) exposure to covid-19: Secondary | ICD-10-CM | POA: Diagnosis present

## 2021-06-29 DIAGNOSIS — I5030 Unspecified diastolic (congestive) heart failure: Secondary | ICD-10-CM | POA: Diagnosis present

## 2021-06-29 DIAGNOSIS — E669 Obesity, unspecified: Secondary | ICD-10-CM | POA: Diagnosis present

## 2021-06-29 DIAGNOSIS — L03119 Cellulitis of unspecified part of limb: Secondary | ICD-10-CM | POA: Diagnosis not present

## 2021-06-29 DIAGNOSIS — N1831 Chronic kidney disease, stage 3a: Secondary | ICD-10-CM | POA: Diagnosis present

## 2021-06-29 DIAGNOSIS — I83029 Varicose veins of left lower extremity with ulcer of unspecified site: Secondary | ICD-10-CM | POA: Diagnosis present

## 2021-06-29 DIAGNOSIS — R339 Retention of urine, unspecified: Secondary | ICD-10-CM | POA: Diagnosis not present

## 2021-06-29 DIAGNOSIS — Z91048 Other nonmedicinal substance allergy status: Secondary | ICD-10-CM

## 2021-06-29 DIAGNOSIS — E039 Hypothyroidism, unspecified: Secondary | ICD-10-CM | POA: Diagnosis present

## 2021-06-29 DIAGNOSIS — N179 Acute kidney failure, unspecified: Secondary | ICD-10-CM

## 2021-06-29 DIAGNOSIS — L03116 Cellulitis of left lower limb: Secondary | ICD-10-CM | POA: Diagnosis not present

## 2021-06-29 DIAGNOSIS — I130 Hypertensive heart and chronic kidney disease with heart failure and stage 1 through stage 4 chronic kidney disease, or unspecified chronic kidney disease: Secondary | ICD-10-CM | POA: Diagnosis present

## 2021-06-29 DIAGNOSIS — I951 Orthostatic hypotension: Secondary | ICD-10-CM | POA: Diagnosis present

## 2021-06-29 DIAGNOSIS — L0390 Cellulitis, unspecified: Secondary | ICD-10-CM | POA: Diagnosis present

## 2021-06-29 DIAGNOSIS — E860 Dehydration: Secondary | ICD-10-CM | POA: Diagnosis present

## 2021-06-29 DIAGNOSIS — Z7189 Other specified counseling: Secondary | ICD-10-CM | POA: Diagnosis not present

## 2021-06-29 DIAGNOSIS — I2510 Atherosclerotic heart disease of native coronary artery without angina pectoris: Secondary | ICD-10-CM | POA: Diagnosis present

## 2021-06-29 DIAGNOSIS — Z853 Personal history of malignant neoplasm of breast: Secondary | ICD-10-CM

## 2021-06-29 DIAGNOSIS — R338 Other retention of urine: Secondary | ICD-10-CM | POA: Diagnosis not present

## 2021-06-29 DIAGNOSIS — I517 Cardiomegaly: Secondary | ICD-10-CM | POA: Diagnosis not present

## 2021-06-29 DIAGNOSIS — E7800 Pure hypercholesterolemia, unspecified: Secondary | ICD-10-CM | POA: Diagnosis present

## 2021-06-29 DIAGNOSIS — Z6832 Body mass index (BMI) 32.0-32.9, adult: Secondary | ICD-10-CM

## 2021-06-29 DIAGNOSIS — Z66 Do not resuscitate: Secondary | ICD-10-CM | POA: Diagnosis not present

## 2021-06-29 DIAGNOSIS — Z8673 Personal history of transient ischemic attack (TIA), and cerebral infarction without residual deficits: Secondary | ICD-10-CM

## 2021-06-29 DIAGNOSIS — Z85528 Personal history of other malignant neoplasm of kidney: Secondary | ICD-10-CM

## 2021-06-29 DIAGNOSIS — Z79890 Hormone replacement therapy (postmenopausal): Secondary | ICD-10-CM

## 2021-06-29 DIAGNOSIS — Z881 Allergy status to other antibiotic agents status: Secondary | ICD-10-CM

## 2021-06-29 DIAGNOSIS — L97529 Non-pressure chronic ulcer of other part of left foot with unspecified severity: Secondary | ICD-10-CM | POA: Diagnosis not present

## 2021-06-29 DIAGNOSIS — M19072 Primary osteoarthritis, left ankle and foot: Secondary | ICD-10-CM | POA: Diagnosis not present

## 2021-06-29 DIAGNOSIS — Z79891 Long term (current) use of opiate analgesic: Secondary | ICD-10-CM

## 2021-06-29 DIAGNOSIS — I872 Venous insufficiency (chronic) (peripheral): Secondary | ICD-10-CM | POA: Diagnosis present

## 2021-06-29 DIAGNOSIS — M7731 Calcaneal spur, right foot: Secondary | ICD-10-CM | POA: Diagnosis not present

## 2021-06-29 DIAGNOSIS — R0902 Hypoxemia: Secondary | ICD-10-CM | POA: Diagnosis not present

## 2021-06-29 DIAGNOSIS — I442 Atrioventricular block, complete: Secondary | ICD-10-CM | POA: Diagnosis present

## 2021-06-29 DIAGNOSIS — Z515 Encounter for palliative care: Secondary | ICD-10-CM | POA: Diagnosis not present

## 2021-06-29 DIAGNOSIS — I639 Cerebral infarction, unspecified: Secondary | ICD-10-CM | POA: Diagnosis present

## 2021-06-29 DIAGNOSIS — Z888 Allergy status to other drugs, medicaments and biological substances status: Secondary | ICD-10-CM

## 2021-06-29 DIAGNOSIS — Z8249 Family history of ischemic heart disease and other diseases of the circulatory system: Secondary | ICD-10-CM

## 2021-06-29 DIAGNOSIS — H409 Unspecified glaucoma: Secondary | ICD-10-CM | POA: Diagnosis present

## 2021-06-29 DIAGNOSIS — I959 Hypotension, unspecified: Secondary | ICD-10-CM | POA: Diagnosis not present

## 2021-06-29 DIAGNOSIS — F419 Anxiety disorder, unspecified: Secondary | ICD-10-CM | POA: Diagnosis present

## 2021-06-29 DIAGNOSIS — S81802A Unspecified open wound, left lower leg, initial encounter: Secondary | ICD-10-CM

## 2021-06-29 DIAGNOSIS — I4901 Ventricular fibrillation: Secondary | ICD-10-CM | POA: Diagnosis not present

## 2021-06-29 DIAGNOSIS — Z885 Allergy status to narcotic agent status: Secondary | ICD-10-CM

## 2021-06-29 DIAGNOSIS — L97929 Non-pressure chronic ulcer of unspecified part of left lower leg with unspecified severity: Secondary | ICD-10-CM | POA: Diagnosis present

## 2021-06-29 DIAGNOSIS — Z7901 Long term (current) use of anticoagulants: Secondary | ICD-10-CM

## 2021-06-29 DIAGNOSIS — Z905 Acquired absence of kidney: Secondary | ICD-10-CM

## 2021-06-29 DIAGNOSIS — L97509 Non-pressure chronic ulcer of other part of unspecified foot with unspecified severity: Secondary | ICD-10-CM

## 2021-06-29 DIAGNOSIS — L97519 Non-pressure chronic ulcer of other part of right foot with unspecified severity: Secondary | ICD-10-CM | POA: Diagnosis not present

## 2021-06-29 DIAGNOSIS — Z90710 Acquired absence of both cervix and uterus: Secondary | ICD-10-CM

## 2021-06-29 DIAGNOSIS — M19071 Primary osteoarthritis, right ankle and foot: Secondary | ICD-10-CM | POA: Diagnosis not present

## 2021-06-29 DIAGNOSIS — M7732 Calcaneal spur, left foot: Secondary | ICD-10-CM | POA: Diagnosis not present

## 2021-06-29 DIAGNOSIS — Z7401 Bed confinement status: Secondary | ICD-10-CM | POA: Diagnosis not present

## 2021-06-29 DIAGNOSIS — Z9114 Patient's other noncompliance with medication regimen: Secondary | ICD-10-CM

## 2021-06-29 LAB — CBC WITH DIFFERENTIAL/PLATELET
Abs Immature Granulocytes: 0.03 10*3/uL (ref 0.00–0.07)
Basophils Absolute: 0 10*3/uL (ref 0.0–0.1)
Basophils Relative: 0 %
Eosinophils Absolute: 0 10*3/uL (ref 0.0–0.5)
Eosinophils Relative: 0 %
HCT: 41.3 % (ref 36.0–46.0)
Hemoglobin: 13.3 g/dL (ref 12.0–15.0)
Immature Granulocytes: 0 %
Lymphocytes Relative: 7 %
Lymphs Abs: 0.6 10*3/uL — ABNORMAL LOW (ref 0.7–4.0)
MCH: 30.6 pg (ref 26.0–34.0)
MCHC: 32.2 g/dL (ref 30.0–36.0)
MCV: 95.2 fL (ref 80.0–100.0)
Monocytes Absolute: 1 10*3/uL (ref 0.1–1.0)
Monocytes Relative: 12 %
Neutro Abs: 7.2 10*3/uL (ref 1.7–7.7)
Neutrophils Relative %: 81 %
Platelets: 337 10*3/uL (ref 150–400)
RBC: 4.34 MIL/uL (ref 3.87–5.11)
RDW: 13.7 % (ref 11.5–15.5)
WBC: 8.9 10*3/uL (ref 4.0–10.5)
nRBC: 0 % (ref 0.0–0.2)

## 2021-06-29 LAB — COMPREHENSIVE METABOLIC PANEL
ALT: 22 U/L (ref 0–44)
AST: 26 U/L (ref 15–41)
Albumin: 2.3 g/dL — ABNORMAL LOW (ref 3.5–5.0)
Alkaline Phosphatase: 131 U/L — ABNORMAL HIGH (ref 38–126)
Anion gap: 11 (ref 5–15)
BUN: 32 mg/dL — ABNORMAL HIGH (ref 8–23)
CO2: 22 mmol/L (ref 22–32)
Calcium: 8 mg/dL — ABNORMAL LOW (ref 8.9–10.3)
Chloride: 101 mmol/L (ref 98–111)
Creatinine, Ser: 1.78 mg/dL — ABNORMAL HIGH (ref 0.44–1.00)
GFR, Estimated: 27 mL/min — ABNORMAL LOW (ref >60)
Glucose, Bld: 95 mg/dL (ref 70–99)
Potassium: 4.4 mmol/L (ref 3.5–5.1)
Sodium: 134 mmol/L — ABNORMAL LOW (ref 135–145)
Total Bilirubin: 0.5 mg/dL (ref 0.3–1.2)
Total Protein: 5.7 g/dL — ABNORMAL LOW (ref 6.5–8.1)

## 2021-06-29 LAB — RESP PANEL BY RT-PCR (FLU A&B, COVID) ARPGX2
Influenza A by PCR: NEGATIVE
Influenza B by PCR: NEGATIVE
SARS Coronavirus 2 by RT PCR: NEGATIVE

## 2021-06-29 LAB — LACTIC ACID, PLASMA: Lactic Acid, Venous: 1.8 mmol/L (ref 0.5–1.9)

## 2021-06-29 LAB — APTT: aPTT: 35 seconds (ref 24–36)

## 2021-06-29 LAB — PROTIME-INR
INR: 1.1 (ref 0.8–1.2)
Prothrombin Time: 13.7 seconds (ref 11.4–15.2)

## 2021-06-29 MED ORDER — LACTATED RINGERS IV BOLUS (SEPSIS): 500.0000 mL | Freq: Once | INTRAVENOUS | Status: DC

## 2021-06-29 MED ORDER — LATANOPROST 0.005 % OP SOLN
1.0000 [drp] | Freq: Every day | OPHTHALMIC | Status: DC
  Administered 2021-06-29 – 2021-07-01 (×3): 1 [drp] via OPHTHALMIC
  Filled 2021-06-29 (×2): qty 2.5

## 2021-06-29 MED ORDER — MIDODRINE HCL 5 MG PO TABS
2.5000 mg | ORAL_TABLET | Freq: Every day | ORAL | Status: DC
  Administered 2021-06-29 – 2021-06-30 (×2): 2.5 mg via ORAL
  Filled 2021-06-29 (×2): qty 1

## 2021-06-29 MED ORDER — LEVOTHYROXINE SODIUM 50 MCG PO TABS
50.0000 ug | ORAL_TABLET | Freq: Every day | ORAL | Status: DC
  Administered 2021-06-30 – 2021-07-03 (×4): 50 ug via ORAL
  Filled 2021-06-29 (×4): qty 1

## 2021-06-29 MED ORDER — VANCOMYCIN HCL IN DEXTROSE 1-5 GM/200ML-% IV SOLN: 1000.0000 mg | Freq: Once | INTRAVENOUS | Status: DC

## 2021-06-29 MED ORDER — NYSTATIN 100000 UNIT/GM EX POWD
Freq: Two times a day (BID) | CUTANEOUS | Status: DC
  Filled 2021-06-29 (×2): qty 15

## 2021-06-29 MED ORDER — SODIUM CHLORIDE 0.9 % IV SOLN
2.0000 g | Freq: Once | INTRAVENOUS | Status: AC
  Administered 2021-06-29: 2 g via INTRAVENOUS
  Filled 2021-06-29: qty 2

## 2021-06-29 MED ORDER — SODIUM CHLORIDE 0.9 % IV SOLN
2.0000 g | INTRAVENOUS | Status: DC
  Administered 2021-06-30: 2 g via INTRAVENOUS
  Filled 2021-06-29: qty 2

## 2021-06-29 MED ORDER — PRAVASTATIN SODIUM 10 MG PO TABS: 20.0000 mg | ORAL_TABLET | Freq: Every day | ORAL | Status: DC

## 2021-06-29 MED ORDER — CLOTRIMAZOLE 1 % EX CREA
TOPICAL_CREAM | Freq: Two times a day (BID) | CUTANEOUS | Status: DC
  Filled 2021-06-29 (×2): qty 15

## 2021-06-29 MED ORDER — ACETAMINOPHEN 325 MG PO TABS
650.0000 mg | ORAL_TABLET | Freq: Four times a day (QID) | ORAL | Status: DC | PRN
  Administered 2021-07-03: 650 mg via ORAL
  Filled 2021-06-29 (×2): qty 2

## 2021-06-29 MED ORDER — APIXABAN 2.5 MG PO TABS
2.5000 mg | ORAL_TABLET | Freq: Two times a day (BID) | ORAL | Status: DC
  Administered 2021-06-29 – 2021-07-02 (×6): 2.5 mg via ORAL
  Filled 2021-06-29 (×7): qty 1

## 2021-06-29 MED ORDER — VANCOMYCIN VARIABLE DOSE PER UNSTABLE RENAL FUNCTION (PHARMACIST DOSING): Status: DC

## 2021-06-29 MED ORDER — SODIUM CHLORIDE 0.9 % IV SOLN: INTRAVENOUS | Status: AC

## 2021-06-29 MED ORDER — VANCOMYCIN HCL 1250 MG/250ML IV SOLN
1250.0000 mg | Freq: Once | INTRAVENOUS | Status: AC
  Administered 2021-06-29: 1250 mg via INTRAVENOUS
  Filled 2021-06-29: qty 250

## 2021-06-29 MED ORDER — ACETAMINOPHEN 650 MG RE SUPP: 650.0000 mg | Freq: Four times a day (QID) | RECTAL | Status: DC | PRN

## 2021-06-29 NOTE — Assessment & Plan Note (Addendum)
Continue levothyroxine.  TSH is 3.5 ?

## 2021-06-29 NOTE — Assessment & Plan Note (Addendum)
History of CVA in 2020 and 2021.  Admitted for suspected stroke in January 2023.  She was started on Eliquis which is being continued.  Does not take statin due to diarrhea. ?

## 2021-06-29 NOTE — Progress Notes (Signed)
Pharmacy Antibiotic Note ? ?[name] is a 88 y.o. female admitted on 06/29/2021 with a wound infection.  Pharmacy has been consulted for vancomycin and cefepime dosing. Patient presented with malaise, weakness, and difficulty ambulating.Patient noted to have bilateral leg swelling with suspected wound infection. Patient is currently afebrile. WBC is WNL. Creatinine is 1.78. Baseline Cr 2 months prior was 1.15. CrCl is 24.3 mL/min today.  ? ?Plan: ?-Give vancomycin 1250 mg once. Vancomycin dosing per levels while renal function is poor. Would recommend obtaining a level 3/5 as vancomycin 1250 mg every 48 hours would provide an eAUC of 440 using Scr 1.78 and 94.8 kg ?-Start cefepime 2 g every 24 hours ?-Follow clinical progress and plans for deescalation, levels as indicated ? ?Temp (24hrs), Avg:97.9 ?F (36.6 ?C), Min:97.9 ?F (36.6 ?C), Max:97.9 ?F (36.6 ?C) ? ?No results for input(s): WBC, CREATININE, LATICACIDVEN, VANCOTROUGH, VANCOPEAK, VANCORANDOM, GENTTROUGH, GENTPEAK, GENTRANDOM, TOBRATROUGH, TOBRAPEAK, TOBRARND, AMIKACINPEAK, AMIKACINTROU, AMIKACIN in the last 168 hours.  ?CrCl cannot be calculated (Patient's most recent lab result is older than the maximum 21 days allowed.).   ? ?Allergies  ?Allergen Reactions  ? Amoxicillin-Pot Clavulanate Diarrhea and Nausea And Vomiting  ?  Has patient had a PCN reaction causing immediate rash, facial/tongue/throat swelling, SOB or lightheadedness with hypotension: Yes ?Has patient had a PCN reaction causing severe rash involving mucus membranes or skin necrosis: No ?Has patient had a PCN reaction that required hospitalization: No ?Has patient had a PCN reaction occurring within the last 10 years: Yes ?If all of the above answers are "NO", then may proceed with Cephalosporin use. ?  ? Erythromycin Hives  ? Tape Other (See Comments)  ?  BAND AIDS-SKIN IRRITATION  ? Codeine Nausea And Vomiting  ? Doxycycline Diarrhea  ? Flagyl [Metronidazole] Hives  ? Macrobid  [Nitrofurantoin Macrocrystal] Nausea And Vomiting  ? Tolterodine Nausea And Vomiting  ? Ciprofloxacin Hives and Rash  ? ? ?Antimicrobials this admission: ?Vancomycin 3/3 >>  ?Cefepime 3/3 >>  ? ?Dose adjustments this admission: ?none ? ?Microbiology results: ?3/3 BCx: IP ?3/3 UCx: IP  ? ? ?Thank you for allowing pharmacy to participate in this patient's care. ? ?Reatha Harps, PharmD ?PGY1 Pharmacy Resident ?06/29/2021 4:25 PM ?Check [email or web address] for unit specific pharmacy number ? ? ?

## 2021-06-29 NOTE — Assessment & Plan Note (Addendum)
Started on nystatin powder  ?

## 2021-06-29 NOTE — Assessment & Plan Note (Addendum)
See above under heart failure. ?Continue midodrine. ?

## 2021-06-29 NOTE — Assessment & Plan Note (Addendum)
Baseline creatinine of 1.1-1.2.  ?Has a history of renal cell carcinoma with right nephrectomy. ?Apparently had not been eating or drinking anything since symptom onset.  Noted to be quite dehydrated at admission.  Creatinine was 1.78.  Was given IV fluids.   ?She also experienced urinary retention.  Renal function gradually improving.  Avoid nephrotoxic agents. ?

## 2021-06-29 NOTE — Assessment & Plan Note (Addendum)
Chronic hypotension ? ?Echocardiogram from January showed EF of 55 to 60%.  Lasix is on hold.    ?Cortisol level was normal.  She is on no blood pressure lowering agents.  No evidence for overwhelming sepsis or any blood loss or hypovolemia. ?Continue midodrine at current dose.  Blood pressure low but stable.  Asymptomatic. ?

## 2021-06-29 NOTE — ED Triage Notes (Signed)
Pt BIB [facility] from home d/t malaise that has been going on for the past few days. Per EMS, pt has been experiencing generalized weakness, has hard time getting up ambulating around. Pt's bilateral  legs look  swollen. A& O X4.  ?

## 2021-06-29 NOTE — ED Provider Notes (Signed)
[facility] EMERGENCY DEPARTMENT Provider Note   CSN: [id number] Arrival date & time: 06/29/21  1229     History  Chief Complaint  Patient presents with   Fatigue    [name] is a 88 y.o. female with a past medical history of venous insufficiency, chronic kidney disease stage III, CVA 2020, 2021, and 04/2021, HF pEF and a flutter on Eliquis presenting today due to the complaint of fatigue.  She reports that in November she broke her arm and has has gone downhill ever since.  After she broke her arm she was placed in [facility] facility.  In January, patient was admitted for a small left MCA infarct.  She returned to [facility] briefly however last week she checked herself out because she felt they were not taking good care of her.  She continues to take an antibiotic that was prescribed to her for leg infection, reports having 1 day left.  Is unsure the name of the medication.  She has felt fatigued, had difficulty walking and overall weakness. She saw her primary care provider on 2/17 and given [facility] for her foot ulcer.  Denies any pain to her bilateral lower extremities.  She believes she has been taking her medication as prescribed however she has a friend who lives in the [place] really unsure which medications she takes when.  Home Medications Prior to Admission medications   Medication Sig Start Date End Date Taking? Authorizing Provider  acetaminophen (TYLENOL) 500 MG tablet Take 500 mg by mouth in the morning and at bedtime.    [provider]  aluminum sulfate-calcium acetate (DOMEBORO) packet Apply 1 packet topically daily as needed (Mixed with water,lukewarm and apply compressions on affected area.). 06/15/21   Martinique, [name], MD  Amino Acids-Protein Hydrolys (PRO-STAT PO) Take 30 mLs by mouth in the morning and at bedtime.    [provider]  apixaban (ELIQUIS) 5 MG TABS tablet Take 1 tablet (5 mg total) by mouth 2 (two) times daily.  05/05/21   Rosalin Hawking, MD  Ascorbic Acid (VITAMIN C PO) Take 1 tablet by mouth daily.    [provider]  busPIRone (BUSPAR) 5 MG tablet Take 2.5 mg by mouth 3 (three) times daily.    [provider]  furosemide (LASIX) 20 MG tablet TAKE 1/2 TABLET DAILY Patient taking differently: Take 10 mg by mouth daily. 09/18/20   Martinique, [name], MD  gentamicin cream (GARAMYCIN) 0.1 % Apply 1 application topically daily.    [provider]  latanoprost (XALATAN) 0.005 % ophthalmic solution Place 1 drop into both eyes at bedtime. 01/10/20   Martinique, [name], MD  midodrine (PROAMATINE) 2.5 MG tablet Take 1 tablet (2.5 mg total) by mouth daily. TAKE 1 TABLET DAILY AFTER  BREAKFAST 06/18/21   Martinique, [name], MD  pravastatin (PRAVACHOL) 20 MG tablet Take 1 tablet (20 mg total) by mouth daily at 6 PM. 05/05/21   Rinehuls, Early Chars, PA-C  SYNTHROID 50 MCG tablet TAKE 1 TABLET DAILY BEFORE BREAKFAST Patient taking differently: Take 50 mcg by mouth daily before breakfast. 03/28/21   Martinique, [name], MD  traMADol (ULTRAM) 50 MG tablet Take 1 tablet (50 mg total) by mouth every 8 (eight) hours as needed for up to 10 doses for severe pain. 03/08/21   Curatolo, Adam, DO      Allergies    Amoxicillin-pot clavulanate, Erythromycin, Tape, Codeine, Doxycycline, Flagyl [metronidazole], Macrobid [nitrofurantoin macrocrystal], Tolterodine, and Ciprofloxacin  Review of Systems   Review of Systems  Constitutional:  Positive for chills and fatigue.  Cardiovascular:  Positive for leg swelling.  Musculoskeletal:  Positive for gait problem.  Skin:  Positive for color change and wound.  Neurological:  Positive for weakness.  See HPI  Physical Exam Updated Vital Signs BP (!) 138/124    Pulse 66    SpO2 99%  Physical Exam Vitals and nursing note reviewed.  Constitutional:      Appearance: Normal appearance.  HENT:     Head: Normocephalic and atraumatic.  Eyes:     General: No scleral icterus.     Conjunctiva/sclera: Conjunctivae normal.  Pulmonary:     Effort: Pulmonary effort is normal. No respiratory distress.  Musculoskeletal:     Right lower leg: Edema present.     Left lower leg: Edema present.     Comments: Full range of motion of bilateral lower extremities.  Palpable DP pulses bilaterally.  Photos of the extremities are in the patient's chart.  Cellulitic with large amounts of purulent drainage.  Nontender.  Patient with decreased sensation along all metatarsals on the plantar side of her right foot.  Sensation intact on her left foot.  Sensation intact on bilateral heels.  Please see photos in chart.  Skin:    Findings: No rash.  Neurological:     Mental Status: She is alert.  Psychiatric:        Mood and Affect: Mood normal.    ED Results / Procedures / Treatments   Labs (all labs ordered are listed, but only abnormal results are displayed) Labs Reviewed  CULTURE, BLOOD (ROUTINE X 2)  CULTURE, BLOOD (ROUTINE X 2)  URINE CULTURE  RESP PANEL BY RT-PCR (FLU A&B, COVID) ARPGX2  AEROBIC CULTURE W GRAM STAIN (SUPERFICIAL SPECIMEN)  LACTIC ACID, PLASMA  LACTIC ACID, PLASMA  COMPREHENSIVE METABOLIC PANEL  CBC WITH DIFFERENTIAL/PLATELET  PROTIME-INR  APTT  URINALYSIS, ROUTINE W REFLEX MICROSCOPIC    EKG None  Radiology No results found.  Procedures Procedures  Patient atrial fibrillation.  Rate of 62.  Medications Ordered in ED Medications  vancomycin (VANCOCIN) IVPB 1000 mg/200 mL premix (has no administration in time range)    ED Course/ Medical Decision Making/ A&P                           Medical Decision Making Amount and/or Complexity of Data Reviewed Labs: ordered. Radiology: ordered. ECG/medicine tests: ordered.  Risk Prescription drug management.   88 year old female presenting today due to lower extremity weakness and fatigue.  She reports that she recently left her SNF and moved home and is been having an increasing amount of trouble  ambulating and has noticed some swelling to her legs.  Differential includes but is not limited to heart failure exacerbation, worsening kidney function, COPD exacerbation, angina, venous insufficiency and DVT/PE.   Per chart review, during her hospitalization for stroke work-up in January, patient was noted to have oozing from her lower extremities.  This was cultured and grew out Pseudomonas.  Hospitalist team decided not to treat at that time.  She reports that she was later started on antibiotics by [facility] living facility however she is unable to state which antibiotics she is on however family friend arrived at the bedside and said patient was on clindamycin.  Finished the course within the last few days.  I spoke with patient's healthcare power of attorney as well as her family friends  who state that she wanted to leave [facility] however they knew that she would not be successful at home.  They believe she will need placement and encouragement to return to a rehab facility.  They do report that she has been given all of her medications regularly.  They noticed a decline in activity however no other concerns at this time.  Work-up: Lab work was ordered and viewed by me.  Pertinent results include normal white count creatinine of 1.78 up from 1.15 a month ago Normal lab tech.  Imaging: Patient's chest x-ray ordered, and viewed by me.  No signs of pneumonia, pulmonary effusion, pulmonary edema.  Radiologist read is negative.  Consults: I consulted pharmacy for proper dosing of vancomycin due to patient's kidney disease.  They also suggested cefepime.  Both of these antibiotics were ordered.   Treatment: Antibiotics  Disposition: Due to patient's failed outpatient clindamycin and her AKI, she will be admitted to the hospital.  Dr. Rogers Blocker is the accepting physician.  She will likely require PT/OT and placement during her hospitalization.  Final Clinical Impression(s) / ED Diagnoses Final diagnoses:   Cellulitis of left lower extremity  Cellulitis of right lower extremity  AKI (acute kidney injury) ([place])    Rx / DC Orders Admit to Dr. Dortha Schwalbe, Calista Crain A, PA-C 06/29/21 1629    Truddie Hidden, MD 07/01/21 [phone]

## 2021-06-29 NOTE — Assessment & Plan Note (Addendum)
Status post pacemaker in 2009.  Revision of pacemaker in 2016.   ?

## 2021-06-29 NOTE — H&P (Signed)
History and Physical    Patient: [name] DOB: June 21, 1927 DOA: 06/29/2021 DOS: the patient was seen and examined on 06/29/2021 PCP: [name], Nichole G, MD  Patient coming from: Home - lives alone, uses walker    Chief Complaint: weakness/worsening   HPI: [name] is a 88 y.o. female with medical history significant of CHF, CHB s/p PPM, HLD, invasive ductal carcinoma of the breast, thyroid disease, venous insufficiency with venous ulcer, CKD stage III, hx of CVA in 2020/2021 who presented to ED with bilateral leg weakness. She was unable to get out of her chair this morning since 6am. She tried and trid to get up out of chair and she just couldn't do it.  A friend from church came over at 10:30 and Ems was called. She states she has been weak in her legs for 2-3 weeks, but has been feeling bad for 2 days. She also has had weeping in her legs for 5-6 weeks. She states the redness in her legs has gotten much worse. Also has increased pain and pain with standing as skin breakdown on bottom of feet.   Saw her PCP for her legs on 2/17 and given domeboro. Has also been on antibiotic for her legs given by [facility] and has one more day.   She is currently living on her own, but was previously in [facility] after breaking her arm last year. In January she was admitted to the hospital for an acute CVA in left MCA. She returned to [facility]; however, she checked herself out of [facility] on 06/11/21 and returned home because she doesn't like living there. She has had a fall living alone on 2/17.  Talked to her POA: [name] who states they didn't think it was a good idea for her to return home, but she can make decisions and wanted to go home. While at [facility] they were taking good care of her legs, but when she got home she had home health TID and apparently has not had any dressing change since 2/17. Has also not been taking her medication since being home. Only takes her lasix, synthroid and probiotic.  Off her eliquis.   She has had no fever/chills, no headache/dizziness, no chest pain or palpitations, no shortness of breath or cough, no abdominal pain, has chronic diarrhea, no N/V, no dysuria. +leg swelling.    ER Course:  vitals:   afebrile, bp: 138/124, HR: 49, RR: 13, oxygen: 93%RA Pertinent labs: BUN: 32, creatinine: 1.78 (1.1-1.2),  CXR: no acute finding In Ed: started on vanc/cefepime for wound infection     Review of Systems: As mentioned in the history of present illness. All other systems reviewed and are negative. Past Medical History:  Diagnosis Date   Cataract    Chronic congestive heart failure, unspecified heart failure type ([place]) 09/19/2019   Complete heart block (HCC)    s/p PPM implant (MDT) by [name].  Atrial lead could not be paced at time of the procedure.  She has chronic AV dysociation   Delusional disorder ([place])    Exogenous obesity    Hypercholesterolemia    Invasive ductal carcinoma of breast ([place]) 03/2007   BILATERAL BREASTS   Orthostatic hypotension    treated with midodrine by [name]   PONV (postoperative nausea and vomiting)    Thyroid disease    Venous insufficiency    Past Surgical History:  Procedure Laterality Date   ABDOMINAL HYSTERECTOMY     BREAST LUMPECTOMY Bilateral 2009  CATARACT EXTRACTION     EYE SURGERY X 2   CHOLECYSTECTOMY     COLONOSCOPY N/A 02/16/2015   Procedure: COLONOSCOPY;  Surgeon: Nichole Corner, MD;  Location: [place];  Service: Endoscopy;  Laterality: N/A;   EP IMPLANTABLE DEVICE N/A 02/23/2015   pacemaker generator change (MDT Sensia SR) by [name]    ESOPHAGOGASTRODUODENOSCOPY N/A 02/16/2015   Procedure: ESOPHAGOGASTRODUODENOSCOPY (EGD);  Surgeon: Nichole Corner, MD;  Location: [facility] ENDOSCOPY;  Service: Endoscopy;  Laterality: N/A;   IR CT HEAD LTD  09/21/2019   IR PERCUTANEOUS ART THROMBECTOMY/INFUSION INTRACRANIAL INC DIAG ANGIO  09/21/2019   PACEMAKER INSERTION     MDT implanted by [name].   Atrial lead placement was unsuccessful.  She has chronic AV dysociation   RADIOLOGY WITH ANESTHESIA N/A 09/21/2019   Procedure: IR WITH ANESTHESIA CODE STROKE;  Surgeon: Radiologist, Medication, MD;  Location: [place];  Service: Radiology;  Laterality: N/A;   remote right radical nephrectomy  2009   Social History:  reports that she has never smoked. She has never used smokeless tobacco. She reports that she does not drink alcohol and does not use drugs.  Allergies  Allergen Reactions   Amoxicillin-Pot Clavulanate Diarrhea and Nausea And Vomiting    Has patient had a PCN reaction causing immediate rash, facial/tongue/throat swelling, SOB or lightheadedness with hypotension: Yes Has patient had a PCN reaction causing severe rash involving mucus membranes or skin necrosis: No Has patient had a PCN reaction that required hospitalization: No Has patient had a PCN reaction occurring within the last 10 years: Yes If all of the above answers are "NO", then may proceed with Cephalosporin use.    Erythromycin Hives   Tape Other (See Comments)    BAND AIDS-SKIN IRRITATION   Codeine Nausea And Vomiting   Doxycycline Diarrhea   Flagyl [Metronidazole] Hives   Macrobid [Nitrofurantoin Macrocrystal] Nausea And Vomiting   Protonix [Pantoprazole]     Other reaction(s): diarrhea   Tolterodine Nausea And Vomiting   Ciprofloxacin Hives and Rash    Family History  Problem Relation Age of Onset   Heart disease Maternal Grandmother    Heart disease Mother     Prior to Admission medications   Medication Sig Start Date End Date Taking? Authorizing Provider  acetaminophen (TYLENOL) 500 MG tablet Take 500 mg by mouth in the morning and at bedtime.    [provider]  aluminum sulfate-calcium acetate (DOMEBORO) packet Apply 1 packet topically daily as needed (Mixed with water,lukewarm and apply compressions on affected area.). 06/15/21   [name], Nichole G, MD  Amino Acids-Protein Hydrolys (PRO-STAT  PO) Take 30 mLs by mouth in the morning and at bedtime.    [provider]  apixaban (ELIQUIS) 5 MG TABS tablet Take 1 tablet (5 mg total) by mouth 2 (two) times daily. 05/05/21   Rosalin Hawking, MD  Ascorbic Acid (VITAMIN C PO) Take 1 tablet by mouth daily.    [provider]  busPIRone (BUSPAR) 5 MG tablet Take 2.5 mg by mouth 3 (three) times daily.    [provider]  furosemide (LASIX) 20 MG tablet TAKE 1/2 TABLET DAILY Patient taking differently: Take 10 mg by mouth daily. 09/18/20   [name], Nichole G, MD  gentamicin cream (GARAMYCIN) 0.1 % Apply 1 application topically daily.    [provider]  latanoprost (XALATAN) 0.005 % ophthalmic solution Place 1 drop into both eyes at bedtime. 01/10/20   [name], Nichole G, MD  midodrine (PROAMATINE) 2.5 MG tablet  Take 1 tablet (2.5 mg total) by mouth daily. TAKE 1 TABLET DAILY AFTER  BREAKFAST 06/18/21   [name], Nichole G, MD  pravastatin (PRAVACHOL) 20 MG tablet Take 1 tablet (20 mg total) by mouth daily at 6 PM. 05/05/21   Rinehuls, Early Chars, PA-C  SYNTHROID 50 MCG tablet TAKE 1 TABLET DAILY BEFORE BREAKFAST Patient taking differently: Take 50 mcg by mouth daily before breakfast. 03/28/21   [name], Nichole G, MD  traMADol (ULTRAM) 50 MG tablet Take 1 tablet (50 mg total) by mouth every 8 (eight) hours as needed for up to 10 doses for severe pain. 03/08/21   Lennice Sites, DO    Physical Exam: Vitals:   06/29/21 1617 06/29/21 1618 06/29/21 1730 06/29/21 1800  BP:    99/65  Pulse:   60 61  Resp:   18 18  Temp:      TempSrc:      SpO2:   95% 94%  Weight: 94.8 kg     Height:  5\' 7"  (1.702 m)     General:  Appears calm and comfortable and is in NAD Eyes:  PERRL, EOMI, normal lids, iris ENT:  hard of hearing, lips & tongue, mmm; appropriate dentition Neck:  no LAD, masses or thyromegaly; no carotid bruits Cardiovascular:  RRR, no m/r/[name].  Respiratory:   CTA bilaterally with no wheezes/rales/rhonchi.  Normal respiratory  effort. Abdomen:  soft, NT, ND, NABS Back:   normal alignment, no CVAT Skin:  intertrigenous candida in pannus/groin. Bilateral LE with erythema, edema, weeping with purulent drainage and foul smell.  Musculoskeletal:  grossly weak in bilateral LE, decreased ROM, no bony abnormality. UE 4/5 bilaterally  Lower extremity:  see skin exam above.   2+ distal pulses.   Psychiatric:  grossly normal mood and affect, speech fluent and appropriate, AOx3 Neurologic:  CN 2-12 grossly intact, moves all extremities in coordinated fashion, sensation intact   Radiological Exams on Admission: Independently reviewed - see discussion in A/P where applicable  DG Chest Port 1 View  Result Date: 06/29/2021 CLINICAL DATA:  Provided history: Questionable sepsis. Evaluate for abnormality. Bilateral lower extremity swelling/redness. EXAM: PORTABLE CHEST 1 VIEW COMPARISON:  Prior chest radiographs 09/21/2019 and earlier. FINDINGS: Cardiomegaly. Aortic atherosclerosis. Redemonstrated foci of scarring within the left lung base. No appreciable airspace consolidation or pulmonary edema. No evidence of pleural effusion or pneumothorax. No acute bony abnormality identified. Left chest single lead AICD. IMPRESSION: No evidence of acute cardiopulmonary abnormality. Redemonstrated foci of scarring within the left lung base. Cardiomegaly. Aortic Atherosclerosis (ICD10-I70.0). Electronically Signed   By: Kellie Simmering D.O.   On: 06/29/2021 13:30    EKG: Independently reviewed.  V-paced, 137; nonspecific ST changes with no evidence of acute ischemia   Labs on Admission: I have personally reviewed the available labs and imaging studies at the time of the admission.  Pertinent labs:   BUN: 32,  creatinine: 1.78 (1.1-1.2),    Assessment and Plan: * Cellulitis in setting of chronic venous stasis  88 y.o presenting with bilateral weakness and worsening erythema, drainage and pain in legs consistent with cellulitis in chronic  venous stasis.  -observation to medical telemetry -continue vanc+cefepime. Foul smelling drainage -xrays of bilateral feet. Left heel wound and scabbed over,appears more healed wound on right toe.  -BC pending -lactic acid wnl initially  -wound care consult -ABIs ordered  -PT ordered  -had conversation with her [place], [name], who also is in agreement she can not return home. SW has been consulted. [name]  has already started work on placement and will work with SW.   Acute renal failure superimposed on stage 3a chronic kidney disease ([place]) Baseline creatinine of 1.1-1.2. hx of RCC with right nephrectomy  She has not eaten or drank anything since she couldn't get out of chair and I wonder how much she has taken in with her living alone.  Gentle IVF x 10 hours as she is dry on exam Strict I/O Hold nephrotoxic drugs Renally dose eliquis  Check up/uc Check UA    Candidal intertrigo Concern with home hygiene living alone Start clotrimazole BID and nystatin powder BID   (HFpEF) heart failure with preserved ejection fraction (HCC) Appears dry on exam Echo 04/2021: EF of 55-60% with normal LVF Strict I/O Hold lasix with AKI  Gentle, time limited IVF   Orthostatic hypotension Stable, continue midodrine  After talking with [name], she has not been taking her  Medication since being home and has refused pills. Will restart this here, but goals of care discussion needs to be had.   Hypothyroidism No TSH since 08/2019, repeat here Continue home synthroid   Counseling regarding goals of care Patient has not been taking medication and has had decline since moving home. Discussed with her [place], [name], that palliative care consult may something we need to do while here in hospital.   Stroke aborted by administration of thrombolytic agent ([place]) - R MCA s/p tPA and mechanical thrombectomy, unk embolic source History of CVA in 2020 and 2021  Non compliant with medication Starting her eliquis. Renally  dosed. Does not take her statin due to diarrhea.   Complete heart block s/p PPM  She has a history of complete heart block with 12 second pause and underwent permanent pacemaker implant on 10/09/2007 with a Medtronic adapta VVI pacemaker.  revision of her pacemaker on February 23, 2015.     Advance Care Planning:   Code Status: Full Code   Consults: wound care/PT/SW  DVT Prophylaxis: eliquis, renally dosed   Family Communication: [name] queen, POA   Severity of Illness: The appropriate patient status for this patient is OBSERVATION. Observation status is judged to be reasonable and necessary in order to provide the required intensity of service to ensure the patient's safety. The patient's presenting symptoms, physical exam findings, and initial radiographic and laboratory data in the context of their medical condition is felt to place them at decreased risk for further clinical deterioration. Furthermore, it is anticipated that the patient will be medically stable for discharge from the hospital within 2 midnights of admission.   Author: Orma Flaming, MD 06/29/2021 6:38 PM  For on call review [email or web address].

## 2021-06-29 NOTE — Assessment & Plan Note (Addendum)
Patient has chronic venous disease and did develop worsening erythema in both legs with increased drainage.  Thought to have bilateral lower extremity cellulitis.  Started on broad-spectrum antibiotics with vancomycin and cefepime.   ?Patient was never febrile.  WBC was normal.  Procalcitonin less than 0.1.  Vancomycin and cefepime transitioned to cephalexin.  Patient with numerous antibiotic allergies.  Cultures have been negative so far. ?Seen by wound care nurse. X-ray of the feet did not show any abnormalities. ?We will do cephalexin for 7 more days. ?

## 2021-06-29 NOTE — ED Notes (Signed)
Attempted report. Stated nurse is in a contact room, will call me after getting out.  ?

## 2021-06-29 NOTE — Telephone Encounter (Signed)
I called and spoke with [name], she is aware FL2 will be signed Monday when PCP returns. ? ?FL2 on pcp's desk.  ?

## 2021-06-29 NOTE — Assessment & Plan Note (Addendum)
Seen by palliative care.  Changed to DNR ?

## 2021-06-30 ENCOUNTER — Inpatient Hospital Stay (HOSPITAL_COMMUNITY): Payer: Medicare Other

## 2021-06-30 DIAGNOSIS — I5032 Chronic diastolic (congestive) heart failure: Secondary | ICD-10-CM | POA: Diagnosis present

## 2021-06-30 DIAGNOSIS — L03116 Cellulitis of left lower limb: Secondary | ICD-10-CM | POA: Diagnosis present

## 2021-06-30 DIAGNOSIS — Z79891 Long term (current) use of opiate analgesic: Secondary | ICD-10-CM | POA: Diagnosis not present

## 2021-06-30 DIAGNOSIS — L03115 Cellulitis of right lower limb: Secondary | ICD-10-CM | POA: Diagnosis present

## 2021-06-30 DIAGNOSIS — Z79899 Other long term (current) drug therapy: Secondary | ICD-10-CM | POA: Diagnosis not present

## 2021-06-30 DIAGNOSIS — L0390 Cellulitis, unspecified: Secondary | ICD-10-CM | POA: Diagnosis present

## 2021-06-30 DIAGNOSIS — E7800 Pure hypercholesterolemia, unspecified: Secondary | ICD-10-CM | POA: Diagnosis present

## 2021-06-30 DIAGNOSIS — L03119 Cellulitis of unspecified part of limb: Secondary | ICD-10-CM | POA: Diagnosis not present

## 2021-06-30 DIAGNOSIS — N179 Acute kidney failure, unspecified: Secondary | ICD-10-CM | POA: Diagnosis present

## 2021-06-30 DIAGNOSIS — Z20822 Contact with and (suspected) exposure to covid-19: Secondary | ICD-10-CM | POA: Diagnosis present

## 2021-06-30 DIAGNOSIS — R339 Retention of urine, unspecified: Secondary | ICD-10-CM | POA: Diagnosis not present

## 2021-06-30 DIAGNOSIS — E860 Dehydration: Secondary | ICD-10-CM | POA: Diagnosis present

## 2021-06-30 DIAGNOSIS — I872 Venous insufficiency (chronic) (peripheral): Secondary | ICD-10-CM | POA: Diagnosis present

## 2021-06-30 DIAGNOSIS — H409 Unspecified glaucoma: Secondary | ICD-10-CM | POA: Diagnosis present

## 2021-06-30 DIAGNOSIS — Z8673 Personal history of transient ischemic attack (TIA), and cerebral infarction without residual deficits: Secondary | ICD-10-CM | POA: Diagnosis not present

## 2021-06-30 DIAGNOSIS — Z79890 Hormone replacement therapy (postmenopausal): Secondary | ICD-10-CM | POA: Diagnosis not present

## 2021-06-30 DIAGNOSIS — Z7901 Long term (current) use of anticoagulants: Secondary | ICD-10-CM | POA: Diagnosis not present

## 2021-06-30 DIAGNOSIS — I442 Atrioventricular block, complete: Secondary | ICD-10-CM | POA: Diagnosis present

## 2021-06-30 DIAGNOSIS — Z7189 Other specified counseling: Secondary | ICD-10-CM | POA: Diagnosis not present

## 2021-06-30 DIAGNOSIS — I951 Orthostatic hypotension: Secondary | ICD-10-CM | POA: Diagnosis present

## 2021-06-30 DIAGNOSIS — I959 Hypotension, unspecified: Secondary | ICD-10-CM | POA: Diagnosis not present

## 2021-06-30 DIAGNOSIS — I130 Hypertensive heart and chronic kidney disease with heart failure and stage 1 through stage 4 chronic kidney disease, or unspecified chronic kidney disease: Secondary | ICD-10-CM | POA: Diagnosis present

## 2021-06-30 DIAGNOSIS — Z515 Encounter for palliative care: Secondary | ICD-10-CM | POA: Diagnosis not present

## 2021-06-30 DIAGNOSIS — Z66 Do not resuscitate: Secondary | ICD-10-CM | POA: Diagnosis not present

## 2021-06-30 DIAGNOSIS — N1831 Chronic kidney disease, stage 3a: Secondary | ICD-10-CM | POA: Diagnosis present

## 2021-06-30 DIAGNOSIS — B372 Candidiasis of skin and nail: Secondary | ICD-10-CM | POA: Diagnosis present

## 2021-06-30 DIAGNOSIS — R54 Age-related physical debility: Secondary | ICD-10-CM | POA: Diagnosis present

## 2021-06-30 DIAGNOSIS — E039 Hypothyroidism, unspecified: Secondary | ICD-10-CM | POA: Diagnosis present

## 2021-06-30 DIAGNOSIS — Z7401 Bed confinement status: Secondary | ICD-10-CM | POA: Diagnosis not present

## 2021-06-30 DIAGNOSIS — R338 Other retention of urine: Secondary | ICD-10-CM | POA: Diagnosis not present

## 2021-06-30 DIAGNOSIS — E669 Obesity, unspecified: Secondary | ICD-10-CM | POA: Diagnosis present

## 2021-06-30 LAB — CBC
HCT: 35.8 % — ABNORMAL LOW (ref 36.0–46.0)
Hemoglobin: 12.1 g/dL (ref 12.0–15.0)
MCH: 32 pg (ref 26.0–34.0)
MCHC: 33.8 g/dL (ref 30.0–36.0)
MCV: 94.7 fL (ref 80.0–100.0)
Platelets: 281 10*3/uL (ref 150–400)
RBC: 3.78 MIL/uL — ABNORMAL LOW (ref 3.87–5.11)
RDW: 13.7 % (ref 11.5–15.5)
WBC: 8.3 10*3/uL (ref 4.0–10.5)
nRBC: 0 % (ref 0.0–0.2)

## 2021-06-30 LAB — URINALYSIS, COMPLETE (UACMP) WITH MICROSCOPIC
Bilirubin Urine: NEGATIVE
Glucose, UA: NEGATIVE mg/dL
Hgb urine dipstick: NEGATIVE
Ketones, ur: NEGATIVE mg/dL
Nitrite: NEGATIVE
Protein, ur: NEGATIVE mg/dL
Specific Gravity, Urine: 1.014 (ref 1.005–1.030)
pH: 5 (ref 5.0–8.0)

## 2021-06-30 LAB — PROTEIN / CREATININE RATIO, URINE
Creatinine, Urine: 84.28 mg/dL
Protein Creatinine Ratio: 0.21 mg/mg{Cre} — ABNORMAL HIGH (ref 0.00–0.15)
Total Protein, Urine: 18 mg/dL

## 2021-06-30 LAB — BASIC METABOLIC PANEL
Anion gap: 11 (ref 5–15)
BUN: 34 mg/dL — ABNORMAL HIGH (ref 8–23)
CO2: 18 mmol/L — ABNORMAL LOW (ref 22–32)
Calcium: 7.5 mg/dL — ABNORMAL LOW (ref 8.9–10.3)
Chloride: 105 mmol/L (ref 98–111)
Creatinine, Ser: 1.58 mg/dL — ABNORMAL HIGH (ref 0.44–1.00)
GFR, Estimated: 31 mL/min — ABNORMAL LOW (ref >60)
Glucose, Bld: 85 mg/dL (ref 70–99)
Potassium: 4.2 mmol/L (ref 3.5–5.1)
Sodium: 134 mmol/L — ABNORMAL LOW (ref 135–145)

## 2021-06-30 LAB — TSH: TSH: 3.559 u[IU]/mL (ref 0.350–4.500)

## 2021-06-30 LAB — CORTISOL: Cortisol, Plasma: 21.2 ug/dL

## 2021-06-30 MED ORDER — MIDODRINE HCL 5 MG PO TABS
10.0000 mg | ORAL_TABLET | Freq: Three times a day (TID) | ORAL | Status: DC
  Administered 2021-06-30 – 2021-07-03 (×10): 10 mg via ORAL
  Filled 2021-06-30 (×10): qty 2

## 2021-06-30 MED ORDER — MIDODRINE HCL 5 MG PO TABS
5.0000 mg | ORAL_TABLET | Freq: Three times a day (TID) | ORAL | Status: DC
  Administered 2021-06-30: 5 mg via ORAL
  Filled 2021-06-30: qty 1

## 2021-06-30 MED ORDER — SODIUM CHLORIDE 0.9 % IV BOLUS
250.0000 mL | Freq: Once | INTRAVENOUS | Status: AC
  Administered 2021-06-30: 250 mL via INTRAVENOUS

## 2021-06-30 MED ORDER — MIDODRINE HCL 5 MG PO TABS
5.0000 mg | ORAL_TABLET | Freq: Once | ORAL | Status: AC
  Administered 2021-06-30: 5 mg via ORAL
  Filled 2021-06-30: qty 1

## 2021-06-30 MED ORDER — SODIUM CHLORIDE 0.9 % IV SOLN: INTRAVENOUS | Status: AC

## 2021-06-30 MED ORDER — ENSURE ENLIVE PO LIQD
237.0000 mL | Freq: Two times a day (BID) | ORAL | Status: DC
  Administered 2021-06-30 – 2021-07-03 (×6): 237 mL via ORAL

## 2021-06-30 MED ORDER — SODIUM CHLORIDE 0.9 % IV BOLUS
500.0000 mL | Freq: Once | INTRAVENOUS | Status: AC
  Administered 2021-06-30: 500 mL via INTRAVENOUS

## 2021-06-30 NOTE — Progress Notes (Signed)
[facility] PROGRESS NOTE   [name] LZJ:[id number] DOB: [birth year] DOA: 06/29/2021  0 DOS: the patient was seen and examined on 06/30/2021  PCP: [place], [name], MD  Brief History and Hospital Course:  88 y.o. female with medical history significant of CHF, CHB s/p PPM, HLD, invasive ductal carcinoma of the breast, thyroid disease, venous insufficiency with venous ulcer, CKD stage III, hx of CVA in 2020/2021, admitted for suspected stroke in January 2023 and was started on Eliquis at that time, who presented to ED with bilateral leg weakness. She states the redness in her legs has gotten much worse. Also has increased pain and pain with standing as skin breakdown on bottom of feet.  Patient was started to have lower extremity cellulitis in the setting of chronic venous disease.  Hospitalized for further management.    Consultants: None  Procedures: None yet    Subjective: Patient complains of pain in her feet and legs.  Denies any chest pain shortness of breath.  No lightheadedness.  No nausea vomiting.  Not very communicative.    Assessment/Plan:   * Cellulitis in setting of chronic venous stasis  Patient has chronic venous disease and did develop worsening erythema in both legs with increased drainage.  Thought to have bilateral lower extremity cellulitis.  Started on broad-spectrum antibiotics with vancomycin and cefepime.   Noted to be afebrile.  WBC is normal.  Lactic acid level was normal. Wound care nurse has been consulted. Follow-up on cultures.  X-ray of the feet did not show any abnormalities.  Acute renal failure superimposed on stage 3a chronic kidney disease (HCC) Baseline creatinine of 1.1-1.2.  Has a history of renal cell carcinoma with right nephrectomy. Apparently has not been eating or drinking anything since symptom onset.  Noted to be quite dehydrated at admission.  Creatinine was 1.78.  Was given IV fluids.  Improvement in creatinine noted  1.58.  Had to undergo in and out catheterization this morning.  Continue to monitor urine output.  Avoid nephrotoxic agents.  We will continue with maintenance IV fluids.    Candidal intertrigo Started on clotrimazole BID and nystatin powder BID   (HFpEF) heart failure with preserved ejection fraction (HCC) Echocardiogram from January showed EF of 55 to 60%.  Lasix is on hold.  Blood pressure noted to be low.  Noted to be on midodrine.  Will increase the dose of midodrine.  We will also give her fluid bolus since she appears to be hypovolemic.  Not particularly symptomatic though lethargic.    Orthostatic hypotension See above under heart failure  Hypothyroidism Continue levothyroxine.  TSH is 3.5  Counseling regarding goals of care Reasonable to consult palliative care for goals of care conversation.  Stroke aborted by administration of thrombolytic agent ([place]) - R MCA s/p tPA and mechanical thrombectomy, unk embolic source History of CVA in 2020 and 2021.  Admitted for suspected stroke in January 2023.  Started on Eliquis which is being continued.  Does not take statin due to diarrhea.  Complete heart block s/p PPM  Status post pacemaker in 2009.  Revision of pacemaker in 2016.      Obesity Estimated body mass index is 32.73 kg/m as calculated from the following:   Height as of this encounter: '5\' 7"'$  (1.702 m).   Weight as of this encounter: 94.8 kg.   DVT Prophylaxis: On Eliquis Code Status: Full code Family Communication: Discussed with patient.  No family at bedside.  Call her POA later  today. Disposition Plan: To be determined.  Will likely need to go to skilled nursing facility for short-term rehab  Status is: Observation The patient will require care spanning > 2 midnights and should be moved to inpatient because: Continue need for IV antibiotics for lower extremity cellulitis     Medications: Scheduled:  apixaban  2.5 mg Oral BID   clotrimazole   Topical BID    latanoprost  1 drop Both Eyes QHS   levothyroxine  50 mcg Oral QAC breakfast   midodrine  5 mg Oral TID WC   nystatin   Topical BID   vancomycin variable dose per unstable renal function (pharmacist dosing)   Does not apply See admin instructions   Continuous:  ceFEPime (MAXIPIME) IV     sodium chloride     XBJ:[id number] **OR** acetaminophen  Antibiotics: Anti-infectives (From admission, onward)    Start     Dose/Rate Route Frequency Ordered Stop   06/30/21 1600  ceFEPIme (MAXIPIME) 2 g in sodium chloride 0.9 % 100 mL IVPB        2 g 200 mL/hr over 30 Minutes Intravenous Every 24 hours 06/29/21 1627     06/29/21 1620  vancomycin variable dose per unstable renal function (pharmacist dosing)         Does not apply See admin instructions 06/29/21 1620     06/29/21 1530  ceFEPIme (MAXIPIME) 2 g in sodium chloride 0.9 % 100 mL IVPB        2 g 200 mL/hr over 30 Minutes Intravenous  Once 06/29/21 1516 06/29/21 1652   06/29/21 1530  vancomycin (VANCOREADY) IVPB 1250 mg/250 mL        1,250 mg 166.7 mL/hr over 90 Minutes Intravenous  Once 06/29/21 1516 06/29/21 1903   06/29/21 1315  vancomycin (VANCOCIN) IVPB 1000 mg/200 mL premix  Status:  Discontinued        1,000 mg 200 mL/hr over 60 Minutes Intravenous  Once 06/29/21 1309 06/29/21 1437       Objective:  Vital Signs  Vitals:   06/30/21 0515 06/30/21 0759 06/30/21 0812 06/30/21 0854  BP: (!) 135/107 (!) 71/32 (!) 74/40 (!) 79/40  Pulse: 91 (!) 59    Resp: 18 19    Temp: 98.1 F (36.7 C) 98.4 F (36.9 C)    TempSrc: Oral Oral    SpO2: 98% 98%    Weight:      Height:        Intake/Output Summary (Last 24 hours) at 06/30/2021 0931 Last data filed at 06/30/2021 0640 Gross per 24 hour  Intake 240 ml  Output 550 ml  Net -310 ml   Filed Weights   06/29/21 1617  Weight: 94.8 kg    General appearance: Awake alert.  In no distress.  Slightly lethargic Resp: Clear to auscultation bilaterally.  Normal effort Cardio:  S1-S2 is normal regular.  No S3-S4.  No rubs murmurs or bruit GI: Abdomen is soft.  Nontender nondistended.  Bowel sounds are present normal.  No masses organomegaly Extremities: Both legs were covered in dressing.  Erythema noted bilateral feet.  Pulses were palpable. Neurologic:  No focal neurological deficits.    Lab Results:  Data Reviewed: I have personally reviewed labs and imaging study reports  CBC: Recent Labs  Lab 06/29/21 1413 06/30/21 0054  WBC 8.9 8.3  NEUTROABS 7.2  --   HGB 13.3 12.1  HCT 41.3 35.8*  MCV 95.2 94.7  PLT 337 578    Basic Metabolic Panel:  Recent Labs  Lab 06/29/21 1413 06/30/21 0054  NA 134* 134*  K 4.4 4.2  CL 101 105  CO2 22 18*  GLUCOSE 95 85  BUN 32* 34*  CREATININE 1.78* 1.58*  CALCIUM 8.0* 7.5*    GFR: Estimated Creatinine Clearance: 27.4 mL/min (A) (by C-G formula based on SCr of 1.58 mg/dL (H)).  Liver Function Tests: Recent Labs  Lab 06/29/21 1413  AST 26  ALT 22  ALKPHOS 131*  BILITOT 0.5  PROT 5.7*  ALBUMIN 2.3*     Coagulation Profile: Recent Labs  Lab 06/29/21 1413  INR 1.1     Thyroid Function Tests: Recent Labs    06/30/21 0054  TSH 3.559     Recent Results (from the past 240 hour(s))  Resp Panel by RT-PCR (Flu A&B, Covid) Nasopharyngeal Swab     Status: None   Collection Time: 06/29/21 12:29 PM   Specimen: Nasopharyngeal Swab; Nasopharyngeal(NP) swabs in vial transport medium  Result Value Ref Range Status   SARS Coronavirus 2 by RT PCR NEGATIVE NEGATIVE Final    Comment: (NOTE) SARS-CoV-2 target nucleic acids are NOT DETECTED.  The SARS-CoV-2 RNA is generally detectable in upper respiratory specimens during the acute phase of infection. The lowest concentration of SARS-CoV-2 viral copies this assay can detect is 138 copies/mL. A negative result does not preclude SARS-Cov-2 infection and should not be used as the sole basis for treatment or other patient management decisions. A negative  result may occur with  improper specimen collection/handling, submission of specimen other than nasopharyngeal swab, presence of viral mutation(s) within the areas targeted by this assay, and inadequate number of viral copies(<138 copies/mL). A negative result must be combined with clinical observations, patient history, and epidemiological information. The expected result is Negative.  Fact Sheet for Patients:  EntrepreneurPulse.com.au  Fact Sheet for Healthcare Providers:  IncredibleEmployment.be  This test is no t yet approved or cleared by the [place] FDA and  has been authorized for detection and/or diagnosis of SARS-CoV-2 by FDA under an Emergency Use Authorization (EUA). This EUA will remain  in effect (meaning this test can be used) for the duration of the COVID-19 declaration under Section 564(b)(1) of the Act, 21 U.S.C.section 360bbb-3(b)(1), unless the authorization is terminated  or revoked sooner.       Influenza A by PCR NEGATIVE NEGATIVE Final   Influenza B by PCR NEGATIVE NEGATIVE Final    Comment: (NOTE) The Xpert Xpress SARS-CoV-2/FLU/RSV plus assay is intended as an aid in the diagnosis of influenza from Nasopharyngeal swab specimens and should not be used as a sole basis for treatment. Nasal washings and aspirates are unacceptable for Xpert Xpress SARS-CoV-2/FLU/RSV testing.  Fact Sheet for Patients: EntrepreneurPulse.com.au  Fact Sheet for Healthcare Providers: IncredibleEmployment.be  This test is not yet approved or cleared by the [place] FDA and has been authorized for detection and/or diagnosis of SARS-CoV-2 by FDA under an Emergency Use Authorization (EUA). This EUA will remain in effect (meaning this test can be used) for the duration of the COVID-19 declaration under Section 564(b)(1) of the Act, 21 U.S.C. section 360bbb-3(b)(1), unless the authorization is  terminated or revoked.  Performed at [facility], [place] [address], [place], [place] [address]       Radiology Studies: DG Chest Port 1 View  Result Date: 06/29/2021 CLINICAL DATA:  Provided history: Questionable sepsis. Evaluate for abnormality. Bilateral lower extremity swelling/redness. EXAM: PORTABLE CHEST 1 VIEW COMPARISON:  Prior chest radiographs 09/21/2019 and earlier.  FINDINGS: Cardiomegaly. Aortic atherosclerosis. Redemonstrated foci of scarring within the left lung base. No appreciable airspace consolidation or pulmonary edema. No evidence of pleural effusion or pneumothorax. No acute bony abnormality identified. Left chest single lead AICD. IMPRESSION: No evidence of acute cardiopulmonary abnormality. Redemonstrated foci of scarring within the left lung base. Cardiomegaly. Aortic Atherosclerosis (ICD10-I70.0). Electronically Signed   By: Kellie Simmering D.O.   On: 06/29/2021 13:30   DG Foot 2 Views Left  Result Date: 06/29/2021 CLINICAL DATA:  Foot ulcer. EXAM: LEFT FOOT - 2 VIEW COMPARISON:  None. FINDINGS: There is no evidence for acute fracture or dislocation. There are severe degenerative changes at the first interphalangeal joint with joint space loss and osteophyte formation. There is no osseous erosion or periosteal reaction. There is a plantar calcaneal spur. Soft tissues are within normal limits. IMPRESSION: 1. No acute bony abnormality. Electronically Signed   By: Ronney Asters M.D.   On: 06/29/2021 18:57   DG Foot 2 Views Right  Result Date: 06/29/2021 CLINICAL DATA:  Ulceration. EXAM: RIGHT FOOT - 2 VIEW COMPARISON:  None. FINDINGS: There is no acute fracture or dislocation. No cortical erosions are identified. No periosteal reaction. There are moderate degenerative changes at the first metatarsophalangeal joint with joint space narrowing and osteophyte formation. There are vascular calcifications in the soft tissues. Soft tissues are otherwise within normal limits. Plantar  calcaneal spur is present. IMPRESSION: 1. No acute bony abnormality. Electronically Signed   By: Ronney Asters M.D.   On: 06/29/2021 19:10       LOS: 0 days   [facility] Pager on [email or web address]  06/30/2021, 9:31 AM

## 2021-06-30 NOTE — Consult Note (Addendum)
WOC Nurse Consult Note: ?Reason for Consult:Bilateral LEs with weeping dermatitis. Full thickness wound on 4th digit of right foot ?Wound type:venous insufficiency, cellulitis   ?Pressure Injury POA: Yes ?Measurement:1cm round x 0.1cm full thickness wound on right foot, anterior 4th digit ?Wound bed:red ?Drainage (amount, consistency, odor) small ?Periwound:macerated ?Dressing procedure/placement/frequency: Patient with erythema, edema, peeling skin on plantar aspects of feet. Will cleanse with skin cleanser, pat gently, but thoroughly dry. Dress with xeroform (antimicrobial, nonadherent) to areas of weeping, top with dry gauze, ABD and secure with Kerlix roll gauze. Place feet into [employer].  A sacral foam dressing is to be placed for pressure injury prophylaxis. Topical care to the right foot, 4th digit will be with twice daily painting with povidone iodine solution (betadine swab sticks) and left open to air.  ? ?[place] nursing team will not follow, but will remain available to this patient, the nursing and medical teams.  Please re-consult if needed. ?Thanks, ?Maudie Flakes, MSN, RN, [place], [place], CWON-AP, [place]  ?Pager# [phone]  ? ? ?07/03/21: Addendum:  Previous note indicated that There was a PI POA.  This was documented in error and should say N/A on that line. ? ?[place] nursing team will not follow, but will remain available to this patient, the nursing and medical teams.  Please re-consult if needed. ?Thanks, ?Maudie Flakes, MSN, RN, [place], [place], CWON-AP, [place]  ?Pager# [phone]  ?  ?

## 2021-06-30 NOTE — Progress Notes (Signed)
PT Cancellation Note ? ?Patient Details ?Name: [name] ?MRN: [id number] ?DOB: Jul 08, 1927 ? ? ?Cancelled Treatment:    Reason Eval/Treat Not Completed: Patient not medically ready (pt currently receiving bolus due to SBP 70) ? ? ?[name] ?06/30/2021, 8:36 AM ?Brodie Correll P, PT ?Acute Rehabilitation Services ?Pager: [phone] ?Office: [phone] ? ?

## 2021-06-30 NOTE — Progress Notes (Addendum)
?   06/30/21 0759  ?Assess: MEWS Score  ?Temp 98.4 ?F (36.9 ?C)  ?BP (!) 71/32  ?Pulse Rate (!) 59  ?Resp 19  ?SpO2 98 %  ?O2 Device Room Air  ?Assess: MEWS Score  ?MEWS Temp 0  ?MEWS Systolic 2  ?MEWS Pulse 0  ?MEWS RR 0  ?MEWS LOC 0  ?MEWS Score 2  ?MEWS Score Color Yellow  ?Assess: if the MEWS score is Yellow or Red  ?Were vital signs taken at a resting state? Yes  ?Focused Assessment No change from prior assessment  ?Early Detection of Sepsis Score *See Row Information* Low  ?MEWS guidelines implemented *See Row Information* Yes  ?Treat  ?MEWS Interventions Administered scheduled meds/treatments  ?Take Vital Signs  ?Increase Vital Sign Frequency  Yellow: Q 2hr X 2 then Q 4hr X 2, if remains yellow, continue Q 4hrs  ?Escalate  ?MEWS: Escalate Yellow: discuss with charge nurse/RN and consider discussing with provider and RRT  ?Notify: Charge Nurse/RN  ?Name of Charge Nurse/RN Notified Rod Holler RN  ?Date Charge Nurse/RN Notified 06/30/21  ?Time Charge Nurse/RN Notified [phone]  ?Notify: Provider  ?Provider Name/Title Dr. [place] Pink  ?Date Provider Notified 06/30/21  ?Time Provider Notified [phone]  ?Notification Type Page  ?Notification Reason Other (Comment) ?(BP and MEWS)  ?Provider response See new orders  ?Date of Provider Response 06/30/21  ?Time of Provider Response [phone]  ?Document  ?Patient Outcome Stabilized after interventions  ?Progress note created (see row info) Yes  ? ?Verbal order for 250 cc bolus of normal saline. Will continue to monitor patient. ?

## 2021-06-30 NOTE — Evaluation (Signed)
Physical Therapy Evaluation ?Patient Details ?Name: [name] ?MRN: [id number] ?DOB: 08-12-1927 ?Today's Date: 06/30/2021 ? ?History of Present Illness ? 88 yo female admitted 3/3 with bil LE weakness and cellulits. Pt with CVA in Jan with D/C to [facility] place. Pt checked herself out 2/13 had a fall 2/17 and unable to get out of chair at home with no bil LE dressing changes since 2/17. PMhx: CHF, CHB s/p PPM, breast CA, thyroid disease, CVA, venous insufficiency, delusional disorder, CKD  ?Clinical Impression ? [name] pleasant and thrilled to have eaten breakfast. Pt states she was walking at home with RW and eating boiled eggs and toast until she was unable to get OOB. Pt states prior to [facility] she was caring for herself but unable to reliably state how she was functioning with return home after [place]. Pt with decreased cognition, balance, strength and transfers who will benefit from acute therapy to maximize mobility, safety and function with pt agreeable to SNF at D/C.  ? ?Supine 95/59 (55) ?Sitting 81/48 (60) ?Standing 76/47 (57) ?Pt asymptomatic with HR 62 and sats 95% activity limited by BP   ?   ? ?Recommendations for follow up therapy are one component of a multi-disciplinary discharge planning process, led by the attending physician.  Recommendations may be updated based on patient status, additional functional criteria and insurance authorization. ? ?Follow Up Recommendations Skilled nursing-short term rehab (<3 hours/day) ? ?  ?Assistance Recommended at Discharge Intermittent Supervision/Assistance  ?Patient can return home with the following ? A lot of help with walking and/or transfers;A little help with bathing/dressing/bathroom;Assistance with cooking/housework;Direct supervision/assist for financial management;Direct supervision/assist for medications management;Assist for transportation ? ?  ?Equipment Recommendations BSC/3in1  ?Recommendations for Other Services ? OT consult  ?  ?Functional  Status Assessment Patient has had a recent decline in their functional status and/or demonstrates limited ability to make significant improvements in function in a reasonable and predictable amount of time  ? ?  ?Precautions / Restrictions Precautions ?Precautions: Fall;Other (comment) ?Precaution Comments: watch BP, bil LE wounds  ? ?  ? ?Mobility ? Bed Mobility ?Overal bed mobility: Needs Assistance ?Bed Mobility: Supine to Sit, Sit to Supine ?  ?  ?Supine to sit: Min assist, HOB elevated ?Sit to supine: Mod assist, +2 for physical assistance ?  ?General bed mobility comments: min assist to move legs to EOB with assist of pad and rail with HOB 30 degrees with increased time. Return to supine with mod +2 with assist to bring legs to surface and control trunk ?  ? ?Transfers ?Overall transfer level: Needs assistance ?  ?Transfers: Sit to/from Stand ?Sit to Stand: Min assist, +2 physical assistance ?  ?  ?  ?  ?  ?General transfer comment: min assist to rise from bed with cues for hand placement and assist to rise with pt able to stand x 2 trials. No dizziness with standing. pt able to side step toward [facility] with RW and min assist +2 for safety. Unable to transfer OOB due to hypotension ?  ? ?Ambulation/Gait ?  ?  ?  ?  ?  ?  ?  ?General Gait Details: unable to attempt this session ? ?Stairs ?  ?  ?  ?  ?  ? ?Wheelchair Mobility ?  ? ?Modified Rankin (Stroke Patients Only) ?  ? ?  ? ?Balance Overall balance assessment: Needs assistance ?  ?Sitting balance-Leahy Scale: Fair ?Sitting balance - Comments: pt able to sit EOB without UE support ?  ?  Standing balance support: Bilateral upper extremity supported ?Standing balance-Leahy Scale: Poor ?Standing balance comment: bil UE support on RW in standing ?  ?  ?  ?  ?  ?  ?  ?  ?  ?  ?  ?   ? ? ? ?Pertinent Vitals/Pain Pain Assessment ?Pain Assessment: Faces ?Pain Score: 4  ?Pain Location: bil LE at times with pressure ?Pain Descriptors / Indicators: Aching ?Pain  Intervention(s): Limited activity within patient's tolerance, Monitored during session, Repositioned  ? ? ?Home Living Family/patient expects to be discharged to:: Skilled nursing facility ?Living Arrangements: Alone ?Available Help at Discharge: Available PRN/intermittently ?Type of Home: House ?Home Access: Ramped entrance ?  ?  ?  ?Home Layout: One level ?Home Equipment: Conservation officer, nature (2 wheels);Cane - single point;BSC/3in1 ?   ?  ?Prior Function Prior Level of Function : Needs assist ?  ?  ?  ?  ?  ?  ?Mobility Comments: walks with RW ?ADLs Comments: sponge bathes, was driving to church and to the cafeteria for meals ?  ? ? ?Hand Dominance  ?   ? ?  ?Extremity/Trunk Assessment  ? Upper Extremity Assessment ?Upper Extremity Assessment: Generalized weakness ?  ? ?Lower Extremity Assessment ?Lower Extremity Assessment: Generalized weakness (bil LE cellulitis with open wounds) ?  ? ?Cervical / Trunk Assessment ?Cervical / Trunk Assessment: Kyphotic  ?Communication  ? Communication: HOH  ?Cognition Arousal/Alertness: Awake/alert ?Behavior During Therapy: [facility] for tasks assessed/performed ?Overall Cognitive Status: Impaired/Different from baseline ?Area of Impairment: Memory, Attention, Safety/judgement, Awareness ?  ?  ?  ?  ?  ?  ?  ?  ?  ?Current Attention Level: Selective ?Memory: Decreased short-term memory ?  ?Safety/Judgement: Decreased awareness of safety, Decreased awareness of deficits ?  ?  ?  ?  ?  ? ?  ?General Comments   ? ?  ?Exercises General Exercises - Lower Extremity ?Long Arc Quad: AROM, Both, Seated, 10 reps ?Hip Flexion/Marching: AROM, Both, 10 reps, Seated  ? ?Assessment/Plan  ?  ?PT Assessment Patient needs continued PT services  ?PT Problem List Decreased strength;Decreased mobility;Decreased safety awareness;Decreased activity tolerance;Decreased balance;Decreased knowledge of use of DME;Decreased cognition;Cardiopulmonary status limiting activity;Decreased skin integrity ? ?   ?  ?PT  Treatment Interventions DME instruction;Therapeutic exercise;Gait training;Balance training;Functional mobility training;Therapeutic activities;Patient/family education;Cognitive remediation;Neuromuscular re-education   ? ?PT Goals (Current goals can be found in the Care Plan section)  ?Acute Rehab PT Goals ?Patient Stated Goal: be able to walk and keep living ?PT Goal Formulation: With patient ?Time For Goal Achievement: 07/14/21 ?Potential to Achieve Goals: Fair ? ?  ?Frequency Min 2X/week ?  ? ? ?Co-evaluation   ?  ?  ?  ?  ? ? ?  ?AM-PAC PT "6 Clicks" Mobility  ?Outcome Measure Help needed turning from your back to your side while in a flat bed without using bedrails?: A Little ?Help needed moving from lying on your back to sitting on the side of a flat bed without using bedrails?: A Lot ?Help needed moving to and from a bed to a chair (including a wheelchair)?: Total ?Help needed standing up from a chair using your arms (e.g., wheelchair or bedside chair)?: A Lot ?Help needed to walk in hospital room?: Total ?Help needed climbing 3-5 steps with a railing? : Total ?6 Click Score: 10 ? ?  ?End of Session   ?Activity Tolerance: Patient tolerated treatment well ?Patient left: in bed;with call bell/phone within reach;with bed alarm set ?Nurse Communication: Mobility  status ?PT Visit Diagnosis: Other abnormalities of gait and mobility (R26.89);Difficulty in walking, not elsewhere classified (R26.2);Muscle weakness (generalized) (M62.81) ?  ? ?Time: 0998-3382 ?PT Time Calculation (min) (ACUTE ONLY): 30 min ? ? ?Charges:   PT Evaluation ?$PT Eval Moderate Complexity: 1 Mod ?PT Treatments ?$Therapeutic Activity: 8-22 mins ?  ?   ? ? ?Zacharius Funari P, PT ?Acute Rehabilitation Services ?Pager: [phone] ?Office: [phone] ? ? ?Haidee Stogsdill B Saeed Toren ?06/30/2021, 12:05 PM ? ?

## 2021-06-30 NOTE — Progress Notes (Signed)
Patient has no urine output noted since admission at 2045. Bladder scan showed 326m. X. Blount,NP made aware. New order for In on out Cath. I&0 cath done with another nurse.Patient was a little bit combative while doing the I&0 cath(tried to hit the other nurse). Output 5580m  ? ?Call bell within reach , fall precautions in place. And will continue to close monitor. ?

## 2021-06-30 NOTE — Consult Note (Signed)
?Palliative Medicine Inpatient Consult Note ? ?Consulting Provider: [name], MD ? ?Reason for consult:   ?[name]  ?Reason for Consult? GOC  ? ? ?HPI:  ?Per intake H&P --> 88 y.o. female with medical history significant of CHF, CHB s/p PPM, HLD, invasive ductal carcinoma of the breast, thyroid disease, venous insufficiency with venous ulcer, CKD stage III, hx of CVA in 2020/2021, admitted for suspected stroke in January 2023 and was started on Eliquis at that time, who presented to ED with bilateral leg weakness. She states the redness in her legs has gotten much worse. Also has increased pain and pain with standing as skin breakdown on bottom of feet.  Patient was started to have lower extremity cellulitis in the setting of chronic venous disease.  Hospitalized for further management. ? ?Palliative care has been asked to get involved for further [place] conversations.  ? ?Clinical Assessment/Goals of Care: ? ?*Please note that this is a verbal dictation therefore any spelling or grammatical errors are due to the "[name]" system interpretation. ? ?I have reviewed medical records including EPIC notes, labs and imaging, received report from bedside RN, assessed the patient.  ?  ?I met with [name] to further discuss diagnosis prognosis, GOC, EOL wishes, disposition and options. ?  ?I introduced Palliative Medicine as specialized medical care for people living with serious illness. It focuses on providing relief from the symptoms and stress of a serious illness. The goal is to improve quality of life for both the patient and the family. ? ?Medical History Review and Understanding: ? ?A review of choices prior CVA, CHF, hyperlipidemia, heart block requiring a pacemaker, and chronic kidney disease was had. ? ?Social History: ? ?[name] has lived in [place] all her life.  She was married for 60 years and 15 days though is a widow.  She never had  children.  She did have a dog at 1 point in time who she had to give away due to her new home not allowing dogs.  She shares with me that she worked at [employer] throughout her career.  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  Through speaking with her it is obvious that she is a very proud woman. ? ?Functional and Nutritional State: ? ?[name] was at home doing basic activities of daily living prior to her fall 4 months ago which left her debilitated requiring a prolonged stay in skilled nursing.  She was home for 20 days before hospitalization in the setting of lower extremity cellulitis. ? ?[name] has a fair appetite. ? ?Palliative Symptoms: ? ?Generalized Weakness in the setting of fall 4 months ago and debilitated state in the setting of bilateral lower extremity cellulitis. ? ?Advance Directives: ? ?A detailed discussion was had today regarding advanced directives.  Patient shares she is completed healthcare power of attorney and living will documents.  Her surrogate decision maker is her friend [name].  A copy of these have been requested ? ?Code Status: ? ?Concepts specific to code status, artifical feeding and hydration, continued IV antibiotics and rehospitalization was had.   [name] is clear that she would want any and all measures completed to save her life.  I reviewed with her that based upon her multiple chronic comorbidities, elderly state, and worsening frailty over the past few months that I worry we would cause her  body a lot of trauma with little to no benefit if we were to pursue these aggressive measures.  At this point in time [name] would still like to be full code though she shares with me that she will think more about this in the oncoming days ? ?Discussion: ? ?[name] and I reviewed her acute on chronic medical conditions.  She shares with me that her goals for the future are to improve to the point where  she can do her own laundry, and go back to church again.  I asked her what she would do with these things could not happen.  She took some time to think about this at this time does not have a good answer for me. ? ?Discussed the importance of continued conversation with family and their  medical providers regarding overall plan of care and treatment options, ensuring decisions are within the context of the patients values and GOCs. ? ?_____________________________ ?Addendum: ? ?I called and spoke with patient's HCPOA, Nichole this afternoon.  I shared it might be impactful if we all meet together for further conversations.  Plan for an additional meeting Monday at 1:00 to further review goals of care. ? ?Nichole expresses concerns about [name]'s declining health state over the past 4 months.  He shares with me that she does not look like she has much fight left which is worrisome to him knowing her prior level of resilience. ? ?Decision Maker: ? ?SUMMARY OF RECOMMENDATIONS   ?FULL CODE/ FULL SCOPE OF CARE --> Plan for ongoing conversations about what this means ? ?Requested Advance Directives from patients [name], Nichole ? ?Plan for family meeting Monday at 1300 ? ?Appreciate chaplain support ? ?Ongoing Palliative Support ? ?Code Status/Advance Care Planning: ?FULL CODE ? ?Palliative Prophylaxis:  ?Aspiration, Bowel Regimen, Delirium Protocol, Frequent Pain Assessment, Oral Care, Palliative Wound Care, and Turn Reposition ? ?Additional Recommendations (Limitations, Scope, Preferences): ?Full Scope Treatment ? ?Psycho-social/Spiritual:  ?Desire for further Chaplaincy support: Yes ?Additional Recommendations: Education on chronic comorbid conditions ?  ?Prognosis: Overall prognosis is worrisome in the setting of declining health state over the last 4 months, multiple chronic comorbid conditions, increased frailty.  Patient has a high 50-monthmortality risk. ? ?Discharge Planning: Discharge will likely be to skilled nursing  when medically optimized. ? ? ?Vitals:  ? 06/30/21 1236 06/30/21 1600  ?BP: (!) 82/42 (!) 142/58  ?Pulse: 60 60  ?Resp: 15 17  ?Temp: 97.8 ?F (36.6 ?C) 98 ?F (36.7 ?C)  ?SpO2: 97% 97%  ? ? ?Intake/Output Summary (Last 24 hours) at 06/30/2021 1710 ?Last data filed at 06/30/2021 1350 ?Gross per 24 hour  ?Intake 495 ml  ?Output 950 ml  ?Net -455 ml  ? ?Last Weight  Most recent update: 06/29/2021  4:18 PM  ? ? Weight  ?94.8 kg (209 lb)  ?      ? ?  ? ?Gen: Frail elderly Caucasian female in no acute distress ?HEENT: moist mucous membranes ?CV: Regular rate and rhythm ?PULM: On room air breathing is even and nonlabored ?ABD: soft/nontender ?EXT: No edema ?Neuro: Alert and oriented x3 ? ?PPS: ? ? ?This conversation/these recommendations were discussed with patient primary care team, Dr. KMaryland Pink? ?Total Time: 77 ? ?MDM High ?______________________________________________________ ?MTacey Ruiz?[facility] ?Team Cell Phone: [phone]?Please utilize secure chat with additional questions, if there is no response within 30 minutes please call the above phone number ? ?Palliative Medicine Team providers are available by phone from 7am to 7pm  daily and can be reached through the team cell phone.  ?Should this patient require assistance outside of these hours, please call the patient's attending physician. ? ?

## 2021-06-30 NOTE — Hospital Course (Addendum)
88 y.o. female with medical history significant of CHF, CHB s/p PPM, HLD, invasive ductal carcinoma of the breast, thyroid disease, venous insufficiency with venous ulcer, CKD stage III, hx of CVA in 2020/2021, admitted for suspected stroke in January 2023 and was started on Eliquis at that time, who presented to ED with bilateral leg weakness. She states the redness in her legs has gotten much worse. Also has increased pain and pain with standing as skin breakdown on bottom of feet.  Patient was started to have lower extremity cellulitis in the setting of chronic venous disease.  Hospitalized for further management.  Noted to be hypotensive on midodrine.  Developed urinary retention requiring Foley catheter placement. ?

## 2021-06-30 NOTE — Progress Notes (Signed)
?   06/29/21 2357  ?Assess: MEWS Score  ?Temp 98.8 ?F (37.1 ?C)  ?BP (!) 72/34 ?(Patient is alert and verbally responsive. X. Blount,NP made aware)  ?Pulse Rate 60  ?Resp 18  ?SpO2 95 %  ?O2 Device Room Air  ?Assess: MEWS Score  ?MEWS Temp 0  ?MEWS Systolic 2  ?MEWS Pulse 0  ?MEWS RR 0  ?MEWS LOC 0  ?MEWS Score 2  ?MEWS Score Color Yellow  ?Assess: if the MEWS score is Yellow or Red  ?Were vital signs taken at a resting state? Yes  ?Focused Assessment Change from prior assessment (see assessment flowsheet)  ?Early Detection of Sepsis Score *See Row Information* Low  ?MEWS guidelines implemented *See Row Information* Yes  ?Treat  ?MEWS Interventions Escalated (See documentation below)  ?Take Vital Signs  ?Increase Vital Sign Frequency  Yellow: Q 2hr X 2 then Q 4hr X 2, if remains yellow, continue Q 4hrs  ?Escalate  ?MEWS: Escalate Yellow: discuss with charge nurse/RN and consider discussing with provider and RRT  ?Notify: Charge Nurse/RN  ?Name of Charge Nurse/RN Notified [place]  ?Date Charge Nurse/RN Notified 06/30/21  ?Time Charge Nurse/RN Notified 0001  ?Notify: Provider  ?Provider Name/Title Noberto Retort  ?Date Provider Notified 06/30/21  ?Time Provider Notified 0001  ?Notification Type  ?(secure chat)  ?Notification Reason Change in status  ?Provider response See new orders ?(increase continuous fluids(NS '@75ml'$ /hr) to 181m/hr)  ?Date of Provider Response 06/30/21  ?Time of Provider Response 0019  ?Document  ?Patient Outcome Stabilized after interventions  ? ?[id number] '@100ml'$ /hr was stopped per order.  ? ?04:09 repeat V/S for Yellow MEWS (BP 77/31). Writer secure chat X. Blount,NP and message back to continue NS'@100ML'$ /hr until 0700 ? ?05:15. Repeat BP 135/101. Patient alert and verbally responsive. Will continue to close monitor patient. ? ?

## 2021-07-01 DIAGNOSIS — I959 Hypotension, unspecified: Secondary | ICD-10-CM

## 2021-07-01 DIAGNOSIS — R338 Other retention of urine: Secondary | ICD-10-CM

## 2021-07-01 DIAGNOSIS — N1831 Chronic kidney disease, stage 3a: Secondary | ICD-10-CM

## 2021-07-01 LAB — URINE CULTURE: Culture: NO GROWTH

## 2021-07-01 LAB — BASIC METABOLIC PANEL
Anion gap: 7 (ref 5–15)
BUN: 28 mg/dL — ABNORMAL HIGH (ref 8–23)
CO2: 20 mmol/L — ABNORMAL LOW (ref 22–32)
Calcium: 7.3 mg/dL — ABNORMAL LOW (ref 8.9–10.3)
Chloride: 108 mmol/L (ref 98–111)
Creatinine, Ser: 1.34 mg/dL — ABNORMAL HIGH (ref 0.44–1.00)
GFR, Estimated: 37 mL/min — ABNORMAL LOW (ref >60)
Glucose, Bld: 88 mg/dL (ref 70–99)
Potassium: 4.2 mmol/L (ref 3.5–5.1)
Sodium: 135 mmol/L (ref 135–145)

## 2021-07-01 LAB — CBC
HCT: 35.5 % — ABNORMAL LOW (ref 36.0–46.0)
Hemoglobin: 11.3 g/dL — ABNORMAL LOW (ref 12.0–15.0)
MCH: 30.5 pg (ref 26.0–34.0)
MCHC: 31.8 g/dL (ref 30.0–36.0)
MCV: 95.9 fL (ref 80.0–100.0)
Platelets: 295 10*3/uL (ref 150–400)
RBC: 3.7 MIL/uL — ABNORMAL LOW (ref 3.87–5.11)
RDW: 14.1 % (ref 11.5–15.5)
WBC: 6.8 10*3/uL (ref 4.0–10.5)
nRBC: 0 % (ref 0.0–0.2)

## 2021-07-01 LAB — VANCOMYCIN, RANDOM: Vancomycin Rm: 7

## 2021-07-01 MED ORDER — SODIUM CHLORIDE 0.9 % IV BOLUS
500.0000 mL | Freq: Once | INTRAVENOUS | Status: AC
  Administered 2021-07-01: 500 mL via INTRAVENOUS

## 2021-07-01 MED ORDER — SODIUM CHLORIDE 0.9 % IV SOLN
2.0000 g | Freq: Two times a day (BID) | INTRAVENOUS | Status: DC
  Administered 2021-07-01 – 2021-07-02 (×3): 2 g via INTRAVENOUS
  Filled 2021-07-01 (×3): qty 2

## 2021-07-01 MED ORDER — VANCOMYCIN HCL 1250 MG/250ML IV SOLN
1250.0000 mg | INTRAVENOUS | Status: DC
  Administered 2021-07-01: 1250 mg via INTRAVENOUS
  Filled 2021-07-01: qty 250

## 2021-07-01 NOTE — Progress Notes (Signed)
Pharmacy Antibiotic Note ? ?[name] is a 88 y.o. female admitted on 06/29/2021 with a wound infection.  Pharmacy has been consulted for vancomycin and cefepime dosing. Patient presented with malaise, weakness, and difficulty ambulating.Patient noted to have bilateral leg swelling with suspected wound infection. Patient is currently afebrile. WBC is WNL. Creatinine is down to 1.34- will schedule vancomycin today. Vanc Random returned at 7 on 3/5. ? ?Plan: ?- Vancomycin 1250 mg IV q48h (eAUC 498, Scr 1.34) ?- Adjust Cefepime to 2g IV q12h ?-Monitor renal function, micro data, and clinical progression ?- Vanc levels as indicated ? ?Temp (24hrs), Avg:98 ?F (36.7 ?C), Min:97.6 ?F (36.4 ?C), Max:98.3 ?F (36.8 ?C) ? ?Recent Labs  ?Lab 06/29/21 ?1413 06/30/21 ?4287 07/01/21 ?0038  ?WBC 8.9 8.3 6.8  ?CREATININE 1.78* 1.58* 1.34*  ?LATICACIDVEN 1.8  --   --   ?VANCORANDOM  --   --  7  ? ?  ?Estimated Creatinine Clearance: 32.3 mL/min (A) (by C-G formula based on SCr of 1.34 mg/dL (H)).   ? ?Allergies  ?Allergen Reactions  ? Amoxicillin-Pot Clavulanate Diarrhea and Nausea And Vomiting  ?  Has patient had a PCN reaction causing immediate rash, facial/tongue/throat swelling, SOB or lightheadedness with hypotension: Yes ?Has patient had a PCN reaction causing severe rash involving mucus membranes or skin necrosis: No ?Has patient had a PCN reaction that required hospitalization: No ?Has patient had a PCN reaction occurring within the last 10 years: Yes ?If all of the above answers are "NO", then may proceed with Cephalosporin use. ?  ? Erythromycin Hives  ? Tape Other (See Comments)  ?  BAND AIDS-SKIN IRRITATION  ? Codeine Nausea And Vomiting  ? Doxycycline Diarrhea  ? Flagyl [Metronidazole] Hives  ? Macrobid [Nitrofurantoin Macrocrystal] Nausea And Vomiting  ? Protonix [Pantoprazole]   ?  Other reaction(s): diarrhea  ? Tolterodine Nausea And Vomiting  ? Ciprofloxacin Hives and Rash  ? ? ?Antimicrobials this  admission: ?Vancomycin 3/3 >>  ?Cefepime 3/3 >>  ? ?Dose adjustments this admission: ?Cefepime 2g IV q24h adjusted to q12h on 3/5 ?Scheduled Vancomycin 1250 mg IV q48h on 3/5 ? ?Microbiology results: ?3/3 BCx: NG ?3/3 UCx: NG ? ? ?Thank you for allowing pharmacy to participate in this patient's care. ? ?Lestine Box, PharmD ?PGY2 Infectious Diseases Pharmacy Resident ?  ?Please check [email or web address] for unit-specific pharmacy phone numbers  ? ?

## 2021-07-01 NOTE — Progress Notes (Signed)
? ?  Palliative Medicine Inpatient Follow Up Note ? ?HPI:  ?Per intake H&P --> 88 y.o. female with medical history significant of CHF, CHB s/p PPM, HLD, invasive ductal carcinoma of the breast, thyroid disease, venous insufficiency with venous ulcer, CKD stage III, hx of CVA in 2020/2021, admitted for suspected stroke in January 2023 and was started on Eliquis at that time, who presented to ED with bilateral leg weakness. She states the redness in her legs has gotten much worse. Also has increased pain and pain with standing as skin breakdown on bottom of feet.  Patient was started to have lower extremity cellulitis in the setting of chronic venous disease.  Hospitalized for further management. ?  ?Palliative care has been asked to get involved for further [place] conversations.  ? ?Today's Discussion (07/01/2021): ? ?*Please note that this is a verbal dictation therefore any spelling or grammatical errors are due to the "[place]" system interpretation. ? ?Chart reviewed inclusive of vital signs, progress notes, laboratory results, and diagnostic images.  ? ?I met with [name] at bedside this morning. She shares that she is not feeling well today and complains of generalized weakness. Reviewed that she has cellulitis and acute kidney failure which are likely contributing. We reviewed that in addition to this she has been declining for the last four months or so since her fall and wrist fracture/ I shared that these are concerning factors. Reviewed that her blood pressure is low which is another worry. ? ?[name] is clear about "not being ready to lay down and die." She wants to continue to get better "even if she has to receive goats blood". I shared that I understand her wishes and I am hopeful we can have more open and honest conversation tomorrow at 1300 when [name] comes into the hospital. She is agreeable to this. ? ?Questions and concerns addressed  ? ?Palliative Support Provided ? ?Objective Assessment: ?Vital  Signs ?Vitals:  ? 07/01/21 0749 07/01/21 0910  ?BP: (!) 78/44 (!) 82/40  ?Pulse: 60 60  ?Resp: 17 18  ?Temp: 98.3 ?F (36.8 ?C) 98.2 ?F (36.8 ?C)  ?SpO2: 96% 96%  ? ? ?Intake/Output Summary (Last 24 hours) at 07/01/2021 0955 ?Last data filed at 07/01/2021 0403 ?Gross per 24 hour  ?Intake 355 ml  ?Output 900 ml  ?Net -545 ml  ? ?Last Weight  Most recent update: 06/29/2021  4:18 PM  ? ? Weight  ?94.8 kg (209 lb)  ?      ? ?  ? ?Gen: Frail elderly Caucasian female in no acute distress ?HEENT: moist mucous membranes ?CV: Regular rate and rhythm ?PULM: On room air breathing is even and nonlabored ?ABD: soft/nontender ?EXT: No edema ?Neuro: Alert and oriented x3 ? ?SUMMARY OF RECOMMENDATIONS   ?FULL CODE/ FULL SCOPE OF CARE --> Plan for ongoing conversations about what this means ?  ?Requested Advance Directives from patients [place], [name] ?  ?Plan for family meeting Monday at 1300 ?  ?Appreciate chaplain support ?  ?Ongoing Palliative Support ? ?MDM - Moderate ?______________________________________________________________________________________ ?Tacey Ruiz ?[place] Team ?Team Cell Phone: [phone] ?Please utilize secure chat with additional questions, if there is no response within 30 minutes please call the above phone number ? ?Palliative Medicine Team providers are available by phone from 7am to 7pm daily and can be reached through the team cell phone.  ?Should this patient require assistance outside of these hours, please call the patient's attending physician. ? ? ? ? ?

## 2021-07-01 NOTE — Progress Notes (Signed)
?   07/01/21 0749  ?Assess: MEWS Score  ?Temp 98.3 ?F (36.8 ?C)  ?BP (!) 78/44  ?Pulse Rate 60  ?Resp 17  ?SpO2 96 %  ?O2 Device Room Air  ?Assess: MEWS Score  ?MEWS Temp 0  ?MEWS Systolic 2  ?MEWS Pulse 0  ?MEWS RR 0  ?MEWS LOC 0  ?MEWS Score 2  ?MEWS Score Color Yellow  ?Assess: if the MEWS score is Yellow or Red  ?Were vital signs taken at a resting state? Yes  ?Focused Assessment No change from prior assessment  ?Early Detection of Sepsis Score *See Row Information* Low  ?MEWS guidelines implemented *See Row Information* No, previously yellow, continue vital signs every 4 hours  ?Treat  ?MEWS Interventions Administered scheduled meds/treatments  ?Notify: Provider  ?Provider Name/Title Dr.Krishnan  ?Date Provider Notified 07/01/21  ?Time Provider Notified [phone]  ?Notification Type Face-to-face  ?Notification Reason Other (Comment) ?(Blood pressure)  ?Provider response At bedside  ?Date of Provider Response 07/01/21  ?Time of Provider Response [id number]  ? ? Per MD administer scheduled medication and reassess vital signs within one hour. Will continue to monitor ?

## 2021-07-01 NOTE — Progress Notes (Signed)
Patient has asymptomatic hypotension with SBP 70s. Midodrine was increased yesterday and she was given small fluid boluses. Plan to give another fluid bolus now and continue close monitoring.  ?

## 2021-07-01 NOTE — TOC Initial Note (Addendum)
Transition of Care (TOC) - Initial/Assessment Note  ? ? ?Patient Details  ?Name: [name] ?MRN: [id number] ?Date of Birth: July 23, 1927 ? ?Transition of Care (TOC) CM/SW Contact:    ?Milas Gain, LCSWA ?Phone Number: ?07/01/2021, 11:29 AM ? ?Clinical Narrative:                 ? ?CSW received consult for possible SNF placement at time of discharge. CSW spoke with patient at bedside regarding PT recommendation of SNF placement at time of discharge.  Patient expressed understanding of PT recommendation and is agreeable to SNF placement at time of discharge. Patient reports preference for [facility] . Patient gave CSW permission to fax out initial referral near the [place] area. Patient reports she was at [facility] about 3 weeks ago. Patient unsure of dates that she was at facility.CSW discussed insurance authorization process and will provide medicare compare ratings list with accepted SNF bed offers when available.Patient reports she has received the COVID vaccines. Patient expressed being hopeful for rehab and to feel better soon. Patient gave CSW permission to discuss her dc plan with her HCPOA [name]. CSW called [name] and LVM. CSW awaiting callback.No further questions reported at this time. CSW to continue to follow and assist with discharge planning needs.  ? ?Update- CSW received callback from [name] patients [place]. CSW updated [name] on [facility]. [name] informed CSW that patient was at [facility] about 3 weeks ago and was in her copay days. CSW informed [name] CSW tomorrow will follow up with SNF on status of rehab days/copay. CSW to follow up with patient and patients HCPOA tomorrow. ? ?Expected Discharge Plan: [place] ?Barriers to Discharge: Continued Medical Work up ? ? ?Patient Goals and CMS Choice ?Patient states their goals for this hospitalization and ongoing recovery are:: SNF ?CMS Medicare.gov Compare Post Acute Care list provided to:: Patient ?Choice offered to / list presented to :  Patient ? ?Expected Discharge Plan and Services ?Expected Discharge Plan: [place] ?  ?  ?  ?Living arrangements for the past 2 months: [place] ?                ?  ?  ?  ?  ?  ?  ?  ?  ?  ?  ? ?Prior Living Arrangements/Services ?Living arrangements for the past 2 months: [place] ?Lives with:: Self ?Patient language and need for interpreter reviewed:: Yes ?       ?Need for Family Participation in Patient Care: Yes (Comment) ?Care giver support system in place?: Yes (comment) ?  ?Criminal Activity/Legal Involvement Pertinent to Current Situation/Hospitalization: No - Comment as needed ? ?Activities of Daily Living ?Home Assistive Devices/Equipment: [place] Rile (specify type) ?ADL Screening (condition at time of admission) ?Patient's cognitive ability adequate to safely complete daily activities?: No (poor choices in facility and at home) ?Is the patient deaf or have difficulty hearing?: Yes ?Does the patient have difficulty seeing, even when wearing glasses/contacts?: Yes ?Does the patient have difficulty concentrating, remembering, or making decisions?: Yes ?Patient able to express need for assistance with ADLs?: Yes ?Does the patient have difficulty dressing or bathing?: Yes ?Independently performs ADLs?: No ?Communication: Independent ?Dressing (OT): Needs assistance ?Is this a change from baseline?: Pre-admission baseline ?Grooming: Needs assistance ?Is this a change from baseline?: Pre-admission baseline ?Feeding: Independent ?Bathing: Needs assistance ?Is this a change from baseline?: Pre-admission baseline ?Toileting: Needs assistance ?Is this a change from baseline?: Pre-admission baseline ?In/Out Bed: Needs  assistance ?Is this a change from baseline?: Pre-admission baseline ?Walks in Home: Needs assistance ?Is this a change from baseline?: Pre-admission baseline ?Does the patient have difficulty walking or climbing stairs?: Yes ?Weakness of Legs: Both (painful  cellulitis) ?Weakness of Arms/Hands: None ? ?Permission Sought/Granted ?Permission sought to share information with : Case Manager, Family Supports, Customer service manager ?Permission granted to share information with : Yes, Verbal Permission Granted ? Share Information with NAME: [name] ? Permission granted to share info w AGENCY: SNF ? Permission granted to share info w Relationship: HCPOA ? Permission granted to share info w Contact Information: [name] [phone] ? ?Emotional Assessment ?Appearance:: Appears stated age ?Attitude/Demeanor/Rapport: Gracious ?Affect (typically observed): Calm ?Orientation: : Oriented to Self, Oriented to Place, Oriented to  Time, Oriented to Situation (WDL) ?Alcohol / Substance Use: Not Applicable ?Psych Involvement: No (comment) ? ?Admission diagnosis:  Cellulitis [L03.90] ?Ulcerated, foot ([place]) [Z66.063] ?Leg wound, left [S81.802A] ?Cellulitis of left lower extremity [L03.116] ?Cellulitis of right lower extremity [L03.115] ?AKI (acute kidney injury) ([place]) [N17.9] ?Patient Active Problem List  ? Diagnosis Date Noted  ? Acute urinary retention 07/01/2021  ? Acute renal failure superimposed on stage 3a chronic kidney disease ([place]) 06/29/2021  ? Candidal intertrigo 06/29/2021  ? Counseling regarding goals of care 06/29/2021  ? Venous stasis ulcer of left lower extremity ([place]) 06/15/2021  ? Stroke [facility]) 05/03/2021  ? Stroke aborted by administration of thrombolytic agent ([place]) - R MCA s/p tPA and mechanical thrombectomy, unk embolic source 01/60/1093  ? (HFpEF) heart failure with preserved ejection fraction ([place]) 09/19/2019  ? Morbid obesity ([place]) 09/19/2019  ? CKD (chronic kidney disease), stage III ([place]) 09/17/2019  ? Spontaneous subarachnoid hemorrhage ([place]) 10/04/2018  ? Unstable gait 01/19/2018  ? Venous stasis dermatitis of both lower extremities 01/09/2018  ? Diarrhea, chronic 01/09/2018  ? Lower extremity ulceration ([place]) 10/22/2017  ? PONV (postoperative nausea and  vomiting)   ? Orthostatic hypotension   ? Hypercholesterolemia   ? Exogenous obesity   ? Rectal bleeding 03/30/2017  ? Sigmoid diverticulitis 10/14/2016  ? Diverticulitis 10/14/2016  ? Varicose veins of bilateral lower extremities with other complications 23/55/7322  ? Venous insufficiency 08/25/2015  ? Cellulitis in setting of chronic venous stasis  08/09/2015  ? Pressure ulcer 08/09/2015  ? Open wound of right foot 07/07/2015  ? Delusional disorder ([place]) 07/05/2015  ? Bereavement reaction 07/05/2015  ? Pacemaker at end of battery life 02/23/2015  ? Acute kidney injury (nontraumatic) ([place]) 02/15/2015  ? Hypothyroidism 02/15/2015  ? Left upper quadrant pain   ? Osteopenia 12/22/2013  ? Postural dizziness 04/05/2013  ? Complete heart block s/p PPM  02/26/2013  ? S/P placement of cardiac pacemaker 02/26/2013  ? Bilateral breast cancer ([place]) 03/25/2011  ? Invasive ductal carcinoma of breast ([place]) 03/30/2007  ? ?PCP:  Martinique, [name], MD ?Pharmacy:   ?[place], [place] - [address] ?[address] ?[place] [address] ?Phone: [phone] Fax: [phone] ? ?[id number]?Phone: [phone]: [phone]? ? ? ? ?Social Determinants of Health (SDOH) Interventions ?  ? ?Readmission Risk Interventions ?No flowsheet data found. ? ? ?

## 2021-07-01 NOTE — Progress Notes (Signed)
[facility] PROGRESS NOTE   [name] TIR:[id number] DOB: 1927/11/04 DOA: 06/29/2021  1 DOS: the patient was seen and examined on 07/01/2021  PCP: Martinique, [name], MD  Brief History and Hospital Course:  88 y.o. female with medical history significant of CHF, CHB s/p PPM, HLD, invasive ductal carcinoma of the breast, thyroid disease, venous insufficiency with venous ulcer, CKD stage III, hx of CVA in 2020/2021, admitted for suspected stroke in January 2023 and was started on Eliquis at that time, who presented to ED with bilateral leg weakness. She states the redness in her legs has gotten much worse. Also has increased pain and pain with standing as skin breakdown on bottom of feet.  Patient was started to have lower extremity cellulitis in the setting of chronic venous disease.  Hospitalized for further management.  Noted to be hypotensive on midodrine.  Developed urinary retention requiring Foley catheter placement.  Consultants: None  Procedures: None yet    Subjective: Denies any significant pain in her lower extremities.  No chest pain shortness of breath, nausea or vomiting or diarrhea.  Denies any dizziness or lightheadedness.  More awake and alert today compared to yesterday.    Assessment/Plan:   * Cellulitis in setting of chronic venous stasis  Patient has chronic venous disease and did develop worsening erythema in both legs with increased drainage.  Thought to have bilateral lower extremity cellulitis.  Started on broad-spectrum antibiotics with vancomycin and cefepime.   Remains afebrile.  WBC has been normal.  We will check procalcitonin and MRSA PCR tomorrow morning.  Lactic acid level was normal.  Cultures negative so far. Seen by wound care nurse. X-ray of the feet did not show any abnormalities. Continue current antibiotics for 24 hours and then de-escalate tomorrow depending on clinical progress.  Acute renal failure superimposed on stage 3a chronic  kidney disease (HCC) Baseline creatinine of 1.1-1.2.  Has a history of renal cell carcinoma with right nephrectomy. Apparently had not been eating or drinking anything since symptom onset.  Noted to be quite dehydrated at admission.  Creatinine was 1.78.  Was given IV fluids.   Renal function gradually improving.  Down to 1.34 today.  Experiencing urinary retention which is also contributing. Avoid nephrotoxic agents.  Monitor urine output.    Candidal intertrigo Started on clotrimazole BID and nystatin powder BID   Acute urinary retention Foley catheter had to be placed overnight after multiple in and out catheterizations.  This is likely due to immobilization.  Due to low blood pressure cannot use Flomax or bethanechol.  Plus she has a history of glaucoma  (HFpEF) heart failure with preserved ejection fraction (HCC) Echocardiogram from January showed EF of 55 to 60%.  Lasix is on hold.   Blood pressure remains low though she is completely asymptomatic.  Cortisol level was normal.  She is on no blood pressure lowering agents.  No evidence for overwhelming sepsis or any blood loss or hypovolemia. Continue midodrine the dose of which was increased yesterday.  Continue to monitor.    Orthostatic hypotension See above under heart failure  Stroke aborted by administration of thrombolytic agent ([place]) - R MCA s/p tPA and mechanical thrombectomy, unk embolic source History of CVA in 2020 and 2021.  Admitted for suspected stroke in January 2023.  She was started on Eliquis which is being continued.  Does not take statin due to diarrhea.  Hypothyroidism Continue levothyroxine.  TSH is 3.5  Complete heart block s/p PPM  Status post pacemaker in 2009.  Revision of pacemaker in 2016.    Counseling regarding goals of care Palliative care consulted.    Obesity Estimated body mass index is 32.73 kg/m as calculated from the following:   Height as of this encounter: '5\' 7"'$  (1.702 m).   Weight as  of this encounter: 94.8 kg.   DVT Prophylaxis: On Eliquis Code Status: Full code Family Communication: Discussed with patient.  Her POA was updated yesterday. Disposition Plan: Skilled nursing facility for rehab is anticipated.  Waiting on PT and OT evaluation.  Status is: Inpatient Remains inpatient appropriate because: Cellulitis of lower extremities, hypertension      Medications: Scheduled:  apixaban  2.5 mg Oral BID   clotrimazole   Topical BID   feeding supplement  237 mL Oral BID BM   latanoprost  1 drop Both Eyes QHS   levothyroxine  50 mcg Oral QAC breakfast   midodrine  10 mg Oral TID WC   nystatin   Topical BID   Continuous:  ceFEPime (MAXIPIME) IV     vancomycin     PVX:[id number] **OR** acetaminophen  Antibiotics: Anti-infectives (From admission, onward)    Start     Dose/Rate Route Frequency Ordered Stop   07/01/21 1000  vancomycin (VANCOREADY) IVPB 1250 mg/250 mL        1,250 mg 166.7 mL/hr over 90 Minutes Intravenous Every 48 hours 07/01/21 0857     07/01/21 1000  ceFEPIme (MAXIPIME) 2 g in sodium chloride 0.9 % 100 mL IVPB        2 g 200 mL/hr over 30 Minutes Intravenous Every 12 hours 07/01/21 0900     06/30/21 1600  ceFEPIme (MAXIPIME) 2 g in sodium chloride 0.9 % 100 mL IVPB  Status:  Discontinued        2 g 200 mL/hr over 30 Minutes Intravenous Every 24 hours 06/29/21 1627 07/01/21 0900   06/29/21 1620  vancomycin variable dose per unstable renal function (pharmacist dosing)  Status:  Discontinued         Does not apply See admin instructions 06/29/21 1620 07/01/21 0857   06/29/21 1530  ceFEPIme (MAXIPIME) 2 g in sodium chloride 0.9 % 100 mL IVPB        2 g 200 mL/hr over 30 Minutes Intravenous  Once 06/29/21 1516 06/29/21 1652   06/29/21 1530  vancomycin (VANCOREADY) IVPB 1250 mg/250 mL        1,250 mg 166.7 mL/hr over 90 Minutes Intravenous  Once 06/29/21 1516 06/29/21 1903   06/29/21 1315  vancomycin (VANCOCIN) IVPB 1000 mg/200 mL premix   Status:  Discontinued        1,000 mg 200 mL/hr over 60 Minutes Intravenous  Once 06/29/21 1309 06/29/21 1437       Objective:  Vital Signs  Vitals:   07/01/21 0300 07/01/21 0600 07/01/21 0749 07/01/21 0910  BP: (!) 78/33 (!) 131/121 (!) 78/44 (!) 82/40  Pulse: 60 67 60 60  Resp: '20 20 17 18  '$ Temp: 97.7 F (36.5 C) 98.2 F (36.8 C) 98.3 F (36.8 C) 98.2 F (36.8 C)  TempSrc: Axillary Oral Oral Oral  SpO2: 96% 96% 96% 96%  Weight:      Height:        Intake/Output Summary (Last 24 hours) at 07/01/2021 0941 Last data filed at 07/01/2021 0403 Gross per 24 hour  Intake 355 ml  Output 900 ml  Net -545 ml    Filed Weights   06/29/21 1617  Weight: 94.8 kg    General appearance: Awake alert.  In no distress Resp: Clear to auscultation bilaterally.  Normal effort Cardio: S1-S2 is normal regular.  No S3-S4.  No rubs murmurs or bruit GI: Abdomen is soft.  Nontender nondistended.  Bowel sounds are present normal.  No masses organomegaly Extremities: Has chronic venous disease bilateral lower extremities with erythema of both feet.  Good pedal pulses right greater than left.  Covered in dressing today. Neurologic:  No focal neurological deficits.      Lab Results:  Data Reviewed: I have personally reviewed labs and imaging study reports  CBC: Recent Labs  Lab 06/29/21 1413 06/30/21 0054 07/01/21 0038  WBC 8.9 8.3 6.8  NEUTROABS 7.2  --   --   HGB 13.3 12.1 11.3*  HCT 41.3 35.8* 35.5*  MCV 95.2 94.7 95.9  PLT 337 281 295     Basic Metabolic Panel: Recent Labs  Lab 06/29/21 1413 06/30/21 0054 07/01/21 0038  NA 134* 134* 135  K 4.4 4.2 4.2  CL 101 105 108  CO2 22 18* 20*  GLUCOSE 95 85 88  BUN 32* 34* 28*  CREATININE 1.78* 1.58* 1.34*  CALCIUM 8.0* 7.5* 7.3*     GFR: Estimated Creatinine Clearance: 32.3 mL/min (A) (by C-G formula based on SCr of 1.34 mg/dL (H)).  Liver Function Tests: Recent Labs  Lab 06/29/21 1413  AST 26  ALT 22  ALKPHOS  131*  BILITOT 0.5  PROT 5.7*  ALBUMIN 2.3*      Coagulation Profile: Recent Labs  Lab 06/29/21 1413  INR 1.1      Thyroid Function Tests: Recent Labs    06/30/21 0054  TSH 3.559      Recent Results (from the past 240 hour(s))  Resp Panel by RT-PCR (Flu A&B, Covid) Nasopharyngeal Swab     Status: None   Collection Time: 06/29/21 12:29 PM   Specimen: Nasopharyngeal Swab; Nasopharyngeal(NP) swabs in vial transport medium  Result Value Ref Range Status   SARS Coronavirus 2 by RT PCR NEGATIVE NEGATIVE Final    Comment: (NOTE) SARS-CoV-2 target nucleic acids are NOT DETECTED.  The SARS-CoV-2 RNA is generally detectable in upper respiratory specimens during the acute phase of infection. The lowest concentration of SARS-CoV-2 viral copies this assay can detect is 138 copies/mL. A negative result does not preclude SARS-Cov-2 infection and should not be used as the sole basis for treatment or other patient management decisions. A negative result may occur with  improper specimen collection/handling, submission of specimen other than nasopharyngeal swab, presence of viral mutation(s) within the areas targeted by this assay, and inadequate number of viral copies(<138 copies/mL). A negative result must be combined with clinical observations, patient history, and epidemiological information. The expected result is Negative.  Fact Sheet for Patients:  EntrepreneurPulse.com.au  Fact Sheet for Healthcare Providers:  IncredibleEmployment.be  This test is no t yet approved or cleared by the [place] FDA and  has been authorized for detection and/or diagnosis of SARS-CoV-2 by FDA under an Emergency Use Authorization (EUA). This EUA will remain  in effect (meaning this test can be used) for the duration of the COVID-19 declaration under Section 564(b)(1) of the Act, 21 U.S.C.section 360bbb-3(b)(1), unless the authorization is terminated   or revoked sooner.       Influenza A by PCR NEGATIVE NEGATIVE Final   Influenza B by PCR NEGATIVE NEGATIVE Final    Comment: (NOTE) The Xpert Xpress SARS-CoV-2/FLU/RSV plus assay is intended as an  aid in the diagnosis of influenza from Nasopharyngeal swab specimens and should not be used as a sole basis for treatment. Nasal washings and aspirates are unacceptable for Xpert Xpress SARS-CoV-2/FLU/RSV testing.  Fact Sheet for Patients: EntrepreneurPulse.com.au  Fact Sheet for Healthcare Providers: IncredibleEmployment.be  This test is not yet approved or cleared by the [place] FDA and has been authorized for detection and/or diagnosis of SARS-CoV-2 by FDA under an Emergency Use Authorization (EUA). This EUA will remain in effect (meaning this test can be used) for the duration of the COVID-19 declaration under Section 564(b)(1) of the Act, 21 U.S.C. section 360bbb-3(b)(1), unless the authorization is terminated or revoked.  Performed at [facility], [place] [address] [place], [place] [address]   Blood Culture (routine x 2)     Status: None (Preliminary result)   Collection Time: 06/29/21  2:00 PM   Specimen: BLOOD  Result Value Ref Range Status   Specimen Description BLOOD BLOOD RIGHT HAND  Final   Special Requests   Final    BOTTLES DRAWN AEROBIC AND ANAEROBIC Blood Culture results may not be optimal due to an inadequate volume of blood received in culture bottles   Culture   Final    NO GROWTH < 24 HOURS Performed at [facility], [place] [address], [place], [place] [address]    Report Status PENDING  Incomplete  Blood Culture (routine x 2)     Status: None (Preliminary result)   Collection Time: 06/29/21  2:49 PM   Specimen: BLOOD  Result Value Ref Range Status   Specimen Description BLOOD SITE NOT SPECIFIED  Final   Special Requests   Final    BOTTLES DRAWN AEROBIC AND ANAEROBIC Blood Culture adequate volume    Culture   Final    NO GROWTH < 24 HOURS Performed at [facility], [place] [address] [place], [place] [address]    Report Status PENDING  Incomplete       Radiology Studies: DG Chest Port 1 View  Result Date: 06/29/2021 CLINICAL DATA:  Provided history: Questionable sepsis. Evaluate for abnormality. Bilateral lower extremity swelling/redness. EXAM: PORTABLE CHEST 1 VIEW COMPARISON:  Prior chest radiographs 09/21/2019 and earlier. FINDINGS: Cardiomegaly. Aortic atherosclerosis. Redemonstrated foci of scarring within the left lung base. No appreciable airspace consolidation or pulmonary edema. No evidence of pleural effusion or pneumothorax. No acute bony abnormality identified. Left chest single lead AICD. IMPRESSION: No evidence of acute cardiopulmonary abnormality. Redemonstrated foci of scarring within the left lung base. Cardiomegaly. Aortic Atherosclerosis (ICD10-I70.0). Electronically Signed   By: Kellie Simmering D.O.   On: 06/29/2021 13:30   DG Foot 2 Views Left  Result Date: 06/29/2021 CLINICAL DATA:  Foot ulcer. EXAM: LEFT FOOT - 2 VIEW COMPARISON:  None. FINDINGS: There is no evidence for acute fracture or dislocation. There are severe degenerative changes at the first interphalangeal joint with joint space loss and osteophyte formation. There is no osseous erosion or periosteal reaction. There is a plantar calcaneal spur. Soft tissues are within normal limits. IMPRESSION: 1. No acute bony abnormality. Electronically Signed   By: Ronney Asters M.D.   On: 06/29/2021 18:57   DG Foot 2 Views Right  Result Date: 06/29/2021 CLINICAL DATA:  Ulceration. EXAM: RIGHT FOOT - 2 VIEW COMPARISON:  None. FINDINGS: There is no acute fracture or dislocation. No cortical erosions are identified. No periosteal reaction. There are moderate degenerative changes at the first metatarsophalangeal joint with joint space narrowing and osteophyte formation. There  are vascular calcifications in the soft tissues.  Soft tissues are otherwise within normal limits. Plantar calcaneal spur is present. IMPRESSION: 1. No acute bony abnormality. Electronically Signed   By: Ronney Asters M.D.   On: 06/29/2021 19:10       LOS: 1 day   [facility] Pager on [email or web address]  07/01/2021, 9:41 AM

## 2021-07-01 NOTE — Assessment & Plan Note (Addendum)
Foley catheter had to be placed after multiple in and out catheterizations.  This is likely due to immobilization.   ?Due to low blood pressure we cannot use Flomax or bethanechol.  Plus she has a history of glaucoma. ?Voiding trial once she is more ambulatory. ?

## 2021-07-01 NOTE — [place] FL2 (Signed)
?[place] MEDICAID FL2 LEVEL OF CARE SCREENING TOOL  ?  ? ?IDENTIFICATION  ?Patient Name: ?[name] Birthdate: [birth year] Sex: female Admission Date (Current Location): ?06/29/2021  ?[place] and [place] Number: ? [place] ?  Facility and Address:  ?[facility], [place] [address] [place], [place] [address] ?     Provider Number: ?[id number]  ?Attending Physician Name and Address:  ?[name], MD ? Relative Name and Phone Number:  ?[name]  [phone] ?   ?Current Level of Care: ?Hospital Recommended Level of Care: ?[place] Prior Approval Number: ?  ? ?Date Approved/Denied: ?  PASRR Number: ?[id number] A ? ?Discharge Plan: ?SNF ?  ? ?Current Diagnoses: ?Patient Active Problem List  ? Diagnosis Date Noted  ? Acute urinary retention 07/01/2021  ? Acute renal failure superimposed on stage 3a chronic kidney disease ([place]) 06/29/2021  ? [place] intertrigo 06/29/2021  ? Counseling regarding goals of care 06/29/2021  ? Venous stasis ulcer of left lower extremity ([place]) 06/15/2021  ? Stroke [facility]) 05/03/2021  ? Stroke aborted by administration of thrombolytic agent ([place]) - R MCA s/p tPA and mechanical thrombectomy, unk embolic source 32/95/1884  ? (HFpEF) heart failure with preserved ejection fraction ([place]) 09/19/2019  ? Morbid obesity ([place]) 09/19/2019  ? CKD (chronic kidney disease), stage III ([place]) 09/17/2019  ? Spontaneous subarachnoid hemorrhage ([place]) 10/04/2018  ? Unstable gait 01/19/2018  ? Venous stasis dermatitis of both lower extremities 01/09/2018  ? Diarrhea, chronic 01/09/2018  ? Lower extremity ulceration ([place]) 10/22/2017  ? PONV (postoperative nausea and vomiting)   ? Orthostatic hypotension   ? Hypercholesterolemia   ? Exogenous obesity   ? Rectal bleeding 03/30/2017  ? Sigmoid diverticulitis 10/14/2016  ? Diverticulitis 10/14/2016  ? Varicose veins of bilateral lower extremities with other complications 16/60/6301  ? Venous insufficiency 08/25/2015  ? Cellulitis  in setting of chronic venous stasis  08/09/2015  ? Pressure ulcer 08/09/2015  ? Open wound of right foot 07/07/2015  ? Delusional disorder ([place]) 07/05/2015  ? Bereavement reaction 07/05/2015  ? Pacemaker at end of battery life 02/23/2015  ? Acute kidney injury (nontraumatic) ([place]) 02/15/2015  ? Hypothyroidism 02/15/2015  ? Left upper quadrant pain   ? Osteopenia 12/22/2013  ? Postural dizziness 04/05/2013  ? Complete heart block s/p PPM  02/26/2013  ? S/P placement of cardiac pacemaker 02/26/2013  ? Bilateral breast cancer ([place]) 03/25/2011  ? Invasive ductal carcinoma of breast ([place]) 03/30/2007  ? ? ?Orientation RESPIRATION BLADDER Height & Weight   ?  ?Self, Time, Situation, Place (WDL) ? Normal Continent (Urethral Catheter 14Fr.) Weight: 209 lb (94.8 kg) ?Height:  '5\' 7"'$  (170.2 cm)  ?BEHAVIORAL SYMPTOMS/MOOD NEUROLOGICAL BOWEL NUTRITION STATUS  ?    Continent (WDL) Diet (Please see discharge summary)  ?AMBULATORY STATUS COMMUNICATION OF NEEDS Skin   ?Extensive Assist Verbally Other (Comment) (Appropriate for ethnicity,dry,flaky,ecchymosis,arm,R,L,Erythema,groin,bilateral,gauze,wound incision open or dehiced toe,anterior,R,wound incision open or dehiced heel,L,gauze,foam lift dressing,daily,clean,dry,intact, Please see additional info) ?  ?  ?  ?    ?     ?     ? ? ?Personal Care Assistance Level of Assistance  ?Bathing, Feeding, Dressing Bathing Assistance: Maximum assistance ?Feeding assistance: Independent ?Dressing Assistance: Maximum assistance ?   ? ?Functional Limitations Info  ?Sight, Hearing, Speech Sight Info:  (WDL) ?Hearing Info: Adequate ?Speech Info: Adequate  ? ? ?SPECIAL CARE FACTORS FREQUENCY  ?PT (By licensed PT), OT (By licensed OT)   ?  ?PT Frequency: 5x min weekly ?OT Frequency: 5x min  weekly ?  ?  ?  ?   ? ? ?Contractures Contractures Info: Not present  ? ? ?Additional Factors Info  ?Code Status, Allergies Code Status Info: FULL ?Allergies Info: Amoxicillin-pot  Clavulanate,Erythromycin,Tape,Codeine,Doxycycline,Flagyl (metronidazole),Macrobid (nitrofurantoin Macrocrystal),Protonix (pantoprazole),Tolterodine,Ciprofloxacin ?  ?  ?  ?   ? ?Current Medications (07/01/2021):  This is the current hospital active medication list ?Current Facility-Administered Medications  ?Medication Dose Route Frequency Provider Last Rate Last Admin  ? acetaminophen (TYLENOL) tablet 650 mg  650 mg Oral Q6H PRN Orma Flaming, MD      ? Or  ? acetaminophen (TYLENOL) suppository 650 mg  650 mg Rectal Q6H PRN Orma Flaming, MD      ? apixaban Arne Cleveland) tablet 2.5 mg  2.5 mg Oral BID Orma Flaming, MD   2.5 mg at 07/01/21 0800  ? ceFEPIme (MAXIPIME) 2 g in sodium chloride 0.9 % 100 mL IVPB  2 g Intravenous Q12H Willette Cluster, RPH 200 mL/hr at 07/01/21 1131 2 g at 07/01/21 1131  ? clotrimazole (LOTRIMIN) 1 % cream   Topical BID Orma Flaming, MD   Given at 07/01/21 0801  ? feeding supplement (ENSURE ENLIVE / ENSURE PLUS) liquid 237 mL  237 mL Oral BID BM [name], MD   237 mL at 06/30/21 1648  ? latanoprost (XALATAN) 0.005 % ophthalmic solution 1 drop  1 drop Both Eyes QHS Orma Flaming, MD   1 drop at 06/30/21 2150  ? levothyroxine (SYNTHROID) tablet 50 mcg  50 mcg Oral QAC breakfast Orma Flaming, MD   50 mcg at 07/01/21 0546  ? midodrine (PROAMATINE) tablet 10 mg  10 mg Oral TID WC [name], MD   10 mg at 07/01/21 1149  ? nystatin (MYCOSTATIN/NYSTOP) topical powder   Topical BID Orma Flaming, MD   Given at 07/01/21 0800  ? vancomycin (VANCOREADY) IVPB 1250 mg/250 mL  1,250 mg Intravenous Q48H Willette Cluster, RPH 166.7 mL/hr at 07/01/21 1134 1,250 mg at 07/01/21 1134  ? ? ? ?Discharge Medications: ?Please see discharge summary for a list of discharge medications. ? ?Relevant Imaging Results: ? ?Relevant Lab Results: ? ? ?Additional Information ?[phone], Both Covid Vaccines, Wound Incision open or dehiced venous stasis ulcer pretibial Left,Gauze,Foam Lift  dressing,clean,dry,intact,daily ? ?Milas Gain, LCSWA ? ? ? ? ?

## 2021-07-01 NOTE — Progress Notes (Signed)
OT Cancellation Note ? ?Patient Details ?Name: [name] ?MRN: [id number] ?DOB: 1927/08/08 ? ? ?Cancelled Treatment:    Reason Eval/Treat Not Completed: Patient not medically ready (BP 78/44) Pt currently with hypotension. Ot to check back at a later date. ? ?Jeri Modena ? ?Brynn, OTR/L  ?Acute Rehabilitation Services ?Pager: [phone] ?Office: [phone] ?. ? ?07/01/2021, 8:44 AM ?

## 2021-07-02 LAB — BASIC METABOLIC PANEL
Anion gap: 6 (ref 5–15)
BUN: 26 mg/dL — ABNORMAL HIGH (ref 8–23)
CO2: 20 mmol/L — ABNORMAL LOW (ref 22–32)
Calcium: 7.3 mg/dL — ABNORMAL LOW (ref 8.9–10.3)
Chloride: 113 mmol/L — ABNORMAL HIGH (ref 98–111)
Creatinine, Ser: 1.28 mg/dL — ABNORMAL HIGH (ref 0.44–1.00)
GFR, Estimated: 40 mL/min — ABNORMAL LOW (ref >60)
Glucose, Bld: 89 mg/dL (ref 70–99)
Potassium: 4.2 mmol/L (ref 3.5–5.1)
Sodium: 139 mmol/L (ref 135–145)

## 2021-07-02 LAB — CBC
HCT: 35.5 % — ABNORMAL LOW (ref 36.0–46.0)
Hemoglobin: 11.2 g/dL — ABNORMAL LOW (ref 12.0–15.0)
MCH: 30.9 pg (ref 26.0–34.0)
MCHC: 31.5 g/dL (ref 30.0–36.0)
MCV: 97.8 fL (ref 80.0–100.0)
Platelets: 304 10*3/uL (ref 150–400)
RBC: 3.63 MIL/uL — ABNORMAL LOW (ref 3.87–5.11)
RDW: 14.4 % (ref 11.5–15.5)
WBC: 9.2 10*3/uL (ref 4.0–10.5)
nRBC: 0 % (ref 0.0–0.2)

## 2021-07-02 LAB — MRSA NEXT GEN BY PCR, NASAL: MRSA by PCR Next Gen: NOT DETECTED

## 2021-07-02 LAB — PROCALCITONIN: Procalcitonin: <0.1 ng/mL

## 2021-07-02 MED ORDER — SENNOSIDES-DOCUSATE SODIUM 8.6-50 MG PO TABS
2.0000 | ORAL_TABLET | Freq: Every day | ORAL | Status: DC
  Administered 2021-07-02: 2 via ORAL
  Filled 2021-07-02: qty 2

## 2021-07-02 MED ORDER — CHLORHEXIDINE GLUCONATE CLOTH 2 % EX PADS
6.0000 | MEDICATED_PAD | Freq: Every day | CUTANEOUS | Status: DC
  Administered 2021-07-02 – 2021-07-03 (×2): 6 via TOPICAL

## 2021-07-02 MED ORDER — ADULT MULTIVITAMIN W/MINERALS CH
1.0000 | ORAL_TABLET | Freq: Every day | ORAL | Status: DC
  Administered 2021-07-02 – 2021-07-03 (×2): 1 via ORAL
  Filled 2021-07-02 (×2): qty 1

## 2021-07-02 MED ORDER — APIXABAN 5 MG PO TABS
5.0000 mg | ORAL_TABLET | Freq: Two times a day (BID) | ORAL | Status: DC
Start: 2021-07-02 — End: 2021-07-04
  Administered 2021-07-02 – 2021-07-03 (×3): 5 mg via ORAL
  Filled 2021-07-02 (×3): qty 1

## 2021-07-02 MED ORDER — CEPHALEXIN 500 MG PO CAPS
500.0000 mg | ORAL_CAPSULE | Freq: Four times a day (QID) | ORAL | Status: DC
  Administered 2021-07-02 – 2021-07-03 (×6): 500 mg via ORAL
  Filled 2021-07-02 (×6): qty 1

## 2021-07-02 MED ORDER — POLYETHYLENE GLYCOL 3350 17 G PO PACK
17.0000 g | PACK | Freq: Every day | ORAL | Status: DC
  Administered 2021-07-03: 17 g via ORAL
  Filled 2021-07-02: qty 1

## 2021-07-02 NOTE — Progress Notes (Addendum)
? ?Palliative Medicine Inpatient Follow Up Note ? ?HPI:  ? ?Per intake H&P --> 88 y.o. female with medical history significant of CHF, CHB s/p PPM, HLD, invasive ductal carcinoma of the breast, thyroid disease, venous insufficiency with venous ulcer, CKD stage III, hx of CVA in 2020/2021, admitted for suspected stroke in January 2023 and was started on Eliquis at that time, who presented to ED with bilateral leg weakness. She states the redness in her legs has gotten much worse. Also has increased pain and pain with standing as skin breakdown on bottom of feet.  Patient was started to have lower extremity cellulitis in the setting of chronic venous disease.  Hospitalized for further management. ?  ?Palliative care has been asked to get involved for further [place] conversations.  ? ?Today's Discussion (07/02/2021): ? ?*Please note that this is a verbal dictation therefore any spelling or grammatical errors are due to the "[employer] One" system interpretation. ? ?Chart reviewed inclusive of vital signs, progress notes, laboratory results, and diagnostic images.  ? ?I met with [name] at bedside and assisted the nursing technician with bathing her this afternoon.  ? ?Patients HCPOA, [name] came to bedside. We had a meeting and reviewed [place]'s present health state and her decline over the past four months. Discussed her reason for hospitalization and the severity of her BLE cellulitis.  ? ?A comprehensive conversation regarding code status was held. Encouraged [name] to consider DNR/DNI status understanding evidenced based poor outcomes in similar hospitalized patient, as the cause of arrest is likely associated with advanced chronic/terminal illness rather than an easily reversible acute cardio-pulmonary event. I explained that DNR/DNI does not change the medical plan and it only comes into effect after a person has arrested (died).  It is a protective measure to keep [name] from harming the patient in their last moments  of life. [name] was agreeable to DNR/DNI with understanding that patient would not receive CPR, defibrillation, ACLS medications, or intubation.  ? ?Reviewed the hope from [name]'s perspective to improve her current condition. She shares that if possible she would like to get to a point of functionality again. She was able to get OOB bed to the chair with me. She was moving her legs and sharing, "see I can do it, I can get there." She expresses that she may not be as independent though she feels that she will be able to do for herself again. Reviewed that she will need to go to rehabilitation to accomplish this. ? ?We played out various scenarios in terms of what an acceptable quality of life would be for [name]. She is very clear about needing to have and maintain a degree of independence. She shares that she does not want to be reliant on others.  ? ?For the time being patient is hopeful for rehabilitation, she does not want to go back to [facility] and wishes to go to [facility].  ? ?Questions and concerns addressed  ? ?Palliative Support Provided ? ?Objective Assessment: ?Vital Signs ?Vitals:  ? 07/02/21 0757 07/02/21 1218  ?BP: (!) 85/52 (!) 98/53  ?Pulse: 60 62  ?Resp: 19 19  ?Temp: 97.6 ?F (36.4 ?C) 98.6 ?F (37 ?C)  ?SpO2: 98% 97%  ? ? ?Intake/Output Summary (Last 24 hours) at 07/02/2021 1423 ?Last data filed at 07/02/2021 0413 ?Gross per 24 hour  ?Intake 450 ml  ?Output 450 ml  ?Net 0 ml  ? ? ?Last Weight  Most recent update: 06/29/2021  4:18 PM  ? ?  Weight  ?94.8 kg (209 lb)  ?      ? ?  ? ?Gen: Frail elderly Caucasian female in no acute distress ?HEENT: moist mucous membranes ?CV: Regular rate and rhythm ?PULM: On room air breathing is even and nonlabored ?ABD: soft/nontender ?EXT:(+) BLE cellulitis ?Neuro: Alert and oriented x3 ? ?SUMMARY OF RECOMMENDATIONS   ?DNAR/DNI ?  ?Advance Directives will be scanned into Vynca ?  ?Goals for rehabilitation ?  ?Appreciate chaplain support ?  ?Ongoing Palliative  Support ? ?Total Time: 65 ? ?MDM - High ?______________________________________________________________________________________ ?Tacey Ruiz ?[place] Team ?Team Cell Phone: [phone] ?Please utilize secure chat with additional questions, if there is no response within 30 minutes please call the above phone number ? ?Palliative Medicine Team providers are available by phone from 7am to 7pm daily and can be reached through the team cell phone.  ?Should this patient require assistance outside of these hours, please call the patient's attending physician. ? ? ? ? ?

## 2021-07-02 NOTE — Telephone Encounter (Signed)
Patient is in the hospital and they are working on getting her placed. Will keep note in my basket to make sure nothing else is needed on our end.  ?

## 2021-07-02 NOTE — Evaluation (Signed)
Occupational Therapy Evaluation ?Patient Details ?Name: [name] ?MRN: [id number] ?DOB: 08-Feb-1928 ?Today's Date: 07/02/2021 ? ? ?History of Present Illness 88 yo female admitted 3/3 with bil LE weakness and cellulits. Pt with CVA in Jan with D/C to [facility]. Pt checked herself out 2/13 had a fall 2/17 and unable to get out of chair at home with no bil LE dressing changes since 2/17. PMhx: CHF, CHB s/p PPM, breast CA, thyroid disease, CVA, venous insufficiency, delusional disorder, CKD  ? ?Clinical Impression ?  ?Pt reports walking with a walker and being unable to manage LB bathing and dressing in the 3 weeks she was home from [facility] before being admitted. Pt presents with impaired cognition, generalized weakness and decreased activity tolerance. She needs set up to total assist for ADLs. Session was limited to sitting EOB and participated in grooming as pt continues to demonstrate hypotension. She will need SNF upon discharge and is agreeable.  ?   ? ?Recommendations for follow up therapy are one component of a multi-disciplinary discharge planning process, led by the attending physician.  Recommendations may be updated based on patient status, additional functional criteria and insurance authorization.  ? ?Follow Up Recommendations ? Skilled nursing-short term rehab (<3 hours/day)  ?  ?Assistance Recommended at Discharge Frequent or constant Supervision/Assistance  ?Patient can return home with the following Two people to help with walking and/or transfers;A lot of help with bathing/dressing/bathroom;Assist for transportation;Help with stairs or ramp for entrance;Assistance with cooking/housework;Direct supervision/assist for medications management;Direct supervision/assist for financial management ? ?  ?Functional Status Assessment ? Patient has had a recent decline in their functional status and/or demonstrates limited ability to make significant improvements in function in a reasonable and  predictable amount of time  ?Equipment Recommendations ? Other (comment) (defer to next venue)  ?  ?Recommendations for Other Services   ? ? ?  ?Precautions / Restrictions Precautions ?Precautions: Fall ?Precaution Comments: watch BP, bil LE wounds ?Restrictions ?Weight Bearing Restrictions: No  ? ?  ? ?Mobility Bed Mobility ?Overal bed mobility: Needs Assistance ?Bed Mobility: Rolling, Sidelying to Sit, Sit to Sidelying ?Rolling: Modified independent (Device/Increase time) ?Sidelying to sit: Min assist ?  ?  ?Sit to sidelying: Mod assist ?General bed mobility comments: assist for hips to EOB and to raise trunk and for LEs back into bed ?  ? ?Transfers ?  ?  ?  ?  ?  ?  ?  ?  ?  ?General transfer comment: deferred ?  ? ?  ?Balance Overall balance assessment: Needs assistance ?  ?Sitting balance-Leahy Scale: Fair ?  ?  ?  ?  ?  ?  ?  ?  ?  ?  ?  ?  ?  ?  ?  ?  ?   ? ?ADL either performed or assessed with clinical judgement  ? ?ADL Overall ADL's : Needs assistance/impaired ?Eating/Feeding: Independent;Sitting ?  ?Grooming: Oral care;Sitting;Supervision/safety ?  ?Upper Body Bathing: Minimal assistance;Sitting ?  ?Lower Body Bathing: Total assistance;Sitting/lateral leans;Bed level ?  ?Upper Body Dressing : Minimal assistance;Sitting ?  ?Lower Body Dressing: Total assistance;Bed level ?  ?  ?  ?  ?  ?  ?  ?  ?   ? ? ? ?Vision Ability to See in Adequate Light: 0 Adequate ?Patient Visual Report: No change from baseline ?   ?   ?Perception   ?  ?Praxis   ?  ? ?Pertinent Vitals/Pain Pain Assessment ?Pain Assessment: Faces ?Faces Pain Scale:  Hurts little more ?Pain Location: bil LE at times with pressure ?Pain Descriptors / Indicators: Aching ?Pain Intervention(s): Monitored during session, Repositioned  ? ? ? ?Hand Dominance Right ?  ?Extremity/Trunk Assessment Upper Extremity Assessment ?Upper Extremity Assessment: Generalized weakness ?  ?Lower Extremity Assessment ?Lower Extremity Assessment: Defer to PT evaluation ?   ?Cervical / Trunk Assessment ?Cervical / Trunk Assessment: Kyphotic ?  ?Communication Communication ?Communication: HOH ?  ?Cognition Arousal/Alertness: Awake/alert ?Behavior During Therapy: Anxious ?Overall Cognitive Status: Impaired/Different from baseline ?Area of Impairment: Memory, Attention, Safety/judgement, Awareness ?  ?  ?  ?  ?  ?  ?  ?  ?  ?Current Attention Level: Selective ?Memory: Decreased short-term memory ?  ?Safety/Judgement: Decreased awareness of safety, Decreased awareness of deficits ?Awareness: Emergent ?  ?  ?  ?  ?General Comments    ? ?  ?Exercises   ?  ?Shoulder Instructions    ? ? ?Home Living Family/patient expects to be discharged to:: Skilled nursing facility ?Living Arrangements: Alone ?Available Help at Discharge: Available PRN/intermittently ?Type of Home: House ?Home Access: Ramped entrance ?  ?  ?Home Layout: One level ?  ?  ?Bathroom Shower/Tub: Sponge bathes at baseline ?  ?Bathroom Toilet: Standard ?  ?  ?Home Equipment: Conservation officer, nature (2 wheels);Cane - single point;BSC/3in1 ?  ?  ?  ? ?  ?Prior Functioning/Environment Prior Level of Function : Needs assist ?  ?  ?  ?  ?  ?  ?Mobility Comments: walks with RW ?ADLs Comments: sponge bathes, reports dependence in LB ADLs, was driving prior to her admission to [facility] place ?  ? ?  ?  ?OT Problem List: Decreased strength;Decreased cognition;Impaired balance (sitting and/or standing);Decreased activity tolerance ?  ?   ?OT Treatment/Interventions: Self-care/ADL training;DME and/or AE instruction;Patient/family education;Balance training;Therapeutic activities  ?  ?OT Goals(Current goals can be found in the care plan section) Acute Rehab OT Goals ?OT Goal Formulation: With patient ?Time For Goal Achievement: 07/16/21 ?Potential to Achieve Goals: Fair ?ADL Goals ?Pt Will Perform Grooming: (P) with set-up;sitting ?Pt Will Perform Upper Body Dressing: (P) with set-up;sitting ?Pt Will Transfer to Toilet: (P) with mod  assist;ambulating;bedside commode ?Pt Will Perform Toileting - Clothing Manipulation and hygiene: (P) with min assist;sit to/from stand ?Pt/caregiver will Perform Home Exercise Program: (P) Increased strength;Both right and left upper extremity;With minimal assist ?Additional ADL Goal #1: (P) Pt wil perform bed mobility with supervision in preparation for ADLs.  ?OT Frequency: Min 2X/week ?  ? ?Co-evaluation   ?  ?  ?  ?  ? ?  ?AM-PAC OT "6 Clicks" Daily Activity     ?Outcome Measure Help from another person eating meals?: None ?Help from another person taking care of personal grooming?: A Little ?Help from another person toileting, which includes using toliet, bedpan, or urinal?: Total ?Help from another person bathing (including washing, rinsing, drying)?: A Lot ?Help from another person to put on and taking off regular upper body clothing?: A Little ?Help from another person to put on and taking off regular lower body clothing?: Total ?6 Click Score: 14 ?  ?End of Session Nurse Communication: Other (comment) (ok to mobilize to EOB) ? ?Activity Tolerance: Treatment limited secondary to medical complications (Comment) (pt with hypotension, deferred OOB) ?Patient left: in bed;with call bell/phone within reach;with bed alarm set ? ?OT Visit Diagnosis: Muscle weakness (generalized) (M62.81);Other symptoms and signs involving cognitive function;Unsteadiness on feet (R26.81);History of falling (Z91.81)  ?              ?  Time: 3893-7342 ?OT Time Calculation (min): 20 min ?Charges:  OT General Charges ?$OT Visit: 1 Visit ?OT Evaluation ?$OT Eval Moderate Complexity: 1 Mod ? ?Nestor Lewandowsky, OTR/L ?Acute Rehabilitation Services ?Pager: [phone] ?Office: [phone]  ?Malka So ?07/02/2021, 11:01 AM ?

## 2021-07-02 NOTE — Progress Notes (Signed)
[facility] PROGRESS NOTE   [name] ZHY:[id number] DOB: 1928/04/04 DOA: 06/29/2021  2 DOS: the patient was seen and examined on 07/02/2021  PCP: [place], [name], MD  Brief History and Hospital Course:  88 y.o. female with medical history significant of CHF, CHB s/p PPM, HLD, invasive ductal carcinoma of the breast, thyroid disease, venous insufficiency with venous ulcer, CKD stage III, hx of CVA in 2020/2021, admitted for suspected stroke in January 2023 and was started on Eliquis at that time, who presented to ED with bilateral leg weakness. She states the redness in her legs has gotten much worse. Also has increased pain and pain with standing as skin breakdown on bottom of feet.  Patient was started to have lower extremity cellulitis in the setting of chronic venous disease.  Hospitalized for further management.  Noted to be hypotensive on midodrine.  Developed urinary retention requiring Foley catheter placement.  Consultants: None  Procedures: None yet    Subjective: Patient feels well.  Denies any lightheadedness.  Did experience some pain in her legs overnight but better controlled now.  No chest pain shortness of breath.  No nausea vomiting.     Assessment/Plan:   * Cellulitis in setting of chronic venous stasis  .Patient has chronic venous disease and did develop worsening erythema in both legs with increased drainage.  Thought to have bilateral lower extremity cellulitis.  Started on broad-spectrum antibiotics with vancomycin and cefepime.   Patient was never febrile.  WBC was normal.  Procalcitonin less than 0.1.  Vancomycin and cefepime initiated to cephalexin.  Cultures have been negative so far. Seen by wound care nurse. X-ray of the feet did not show any abnormalities.  Acute renal failure superimposed on stage 3a chronic kidney disease (HCC) Baseline creatinine of 1.1-1.2.  Has a history of renal cell carcinoma with right nephrectomy. Apparently had not  been eating or drinking anything since symptom onset.  Noted to be quite dehydrated at admission.  Creatinine was 1.78.  Was given IV fluids.   Renal function has been gradually improving.  Experiencing urinary retention which is also contributing. Avoid nephrotoxic agents.  Monitor urine output.    Candidal intertrigo Started on clotrimazole BID and nystatin powder BID   Acute urinary retention Foley catheter had to be placed after multiple in and out catheterizations.  This is likely due to immobilization.   Due to low blood pressure cannot use Flomax or bethanechol.  Plus she has a history of glaucoma. Voiding trial once she is more ambulatory.  (HFpEF) heart failure with preserved ejection fraction (HCC) Echocardiogram from January showed EF of 55 to 60%.  Lasix is on hold.   Cortisol level was normal.  She is on no blood pressure lowering agents.  No evidence for overwhelming sepsis or any blood loss or hypovolemia. Continue midodrine at current dose.  Blood pressure low but stable.  Asymptomatic.  Orthostatic hypotension See above under heart failure. Continue midodrine.  Stroke aborted by administration of thrombolytic agent ([place]) - R MCA s/p tPA and mechanical thrombectomy, unk embolic source History of CVA in 2020 and 2021.  Admitted for suspected stroke in January 2023.  She was started on Eliquis which is being continued.  Does not take statin due to diarrhea.  Hypothyroidism Continue levothyroxine.  TSH is 3.5  Complete heart block s/p PPM  Status post pacemaker in 2009.  Revision of pacemaker in 2016.    Counseling regarding goals of care Palliative care is following.  They plan to meet with patient and power of attorney this afternoon.    Obesity Estimated body mass index is 32.73 kg/m as calculated from the following:   Height as of this encounter: '5\' 7"'$  (1.702 m).   Weight as of this encounter: 94.8 kg.   DVT Prophylaxis: On Eliquis Code Status: Full  code Family Communication: Discussed with patient.   Disposition Plan: Skilled nursing facility for rehab is anticipated.  Waiting on PT and OT evaluation.  Status is: Inpatient Remains inpatient appropriate because: Cellulitis of lower extremities, hypertension      Medications: Scheduled:  apixaban  2.5 mg Oral BID   cephALEXin  500 mg Oral Q6H   Chlorhexidine Gluconate Cloth  6 each Topical Daily   clotrimazole   Topical BID   feeding supplement  237 mL Oral BID BM   latanoprost  1 drop Both Eyes QHS   levothyroxine  50 mcg Oral QAC breakfast   midodrine  10 mg Oral TID WC   nystatin   Topical BID   Continuous:   DVV:[id number] **OR** acetaminophen  Antibiotics: Anti-infectives (From admission, onward)    Start     Dose/Rate Route Frequency Ordered Stop   07/02/21 1200  cephALEXin (KEFLEX) capsule 500 mg        500 mg Oral Every 6 hours 07/02/21 1021     07/01/21 1000  vancomycin (VANCOREADY) IVPB 1250 mg/250 mL  Status:  Discontinued        1,250 mg 166.7 mL/hr over 90 Minutes Intravenous Every 48 hours 07/01/21 0857 07/02/21 1021   07/01/21 1000  ceFEPIme (MAXIPIME) 2 g in sodium chloride 0.9 % 100 mL IVPB  Status:  Discontinued        2 g 200 mL/hr over 30 Minutes Intravenous Every 12 hours 07/01/21 0900 07/02/21 1021   06/30/21 1600  ceFEPIme (MAXIPIME) 2 g in sodium chloride 0.9 % 100 mL IVPB  Status:  Discontinued        2 g 200 mL/hr over 30 Minutes Intravenous Every 24 hours 06/29/21 1627 07/01/21 0900   06/29/21 1620  vancomycin variable dose per unstable renal function (pharmacist dosing)  Status:  Discontinued         Does not apply See admin instructions 06/29/21 1620 07/01/21 0857   06/29/21 1530  ceFEPIme (MAXIPIME) 2 g in sodium chloride 0.9 % 100 mL IVPB        2 g 200 mL/hr over 30 Minutes Intravenous  Once 06/29/21 1516 06/29/21 1652   06/29/21 1530  vancomycin (VANCOREADY) IVPB 1250 mg/250 mL        1,250 mg 166.7 mL/hr over 90 Minutes  Intravenous  Once 06/29/21 1516 06/29/21 1903   06/29/21 1315  vancomycin (VANCOCIN) IVPB 1000 mg/200 mL premix  Status:  Discontinued        1,000 mg 200 mL/hr over 60 Minutes Intravenous  Once 06/29/21 1309 06/29/21 1437       Objective:  Vital Signs  Vitals:   07/01/21 2009 07/02/21 0018 07/02/21 0413 07/02/21 0757  BP: (!) 95/55 (!) 85/43 (!) 85/43 (!) 85/52  Pulse: 60 (!) 59 60 60  Resp: '20 20 18 19  '$ Temp: 98.6 F (37 C) 98.6 F (37 C) 98.3 F (36.8 C) 97.6 F (36.4 C)  TempSrc: Oral Oral Oral Oral  SpO2: 96% 94% 95% 98%  Weight:      Height:        Intake/Output Summary (Last 24 hours) at 07/02/2021 1025 Last data filed  at 07/02/2021 0413 Gross per 24 hour  Intake 450 ml  Output 450 ml  Net 0 ml    Filed Weights   06/29/21 1617  Weight: 94.8 kg    General appearance: Awake alert.  In no distress Resp: Clear to auscultation bilaterally.  Normal effort Cardio: S1-S2 is normal regular.  No S3-S4.  No rubs murmurs or bruit GI: Abdomen is soft.  Nontender nondistended.  Bowel sounds are present normal.  No masses organomegaly Extremities: Lower legs covered in dressing. Neurologic:   No focal neurological deficits.      Lab Results:  Data Reviewed: I have personally reviewed labs and imaging study reports  CBC: Recent Labs  Lab 06/29/21 1413 06/30/21 0054 07/01/21 0038 07/02/21 0110  WBC 8.9 8.3 6.8 9.2  NEUTROABS 7.2  --   --   --   HGB 13.3 12.1 11.3* 11.2*  HCT 41.3 35.8* 35.5* 35.5*  MCV 95.2 94.7 95.9 97.8  PLT 337 281 295 304     Basic Metabolic Panel: Recent Labs  Lab 06/29/21 1413 06/30/21 0054 07/01/21 0038 07/02/21 0110  NA 134* 134* 135 139  K 4.4 4.2 4.2 4.2  CL 101 105 108 113*  CO2 22 18* 20* 20*  GLUCOSE 95 85 88 89  BUN 32* 34* 28* 26*  CREATININE 1.78* 1.58* 1.34* 1.28*  CALCIUM 8.0* 7.5* 7.3* 7.3*     GFR: Estimated Creatinine Clearance: 33.8 mL/min (A) (by C-G formula based on SCr of 1.28 mg/dL (H)).  Liver  Function Tests: Recent Labs  Lab 06/29/21 1413  AST 26  ALT 22  ALKPHOS 131*  BILITOT 0.5  PROT 5.7*  ALBUMIN 2.3*      Coagulation Profile: Recent Labs  Lab 06/29/21 1413  INR 1.1      Thyroid Function Tests: Recent Labs    06/30/21 0054  TSH 3.559      Recent Results (from the past 240 hour(s))  Resp Panel by RT-PCR (Flu A&B, Covid) Nasopharyngeal Swab     Status: None   Collection Time: 06/29/21 12:29 PM   Specimen: Nasopharyngeal Swab; Nasopharyngeal(NP) swabs in vial transport medium  Result Value Ref Range Status   SARS Coronavirus 2 by RT PCR NEGATIVE NEGATIVE Final    Comment: (NOTE) SARS-CoV-2 target nucleic acids are NOT DETECTED.  The SARS-CoV-2 RNA is generally detectable in upper respiratory specimens during the acute phase of infection. The lowest concentration of SARS-CoV-2 viral copies this assay can detect is 138 copies/mL. A negative result does not preclude SARS-Cov-2 infection and should not be used as the sole basis for treatment or other patient management decisions. A negative result may occur with  improper specimen collection/handling, submission of specimen other than nasopharyngeal swab, presence of viral mutation(s) within the areas targeted by this assay, and inadequate number of viral copies(<138 copies/mL). A negative result must be combined with clinical observations, patient history, and epidemiological information. The expected result is Negative.  Fact Sheet for Patients:  EntrepreneurPulse.com.au  Fact Sheet for Healthcare Providers:  IncredibleEmployment.be  This test is no t yet approved or cleared by the [place] FDA and  has been authorized for detection and/or diagnosis of SARS-CoV-2 by FDA under an Emergency Use Authorization (EUA). This EUA will remain  in effect (meaning this test can be used) for the duration of the COVID-19 declaration under Section 564(b)(1) of the  Act, 21 U.S.C.section 360bbb-3(b)(1), unless the authorization is terminated  or revoked sooner.       Influenza  A by PCR NEGATIVE NEGATIVE Final   Influenza B by PCR NEGATIVE NEGATIVE Final    Comment: (NOTE) The Xpert Xpress SARS-CoV-2/FLU/RSV plus assay is intended as an aid in the diagnosis of influenza from Nasopharyngeal swab specimens and should not be used as a sole basis for treatment. Nasal washings and aspirates are unacceptable for Xpert Xpress SARS-CoV-2/FLU/RSV testing.  Fact Sheet for Patients: EntrepreneurPulse.com.au  Fact Sheet for Healthcare Providers: IncredibleEmployment.be  This test is not yet approved or cleared by the [place] FDA and has been authorized for detection and/or diagnosis of SARS-CoV-2 by FDA under an Emergency Use Authorization (EUA). This EUA will remain in effect (meaning this test can be used) for the duration of the COVID-19 declaration under Section 564(b)(1) of the Act, 21 U.S.C. section 360bbb-3(b)(1), unless the authorization is terminated or revoked.  Performed at [facility], [place] [address] [place], [place] [address]   Blood Culture (routine x 2)     Status: None (Preliminary result)   Collection Time: 06/29/21  2:00 PM   Specimen: BLOOD  Result Value Ref Range Status   Specimen Description BLOOD BLOOD RIGHT HAND  Final   Special Requests   Final    BOTTLES DRAWN AEROBIC AND ANAEROBIC Blood Culture results may not be optimal due to an inadequate volume of blood received in culture bottles   Culture   Final    NO GROWTH 3 DAYS Performed at [facility], [place] [address] [place], [place] [address]    Report Status PENDING  Incomplete  Blood Culture (routine x 2)     Status: None (Preliminary result)   Collection Time: 06/29/21  2:49 PM   Specimen: BLOOD  Result Value Ref Range Status   Specimen Description BLOOD SITE NOT SPECIFIED  Final   Special Requests   Final     BOTTLES DRAWN AEROBIC AND ANAEROBIC Blood Culture adequate volume   Culture   Final    NO GROWTH 3 DAYS Performed at [facility], [address], [place], [place] [address]    Report Status PENDING  Incomplete  Urine Culture     Status: None   Collection Time: 06/30/21  2:52 PM   Specimen: In/Out Cath Urine  Result Value Ref Range Status   Specimen Description IN/OUT CATH URINE  Final   Special Requests NONE  Final   Culture   Final    NO GROWTH Performed at [facility], [place] [address], [place], [place] [address]    Report Status 07/01/2021 FINAL  Final  MRSA Next Gen by PCR, Nasal     Status: None   Collection Time: 07/02/21  7:38 AM   Specimen: Nasal Mucosa; Nasal Swab  Result Value Ref Range Status   MRSA by PCR Next Gen NOT DETECTED NOT DETECTED Final    Comment: (NOTE) The GeneXpert MRSA Assay (FDA approved for NASAL specimens only), is one component of a comprehensive MRSA colonization surveillance program. It is not intended to diagnose MRSA infection nor to guide or monitor treatment for MRSA infections. Test performance is not FDA approved in patients less than 63 years old. Performed at [facility], [place] [address] [place], [place] [address]        Radiology Studies: No results found.     LOS: 2 days   [facility] Pager on [email or web address]  07/02/2021, 10:25 AM

## 2021-07-02 NOTE — Progress Notes (Signed)
Initial Nutrition Assessment ? ?DOCUMENTATION CODES:  ? ?Not applicable ? ?INTERVENTION:  ?Continue Ensure Enlive po BID, each supplement provides 350 kcal and 20 grams of protein. ? ?MVI with minerals daily  ? ?-1 packet Juven BID, each packet provides 95 calories, 2.5 grams of protein (collagen), and 9.8 grams of carbohydrate (3 grams sugar); also contains 7 grams of L-arginine and L-glutamine, 300 mg vitamin C, 15 mg vitamin E, 1.2 mcg vitamin B-12, 9.5 mg zinc, 200 mg calcium, and 1.5 g  Calcium Beta-hydroxy-Beta-methylbutyrate to support wound healing ? ? ?NUTRITION DIAGNOSIS:  ? ?Increased nutrient needs related to wound healing (venous ulcer) as evidenced by estimated needs. ? ? ?GOAL:  ? ?Patient will meet greater than or equal to 90% of their needs ? ? ?MONITOR:  ? ?PO intake, Supplement acceptance, Labs, Weight trends ? ?REASON FOR ASSESSMENT:  ? ?Malnutrition Screening Tool ?  ? ?ASSESSMENT:  ? ? Pt is a 88 year old female with past medical history significant of congestive heart failure, HLD, CHB s/p PPM, invasive breast CA, renal carcinoma, s/p nephrorectomy, thyroid disease, venous insufficiency with venous ulcer, CKD stage III, CVA who presented to the ED with bilateral leg weakness and was admitted for venous ulcer.  ? ?Pt is currently on a regular diet and meal completions of 85-100% for breakfast and lunch on 06/30/21.  ? ?Met with pt at bedside. Per pt, pt endorses a good appetite today and no complaints of nausea, vomiting or constipation at this time. Pt reports that she ate a chicken noodle and potato soup last night while in the hospital and breakfast this AM. Pt complimenting the food here and states that the food that she has been eating here is delicious. Pt reports that she typically has a good appetite at home and eats 2-3 meals a day. For breakfast, pt reports that she typically eats a boiled egg, one piece of toast and some type of sweet cake to go along with it. For lunch, pt reports  that she typically does not eat lunch and usually eats an earlier dinner that consists of "whatever is available at the cafeteria." When asking pt what cafeteria she was referring to, she stated [employer] and some other type of cafe type restaurant in [place]. Pt reports that she likes the chicken and dumplings from these places and that one dinner is two days worth of meals for her as she states that she eats half of it one night and the other half, the second night. Per pt, she lives alone and uses a cane and/or walker at baseline. Per pt, pt unsure of any weight loss and her usual body weight.  ? ?Discussed with pt the importance of adequate PO intake and protein intake to maintain energy/strength and for wound healing. Discussed oral nutrition supplementation with pt and pt agreeable to continuing to receive Ensure (vanilla-flavored) and Juven due to good PO intake currently. Pt also agreeable to taking a MVI.  ? ?Current wt: 94.8 kg ?Pt's weight on 05/03/21 was 84.7 kg. Suspect weight trending upward is due to fluid accumulation.  ? ?Medications reviewed and include: Ensure Enlive BID, miralax, senokot-s  ? ?Labs reviewed and include:  ?Corrected calcium: 8.7 (low)  ?BUN: 26 (high)  ?Creatinine: 1.28 (high)  ?Hemoglobin: 11.2 (low)  ? ? ? ?NUTRITION - FOCUSED PHYSICAL EXAM: ? ?Flowsheet Row Most Recent Value  ?Orbital Region Mild depletion  ?Upper Arm Region No depletion  ?Thoracic and Lumbar Region No depletion  ?  Buccal Region No depletion  ?Temple Region Mild depletion  ?Clavicle Bone Region No depletion  ?Clavicle and Acromion Bone Region No depletion  ?Scapular Bone Region Mild depletion  ?Dorsal Hand Mild depletion  ?Patellar Region No depletion  ?Anterior Thigh Region No depletion  ?Posterior Calf Region Unable to assess  [wound dressings wrapped around pt's calf]  ?Edema (RD Assessment) None  ?Hair Reviewed  ?Eyes Reviewed  ?Mouth Reviewed  ?Skin Reviewed  ?Nails Reviewed  ? ?   ? ? ?Diet Order:   ?Diet Order   ? ?       ?  Diet regular Room service appropriate? Yes; Fluid consistency: Thin  Diet effective now       ?  ? ?  ?  ? ?  ? ? ?EDUCATION NEEDS:  ? ?Education needs have been addressed ? ?Skin:  Skin Assessment: Skin Integrity Issues: ?Skin Integrity Issues:: Incisions ?Incisions: wound (incision): toe,anterior,right; heel left; venous stasis ulcer pretibial left ? ?Last BM:  3/5 ? ?Height:  ? ?Ht Readings from Last 1 Encounters:  ?06/29/21 5' 7" (1.702 m)  ? ? ?Weight:  ? ?Wt Readings from Last 1 Encounters:  ?06/29/21 94.8 kg  ? ? ?Ideal Body Weight:  61.4 kg ? ?BMI:  Body mass index is 32.73 kg/m?. ? ?Estimated Nutritional Needs:  ? ?Kcal:  1650 - 1850 ? ?Protein:  80 - 95 grams ? ?Fluid:  >/= 1.6 L ? ? ? ? , Dietetic Intern ?07/02/2021 5:25 PM ?

## 2021-07-02 NOTE — TOC Progression Note (Addendum)
Transition of Care (TOC) - Progression Note  ? ? ?Patient Details  ?Name: [name] ?MRN: [id number] ?Date of Birth: 26-Jun-1927 ? ?Transition of Care (TOC) CM/SW Contact  ?Emeterio Reeve, LCSW ?Phone Number: ?07/02/2021, 3:32 PM ? ?Clinical Narrative:    ? ?CSW gave bed offers to pt. CSW informed pt she is also in co-pay days since she was recently at [facility] but her Lynda Rainwater may cover co-pay days.  ? ?Pt requested CSW call POA [name]. [name] called CSW back, CSW gave bed offers. [name] stated he will review and call CSW back.  ? ?CSW spoke to pts last SNF she was there almost 100 days. Pt either has used or is close to using all of her days. Pt will need to have 60 wellness days before insurance auth can be renewed.  ? ?Expected Discharge Plan: [place] ?Barriers to Discharge: Continued Medical Work up ? ?Expected Discharge Plan and Services ?Expected Discharge Plan: [place] ?  ?  ?  ?Living arrangements for the past 2 months: [place] ?                ?  ?  ?  ?  ?  ?  ?  ?  ?  ?  ? ? ?Social Determinants of Health (SDOH) Interventions ?  ? ?Readmission Risk Interventions ?No flowsheet data found. ? ?Emeterio Reeve, LCSW ?Clinical Social Worker ? ?

## 2021-07-03 LAB — CBC
HCT: 34.8 % — ABNORMAL LOW (ref 36.0–46.0)
Hemoglobin: 11.3 g/dL — ABNORMAL LOW (ref 12.0–15.0)
MCH: 31.2 pg (ref 26.0–34.0)
MCHC: 32.5 g/dL (ref 30.0–36.0)
MCV: 96.1 fL (ref 80.0–100.0)
Platelets: 292 10*3/uL (ref 150–400)
RBC: 3.62 MIL/uL — ABNORMAL LOW (ref 3.87–5.11)
RDW: 14.5 % (ref 11.5–15.5)
WBC: 8 10*3/uL (ref 4.0–10.5)
nRBC: 0 % (ref 0.0–0.2)

## 2021-07-03 LAB — BASIC METABOLIC PANEL
Anion gap: 5 (ref 5–15)
BUN: 29 mg/dL — ABNORMAL HIGH (ref 8–23)
CO2: 21 mmol/L — ABNORMAL LOW (ref 22–32)
Calcium: 7.6 mg/dL — ABNORMAL LOW (ref 8.9–10.3)
Chloride: 110 mmol/L (ref 98–111)
Creatinine, Ser: 1.21 mg/dL — ABNORMAL HIGH (ref 0.44–1.00)
GFR, Estimated: 42 mL/min — ABNORMAL LOW (ref >60)
Glucose, Bld: 98 mg/dL (ref 70–99)
Potassium: 4.2 mmol/L (ref 3.5–5.1)
Sodium: 136 mmol/L (ref 135–145)

## 2021-07-03 LAB — PROCALCITONIN: Procalcitonin: 0.11 ng/mL

## 2021-07-03 MED ORDER — MIDODRINE HCL 10 MG PO TABS: 10.0000 mg | ORAL_TABLET | Freq: Three times a day (TID) | ORAL | 2 refills | Status: AC

## 2021-07-03 MED ORDER — POLYETHYLENE GLYCOL 3350 17 G PO PACK: 17.0000 g | PACK | Freq: Every day | ORAL | 0 refills | Status: AC

## 2021-07-03 MED ORDER — APIXABAN 5 MG PO TABS: 5.0000 mg | ORAL_TABLET | Freq: Two times a day (BID) | ORAL | 2 refills | Status: AC

## 2021-07-03 MED ORDER — NYSTATIN 100000 UNIT/GM EX POWD: Freq: Two times a day (BID) | CUTANEOUS | 0 refills | Status: AC

## 2021-07-03 MED ORDER — CEPHALEXIN 500 MG PO CAPS: 500.0000 mg | ORAL_CAPSULE | Freq: Four times a day (QID) | ORAL | 0 refills | Status: AC

## 2021-07-03 MED ORDER — ADULT MULTIVITAMIN W/MINERALS CH: 1.0000 | ORAL_TABLET | Freq: Every day | ORAL | 2 refills | Status: AC

## 2021-07-03 MED ORDER — LEVOTHYROXINE SODIUM 50 MCG PO TABS: 50.0000 ug | ORAL_TABLET | Freq: Every day | ORAL | 2 refills | Status: DC

## 2021-07-03 MED ORDER — SENNOSIDES-DOCUSATE SODIUM 8.6-50 MG PO TABS: 2.0000 | ORAL_TABLET | Freq: Every day | ORAL | 1 refills | Status: AC

## 2021-07-03 MED ORDER — ACETAMINOPHEN 325 MG PO TABS
650.0000 mg | ORAL_TABLET | Freq: Four times a day (QID) | ORAL | Status: AC | PRN
Start: 2021-07-03 — End: ?

## 2021-07-03 MED ORDER — ENSURE ENLIVE PO LIQD: 237.0000 mL | Freq: Two times a day (BID) | ORAL | 12 refills | Status: AC

## 2021-07-03 NOTE — TOC Transition Note (Signed)
Transition of Care (TOC) - CM/SW Discharge Note ? ? ?Patient Details  ?Name: [name] ?MRN: [id number] ?Date of Birth: 27-Oct-1927 ? ?Transition of Care (TOC) CM/SW Contact:  ?Emeterio Reeve, LCSW ?Phone Number: ?07/03/2021, 12:25 PM ? ? ?Clinical Narrative:    ?  ?Per MD patient ready for DC to [facility] rehab. RN, patient, patient's family, and facility notified of DC. Discharge Summary and FL2 sent to facility. DC packet on chart. . Ambulance transport requested for patient.  ?  ?RN to call report to [phone]. ? ?CSW will sign off for now as social work intervention is no longer needed. Please consult [place] again if new needs arise. ? ? ?Final next level of care: [place] ?Barriers to Discharge: Barriers Resolved ? ? ?Patient Goals and CMS Choice ?Patient states their goals for this hospitalization and ongoing recovery are:: SNF ?CMS Medicare.gov Compare Post Acute Care list provided to:: Patient ?Choice offered to / list presented to : Patient ? ?Discharge Placement ?  ?           ?Patient chooses bed at: [facility] ?Patient to be transferred to facility by: ptar ?Name of family member notified: [name], [place] ?Patient and family notified of of transfer: 07/03/21 ? ?Discharge Plan and Services ?  ?  ?           ?  ?  ?  ?  ?  ?  ?  ?  ?  ?  ? ?Social Determinants of Health (SDOH) Interventions ?  ? ? ?Readmission Risk Interventions ?No flowsheet data found. ? ? ? ? ?

## 2021-07-03 NOTE — Progress Notes (Signed)
Report called to transfer facility to nurse [place]. Waiting for [facility] to transport patient.  Patients dressing changed, IV removed.  ?

## 2021-07-03 NOTE — Progress Notes (Unsigned)
PATIENT: [name] DOB: [birth year]  REASON FOR VISIT: follow up HISTORY FROM: patient PRIMARY NEUROLOGIST:   HISTORY OF PRESENT ILLNESS: Today 07/03/21  HISTORY   REVIEW OF SYSTEMS: Out of a complete 14 system review of symptoms, the patient complains only of the following symptoms, and all other reviewed systems are negative.  ALLERGIES: Allergies  Allergen Reactions   Amoxicillin-Pot Clavulanate Diarrhea and Nausea And Vomiting    Has patient had a PCN reaction causing immediate rash, facial/tongue/throat swelling, SOB or lightheadedness with hypotension: Yes Has patient had a PCN reaction causing severe rash involving mucus membranes or skin necrosis: No Has patient had a PCN reaction that required hospitalization: No Has patient had a PCN reaction occurring within the last 10 years: Yes If all of the above answers are "NO", then may proceed with Cephalosporin use.    Erythromycin Hives   Tape Other (See Comments)    BAND AIDS-SKIN IRRITATION   Codeine Nausea And Vomiting   Doxycycline Diarrhea   Flagyl [Metronidazole] Hives   Macrobid [Nitrofurantoin Macrocrystal] Nausea And Vomiting   Protonix [Pantoprazole]     Other reaction(s): diarrhea   Tolterodine Nausea And Vomiting   Ciprofloxacin Hives and Rash    HOME MEDICATIONS: Facility-Administered Medications Prior to Visit  Medication Dose Route Frequency Provider Last Rate Last Admin   acetaminophen (TYLENOL) tablet 650 mg  650 mg Oral Q6H PRN Orma Flaming, MD       Or   acetaminophen (TYLENOL) suppository 650 mg  650 mg Rectal Q6H PRN Orma Flaming, MD       apixaban Arne Cleveland) tablet 5 mg  5 mg Oral BID [name], MD   5 mg at 07/03/21 0939   cephALEXin (KEFLEX) capsule 500 mg  500 mg Oral Q6H [name], MD   500 mg at 07/03/21 1320   Chlorhexidine Gluconate Cloth 2 % PADS 6 each  6 each Topical Daily [name], MD   6 each at 07/03/21 1000   clotrimazole (LOTRIMIN) 1 % cream    Topical BID Orma Flaming, MD   Given at 07/03/21 1000   feeding supplement (ENSURE ENLIVE / ENSURE PLUS) liquid 237 mL  237 mL Oral BID BM [name], MD   237 mL at 07/03/21 1000   latanoprost (XALATAN) 0.005 % ophthalmic solution 1 drop  1 drop Both Eyes QHS Orma Flaming, MD   1 drop at 07/01/21 2145   levothyroxine (SYNTHROID) tablet 50 mcg  50 mcg Oral QAC breakfast Orma Flaming, MD   50 mcg at 07/03/21 0554   midodrine (PROAMATINE) tablet 10 mg  10 mg Oral TID WC [name], MD   10 mg at 07/03/21 1320   multivitamin with minerals tablet 1 tablet  1 tablet Oral Daily [name], MD   1 tablet at 07/03/21 6720   nystatin (MYCOSTATIN/NYSTOP) topical powder   Topical BID Orma Flaming, MD   Given at 07/03/21 1000   polyethylene glycol (MIRALAX / GLYCOLAX) packet 17 g  17 g Oral Daily [name], MD   17 g at 07/03/21 0940   senna-docusate (Senokot-S) tablet 2 tablet  2 tablet Oral QHS [name], MD   2 tablet at 07/02/21 2045   Outpatient Medications Prior to Visit  Medication Sig Dispense Refill   acetaminophen (TYLENOL) 325 MG tablet Take 2 tablets (650 mg total) by mouth every 6 (six) hours as needed for mild pain (or Fever >/= 101).     aluminum sulfate-calcium acetate (DOMEBORO) packet  Apply 1 packet topically daily as needed (Mixed with water,lukewarm and apply compressions on affected area.). (Patient taking differently: Apply 1 packet topically every evening.) 100 each 0   apixaban (ELIQUIS) 5 MG TABS tablet Take 1 tablet (5 mg total) by mouth 2 (two) times daily. 60 tablet 2   Calcium Carb-Cholecalciferol 600-5 MG-MCG TABS      cephALEXin (KEFLEX) 500 MG capsule Take 1 capsule (500 mg total) by mouth every 6 (six) hours for 7 days. 28 capsule 0   feeding supplement (ENSURE ENLIVE / ENSURE PLUS) LIQD Take 237 mLs by mouth 2 (two) times daily between meals. 237 mL 12   furosemide (LASIX) 20 MG tablet TAKE 1/2 TABLET DAILY (Patient taking differently: Take  10 mg by mouth daily.) 45 tablet 1   latanoprost (XALATAN) 0.005 % ophthalmic solution Place 1 drop into both eyes at bedtime. 2.5 mL 10   levothyroxine (SYNTHROID) 50 MCG tablet Take 1 tablet (50 mcg total) by mouth daily before breakfast. 30 tablet 2   midodrine (PROAMATINE) 10 MG tablet Take 1 tablet (10 mg total) by mouth 3 (three) times daily with meals. 90 tablet 2   midodrine (PROAMATINE) 2.5 MG tablet Take 1 tablet (2.5 mg total) by mouth daily. TAKE 1 TABLET DAILY AFTER  BREAKFAST (Patient not taking: Reported on 06/29/2021) 90 tablet 2   Multiple Vitamin (MULTIVITAMIN WITH MINERALS) TABS tablet Take 1 tablet by mouth daily. 30 tablet 2   nystatin (MYCOSTATIN/NYSTOP) powder Apply topically 2 (two) times daily. APPLY TO PANNUS/GROIN 15 g 0   polyethylene glycol (MIRALAX / GLYCOLAX) 17 g packet Take 17 g by mouth daily. 14 each 0   pravastatin (PRAVACHOL) 20 MG tablet Take 1 tablet (20 mg total) by mouth daily at 6 PM. (Patient not taking: Reported on 06/29/2021) 30 tablet 1   Probiotic Product (ALIGN PO) Take 1 capsule by mouth daily.     senna-docusate (SENOKOT-S) 8.6-50 MG tablet Take 2 tablets by mouth at bedtime. 60 tablet 1   traMADol (ULTRAM) 50 MG tablet Take 1 tablet (50 mg total) by mouth every 8 (eight) hours as needed for up to 10 doses for severe pain. (Patient not taking: Reported on 06/29/2021) 10 tablet 0    PAST MEDICAL HISTORY: Past Medical History:  Diagnosis Date   Cataract    Chronic congestive heart failure, unspecified heart failure type ([place]) 09/19/2019   Complete heart block (H[facility])    s/p PPM implant (MDT) by Dr Blanch Media.  Atrial lead could not be paced at time of the procedure.  She has chronic AV dysociation   Delusional disorder ([place])    Exogenous obesity    Hypercholesterolemia    Invasive ductal carcinoma of breast ([place]) 03/2007   BILATERAL BREASTS   Orthostatic hypotension    treated with midodrine by Dr Rollene [name]   PONV (postoperative nausea and vomiting)     Thyroid disease    Venous insufficiency     PAST SURGICAL HISTORY: Past Surgical History:  Procedure Laterality Date   ABDOMINAL HYSTERECTOMY     BREAST LUMPECTOMY Bilateral 2009   CATARACT EXTRACTION     EYE SURGERY X 2   CHOLECYSTECTOMY     COLONOSCOPY N/A 02/16/2015   Procedure: COLONOSCOPY;  Surgeon: Wilford Corner, MD;  Location[facility] END[facility]COPY;  Service: Endoscopy;  Laterality: N/A;   EP IMPLANTABLE DEVICE N/A 02/23/2015   pacemaker generator change (MDT Sensia SR) by Dr Rayann Heman    ESOPHAGOGASTRODUODENOSCOPY N/A 02/16/2015   Procedure: ESOPHAGOGASTRODUODENOSCOPY (EGD);  Surgeon:  Wilford Corner, MD;  Location: [facility] ENDOSCOPY;  Service: Endoscopy;  Laterality: N/A;   IR CT HEAD LTD  09/21/2019   IR PERCUTANEOUS ART THROMBECTOMY/INFUSION INTRACRANIAL INC DIAG ANGIO  09/21/2019   PACEMAKER INSERTION     MDT implanted by Dr Blanch Media.  Atrial lead placement was unsuccessful.  She has chronic AV dysociation   RADIOLOGY WITH ANESTHESIA N/A 09/21/2019   Procedure: IR WITH ANESTHESIA CODE STROKE;  Surgeon: Radiologist, Medication, MD;  Location: [facility];  Service: Radiology;  Laterality: N/A;   remote right radical nephrectomy  2009    FAMILY HISTORY: Family History  Problem Relation Age of Onset   Heart disease Maternal Grandmother    Heart disease Mother     SOCIAL HISTORY: Social History   Socioeconomic History   Marital status: Widowed    Spouse name: Not on file   Number of children: Not on file   Years of education: Not on file   Highest education level: Not on file  Occupational History   Not on file  Tobacco Use   Smoking status: Never   Smokeless tobacco: Never  Vaping Use   Vaping Use: Never used  Substance and Sexual Activity   Alcohol use: No    Alcohol/week: 0.0 standard drinks   Drug use: No   Sexual activity: Not Currently  Other Topics Concern   Not on file  Social History Narrative   Not on file   Social Determinants of Health   Financial Resource  Strain: Low Risk    Difficulty of Paying Living Expenses: Not hard at all  Food Insecurity: No Food Insecurity   Worried About Charity fundraiser in the Last Year: Never true   [place] in the Last Year: Never true  Transportation Needs: No Transportation Needs   Lack of Transportation (Medical): No   Lack of Transportation (Non-Medical): No  Physical Activity: Inactive   Days of Exercise per Week: 0 days   Minutes of Exercise per Session: 0 min  Stress: No Stress Concern Present   Feeling of Stress : Not at all  Social Connections: Moderately Isolated   Frequency of Communication with Friends and Family: Twice a week   Frequency of Social Gatherings with Friends and Family: Twice a week   Attends Religious Services: More than 4 times per year   Active Member of [employer] or Organizations: No   Attends Archivist Meetings: Never   Marital Status: Widowed  Human resources officer Violence: Not At Risk   Fear of Current or Ex-Partner: No   Emotionally Abused: No   Physically Abused: No   Sexually Abused: No      PHYSICAL EXAM  There were no vitals filed for this visit. There is no height or weight on file to calculate BMI.  Generalized: Well developed, in no acute distress   Neurological examination  Mentation: Alert oriented to time, place, history taking. Follows all commands speech and language fluent Cranial nerve II-XII: Pupils were equal round reactive to light. Extraocular movements were full, visual field were full on confrontational test. Facial sensation and strength were normal. Uvula tongue midline. Head turning and shoulder shrug  were normal and symmetric. Motor: The motor testing reveals 5 over 5 strength of all 4 extremities. Good symmetric motor tone is noted throughout.  Sensory: Sensory testing is intact to soft touch on all 4 extremities. No evidence of extinction is noted.  Coordination: Cerebellar testing reveals good finger-nose-finger and  heel-to-shin bilaterally.  Gait and station: Gait is normal. Tandem gait is normal. Romberg is negative. No drift is seen.  Reflexes: Deep tendon reflexes are symmetric and normal bilaterally.   DIAGNOSTIC DATA (LABS, IMAGING, TESTING) - I reviewed patient records, labs, notes, testing and imaging myself where available.  Lab Results  Component Value Date   WBC 8.0 07/03/2021   HGB 11.3 (L) 07/03/2021   HCT 34.8 (L) 07/03/2021   MCV 96.1 07/03/2021   PLT 292 07/03/2021      Component Value Date/Time   NA 136 07/03/2021 0129   NA 144 01/28/2020 0935   NA 143 09/19/2016 1002   K 4.2 07/03/2021 0129   K 3.5 09/19/2016 1002   CL 110 07/03/2021 0129   CL 106 03/03/2012 0831   CO2 21 (L) 07/03/2021 0129   CO2 27 09/19/2016 1002   GLUCOSE 98 07/03/2021 0129   GLUCOSE 85 09/19/2016 1002   GLUCOSE 124 (H) 03/03/2012 0831   BUN 29 (H) 07/03/2021 0129   BUN 17 01/28/2020 0935   BUN 18.2 09/19/2016 1002   CREATININE 1.21 (H) 07/03/2021 0129   CREATININE 1.13 (H) 01/09/2018 1550   CREATININE 1.2 (H) 09/19/2016 1002   CALCIUM 7.6 (L) 07/03/2021 0129   CALCIUM 10.0 09/19/2016 1002   PROT 5.7 (L) 06/29/2021 1413   PROT 6.7 01/28/2020 0935   PROT 7.2 09/19/2016 1002   ALBUMIN 2.3 (L) 06/29/2021 1413   ALBUMIN 4.2 01/28/2020 0935   ALBUMIN 3.6 09/19/2016 1002   AST 26 06/29/2021 1413   AST 13 09/19/2016 1002   ALT 22 06/29/2021 1413   ALT 12 09/19/2016 1002   ALKPHOS 131 (H) 06/29/2021 1413   ALKPHOS 110 09/19/2016 1002   BILITOT 0.5 06/29/2021 1413   BILITOT 0.5 01/28/2020 0935   BILITOT 0.41 09/19/2016 1002   GFRNONAA 42 (L) 07/03/2021 0129   GFRAA 47 (L) 01/28/2020 0935   Lab Results  Component Value Date   CHOL 126 05/04/2021   HDL 46 05/04/2021   LDLCALC 71 05/04/2021   TRIG 46 05/04/2021   CHOLHDL 2.7 05/04/2021   Lab Results  Component Value Date   HGBA1C 5.4 05/04/2021   No results found for: VITAMINB12 Lab Results  Component Value Date   TSH 3.559  06/30/2021      ASSESSMENT AND PLAN 88 y.o. year old female  has a past medical history of Cataract, Chronic congestive heart failure, unspecified heart failure type ([place]) (09/19/2019), Complete heart block ([place]), Delusional disorder ([place]), Exogenous obesity, Hypercholesterolemia, Invasive ductal carcinoma of breast ([place]) (03/2007), Orthostatic hypotension, PONV (postoperative nausea and vomiting), Thyroid disease, and Venous insufficiency. here with ***     Ward Givens, MSN, NP-C 07/03/2021, 4:30 PM [facility] [address] [place], [place] [address] [phone]

## 2021-07-03 NOTE — Plan of Care (Signed)

## 2021-07-03 NOTE — Care Management Important Message (Signed)
Important Message ? ?Patient Details  ?Name: [name] ?MRN: [id number] ?Date of Birth: 06/22/1927 ? ? ?Medicare Important Message Given:  Yes ? ? ? ? ?Levada Dy  Stepfon Rawles-Martin ?07/03/2021, 12:08 PM ?

## 2021-07-03 NOTE — Discharge Summary (Signed)
[facility]  Physician Discharge Summary   Patient ID: [name] MRN: [id number] DOB/AGE: 08/08/1927 88 y.o.  Admit date: 06/29/2021 Discharge date:   07/03/2021   PCP: [place], [name], MD  DISCHARGE DIAGNOSES:  Principal Problem:   Cellulitis in setting of chronic venous stasis  Active Problems:   Venous stasis ulcer of left lower extremity (HCC)   Acute renal failure superimposed on stage 3a chronic kidney disease (HCC)   Candidal intertrigo   (HFpEF) heart failure with preserved ejection fraction (HCC)   Acute urinary retention   Orthostatic hypotension   Complete heart block s/p PPM    Hypothyroidism   Stroke aborted by administration of thrombolytic agent ([place]) - R MCA s/p tPA and mechanical thrombectomy, unk embolic source   Counseling regarding goals of care   RECOMMENDATIONS FOR OUTPATIENT FOLLOW UP: Palliative care to continue to follow at SNF Check basic metabolic panel in 1 week Voiding trial once she is more ambulatory   Home Health: Going to SNF Equipment/Devices: None  CODE STATUS: DNR  DISCHARGE CONDITION: fair  Diet recommendation: Heart healthy  INITIAL HISTORY: 88 y.o. female with medical history significant of CHF, CHB s/p PPM, HLD, invasive ductal carcinoma of the breast, thyroid disease, venous insufficiency with venous ulcer, CKD stage III, hx of CVA in 2020/2021, admitted for suspected stroke in January 2023 and was started on Eliquis at that time, who presented to ED with bilateral leg weakness. She states the redness in her legs has gotten much worse. Also has increased pain and pain with standing as skin breakdown on bottom of feet.  Patient was started to have lower extremity cellulitis in the setting of chronic venous disease.  Hospitalized for further management.  Noted to be hypotensive on midodrine.  Developed urinary retention requiring Foley catheter placement.  Consultations: Palliative  Care  Procedures: None  HOSPITAL COURSE:   * Cellulitis in setting of chronic venous stasis  Patient has chronic venous disease and did develop worsening erythema in both legs with increased drainage.  Thought to have bilateral lower extremity cellulitis.  Started on broad-spectrum antibiotics with vancomycin and cefepime.   Patient was never febrile.  WBC was normal.  Procalcitonin less than 0.1.  Vancomycin and cefepime transitioned to cephalexin.  Patient with numerous antibiotic allergies.  Cultures have been negative so far. Seen by wound care nurse. X-ray of the feet did not show any abnormalities. We will do cephalexin for 7 more days.  Acute renal failure superimposed on stage 3a chronic kidney disease (HCC) Baseline creatinine of 1.1-1.2.  Has a history of renal cell carcinoma with right nephrectomy. Apparently had not been eating or drinking anything since symptom onset.  Noted to be quite dehydrated at admission.  Creatinine was 1.78.  Was given IV fluids.   She also experienced urinary retention.  Renal function gradually improving.  Avoid nephrotoxic agents.  Candidal intertrigo Started on nystatin powder   Acute urinary retention Foley catheter had to be placed after multiple in and out catheterizations.  This is likely due to immobilization.   Due to low blood pressure we cannot use Flomax or bethanechol.  Plus she has a history of glaucoma. Voiding trial once she is more ambulatory.  (HFpEF) heart failure with preserved ejection fraction (HCC) Chronic hypotension  Echocardiogram from January showed EF of 55 to 60%.  Lasix is on hold.    Cortisol level was normal.  She is on no blood pressure lowering agents.  No evidence for overwhelming  sepsis or any blood loss or hypovolemia. Continue midodrine at current dose.  Blood pressure low but stable.  Asymptomatic.  Orthostatic hypotension See above under heart failure. Continue midodrine.  Stroke aborted by  administration of thrombolytic agent ([place]) - R MCA s/p tPA and mechanical thrombectomy, unk embolic source History of CVA in 2020 and 2021.  Admitted for suspected stroke in January 2023.  She was started on Eliquis which is being continued.  Does not take statin due to diarrhea.  Hypothyroidism Continue levothyroxine.  TSH is 3.5  Complete heart block s/p PPM  Status post pacemaker in 2009.  Revision of pacemaker in 2016.    Counseling regarding goals of care Seen by palliative care.  Changed to DNR   Obesity Estimated body mass index is 32.73 kg/m as calculated from the following:   Height as of this encounter: '5\' 7"'$  (1.702 m).   Weight as of this encounter: 94.8 kg.  Patient is stable for discharge to skilled nursing facility for short-term rehab.  PERTINENT LABS:  The results of significant diagnostics from this hospitalization (including imaging, microbiology, ancillary and laboratory) are listed below for reference.    Microbiology: Recent Results (from the past 240 hour(s))  Resp Panel by RT-PCR (Flu A&B, Covid) Nasopharyngeal Swab     Status: None   Collection Time: 06/29/21 12:29 PM   Specimen: Nasopharyngeal Swab; Nasopharyngeal(NP) swabs in vial transport medium  Result Value Ref Range Status   SARS Coronavirus 2 by RT PCR NEGATIVE NEGATIVE Final    Comment: (NOTE) SARS-CoV-2 target nucleic acids are NOT DETECTED.  The SARS-CoV-2 RNA is generally detectable in upper respiratory specimens during the acute phase of infection. The lowest concentration of SARS-CoV-2 viral copies this assay can detect is 138 copies/mL. A negative result does not preclude SARS-Cov-2 infection and should not be used as the sole basis for treatment or other patient management decisions. A negative result may occur with  improper specimen collection/handling, submission of specimen other than nasopharyngeal swab, presence of viral mutation(s) within the areas targeted by this assay, and  inadequate number of viral copies(<138 copies/mL). A negative result must be combined with clinical observations, patient history, and epidemiological information. The expected result is Negative.  Fact Sheet for Patients:  EntrepreneurPulse.com.au  Fact Sheet for Healthcare Providers:  IncredibleEmployment.be  This test is no t yet approved or cleared by the [place] FDA and  has been authorized for detection and/or diagnosis of SARS-CoV-2 by FDA under an Emergency Use Authorization (EUA). This EUA will remain  in effect (meaning this test can be used) for the duration of the COVID-19 declaration under Section 564(b)(1) of the Act, 21 U.S.C.section 360bbb-3(b)(1), unless the authorization is terminated  or revoked sooner.       Influenza A by PCR NEGATIVE NEGATIVE Final   Influenza B by PCR NEGATIVE NEGATIVE Final    Comment: (NOTE) The Xpert Xpress SARS-CoV-2/FLU/RSV plus assay is intended as an aid in the diagnosis of influenza from Nasopharyngeal swab specimens and should not be used as a sole basis for treatment. Nasal washings and aspirates are unacceptable for Xpert Xpress SARS-CoV-2/FLU/RSV testing.  Fact Sheet for Patients: EntrepreneurPulse.com.au  Fact Sheet for Healthcare Providers: IncredibleEmployment.be  This test is not yet approved or cleared by the [place] FDA and has been authorized for detection and/or diagnosis of SARS-CoV-2 by FDA under an Emergency Use Authorization (EUA). This EUA will remain in effect (meaning this test can be used) for the duration of the COVID-19  declaration under Section 564(b)(1) of the Act, 21 U.S.C. section 360bbb-3(b)(1), unless the authorization is terminated or revoked.  Performed at [facility], [place] [address], [place], [place] [address]   Blood Culture (routine x 2)     Status: None (Preliminary result)   Collection Time: 06/29/21   2:00 PM   Specimen: BLOOD  Result Value Ref Range Status   Specimen Description BLOOD BLOOD RIGHT HAND  Final   Special Requests   Final    BOTTLES DRAWN AEROBIC AND ANAEROBIC Blood Culture results may not be optimal due to an inadequate volume of blood received in culture bottles   Culture   Final    NO GROWTH 3 DAYS Performed at [facility], [place] [address], [place], [place] [address]    Report Status PENDING  Incomplete  Blood Culture (routine x 2)     Status: None (Preliminary result)   Collection Time: 06/29/21  2:49 PM   Specimen: BLOOD  Result Value Ref Range Status   Specimen Description BLOOD SITE NOT SPECIFIED  Final   Special Requests   Final    BOTTLES DRAWN AEROBIC AND ANAEROBIC Blood Culture adequate volume   Culture   Final    NO GROWTH 3 DAYS Performed at [facility], [address], [place], [place] [address]    Report Status PENDING  Incomplete  Urine Culture     Status: None   Collection Time: 06/30/21  2:52 PM   Specimen: In/Out Cath Urine  Result Value Ref Range Status   Specimen Description IN/OUT CATH URINE  Final   Special Requests NONE  Final   Culture   Final    NO GROWTH Performed at [facility], [place] [address] [place], [place] [address]    Report Status 07/01/2021 FINAL  Final  MRSA Next Gen by PCR, Nasal     Status: None   Collection Time: 07/02/21  7:38 AM   Specimen: Nasal Mucosa; Nasal Swab  Result Value Ref Range Status   MRSA by PCR Next Gen NOT DETECTED NOT DETECTED Final    Comment: (NOTE) The GeneXpert MRSA Assay (FDA approved for NASAL specimens only), is one component of a comprehensive MRSA colonization surveillance program. It is not intended to diagnose MRSA infection nor to guide or monitor treatment for MRSA infections. Test performance is not FDA approved in patients less than 15 years old. Performed at [facility], [place] [address] [place], [place] [address]      Labs:  COVID-19 Labs   Lab  Results  Component Value Date   SARSCOV2NAA NEGATIVE 06/29/2021   SARSCOV2NAA POSITIVE (A) 05/03/2021   SARSCOV2NAA NEGATIVE 03/08/2021   SARSCOV2NAA POSITIVE (A) 09/21/2019      Basic Metabolic Panel: Recent Labs  Lab 06/29/21 1413 06/30/21 0054 07/01/21 0038 07/02/21 0110 07/03/21 0129  NA 134* 134* 135 139 136  K 4.4 4.2 4.2 4.2 4.2  CL 101 105 108 113* 110  CO2 22 18* 20* 20* 21*  GLUCOSE 95 85 88 89 98  BUN 32* 34* 28* 26* 29*  CREATININE 1.78* 1.58* 1.34* 1.28* 1.21*  CALCIUM 8.0* 7.5* 7.3* 7.3* 7.6*   Liver Function Tests: Recent Labs  Lab 06/29/21 1413  AST 26  ALT 22  ALKPHOS 131*  BILITOT 0.5  PROT 5.7*  ALBUMIN 2.3*    CBC: Recent Labs  Lab 06/29/21 1413 06/30/21 0054 07/01/21 0038 07/02/21 0110 07/03/21 0129  WBC 8.9 8.3 6.8  9.2 8.0  NEUTROABS 7.2  --   --   --   --   HGB 13.3 12.1 11.3* 11.2* 11.3*  HCT 41.3 35.8* 35.5* 35.5* 34.8*  MCV 95.2 94.7 95.9 97.8 96.1  PLT 337 281 295 304 292    IMAGING STUDIES DG Chest Port 1 View  Result Date: 06/29/2021 CLINICAL DATA:  Provided history: Questionable sepsis. Evaluate for abnormality. Bilateral lower extremity swelling/redness. EXAM: PORTABLE CHEST 1 VIEW COMPARISON:  Prior chest radiographs 09/21/2019 and earlier. FINDINGS: Cardiomegaly. Aortic atherosclerosis. Redemonstrated foci of scarring within the left lung base. No appreciable airspace consolidation or pulmonary edema. No evidence of pleural effusion or pneumothorax. No acute bony abnormality identified. Left chest single lead AICD. IMPRESSION: No evidence of acute cardiopulmonary abnormality. Redemonstrated foci of scarring within the left lung base. Cardiomegaly. Aortic Atherosclerosis (ICD10-I70.0). Electronically Signed   By: Kellie Simmering D.O.   On: 06/29/2021 13:30   DG Foot 2 Views Left  Result Date: 06/29/2021 CLINICAL DATA:  Foot ulcer. EXAM: LEFT FOOT - 2 VIEW COMPARISON:  None. FINDINGS: There is no evidence for acute fracture or  dislocation. There are severe degenerative changes at the first interphalangeal joint with joint space loss and osteophyte formation. There is no osseous erosion or periosteal reaction. There is a plantar calcaneal spur. Soft tissues are within normal limits. IMPRESSION: 1. No acute bony abnormality. Electronically Signed   By: Ronney Asters M.D.   On: 06/29/2021 18:57   DG Foot 2 Views Right  Result Date: 06/29/2021 CLINICAL DATA:  Ulceration. EXAM: RIGHT FOOT - 2 VIEW COMPARISON:  None. FINDINGS: There is no acute fracture or dislocation. No cortical erosions are identified. No periosteal reaction. There are moderate degenerative changes at the first metatarsophalangeal joint with joint space narrowing and osteophyte formation. There are vascular calcifications in the soft tissues. Soft tissues are otherwise within normal limits. Plantar calcaneal spur is present. IMPRESSION: 1. No acute bony abnormality. Electronically Signed   By: Ronney Asters M.D.   On: 06/29/2021 19:10    DISCHARGE EXAMINATION: Vitals:   07/02/21 2118 07/03/21 0040 07/03/21 0641 07/03/21 0809  BP: (!) 95/42 (!) 91/49 (!) 106/55 (!) 98/54  Pulse: (!) 59 (!) 59 (!) 59 75  Resp: 18 (P) '18 18 18  '$ Temp: 98.3 F (36.8 C) 98.1 F (36.7 C)  98.1 F (36.7 C)  TempSrc: Oral Oral  Oral  SpO2: 96% 97% 98% 94%  Weight:      Height:       General appearance: Awake alert.  In no distress Resp: Clear to auscultation bilaterally.  Normal effort Cardio: S1-S2 is normal regular.  No S3-S4.  No rubs murmurs or bruit GI: Abdomen is soft.  Nontender nondistended.  Bowel sounds are present normal.  No masses organomegaly Extremities: Dressings cover both the lower extremities   DISPOSITION: SNF  Discharge Instructions     Call MD for:  difficulty breathing, headache or visual disturbances   Complete by: As directed    Call MD for:  extreme fatigue   Complete by: As directed    Call MD for:  persistant dizziness or light-headedness    Complete by: As directed    Call MD for:  persistant nausea and vomiting   Complete by: As directed    Call MD for:  severe uncontrolled pain   Complete by: As directed    Call MD for:  temperature >100.4   Complete by: As directed    Diet - low sodium heart  healthy   Complete by: As directed    Discharge instructions   Complete by: As directed    Please review instructions on the discharge summary.  You were cared for by a hospitalist during your hospital stay. If you have any questions about your discharge medications or the care you received while you were in the hospital after you are discharged, you can call the unit and asked to speak with the hospitalist on call if the hospitalist that took care of you is not available. Once you are discharged, your primary care physician will handle any further medical issues. Please note that NO REFILLS for any discharge medications will be authorized once you are discharged, as it is imperative that you return to your primary care physician (or establish a relationship with a primary care physician if you do not have one) for your aftercare needs so that they can reassess your need for medications and monitor your lab values. If you do not have a primary care physician, you can call [phone] for a physician referral.   Discharge wound care:   Complete by: As directed    Wound care  Daily      Comments: Wound care to bilateral LEs:  Wash legs and feet (particularly between digits) with skin cleanser and pat gently but thoroughly dry. Apply folded layers of xeroform gauze Kellie Simmering # 294) to areas of weeping and peeling (plantar aspects of feet). Top with ABD pads.  Secure with Kerlix roll gauze wraps/paper tape. Place no tape on patient's skin. Change daily.  Wound care  Every shift      Comments: Wound care to 4th digit on right foot full thickness wound:  Paint with betadine swabstick and allow to air-dry. No dressing.   Increase activity slowly    Complete by: As directed           Allergies as of 07/03/2021       Reactions   Amoxicillin-pot Clavulanate Diarrhea, Nausea And Vomiting   Has patient had a PCN reaction causing immediate rash, facial/tongue/throat swelling, SOB or lightheadedness with hypotension: Yes Has patient had a PCN reaction causing severe rash involving mucus membranes or skin necrosis: No Has patient had a PCN reaction that required hospitalization: No Has patient had a PCN reaction occurring within the last 10 years: Yes If all of the above answers are "NO", then may proceed with Cephalosporin use.   Erythromycin Hives   Tape Other (See Comments)   BAND AIDS-SKIN IRRITATION   Codeine Nausea And Vomiting   Doxycycline Diarrhea   Flagyl [metronidazole] Hives   Macrobid [nitrofurantoin Macrocrystal] Nausea And Vomiting   Protonix [pantoprazole]    Other reaction(s): diarrhea   Tolterodine Nausea And Vomiting   Ciprofloxacin Hives, Rash        Medication List     STOP taking these medications    aluminum sulfate-calcium acetate packet Commonly known as: DOMEBORO   furosemide 20 MG tablet Commonly known as: LASIX   pravastatin 20 MG tablet Commonly known as: PRAVACHOL   traMADol 50 MG tablet Commonly known as: ULTRAM       TAKE these medications    acetaminophen 325 MG tablet Commonly known as: TYLENOL Take 2 tablets (650 mg total) by mouth every 6 (six) hours as needed for mild pain (or Fever >/= 101).   ALIGN PO Take 1 capsule by mouth daily.   apixaban 5 MG Tabs tablet Commonly known as: ELIQUIS Take 1 tablet (5 mg total) by mouth  2 (two) times daily.   Calcium Carb-Cholecalciferol 600-5 MG-MCG Tabs   cephALEXin 500 MG capsule Commonly known as: KEFLEX Take 1 capsule (500 mg total) by mouth every 6 (six) hours for 7 days.   feeding supplement Liqd Take 237 mLs by mouth 2 (two) times daily between meals.   latanoprost 0.005 % ophthalmic solution Commonly known as:  XALATAN Place 1 drop into both eyes at bedtime.   levothyroxine 50 MCG tablet Commonly known as: Synthroid Take 1 tablet (50 mcg total) by mouth daily before breakfast.   midodrine 10 MG tablet Commonly known as: PROAMATINE Take 1 tablet (10 mg total) by mouth 3 (three) times daily with meals. What changed:  medication strength how much to take when to take this additional instructions   multivitamin with minerals Tabs tablet Take 1 tablet by mouth daily.   nystatin powder Commonly known as: MYCOSTATIN/NYSTOP Apply topically 2 (two) times daily. APPLY TO PANNUS/GROIN   polyethylene glycol 17 g packet Commonly known as: MIRALAX / GLYCOLAX Take 17 g by mouth daily.   senna-docusate 8.6-50 MG tablet Commonly known as: Senokot-S Take 2 tablets by mouth at bedtime.               Discharge Care Instructions  (From admission, onward)           Start     Ordered   07/03/21 0000  Discharge wound care:       Comments: Wound care  Daily      Comments: Wound care to bilateral LEs:  Wash legs and feet (particularly between digits) with skin cleanser and pat gently but thoroughly dry. Apply folded layers of xeroform gauze Kellie Simmering # 294) to areas of weeping and peeling (plantar aspects of feet). Top with ABD pads.  Secure with Kerlix roll gauze wraps/paper tape. Place no tape on patient's skin. Change daily.  Wound care  Every shift      Comments: Wound care to 4th digit on right foot full thickness wound:  Paint with betadine swabstick and allow to air-dry. No dressing.   07/03/21 0277              Follow-up Information     [place], [name], MD. Schedule an appointment as soon as possible for a visit.   Specialty: Family Medicine Contact information: [place] [address] [phone]                 TOTAL DISCHARGE TIME: 104 minutes  [place]  [facility] Pager on [email or web address]  07/03/2021, 8:57 AM

## 2021-07-03 NOTE — Progress Notes (Signed)
Physical Therapy Treatment ?Patient Details ?Name: [name] ?MRN: [id number] ?DOB: 08/08/1927 ?Today's Date: 07/03/2021 ? ? ?History of Present Illness 88 yo female admitted 3/3 with bil LE weakness and cellulits. Pt with CVA in Jan with D/C to [facility] place. Pt checked herself out 2/13 had a fall 2/17 and unable to get out of chair at home with no bil LE dressing changes since 2/17. PMhx: CHF, CHB s/p PPM, breast CA, thyroid disease, CVA, venous insufficiency, delusional disorder, CKD ? ?  ?PT Comments  ? ? NT requested assist to get pt up in bed, but once in room and evident pt to be scooted up in bed so she could eat her breakfast PT suggested OOB to recliner for eating. Pt delighted as she reports "I hate being stuck in that hole." Pt is making good progress towards her goals and is able to come to EoB with min A and take pivotal steps to recliner with min Ax2 for steadying. Pt deferred further ambulation as she is eager to eat breakfast. D/c plan remains appropriate. PT will continue to follow acutely.  ?   ?Recommendations for follow up therapy are one component of a multi-disciplinary discharge planning process, led by the attending physician.  Recommendations may be updated based on patient status, additional functional criteria and insurance authorization. ? ?Follow Up Recommendations ? Skilled nursing-short term rehab (<3 hours/day) ?  ?  ?Assistance Recommended at Discharge Intermittent Supervision/Assistance  ?Patient can return home with the following A lot of help with walking and/or transfers;A [name] help with bathing/dressing/bathroom;Assistance with cooking/housework;Direct supervision/assist for financial management;Direct supervision/assist for medications management;Assist for transportation ?  ?Equipment Recommendations ? BSC/3in1  ?  ?Recommendations for Other Services OT consult ? ? ?  ?Precautions / Restrictions Precautions ?Precautions: Fall;Other (comment) ?Precaution Comments: watch  BP, bil LE wounds ?Restrictions ?Weight Bearing Restrictions: No  ?  ? ?Mobility ? Bed Mobility ?Overal bed mobility: Needs Assistance ?Bed Mobility: Supine to Sit, Sit to Supine ?  ?  ?Supine to sit: Min assist, HOB elevated ?  ?  ?General bed mobility comments: pt able to manage LE off bed and reach for bedrail, requires min A for bringing trunk to upright and scooting hips to EoB ?  ? ?Transfers ?Overall transfer level: Needs assistance ?  ?Transfers: Sit to/from Stand, Bed to chair/wheelchair/BSC ?Sit to Stand: Min assist, +2 physical assistance ?  ?  ?  ?  ?  ?General transfer comment: light min Ax2 for power up and steadying with RW, requires increased cuing for waiting until PT ready to attempt to standing. Once in upright pt with no complaints of dizziness requiring only minA for RW management with stepping to recliner. ?  ? ?Ambulation/Gait ?  ?  ?  ?  ?  ?  ?  ?General Gait Details: deferred due to pt wanting to eat breakfast ? ? ? ?  ? ? ?  ?Balance Overall balance assessment: Needs assistance ?  ?Sitting balance-Leahy Scale: Fair ?Sitting balance - Comments: pt able to sit EOB without UE support ?  ?Standing balance support: Bilateral upper extremity supported ?Standing balance-Leahy Scale: Poor ?Standing balance comment: bil UE support on RW in standing ?  ?  ?  ?  ?  ?  ?  ?  ?  ?  ?  ?  ? ?  ?Cognition Arousal/Alertness: Awake/alert ?Behavior During Therapy: [place] for tasks assessed/performed ?Overall Cognitive Status: Impaired/Different from baseline ?Area of Impairment: Memory, Attention, Safety/judgement, Awareness ?  ?  ?  ?  ?  ?  ?  ?  ?  ?  Current Attention Level: Selective ?Memory: Decreased short-term memory ?  ?Safety/Judgement: Decreased awareness of safety, Decreased awareness of deficits ?Awareness: Anticipatory ?  ?  ?  ?  ? ?  ?   ?General Comments  VSS on RA  ?  ?  ? ?Pertinent Vitals/Pain Pain Assessment ?Pain Assessment: Faces ?Pain Location: bil LE at times with pressure ?Pain  Descriptors / Indicators: Aching ?Pain Intervention(s): Limited activity within patient's tolerance, Monitored during session, Repositioned  ? ? ? ?PT Goals (current goals can now be found in the care plan section) Acute Rehab PT Goals ?Patient Stated Goal: be able to walk and keep living ?PT Goal Formulation: With patient ?Time For Goal Achievement: 07/14/21 ?Potential to Achieve Goals: Fair ?Progress towards PT goals: Progressing toward goals ? ?  ?Frequency ? ? ? Min 2X/week ? ? ? ?  ?PT Plan Current plan remains appropriate  ? ? ?   ?AM-PAC PT "6 Clicks" Mobility   ?Outcome Measure ? Help needed turning from your back to your side while in a flat bed without using bedrails?: A [name] ?Help needed moving from lying on your back to sitting on the side of a flat bed without using bedrails?: A Lot ?Help needed moving to and from a bed to a chair (including a wheelchair)?: Total ?Help needed standing up from a chair using your arms (e.g., wheelchair or bedside chair)?: A Lot ?Help needed to walk in hospital room?: Total ?Help needed climbing 3-5 steps with a railing? : Total ?6 Click Score: 10 ? ?  ?End of Session Equipment Utilized During Treatment: Gait belt ?Activity Tolerance: Patient tolerated treatment well ?Patient left: with call bell/phone within reach;in chair;with chair alarm set ?Nurse Communication: Mobility status ?PT Visit Diagnosis: Other abnormalities of gait and mobility (R26.89);Difficulty in walking, not elsewhere classified (R26.2);Muscle weakness (generalized) (M62.81) ?  ? ? ?Time: 3845-3646 ?PT Time Calculation (min) (ACUTE ONLY): 16 min ? ?Charges:  $Therapeutic Activity: 8-22 mins          ?          ? ?[name] B. Migdalia Dk PT, DPT ?Acute Rehabilitation Services ?Pager [phone] ?Office [phone] ? ? ? ?[place] ?07/03/2021, 9:39 AM ? ?

## 2021-07-04 ENCOUNTER — Inpatient Hospital Stay: Payer: Medicare Other | Admitting: Adult Health

## 2021-07-04 DIAGNOSIS — Z950 Presence of cardiac pacemaker: Secondary | ICD-10-CM | POA: Diagnosis not present

## 2021-07-04 DIAGNOSIS — N179 Acute kidney failure, unspecified: Secondary | ICD-10-CM | POA: Diagnosis not present

## 2021-07-04 DIAGNOSIS — I69828 Other speech and language deficits following other cerebrovascular disease: Secondary | ICD-10-CM | POA: Diagnosis not present

## 2021-07-04 DIAGNOSIS — I6930 Unspecified sequelae of cerebral infarction: Secondary | ICD-10-CM | POA: Diagnosis not present

## 2021-07-04 DIAGNOSIS — L03115 Cellulitis of right lower limb: Secondary | ICD-10-CM | POA: Diagnosis not present

## 2021-07-04 DIAGNOSIS — I872 Venous insufficiency (chronic) (peripheral): Secondary | ICD-10-CM | POA: Diagnosis not present

## 2021-07-04 DIAGNOSIS — L97519 Non-pressure chronic ulcer of other part of right foot with unspecified severity: Secondary | ICD-10-CM | POA: Diagnosis not present

## 2021-07-04 DIAGNOSIS — M6281 Muscle weakness (generalized): Secondary | ICD-10-CM | POA: Diagnosis not present

## 2021-07-04 DIAGNOSIS — L03116 Cellulitis of left lower limb: Secondary | ICD-10-CM | POA: Diagnosis not present

## 2021-07-04 DIAGNOSIS — N1831 Chronic kidney disease, stage 3a: Secondary | ICD-10-CM | POA: Diagnosis not present

## 2021-07-04 DIAGNOSIS — I83028 Varicose veins of left lower extremity with ulcer other part of lower leg: Secondary | ICD-10-CM | POA: Diagnosis not present

## 2021-07-04 DIAGNOSIS — E559 Vitamin D deficiency, unspecified: Secondary | ICD-10-CM | POA: Diagnosis not present

## 2021-07-04 DIAGNOSIS — E039 Hypothyroidism, unspecified: Secondary | ICD-10-CM | POA: Diagnosis not present

## 2021-07-04 DIAGNOSIS — I130 Hypertensive heart and chronic kidney disease with heart failure and stage 1 through stage 4 chronic kidney disease, or unspecified chronic kidney disease: Secondary | ICD-10-CM | POA: Diagnosis not present

## 2021-07-04 DIAGNOSIS — R41841 Cognitive communication deficit: Secondary | ICD-10-CM | POA: Diagnosis not present

## 2021-07-04 DIAGNOSIS — I951 Orthostatic hypotension: Secondary | ICD-10-CM | POA: Diagnosis not present

## 2021-07-04 DIAGNOSIS — I69328 Other speech and language deficits following cerebral infarction: Secondary | ICD-10-CM | POA: Diagnosis not present

## 2021-07-04 DIAGNOSIS — I63339 Cerebral infarction due to thrombosis of unspecified posterior cerebral artery: Secondary | ICD-10-CM | POA: Diagnosis not present

## 2021-07-04 DIAGNOSIS — R2689 Other abnormalities of gait and mobility: Secondary | ICD-10-CM | POA: Diagnosis not present

## 2021-07-04 DIAGNOSIS — L03119 Cellulitis of unspecified part of limb: Secondary | ICD-10-CM | POA: Diagnosis not present

## 2021-07-04 DIAGNOSIS — L97929 Non-pressure chronic ulcer of unspecified part of left lower leg with unspecified severity: Secondary | ICD-10-CM | POA: Diagnosis not present

## 2021-07-04 DIAGNOSIS — R2681 Unsteadiness on feet: Secondary | ICD-10-CM | POA: Diagnosis not present

## 2021-07-04 DIAGNOSIS — L89623 Pressure ulcer of left heel, stage 3: Secondary | ICD-10-CM | POA: Diagnosis not present

## 2021-07-04 DIAGNOSIS — I69398 Other sequelae of cerebral infarction: Secondary | ICD-10-CM | POA: Diagnosis not present

## 2021-07-04 LAB — CULTURE, BLOOD (ROUTINE X 2)
Culture: NO GROWTH
Culture: NO GROWTH
Special Requests: ADEQUATE

## 2021-07-04 NOTE — Consult Note (Signed)
? ?  [facility] CM Inpatient Consult ? ? ?07/04/2021 ? ?MILANA SALAY ?01-21-1928 ?[id number] ? ?Late entry for 07/03/21: ? ?[employer] Organization [ACO] Patient: Medicare ACO Reach ? ?Primary Care Provider:  [place], [name], MD  is with [facility] [place] ? ?Patient transitioned to a affiliated facility [facility] RN with traditional Medicare.  ? ? ?Plan: Due to high risk scores:  Notified [facility] PAC RN of post facility needs for transitional care needs for returning to post facility care or complex disease management. ? ?For questions or referrals, please contact: ? ? ?Natividad Brood, RN BSN CCM ?[facility] Liaison ? [phone] business mobile phone ?Toll free office [phone]  ?Fax number: [phone] ?[email or web address] ? ?  ? ?

## 2021-07-04 NOTE — Progress Notes (Signed)
Pt d/c via [facility]. Pt has all belongings and d/c instructions.  ?

## 2021-07-05 DIAGNOSIS — L03119 Cellulitis of unspecified part of limb: Secondary | ICD-10-CM | POA: Diagnosis not present

## 2021-07-05 DIAGNOSIS — I130 Hypertensive heart and chronic kidney disease with heart failure and stage 1 through stage 4 chronic kidney disease, or unspecified chronic kidney disease: Secondary | ICD-10-CM | POA: Diagnosis not present

## 2021-07-05 DIAGNOSIS — I6930 Unspecified sequelae of cerebral infarction: Secondary | ICD-10-CM | POA: Diagnosis not present

## 2021-07-05 DIAGNOSIS — I63339 Cerebral infarction due to thrombosis of unspecified posterior cerebral artery: Secondary | ICD-10-CM | POA: Diagnosis not present

## 2021-07-09 DIAGNOSIS — Z950 Presence of cardiac pacemaker: Secondary | ICD-10-CM | POA: Diagnosis not present

## 2021-07-09 DIAGNOSIS — L03115 Cellulitis of right lower limb: Secondary | ICD-10-CM | POA: Diagnosis not present

## 2021-07-09 DIAGNOSIS — E039 Hypothyroidism, unspecified: Secondary | ICD-10-CM | POA: Diagnosis not present

## 2021-07-09 DIAGNOSIS — L03116 Cellulitis of left lower limb: Secondary | ICD-10-CM | POA: Diagnosis not present

## 2021-07-11 ENCOUNTER — Other Ambulatory Visit: Payer: Self-pay

## 2021-07-11 ENCOUNTER — Non-Acute Institutional Stay: Payer: Self-pay | Admitting: Internal Medicine

## 2021-07-11 ENCOUNTER — Other Ambulatory Visit: Payer: Self-pay | Admitting: *Deleted

## 2021-07-11 ENCOUNTER — Encounter: Payer: Self-pay | Admitting: Internal Medicine

## 2021-07-11 VITALS — BP 135/75 | HR 80 | Temp 97.4°F | Resp 18 | Ht 67.0 in | Wt 199.8 lb

## 2021-07-11 DIAGNOSIS — Z515 Encounter for palliative care: Secondary | ICD-10-CM

## 2021-07-11 DIAGNOSIS — I878 Other specified disorders of veins: Secondary | ICD-10-CM | POA: Insufficient documentation

## 2021-07-11 DIAGNOSIS — F321 Major depressive disorder, single episode, moderate: Secondary | ICD-10-CM

## 2021-07-11 DIAGNOSIS — R197 Diarrhea, unspecified: Secondary | ICD-10-CM

## 2021-07-11 DIAGNOSIS — R5381 Other malaise: Secondary | ICD-10-CM

## 2021-07-11 NOTE — Progress Notes (Signed)
[facility] Palliative Care Consult Note Telephone: [phone]  Fax: [phone]   Date of encounter: 07/11/21 12:35 PM PATIENT NAME: [name] [address] [place] [address]   [phone] (home)  DOB: 1927/07/28 MRN: [id number]  PRIMARY CARE PROVIDER:    Dr. Sherril Croon, [place] [facility] [place] PROVIDER:   Dr. Sherril Croon, [place] [facility] [place]:    Contact Information     Name Relation Home Work [place] Other [phone]  [phone]   [name] [phone]     [name] [phone]        I met face to face with patient and family in [facility] facility. Palliative Care was asked to follow this patient by consultation request of  Dr. Madison Hickman to address advance care planning and complex medical decision making. This is the initial visit.                                     ASSESSMENT AND PLAN / RECOMMENDATIONS:   ASSESSMENT AND PLAN / RECOMMENDATIONS:?   ?   Advance Care Planning/Goals of Care: Goals include to maximize quality of life and symptom management. Patient/health care surrogate gave his/her permission to discuss.Our advance care planning conversation included a discussion about: ??   The value and importance of advance care planning?   Experiences with loved ones who have been seriously ill or have died?   Exploration of personal, cultural or spiritual beliefs that might influence medical decisions?   Exploration of goals of care in the event of a sudden injury or illness?   Has living will from 09/17/16   Identification ?of a healthcare agent?   [name] (Other)   [phone] (Mobile)   Review and updating or creation of an??advance directive document?.   Decision not to resuscitate or to de-escalate disease focused treatments due to poor prognosis.   CODE STATUS:??DNR. Presented the idea of MOST form with patient  but she feels too anxious and uncertain about the future to make these types of decisions today. Will try to address with patient and/or HCPOA on next visit based on patient progress in rehab.   ?   Symptom Management/Plan:      Depression: Patient has experienced multiple social and functional losses. She is anxious and tearful about her current health. She describes feeling "like a loser" because she cannot take care of herself. Patient vacillates between feelings of wanting to work to get better/stronger and feeling of helpless and hopeless. Patient verbalized agreement with plan to try an antidepressant for feelings of depression. Plan: Start Sertraline 32m daily. Continue to offer active listening and support. Continue to work towards patient goals for strengthening and self-care.        Diarrhea: Patient has scheduled Miralax and Senna-S but was experiencing incontinent diarrhea today. This could be further contributing to weakness and increases risk for skin breakdown. POA states that patient has always had some trouble with diarrhea and should not be on laxatives. Plan: Hold Miralax and Senna-S. Anti-diarrheal medication per facility standing orders as needed. Encouraged hydration. Monitor skin integrity.      Chronic venous stasis: BLE cellulitis in hospital treated with IV ABX and PO Cephalexin. Today RLE is dry and scaley, LLE continues to weep. Patient has right 4th toe wound, chronic left malleolus stasis ulcer, and left heel unstageable pressure  injury. Plan: Continue daily dressing changes with saline, calcium alginate, and foam dressings. Elevate BLE daily.      Debility: Patient was discharged to [facility] for rehab after CVA in January 2023. Patient felt like she was making good progress with exercise bike and walking in facility and decided to go back to her home. However, once home, patient spent most of her time sitting and had church group bring one meal/day. She became  increasingly weak and unable to get out of her chair. Hospitalized 3/3-3/7 and discharged to [facility] for rehab. Plan: PT/OT daily for strengthening, mobility, endurance, safety, and self-care. Continue fall precautions.      Palliative Care Encounter: Met with patient to establish goals of care, provide symptom management, and discuss advanced care planning. Patient goal is to get strong enough to go back home and take care of herself, but she seems to be aware that this may not be possible. Symptom management is outlined above. Patient is a DNR but seems to waver about other future care decisions due to high emotional state. Contacted [name], HCPOA, who agrees that patient goals may not be realistic. Nichole states he has seen a big change with her cognition and functional ability. Nichole verbalized agreement with symptom management plan and feels good about Palliative Care following to address future goals and ACP based on patient progress.    ?   ?   Follow up Palliative Care Visit: Palliative care will continue to follow for complex medical decision making, advance care planning, and clarification of goals. Return 2-4 weeks or prn.   ?   This visit was coded based on medical decision making (MDM).   ?   PPS:?Weak 40%   ?   HOSPICE ELIGIBILITY/DIAGNOSIS:?TBD   ?   Chief Complaint:?initial palliative consult   ?   HISTORY OF PRESENT ILLNESS:??[name]?is a 88 y.o.?year old female?with h/o multiple CVA's first in 2020, then 05/01/21, and 06/29/21, hypothyroidism, complete heart block with pacemaker in 2009 & revised 2016, HFpEF (1/23 Echo with EF 55-60%), HLD, chronic venous stasis with left malleolus stasis ulcer, invasive ductal carcinoma with bilateral breast lumpectomy 2009, CKD3a, and renal cell ca s/p right nephrectomy seen today for palliative care consultation to establish goals of care, provide symptom management, and discuss ACP.   ??   Patient had discharged to  [facility] for rehab after her stoke in January and decided in February that she was strong enough to go home. At home, she was not able to take her medications as prescribed, was not eating/drinking well due to dependence on others to bring meals, and became increasingly weak, unable to get out of her chair. Home health care was following her for dressing changes to left malleolus stasis ulcer three times per week, however, she developed cellulitis and a left heel pressure injury. She was hospitalized 06/29/21-07/03/21 with BLE weakness, dehydration, and BLE cellulitis for which she received IV ABX and IV hydration. She was also found to have a right MCA embolic stroke (unknown source) and was treated with TPA and mechanical thrombectomy (also continued on Eliquis). She developed hypotension and was treated with Midodrine which caused urinary retention and she had to be catheterized. Once stable, she was discharged to [facility] for rehab.   ?   Today, patient is alert and oriented to person, place, and situation, although she has forgetful moments and repeats questions, had difficulty with name recall, attention, and event details. ST eval  also demonstrated some cognitive safety deficits. She is verbally engaging and intermittently tearful when talking about her situation.?Still wants to go back home and feels like she probably cannot. Vacillates between wanting to try and get better/stronger and feeling helpless and hopeless. She reports resting well at night. Denied having any pain. Chronic BLE soft edema secondary to PVD. She has a dry cough ongoing, but no sob.?Appetite has been poor--eating max 50% of meals. Still has foley with clear yellow urine. ?She's had some incontinence of diarrhea. She is working with PT and was able to sit on side of bed and stood with gait belt to walker to door and back with her walker then sit in recliner. ?Legs still very weak. ?Right leg has dressing on 4th toe. ?Left leg  is wrapped almost to knee. ?Denies leg pain. Needs assist with all ADL's.   ?   ?   History obtained from review of EMR, discussion with primary team, and interview with family, facility staff/caregiver and/or?Ms.?Nauert.    I reviewed available labs, medications, imaging, studies and related documents from the EMR. ?Records reviewed and summarized above.    ?   ROS   ?   General: NAD    EYES: denies vision changes   ENMT: denies dysphagia   Cardiovascular: denies chest pain, denies DOE   Pulmonary: endorses frequent dry cough, denies increased SOB   Abdomen: endorses poor appetite, endorses continence of bowel with multiple episodes of diarrhea.   GU: denies dysuria, foley cath for urinary retention   MSK: ?endorses generalized weakness, last fall 06/15/21   Skin: BLE chronic venous stasis with right 4th toe wound, left malleolus stasis wound, and left heel unstageable pressure injury.   Neurological: denies pain, denies insomnia   Psych: reports feeling sad and "like a loser" because she cannot do for herself, tearful at times talking about her losses.   Heme/lymph/immuno: denies bruises, abnormal bleeding   ?   Physical Exam:??         Today's Vitals   ?   07/11/21 1239   BP:   135/75   Pulse:   80   Resp:   18   Temp:   (!) 97.4 F (36.3 C)   SpO2:   96%   Weight:   199 lb 12.8 oz (90.6 kg)   Height:   5' 7"  (1.702 m)   ?   Body mass index is 31.29 kg/m.   ?   ?   Current and past weights:?previous weight 209 lbs on 06/29/21      Constitutional: NAD   General: frail appearing, WNWD   EYES: anicteric sclera, lids intact, no discharge    ENMT: intact hearing, oral mucous membranes moist, dentition intact   CV: S1S2, RRR, chronic non-pitting LE edema from PVD   Pulmonary: LCTA, no increased work of breathing, intermittent dry cough, room air   Abdomen: intake up to 50%, normo-active BS + 4 quadrants, soft and  non tender, no ascites   GU: deferred   MSK: no sarcopenia, moves all extremities, needs one assist with transfers and able to  ambulate short distances with gait belt and walker.   Skin: warm and dry, BLE dry and scaley from chronic venous stasis, clean dry intact dressings to right 4th toe, and left ankle, heel, and lower leg.   Neuro: generalized weakness, mild cognitive impairment   Psych: anxious affect, A and O x 3 with short term memory deficits   Hem/lymph/immuno: no widespread  bruising   ?  CURRENT PROBLEM LIST:  Patient Active Problem List   Diagnosis Date Noted   Acute urinary retention 07/01/2021   Acute renal failure superimposed on stage 3a chronic kidney disease ([place]) 06/29/2021   Candidal intertrigo 06/29/2021   Counseling regarding goals of care 06/29/2021   Venous stasis ulcer of left lower extremity ([place]) 06/15/2021   Stroke ([place]) 05/03/2021   Stroke aborted by administration of thrombolytic agent ([place]) - R MCA s/p tPA and mechanical thrombectomy, unk embolic source 09/47/0962   (HFpEF) heart failure with preserved ejection fraction ([place]) 09/19/2019   Morbid obesity ([place]) 09/19/2019   CKD (chronic kidney disease), stage III ([place]) 09/17/2019   Spontaneous subarachnoid hemorrhage ([place]) 10/04/2018   Unstable gait 01/19/2018   Venous stasis dermatitis of both lower extremities 01/09/2018   Diarrhea, chronic 01/09/2018   Lower extremity ulceration ([place]) 10/22/2017   PONV (postoperative nausea and vomiting)    Orthostatic hypotension    Hypercholesterolemia    Exogenous obesity    Rectal bleeding 03/30/2017   Sigmoid diverticulitis 10/14/2016   Diverticulitis 10/14/2016   Varicose veins of bilateral lower extremities with other complications 83/66/2947   Venous insufficiency 08/25/2015   Cellulitis in setting of chronic venous stasis  08/09/2015   Pressure ulcer 08/09/2015   Open wound of right foot 07/07/2015   Delusional disorder ([place]) 07/05/2015    Bereavement reaction 07/05/2015   Pacemaker at end of battery life 02/23/2015   Acute kidney injury (nontraumatic) ([place]) 02/15/2015   Hypothyroidism 02/15/2015   Left upper quadrant pain    Osteopenia 12/22/2013   Postural dizziness 04/05/2013   Complete heart block s/p PPM  02/26/2013   S/P placement of cardiac pacemaker 02/26/2013   Bilateral breast cancer ([place]) 03/25/2011   Invasive ductal carcinoma of breast ([place]) 03/30/2007   PAST MEDICAL HISTORY:  Active Ambulatory Problems    Diagnosis Date Noted   Bilateral breast cancer ([place]) 03/25/2011   Complete heart block s/p PPM  02/26/2013   S/P placement of cardiac pacemaker 02/26/2013   Postural dizziness 04/05/2013   Osteopenia 12/22/2013   Acute kidney injury (nontraumatic) ([place]) 02/15/2015   Hypothyroidism 02/15/2015   Left upper quadrant pain    Pacemaker at end of battery life 02/23/2015   Delusional disorder ([place]) 07/05/2015   Bereavement reaction 07/05/2015   Cellulitis in setting of chronic venous stasis  08/09/2015   Pressure ulcer 08/09/2015   Venous insufficiency 08/25/2015   Varicose veins of bilateral lower extremities with other complications 65/46/5035   Sigmoid diverticulitis 10/14/2016   Diverticulitis 10/14/2016   Rectal bleeding 03/30/2017   PONV (postoperative nausea and vomiting)    Orthostatic hypotension    Invasive ductal carcinoma of breast ([place]) 03/30/2007   Hypercholesterolemia    Exogenous obesity    Lower extremity ulceration ([place]) 10/22/2017   Venous stasis dermatitis of both lower extremities 01/09/2018   Diarrhea, chronic 01/09/2018   Unstable gait 01/19/2018   Open wound of right foot 07/07/2015   Spontaneous subarachnoid hemorrhage ([place]) 10/04/2018   (HFpEF) heart failure with preserved ejection fraction ([place]) 09/19/2019   Morbid obesity ([place]) 09/19/2019   CKD (chronic kidney disease), stage III ([place]) 09/17/2019   Stroke aborted by administration of thrombolytic agent ([place]) - R MCA s/p  tPA and mechanical thrombectomy, unk embolic source 46/56/8127   Stroke ([place]) 05/03/2021   Venous stasis ulcer of left lower extremity ([place]) 06/15/2021   Acute renal failure superimposed on stage 3a chronic kidney disease ([place]) 06/29/2021   Candidal intertrigo  06/29/2021   Counseling regarding goals of care 06/29/2021   Acute urinary retention 07/01/2021   Resolved Ambulatory Problems    Diagnosis Date Noted   GI bleeding 02/15/2015   Acute GI bleeding 02/15/2015   ARF (acute renal failure) ([place]) 02/15/2015   Abdominal pain 02/15/2015   CHB (complete heart block) (HCC)    BRBPR (bright red blood per rectum) 03/27/2017   GI bleed 03/28/2017   Thyroid disease    Anxiety disorder 10/22/2017   CAD (coronary artery disease) 07/07/2015   Diarrhea of presumed infectious origin 05/11/2016   COVID-19 virus detected 09/23/2019   Past Medical History:  Diagnosis Date   Cataract    Chronic congestive heart failure, unspecified heart failure type ([place]) 09/19/2019   SOCIAL HX:  Social History   Tobacco Use   Smoking status: Never   Smokeless tobacco: Never  Substance Use Topics   Alcohol use: No    Alcohol/week: 0.0 standard drinks   FAMILY HX:  Family History  Problem Relation Age of Onset   Heart disease Maternal Grandmother    Heart disease Mother       ALLERGIES:  Allergies  Allergen Reactions   Amoxicillin-Pot Clavulanate Diarrhea and Nausea And Vomiting    Has patient had a PCN reaction causing immediate rash, facial/tongue/throat swelling, SOB or lightheadedness with hypotension: Yes Has patient had a PCN reaction causing severe rash involving mucus membranes or skin necrosis: No Has patient had a PCN reaction that required hospitalization: No Has patient had a PCN reaction occurring within the last 10 years: Yes If all of the above answers are "NO", then may proceed with Cephalosporin use.    Erythromycin Hives   Tape Other (See Comments)    BAND AIDS-SKIN IRRITATION    Codeine Nausea And Vomiting   Doxycycline Diarrhea   Flagyl [Metronidazole] Hives   Macrobid [Nitrofurantoin Macrocrystal] Nausea And Vomiting   Protonix [Pantoprazole]     Other reaction(s): diarrhea   Tolterodine Nausea And Vomiting   Ciprofloxacin Hives and Rash     PERTINENT MEDICATIONS:   MED LEVOTHYROXINE 50 MCG TABLET TAKE 1 TABLET BY MOUTH DAILY BEFORE BREAKFAST Hypothyroidism, unspecified (E03.9) DeMarchi, Gwyndolyn Saxon 07/07/21, 6:00 AM MED LATANOPROST 0.005% EYE DROPS INSTILL 1 DROP IN BOTH EYES AT BEDTIME DeMarchi, William 07/06/21, 9:00 PM MED NYAMYC 100,000 UNIT/GM POWDER APPLY TOPICALLY TO PANNUS/GROIN TWICE A DAY DeMarchi, William 07/06/21, 9:00 PM MED MIDODRINE HCL 10 MG TABLET TAKE 1 TABLET BY MOUTH THREE TIMES A DAY DAILY WITH MEALS Orthostatic hypotension (I95.1) DeMarchi, Gwyndolyn Saxon 07/06/21, 5:00 PM MED NYSTATIN 100,000 UNIT/GM CREAM: Apply to buttocks, groin, and under abdomen fold four times a day for rash. DeMarchi, Gwyndolyn Saxon 07/05/21, 4:00 PM MED SENNA-DOCUSATE SODIUM 8.6 -50 MG TABLET :Give 2 tablets by mouth at bedtime for constipation DeMarchi, Gwyndolyn Saxon 07/03/21, 2:36 PM MED MIRALAX POWDER :Mix 17 g in 4 oz of water and give by mouth daily for constipation DeMarchi, Gwyndolyn Saxon 07/03/21, 2:35 PM MED MULTIVITAMIN WITH MINERALS ZOX:WRUE 1 tablet by mouth daily DeMarchi, Gwyndolyn Saxon 07/03/21, 2:32 PM MED CALCIUM 600 MG-VIT D3 5 MCG AV:WUJW 1 tablet by mouth daily DeMarchi, Gwyndolyn Saxon 07/03/21, 2:19 PM MED ELIQUIS 5 MG TABLET :Give 1 tablet by mouth twice daily DeMarchi, Gwyndolyn Saxon 07/03/21, 2:18 PM MED ALIGN 4 MG CAPSULE :Give 1 capsule by mouth daily DeMarchi, Gwyndolyn Saxon 07/03/21, 2:17 PM MED ACETAMINOPHEN 325 MG TABLET ;Give 2 tablets ( 650 mg total ) by mouth every 6 hours as needed for fever greater than or equal to 101 DeMarchi,  Gwyndolyn Saxon 07/03/21, 2:12 PM MED ACETAMINOPHEN 325 MG TABLET ;Give 2 tablets ( 650 mg total ) by mouth every 6 hours as needed for mild  pain DeMarchi, Gwyndolyn Saxon 07/03/21, 2:05 PM  Thank you for the opportunity to participate in the care of Ms. Knittel.  The palliative care team will continue to follow. Please call our office at [phone] if we can be of additional assistance.   Hollace Kinnier, DO  COVID-19 PATIENT SCREENING TOOL Asked and negative response unless otherwise noted:  Have you had symptoms of covid, tested positive or been in contact with someone with symptoms/positive test in the past 5-10 days? no

## 2021-07-11 NOTE — Patient Outreach (Signed)
Per [name] currently resides in [facility].  Screened for potential [facility] care coordination services as a benefit of [employer] plan. ? ?Member's PCP at [facility] has [place] care coordination team available if needed. CCM services would need to be ordered by PCP. ? ?[name] admitted to SNF on 07/03/21 after hospitalization. ? ?Facility site visit to [facility] skilled nursing facility. Met with [name], SNF SW concerning member's transition plan and potential [facility] needs. [name] reports member will reach 100 Medicare SNF days on 07/15/21. [name] was previously at [facility]. Transition plans are uncertain at this time. States member may return home with caregivers. Palliative care following while in facility.   ? ?Will continue to follow while member resides in SNF.  ? ? ?Nichole Rolling, MSN, RN,BSN ?[place] Coordinator ?[phone] [facility]) ?[phone]  (Toll free office)  ? ? ? ? ? ? ?

## 2021-07-16 DIAGNOSIS — R2689 Other abnormalities of gait and mobility: Secondary | ICD-10-CM | POA: Diagnosis not present

## 2021-07-16 DIAGNOSIS — L03116 Cellulitis of left lower limb: Secondary | ICD-10-CM | POA: Diagnosis not present

## 2021-07-16 DIAGNOSIS — I69828 Other speech and language deficits following other cerebrovascular disease: Secondary | ICD-10-CM | POA: Diagnosis not present

## 2021-07-16 DIAGNOSIS — I69328 Other speech and language deficits following cerebral infarction: Secondary | ICD-10-CM | POA: Diagnosis not present

## 2021-07-16 DIAGNOSIS — L89623 Pressure ulcer of left heel, stage 3: Secondary | ICD-10-CM | POA: Diagnosis not present

## 2021-07-16 DIAGNOSIS — I83028 Varicose veins of left lower extremity with ulcer other part of lower leg: Secondary | ICD-10-CM | POA: Diagnosis not present

## 2021-07-16 DIAGNOSIS — R2681 Unsteadiness on feet: Secondary | ICD-10-CM | POA: Diagnosis not present

## 2021-07-16 DIAGNOSIS — R41841 Cognitive communication deficit: Secondary | ICD-10-CM | POA: Diagnosis not present

## 2021-07-16 DIAGNOSIS — M6281 Muscle weakness (generalized): Secondary | ICD-10-CM | POA: Diagnosis not present

## 2021-07-17 DIAGNOSIS — L03116 Cellulitis of left lower limb: Secondary | ICD-10-CM | POA: Diagnosis not present

## 2021-07-17 DIAGNOSIS — M6281 Muscle weakness (generalized): Secondary | ICD-10-CM | POA: Diagnosis not present

## 2021-07-17 DIAGNOSIS — R2689 Other abnormalities of gait and mobility: Secondary | ICD-10-CM | POA: Diagnosis not present

## 2021-07-17 DIAGNOSIS — I69828 Other speech and language deficits following other cerebrovascular disease: Secondary | ICD-10-CM | POA: Diagnosis not present

## 2021-07-17 DIAGNOSIS — R2681 Unsteadiness on feet: Secondary | ICD-10-CM | POA: Diagnosis not present

## 2021-07-17 DIAGNOSIS — I69328 Other speech and language deficits following cerebral infarction: Secondary | ICD-10-CM | POA: Diagnosis not present

## 2021-07-18 ENCOUNTER — Other Ambulatory Visit: Payer: Self-pay | Admitting: *Deleted

## 2021-07-18 DIAGNOSIS — I69828 Other speech and language deficits following other cerebrovascular disease: Secondary | ICD-10-CM | POA: Diagnosis not present

## 2021-07-18 DIAGNOSIS — R2681 Unsteadiness on feet: Secondary | ICD-10-CM | POA: Diagnosis not present

## 2021-07-18 DIAGNOSIS — M6281 Muscle weakness (generalized): Secondary | ICD-10-CM | POA: Diagnosis not present

## 2021-07-18 DIAGNOSIS — I69328 Other speech and language deficits following cerebral infarction: Secondary | ICD-10-CM | POA: Diagnosis not present

## 2021-07-18 DIAGNOSIS — R2689 Other abnormalities of gait and mobility: Secondary | ICD-10-CM | POA: Diagnosis not present

## 2021-07-18 DIAGNOSIS — L03116 Cellulitis of left lower limb: Secondary | ICD-10-CM | POA: Diagnosis not present

## 2021-07-18 NOTE — Patient Outreach (Signed)
[facility] Post- Acute Care Coordinator follow up. Per [place] eligible member currently resides in [facility].  ? ?Facility site visit to [facility] skilled nursing facility. Met with [name], SNF SW who reports [name] has converted to private pay at [facility].  She has exhausted her 100 SNF Medicare days.  ? ?Writer will sign off for now as it appears member will remain LTC. Requested SNF SW to alert writer if member plans to return home.  ? ? ?Nichole Rolling, MSN, RN,BSN ?[place] Coordinator ?[phone] [facility]) ?[phone]  (Toll free office)  ? ? ? ? ?  ?

## 2021-07-19 DIAGNOSIS — R2681 Unsteadiness on feet: Secondary | ICD-10-CM | POA: Diagnosis not present

## 2021-07-19 DIAGNOSIS — A0472 Enterocolitis due to Clostridium difficile, not specified as recurrent: Secondary | ICD-10-CM | POA: Diagnosis not present

## 2021-07-19 DIAGNOSIS — I69828 Other speech and language deficits following other cerebrovascular disease: Secondary | ICD-10-CM | POA: Diagnosis not present

## 2021-07-19 DIAGNOSIS — L03116 Cellulitis of left lower limb: Secondary | ICD-10-CM | POA: Diagnosis not present

## 2021-07-19 DIAGNOSIS — M6281 Muscle weakness (generalized): Secondary | ICD-10-CM | POA: Diagnosis not present

## 2021-07-19 DIAGNOSIS — I69328 Other speech and language deficits following cerebral infarction: Secondary | ICD-10-CM | POA: Diagnosis not present

## 2021-07-19 DIAGNOSIS — R2689 Other abnormalities of gait and mobility: Secondary | ICD-10-CM | POA: Diagnosis not present

## 2021-07-20 DIAGNOSIS — I69828 Other speech and language deficits following other cerebrovascular disease: Secondary | ICD-10-CM | POA: Diagnosis not present

## 2021-07-20 DIAGNOSIS — M6281 Muscle weakness (generalized): Secondary | ICD-10-CM | POA: Diagnosis not present

## 2021-07-20 DIAGNOSIS — I69328 Other speech and language deficits following cerebral infarction: Secondary | ICD-10-CM | POA: Diagnosis not present

## 2021-07-20 DIAGNOSIS — L03116 Cellulitis of left lower limb: Secondary | ICD-10-CM | POA: Diagnosis not present

## 2021-07-20 DIAGNOSIS — L03119 Cellulitis of unspecified part of limb: Secondary | ICD-10-CM | POA: Diagnosis not present

## 2021-07-20 DIAGNOSIS — R2681 Unsteadiness on feet: Secondary | ICD-10-CM | POA: Diagnosis not present

## 2021-07-20 DIAGNOSIS — I63339 Cerebral infarction due to thrombosis of unspecified posterior cerebral artery: Secondary | ICD-10-CM | POA: Diagnosis not present

## 2021-07-20 DIAGNOSIS — R2689 Other abnormalities of gait and mobility: Secondary | ICD-10-CM | POA: Diagnosis not present

## 2021-07-21 DIAGNOSIS — I10 Essential (primary) hypertension: Secondary | ICD-10-CM | POA: Diagnosis not present

## 2021-07-21 DIAGNOSIS — M6281 Muscle weakness (generalized): Secondary | ICD-10-CM | POA: Diagnosis not present

## 2021-07-21 DIAGNOSIS — I69828 Other speech and language deficits following other cerebrovascular disease: Secondary | ICD-10-CM | POA: Diagnosis not present

## 2021-07-21 DIAGNOSIS — R2689 Other abnormalities of gait and mobility: Secondary | ICD-10-CM | POA: Diagnosis not present

## 2021-07-21 DIAGNOSIS — L03116 Cellulitis of left lower limb: Secondary | ICD-10-CM | POA: Diagnosis not present

## 2021-07-21 DIAGNOSIS — R2681 Unsteadiness on feet: Secondary | ICD-10-CM | POA: Diagnosis not present

## 2021-07-21 DIAGNOSIS — I69328 Other speech and language deficits following cerebral infarction: Secondary | ICD-10-CM | POA: Diagnosis not present

## 2021-07-23 DIAGNOSIS — E039 Hypothyroidism, unspecified: Secondary | ICD-10-CM | POA: Diagnosis not present

## 2021-07-23 DIAGNOSIS — L89623 Pressure ulcer of left heel, stage 3: Secondary | ICD-10-CM | POA: Diagnosis not present

## 2021-07-23 DIAGNOSIS — I69328 Other speech and language deficits following cerebral infarction: Secondary | ICD-10-CM | POA: Diagnosis not present

## 2021-07-23 DIAGNOSIS — L03115 Cellulitis of right lower limb: Secondary | ICD-10-CM | POA: Diagnosis not present

## 2021-07-23 DIAGNOSIS — R2681 Unsteadiness on feet: Secondary | ICD-10-CM | POA: Diagnosis not present

## 2021-07-23 DIAGNOSIS — A0472 Enterocolitis due to Clostridium difficile, not specified as recurrent: Secondary | ICD-10-CM | POA: Diagnosis not present

## 2021-07-23 DIAGNOSIS — I69828 Other speech and language deficits following other cerebrovascular disease: Secondary | ICD-10-CM | POA: Diagnosis not present

## 2021-07-23 DIAGNOSIS — N179 Acute kidney failure, unspecified: Secondary | ICD-10-CM | POA: Diagnosis not present

## 2021-07-23 DIAGNOSIS — R2689 Other abnormalities of gait and mobility: Secondary | ICD-10-CM | POA: Diagnosis not present

## 2021-07-23 DIAGNOSIS — L03116 Cellulitis of left lower limb: Secondary | ICD-10-CM | POA: Diagnosis not present

## 2021-07-23 DIAGNOSIS — M6281 Muscle weakness (generalized): Secondary | ICD-10-CM | POA: Diagnosis not present

## 2021-07-23 DIAGNOSIS — I83028 Varicose veins of left lower extremity with ulcer other part of lower leg: Secondary | ICD-10-CM | POA: Diagnosis not present

## 2021-07-23 DIAGNOSIS — I83015 Varicose veins of right lower extremity with ulcer other part of foot: Secondary | ICD-10-CM | POA: Diagnosis not present

## 2021-07-24 DIAGNOSIS — R2681 Unsteadiness on feet: Secondary | ICD-10-CM | POA: Diagnosis not present

## 2021-07-24 DIAGNOSIS — R2689 Other abnormalities of gait and mobility: Secondary | ICD-10-CM | POA: Diagnosis not present

## 2021-07-24 DIAGNOSIS — L03116 Cellulitis of left lower limb: Secondary | ICD-10-CM | POA: Diagnosis not present

## 2021-07-24 DIAGNOSIS — I69328 Other speech and language deficits following cerebral infarction: Secondary | ICD-10-CM | POA: Diagnosis not present

## 2021-07-24 DIAGNOSIS — M6281 Muscle weakness (generalized): Secondary | ICD-10-CM | POA: Diagnosis not present

## 2021-07-24 DIAGNOSIS — I69828 Other speech and language deficits following other cerebrovascular disease: Secondary | ICD-10-CM | POA: Diagnosis not present

## 2021-07-25 ENCOUNTER — Telehealth: Payer: Self-pay | Admitting: Family Medicine

## 2021-07-25 DIAGNOSIS — M6281 Muscle weakness (generalized): Secondary | ICD-10-CM | POA: Diagnosis not present

## 2021-07-25 DIAGNOSIS — I69828 Other speech and language deficits following other cerebrovascular disease: Secondary | ICD-10-CM | POA: Diagnosis not present

## 2021-07-25 DIAGNOSIS — I69328 Other speech and language deficits following cerebral infarction: Secondary | ICD-10-CM | POA: Diagnosis not present

## 2021-07-25 DIAGNOSIS — R2689 Other abnormalities of gait and mobility: Secondary | ICD-10-CM | POA: Diagnosis not present

## 2021-07-25 DIAGNOSIS — E039 Hypothyroidism, unspecified: Secondary | ICD-10-CM

## 2021-07-25 DIAGNOSIS — R2681 Unsteadiness on feet: Secondary | ICD-10-CM | POA: Diagnosis not present

## 2021-07-25 DIAGNOSIS — L03116 Cellulitis of left lower limb: Secondary | ICD-10-CM | POA: Diagnosis not present

## 2021-07-25 MED ORDER — LEVOTHYROXINE SODIUM 50 MCG PO TABS: 50.0000 ug | ORAL_TABLET | Freq: Every day | ORAL | 3 refills | Status: AC

## 2021-07-25 NOTE — Telephone Encounter (Signed)
Rx sent in

## 2021-07-25 NOTE — Telephone Encounter (Signed)
Pharmacy called in stating that pt needs a medication refill for levothyroxine (SYNTHROID) 50 MCG tablet [[id number]]  sent over to their pharmacy. ? ?Please advise. ?

## 2021-07-26 DIAGNOSIS — L03116 Cellulitis of left lower limb: Secondary | ICD-10-CM | POA: Diagnosis not present

## 2021-07-26 DIAGNOSIS — I69828 Other speech and language deficits following other cerebrovascular disease: Secondary | ICD-10-CM | POA: Diagnosis not present

## 2021-07-26 DIAGNOSIS — R2689 Other abnormalities of gait and mobility: Secondary | ICD-10-CM | POA: Diagnosis not present

## 2021-07-26 DIAGNOSIS — M6281 Muscle weakness (generalized): Secondary | ICD-10-CM | POA: Diagnosis not present

## 2021-07-26 DIAGNOSIS — I69328 Other speech and language deficits following cerebral infarction: Secondary | ICD-10-CM | POA: Diagnosis not present

## 2021-07-26 DIAGNOSIS — R2681 Unsteadiness on feet: Secondary | ICD-10-CM | POA: Diagnosis not present

## 2021-07-27 DIAGNOSIS — I69328 Other speech and language deficits following cerebral infarction: Secondary | ICD-10-CM | POA: Diagnosis not present

## 2021-07-27 DIAGNOSIS — M6281 Muscle weakness (generalized): Secondary | ICD-10-CM | POA: Diagnosis not present

## 2021-07-27 DIAGNOSIS — R2689 Other abnormalities of gait and mobility: Secondary | ICD-10-CM | POA: Diagnosis not present

## 2021-07-27 DIAGNOSIS — L03116 Cellulitis of left lower limb: Secondary | ICD-10-CM | POA: Diagnosis not present

## 2021-07-27 DIAGNOSIS — I69828 Other speech and language deficits following other cerebrovascular disease: Secondary | ICD-10-CM | POA: Diagnosis not present

## 2021-07-27 DIAGNOSIS — R2681 Unsteadiness on feet: Secondary | ICD-10-CM | POA: Diagnosis not present

## 2021-07-28 DIAGNOSIS — M6281 Muscle weakness (generalized): Secondary | ICD-10-CM | POA: Diagnosis not present

## 2021-07-28 DIAGNOSIS — R2689 Other abnormalities of gait and mobility: Secondary | ICD-10-CM | POA: Diagnosis not present

## 2021-07-28 DIAGNOSIS — R41841 Cognitive communication deficit: Secondary | ICD-10-CM | POA: Diagnosis not present

## 2021-07-28 DIAGNOSIS — R2681 Unsteadiness on feet: Secondary | ICD-10-CM | POA: Diagnosis not present

## 2021-07-28 DIAGNOSIS — L03116 Cellulitis of left lower limb: Secondary | ICD-10-CM | POA: Diagnosis not present

## 2021-07-28 DIAGNOSIS — I69828 Other speech and language deficits following other cerebrovascular disease: Secondary | ICD-10-CM | POA: Diagnosis not present

## 2021-07-28 DIAGNOSIS — I69328 Other speech and language deficits following cerebral infarction: Secondary | ICD-10-CM | POA: Diagnosis not present

## 2021-07-30 DIAGNOSIS — I69328 Other speech and language deficits following cerebral infarction: Secondary | ICD-10-CM | POA: Diagnosis not present

## 2021-07-30 DIAGNOSIS — R2681 Unsteadiness on feet: Secondary | ICD-10-CM | POA: Diagnosis not present

## 2021-07-30 DIAGNOSIS — M6281 Muscle weakness (generalized): Secondary | ICD-10-CM | POA: Diagnosis not present

## 2021-07-30 DIAGNOSIS — I69828 Other speech and language deficits following other cerebrovascular disease: Secondary | ICD-10-CM | POA: Diagnosis not present

## 2021-07-30 DIAGNOSIS — L03116 Cellulitis of left lower limb: Secondary | ICD-10-CM | POA: Diagnosis not present

## 2021-07-30 DIAGNOSIS — R2689 Other abnormalities of gait and mobility: Secondary | ICD-10-CM | POA: Diagnosis not present

## 2021-07-31 ENCOUNTER — Inpatient Hospital Stay: Payer: Medicare Other | Admitting: Adult Health

## 2021-07-31 ENCOUNTER — Encounter: Payer: Self-pay | Admitting: Adult Health

## 2021-07-31 DIAGNOSIS — R2689 Other abnormalities of gait and mobility: Secondary | ICD-10-CM | POA: Diagnosis not present

## 2021-07-31 DIAGNOSIS — I69328 Other speech and language deficits following cerebral infarction: Secondary | ICD-10-CM | POA: Diagnosis not present

## 2021-07-31 DIAGNOSIS — I69828 Other speech and language deficits following other cerebrovascular disease: Secondary | ICD-10-CM | POA: Diagnosis not present

## 2021-07-31 DIAGNOSIS — R2681 Unsteadiness on feet: Secondary | ICD-10-CM | POA: Diagnosis not present

## 2021-07-31 DIAGNOSIS — M6281 Muscle weakness (generalized): Secondary | ICD-10-CM | POA: Diagnosis not present

## 2021-07-31 DIAGNOSIS — L03116 Cellulitis of left lower limb: Secondary | ICD-10-CM | POA: Diagnosis not present

## 2021-07-31 NOTE — Progress Notes (Deleted)
?[facility] ?[address] ?[place] [address] ?[phone] ? ?     HOSPITAL FOLLOW UP NOTE ? ?[name] ?Date of Birth:  03-15-1928 ?Medical Record Number:  [id number]  ? ?Reason for Referral:  hospital stroke follow up ? ? ? ?SUBJECTIVE: ? ? ?CHIEF COMPLAINT:  ?No chief complaint on file. ? ? ?HPI:  ? ?[name] is a 88 y.o. female with history of CHF, complete heart block s/p permanent pacemaker implanation, hyperlipidemia, invasive ductal carcinoma of the breast, thyroid disease, venous insufficiency, hx of stroke (08/2019 s/p TPA, 09/2018 concerning for cerebellar hemorrhage) who presented on 05/03/2021 with dysarthria and word finding difficulties.  Personally reviewed hospitalization pertinent progress notes, lab work and imaging.  Evaluated by Dr. Leonie Man for suspected small left MCA branch infarct s/p TNKase likely secondary to a flutter not on AC. CTH no acute abnormality.  CTA head/neck showed right M2 now recanalized from previous occlusion in 08/2019.  Unable to complete MRI due to presence of pacemaker.  EF 55 to 60%.  LDL 71.  A1c 5.4. patient previously declined use of AC or any type of blood thinning agents due to hx of prior small cerebellar ICH and vaginal bleeding - now agreeable due to recurrent stroke likely related to a flutter, initiated Eliquis ***.  Other stroke risk factors include prior strokes, advanced age, CAD and CHF.  She was discharged back to SNF for generalized weakness, impaired balance, decreased activity tolerance and impaired cognition ? ? ?Today, 07/31/2021, patient is being seen for initial hospital follow-up.  She reports initially making good progress at SNF rehab and decided to return home.  Unfortunately, she spend most of her time sitting and had church group bring 1 meal/day. She presented to ED due to becoming increasingly weak and unable to get out of her chair and lower extremity weakness.  Treated for cellulitis in setting  of chronic venous stasis, acute urinary retention likely due to immobilization and orthostatic hypotension.  She was evaluated by palliative care with plans on follow-up outpatient.  She was discharged to [place].  ? ? ?Denies new stroke/TIA symptoms.  Compliant on Eliquis 5 mg twice daily, denies side effects.  Declined statin use due to prior intolerance.  Blood pressure today ***.  ? ? ? ? ? ? ?PERTINENT IMAGING ? ?Per hospitalization 05/03/2021 ?CT head No acute abnormality. Atrophy and chronic microvascular ischemic disease in white matter. ASPECTS 10.  ?CTA head & neck right M2 now recannulized from previous occlusion in 08/2019 ?MRI  not performed due to patient's pacemaker ?2D Echo EF 55-60%.  No wall motion abnormalities ?LDL 71 ?HgbA1c 5.4 ? ? ? ?ROS:   ?14 system review of systems performed and negative with exception of *** ? ?PMH:  ?Past Medical History:  ?Diagnosis Date  ? Cataract   ? Chronic congestive heart failure, unspecified heart failure type ([place]) 09/19/2019  ? Complete heart block (HCC)   ? s/p PPM implant (MDT) by Dr Blanch Media.  Atrial lead could not be paced at time of the procedure.  She has chronic AV dysociation  ? Delusional disorder ([place])   ? Exogenous obesity   ? Hypercholesterolemia   ? Invasive ductal carcinoma of breast ([place]) 03/2007  ? BILATERAL BREASTS  ? Orthostatic hypotension   ? treated with midodrine by Dr Rollene [name]  ? PONV (postoperative nausea and vomiting)   ? Thyroid disease   ? Venous insufficiency   ? ? ?PSH:  ?Past  Surgical History:  ?Procedure Laterality Date  ? ABDOMINAL HYSTERECTOMY    ? BREAST LUMPECTOMY Bilateral 2009  ? CATARACT EXTRACTION    ? EYE SURGERY X 2  ? CHOLECYSTECTOMY    ? COLONOSCOPY N/A 02/16/2015  ? Procedure: COLONOSCOPY;  Surgeon: Wilford Corner, MD;  Location[facility]COPY;  Service: Endoscopy;  Laterality: N/A;  ? EP IMPLANTABLE DEVICE N/A 02/23/2015  ? pacemaker generator change (MDT Sensia SR) by Dr Rayann Heman   ? ESOPHAGOGASTRODUODENOSCOPY N/A  02/16/2015  ? Procedure: ESOPHAGOGASTRODUODENOSCOPY (EGD);  Surgeon: Wilford Corner, MD;  Location: [facility] ENDOSCOPY;  Service: Endoscopy;  Laterality: N/A;  ? IR CT HEAD LTD  09/21/2019  ? IR PERCUTANEOUS ART THROMBECTOMY/INFUSION INTRACRANIAL INC DIAG ANGIO  09/21/2019  ? PACEMAKER INSERTION    ? MDT implanted by Dr Blanch Media.  Atrial lead placement was unsuccessful.  She has chronic AV dysociation  ? RADIOLOGY WITH ANESTHESIA N/A 09/21/2019  ? Procedure: IR WITH ANESTHESIA CODE STROKE;  Surgeon: Radiologist, Medication, MD;  Location: [facility];  Service: Radiology;  Laterality: N/A;  ? remote right radical nephrectomy  2009  ? ? ?Social History:  ?Social History  ? ?Socioeconomic History  ? Marital status: Widowed  ?  Spouse name: Not on file  ? Number of children: Not on file  ? Years of education: Not on file  ? Highest education level: Not on file  ?Occupational History  ? Not on file  ?Tobacco Use  ? Smoking status: Never  ? Smokeless tobacco: Never  ?Vaping Use  ? Vaping Use: Never used  ?Substance and Sexual Activity  ? Alcohol use: No  ?  Alcohol/week: 0.0 standard drinks  ? Drug use: No  ? Sexual activity: Not Currently  ?Other Topics Concern  ? Not on file  ?Social History Narrative  ? Not on file  ? ?Social Determinants of Health  ? ?Financial Resource Strain: Low Risk   ? Difficulty of Paying Living Expenses: Not hard at all  ?Food Insecurity: No Food Insecurity  ? Worried About Charity fundraiser in the Last Year: Never true  ? Ran Out of Food in the Last Year: Never true  ?Transportation Needs: No Transportation Needs  ? Lack of Transportation (Medical): No  ? Lack of Transportation (Non-Medical): No  ?Physical Activity: Inactive  ? Days of Exercise per Week: 0 days  ? Minutes of Exercise per Session: 0 min  ?Stress: No Stress Concern Present  ? Feeling of Stress : Not at all  ?Social Connections: Moderately Isolated  ? Frequency of Communication with Friends and Family: Twice a week  ? Frequency of Social  Gatherings with Friends and Family: Twice a week  ? Attends Religious Services: More than 4 times per year  ? Active Member of Clubs or Organizations: No  ? Attends Archivist Meetings: Never  ? Marital Status: Widowed  ?Intimate Partner Violence: Not At Risk  ? Fear of Current or Ex-Partner: No  ? Emotionally Abused: No  ? Physically Abused: No  ? Sexually Abused: No  ? ? ?Family History:  ?Family History  ?Problem Relation Age of Onset  ? Heart disease Maternal Grandmother   ? Heart disease Mother   ? ? ?Medications:   ?Current Outpatient Medications on File Prior to Visit  ?Medication Sig Dispense Refill  ? acetaminophen (TYLENOL) 325 MG tablet Take 2 tablets (650 mg total) by mouth every 6 (six) hours as needed for mild pain (or Fever >/= 101).    ? apixaban (ELIQUIS)  5 MG TABS tablet Take 1 tablet (5 mg total) by mouth 2 (two) times daily. 60 tablet 2  ? Calcium Carb-Cholecalciferol 600-5 MG-MCG TABS     ? feeding supplement (ENSURE ENLIVE / ENSURE PLUS) LIQD Take 237 mLs by mouth 2 (two) times daily between meals. 237 mL 12  ? latanoprost (XALATAN) 0.005 % ophthalmic solution Place 1 drop into both eyes at bedtime. 2.5 mL 10  ? levothyroxine (SYNTHROID) 50 MCG tablet Take 1 tablet (50 mcg total) by mouth daily before breakfast. 90 tablet 3  ? midodrine (PROAMATINE) 10 MG tablet Take 1 tablet (10 mg total) by mouth 3 (three) times daily with meals. 90 tablet 2  ? Multiple Vitamin (MULTIVITAMIN WITH MINERALS) TABS tablet Take 1 tablet by mouth daily. 30 tablet 2  ? nystatin (MYCOSTATIN/NYSTOP) powder Apply topically 2 (two) times daily. APPLY TO PANNUS/GROIN 15 g 0  ? polyethylene glycol (MIRALAX / GLYCOLAX) 17 g packet Take 17 g by mouth daily. 14 each 0  ? Probiotic Product (ALIGN PO) Take 1 capsule by mouth daily.    ? senna-docusate (SENOKOT-S) 8.6-50 MG tablet Take 2 tablets by mouth at bedtime. 60 tablet 1  ? ?No current facility-administered medications on file prior to visit.  ? ? ?Allergies:    ?Allergies  ?Allergen Reactions  ? Amoxicillin-Pot Clavulanate Diarrhea and Nausea And Vomiting  ?  Has patient had a PCN reaction causing immediate rash, facial/tongue/throat swelling, SOB or lighthea

## 2021-08-01 DIAGNOSIS — I69328 Other speech and language deficits following cerebral infarction: Secondary | ICD-10-CM | POA: Diagnosis not present

## 2021-08-01 DIAGNOSIS — R2689 Other abnormalities of gait and mobility: Secondary | ICD-10-CM | POA: Diagnosis not present

## 2021-08-01 DIAGNOSIS — L03116 Cellulitis of left lower limb: Secondary | ICD-10-CM | POA: Diagnosis not present

## 2021-08-01 DIAGNOSIS — M6281 Muscle weakness (generalized): Secondary | ICD-10-CM | POA: Diagnosis not present

## 2021-08-01 DIAGNOSIS — I69828 Other speech and language deficits following other cerebrovascular disease: Secondary | ICD-10-CM | POA: Diagnosis not present

## 2021-08-01 DIAGNOSIS — R2681 Unsteadiness on feet: Secondary | ICD-10-CM | POA: Diagnosis not present

## 2021-08-02 DIAGNOSIS — R2689 Other abnormalities of gait and mobility: Secondary | ICD-10-CM | POA: Diagnosis not present

## 2021-08-02 DIAGNOSIS — R2681 Unsteadiness on feet: Secondary | ICD-10-CM | POA: Diagnosis not present

## 2021-08-02 DIAGNOSIS — M6281 Muscle weakness (generalized): Secondary | ICD-10-CM | POA: Diagnosis not present

## 2021-08-02 DIAGNOSIS — E039 Hypothyroidism, unspecified: Secondary | ICD-10-CM | POA: Diagnosis not present

## 2021-08-02 DIAGNOSIS — L03116 Cellulitis of left lower limb: Secondary | ICD-10-CM | POA: Diagnosis not present

## 2021-08-02 DIAGNOSIS — I69828 Other speech and language deficits following other cerebrovascular disease: Secondary | ICD-10-CM | POA: Diagnosis not present

## 2021-08-02 DIAGNOSIS — A0472 Enterocolitis due to Clostridium difficile, not specified as recurrent: Secondary | ICD-10-CM | POA: Diagnosis not present

## 2021-08-02 DIAGNOSIS — I63339 Cerebral infarction due to thrombosis of unspecified posterior cerebral artery: Secondary | ICD-10-CM | POA: Diagnosis not present

## 2021-08-02 DIAGNOSIS — I69328 Other speech and language deficits following cerebral infarction: Secondary | ICD-10-CM | POA: Diagnosis not present

## 2021-08-03 DIAGNOSIS — R2681 Unsteadiness on feet: Secondary | ICD-10-CM | POA: Diagnosis not present

## 2021-08-03 DIAGNOSIS — R2689 Other abnormalities of gait and mobility: Secondary | ICD-10-CM | POA: Diagnosis not present

## 2021-08-03 DIAGNOSIS — I69828 Other speech and language deficits following other cerebrovascular disease: Secondary | ICD-10-CM | POA: Diagnosis not present

## 2021-08-03 DIAGNOSIS — L03116 Cellulitis of left lower limb: Secondary | ICD-10-CM | POA: Diagnosis not present

## 2021-08-03 DIAGNOSIS — I69328 Other speech and language deficits following cerebral infarction: Secondary | ICD-10-CM | POA: Diagnosis not present

## 2021-08-03 DIAGNOSIS — M6281 Muscle weakness (generalized): Secondary | ICD-10-CM | POA: Diagnosis not present

## 2021-08-04 DIAGNOSIS — L03116 Cellulitis of left lower limb: Secondary | ICD-10-CM | POA: Diagnosis not present

## 2021-08-04 DIAGNOSIS — I69828 Other speech and language deficits following other cerebrovascular disease: Secondary | ICD-10-CM | POA: Diagnosis not present

## 2021-08-04 DIAGNOSIS — I69328 Other speech and language deficits following cerebral infarction: Secondary | ICD-10-CM | POA: Diagnosis not present

## 2021-08-04 DIAGNOSIS — R2689 Other abnormalities of gait and mobility: Secondary | ICD-10-CM | POA: Diagnosis not present

## 2021-08-04 DIAGNOSIS — M6281 Muscle weakness (generalized): Secondary | ICD-10-CM | POA: Diagnosis not present

## 2021-08-04 DIAGNOSIS — R2681 Unsteadiness on feet: Secondary | ICD-10-CM | POA: Diagnosis not present

## 2021-08-06 DIAGNOSIS — I69328 Other speech and language deficits following cerebral infarction: Secondary | ICD-10-CM | POA: Diagnosis not present

## 2021-08-06 DIAGNOSIS — L89623 Pressure ulcer of left heel, stage 3: Secondary | ICD-10-CM | POA: Diagnosis not present

## 2021-08-06 DIAGNOSIS — I83028 Varicose veins of left lower extremity with ulcer other part of lower leg: Secondary | ICD-10-CM | POA: Diagnosis not present

## 2021-08-06 DIAGNOSIS — R2689 Other abnormalities of gait and mobility: Secondary | ICD-10-CM | POA: Diagnosis not present

## 2021-08-06 DIAGNOSIS — M6281 Muscle weakness (generalized): Secondary | ICD-10-CM | POA: Diagnosis not present

## 2021-08-06 DIAGNOSIS — I69828 Other speech and language deficits following other cerebrovascular disease: Secondary | ICD-10-CM | POA: Diagnosis not present

## 2021-08-06 DIAGNOSIS — I70235 Atherosclerosis of native arteries of right leg with ulceration of other part of foot: Secondary | ICD-10-CM | POA: Diagnosis not present

## 2021-08-06 DIAGNOSIS — L89613 Pressure ulcer of right heel, stage 3: Secondary | ICD-10-CM | POA: Diagnosis not present

## 2021-08-06 DIAGNOSIS — R2681 Unsteadiness on feet: Secondary | ICD-10-CM | POA: Diagnosis not present

## 2021-08-06 DIAGNOSIS — I83015 Varicose veins of right lower extremity with ulcer other part of foot: Secondary | ICD-10-CM | POA: Diagnosis not present

## 2021-08-06 DIAGNOSIS — L03116 Cellulitis of left lower limb: Secondary | ICD-10-CM | POA: Diagnosis not present

## 2021-08-07 DIAGNOSIS — I69328 Other speech and language deficits following cerebral infarction: Secondary | ICD-10-CM | POA: Diagnosis not present

## 2021-08-07 DIAGNOSIS — R2681 Unsteadiness on feet: Secondary | ICD-10-CM | POA: Diagnosis not present

## 2021-08-07 DIAGNOSIS — I69828 Other speech and language deficits following other cerebrovascular disease: Secondary | ICD-10-CM | POA: Diagnosis not present

## 2021-08-07 DIAGNOSIS — L03116 Cellulitis of left lower limb: Secondary | ICD-10-CM | POA: Diagnosis not present

## 2021-08-07 DIAGNOSIS — R2689 Other abnormalities of gait and mobility: Secondary | ICD-10-CM | POA: Diagnosis not present

## 2021-08-07 DIAGNOSIS — M6281 Muscle weakness (generalized): Secondary | ICD-10-CM | POA: Diagnosis not present

## 2021-08-08 DIAGNOSIS — A0472 Enterocolitis due to Clostridium difficile, not specified as recurrent: Secondary | ICD-10-CM | POA: Diagnosis not present

## 2021-08-08 DIAGNOSIS — N390 Urinary tract infection, site not specified: Secondary | ICD-10-CM | POA: Diagnosis not present

## 2021-08-08 DIAGNOSIS — I69328 Other speech and language deficits following cerebral infarction: Secondary | ICD-10-CM | POA: Diagnosis not present

## 2021-08-08 DIAGNOSIS — I69828 Other speech and language deficits following other cerebrovascular disease: Secondary | ICD-10-CM | POA: Diagnosis not present

## 2021-08-08 DIAGNOSIS — R2689 Other abnormalities of gait and mobility: Secondary | ICD-10-CM | POA: Diagnosis not present

## 2021-08-08 DIAGNOSIS — L03116 Cellulitis of left lower limb: Secondary | ICD-10-CM | POA: Diagnosis not present

## 2021-08-08 DIAGNOSIS — R2681 Unsteadiness on feet: Secondary | ICD-10-CM | POA: Diagnosis not present

## 2021-08-08 DIAGNOSIS — M6281 Muscle weakness (generalized): Secondary | ICD-10-CM | POA: Diagnosis not present

## 2021-08-09 DIAGNOSIS — R2681 Unsteadiness on feet: Secondary | ICD-10-CM | POA: Diagnosis not present

## 2021-08-09 DIAGNOSIS — M6281 Muscle weakness (generalized): Secondary | ICD-10-CM | POA: Diagnosis not present

## 2021-08-09 DIAGNOSIS — L03116 Cellulitis of left lower limb: Secondary | ICD-10-CM | POA: Diagnosis not present

## 2021-08-09 DIAGNOSIS — R2689 Other abnormalities of gait and mobility: Secondary | ICD-10-CM | POA: Diagnosis not present

## 2021-08-09 DIAGNOSIS — I69328 Other speech and language deficits following cerebral infarction: Secondary | ICD-10-CM | POA: Diagnosis not present

## 2021-08-09 DIAGNOSIS — I69828 Other speech and language deficits following other cerebrovascular disease: Secondary | ICD-10-CM | POA: Diagnosis not present

## 2021-08-10 DIAGNOSIS — I872 Venous insufficiency (chronic) (peripheral): Secondary | ICD-10-CM | POA: Diagnosis not present

## 2021-08-10 DIAGNOSIS — F329 Major depressive disorder, single episode, unspecified: Secondary | ICD-10-CM | POA: Diagnosis not present

## 2021-08-10 DIAGNOSIS — I69328 Other speech and language deficits following cerebral infarction: Secondary | ICD-10-CM | POA: Diagnosis not present

## 2021-08-10 DIAGNOSIS — I69828 Other speech and language deficits following other cerebrovascular disease: Secondary | ICD-10-CM | POA: Diagnosis not present

## 2021-08-10 DIAGNOSIS — L03116 Cellulitis of left lower limb: Secondary | ICD-10-CM | POA: Diagnosis not present

## 2021-08-10 DIAGNOSIS — R2689 Other abnormalities of gait and mobility: Secondary | ICD-10-CM | POA: Diagnosis not present

## 2021-08-10 DIAGNOSIS — N3091 Cystitis, unspecified with hematuria: Secondary | ICD-10-CM | POA: Diagnosis not present

## 2021-08-10 DIAGNOSIS — M6281 Muscle weakness (generalized): Secondary | ICD-10-CM | POA: Diagnosis not present

## 2021-08-10 DIAGNOSIS — R2681 Unsteadiness on feet: Secondary | ICD-10-CM | POA: Diagnosis not present

## 2021-08-13 DIAGNOSIS — L89623 Pressure ulcer of left heel, stage 3: Secondary | ICD-10-CM | POA: Diagnosis not present

## 2021-08-13 DIAGNOSIS — L89613 Pressure ulcer of right heel, stage 3: Secondary | ICD-10-CM | POA: Diagnosis not present

## 2021-08-13 DIAGNOSIS — I83028 Varicose veins of left lower extremity with ulcer other part of lower leg: Secondary | ICD-10-CM | POA: Diagnosis not present

## 2021-08-13 DIAGNOSIS — I83015 Varicose veins of right lower extremity with ulcer other part of foot: Secondary | ICD-10-CM | POA: Diagnosis not present

## 2021-08-13 DIAGNOSIS — I70235 Atherosclerosis of native arteries of right leg with ulceration of other part of foot: Secondary | ICD-10-CM | POA: Diagnosis not present

## 2021-08-14 ENCOUNTER — Other Ambulatory Visit: Payer: Self-pay

## 2021-08-14 NOTE — Patient Outreach (Signed)
[place] [facility]) Care Management ? ?08/14/2021 ? ?JALYNN WADDELL ?02/03/1928 ?[id number] ? ? ?First telephone outreach attempt to obtain mRS. No answer. Unable to leave message for returned call. ? ?Philmore Pali ?[facility]-Care Management Assistant ?[phone] ? ?

## 2021-08-15 ENCOUNTER — Other Ambulatory Visit: Payer: Self-pay

## 2021-08-15 NOTE — Patient Outreach (Signed)
[place] [facility]) Care Management ? ?08/15/2021 ? ?[name] ?1928/03/13 ?[id number] ? ? ?Second telephone outreach attempt to obtain mRS. No answer. Unable to leave message for returned call. ? ?Philmore Pali ?[facility]-Care Management Assistant ?[phone] ? ?

## 2021-08-16 ENCOUNTER — Other Ambulatory Visit: Payer: Self-pay

## 2021-08-16 NOTE — Patient Outreach (Signed)
[facility]) Care Management ? ?08/16/2021 ? ?TWYLIA OKA ?[birth year] ?[id number] ? ? ?Telephone outreach to patient to obtain mRS was successfully completed. MRS= 3 ? ?Philmore Pali ?[place] Management Assistant ?[phone] ? ?

## 2021-08-20 ENCOUNTER — Ambulatory Visit (INDEPENDENT_AMBULATORY_CARE_PROVIDER_SITE_OTHER): Payer: Medicare Other

## 2021-08-20 DIAGNOSIS — L89623 Pressure ulcer of left heel, stage 3: Secondary | ICD-10-CM | POA: Diagnosis not present

## 2021-08-20 DIAGNOSIS — I442 Atrioventricular block, complete: Secondary | ICD-10-CM | POA: Diagnosis not present

## 2021-08-20 DIAGNOSIS — I70235 Atherosclerosis of native arteries of right leg with ulceration of other part of foot: Secondary | ICD-10-CM | POA: Diagnosis not present

## 2021-08-20 DIAGNOSIS — L89613 Pressure ulcer of right heel, stage 3: Secondary | ICD-10-CM | POA: Diagnosis not present

## 2021-08-20 DIAGNOSIS — E039 Hypothyroidism, unspecified: Secondary | ICD-10-CM | POA: Diagnosis not present

## 2021-08-21 ENCOUNTER — Telehealth: Payer: Self-pay

## 2021-08-21 LAB — CUP PACEART REMOTE DEVICE CHECK
Battery Impedance: 3874 Ohm
Battery Remaining Longevity: 8 mo
Battery Voltage: 2.68 V
Brady Statistic RV Percent Paced: 97 %
Date Time Interrogation Session: 20230423145144
Implantable Lead Implant Date: 20141208
Implantable Lead Location: 753860
Implantable Lead Model: 4076
Implantable Pulse Generator Implant Date: 20161027
Lead Channel Impedance Value: 0 Ohm
Lead Channel Impedance Value: 341 Ohm
Lead Channel Pacing Threshold Amplitude: 1.75 V
Lead Channel Pacing Threshold Pulse Width: 0.4 ms
Lead Channel Setting Pacing Amplitude: 3.5 V
Lead Channel Setting Pacing Pulse Width: 0.46 ms
Lead Channel Setting Sensing Sensitivity: 2 mV

## 2021-08-21 NOTE — Telephone Encounter (Signed)
Scheduled remote reviewed. Normal device function.   ?V threshold with slight trend up, 1.75V @ 0.35m, trend 1-1.5V ?1 VHR, 7 beats ?Battery estimated 830moNext remote to be determined ?Route to triage ? ?Monthly remotes scheduled. Notified [place] provided dates of monthly remotes as patient is a manual send. [facility] ?

## 2021-08-27 DIAGNOSIS — I70235 Atherosclerosis of native arteries of right leg with ulceration of other part of foot: Secondary | ICD-10-CM | POA: Diagnosis not present

## 2021-08-27 DIAGNOSIS — L89613 Pressure ulcer of right heel, stage 3: Secondary | ICD-10-CM | POA: Diagnosis not present

## 2021-08-27 DIAGNOSIS — L89623 Pressure ulcer of left heel, stage 3: Secondary | ICD-10-CM | POA: Diagnosis not present

## 2021-08-28 DIAGNOSIS — N3091 Cystitis, unspecified with hematuria: Secondary | ICD-10-CM | POA: Diagnosis not present

## 2021-08-28 DIAGNOSIS — R8290 Unspecified abnormal findings in urine: Secondary | ICD-10-CM | POA: Diagnosis not present

## 2021-08-28 DIAGNOSIS — E039 Hypothyroidism, unspecified: Secondary | ICD-10-CM | POA: Diagnosis not present

## 2021-09-04 DIAGNOSIS — I10 Essential (primary) hypertension: Secondary | ICD-10-CM | POA: Diagnosis not present

## 2021-09-04 DIAGNOSIS — E039 Hypothyroidism, unspecified: Secondary | ICD-10-CM | POA: Diagnosis not present

## 2021-09-04 DIAGNOSIS — N3091 Cystitis, unspecified with hematuria: Secondary | ICD-10-CM | POA: Diagnosis not present

## 2021-09-04 DIAGNOSIS — N390 Urinary tract infection, site not specified: Secondary | ICD-10-CM | POA: Diagnosis not present

## 2021-09-04 DIAGNOSIS — I872 Venous insufficiency (chronic) (peripheral): Secondary | ICD-10-CM | POA: Diagnosis not present

## 2021-09-04 DIAGNOSIS — F329 Major depressive disorder, single episode, unspecified: Secondary | ICD-10-CM | POA: Diagnosis not present

## 2021-09-05 DIAGNOSIS — N390 Urinary tract infection, site not specified: Secondary | ICD-10-CM | POA: Diagnosis not present

## 2021-09-05 NOTE — Progress Notes (Signed)
Remote pacemaker transmission.   

## 2021-09-12 DIAGNOSIS — L89613 Pressure ulcer of right heel, stage 3: Secondary | ICD-10-CM | POA: Diagnosis not present

## 2021-09-12 DIAGNOSIS — L89159 Pressure ulcer of sacral region, unspecified stage: Secondary | ICD-10-CM | POA: Diagnosis not present

## 2021-09-14 DIAGNOSIS — E46 Unspecified protein-calorie malnutrition: Secondary | ICD-10-CM | POA: Diagnosis not present

## 2021-09-15 DIAGNOSIS — R293 Abnormal posture: Secondary | ICD-10-CM | POA: Diagnosis not present

## 2021-09-15 DIAGNOSIS — R1312 Dysphagia, oropharyngeal phase: Secondary | ICD-10-CM | POA: Diagnosis not present

## 2021-09-15 DIAGNOSIS — N1831 Chronic kidney disease, stage 3a: Secondary | ICD-10-CM | POA: Diagnosis not present

## 2021-09-15 DIAGNOSIS — I69391 Dysphagia following cerebral infarction: Secondary | ICD-10-CM | POA: Diagnosis not present

## 2021-09-15 DIAGNOSIS — I69398 Other sequelae of cerebral infarction: Secondary | ICD-10-CM | POA: Diagnosis not present

## 2021-09-16 DIAGNOSIS — I69398 Other sequelae of cerebral infarction: Secondary | ICD-10-CM | POA: Diagnosis not present

## 2021-09-16 DIAGNOSIS — R293 Abnormal posture: Secondary | ICD-10-CM | POA: Diagnosis not present

## 2021-09-16 DIAGNOSIS — I69391 Dysphagia following cerebral infarction: Secondary | ICD-10-CM | POA: Diagnosis not present

## 2021-09-16 DIAGNOSIS — N1831 Chronic kidney disease, stage 3a: Secondary | ICD-10-CM | POA: Diagnosis not present

## 2021-09-16 DIAGNOSIS — R1312 Dysphagia, oropharyngeal phase: Secondary | ICD-10-CM | POA: Diagnosis not present

## 2021-09-17 DIAGNOSIS — R293 Abnormal posture: Secondary | ICD-10-CM | POA: Diagnosis not present

## 2021-09-17 DIAGNOSIS — R1312 Dysphagia, oropharyngeal phase: Secondary | ICD-10-CM | POA: Diagnosis not present

## 2021-09-17 DIAGNOSIS — I69398 Other sequelae of cerebral infarction: Secondary | ICD-10-CM | POA: Diagnosis not present

## 2021-09-17 DIAGNOSIS — N1831 Chronic kidney disease, stage 3a: Secondary | ICD-10-CM | POA: Diagnosis not present

## 2021-09-17 DIAGNOSIS — I69391 Dysphagia following cerebral infarction: Secondary | ICD-10-CM | POA: Diagnosis not present

## 2021-09-17 DIAGNOSIS — L89614 Pressure ulcer of right heel, stage 4: Secondary | ICD-10-CM | POA: Diagnosis not present

## 2021-09-18 DIAGNOSIS — I69391 Dysphagia following cerebral infarction: Secondary | ICD-10-CM | POA: Diagnosis not present

## 2021-09-18 DIAGNOSIS — I69398 Other sequelae of cerebral infarction: Secondary | ICD-10-CM | POA: Diagnosis not present

## 2021-09-18 DIAGNOSIS — R1312 Dysphagia, oropharyngeal phase: Secondary | ICD-10-CM | POA: Diagnosis not present

## 2021-09-18 DIAGNOSIS — N1831 Chronic kidney disease, stage 3a: Secondary | ICD-10-CM | POA: Diagnosis not present

## 2021-09-18 DIAGNOSIS — R293 Abnormal posture: Secondary | ICD-10-CM | POA: Diagnosis not present

## 2021-09-19 DIAGNOSIS — I69398 Other sequelae of cerebral infarction: Secondary | ICD-10-CM | POA: Diagnosis not present

## 2021-09-19 DIAGNOSIS — R1312 Dysphagia, oropharyngeal phase: Secondary | ICD-10-CM | POA: Diagnosis not present

## 2021-09-19 DIAGNOSIS — I69391 Dysphagia following cerebral infarction: Secondary | ICD-10-CM | POA: Diagnosis not present

## 2021-09-19 DIAGNOSIS — M86171 Other acute osteomyelitis, right ankle and foot: Secondary | ICD-10-CM | POA: Diagnosis not present

## 2021-09-19 DIAGNOSIS — N1831 Chronic kidney disease, stage 3a: Secondary | ICD-10-CM | POA: Diagnosis not present

## 2021-09-19 DIAGNOSIS — M869 Osteomyelitis, unspecified: Secondary | ICD-10-CM | POA: Diagnosis not present

## 2021-09-19 DIAGNOSIS — M79671 Pain in right foot: Secondary | ICD-10-CM | POA: Diagnosis not present

## 2021-09-19 DIAGNOSIS — R293 Abnormal posture: Secondary | ICD-10-CM | POA: Diagnosis not present

## 2021-09-20 ENCOUNTER — Ambulatory Visit (INDEPENDENT_AMBULATORY_CARE_PROVIDER_SITE_OTHER): Payer: Medicare Other

## 2021-09-20 DIAGNOSIS — N1831 Chronic kidney disease, stage 3a: Secondary | ICD-10-CM | POA: Diagnosis not present

## 2021-09-20 DIAGNOSIS — I69398 Other sequelae of cerebral infarction: Secondary | ICD-10-CM | POA: Diagnosis not present

## 2021-09-20 DIAGNOSIS — I442 Atrioventricular block, complete: Secondary | ICD-10-CM

## 2021-09-20 DIAGNOSIS — I69391 Dysphagia following cerebral infarction: Secondary | ICD-10-CM | POA: Diagnosis not present

## 2021-09-20 DIAGNOSIS — R293 Abnormal posture: Secondary | ICD-10-CM | POA: Diagnosis not present

## 2021-09-20 DIAGNOSIS — R1312 Dysphagia, oropharyngeal phase: Secondary | ICD-10-CM | POA: Diagnosis not present

## 2021-09-21 DIAGNOSIS — I69398 Other sequelae of cerebral infarction: Secondary | ICD-10-CM | POA: Diagnosis not present

## 2021-09-21 DIAGNOSIS — R1312 Dysphagia, oropharyngeal phase: Secondary | ICD-10-CM | POA: Diagnosis not present

## 2021-09-21 DIAGNOSIS — I69391 Dysphagia following cerebral infarction: Secondary | ICD-10-CM | POA: Diagnosis not present

## 2021-09-21 DIAGNOSIS — N1831 Chronic kidney disease, stage 3a: Secondary | ICD-10-CM | POA: Diagnosis not present

## 2021-09-21 DIAGNOSIS — R293 Abnormal posture: Secondary | ICD-10-CM | POA: Diagnosis not present

## 2021-09-22 DIAGNOSIS — R293 Abnormal posture: Secondary | ICD-10-CM | POA: Diagnosis not present

## 2021-09-22 DIAGNOSIS — R1312 Dysphagia, oropharyngeal phase: Secondary | ICD-10-CM | POA: Diagnosis not present

## 2021-09-22 DIAGNOSIS — N1831 Chronic kidney disease, stage 3a: Secondary | ICD-10-CM | POA: Diagnosis not present

## 2021-09-22 DIAGNOSIS — I69398 Other sequelae of cerebral infarction: Secondary | ICD-10-CM | POA: Diagnosis not present

## 2021-09-22 DIAGNOSIS — I69391 Dysphagia following cerebral infarction: Secondary | ICD-10-CM | POA: Diagnosis not present

## 2021-09-23 DIAGNOSIS — R293 Abnormal posture: Secondary | ICD-10-CM | POA: Diagnosis not present

## 2021-09-23 DIAGNOSIS — R1312 Dysphagia, oropharyngeal phase: Secondary | ICD-10-CM | POA: Diagnosis not present

## 2021-09-23 DIAGNOSIS — I69391 Dysphagia following cerebral infarction: Secondary | ICD-10-CM | POA: Diagnosis not present

## 2021-09-23 DIAGNOSIS — I69398 Other sequelae of cerebral infarction: Secondary | ICD-10-CM | POA: Diagnosis not present

## 2021-09-23 DIAGNOSIS — N1831 Chronic kidney disease, stage 3a: Secondary | ICD-10-CM | POA: Diagnosis not present

## 2021-09-24 DIAGNOSIS — R1312 Dysphagia, oropharyngeal phase: Secondary | ICD-10-CM | POA: Diagnosis not present

## 2021-09-24 DIAGNOSIS — N1831 Chronic kidney disease, stage 3a: Secondary | ICD-10-CM | POA: Diagnosis not present

## 2021-09-24 DIAGNOSIS — R293 Abnormal posture: Secondary | ICD-10-CM | POA: Diagnosis not present

## 2021-09-24 DIAGNOSIS — M86171 Other acute osteomyelitis, right ankle and foot: Secondary | ICD-10-CM | POA: Diagnosis not present

## 2021-09-24 DIAGNOSIS — L89614 Pressure ulcer of right heel, stage 4: Secondary | ICD-10-CM | POA: Diagnosis not present

## 2021-09-24 DIAGNOSIS — I69398 Other sequelae of cerebral infarction: Secondary | ICD-10-CM | POA: Diagnosis not present

## 2021-09-24 DIAGNOSIS — I69391 Dysphagia following cerebral infarction: Secondary | ICD-10-CM | POA: Diagnosis not present

## 2021-09-24 LAB — CUP PACEART REMOTE DEVICE CHECK
Battery Impedance: 3793 Ohm
Battery Remaining Longevity: 12 mo
Battery Voltage: 2.69 V
Brady Statistic RV Percent Paced: 96 %
Date Time Interrogation Session: 20230526160518
Implantable Lead Implant Date: 20141208
Implantable Lead Location: 753860
Implantable Lead Model: 4076
Implantable Pulse Generator Implant Date: 20161027
Lead Channel Impedance Value: 0 Ohm
Lead Channel Impedance Value: 383 Ohm
Lead Channel Pacing Threshold Amplitude: 1.125 V
Lead Channel Pacing Threshold Pulse Width: 0.4 ms
Lead Channel Setting Pacing Amplitude: 2.75 V
Lead Channel Setting Pacing Pulse Width: 0.4 ms
Lead Channel Setting Sensing Sensitivity: 2 mV

## 2021-09-25 DIAGNOSIS — I69398 Other sequelae of cerebral infarction: Secondary | ICD-10-CM | POA: Diagnosis not present

## 2021-09-25 DIAGNOSIS — R1312 Dysphagia, oropharyngeal phase: Secondary | ICD-10-CM | POA: Diagnosis not present

## 2021-09-25 DIAGNOSIS — R293 Abnormal posture: Secondary | ICD-10-CM | POA: Diagnosis not present

## 2021-09-25 DIAGNOSIS — I69391 Dysphagia following cerebral infarction: Secondary | ICD-10-CM | POA: Diagnosis not present

## 2021-09-25 DIAGNOSIS — N1831 Chronic kidney disease, stage 3a: Secondary | ICD-10-CM | POA: Diagnosis not present

## 2021-09-26 DIAGNOSIS — I69391 Dysphagia following cerebral infarction: Secondary | ICD-10-CM | POA: Diagnosis not present

## 2021-09-26 DIAGNOSIS — R293 Abnormal posture: Secondary | ICD-10-CM | POA: Diagnosis not present

## 2021-09-26 DIAGNOSIS — I69398 Other sequelae of cerebral infarction: Secondary | ICD-10-CM | POA: Diagnosis not present

## 2021-09-26 DIAGNOSIS — R1312 Dysphagia, oropharyngeal phase: Secondary | ICD-10-CM | POA: Diagnosis not present

## 2021-09-26 DIAGNOSIS — N1831 Chronic kidney disease, stage 3a: Secondary | ICD-10-CM | POA: Diagnosis not present

## 2021-09-26 DIAGNOSIS — E46 Unspecified protein-calorie malnutrition: Secondary | ICD-10-CM | POA: Diagnosis not present

## 2021-09-27 DIAGNOSIS — I69398 Other sequelae of cerebral infarction: Secondary | ICD-10-CM | POA: Diagnosis not present

## 2021-09-27 DIAGNOSIS — L03116 Cellulitis of left lower limb: Secondary | ICD-10-CM | POA: Diagnosis not present

## 2021-09-27 DIAGNOSIS — I69391 Dysphagia following cerebral infarction: Secondary | ICD-10-CM | POA: Diagnosis not present

## 2021-09-27 DIAGNOSIS — N1831 Chronic kidney disease, stage 3a: Secondary | ICD-10-CM | POA: Diagnosis not present

## 2021-09-27 DIAGNOSIS — R293 Abnormal posture: Secondary | ICD-10-CM | POA: Diagnosis not present

## 2021-09-27 DIAGNOSIS — R1312 Dysphagia, oropharyngeal phase: Secondary | ICD-10-CM | POA: Diagnosis not present

## 2021-09-28 DIAGNOSIS — R1312 Dysphagia, oropharyngeal phase: Secondary | ICD-10-CM | POA: Diagnosis not present

## 2021-09-28 DIAGNOSIS — N1831 Chronic kidney disease, stage 3a: Secondary | ICD-10-CM | POA: Diagnosis not present

## 2021-09-28 DIAGNOSIS — R293 Abnormal posture: Secondary | ICD-10-CM | POA: Diagnosis not present

## 2021-09-28 DIAGNOSIS — I69391 Dysphagia following cerebral infarction: Secondary | ICD-10-CM | POA: Diagnosis not present

## 2021-09-28 DIAGNOSIS — I69398 Other sequelae of cerebral infarction: Secondary | ICD-10-CM | POA: Diagnosis not present

## 2021-09-29 DIAGNOSIS — R1312 Dysphagia, oropharyngeal phase: Secondary | ICD-10-CM | POA: Diagnosis not present

## 2021-09-29 DIAGNOSIS — R293 Abnormal posture: Secondary | ICD-10-CM | POA: Diagnosis not present

## 2021-09-29 DIAGNOSIS — I69391 Dysphagia following cerebral infarction: Secondary | ICD-10-CM | POA: Diagnosis not present

## 2021-09-29 DIAGNOSIS — I69398 Other sequelae of cerebral infarction: Secondary | ICD-10-CM | POA: Diagnosis not present

## 2021-09-29 DIAGNOSIS — N1831 Chronic kidney disease, stage 3a: Secondary | ICD-10-CM | POA: Diagnosis not present

## 2021-09-30 DIAGNOSIS — R1312 Dysphagia, oropharyngeal phase: Secondary | ICD-10-CM | POA: Diagnosis not present

## 2021-09-30 DIAGNOSIS — R293 Abnormal posture: Secondary | ICD-10-CM | POA: Diagnosis not present

## 2021-09-30 DIAGNOSIS — I69398 Other sequelae of cerebral infarction: Secondary | ICD-10-CM | POA: Diagnosis not present

## 2021-09-30 DIAGNOSIS — I69391 Dysphagia following cerebral infarction: Secondary | ICD-10-CM | POA: Diagnosis not present

## 2021-09-30 DIAGNOSIS — N1831 Chronic kidney disease, stage 3a: Secondary | ICD-10-CM | POA: Diagnosis not present

## 2021-10-01 DIAGNOSIS — R293 Abnormal posture: Secondary | ICD-10-CM | POA: Diagnosis not present

## 2021-10-01 DIAGNOSIS — I69391 Dysphagia following cerebral infarction: Secondary | ICD-10-CM | POA: Diagnosis not present

## 2021-10-01 DIAGNOSIS — R1312 Dysphagia, oropharyngeal phase: Secondary | ICD-10-CM | POA: Diagnosis not present

## 2021-10-01 DIAGNOSIS — I69398 Other sequelae of cerebral infarction: Secondary | ICD-10-CM | POA: Diagnosis not present

## 2021-10-01 DIAGNOSIS — L89614 Pressure ulcer of right heel, stage 4: Secondary | ICD-10-CM | POA: Diagnosis not present

## 2021-10-01 DIAGNOSIS — Z5181 Encounter for therapeutic drug level monitoring: Secondary | ICD-10-CM | POA: Diagnosis not present

## 2021-10-01 DIAGNOSIS — N1831 Chronic kidney disease, stage 3a: Secondary | ICD-10-CM | POA: Diagnosis not present

## 2021-10-02 NOTE — Addendum Note (Signed)
Addended by: Cheri Kearns A on: 10/02/2021 08:09 AM   Modules accepted: Level of Service

## 2021-10-02 NOTE — Progress Notes (Signed)
Remote pacemaker transmission.   

## 2021-10-03 ENCOUNTER — Telehealth: Payer: Self-pay | Admitting: Family Medicine

## 2021-10-03 DIAGNOSIS — I69391 Dysphagia following cerebral infarction: Secondary | ICD-10-CM | POA: Diagnosis not present

## 2021-10-03 DIAGNOSIS — N1831 Chronic kidney disease, stage 3a: Secondary | ICD-10-CM | POA: Diagnosis not present

## 2021-10-03 DIAGNOSIS — R293 Abnormal posture: Secondary | ICD-10-CM | POA: Diagnosis not present

## 2021-10-03 DIAGNOSIS — I69398 Other sequelae of cerebral infarction: Secondary | ICD-10-CM | POA: Diagnosis not present

## 2021-10-03 DIAGNOSIS — R1312 Dysphagia, oropharyngeal phase: Secondary | ICD-10-CM | POA: Diagnosis not present

## 2021-10-03 NOTE — Telephone Encounter (Signed)
Left message for patient to call back and schedule Medicare Annual Wellness Visit (AWV) either virtually or in office. Left  my jabber number [phone]   Last AWV ;10/06/20  please schedule at anytime with [facility] Nurse Health Advisor 1 or 2    

## 2021-10-04 DIAGNOSIS — R1312 Dysphagia, oropharyngeal phase: Secondary | ICD-10-CM | POA: Diagnosis not present

## 2021-10-04 DIAGNOSIS — I69398 Other sequelae of cerebral infarction: Secondary | ICD-10-CM | POA: Diagnosis not present

## 2021-10-04 DIAGNOSIS — R293 Abnormal posture: Secondary | ICD-10-CM | POA: Diagnosis not present

## 2021-10-04 DIAGNOSIS — N1831 Chronic kidney disease, stage 3a: Secondary | ICD-10-CM | POA: Diagnosis not present

## 2021-10-04 DIAGNOSIS — I69391 Dysphagia following cerebral infarction: Secondary | ICD-10-CM | POA: Diagnosis not present

## 2021-10-05 DIAGNOSIS — Z1621 Resistance to vancomycin: Secondary | ICD-10-CM | POA: Diagnosis not present

## 2021-10-05 DIAGNOSIS — N1831 Chronic kidney disease, stage 3a: Secondary | ICD-10-CM | POA: Diagnosis not present

## 2021-10-05 DIAGNOSIS — Z5181 Encounter for therapeutic drug level monitoring: Secondary | ICD-10-CM | POA: Diagnosis not present

## 2021-10-05 DIAGNOSIS — R1312 Dysphagia, oropharyngeal phase: Secondary | ICD-10-CM | POA: Diagnosis not present

## 2021-10-05 DIAGNOSIS — I69391 Dysphagia following cerebral infarction: Secondary | ICD-10-CM | POA: Diagnosis not present

## 2021-10-05 DIAGNOSIS — R293 Abnormal posture: Secondary | ICD-10-CM | POA: Diagnosis not present

## 2021-10-05 DIAGNOSIS — I69398 Other sequelae of cerebral infarction: Secondary | ICD-10-CM | POA: Diagnosis not present

## 2021-10-08 DIAGNOSIS — M869 Osteomyelitis, unspecified: Secondary | ICD-10-CM | POA: Diagnosis not present

## 2021-10-08 DIAGNOSIS — F329 Major depressive disorder, single episode, unspecified: Secondary | ICD-10-CM | POA: Diagnosis not present

## 2021-10-08 DIAGNOSIS — I872 Venous insufficiency (chronic) (peripheral): Secondary | ICD-10-CM | POA: Diagnosis not present

## 2021-10-08 DIAGNOSIS — L89614 Pressure ulcer of right heel, stage 4: Secondary | ICD-10-CM | POA: Diagnosis not present

## 2021-10-10 DIAGNOSIS — Z5181 Encounter for therapeutic drug level monitoring: Secondary | ICD-10-CM | POA: Diagnosis not present

## 2021-10-10 DIAGNOSIS — E119 Type 2 diabetes mellitus without complications: Secondary | ICD-10-CM | POA: Diagnosis not present

## 2021-10-11 DIAGNOSIS — R1312 Dysphagia, oropharyngeal phase: Secondary | ICD-10-CM | POA: Diagnosis not present

## 2021-10-11 DIAGNOSIS — R293 Abnormal posture: Secondary | ICD-10-CM | POA: Diagnosis not present

## 2021-10-11 DIAGNOSIS — N1831 Chronic kidney disease, stage 3a: Secondary | ICD-10-CM | POA: Diagnosis not present

## 2021-10-11 DIAGNOSIS — I69391 Dysphagia following cerebral infarction: Secondary | ICD-10-CM | POA: Diagnosis not present

## 2021-10-11 DIAGNOSIS — I69398 Other sequelae of cerebral infarction: Secondary | ICD-10-CM | POA: Diagnosis not present

## 2021-10-11 DIAGNOSIS — E119 Type 2 diabetes mellitus without complications: Secondary | ICD-10-CM | POA: Diagnosis not present

## 2021-10-12 DIAGNOSIS — R1312 Dysphagia, oropharyngeal phase: Secondary | ICD-10-CM | POA: Diagnosis not present

## 2021-10-12 DIAGNOSIS — N1831 Chronic kidney disease, stage 3a: Secondary | ICD-10-CM | POA: Diagnosis not present

## 2021-10-12 DIAGNOSIS — I69398 Other sequelae of cerebral infarction: Secondary | ICD-10-CM | POA: Diagnosis not present

## 2021-10-12 DIAGNOSIS — R293 Abnormal posture: Secondary | ICD-10-CM | POA: Diagnosis not present

## 2021-10-12 DIAGNOSIS — I69391 Dysphagia following cerebral infarction: Secondary | ICD-10-CM | POA: Diagnosis not present

## 2021-10-13 DIAGNOSIS — R293 Abnormal posture: Secondary | ICD-10-CM | POA: Diagnosis not present

## 2021-10-13 DIAGNOSIS — I69398 Other sequelae of cerebral infarction: Secondary | ICD-10-CM | POA: Diagnosis not present

## 2021-10-13 DIAGNOSIS — N1831 Chronic kidney disease, stage 3a: Secondary | ICD-10-CM | POA: Diagnosis not present

## 2021-10-13 DIAGNOSIS — R1312 Dysphagia, oropharyngeal phase: Secondary | ICD-10-CM | POA: Diagnosis not present

## 2021-10-13 DIAGNOSIS — I69391 Dysphagia following cerebral infarction: Secondary | ICD-10-CM | POA: Diagnosis not present

## 2021-10-15 DIAGNOSIS — I69398 Other sequelae of cerebral infarction: Secondary | ICD-10-CM | POA: Diagnosis not present

## 2021-10-15 DIAGNOSIS — R1312 Dysphagia, oropharyngeal phase: Secondary | ICD-10-CM | POA: Diagnosis not present

## 2021-10-15 DIAGNOSIS — R293 Abnormal posture: Secondary | ICD-10-CM | POA: Diagnosis not present

## 2021-10-15 DIAGNOSIS — I69391 Dysphagia following cerebral infarction: Secondary | ICD-10-CM | POA: Diagnosis not present

## 2021-10-15 DIAGNOSIS — Z5181 Encounter for therapeutic drug level monitoring: Secondary | ICD-10-CM | POA: Diagnosis not present

## 2021-10-15 DIAGNOSIS — N1831 Chronic kidney disease, stage 3a: Secondary | ICD-10-CM | POA: Diagnosis not present

## 2021-10-15 DIAGNOSIS — L89614 Pressure ulcer of right heel, stage 4: Secondary | ICD-10-CM | POA: Diagnosis not present

## 2021-10-16 DIAGNOSIS — I69391 Dysphagia following cerebral infarction: Secondary | ICD-10-CM | POA: Diagnosis not present

## 2021-10-16 DIAGNOSIS — R293 Abnormal posture: Secondary | ICD-10-CM | POA: Diagnosis not present

## 2021-10-16 DIAGNOSIS — R1312 Dysphagia, oropharyngeal phase: Secondary | ICD-10-CM | POA: Diagnosis not present

## 2021-10-16 DIAGNOSIS — I69398 Other sequelae of cerebral infarction: Secondary | ICD-10-CM | POA: Diagnosis not present

## 2021-10-16 DIAGNOSIS — N1831 Chronic kidney disease, stage 3a: Secondary | ICD-10-CM | POA: Diagnosis not present

## 2021-10-17 DIAGNOSIS — M869 Osteomyelitis, unspecified: Secondary | ICD-10-CM | POA: Diagnosis not present

## 2021-10-18 DIAGNOSIS — N1831 Chronic kidney disease, stage 3a: Secondary | ICD-10-CM | POA: Diagnosis not present

## 2021-10-18 DIAGNOSIS — R293 Abnormal posture: Secondary | ICD-10-CM | POA: Diagnosis not present

## 2021-10-18 DIAGNOSIS — I69391 Dysphagia following cerebral infarction: Secondary | ICD-10-CM | POA: Diagnosis not present

## 2021-10-18 DIAGNOSIS — Z5181 Encounter for therapeutic drug level monitoring: Secondary | ICD-10-CM | POA: Diagnosis not present

## 2021-10-18 DIAGNOSIS — I69398 Other sequelae of cerebral infarction: Secondary | ICD-10-CM | POA: Diagnosis not present

## 2021-10-18 DIAGNOSIS — R1312 Dysphagia, oropharyngeal phase: Secondary | ICD-10-CM | POA: Diagnosis not present

## 2021-10-19 DIAGNOSIS — N1831 Chronic kidney disease, stage 3a: Secondary | ICD-10-CM | POA: Diagnosis not present

## 2021-10-19 DIAGNOSIS — R293 Abnormal posture: Secondary | ICD-10-CM | POA: Diagnosis not present

## 2021-10-19 DIAGNOSIS — R1312 Dysphagia, oropharyngeal phase: Secondary | ICD-10-CM | POA: Diagnosis not present

## 2021-10-19 DIAGNOSIS — I69398 Other sequelae of cerebral infarction: Secondary | ICD-10-CM | POA: Diagnosis not present

## 2021-10-19 DIAGNOSIS — I69391 Dysphagia following cerebral infarction: Secondary | ICD-10-CM | POA: Diagnosis not present

## 2021-10-22 DIAGNOSIS — R1312 Dysphagia, oropharyngeal phase: Secondary | ICD-10-CM | POA: Diagnosis not present

## 2021-10-22 DIAGNOSIS — I69398 Other sequelae of cerebral infarction: Secondary | ICD-10-CM | POA: Diagnosis not present

## 2021-10-22 DIAGNOSIS — I69391 Dysphagia following cerebral infarction: Secondary | ICD-10-CM | POA: Diagnosis not present

## 2021-10-22 DIAGNOSIS — L89614 Pressure ulcer of right heel, stage 4: Secondary | ICD-10-CM | POA: Diagnosis not present

## 2021-10-22 DIAGNOSIS — R293 Abnormal posture: Secondary | ICD-10-CM | POA: Diagnosis not present

## 2021-10-22 DIAGNOSIS — N1831 Chronic kidney disease, stage 3a: Secondary | ICD-10-CM | POA: Diagnosis not present

## 2021-10-23 DIAGNOSIS — R293 Abnormal posture: Secondary | ICD-10-CM | POA: Diagnosis not present

## 2021-10-23 DIAGNOSIS — I69398 Other sequelae of cerebral infarction: Secondary | ICD-10-CM | POA: Diagnosis not present

## 2021-10-23 DIAGNOSIS — R1312 Dysphagia, oropharyngeal phase: Secondary | ICD-10-CM | POA: Diagnosis not present

## 2021-10-23 DIAGNOSIS — N1831 Chronic kidney disease, stage 3a: Secondary | ICD-10-CM | POA: Diagnosis not present

## 2021-10-23 DIAGNOSIS — I69391 Dysphagia following cerebral infarction: Secondary | ICD-10-CM | POA: Diagnosis not present

## 2021-10-23 DIAGNOSIS — Z5181 Encounter for therapeutic drug level monitoring: Secondary | ICD-10-CM | POA: Diagnosis not present

## 2021-10-24 DIAGNOSIS — N1831 Chronic kidney disease, stage 3a: Secondary | ICD-10-CM | POA: Diagnosis not present

## 2021-10-24 DIAGNOSIS — E46 Unspecified protein-calorie malnutrition: Secondary | ICD-10-CM | POA: Diagnosis not present

## 2021-10-24 DIAGNOSIS — I69398 Other sequelae of cerebral infarction: Secondary | ICD-10-CM | POA: Diagnosis not present

## 2021-10-24 DIAGNOSIS — I69391 Dysphagia following cerebral infarction: Secondary | ICD-10-CM | POA: Diagnosis not present

## 2021-10-24 DIAGNOSIS — R1312 Dysphagia, oropharyngeal phase: Secondary | ICD-10-CM | POA: Diagnosis not present

## 2021-10-24 DIAGNOSIS — R293 Abnormal posture: Secondary | ICD-10-CM | POA: Diagnosis not present

## 2021-10-25 DIAGNOSIS — N1831 Chronic kidney disease, stage 3a: Secondary | ICD-10-CM | POA: Diagnosis not present

## 2021-10-25 DIAGNOSIS — R293 Abnormal posture: Secondary | ICD-10-CM | POA: Diagnosis not present

## 2021-10-25 DIAGNOSIS — I69398 Other sequelae of cerebral infarction: Secondary | ICD-10-CM | POA: Diagnosis not present

## 2021-10-25 DIAGNOSIS — R1312 Dysphagia, oropharyngeal phase: Secondary | ICD-10-CM | POA: Diagnosis not present

## 2021-10-25 DIAGNOSIS — I69391 Dysphagia following cerebral infarction: Secondary | ICD-10-CM | POA: Diagnosis not present

## 2021-10-29 DIAGNOSIS — Z5181 Encounter for therapeutic drug level monitoring: Secondary | ICD-10-CM | POA: Diagnosis not present

## 2021-10-29 DIAGNOSIS — L89614 Pressure ulcer of right heel, stage 4: Secondary | ICD-10-CM | POA: Diagnosis not present

## 2021-11-01 DIAGNOSIS — E119 Type 2 diabetes mellitus without complications: Secondary | ICD-10-CM | POA: Diagnosis not present

## 2021-11-05 DIAGNOSIS — L89614 Pressure ulcer of right heel, stage 4: Secondary | ICD-10-CM | POA: Diagnosis not present

## 2021-11-06 DIAGNOSIS — E46 Unspecified protein-calorie malnutrition: Secondary | ICD-10-CM | POA: Diagnosis not present

## 2021-11-06 DIAGNOSIS — M869 Osteomyelitis, unspecified: Secondary | ICD-10-CM | POA: Diagnosis not present

## 2021-11-06 DIAGNOSIS — L89614 Pressure ulcer of right heel, stage 4: Secondary | ICD-10-CM | POA: Diagnosis not present

## 2021-11-06 DIAGNOSIS — I872 Venous insufficiency (chronic) (peripheral): Secondary | ICD-10-CM | POA: Diagnosis not present

## 2021-11-12 DIAGNOSIS — L89614 Pressure ulcer of right heel, stage 4: Secondary | ICD-10-CM | POA: Diagnosis not present

## 2021-11-19 DIAGNOSIS — L89614 Pressure ulcer of right heel, stage 4: Secondary | ICD-10-CM | POA: Diagnosis not present

## 2021-11-19 DIAGNOSIS — L89310 Pressure ulcer of right buttock, unstageable: Secondary | ICD-10-CM | POA: Diagnosis not present

## 2021-11-26 DIAGNOSIS — L89614 Pressure ulcer of right heel, stage 4: Secondary | ICD-10-CM | POA: Diagnosis not present

## 2021-11-26 DIAGNOSIS — L89310 Pressure ulcer of right buttock, unstageable: Secondary | ICD-10-CM | POA: Diagnosis not present

## 2021-11-28 DIAGNOSIS — E039 Hypothyroidism, unspecified: Secondary | ICD-10-CM | POA: Diagnosis not present

## 2021-12-03 DIAGNOSIS — L89152 Pressure ulcer of sacral region, stage 2: Secondary | ICD-10-CM | POA: Diagnosis not present

## 2021-12-03 DIAGNOSIS — L89313 Pressure ulcer of right buttock, stage 3: Secondary | ICD-10-CM | POA: Diagnosis not present

## 2021-12-03 DIAGNOSIS — L89614 Pressure ulcer of right heel, stage 4: Secondary | ICD-10-CM | POA: Diagnosis not present

## 2021-12-28 DEATH — deceased

## 2023-06-27 IMAGING — DX DG WRIST COMPLETE 3+V*R*
5 series · 5 of 5 positions shown · non-contrast
Comparison: None.

CLINICAL DATA: Fall.

EXAM:
RIGHT WRIST - COMPLETE 3+ VIEW

[wrist ap (1 of 2)]
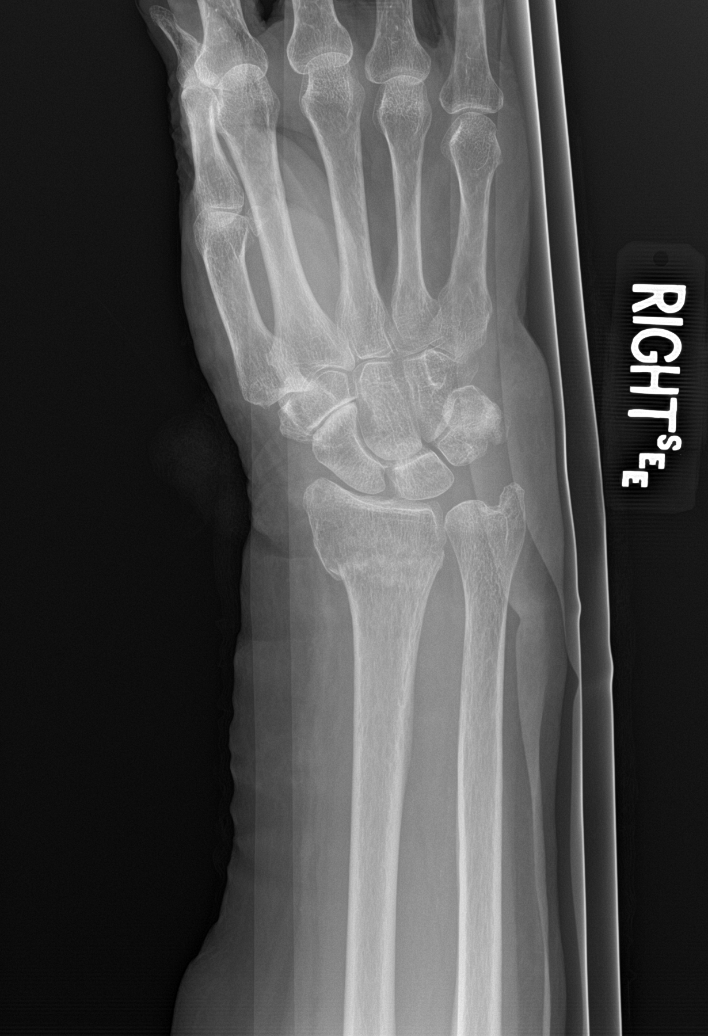

[wrist obl]
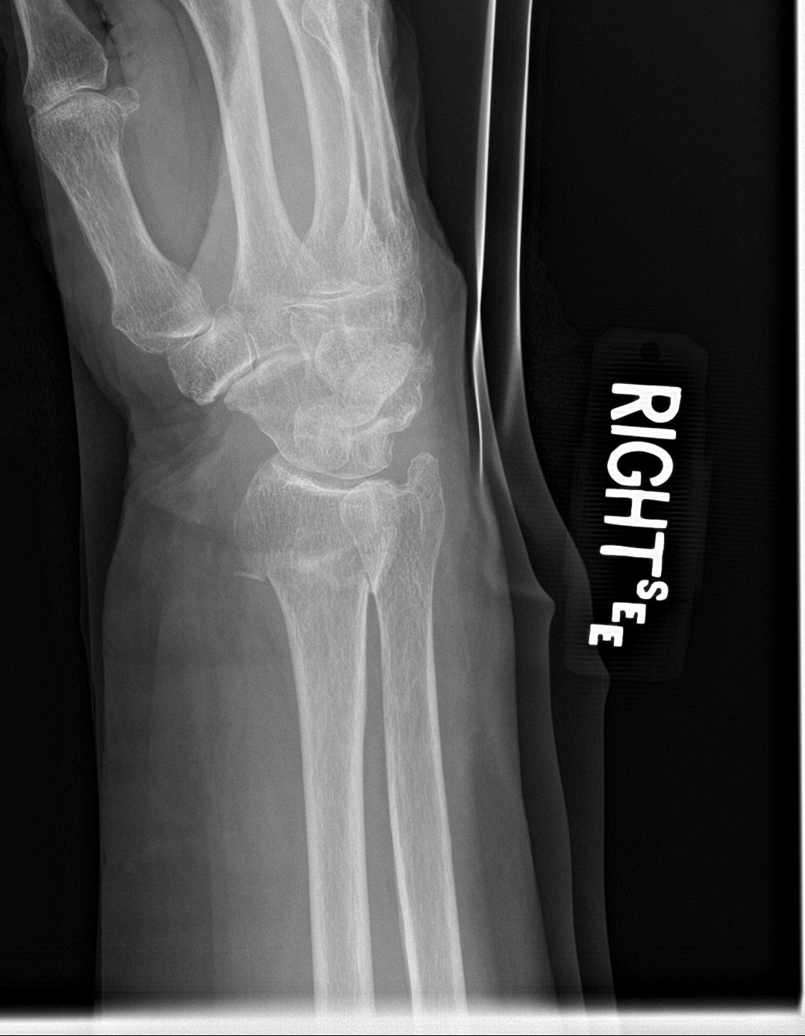

[wrist lat (1 of 2)]
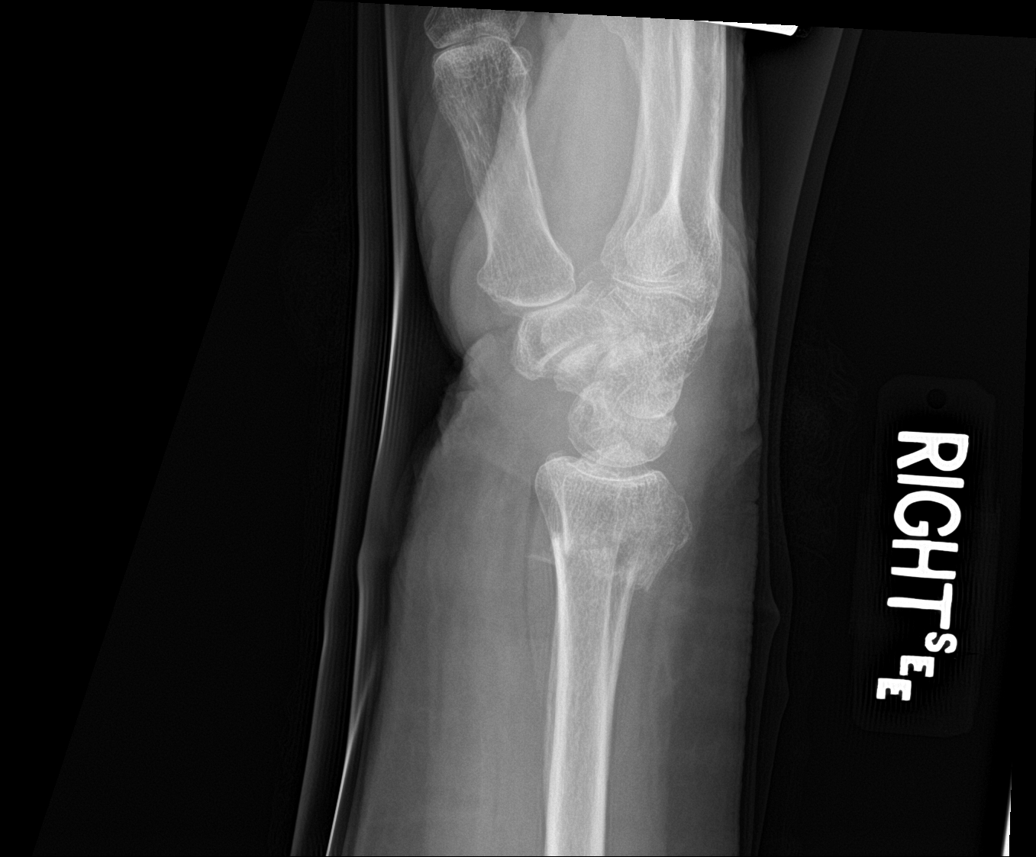

[wrist ap (2 of 2)]
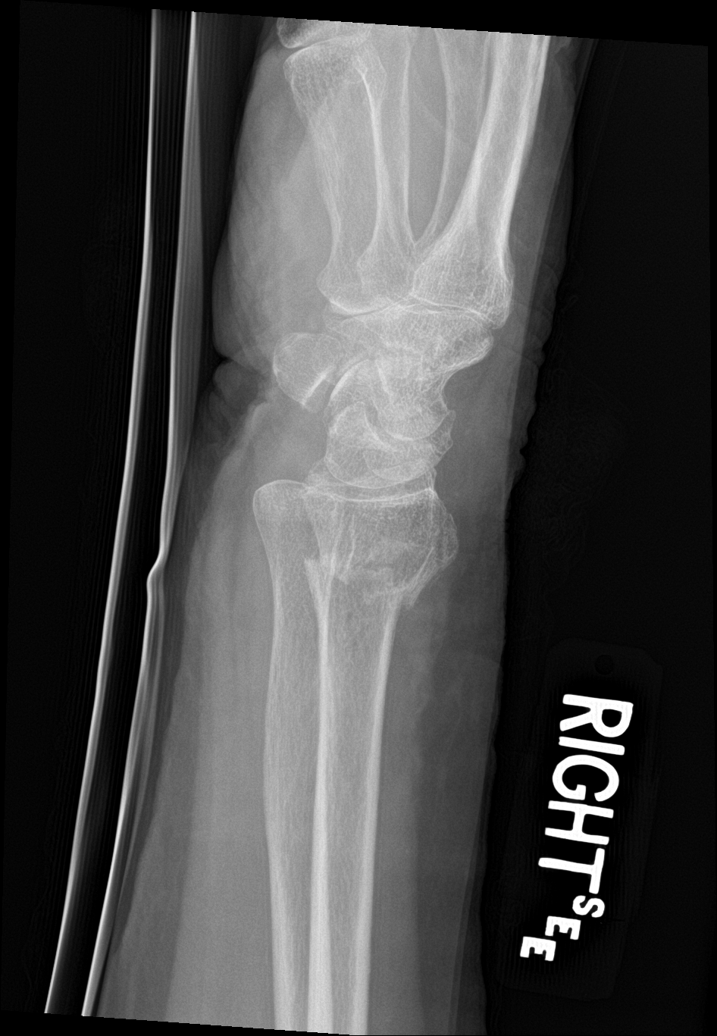

[wrist lat (2 of 2)]
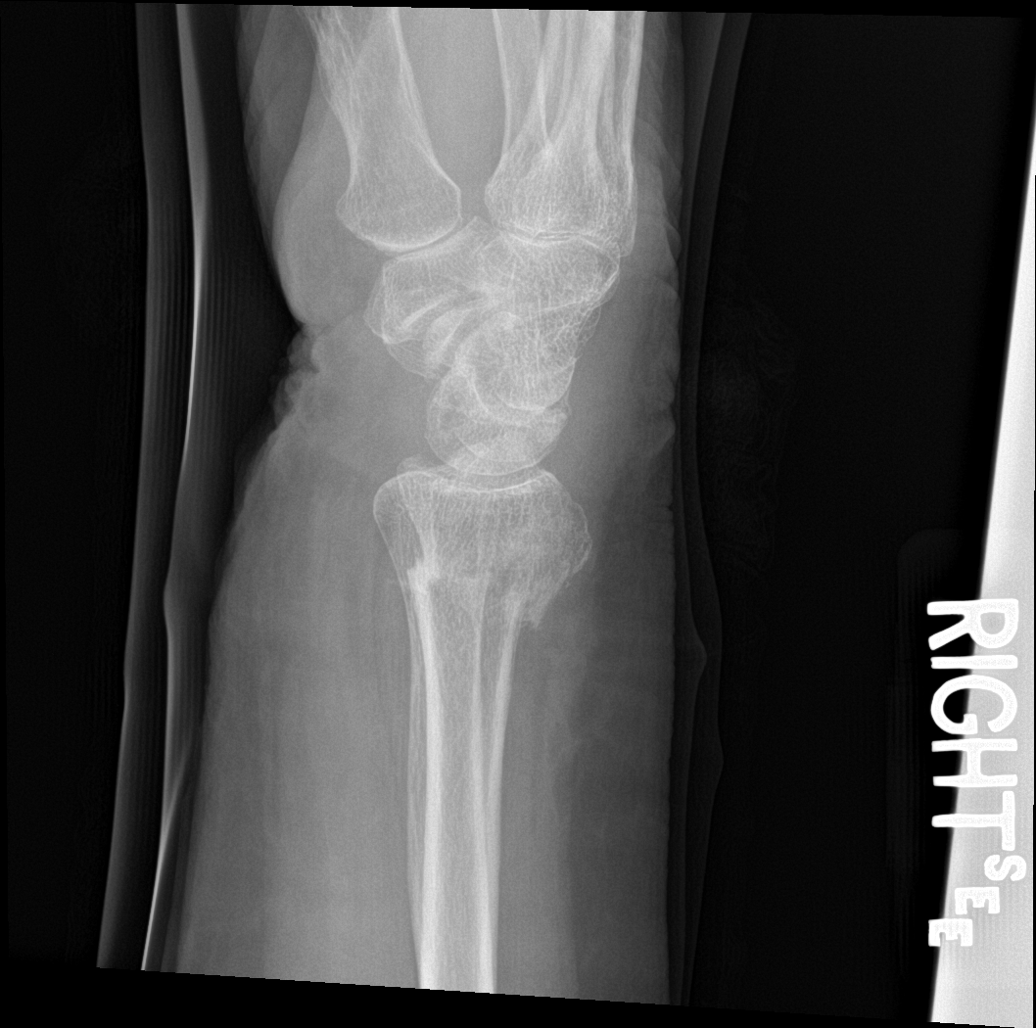

[5 of 5 positions shown; findings below may reference images not displayed]

FINDINGS: Acute transverse fracture through the distal radial metaphysis with
mild posterior displacement measuring 2-3 mm. Mild radial sided
impaction. Located wrist.

STT osteoarthritis.
IMPRESSION: Mildly displaced and impacted distal radius fracture.
